# Patient Record
Sex: Female | Born: 1965 | Race: White | Hispanic: No | Marital: Married | Smoking: Never smoker
Health system: Southern US, Community
[De-identification: ages and names within clinical notes are randomized; demographics above are authoritative.]

## PROBLEM LIST (undated history)

## (undated) DIAGNOSIS — E785 Hyperlipidemia, unspecified: Secondary | ICD-10-CM

## (undated) DIAGNOSIS — T7840XA Allergy, unspecified, initial encounter: Secondary | ICD-10-CM

## (undated) DIAGNOSIS — K219 Gastro-esophageal reflux disease without esophagitis: Secondary | ICD-10-CM

## (undated) DIAGNOSIS — J45909 Unspecified asthma, uncomplicated: Secondary | ICD-10-CM

## (undated) DIAGNOSIS — F419 Anxiety disorder, unspecified: Secondary | ICD-10-CM

## (undated) DIAGNOSIS — E119 Type 2 diabetes mellitus without complications: Secondary | ICD-10-CM

## (undated) DIAGNOSIS — I10 Essential (primary) hypertension: Secondary | ICD-10-CM

## (undated) DIAGNOSIS — D649 Anemia, unspecified: Secondary | ICD-10-CM

## (undated) DIAGNOSIS — U071 COVID-19: Secondary | ICD-10-CM

## (undated) HISTORY — DX: Essential (primary) hypertension: I10

## (undated) HISTORY — DX: Hyperlipidemia, unspecified: E78.5

## (undated) HISTORY — DX: Anemia, unspecified: D64.9

## (undated) HISTORY — DX: Type 2 diabetes mellitus without complications: E11.9

## (undated) HISTORY — DX: Anxiety disorder, unspecified: F41.9

## (undated) HISTORY — DX: Allergy, unspecified, initial encounter: T78.40XA

## (undated) HISTORY — DX: Gastro-esophageal reflux disease without esophagitis: K21.9

## (undated) HISTORY — PX: GANGLION CYST EXCISION: SHX1691

## (undated) HISTORY — DX: Unspecified asthma, uncomplicated: J45.909

---
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
## 1991-07-11 HISTORY — PX: TUBAL LIGATION: SHX77

## 2001-06-30 ENCOUNTER — Emergency Department (HOSPITAL_COMMUNITY): Admission: EM | Admit: 2001-06-30 | Discharge: 2001-06-30 | Payer: Self-pay | Admitting: Emergency Medicine

## 2001-06-30 ENCOUNTER — Encounter: Payer: Self-pay | Admitting: Emergency Medicine

## 2003-06-10 HISTORY — PX: OTHER SURGICAL HISTORY: SHX169

## 2003-07-01 HISTORY — PX: OTHER SURGICAL HISTORY: SHX169

## 2003-10-26 ENCOUNTER — Encounter: Payer: Self-pay | Admitting: Family Medicine

## 2003-10-26 LAB — CONVERTED CEMR LAB
Hgb A1c MFr Bld: 5.8 %
Microalbumin U total vol: 6 mg/L

## 2003-10-29 ENCOUNTER — Encounter: Payer: Self-pay | Admitting: Family Medicine

## 2003-10-29 ENCOUNTER — Other Ambulatory Visit: Admission: RE | Admit: 2003-10-29 | Discharge: 2003-10-29 | Payer: Self-pay | Admitting: Family Medicine

## 2003-10-29 LAB — CONVERTED CEMR LAB: Pap Smear: NORMAL

## 2003-11-10 ENCOUNTER — Ambulatory Visit (HOSPITAL_COMMUNITY): Admission: RE | Admit: 2003-11-10 | Discharge: 2003-11-10 | Payer: Self-pay | Admitting: Family Medicine

## 2004-05-30 ENCOUNTER — Ambulatory Visit: Payer: Self-pay | Admitting: Family Medicine

## 2004-06-10 ENCOUNTER — Ambulatory Visit: Payer: Self-pay | Admitting: Family Medicine

## 2004-07-12 ENCOUNTER — Ambulatory Visit: Payer: Self-pay | Admitting: Family Medicine

## 2004-09-12 ENCOUNTER — Ambulatory Visit: Payer: Self-pay | Admitting: Family Medicine

## 2004-10-26 ENCOUNTER — Ambulatory Visit: Payer: Self-pay | Admitting: Family Medicine

## 2004-10-26 LAB — CONVERTED CEMR LAB
Hgb A1c MFr Bld: 5.1 %
Microalbumin U total vol: 6.4 mg/L

## 2004-11-07 ENCOUNTER — Ambulatory Visit: Payer: Self-pay | Admitting: Family Medicine

## 2004-11-07 ENCOUNTER — Other Ambulatory Visit: Admission: RE | Admit: 2004-11-07 | Discharge: 2004-11-07 | Payer: Self-pay | Admitting: Family Medicine

## 2004-11-15 ENCOUNTER — Ambulatory Visit (HOSPITAL_COMMUNITY): Admission: RE | Admit: 2004-11-15 | Discharge: 2004-11-15 | Payer: Self-pay | Admitting: Family Medicine

## 2004-12-27 ENCOUNTER — Ambulatory Visit: Payer: Self-pay | Admitting: Family Medicine

## 2005-05-09 ENCOUNTER — Ambulatory Visit: Payer: Self-pay | Admitting: Family Medicine

## 2005-05-12 ENCOUNTER — Ambulatory Visit: Payer: Self-pay | Admitting: Family Medicine

## 2005-11-07 ENCOUNTER — Ambulatory Visit: Payer: Self-pay | Admitting: Family Medicine

## 2005-11-07 LAB — CONVERTED CEMR LAB
Hgb A1c MFr Bld: 5.5 %
Microalbumin U total vol: 3.6 mg/L

## 2005-11-08 ENCOUNTER — Encounter: Payer: Self-pay | Admitting: Family Medicine

## 2005-11-08 ENCOUNTER — Other Ambulatory Visit: Admission: RE | Admit: 2005-11-08 | Discharge: 2005-11-08 | Payer: Self-pay | Admitting: Family Medicine

## 2005-11-08 ENCOUNTER — Ambulatory Visit: Payer: Self-pay | Admitting: Family Medicine

## 2005-11-08 LAB — CONVERTED CEMR LAB: Pap Smear: NORMAL

## 2005-12-22 ENCOUNTER — Ambulatory Visit: Payer: Self-pay | Admitting: Family Medicine

## 2006-01-09 ENCOUNTER — Ambulatory Visit (HOSPITAL_COMMUNITY): Admission: RE | Admit: 2006-01-09 | Discharge: 2006-01-09 | Payer: Self-pay | Admitting: Family Medicine

## 2006-02-19 ENCOUNTER — Ambulatory Visit: Payer: Self-pay | Admitting: Family Medicine

## 2006-02-21 ENCOUNTER — Ambulatory Visit: Payer: Self-pay | Admitting: Family Medicine

## 2006-05-16 ENCOUNTER — Ambulatory Visit: Payer: Self-pay | Admitting: Family Medicine

## 2006-05-28 HISTORY — PX: OTHER SURGICAL HISTORY: SHX169

## 2006-10-25 ENCOUNTER — Encounter: Payer: Self-pay | Admitting: Family Medicine

## 2006-10-25 DIAGNOSIS — E119 Type 2 diabetes mellitus without complications: Secondary | ICD-10-CM

## 2006-10-25 DIAGNOSIS — E118 Type 2 diabetes mellitus with unspecified complications: Secondary | ICD-10-CM | POA: Insufficient documentation

## 2006-10-25 DIAGNOSIS — E1165 Type 2 diabetes mellitus with hyperglycemia: Secondary | ICD-10-CM | POA: Insufficient documentation

## 2006-11-13 ENCOUNTER — Ambulatory Visit: Payer: Self-pay | Admitting: Family Medicine

## 2006-11-13 LAB — CONVERTED CEMR LAB
Albumin: 3.8 g/dL (ref 3.5–5.2)
BUN: 11 mg/dL (ref 6–23)
Bilirubin, Direct: 0.1 mg/dL (ref 0.0–0.3)
CO2: 28 meq/L (ref 19–32)
Calcium: 9.1 mg/dL (ref 8.4–10.5)
Creatinine, Ser: 0.7 mg/dL (ref 0.4–1.2)
Creatinine,U: 28.5 mg/dL
HCT: 37.1 % (ref 36.0–46.0)
HDL: 49.6 mg/dL (ref >39.0)
Hemoglobin: 12.9 g/dL (ref 12.0–15.0)
Hgb A1c MFr Bld: 5.5 % (ref 4.6–6.0)
LDL Cholesterol: 89 mg/dL (ref 0–99)
MCV: 86 fL (ref 78.0–100.0)
Microalb, Ur: 0.4 mg/dL (ref 0.0–1.9)
Platelets: 228 10*3/uL (ref 150–400)
RBC: 4.31 M/uL (ref 3.87–5.11)
Total Protein: 6.5 g/dL (ref 6.0–8.3)
Triglycerides: 64 mg/dL (ref 0–149)
VLDL: 13 mg/dL (ref 0–40)
WBC: 5.5 10*3/uL (ref 4.5–10.5)

## 2006-11-16 ENCOUNTER — Ambulatory Visit: Payer: Self-pay | Admitting: Family Medicine

## 2006-11-16 DIAGNOSIS — T7840XA Allergy, unspecified, initial encounter: Secondary | ICD-10-CM

## 2006-11-16 DIAGNOSIS — E0590 Thyrotoxicosis, unspecified without thyrotoxic crisis or storm: Secondary | ICD-10-CM | POA: Insufficient documentation

## 2006-11-16 DIAGNOSIS — J302 Other seasonal allergic rhinitis: Secondary | ICD-10-CM | POA: Insufficient documentation

## 2006-11-23 ENCOUNTER — Emergency Department (HOSPITAL_COMMUNITY): Admission: EM | Admit: 2006-11-23 | Discharge: 2006-11-23 | Payer: Self-pay | Admitting: Family Medicine

## 2006-12-26 ENCOUNTER — Encounter (INDEPENDENT_AMBULATORY_CARE_PROVIDER_SITE_OTHER): Payer: Self-pay | Admitting: *Deleted

## 2007-03-15 ENCOUNTER — Ambulatory Visit: Payer: Self-pay | Admitting: Family Medicine

## 2007-03-22 ENCOUNTER — Telehealth: Payer: Self-pay | Admitting: Family Medicine

## 2007-04-23 ENCOUNTER — Ambulatory Visit: Payer: Self-pay | Admitting: Family Medicine

## 2007-04-30 ENCOUNTER — Ambulatory Visit: Payer: Self-pay | Admitting: General Practice

## 2007-04-30 ENCOUNTER — Encounter: Payer: Self-pay | Admitting: Family Medicine

## 2007-05-07 ENCOUNTER — Encounter: Payer: Self-pay | Admitting: Family Medicine

## 2007-05-09 ENCOUNTER — Ambulatory Visit: Payer: Self-pay | Admitting: Family Medicine

## 2007-05-14 ENCOUNTER — Ambulatory Visit: Payer: Self-pay | Admitting: General Practice

## 2007-05-20 ENCOUNTER — Telehealth (INDEPENDENT_AMBULATORY_CARE_PROVIDER_SITE_OTHER): Payer: Self-pay | Admitting: *Deleted

## 2007-06-13 ENCOUNTER — Ambulatory Visit: Payer: Self-pay | Admitting: Family Medicine

## 2007-06-13 ENCOUNTER — Encounter: Payer: Self-pay | Admitting: Family Medicine

## 2007-06-13 ENCOUNTER — Other Ambulatory Visit: Admission: RE | Admit: 2007-06-13 | Discharge: 2007-06-13 | Payer: Self-pay | Admitting: Family Medicine

## 2007-06-13 LAB — CONVERTED CEMR LAB
Bacteria, UA: 0
Bilirubin Urine: NEGATIVE

## 2007-06-18 ENCOUNTER — Encounter (INDEPENDENT_AMBULATORY_CARE_PROVIDER_SITE_OTHER): Payer: Self-pay | Admitting: *Deleted

## 2007-08-01 ENCOUNTER — Encounter: Payer: Self-pay | Admitting: Family Medicine

## 2007-10-22 ENCOUNTER — Telehealth: Payer: Self-pay | Admitting: Family Medicine

## 2007-11-06 ENCOUNTER — Ambulatory Visit: Payer: Self-pay | Admitting: Family Medicine

## 2007-11-06 ENCOUNTER — Encounter (INDEPENDENT_AMBULATORY_CARE_PROVIDER_SITE_OTHER): Payer: Self-pay | Admitting: Internal Medicine

## 2007-11-07 ENCOUNTER — Encounter (INDEPENDENT_AMBULATORY_CARE_PROVIDER_SITE_OTHER): Payer: Self-pay | Admitting: Internal Medicine

## 2007-11-07 ENCOUNTER — Telehealth (INDEPENDENT_AMBULATORY_CARE_PROVIDER_SITE_OTHER): Payer: Self-pay | Admitting: Internal Medicine

## 2007-11-12 ENCOUNTER — Ambulatory Visit: Payer: Self-pay | Admitting: Family Medicine

## 2007-11-18 ENCOUNTER — Telehealth: Payer: Self-pay | Admitting: Family Medicine

## 2008-02-27 ENCOUNTER — Ambulatory Visit: Payer: Self-pay | Admitting: Family Medicine

## 2008-03-26 ENCOUNTER — Ambulatory Visit: Payer: Self-pay | Admitting: Family Medicine

## 2008-03-27 ENCOUNTER — Telehealth: Payer: Self-pay | Admitting: Family Medicine

## 2008-03-31 ENCOUNTER — Telehealth (INDEPENDENT_AMBULATORY_CARE_PROVIDER_SITE_OTHER): Payer: Self-pay | Admitting: *Deleted

## 2008-06-16 ENCOUNTER — Telehealth: Payer: Self-pay | Admitting: Family Medicine

## 2008-08-03 ENCOUNTER — Encounter: Payer: Self-pay | Admitting: Family Medicine

## 2008-09-16 ENCOUNTER — Ambulatory Visit: Payer: Self-pay | Admitting: Family Medicine

## 2008-09-17 ENCOUNTER — Encounter (INDEPENDENT_AMBULATORY_CARE_PROVIDER_SITE_OTHER): Payer: Self-pay | Admitting: *Deleted

## 2008-09-17 ENCOUNTER — Telehealth (INDEPENDENT_AMBULATORY_CARE_PROVIDER_SITE_OTHER): Payer: Self-pay | Admitting: *Deleted

## 2008-09-23 ENCOUNTER — Encounter: Payer: Self-pay | Admitting: Family Medicine

## 2008-12-08 ENCOUNTER — Ambulatory Visit: Payer: Self-pay | Admitting: Family Medicine

## 2008-12-08 LAB — CONVERTED CEMR LAB
ALT: 18 units/L (ref 0–35)
AST: 22 units/L (ref 0–37)
Albumin: 3.9 g/dL (ref 3.5–5.2)
Alkaline Phosphatase: 55 units/L (ref 39–117)
BUN: 10 mg/dL (ref 6–23)
Basophils Absolute: 0 10*3/uL (ref 0.0–0.1)
Bilirubin, Direct: 0.1 mg/dL (ref 0.0–0.3)
CO2: 27 meq/L (ref 19–32)
Calcium: 8.7 mg/dL (ref 8.4–10.5)
Chloride: 109 meq/L (ref 96–112)
Cholesterol: 168 mg/dL (ref 0–200)
Creatinine, Ser: 0.7 mg/dL (ref 0.4–1.2)
Creatinine,U: 40.5 mg/dL
Eosinophils Absolute: 0.1 10*3/uL (ref 0.0–0.7)
Eosinophils Relative: 1.3 % (ref 0.0–5.0)
GFR calc non Af Amer: 96.36 mL/min (ref >60)
HCT: 32.2 % — ABNORMAL LOW (ref 36.0–46.0)
HDL: 55.5 mg/dL (ref >39.00)
Hgb A1c MFr Bld: 6.3 % (ref 4.6–6.5)
Iron: 22 ug/dL — ABNORMAL LOW (ref 42–145)
LDL Cholesterol: 93 mg/dL (ref 0–99)
Lymphocytes Relative: 20.8 % (ref 12.0–46.0)
Microalb Creat Ratio: 7.4 mg/g (ref 0.0–30.0)
Microalb, Ur: 0.3 mg/dL (ref 0.0–1.9)
Potassium: 4 meq/L (ref 3.5–5.1)
RDW: 16.9 % — ABNORMAL HIGH (ref 11.5–14.6)
Total Protein: 7.1 g/dL (ref 6.0–8.3)
Triglycerides: 97 mg/dL (ref 0.0–149.0)
Vitamin B-12: 339 pg/mL (ref 211–911)

## 2008-12-10 ENCOUNTER — Ambulatory Visit: Payer: Self-pay | Admitting: Family Medicine

## 2008-12-10 ENCOUNTER — Other Ambulatory Visit: Admission: RE | Admit: 2008-12-10 | Discharge: 2008-12-10 | Payer: Self-pay | Admitting: Family Medicine

## 2008-12-10 ENCOUNTER — Encounter: Payer: Self-pay | Admitting: Family Medicine

## 2008-12-14 ENCOUNTER — Encounter (INDEPENDENT_AMBULATORY_CARE_PROVIDER_SITE_OTHER): Payer: Self-pay | Admitting: *Deleted

## 2008-12-16 ENCOUNTER — Encounter: Payer: Self-pay | Admitting: Family Medicine

## 2008-12-16 ENCOUNTER — Ambulatory Visit: Payer: Self-pay | Admitting: Family Medicine

## 2008-12-16 LAB — HM MAMMOGRAPHY

## 2008-12-28 ENCOUNTER — Encounter: Payer: Self-pay | Admitting: Family Medicine

## 2008-12-28 ENCOUNTER — Ambulatory Visit: Payer: Self-pay | Admitting: Family Medicine

## 2009-01-05 ENCOUNTER — Encounter (INDEPENDENT_AMBULATORY_CARE_PROVIDER_SITE_OTHER): Payer: Self-pay | Admitting: *Deleted

## 2009-02-11 ENCOUNTER — Telehealth: Payer: Self-pay | Admitting: Family Medicine

## 2009-03-04 ENCOUNTER — Ambulatory Visit: Payer: Self-pay | Admitting: Family Medicine

## 2009-03-04 DIAGNOSIS — R609 Edema, unspecified: Secondary | ICD-10-CM | POA: Insufficient documentation

## 2009-06-10 ENCOUNTER — Ambulatory Visit: Payer: Self-pay | Admitting: Family Medicine

## 2009-06-10 DIAGNOSIS — E559 Vitamin D deficiency, unspecified: Secondary | ICD-10-CM | POA: Insufficient documentation

## 2009-06-11 LAB — CONVERTED CEMR LAB: Vit D, 25-Hydroxy: 37 ng/mL (ref 30–89)

## 2009-06-14 ENCOUNTER — Ambulatory Visit: Payer: Self-pay | Admitting: Family Medicine

## 2009-08-02 ENCOUNTER — Ambulatory Visit: Payer: Self-pay | Admitting: Family Medicine

## 2009-08-02 LAB — CONVERTED CEMR LAB
Creatinine, Ser: 0.7 mg/dL (ref 0.4–1.2)
Potassium: 3.5 meq/L (ref 3.5–5.1)

## 2009-10-14 ENCOUNTER — Telehealth: Payer: Self-pay | Admitting: Family Medicine

## 2009-12-13 ENCOUNTER — Ambulatory Visit: Payer: Self-pay | Admitting: Family Medicine

## 2009-12-13 LAB — CONVERTED CEMR LAB
Albumin: 4 g/dL (ref 3.5–5.2)
Basophils Absolute: 0 10*3/uL (ref 0.0–0.1)
Basophils Relative: 0.4 % (ref 0.0–3.0)
Bilirubin, Direct: 0.1 mg/dL (ref 0.0–0.3)
CO2: 27 meq/L (ref 19–32)
Creatinine, Ser: 0.7 mg/dL (ref 0.4–1.2)
Eosinophils Absolute: 0.1 10*3/uL (ref 0.0–0.7)
Free T4: 0.9 ng/dL (ref 0.6–1.6)
HCT: 36.5 % (ref 36.0–46.0)
HDL: 46 mg/dL (ref >39.00)
Lymphocytes Relative: 24.5 % (ref 12.0–46.0)
Lymphs Abs: 1.5 10*3/uL (ref 0.7–4.0)
MCV: 89.6 fL (ref 78.0–100.0)
Microalb Creat Ratio: 0.7 mg/g (ref 0.0–30.0)
Monocytes Relative: 9 % (ref 3.0–12.0)
Neutro Abs: 3.9 10*3/uL (ref 1.4–7.7)
Neutrophils Relative %: 64.7 % (ref 43.0–77.0)
Phosphorus: 4.3 mg/dL (ref 2.3–4.6)
RBC: 4.08 M/uL (ref 3.87–5.11)
Sodium: 141 meq/L (ref 135–145)
TSH: 1 microintl units/mL (ref 0.35–5.50)
Total CHOL/HDL Ratio: 3

## 2009-12-14 LAB — CONVERTED CEMR LAB: Vit D, 25-Hydroxy: 54 ng/mL (ref 30–89)

## 2009-12-27 ENCOUNTER — Ambulatory Visit: Payer: Self-pay | Admitting: Family Medicine

## 2009-12-27 ENCOUNTER — Other Ambulatory Visit: Admission: RE | Admit: 2009-12-27 | Discharge: 2009-12-27 | Payer: Self-pay | Admitting: Family Medicine

## 2009-12-27 DIAGNOSIS — N852 Hypertrophy of uterus: Secondary | ICD-10-CM | POA: Insufficient documentation

## 2009-12-27 DIAGNOSIS — R55 Syncope and collapse: Secondary | ICD-10-CM | POA: Insufficient documentation

## 2009-12-27 LAB — CONVERTED CEMR LAB: Pap Smear: NORMAL

## 2009-12-30 ENCOUNTER — Encounter (INDEPENDENT_AMBULATORY_CARE_PROVIDER_SITE_OTHER): Payer: Self-pay | Admitting: *Deleted

## 2010-01-21 ENCOUNTER — Encounter: Admission: RE | Admit: 2010-01-21 | Discharge: 2010-01-21 | Payer: Self-pay | Admitting: Family Medicine

## 2010-02-15 ENCOUNTER — Encounter (INDEPENDENT_AMBULATORY_CARE_PROVIDER_SITE_OTHER): Payer: Self-pay | Admitting: *Deleted

## 2010-05-03 ENCOUNTER — Ambulatory Visit: Payer: Self-pay | Admitting: General Practice

## 2010-07-31 ENCOUNTER — Encounter: Payer: Self-pay | Admitting: Family Medicine

## 2010-08-11 NOTE — Letter (Signed)
Summary: Results Follow up Letter  [place] at [facility]  [address] [place], [place] [address]   Phone: [phone]  Fax: [phone]    12/30/2009 MRN: [id number]  Kelly Reed [address] [place], [place]  [address]  Dear [name],  The following are the results of your recent test(s):  Test         Result    Pap Smear:        Normal _X____  Not Normal _____ Comments: Please repeat in one year. ______________________________________________________ Cholesterol: LDL(Bad cholesterol):         Your goal is less than:         HDL (Good cholesterol):       Your goal is more than: Comments:  ______________________________________________________ Mammogram:        Normal _____  Not Normal _____ Comments:  ___________________________________________________________________ Hemoccult:        Normal _____  Not normal _______ Comments:    _____________________________________________________________________ Other Tests:    We routinely do not discuss normal results over the telephone.  If you desire a copy of the results, or you have any questions about this information we can discuss them at your next office visit.   Sincerely,    Laurita Quint, MD

## 2010-08-11 NOTE — Progress Notes (Signed)
Summary: Rx Buspirone  Phone Note Refill Request Call back at [phone] Message from:  [facility] on October 14, 2009 8:47 AM  Refills Requested: Medication #1:  BUSPIRONE HCL 15 MG TABS 1/2 tab by mouth two times a day   Last Refilled: 09/11/2009 Received faxed refill request, please advise.   Method Requested: Electronic Initial call taken by: Linde Gillis CMA Duncan Dull),  October 14, 2009 8:47 AM  Follow-up for Phone Call        Rx called to pharmacy Follow-up by: Linde Gillis CMA Duncan Dull),  October 14, 2009 10:31 AM    Prescriptions: BUSPIRONE HCL 15 MG TABS (BUSPIRONE HCL) 1/2 tab by mouth two times a day  #30 x 6   Entered and Authorized by:   Shaune Leeks MD   Signed by:   Shaune Leeks MD on 10/14/2009   Method used:   Telephoned to ...       [facility]* (retail)       [address]       [place], [place]  [address]       Ph: [phone]       Fax: [phone]   RxID:   [id number]

## 2010-08-11 NOTE — Assessment & Plan Note (Signed)
Summary: cpx w/pap/rbh   Vital Signs:  Patient profile:   45 year old female Weight:      229.25 pounds BMI:     33.01 Temp:     98.7 degrees F oral Pulse rate:   92 / minute Pulse rhythm:   regular BP sitting:   120 / 88  (left arm) Cuff size:   large  Vitals Entered By: Sydell Axon LPN (December 27, 2009 10:53 AM) CC: 30 Minute checkup with pap   History of Present Illness: Pt here with Comp Exam. She deals with it daily. She gets stress from work and family. She had diarrhea all week last week. Then also had bad Friday  Has PA at work and suggested Allegra D.   Preventive Screening-Counseling & Management  Alcohol-Tobacco     Alcohol drinks/day: <1     Alcohol type: beer 1-2 a week     Smoking Status: never     Passive Smoke Exposure: yes  Caffeine-Diet-Exercise     Caffeine use/day: 3     Does Patient Exercise: yes     Type of exercise: walks     Times/week: 7  Problems Prior to Update: 1)  Unspecified Vitamin D Deficiency  (ICD-268.9) 2)  Sinusitis, Chronic w/ Allergies  (ICD-473.9) 3)  Edema  (ICD-782.3) 4)  Anxiety State, Unspec  (ICD-300.00) 5)  Other Screening Mammogram  (ICD-V76.12) 6)  Health Maintenance Exam  (ICD-V70.0) 7)  Allergy  (ICD-995.3) 8)  Hyperthyroidism  (ICD-242.90) 9)  Sprain/strain, R Shoulder/arm Nos  (ICD-840.9) 10)  Anemia, Iron Deficiency Sec. Heavy Flow  (ICD-280.9) 11)  Breast Cancer, Family Hx, Mother Died Age 37 Breast Ca.  (ICD-V16.3) 12)  Hypercholesterolemia, Ldl 110  (ICD-272.0) 13)  Diabetes Mellitus, Type II  (ICD-250.00)  Medications Prior to Update: 1)  Pravachol 40 Mg Tabs (Pravastatin Sodium) .... Take 1 Tab By Mouth At Bedtime 2)  Sertraline Hcl 50 Mg Tabs (Sertraline Hcl) .Marland Kitchen.. 1 Tablet Once Daily By Mouth 3)  Feosol 45 Mg  Tabs (Iron) .... 3 Pills At Bedtime By Mouth 4)  Stool Softener 100 Mg  Caps (Docusate Sodium) .... 3 Pills At Night By Mouth 5)  Zyrtec Allergy 10 Mg  Tabs (Cetirizine Hcl) .... Take 1  Tablet By Mouth Once A Day As Needed 6)  Triamcinolone Acetonide 0.1 %  Crea (Triamcinolone Acetonide) .... Apply To Affected Area Two Times As Needed 7)  Metformin Hcl 500 Mg Tabs (Metformin Hcl) .Marland Kitchen.. 1 Tab  By Mouth Daily 8)  Flonase 50 Mcg/act Susp (Fluticasone Propionate) .... One Squirt Each Nosrtil Once Daily 9)  Buspirone Hcl 15 Mg Tabs (Buspirone Hcl) .... 1/2 Tab By Mouth Two Times A Day 10)  Vitamin D3 1000 Unit Caps (Cholecalciferol) .... 2 Capsules At Bedtime 11)  Flexeril 5 Mg Tabs (Cyclobenzaprine Hcl) .... Take 1/2 To 1 By Mouth Three Times A Day As Needed 12)  Meclizine Hcl 25 Mg Tabs (Meclizine Hcl) .... Take 1 By Mouth Evey 6 Hours As Needed 13)  Lisinopril 2.5 Mg Tabs (Lisinopril) .... One Tab By Mouth At Night  Allergies: No Known Drug Allergies  Past History:  Past Medical History: Last updated: 10/25/2006 Diabetes mellitus, type II  Past Surgical History: Last updated: 10/25/2006 NSVD x 2 BTL 06/1992 Abd. U/S negative 12/04 Hepatobiliary- ? biliary dyskinesia 07/01/2003 Surg. eval. lap cholecstectomy- deferred by pt. 05/28/2006  Family History: Last updated: 12/10/2008 Father dec Suicide prior to pt's birth Mother dec Breast Ca Brother A 47  Social History: Last updated: 10/25/2006 Occupation: [employer] Dept[employer] Married  2 Sons Never Smoked Alcohol use-no Drug use-no  Risk Factors: Alcohol Use: <1 (12/27/2009) Caffeine Use: 3 (12/27/2009) Exercise: yes (12/27/2009)  Risk Factors: Smoking Status: never (12/27/2009) Passive Smoke Exposure: yes (12/27/2009)  Social History: Caffeine use/day:  3  Review of Systems General:  Denies chills, fatigue, fever, sweats, weakness, and weight loss. Eyes:  Denies blurring, eye pain, and itching. ENT:  Denies decreased hearing, earache, and ringing in ears; fluid in ears recently.. CV:  Denies bluish discoloration of lips or nails, chest pain or discomfort, difficulty breathing at night,  difficulty breathing while lying down, fainting, fatigue, leg cramps with exertion, lightheadness, near fainting, palpitations, shortness of breath with exertion, swelling of feet, swelling of hands, and weight gain. Resp:  Denies cough, shortness of breath, and wheezing. GI:  Complains of diarrhea; denies abdominal pain, bloody stools, change in bowel habits, dark tarry stools, hemorrhoids, indigestion, nausea, vomiting, and vomiting blood. GU:  Denies discharge, nocturia, and urinary frequency. MS:  Complains of low back pain and muscle aches; denies joint pain and stiffness. Derm:  Complains of rash; denies dryness and itching; rhus. Neuro:  Denies numbness, poor balance, tingling, and tremors.  Physical Exam  General:  Well-developed,well-nourished,in no acute distress; alert,appropriate and cooperative throughout examination Head:  Normocephalic and atraumatic without obvious abnormalities. No apparent alopecia or balding. Mild max sinus pressure with palpation. Sinuses minimally tender. Eyes:  Conjunctiva clear bilaterally.  Ears:  External ear exam shows no significant lesions or deformities.  Otoscopic examination reveals clear canals, tympanic membranes are intact bilaterally without bulging, retraction, inflammation or discharge. Hearing is grossly normal bilaterally. NML LR. Nose:  External nasal examination shows no deformity or inflammation. Nasal mucosa are pink and moist without lesions or exudates. Mouth:  Oral mucosa and oropharynx without lesions or exudates.  Teeth in good repair. Neck:  No deformities, masses, or tenderness noted. Chest Wall:  No deformities, masses, or tenderness noted. Breasts:  No mass, nodules, thickening, tenderness, bulging, retraction, inflamation, nipple discharge or skin changes noted.   Lungs:  Normal respiratory effort, chest expands symmetrically. Lungs are clear to auscultation, no crackles or wheezes. Heart:  Normal rate and regular rhythm. S1  and S2 normal without gallop, murmur, click, rub or other extra sounds. Abdomen:  Bowel sounds positive,abdomen soft and non-tender without masses, organomegaly or hernias noted except mild tympany with mild tenderness in LLQ. Rectal:  Neglected to do this year. Genitalia:  Pelvic Exam:        External: normal female genitalia without lesions or masses        Vagina: normal without lesions or masses        Cervix: normal without lesions or masses        Adnexa: normal bimanual exam without masses or fullness        Uterus: Feels full, slightly enlarged and left sided by palpation        Pap smear: performed Msk:  No deformity or scoliosis noted of thoracic or lumbar spine.  Mild tenderness to direct deep palpation in L/S vertebral area. Pulses:  R and L carotid,radial,femoral,dorsalis pedis and posterior tibial pulses are full and equal bilaterally Extremities:  No clubbing, cyanosis, edema, or deformity noted with normal full range of motion of all joints except slightly decr ROM of R hip with occas catching and pain with prolonged positions. No edema today of ankles or feet.  Muscle spasm of legs bilat with  lordotic position. Neurologic:  No cranial nerve deficits noted. Station and gait are normal. Sensory, motor and coordinative functions appear intact. Skin:  Intact without suspicious lesions or rashes except vessicular linear minimally red rash along the left middle palm major wrinkle line. Cervical Nodes:  No lymphadenopathy noted Axillary Nodes:  No palpable lymphadenopathy Inguinal Nodes:  No significant adenopathy Psych:  Cognition and judgment appear intact. Alert and cooperative with normal attention span and concentration. No apparent delusions, illusions, hallucinations. Good affect and eye contact.   Impression & Recommendations:  Problem # 1:  HEALTH MAINTENANCE EXAM (ICD-V70.0) Assessment Comment Only Discussed diet, exercise, anxiety, health maintenance and  lifestyle.  Problem # 2:  UTERINE ENLARGEMENT (ICD-621.2) Assessment: New Will get U/s to assess. Orders: Radiology Referral (Radiology)  Problem # 3:  UNSPECIFIED VITAMIN D DEFICIENCY (ICD-268.9) Assessment: Improved Good nos on current replacement, continue.  Problem # 4:  SINUSITIS, CHRONIC W/ ALLERGIES (ICD-473.9) Assessment: Deteriorated  Again bothering her and need to improve her sxs and medication coverage. Does not appear infectious. Will refer to Allergy for eval and trmt. Add Astelin via sample fotr one month. Her updated medication list for this problem includes:    Flonase 50 Mcg/act Susp (Fluticasone propionate) ..... One squirt each nosrtil once daily    Allegra-d 24 Hour 180-240 Mg Xr24h-tab (Fexofenadine-pseudoephedrine) .Marland Kitchen... Take one by mouth daily as needed  Problem # 5:  RHUS DERMATITIS (ICD-692.6) Assessment: New Start Clobetasol, apply two times a day. The following medications were removed from the medication list:    Zyrtec Allergy 10 Mg Tabs (Cetirizine hcl) .Marland Kitchen... Take 1 tablet by mouth once a day as needed Her updated medication list for this problem includes:    Triamcinolone Acetonide 0.1 % Crea (Triamcinolone acetonide) .Marland Kitchen... Apply to affected area two times as needed    Clobetasol Propionate 0.05 % Oint (Clobetasol propionate) .Marland Kitchen... Apply to affected area two times a day  Problem # 6:  EDEMA (ICD-782.3) Assessment: Unchanged Stable.  Problem # 7:  ANXIETY STATE, UNSPEC (ICD-300.00)  Get back on Sertraline 50mg  a day and increase Buspirone to 1/2 three times a day from two times a day. RTC 3 mos if sxs no better. Her updated medication list for this problem includes:    Sertraline Hcl 50 Mg Tabs (Sertraline hcl) .Marland Kitchen... 1 tablet once daily by mouth    Buspirone Hcl 15 Mg Tabs (Buspirone hcl) .Marland Kitchen... 1/2 tab by mouth three  times a day  Discussed medication use.   Problem # 8:  HYPERTHYROIDISM (ICD-242.90) Assessment: Unchanged Stable, cont curr  dose. Labs Reviewed: TSH: 1.00 (12/13/2009)     Problem # 9:  ANEMIA, IRON DEFICIENCY SEC. HEAVY FLOW (ICD-280.9) Assessment: Unchanged Stable. Her updated medication list for this problem includes:    Feosol 45 Mg Tabs (Iron) .Marland KitchenMarland KitchenMarland KitchenMarland Kitchen 3 pills at bedtime by mouth  Hgb: 12.8 (12/13/2009)   Hct: 36.5 (12/13/2009)   Platelets: 212.0 (12/13/2009) RBC: 4.08 (12/13/2009)   RDW: 13.3 (12/13/2009)   WBC: 6.0 (12/13/2009) MCV: 89.6 (12/13/2009)   MCHC: 35.1 (12/13/2009) Ferritin: 7.4 (11/13/2006) Iron: 22 (12/08/2008)   B12: 339 (12/08/2008)   TSH: 1.00 (12/13/2009)  Problem # 10:  HYPERCHOLESTEROLEMIA, LDL 110 (ICD-272.0) Assessment: Unchanged Good control, cont Prava. Her updated medication list for this problem includes:    Pravachol 40 Mg Tabs (Pravastatin sodium) .Marland Kitchen... Take 1 tab by mouth at bedtime  Labs Reviewed: SGOT: 20 (12/13/2009)   SGPT: 19 (12/13/2009)   HDL:46.00 (12/13/2009), 55.50 (12/08/2008)  LDL:83 (12/13/2009), 93 (12/08/2008)  Chol:157 (12/13/2009), 168 (12/08/2008)  Trig:142.0 (12/13/2009), 97.0 (12/08/2008)  Problem # 11:  DIABETES MELLITUS, TYPE II (ICD-250.00) Assessment: Unchanged Stable. Her updated medication list for this problem includes:    Metformin Hcl 500 Mg Tabs (Metformin hcl) .Marland Kitchen... 1 tab  by mouth daily    Lisinopril 2.5 Mg Tabs (Lisinopril) ..... One tab by mouth at night  Labs Reviewed: Creat: 0.7 (12/13/2009)   Microalbumin: 3.6 (11/07/2005)  Last Eye Exam: normal (08/03/2008) Reviewed HgBA1c results: 6.4 (12/13/2009)  5.8 (06/10/2009)  Complete Medication List: 1)  Pravachol 40 Mg Tabs (Pravastatin sodium) .... Take 1 tab by mouth at bedtime 2)  Sertraline Hcl 50 Mg Tabs (Sertraline hcl) .Marland Kitchen.. 1 tablet once daily by mouth 3)  Feosol 45 Mg Tabs (Iron) .... 3 pills at bedtime by mouth 4)  Stool Softener 100 Mg Caps (docusate Sodium)  .... 3 pills at night by mouth 5)  Triamcinolone Acetonide 0.1 % Crea (Triamcinolone acetonide) .... Apply to  affected area two times as needed 6)  Metformin Hcl 500 Mg Tabs (Metformin hcl) .Marland Kitchen.. 1 tab  by mouth daily 7)  Flonase 50 Mcg/act Susp (Fluticasone propionate) .... One squirt each nosrtil once daily 8)  Buspirone Hcl 15 Mg Tabs (Buspirone hcl) .... 1/2 tab by mouth three  times a day 9)  Vitamin D3 1000 Unit Caps (Cholecalciferol) .... 2 capsules at bedtime 10)  Flexeril 5 Mg Tabs (Cyclobenzaprine hcl) .... Take 1/2 to 1 by mouth three times a day as needed 11)  Meclizine Hcl 25 Mg Tabs (Meclizine hcl) .... Take 1 by mouth evey 6 hours as needed 12)  Lisinopril 2.5 Mg Tabs (Lisinopril) .... One tab by mouth at night 13)  Allegra-d 24 Hour 180-240 Mg Xr24h-tab (Fexofenadine-pseudoephedrine) .... Take one by mouth daily as needed 14)  Clobetasol Propionate 0.05 % Oint (Clobetasol propionate) .... Apply to affected area two times a day  Patient Instructions: 1)  Refer to allergist. 2)  Refer for U/S for uterus Prescriptions: CLOBETASOL PROPIONATE 0.05 % OINT (CLOBETASOL PROPIONATE) apply to affected area two times a day  #60 gms x 0   Entered and Authorized by:   Shaune Leeks MD   Signed by:   Shaune Leeks MD on 12/27/2009   Method used:   Electronically to        [facility]* (retail)       [address]       [place], [place]  [address]       Ph: [phone]       Fax: [phone]   RxID:   [id number]   Current Allergies (reviewed today): No known allergies

## 2010-08-11 NOTE — Letter (Signed)
Summary: Kelly Reed letter  [place] at [facility]  [address] [place], [place] [address]   Phone: [phone]  Fax: [phone]       02/15/2010 MRN: [id number]  Kelly Reed [address] [place], [place]  [address]  Dear Ms. [facility] [place], and [place] announce the retirement of Arta Silence, M.D., from full-time practice at the [facility] office effective January 06, 2010 and his plans of returning part-time.  It is important to Dr. Hetty Ely and to our practice that you understand that [facility] has seven physicians in our office for your health care needs.  We will continue to offer the same exceptional care that you have today.    Dr. Hetty Ely has spoken to many of you about his plans for retirement and returning part-time in the fall.   We will continue to work with you through the transition to schedule appointments for you in the office and meet the high standards that [place] is committed to.   Again, it is with great pleasure that we share the news that Dr. Hetty Ely will return to [facility] in October of 2011 with a reduced schedule.    If you have any questions, or would like to request an appointment with one of our physicians, please call us at [phone] and press the option for Scheduling an appointment.  We take pleasure in providing you with excellent patient care and look forward to seeing you at your next office visit.  Our [facility] Physicians are:  Tillman Abide, M.D. Laurita Quint, M.D. Roxy Manns, M.D. Kerby Nora, M.D. Hannah Beat, M.D. Ruthe Mannan, M.D. We proudly welcomed Raechel Ache, M.D. and Eustaquio Boyden, M.D. to the practice in July/August 2011.  Sincerely,   [facility]

## 2010-11-25 NOTE — Letter (Signed)
May 16, 2006     [facility]  [address]  [place], [place]  [address]    RE:  Kelly Reed, Kelly Reed  MRN:  [id number]  /  DOB:  July 29, 1965   To Whom It May Concern:   This is to introduce Kelly Reed, a 45 year old white female who is having  low-grade fevers since she started her last period, which was approximately  1 month ago.  Has not felt sick, in general has felt pretty well otherwise.  Had right upper quadrant pain in December 2004, seen acutely by an acute  care clinic here, who did an abdominal ultrasound which was normal, followed  that with a HIDA scan, which showed sludge.  By watching her diet and  avoiding fatty foods, she has avoided symptoms since that time.  I am  wondering if she could have some cholestatic build-up that could be causing  her fevers.  The patient otherwise is healthy and her exam is normal.   She has high cholesterol since 2005, diabetes since December 2004, and iron-  deficiency anemia due to extremely heavy periods since onset as a 10-year-  old.   Medicines include Fortamet 500 mg a day, iron tablets 1-3 times a day, stool  softeners, generic Allegra 180 mg a day as needed, pravastatin 40 mg at  night, and Zoloft 50 mg a day.  She has  also been prescribed Xanax for her anxiety, which was treated initially  approximately 1 month ago, and has not used any as of this point.         I appreciate your seeing her.  She has no known drug allergies.  I look  forward to your evaluation.    Sincerely,     ______________________________  Arta Silence, MD    RNS/MedQ  DD: 05/16/2006  DT: 05/17/2006  Job #: [id number]

## 2011-01-14 ENCOUNTER — Encounter: Payer: Self-pay | Admitting: Family Medicine

## 2011-01-20 ENCOUNTER — Encounter: Payer: Self-pay | Admitting: Family Medicine

## 2011-01-20 ENCOUNTER — Ambulatory Visit (INDEPENDENT_AMBULATORY_CARE_PROVIDER_SITE_OTHER): Payer: BC Managed Care – PPO | Admitting: Family Medicine

## 2011-01-20 DIAGNOSIS — J329 Chronic sinusitis, unspecified: Secondary | ICD-10-CM

## 2011-01-20 MED ORDER — AZITHROMYCIN 250 MG PO TABS: ORAL_TABLET | ORAL | Status: DC

## 2011-01-20 NOTE — Progress Notes (Signed)
Went to UC on 01/10/11.  Dx's with sinus infection.  Started on amoxil 500mg  tid.  By the 9th, she wasn't better and had wheeze, chest congestion.  She declined oral prednisone at that point. Started on cough meds at that point, with some relief.  Not on SABA.    She felt some better on Wednesday and the less well yesterday.  Still with hoarseness, feeling of fluid in the ears.  Still with cough at night due to post nasal gtt.  No wheeze but chest feels tight occ. Temp max ~100.  Fatigued.  Some sputum.   Meds, vitals, and allergies reviewed.   ROS: See HPI.  Otherwise, noncontributory.  GEN: nad, alert and oriented HEENT: mucous membranes moist, tm w/o erythema, nasal exam w/o erythema, clear discharge noted,  OP with cobblestoning, frontal and max sinus mildly ttp x2 NECK: supple w/o LA CV: rrr.   PULM: ctab, no inc wob EXT: no edema SKIN: no acute rash

## 2011-01-20 NOTE — Patient Instructions (Signed)
Drink plenty of fluids, take tylenol as needed, and gargle with warm salt water for your throat.  This should gradually improve.  Take care.  Let us know if you have other concerns.  Start the antibiotics today.

## 2011-01-22 DIAGNOSIS — J329 Chronic sinusitis, unspecified: Secondary | ICD-10-CM | POA: Insufficient documentation

## 2011-01-22 NOTE — Assessment & Plan Note (Signed)
Given the duration and exam, start zmax and supportive tx o/w.  D/w pt and and she understood.  Okay for outpatient f/u.

## 2011-01-25 ENCOUNTER — Other Ambulatory Visit: Payer: Self-pay | Admitting: Family Medicine

## 2011-01-25 DIAGNOSIS — E119 Type 2 diabetes mellitus without complications: Secondary | ICD-10-CM

## 2011-01-25 DIAGNOSIS — E559 Vitamin D deficiency, unspecified: Secondary | ICD-10-CM

## 2011-01-25 DIAGNOSIS — E0590 Thyrotoxicosis, unspecified without thyrotoxic crisis or storm: Secondary | ICD-10-CM

## 2011-01-25 DIAGNOSIS — R609 Edema, unspecified: Secondary | ICD-10-CM

## 2011-01-25 DIAGNOSIS — R55 Syncope and collapse: Secondary | ICD-10-CM

## 2011-01-30 ENCOUNTER — Other Ambulatory Visit (INDEPENDENT_AMBULATORY_CARE_PROVIDER_SITE_OTHER): Payer: BC Managed Care – PPO | Admitting: Family Medicine

## 2011-01-30 DIAGNOSIS — R609 Edema, unspecified: Secondary | ICD-10-CM

## 2011-01-30 DIAGNOSIS — E0590 Thyrotoxicosis, unspecified without thyrotoxic crisis or storm: Secondary | ICD-10-CM

## 2011-01-30 DIAGNOSIS — E559 Vitamin D deficiency, unspecified: Secondary | ICD-10-CM

## 2011-01-30 DIAGNOSIS — E119 Type 2 diabetes mellitus without complications: Secondary | ICD-10-CM

## 2011-01-30 DIAGNOSIS — R55 Syncope and collapse: Secondary | ICD-10-CM

## 2011-01-30 LAB — MICROALBUMIN / CREATININE URINE RATIO: Microalb, Ur: 0.6 mg/dL (ref 0.0–1.9)

## 2011-01-30 LAB — RENAL FUNCTION PANEL
Calcium: 8.9 mg/dL (ref 8.4–10.5)
Creatinine, Ser: 0.6 mg/dL (ref 0.4–1.2)
Glucose, Bld: 154 mg/dL — ABNORMAL HIGH (ref 70–99)
Sodium: 134 mEq/L — ABNORMAL LOW (ref 135–145)

## 2011-01-30 LAB — CBC WITH DIFFERENTIAL/PLATELET
Basophils Relative: 0.5 % (ref 0.0–3.0)
Eosinophils Relative: 1.4 % (ref 0.0–5.0)
HCT: 41.1 % (ref 36.0–46.0)
Lymphs Abs: 1.5 10*3/uL (ref 0.7–4.0)
MCV: 89.9 fl (ref 78.0–100.0)
Monocytes Absolute: 0.7 10*3/uL (ref 0.1–1.0)
Platelets: 243 10*3/uL (ref 150.0–400.0)
WBC: 7.3 10*3/uL (ref 4.5–10.5)

## 2011-01-30 LAB — HEPATIC FUNCTION PANEL
Albumin: 4.6 g/dL (ref 3.5–5.2)
Total Protein: 7.9 g/dL (ref 6.0–8.3)

## 2011-01-30 LAB — TSH: TSH: 1.06 u[IU]/mL (ref 0.35–5.50)

## 2011-01-30 LAB — T4, FREE: Free T4: 0.83 ng/dL (ref 0.60–1.60)

## 2011-01-30 LAB — LIPID PANEL
Cholesterol: 191 mg/dL (ref 0–200)
HDL: 53.9 mg/dL (ref >39.00)
Triglycerides: 213 mg/dL — ABNORMAL HIGH (ref 0.0–149.0)
VLDL: 42.6 mg/dL — ABNORMAL HIGH (ref 0.0–40.0)

## 2011-01-30 LAB — LDL CHOLESTEROL, DIRECT: Direct LDL: 109.2 mg/dL

## 2011-01-31 LAB — VITAMIN D 25 HYDROXY (VIT D DEFICIENCY, FRACTURES): Vit D, 25-Hydroxy: 45 ng/mL (ref 30–89)

## 2011-02-01 ENCOUNTER — Ambulatory Visit (INDEPENDENT_AMBULATORY_CARE_PROVIDER_SITE_OTHER): Payer: BC Managed Care – PPO | Admitting: Family Medicine

## 2011-02-01 ENCOUNTER — Encounter: Payer: Self-pay | Admitting: Family Medicine

## 2011-02-01 DIAGNOSIS — E0590 Thyrotoxicosis, unspecified without thyrotoxic crisis or storm: Secondary | ICD-10-CM

## 2011-02-01 DIAGNOSIS — T7840XA Allergy, unspecified, initial encounter: Secondary | ICD-10-CM

## 2011-02-01 DIAGNOSIS — F32A Depression, unspecified: Secondary | ICD-10-CM

## 2011-02-01 DIAGNOSIS — J329 Chronic sinusitis, unspecified: Secondary | ICD-10-CM

## 2011-02-01 DIAGNOSIS — E119 Type 2 diabetes mellitus without complications: Secondary | ICD-10-CM

## 2011-02-01 DIAGNOSIS — F419 Anxiety disorder, unspecified: Secondary | ICD-10-CM | POA: Insufficient documentation

## 2011-02-01 DIAGNOSIS — N852 Hypertrophy of uterus: Secondary | ICD-10-CM

## 2011-02-01 DIAGNOSIS — F341 Dysthymic disorder: Secondary | ICD-10-CM

## 2011-02-01 MED ORDER — PRAVASTATIN SODIUM 80 MG PO TABS: 80.0000 mg | ORAL_TABLET | Freq: Every evening | ORAL | Status: DC

## 2011-02-01 NOTE — Assessment & Plan Note (Signed)
Control worse but has had bad few months with Family sickness and death. Will follow. Cont as is.

## 2011-02-01 NOTE — Assessment & Plan Note (Signed)
Improving. Cont curr meds.  Suggest Guaif.
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
## 2011-02-01 NOTE — Patient Instructions (Addendum)
SGOT, SGPT 272.4    6 WEEKS See me in 3 mos, SGOT, SGPT, CHOL PROFILE  272.4, A1C 250.00    prior  Take Guaifenesin (400mg ), take 11/2 tabs by mouth AM and NOON. Get GUAIFENESIN by  going to [facility] or [facility] and getting MUCOUS RELIEF EXPECTORANT/CONGESTION. DO NOT GET MUCINEX (Timed Release Guaifenesin)  Drink lots of fluids.

## 2011-02-01 NOTE — Assessment & Plan Note (Signed)
U/S nml. Not a problem.

## 2011-02-01 NOTE — Assessment & Plan Note (Signed)
Nos stable, as are sxs. Cont to follow.

## 2011-02-01 NOTE — Assessment & Plan Note (Signed)
Not on Sertraline. Is taking Buspar twice a day. Told she could take both Sertaline and increase dose of Buspar. She will continue current dose of Buspar 15mg  twice a day.

## 2011-02-01 NOTE — Assessment & Plan Note (Signed)
Continues antihistamine. ALlegry immunotherapy appears to help. Cont.

## 2011-03-10 ENCOUNTER — Other Ambulatory Visit: Payer: Self-pay | Admitting: Family Medicine

## 2011-03-10 DIAGNOSIS — E785 Hyperlipidemia, unspecified: Secondary | ICD-10-CM

## 2011-03-16 ENCOUNTER — Other Ambulatory Visit (INDEPENDENT_AMBULATORY_CARE_PROVIDER_SITE_OTHER): Payer: BC Managed Care – PPO

## 2011-03-16 DIAGNOSIS — E785 Hyperlipidemia, unspecified: Secondary | ICD-10-CM

## 2011-03-16 LAB — ALT: ALT: 33 U/L (ref 0–35)

## 2011-03-16 LAB — AST: AST: 30 U/L (ref 0–37)

## 2011-04-24 ENCOUNTER — Other Ambulatory Visit: Payer: Self-pay | Admitting: Family Medicine

## 2011-04-24 DIAGNOSIS — E119 Type 2 diabetes mellitus without complications: Secondary | ICD-10-CM

## 2011-04-24 DIAGNOSIS — E785 Hyperlipidemia, unspecified: Secondary | ICD-10-CM

## 2011-04-27 ENCOUNTER — Other Ambulatory Visit (INDEPENDENT_AMBULATORY_CARE_PROVIDER_SITE_OTHER): Payer: BC Managed Care – PPO

## 2011-04-27 DIAGNOSIS — E785 Hyperlipidemia, unspecified: Secondary | ICD-10-CM

## 2011-04-27 DIAGNOSIS — E119 Type 2 diabetes mellitus without complications: Secondary | ICD-10-CM

## 2011-04-27 LAB — LIPID PANEL
Cholesterol: 156 mg/dL (ref 0–200)
HDL: 52.2 mg/dL (ref >39.00)
Triglycerides: 131 mg/dL (ref 0.0–149.0)
VLDL: 26.2 mg/dL (ref 0.0–40.0)

## 2011-04-27 LAB — AST: AST: 23 U/L (ref 0–37)

## 2011-05-02 ENCOUNTER — Ambulatory Visit: Payer: Self-pay

## 2011-05-04 ENCOUNTER — Encounter: Payer: Self-pay | Admitting: Family Medicine

## 2011-05-04 ENCOUNTER — Ambulatory Visit (INDEPENDENT_AMBULATORY_CARE_PROVIDER_SITE_OTHER): Payer: BC Managed Care – PPO | Admitting: Family Medicine

## 2011-05-04 DIAGNOSIS — E119 Type 2 diabetes mellitus without complications: Secondary | ICD-10-CM

## 2011-05-04 DIAGNOSIS — E7800 Pure hypercholesterolemia, unspecified: Secondary | ICD-10-CM

## 2011-05-04 DIAGNOSIS — E1169 Type 2 diabetes mellitus with other specified complication: Secondary | ICD-10-CM | POA: Insufficient documentation

## 2011-05-04 NOTE — Patient Instructions (Signed)
RTC next year with Dr Para March.

## 2011-05-04 NOTE — Assessment & Plan Note (Signed)
Improved A1C slightly. Still can be improved. If continues with wt loss, will get under 7. Encouraged.

## 2011-05-04 NOTE — Assessment & Plan Note (Signed)
Improved. LDL from 100+ to 78. Cont incr dose of Prava. Lab Results  Component Value Date   CHOL 156 04/27/2011   CHOL 191 01/30/2011   CHOL 157 12/13/2009   Lab Results  Component Value Date   HDL 52.20 04/27/2011   HDL 53.90 01/30/2011   HDL 82.95 12/13/2009   Lab Results  Component Value Date   LDLCALC 78 04/27/2011   LDLCALC 83 12/13/2009   LDLCALC 93 12/08/2008   Lab Results  Component Value Date   TRIG 131.0 04/27/2011   TRIG 213.0* 01/30/2011   TRIG 142.0 12/13/2009   Lab Results  Component Value Date   CHOLHDL 3 04/27/2011   CHOLHDL 4 01/30/2011   CHOLHDL 3 12/13/2009   Lab Results  Component Value Date   LDLDIRECT 109.2 01/30/2011

## 2011-05-04 NOTE — Progress Notes (Signed)
  Subjective:    Patient ID: Kelly Reed, female    DOB: 1966-03-15, 45 y.o.   MRN: [id number]  HPI Pt here for PAP/pelvic and breast exam. I also increased her Prava last time from 40 to 80 to better get LDL lower in this diabetic. She has tolerated that well. I also rechecked her A1C as am trying to improve her diabetes control. Her mammo was Tues and she had breast exam last visit. She has had regular Pap smears and prefers not to have it today.  She is tolerating her increased Prava ok.  She has had frequent problems with calf cramping at night recently.    Review of SystemsNoncontributory except as above.       Objective:   Physical Exam  Constitutional: She is oriented to person, place, and time. She appears well-developed and well-nourished. No distress.  HENT:  Head: Normocephalic and atraumatic.  Right Ear: External ear normal.  Left Ear: External ear normal.  Nose: Nose normal.  Mouth/Throat: Oropharynx is clear and moist. No oropharyngeal exudate.  Eyes: Conjunctivae and EOM are normal. Pupils are equal, round, and reactive to light.  Neck: Normal range of motion. Neck supple. No thyromegaly present.  Cardiovascular: Normal rate, regular rhythm and normal heart sounds.   Pulmonary/Chest: Effort normal and breath sounds normal. She has no wheezes. She has no rales.  Lymphadenopathy:    She has no cervical adenopathy.  Neurological: She is alert and oriented to person, place, and time. She has normal reflexes. She displays normal reflexes. No cranial nerve deficit. She exhibits normal muscle tone. Coordination normal.       LEs nml to palp and inspection.  Skin: Skin is warm and dry. No rash noted. She is not diaphoretic. No erythema.          Assessment & Plan:

## 2011-05-08 ENCOUNTER — Other Ambulatory Visit: Payer: Self-pay | Admitting: *Deleted

## 2011-05-08 MED ORDER — BUSPIRONE HCL 15 MG PO TABS: 7.5000 mg | ORAL_TABLET | Freq: Two times a day (BID) | ORAL | Status: DC

## 2011-05-08 NOTE — Telephone Encounter (Signed)
Refill sent.

## 2011-05-08 NOTE — Telephone Encounter (Signed)
Patient came by the office requesting a refill on her medication. Patient states that she is completely out. Is it okay to refill?

## 2011-06-08 ENCOUNTER — Telehealth: Payer: Self-pay

## 2011-06-08 ENCOUNTER — Encounter: Payer: Self-pay | Admitting: Family Medicine

## 2011-06-08 ENCOUNTER — Ambulatory Visit (INDEPENDENT_AMBULATORY_CARE_PROVIDER_SITE_OTHER): Payer: BC Managed Care – PPO | Admitting: Family Medicine

## 2011-06-08 DIAGNOSIS — E119 Type 2 diabetes mellitus without complications: Secondary | ICD-10-CM

## 2011-06-08 DIAGNOSIS — R42 Dizziness and giddiness: Secondary | ICD-10-CM

## 2011-06-08 MED ORDER — MECLIZINE HCL 25 MG PO TABS: 12.5000 mg | ORAL_TABLET | Freq: Three times a day (TID) | ORAL | Status: AC | PRN

## 2011-06-08 MED ORDER — METFORMIN HCL 500 MG PO TABS: 500.0000 mg | ORAL_TABLET | Freq: Two times a day (BID) | ORAL | Status: DC

## 2011-06-08 NOTE — Progress Notes (Signed)
She had been feeling lightheaded since this AM.  She felt like she was spinning (but not the room spinning) and she felt presyncopal.  She had mult episodes that would self resolve today.  No full syncope.  Sugar up to ~250 today.  Taking 500mg  metformin at night.    H/o sinusitis sx recently.  Had been on flonase, but this isn't new.    Last A1c 7.7.  She was back to walking and thought she had been doing better from a sugar standpoint  She feels better since ~2:30 today.  No polyuria, polyphagia.    Meds, vitals, and allergies reviewed.   ROS: See HPI.  Otherwise, noncontributory.  GEN: nad, alert and oriented HEENT: mucous membranes moist, tm w/o erythema NECK: supple w/o LA CV: rrr PULM: ctab, no inc wob ABD: soft, +bs EXT: no edema SKIN: no acute rash CN 2-12 wnl B, S/S/DTR wnl x4 DHP wnl

## 2011-06-08 NOTE — Patient Instructions (Signed)
Take the metformin twice a day and let me know about your sugars in about 2 weeks.  Take the meclizine for vertigo and I'll do you FMLA papers.  Take care.

## 2011-06-08 NOTE — Telephone Encounter (Signed)
Replacement nurse called pt started with dizziness this AM. No h/a,no N&V,no fever and no diarrhea.Pts blood sugar 5 hrs after eating was 258. Pt does not ck sugar at home. Pts husband will pick pt up and bring to see Dr Para March this afternoon. If condition worsens will call back sooner.

## 2011-06-09 ENCOUNTER — Encounter: Payer: Self-pay | Admitting: Family Medicine

## 2011-06-09 DIAGNOSIS — R42 Dizziness and giddiness: Secondary | ICD-10-CM | POA: Insufficient documentation

## 2011-06-09 NOTE — Assessment & Plan Note (Signed)
Inc metformin, she'll call back with update on sugars in about 2 weeks.  a1c d/w pt, along with diet and weight.

## 2011-06-09 NOTE — Assessment & Plan Note (Signed)
I don't think this is related to the sugar level.  She's feeling better now with normal exam.  She may have had BPV.  I wouldn't image or w/u further now, but use meclizine prn.  FMLA papers to be done in case she has return of sx.  She agrees.  >25 min spent with face to face with patient, >50% counseling and/or coordinating care.

## 2011-08-01 ENCOUNTER — Other Ambulatory Visit: Payer: Self-pay | Admitting: *Deleted

## 2011-08-01 MED ORDER — LISINOPRIL 2.5 MG PO TABS: 2.5000 mg | ORAL_TABLET | Freq: Every day | ORAL | Status: DC

## 2011-08-02 ENCOUNTER — Other Ambulatory Visit: Payer: Self-pay | Admitting: *Deleted

## 2011-08-02 NOTE — Telephone Encounter (Signed)
Ok to refill? Last OV 11/12.

## 2011-08-03 MED ORDER — BUSPIRONE HCL 15 MG PO TABS: 7.5000 mg | ORAL_TABLET | Freq: Two times a day (BID) | ORAL | Status: DC

## 2012-02-05 ENCOUNTER — Other Ambulatory Visit: Payer: Self-pay | Admitting: *Deleted

## 2012-02-05 MED ORDER — BUSPIRONE HCL 15 MG PO TABS: 7.5000 mg | ORAL_TABLET | Freq: Two times a day (BID) | ORAL | Status: DC

## 2012-02-05 NOTE — Telephone Encounter (Signed)
Received faxed refill request from pharmacy. Last refill 01/08/12. Last office visit 06/08/11

## 2012-02-05 NOTE — Telephone Encounter (Signed)
Patient advised.  Pt says she is not comfortable with you doing her Pap smear.  Does she need to get an OB/GYN for this?

## 2012-02-05 NOTE — Telephone Encounter (Signed)
Due for CPE.  Sent.  

## 2012-02-06 NOTE — Telephone Encounter (Signed)
And she still needs a CPE.

## 2012-02-06 NOTE — Telephone Encounter (Signed)
LMOVM

## 2012-02-06 NOTE — Telephone Encounter (Signed)
Yes, she'll need to find another MD to do her gyn care.

## 2012-03-28 ENCOUNTER — Encounter: Payer: Self-pay | Admitting: Family Medicine

## 2012-03-28 ENCOUNTER — Ambulatory Visit (INDEPENDENT_AMBULATORY_CARE_PROVIDER_SITE_OTHER): Payer: BC Managed Care – PPO | Admitting: Family Medicine

## 2012-03-28 VITALS — BP 130/84 | HR 80 | Temp 98.1°F | Ht 72.0 in | Wt 233.0 lb

## 2012-03-28 DIAGNOSIS — F341 Dysthymic disorder: Secondary | ICD-10-CM

## 2012-03-28 DIAGNOSIS — Z0000 Encounter for general adult medical examination without abnormal findings: Secondary | ICD-10-CM

## 2012-03-28 DIAGNOSIS — E119 Type 2 diabetes mellitus without complications: Secondary | ICD-10-CM

## 2012-03-28 DIAGNOSIS — F329 Major depressive disorder, single episode, unspecified: Secondary | ICD-10-CM

## 2012-03-28 MED ORDER — LISINOPRIL 2.5 MG PO TABS: 2.5000 mg | ORAL_TABLET | Freq: Every day | ORAL | Status: DC

## 2012-03-28 MED ORDER — BUSPIRONE HCL 15 MG PO TABS: 7.5000 mg | ORAL_TABLET | Freq: Two times a day (BID) | ORAL | Status: DC

## 2012-03-28 MED ORDER — METFORMIN HCL 500 MG PO TABS: ORAL_TABLET | ORAL | Status: DC

## 2012-03-28 NOTE — Progress Notes (Signed)
CPE- See plan.  Routine anticipatory guidance given to patient.  See health maintenance. Tetanus 2005 Flu shot at work PAP smear due 2014 Mammogram to be done through work Labs reviewed.   Advance directive d/w pt.  She'll talk with family.  She would designate her husband if incapacitated.   Colon cancer screening due at 48.   Anxiety, treated with buspar and no ADE.  No SI/HI.  Doing well with meds  H/o sinus congestion and sinusitis with prev ENT eval, f/u with ENT has been delayed.    Diabetes:  Using medications without difficulties: yes Hypoglycemic episodes: no Hyperglycemic episodes:no Feet problems: no Blood Sugars averaging: "it's been up a little", ~150 A1c 8.0 She just got a new meter.   Diet discussed.  "I lose 5 lbs and then I gain it back."   She admits to being a stress eater.  Husband lost his job and is looking for work.  PMH and SH reviewed  Meds, vitals, and allergies reviewed.   ROS: See HPI.  Otherwise negative.    GEN: nad, alert and oriented HEENT: mucous membranes moist NECK: supple w/o LA CV: rrr. PULM: ctab, no inc wob ABD: soft, +bs EXT: no edema SKIN: no acute rash Breast exam: No mass, nodules, thickening, tenderness, bulging, retraction, inflamation, nipple discharge or skin changes noted.  No axillary or clavicular LA.  Chaperoned exam.   Diabetic foot exam: Normal inspection No skin breakdown No calluses  Normal DP pulses Normal sensation to light touch and monofilament Nails normal

## 2012-03-28 NOTE — Patient Instructions (Addendum)
I would try to get up to 4 pills a day with the metformin.  Recheck A1c in 3 months.  Take care.  I would get a flu shot each fall.

## 2012-03-29 DIAGNOSIS — Z0001 Encounter for general adult medical examination with abnormal findings: Secondary | ICD-10-CM | POA: Insufficient documentation

## 2012-03-29 DIAGNOSIS — Z0000 Encounter for general adult medical examination without abnormal findings: Secondary | ICD-10-CM | POA: Insufficient documentation

## 2012-03-29 NOTE — Assessment & Plan Note (Addendum)
Needs to inc metformin, exercise, and work on weight.  She understood.  Labs d/w pt. A1c 8.  Recheck A1c in 3 months, order written.

## 2012-03-29 NOTE — Assessment & Plan Note (Signed)
Routine anticipatory guidance given to patient.  See health maintenance. Tetanus 2005 Flu shot at work PAP smear due 2014 Mammogram to be done through work Labs reviewed.   Advance directive d/w pt.  She'll talk with family.  She would designate her husband if incapacitated.   Colon cancer screening due at 48.

## 2012-03-29 NOTE — Assessment & Plan Note (Signed)
Controlled, in spite of social stress.  Continue current meds.  No SI/HI.

## 2012-03-29 NOTE — Assessment & Plan Note (Signed)
Mild, likely from fatty liver, needs weight loss.  D/ wpt.

## 2012-04-01 ENCOUNTER — Encounter: Payer: Self-pay | Admitting: Family Medicine

## 2012-04-01 LAB — CHG GENERAL HEALTH PANEL
AST: 43 U/L
Cholesterol, Total: 173
Glucose, Fasting: 178 mg/dL — AB (ref 60–109)
LDL Cholesterol: 92 mg/dL
Triglycerides: 140

## 2012-04-30 ENCOUNTER — Ambulatory Visit: Payer: Self-pay

## 2012-07-20 ENCOUNTER — Ambulatory Visit (INDEPENDENT_AMBULATORY_CARE_PROVIDER_SITE_OTHER): Payer: PRIVATE HEALTH INSURANCE | Admitting: Family Medicine

## 2012-07-20 ENCOUNTER — Encounter: Payer: Self-pay | Admitting: Family Medicine

## 2012-07-20 VITALS — BP 128/82 | HR 81 | Temp 98.2°F | Resp 16 | Wt 231.8 lb

## 2012-07-20 DIAGNOSIS — J111 Influenza due to unidentified influenza virus with other respiratory manifestations: Secondary | ICD-10-CM | POA: Insufficient documentation

## 2012-07-20 MED ORDER — HYDROCODONE-HOMATROPINE 5-1.5 MG/5ML PO SYRP: 5.0000 mL | ORAL_SOLUTION | Freq: Three times a day (TID) | ORAL | Status: DC | PRN

## 2012-07-20 NOTE — Patient Instructions (Signed)
Rest at home today and tomorrow  Drink lots of liquids  Tylenol or aspirin when necessary  Hydromet 1/2-1 teaspoon up to 3 times daily. For cough and cold okay to go back to work on Monday  I would recommend you followup with Dr. Para March on your diabetes

## 2012-07-20 NOTE — Progress Notes (Signed)
  Subjective:    Patient ID: Kelly Reed, female    DOB: 06/28/1966, 47 y.o.   MRN: [id number]  HPI Kelly Reed is a 47 year old married female nonsmoker who works at [employer] who comes in today with a week's history of the flu  She states on Monday she went to see the nurse practitioner at work because of fever chills and aching all over sore throat head congestion and cough. At that time her temperature was 101. She was told she had the flu and was given Tamiflu. She took the Tamiflu but it really didn't help. Information was given on the Tamiflu including the fact that it really doesn't help that much and most people have stopped using it.  Today she is afebrile but she has had congestion sore throat and nonproductive cough.  Her husband and 2 sons at home are well. She had the flu shot in October.  She is also diabetic and is on metformin 500 mg daily. She states her blood sugar yesterday was in the 125 range. She states the nurse practitioner has been managing her diabetes. She states the nurse practitioner draws or blood and then call us Dr. Para March who then advised her what to do. I advised her to see Dr. Para March in the future to have her blood work drawn at his office and he can manage his her diabetes    Review of Systems General and metabolic and pulmonary review of systems otherwise negative    Objective:   Physical Exam  Well-developed well nourished female no acute distress HEENT negative neck was supple no adenopathy lungs are clear      Assessment & Plan:  Influenza resolving plan continue liquids rest Hydromet when necessary for cough and cold return when necessary

## 2012-09-02 ENCOUNTER — Ambulatory Visit (INDEPENDENT_AMBULATORY_CARE_PROVIDER_SITE_OTHER): Payer: PRIVATE HEALTH INSURANCE | Admitting: Emergency Medicine

## 2012-09-02 VITALS — BP 139/90 | HR 114 | Temp 97.8°F | Resp 16 | Ht 71.5 in | Wt 222.2 lb

## 2012-09-02 DIAGNOSIS — J209 Acute bronchitis, unspecified: Secondary | ICD-10-CM

## 2012-09-02 MED ORDER — AMOXICILLIN-POT CLAVULANATE 875-125 MG PO TABS: 1.0000 | ORAL_TABLET | Freq: Two times a day (BID) | ORAL | Status: DC

## 2012-09-02 MED ORDER — PSEUDOEPHEDRINE-GUAIFENESIN ER 60-600 MG PO TB12: 1.0000 | ORAL_TABLET | Freq: Two times a day (BID) | ORAL | Status: DC

## 2012-09-02 MED ORDER — HYDROCOD POLST-CHLORPHEN POLST 10-8 MG/5ML PO LQCR: 5.0000 mL | Freq: Two times a day (BID) | ORAL | Status: DC | PRN

## 2012-09-02 NOTE — Patient Instructions (Signed)

## 2012-09-02 NOTE — Progress Notes (Signed)
[facility] [address] [place] [address] [phone]  Date:  09/02/2012   Name:  Kelly Reed   DOB:  Jan 30, 1966   MRN:  [id number]  PCP:  Crawford Givens, MD    Chief Complaint: No chief complaint on file.   History of Present Illness:  Kelly Reed is a 47 y.o. very pleasant female patient who presents with the following:  Ill for a week with cough productive purulent sputum and nasal congestion and drainage that is also purulent.  Treated last week for "bronchitis" with zpak and has not improved. Now sent here by PA at work who was concerned that she may have pneumonia because she is wheezing.  No shortness of breath, fever or chills, nausea or vomiting. No improvement with OTC medications.   Patient Active Problem List  Diagnosis  . HYPERTHYROIDISM  . DIABETES MELLITUS, TYPE II  . UNSPECIFIED VITAMIN D DEFICIENCY  . SYNCOPE  . EDEMA  . ALLERGY  . Anxiety and depression  . Hypercholesteremia  . Vertigo  . Routine general medical examination at a health care facility  . Transaminitis  . Influenza    Past Medical History  Diagnosis Date  . Diabetes mellitus type II   . NSVD (normal spontaneous vaginal delivery)     x 2  . Hyperlipidemia   . Anxiety     Past Surgical History  Procedure Laterality Date  . Tubal ligation  1993    bilateral  . History of abd. ultrasound  12/04    negative  . 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 Surg. eval lap cholecstectomy  05/28/2006    deferred by patient    History  Substance Use Topics  . Smoking status: Never Smoker   . Smokeless tobacco: Never Used  . Alcohol Use: .5 - 1 oz/week    1-2 drink(s) per week     Comment: Occasional beer    Family History  Problem Relation Age of Onset  . Cancer Mother     breast    Allergies  Allergen Reactions  . Prednisone     Hyperglycemia with oral steroids    Medication list has been reviewed and updated.  Current Outpatient  Prescriptions on File Prior to Visit  Medication Sig Dispense Refill  . busPIRone (BUSPAR) 15 MG tablet Take 0.5 tablets (7.5 mg total) by mouth 2 (two) times daily.  30 tablet  12  . calcium-vitamin D (OSCAL WITH D) 500-200 MG-UNIT per tablet Take 1 tablet by mouth daily.      Jennette Banker Sodium 30-100 MG CAPS Take by mouth at bedtime.        . Cetirizine-Pseudoephedrine (ZYRTEC-D PO) Take by mouth daily.        . ferrous sulfate 325 (65 FE) MG tablet Take 325 mg by mouth at bedtime.        . fluticasone (FLONASE) 50 MCG/ACT nasal spray Place 1 spray into the nose daily.        Marland Kitchen HYDROcodone-homatropine (HYCODAN) 5-1.5 MG/5ML syrup Take 5 mLs by mouth every 8 (eight) hours as needed for cough.  240 mL  1  . lisinopril (PRINIVIL,ZESTRIL) 2.5 MG tablet Take 1 tablet (2.5 mg total) by mouth at bedtime.  30 tablet  12  . metFORMIN (GLUCOPHAGE) 500 MG tablet Take up to 4 tabs a day  360 tablet  3  . NON FORMULARY Take allergy shots once a week as directed       .  cyclobenzaprine (FLEXERIL) 5 MG tablet Take 1/2 to 1 by mouth three times a day as needed       . pravastatin (PRAVACHOL) 80 MG tablet Take 1 tablet (80 mg total) by mouth every evening.  30 tablet  11   No current facility-administered medications on file prior to visit.    Review of Systems:  As per HPI, otherwise negative.    Physical Examination: Filed Vitals:   09/02/12 1049  BP: 139/90  Pulse: 114  Temp: 97.8 F (36.6 C)  Resp: 16   Filed Vitals:   09/02/12 1049  Height: 5' 11.5" (1.816 m)  Weight: 222 lb 3.2 oz (100.789 kg)   Body mass index is 30.56 kg/(m^2). Ideal Body Weight: Weight in (lb) to have BMI = 25: 181.4  GEN: WDWN, NAD, Non-toxic, A & O x 3 HEENT: Atraumatic, Normocephalic. Neck supple. No masses, No LAD. Ears and Nose: No external deformity. CV: RRR, No M/G/R. No JVD. No thrill. No extra heart sounds. PULM: CTA B, no wheezes, crackles, rhonchi. No retractions. No resp. distress. No  accessory muscle use. ABD: S, NT, ND, +BS. No rebound. No HSM. EXTR: No c/c/e NEURO Normal gait.  PSYCH: Normally interactive. Conversant. Not depressed or anxious appearing.  Calm demeanor.    Assessment and Plan: Sinusitis Bronchitis augmentin mucinex d tussionex   Carmelina Dane, MD

## 2012-09-13 ENCOUNTER — Ambulatory Visit: Payer: PRIVATE HEALTH INSURANCE | Admitting: Family Medicine

## 2012-09-27 ENCOUNTER — Ambulatory Visit (INDEPENDENT_AMBULATORY_CARE_PROVIDER_SITE_OTHER): Payer: PRIVATE HEALTH INSURANCE | Admitting: Family Medicine

## 2012-09-27 ENCOUNTER — Encounter: Payer: Self-pay | Admitting: Family Medicine

## 2012-09-27 VITALS — BP 138/82 | HR 104 | Temp 98.0°F | Resp 16 | Ht 70.0 in | Wt 222.2 lb

## 2012-09-27 DIAGNOSIS — R748 Abnormal levels of other serum enzymes: Secondary | ICD-10-CM

## 2012-09-27 DIAGNOSIS — Z79899 Other long term (current) drug therapy: Secondary | ICD-10-CM

## 2012-09-27 DIAGNOSIS — E119 Type 2 diabetes mellitus without complications: Secondary | ICD-10-CM

## 2012-09-27 DIAGNOSIS — R7401 Elevation of levels of liver transaminase levels: Secondary | ICD-10-CM

## 2012-09-27 DIAGNOSIS — R634 Abnormal weight loss: Secondary | ICD-10-CM

## 2012-09-27 DIAGNOSIS — L723 Sebaceous cyst: Secondary | ICD-10-CM

## 2012-09-27 MED ORDER — PRAVASTATIN SODIUM 80 MG PO TABS: 80.0000 mg | ORAL_TABLET | Freq: Every day | ORAL | Status: DC

## 2012-09-27 NOTE — Progress Notes (Signed)
Subjective:    Patient ID: Kelly Reed, female    DOB: 11-Jan-1966, 47 y.o.   MRN: [id number] Chief Complaint  Patient presents with  . Diabetes  . Establish Care  . Foot Pain    left with lump  x 03/2012    HPI  Allergic to dust mites and giving herself allergy shots at home.  She has had a reaction to her allergy shot and she was still getting sinus infections. Has basically been feeling sick since Aug  Gets metformin, pravastatin, and flonase free at work so does not need refills on those unless we are going to change her rx.  She is followed by a nurse practitioner at work who monitors her chronic disease, labs, and meds, then just f/u w/ her PCP when something isn't going as planned. May need refills - she is not sure  Loosing weight w/o trying - she is not sure why.  Fatty foods just don't sound appealing to her anymore. No early satiety. She lost 20lbs in the past few mos w/o trying to change anything.  It's great - but she is worried that something cold be wrong underlying and causing this - esp since she feels sick constantly.  Over the past 6 mo, she has had a lump increasing in size on her foot. Not painful in and of itself but now getting to the size where shoes are making it hurt due to the pressure and rubbing.  Past Medical History  Diagnosis Date  . Diabetes mellitus type II   . NSVD (normal spontaneous vaginal delivery)     x 2  . Hyperlipidemia   . Anxiety    Current Outpatient Prescriptions on File Prior to Visit  Medication Sig Dispense Refill  . busPIRone (BUSPAR) 15 MG tablet Take 0.5 tablets (7.5 mg total) by mouth 2 (two) times daily.  30 tablet  12  . calcium-vitamin D (OSCAL WITH D) 500-200 MG-UNIT per tablet Take 1 tablet by mouth daily.      . Cetirizine-Pseudoephedrine (ZYRTEC-D PO) Take by mouth daily.        . ferrous sulfate 325 (65 FE) MG tablet Take 325 mg by mouth at bedtime.        . fluticasone (FLONASE) 50 MCG/ACT nasal spray Place 1 spray into  the nose daily.        Marland Kitchen lisinopril (PRINIVIL,ZESTRIL) 2.5 MG tablet Take 1 tablet (2.5 mg total) by mouth at bedtime.  30 tablet  12  . metFORMIN (GLUCOPHAGE) 500 MG tablet Take up to 4 tabs a day  360 tablet  3  . Casanthranol-Docusate Sodium 30-100 MG CAPS Take by mouth at bedtime.        . cyclobenzaprine (FLEXERIL) 5 MG tablet Take 1/2 to 1 by mouth three times a day as needed       . NON FORMULARY Take allergy shots once a week as directed        No current facility-administered medications on file prior to visit.   Allergies  Allergen Reactions  . Prednisone     Hyperglycemia with oral steroids    Review of Systems  Constitutional: Positive for appetite change, fatigue and unexpected weight change. Negative for fever, chills, diaphoresis and activity change.  HENT: Positive for congestion, rhinorrhea, sneezing, postnasal drip and sinus pressure.   Respiratory: Negative for cough and shortness of breath.   Cardiovascular: Negative for chest pain and leg swelling.  Gastrointestinal: Negative for blood in stool and anal bleeding.  Endocrine: Negative for polydipsia, polyphagia and polyuria.  Musculoskeletal: Positive for myalgias and arthralgias. Negative for back pain, joint swelling and gait problem.  Skin: Negative for rash.  Allergic/Immunologic: Positive for environmental allergies. Negative for immunocompromised state.  Neurological: Negative for syncope.  Psychiatric/Behavioral: Negative for dysphoric mood. The patient is not nervous/anxious.   All other systems reviewed and are negative.      BP 138/82  Pulse 104  Temp(Src) 98 F (36.7 C) (Oral)  Resp 16  Ht 5\' 10"  (1.778 m)  Wt 222 lb 3.2 oz (100.789 kg)  BMI 31.88 kg/m2  SpO2 98%  LMP 09/23/2012 Objective:   Physical Exam  Constitutional: She is oriented to person, place, and time. She appears well-developed and well-nourished. No distress.  HENT:  Head: Normocephalic and atraumatic.  Right Ear: External  ear normal.  Left Ear: External ear normal.  Eyes: Conjunctivae are normal. No scleral icterus.  Neck: Normal range of motion. Neck supple. No thyromegaly present.  Cardiovascular: Normal rate, regular rhythm, normal heart sounds and intact distal pulses.   Pulmonary/Chest: Effort normal and breath sounds normal. No respiratory distress.  Musculoskeletal: She exhibits no edema.       Left foot: She exhibits deformity. She exhibits normal range of motion, no tenderness, no bony tenderness, no swelling, normal capillary refill and no crepitus.       Feet:  Firm mobile well-defined nodule over lateral aspect of dorsal surface of left mid-foot. No tenderness, warmth, erythema.  Lymphadenopathy:    She has no cervical adenopathy.  Neurological: She is alert and oriented to person, place, and time.  Skin: Skin is warm and dry. No rash noted. She is not diaphoretic. No erythema.  Psychiatric: She has a normal mood and affect. Her behavior is normal.     Pt brought in a copy of her labs done by her NP Reece Packer at [place] done 2/20 - 1 mo ago. All labs reviewed and scanned in. IN summary CMP nml but ALT 40, glucose 143, uric acid 4 Lipids at goal w/ LDL 71, trig 101, hdl 46, t chol 137 tsh 0.679 w/ nml full thyroid panel Cbc nml hgba1c 7.2 (was 8.0 6 mos prev)!! Vit D 30.4 Serum qn IgG 929 nml (range [phone]) Assessment & Plan:  Sebaceous cyst - Plan: Ambulatory referral to Podiatry - due to location on foot - no subcu fat and close proximity to tendons - will refer to specialist for removal  Type II or unspecified type diabetes mellitus without mention of complication, not stated as uncontrolled - Plan: CBC with Differential. Doing MUCH better.  She did have her metformin increased a little prior so maybe that is helping w/ her weight loss??? Cont to monitor but cons adding in byetta or victoza at f/u since a1c not at goal.  Elevated liver enzymes - Plan: Sedimentation Rate, Comprehensive  metabolic panel, CBC with Differential. Hopefully will improve w/ weight loss.  Loss of weight - Plan: Sedimentation Rate, TSH - always concerning w/ unintentional weightloss but pt does not have any other sxs at that time and just had a CPP 6 mos prev.  According to our scales, weight has been stable in past mo but prior did loose >10 lbs. Cont to monitor and do further eval if continues.  Meds ordered this encounter  Medications  .       . pravastatin (PRAVACHOL) 80 MG tablet    Sig: Take 1 tablet (80 mg total) by mouth daily.  Dispense:  90 tablet    Refill:  3

## 2012-09-28 LAB — CBC WITH DIFFERENTIAL/PLATELET
Basophils Relative: 0 % (ref 0–1)
Eosinophils Absolute: 0.1 10*3/uL (ref 0.0–0.7)
Eosinophils Relative: 1 % (ref 0–5)
Lymphs Abs: 1.9 10*3/uL (ref 0.7–4.0)
MCH: 28.9 pg (ref 26.0–34.0)
MCHC: 34.6 g/dL (ref 30.0–36.0)
MCV: 83.5 fL (ref 78.0–100.0)
Neutrophils Relative %: 65 % (ref 43–77)
Platelets: 267 10*3/uL (ref 150–400)
RBC: 4.25 MIL/uL (ref 3.87–5.11)
RDW: 13.8 % (ref 11.5–15.5)

## 2012-09-28 LAB — COMPREHENSIVE METABOLIC PANEL
ALT: 30 U/L (ref 0–35)
AST: 23 U/L (ref 0–37)
Albumin: 4.5 g/dL (ref 3.5–5.2)
Alkaline Phosphatase: 57 U/L (ref 39–117)
Calcium: 9.1 mg/dL (ref 8.4–10.5)
Chloride: 105 mEq/L (ref 96–112)
Potassium: 3.8 mEq/L (ref 3.5–5.3)
Sodium: 139 mEq/L (ref 135–145)
Total Protein: 6.9 g/dL (ref 6.0–8.3)

## 2012-10-04 ENCOUNTER — Telehealth: Payer: Self-pay

## 2012-10-04 NOTE — Telephone Encounter (Signed)
PT SAW SHAW LAST WEEK AND HAS NOT HEARD ANYTHING ABOUT HER BLOOD RESULTS.  PLEASE CALL [phone]

## 2012-10-04 NOTE — Telephone Encounter (Signed)
Notes Recorded by Sherren Mocha, MD on 10/04/2012 at 11:29 AM Your labs look great. Please keep an eye on your weight and if it is just coming off to easily, make sure you return for further eval so we can discuss the next steps. Called her to advise. Left message for her to call back if she wants specific directions.

## 2012-10-22 ENCOUNTER — Encounter: Payer: Self-pay | Admitting: Family Medicine

## 2012-10-22 DIAGNOSIS — M6740 Ganglion, unspecified site: Secondary | ICD-10-CM | POA: Insufficient documentation

## 2012-11-29 ENCOUNTER — Ambulatory Visit: Payer: Self-pay | Admitting: Family Medicine

## 2012-12-06 ENCOUNTER — Encounter: Payer: Self-pay | Admitting: Family Medicine

## 2012-12-06 ENCOUNTER — Ambulatory Visit (INDEPENDENT_AMBULATORY_CARE_PROVIDER_SITE_OTHER): Payer: PRIVATE HEALTH INSURANCE | Admitting: Family Medicine

## 2012-12-06 VITALS — BP 149/78 | HR 96 | Temp 99.1°F | Resp 16 | Ht 71.5 in | Wt 225.0 lb

## 2012-12-06 DIAGNOSIS — E0590 Thyrotoxicosis, unspecified without thyrotoxic crisis or storm: Secondary | ICD-10-CM

## 2012-12-06 DIAGNOSIS — I10 Essential (primary) hypertension: Secondary | ICD-10-CM

## 2012-12-06 DIAGNOSIS — E785 Hyperlipidemia, unspecified: Secondary | ICD-10-CM

## 2012-12-06 DIAGNOSIS — E119 Type 2 diabetes mellitus without complications: Secondary | ICD-10-CM

## 2012-12-06 DIAGNOSIS — E559 Vitamin D deficiency, unspecified: Secondary | ICD-10-CM

## 2012-12-06 DIAGNOSIS — Z23 Encounter for immunization: Secondary | ICD-10-CM

## 2012-12-06 DIAGNOSIS — M6740 Ganglion, unspecified site: Secondary | ICD-10-CM

## 2012-12-06 DIAGNOSIS — Z79899 Other long term (current) drug therapy: Secondary | ICD-10-CM

## 2012-12-06 DIAGNOSIS — E7800 Pure hypercholesterolemia, unspecified: Secondary | ICD-10-CM

## 2012-12-06 MED ORDER — FLUTICASONE PROPIONATE 50 MCG/ACT NA SUSP: 2.0000 | Freq: Every day | NASAL | Status: DC

## 2012-12-06 MED ORDER — PRAVASTATIN SODIUM 80 MG PO TABS: 80.0000 mg | ORAL_TABLET | Freq: Every day | ORAL | Status: DC

## 2012-12-06 MED ORDER — METFORMIN HCL 1000 MG PO TABS: 1000.0000 mg | ORAL_TABLET | Freq: Two times a day (BID) | ORAL | Status: DC

## 2012-12-06 MED ORDER — LISINOPRIL 10 MG PO TABS: 10.0000 mg | ORAL_TABLET | Freq: Every day | ORAL | Status: DC

## 2012-12-06 NOTE — Patient Instructions (Addendum)

## 2012-12-07 ENCOUNTER — Telehealth: Payer: Self-pay

## 2012-12-07 DIAGNOSIS — Z79899 Other long term (current) drug therapy: Secondary | ICD-10-CM | POA: Insufficient documentation

## 2012-12-07 DIAGNOSIS — I10 Essential (primary) hypertension: Secondary | ICD-10-CM | POA: Insufficient documentation

## 2012-12-07 NOTE — Telephone Encounter (Signed)
Patient notified and voiced understanding.

## 2012-12-07 NOTE — Progress Notes (Signed)
Subjective:    Patient ID: Kelly Reed, female    DOB: [birth year], 47 y.o.   MRN: [id number] Chief Complaint  Patient presents with  . Follow-up    menopause  . Medication Refill    HPI  Kelly Reed is doing well. She had the cyst on her food removed by Dr. Aurther Loft on 4/28. It was actually 2 large deep ganglion cysts that had to be removed under general anesthesia and a tendon ruptured so she has had a protracted course.  Initiailly she had to be completely nonweightbearing and use a walking.  She is now walking but is not returning to work until 6/3 as there she has to wear tennis shoes which hurt due to compression over the surgical site and walk on concrete floors all day which cause a lot of swelling. Wearing a compression stocking.  She is proud of not gaining to much weight during the recovery. However, we are no longer worried about unintentional weight LOSS.  Her a.m. cbgs have been elevated at 130-135 but then as soon as she eat and druing the day will be normal - abotu 119-122 - even 2 hrs after dinner. Wakes at night to void x 1 but never feels hypoglycemic o/n and no bed-time snack.  She skipper her period this month which she is excited about. 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  Her last period was 4/20 and were regular monthly.  Needs refills on her meds since she hasn't been for work where she usually gets them for free.  Never actually diagnosed w/ high BP. On lisinopril 2.5 for renal protection. BP just goes up when stressed or in pain - high today as had been running errands so foot hurts per pt.  Past Medical History  Diagnosis Date  . Diabetes mellitus type II   . NSVD (normal spontaneous vaginal delivery)     x 2  . Hyperlipidemia   . Anxiety    Current Outpatient Prescriptions on File Prior to Visit  Medication Sig Dispense Refill  .  busPIRone (BUSPAR) 15 MG tablet Take 0.5 tablets (7.5 mg total) by mouth 2 (two) times daily.  30 tablet  12  . calcium-vitamin D (OSCAL WITH D) 500-200 MG-UNIT per tablet Take 1 tablet by mouth daily.      Jennette Banker Sodium 30-100 MG CAPS Take by mouth at bedtime.        . Cetirizine-Pseudoephedrine (ZYRTEC-D PO) Take by mouth daily.        . cholecalciferol (VITAMIN D) 400 UNITS TABS Take by mouth daily.      . cyclobenzaprine (FLEXERIL) 5 MG tablet Take 1/2 to 1 by mouth three times a day as needed       . ferrous sulfate 325 (65 FE) MG tablet Take 325 mg by mouth at bedtime.        . NON FORMULARY Take allergy shots once a week as directed        No current facility-administered medications on file prior to visit.   Allergies  Allergen Reactions  . Prednisone     Hyperglycemia with oral steroids     Review of Systems  Constitutional: Positive for activity change and fatigue. Negative for fever, chills, diaphoresis, appetite change and unexpected weight change.  Eyes: Negative for visual disturbance.  Respiratory: Negative for cough and  shortness of breath.   Cardiovascular: Positive for leg swelling. Negative for chest pain and palpitations.  Gastrointestinal: Positive for nausea.  Endocrine: Negative for polydipsia, polyphagia and polyuria.  Genitourinary: Negative for decreased urine volume and vaginal bleeding.  Musculoskeletal: Positive for joint swelling, arthralgias and gait problem.  Skin: Negative for rash.  Neurological: Negative for syncope and headaches.  Hematological: Does not bruise/bleed easily.      BP 149/78  Pulse 96  Temp(Src) 99.1 F (37.3 C)  Resp 16  Ht 5' 11.5" (1.816 m)  Wt 225 lb (102.059 kg)  BMI 30.95 kg/m2  LMP 10/27/2012 Objective:   Physical Exam  Constitutional: She is oriented to person, place, and time. She appears well-developed and well-nourished. No distress.  HENT:  Head: Normocephalic and atraumatic.  Right Ear:  External ear normal.  Left Ear: External ear normal.  Eyes: Conjunctivae are normal. No scleral icterus.  Neck: Normal range of motion. Neck supple. No thyromegaly present.  Cardiovascular: Normal rate, regular rhythm, normal heart sounds and intact distal pulses.   Pulmonary/Chest: Effort normal and breath sounds normal. No respiratory distress.  Musculoskeletal: She exhibits no edema.  Lymphadenopathy:    She has no cervical adenopathy.  Neurological: She is alert and oriented to person, place, and time.  Skin: Skin is warm and dry. She is not diaphoretic. No erythema.  Psychiatric: She has a normal mood and affect. Her behavior is normal.      Assessment & Plan:  Need for prophylactic vaccination against Streptococcus pneumoniae (pneumococcus) - Plan: Pneumococcal polysaccharide vaccine 23-valent greater than or equal to 2yo subcutaneous/IM  DIABETES MELLITUS, TYPE II - nml microalb 03/2012. Check hgba1c at f/u in 2 mos. If increasing (last was 7.2) consider whether pt is having some reactive hyperglycemia from o/n lows, sunrise effect - consider decreasing evening dose of metformin or adding in bedtime snack.  Ganglion cyst - cont routine post-op care and back to work in 3d.  Hypercholesteremia - lipids excellent.  HYPERTHYROIDISM - all prior tsh in chart nml - unsure about this on problem list so resolved.  Unspecified vitamin D deficiency - resolved. Vit D 2 yrs prev was mid-40s.  HTN - BP often elevated. increase lisinopril from 2.5 to 10.  Mult meds - will need bmp at f/u since increasing acei  Mood - well controlled on 1/2 tab buspar   Meds ordered this encounter  Medications  . lisinopril (PRINIVIL,ZESTRIL) 10 MG tablet    Sig: Take 1 tablet (10 mg total) by mouth at bedtime.    Dispense:  30 tablet    Refill:  2  . fluticasone (FLONASE) 50 MCG/ACT nasal spray    Sig: Place 2 sprays into the nose daily.    Dispense:  16 g    Refill:  3  . DISCONTD: metFORMIN  (GLUCOPHAGE) 1000 MG tablet    Sig: Take 1 tablet (1,000 mg total) by mouth 2 (two) times daily with a meal. Take up to 4 tabs a day    Dispense:  60 tablet    Refill:  3  . pravastatin (PRAVACHOL) 80 MG tablet    Sig: Take 1 tablet (80 mg total) by mouth daily.    Dispense:  90 tablet    Refill:  3  . metFORMIN (GLUCOPHAGE) 1000 MG tablet    Sig: Take 1 tablet (1,000 mg total) by mouth 2 (two) times daily with a meal.    Dispense:  60 tablet    Refill:  3

## 2012-12-07 NOTE — Telephone Encounter (Signed)
Pt seen Dr Clelia Croft yesterday and there was one prescription that did not get called in she said it was the pravasthan Call back number is [phone]  Pharmacy is [facility] on [address]

## 2013-02-07 ENCOUNTER — Ambulatory Visit (INDEPENDENT_AMBULATORY_CARE_PROVIDER_SITE_OTHER): Payer: PRIVATE HEALTH INSURANCE | Admitting: Family Medicine

## 2013-02-07 ENCOUNTER — Encounter: Payer: Self-pay | Admitting: Family Medicine

## 2013-02-07 VITALS — BP 136/86 | HR 80 | Temp 97.7°F | Resp 16 | Ht 70.5 in | Wt 218.0 lb

## 2013-02-07 DIAGNOSIS — I10 Essential (primary) hypertension: Secondary | ICD-10-CM

## 2013-02-07 DIAGNOSIS — Z79899 Other long term (current) drug therapy: Secondary | ICD-10-CM

## 2013-02-07 DIAGNOSIS — E119 Type 2 diabetes mellitus without complications: Secondary | ICD-10-CM

## 2013-02-07 LAB — BASIC METABOLIC PANEL
Chloride: 104 mEq/L (ref 96–112)
Creat: 0.64 mg/dL (ref 0.50–1.10)
Potassium: 4.3 mEq/L (ref 3.5–5.3)
Sodium: 136 mEq/L (ref 135–145)

## 2013-02-07 NOTE — Progress Notes (Signed)
Subjective:    Patient ID: Kelly Reed, female    DOB: 07/29/1965, 47 y.o.   MRN: [id number] Chief Complaint  Patient presents with  . Follow-up    LABWORK    HPI  Periods really erratic and more cramping - sometimes twice a month. Even 800 mg of ibuprofen wouldn't help. CBGs great during day but for some reason 130-140s. Really watching diet closely as husbands cbgs were elevated too. Peeig a lot during day. After btl in 1992, was put on hormone pills and made her loopy. Cough and scratchy throat today  Past Medical History  Diagnosis Date  . Diabetes mellitus type II   . NSVD (normal spontaneous vaginal delivery)     x 2  . Hyperlipidemia   . Anxiety    Current Outpatient Prescriptions on File Prior to Visit  Medication Sig Dispense Refill  . busPIRone (BUSPAR) 15 MG tablet Take 0.5 tablets (7.5 mg total) by mouth 2 (two) times daily.  30 tablet  12  . calcium-vitamin D (OSCAL WITH D) 500-200 MG-UNIT per tablet Take 1 tablet by mouth daily.      Jennette Banker Sodium 30-100 MG CAPS Take by mouth at bedtime.        . Cetirizine-Pseudoephedrine (ZYRTEC-D PO) Take by mouth daily.        . cholecalciferol (VITAMIN D) 400 UNITS TABS Take by mouth daily.      . cyclobenzaprine (FLEXERIL) 5 MG tablet Take 1/2 to 1 by mouth three times a day as needed       . ferrous sulfate 325 (65 FE) MG tablet Take 325 mg by mouth at bedtime.        . fluticasone (FLONASE) 50 MCG/ACT nasal spray Place 2 sprays into the nose daily.  16 g  3  . metFORMIN (GLUCOPHAGE) 1000 MG tablet Take 1 tablet (1,000 mg total) by mouth 2 (two) times daily with a meal.  60 tablet  3  . pravastatin (PRAVACHOL) 80 MG tablet Take 1 tablet (80 mg total) by mouth daily.  90 tablet  3  . NON FORMULARY Take allergy shots once a week as directed        No current facility-administered medications on file prior to visit.   Allergies  Allergen Reactions  . Prednisone     Hyperglycemia with oral steroids     Review of Systems    BP 136/86  Pulse 80  Temp(Src) 97.7 F (36.5 C) (Oral)  Resp 16  Ht 5' 10.5" (1.791 m)  Wt 218 lb (98.884 kg)  BMI 30.83 kg/m2  SpO2 100%  LMP 01/25/2013  Objective:   Physical Exam        Results for orders placed in visit on 02/07/13  BASIC METABOLIC PANEL      Result Value Range   Sodium 136  135 - 145 mEq/L   Potassium 4.3  3.5 - 5.3 mEq/L   Chloride 104  96 - 112 mEq/L   CO2 24  19 - 32 mEq/L   Glucose, Bld 114 (*) 70 - 99 mg/dL   BUN 9  6 - 23 mg/dL   Creat 7.82  9.56 - 2.13 mg/dL   Calcium 8.8  8.4 - 08.6 mg/dL  POCT GLYCOSYLATED HEMOGLOBIN (HGB A1C)      Result Value Range   Hemoglobin A1C 6.0      Assessment & Plan:  DIABETES MELLITUS, TYPE II - Plan: POCT glycosylated hemoglobin (Hb A1C)  Encounter for long-term (current) use of  other medications - Plan: Basic metabolic panel

## 2013-02-17 ENCOUNTER — Encounter: Payer: Self-pay | Admitting: Family Medicine

## 2013-03-09 ENCOUNTER — Other Ambulatory Visit: Payer: Self-pay | Admitting: Family Medicine

## 2013-04-01 ENCOUNTER — Telehealth: Payer: Self-pay

## 2013-04-01 NOTE — Telephone Encounter (Signed)
Pt is needing to talk with dr Clelia Croft about calling in her bustiorne to [facility] [address]  Best number [phone] ext [phone]

## 2013-04-02 MED ORDER — BUSPIRONE HCL 15 MG PO TABS: 7.5000 mg | ORAL_TABLET | Freq: Two times a day (BID) | ORAL | Status: DC

## 2013-04-02 NOTE — Telephone Encounter (Signed)
Sent to [facility]

## 2013-04-02 NOTE — Telephone Encounter (Signed)
Patient advised.

## 2013-04-02 NOTE — Telephone Encounter (Signed)
Her what? Called her. Left message for her to call me back.

## 2013-04-02 NOTE — Telephone Encounter (Signed)
Patient wants buspirone states you were going to give this to her, but it was not sent in. She states 15 mg.

## 2013-05-08 ENCOUNTER — Ambulatory Visit: Payer: Self-pay | Admitting: General Practice

## 2013-06-18 ENCOUNTER — Ambulatory Visit: Payer: Self-pay | Admitting: General Practice

## 2013-08-01 ENCOUNTER — Encounter: Payer: Self-pay | Admitting: Family Medicine

## 2013-08-01 ENCOUNTER — Ambulatory Visit (INDEPENDENT_AMBULATORY_CARE_PROVIDER_SITE_OTHER): Payer: PRIVATE HEALTH INSURANCE | Admitting: Family Medicine

## 2013-08-01 VITALS — BP 120/70 | HR 73 | Temp 98.3°F | Resp 16 | Ht 70.5 in | Wt 208.6 lb

## 2013-08-01 DIAGNOSIS — E119 Type 2 diabetes mellitus without complications: Secondary | ICD-10-CM

## 2013-08-01 DIAGNOSIS — Z79899 Other long term (current) drug therapy: Secondary | ICD-10-CM

## 2013-08-01 DIAGNOSIS — E785 Hyperlipidemia, unspecified: Secondary | ICD-10-CM

## 2013-08-01 DIAGNOSIS — F4323 Adjustment disorder with mixed anxiety and depressed mood: Secondary | ICD-10-CM

## 2013-08-01 DIAGNOSIS — E663 Overweight: Secondary | ICD-10-CM

## 2013-08-01 LAB — LIPID PANEL
CHOL/HDL RATIO: 2.3 ratio
CHOLESTEROL: 159 mg/dL (ref 0–200)
HDL: 69 mg/dL (ref >39)
LDL Cholesterol: 72 mg/dL (ref 0–99)
Triglycerides: 89 mg/dL (ref <150)
VLDL: 18 mg/dL (ref 0–40)

## 2013-08-01 LAB — COMPREHENSIVE METABOLIC PANEL
ALT: 16 U/L (ref 0–35)
AST: 18 U/L (ref 0–37)
Albumin: 4.5 g/dL (ref 3.5–5.2)
Alkaline Phosphatase: 46 U/L (ref 39–117)
BILIRUBIN TOTAL: 0.4 mg/dL (ref 0.3–1.2)
BUN: 12 mg/dL (ref 6–23)
CALCIUM: 9.3 mg/dL (ref 8.4–10.5)
CO2: 25 mEq/L (ref 19–32)
CREATININE: 0.64 mg/dL (ref 0.50–1.10)
Chloride: 104 mEq/L (ref 96–112)
Glucose, Bld: 110 mg/dL — ABNORMAL HIGH (ref 70–99)
Potassium: 4.3 mEq/L (ref 3.5–5.3)
Sodium: 139 mEq/L (ref 135–145)
Total Protein: 6.9 g/dL (ref 6.0–8.3)

## 2013-08-01 LAB — MICROALBUMIN, URINE: Microalb, Ur: 0.5 mg/dL (ref 0.00–1.89)

## 2013-08-01 LAB — TSH: TSH: 0.9 u[IU]/mL (ref 0.350–4.500)

## 2013-08-01 LAB — POCT GLYCOSYLATED HEMOGLOBIN (HGB A1C): HEMOGLOBIN A1C: 5.9

## 2013-08-01 NOTE — Progress Notes (Signed)
Subjective:    Patient ID: Kelly Reed, female    DOB: March 12, 1966, 48 y.o.   MRN: [id number]  Chief Complaint  Patient presents with  . Diabetes  . Medication Refill   This chart was scribed for Kelly Knapp, MD by Zettie Pho, ED Scribe.   HPI Kelly Reed is a 48 y.o. Female with a history of type II DM, hypercholesterolemia, hypertension, and depression who presents to [facility] for a diabetes follow-up. Patient was seen 6 months prior for well-controlled diabetes and her last hemoglobin A1c was 6.0. Patient reports that her blood sugar has been well-controlled, running from 90-110 in the morning. She denies any problems with her medications.   Patient was also started on Buspar for anxiety. She states that she has experienced some recent stress secondary to taking care of her ill mother and her brother passing away 3 months ago, but that the Buspar has been otherwise effective at managing her anxiety.   Patient's last lipid panel done was performed 1 year previously through her occupational health and was at-goal. Patient's lipid panel has not been checked since then. She states that she has not eaten today.   Patient states that her last visit to the eye doctor was in June, 2014, and was normal.  Patient states she has been having some issues with her ankle, but has followed up for this. She states that she was told she may have had an old, unrepaired fracture that is the likely source of the pain. Patient will start using in-soles for her shoes to help with this.   Past Medical History  Diagnosis Date  . Diabetes mellitus type II   . NSVD (normal spontaneous vaginal delivery)     x 2  . Hyperlipidemia   . Anxiety    Current Outpatient Prescriptions on File Prior to Visit  Medication Sig Dispense Refill  . busPIRone (BUSPAR) 15 MG tablet Take 0.5 tablets (7.5 mg total) by mouth 2 (two) times daily.  30 tablet  12  . calcium-vitamin D (OSCAL WITH D) 500-200  MG-UNIT per tablet Take 1 tablet by mouth daily.      Sarajane Marek Sodium 30-100 MG CAPS Take by mouth at bedtime.        . Cetirizine-Pseudoephedrine (ZYRTEC-D PO) Take by mouth daily.        . cholecalciferol (VITAMIN D) 400 UNITS TABS Take by mouth daily.      . cyclobenzaprine (FLEXERIL) 5 MG tablet Take 1/2 to 1 by mouth three times a day as needed       . ferrous sulfate 325 (65 FE) MG tablet Take 325 mg by mouth at bedtime.        . fluticasone (FLONASE) 50 MCG/ACT nasal spray Place 2 sprays into the nose daily.  16 g  3  . lisinopril (PRINIVIL,ZESTRIL) 10 MG tablet TAKE 1 TABLET BY MOUTH EVERY NIGHT AT BEDTIME  30 tablet  5  . metFORMIN (GLUCOPHAGE) 1000 MG tablet Take 1 tablet (1,000 mg total) by mouth 2 (two) times daily with a meal.  60 tablet  3  . pravastatin (PRAVACHOL) 80 MG tablet Take 1 tablet (80 mg total) by mouth daily.  90 tablet  3  . NON FORMULARY Take allergy shots once a week as directed        No current facility-administered medications on file prior to visit.   Allergies  Allergen Reactions  . Prednisone     Hyperglycemia with  oral steroids   Review of Systems  Constitutional: Negative for fever, chills, activity change, appetite change and unexpected weight change.  Respiratory: Negative for cough.   Gastrointestinal: Negative for vomiting, abdominal pain, diarrhea and constipation.  Musculoskeletal: Positive for arthralgias.  Skin: Negative for rash.  Neurological: Negative for dizziness and seizures.     Vitals: BP 120/70  Pulse 73  Temp(Src) 98.3 F (36.8 C) (Oral)  Resp 16  Ht 5' 10.5" (1.791 m)  Wt 208 lb 9.6 oz (94.62 kg)  BMI 29.50 kg/m2  SpO2 99%  LMP 06/28/2013  Objective:   Physical Exam  Nursing note and vitals reviewed. Constitutional: She is oriented to person, place, and time. She appears well-developed and well-nourished. No distress.  HENT:  Head: Normocephalic and atraumatic.  Eyes: Conjunctivae are normal.  Neck:  Normal range of motion. Neck supple.  Cardiovascular: Normal rate, regular rhythm and normal heart sounds.   Pulmonary/Chest: Effort normal and breath sounds normal. No respiratory distress.  Abdominal: She exhibits no distension.  Musculoskeletal: Normal range of motion.  Neurological: She is alert and oriented to person, place, and time.  Skin: Skin is warm and dry.  Psychiatric: She has a normal mood and affect. Her behavior is normal.      Results for orders placed in visit on 08/01/13  POCT GLYCOSYLATED HEMOGLOBIN (HGB A1C)      Result Value Range   Hemoglobin A1C 5.9      Assessment & Plan:  8:17 AM-  Type II or unspecified type diabetes mellitus without mention of complication, not stated as uncontrolled - Plan: [facility] Diabetes Foot Exam, Comprehensive metabolic panel, Lipid panel, TSH, POCT glycosylated hemoglobin (Hb A1C), Microalbumin, urine - Doing great. Recheck in 6 mos.  Adjustment disorder with mixed anxiety and depressed mood  Other and unspecified hyperlipidemia  Encounter for long-term (current) use of other medications  Overweight (BMI 25.0-29.9)  No orders of the defined types were placed in this encounter.   Discussed treatment plan with patient at bedside and patient verbalized agreement.   I personally performed the services described in this documentation, which was scribed in my presence. The recorded information has been reviewed and considered, and addended by me as needed.  [name], MD MPH

## 2013-08-02 ENCOUNTER — Encounter: Payer: Self-pay | Admitting: Family Medicine

## 2013-08-15 ENCOUNTER — Ambulatory Visit: Payer: PRIVATE HEALTH INSURANCE | Admitting: Family Medicine

## 2013-09-08 ENCOUNTER — Ambulatory Visit (INDEPENDENT_AMBULATORY_CARE_PROVIDER_SITE_OTHER): Payer: PRIVATE HEALTH INSURANCE | Admitting: Family Medicine

## 2013-09-08 ENCOUNTER — Other Ambulatory Visit: Payer: Self-pay | Admitting: Family Medicine

## 2013-09-08 VITALS — BP 108/66 | HR 87 | Temp 98.2°F | Resp 16 | Ht 70.5 in | Wt 212.8 lb

## 2013-09-08 DIAGNOSIS — H109 Unspecified conjunctivitis: Secondary | ICD-10-CM

## 2013-09-08 DIAGNOSIS — J329 Chronic sinusitis, unspecified: Secondary | ICD-10-CM

## 2013-09-08 MED ORDER — TOBRAMYCIN 0.3 % OP SOLN: 1.0000 [drp] | Freq: Four times a day (QID) | OPHTHALMIC | Status: DC

## 2013-09-08 MED ORDER — LEVOFLOXACIN 500 MG PO TABS: 500.0000 mg | ORAL_TABLET | Freq: Every day | ORAL | Status: DC

## 2013-09-08 NOTE — Patient Instructions (Signed)

## 2013-09-08 NOTE — Progress Notes (Signed)
   Subjective:    Patient ID: Kelly Reed, female    DOB: 1965-12-31, 48 y.o.   MRN: [id number]  HPI  48 y.o. Female whom works for [employer] presents to clinic with sinus pressure/pain, watery eyes and congestion that started on Friday. States that Sunday she woke up with her eyes matted.  Review of Systems No fever, cough She has had eye mattering.    Objective:   Physical Exam HEENT:  Normal TM's, nose-mucopurulent discharge, throat normal Neck:  Supple, no adenop or thyromegaly Eyes:  Mildly injected, right worse than left Chest:  Clear Heart: reg, no murmur     Assessment & Plan:  Sinusitis - Plan: levofloxacin (LEVAQUIN) 500 MG tablet  Conjunctivitis - Plan: tobramycin (TOBREX) 0.3 % ophthalmic solution  Signed, Robyn Haber, MD

## 2013-12-25 ENCOUNTER — Ambulatory Visit (INDEPENDENT_AMBULATORY_CARE_PROVIDER_SITE_OTHER): Payer: PRIVATE HEALTH INSURANCE | Admitting: Family Medicine

## 2013-12-25 VITALS — BP 124/82 | HR 84 | Temp 98.1°F | Resp 18 | Ht 70.5 in | Wt 202.8 lb

## 2013-12-25 DIAGNOSIS — I10 Essential (primary) hypertension: Secondary | ICD-10-CM

## 2013-12-25 DIAGNOSIS — T7840XA Allergy, unspecified, initial encounter: Secondary | ICD-10-CM

## 2013-12-25 DIAGNOSIS — E119 Type 2 diabetes mellitus without complications: Secondary | ICD-10-CM

## 2013-12-25 DIAGNOSIS — J018 Other acute sinusitis: Secondary | ICD-10-CM

## 2013-12-25 DIAGNOSIS — J0141 Acute recurrent pansinusitis: Secondary | ICD-10-CM

## 2013-12-25 MED ORDER — AZELASTINE HCL 0.15 % NA SOLN: 1.0000 | Freq: Two times a day (BID) | NASAL | Status: DC

## 2013-12-25 MED ORDER — FLUTICASONE PROPIONATE 50 MCG/ACT NA SUSP: 2.0000 | Freq: Every day | NASAL | Status: DC

## 2013-12-25 MED ORDER — LEVOCETIRIZINE DIHYDROCHLORIDE 5 MG PO TABS
5.0000 mg | ORAL_TABLET | Freq: Every evening | ORAL | Status: DC
Start: 2013-12-25 — End: 2014-01-30

## 2013-12-25 MED ORDER — PSEUDOEPHEDRINE HCL ER 120 MG PO TB12: 120.0000 mg | ORAL_TABLET | Freq: Two times a day (BID) | ORAL | Status: DC

## 2013-12-25 MED ORDER — HYDROCOD POLST-CHLORPHEN POLST 10-8 MG/5ML PO LQCR: 5.0000 mL | Freq: Two times a day (BID) | ORAL | Status: DC | PRN

## 2013-12-25 MED ORDER — AZITHROMYCIN 250 MG PO TABS: ORAL_TABLET | ORAL | Status: DC

## 2013-12-25 NOTE — Patient Instructions (Signed)
Sinusitis Sinusitis is redness, soreness, and swelling (inflammation) of the paranasal sinuses. Paranasal sinuses are air pockets within the bones of your face (beneath the eyes, the middle of the forehead, or above the eyes). In healthy paranasal sinuses, mucus is able to drain out, and air is able to circulate through them by way of your nose. However, when your paranasal sinuses are inflamed, mucus and air can become trapped. This can allow bacteria and other germs to grow and cause infection. Sinusitis can develop quickly and last only a short time (acute) or continue over a long period (chronic). Sinusitis that lasts for more than 12 weeks is considered chronic.  CAUSES  Causes of sinusitis include:  Allergies.  Structural abnormalities, such as displacement of the cartilage that separates your nostrils (deviated septum), which can decrease the air flow through your nose and sinuses and affect sinus drainage.  Functional abnormalities, such as when the small hairs (cilia) that line your sinuses and help remove mucus do not work properly or are not present. SYMPTOMS  Symptoms of acute and chronic sinusitis are the same. The primary symptoms are pain and pressure around the affected sinuses. Other symptoms include:  Upper toothache.  Earache.  Headache.  Bad breath.  Decreased sense of smell and taste.  A cough, which worsens when you are lying flat.  Fatigue.  Fever.  Thick drainage from your nose, which often is green and may contain pus (purulent).  Swelling and warmth over the affected sinuses. DIAGNOSIS  Your caregiver will perform a physical exam. During the exam, your caregiver may:  Look in your nose for signs of abnormal growths in your nostrils (nasal polyps).  Tap over the affected sinus to check for signs of infection.  View the inside of your sinuses (endoscopy) with a special imaging device with a light attached (endoscope), which is inserted into your  sinuses. If your caregiver suspects that you have chronic sinusitis, one or more of the following tests may be recommended:  Allergy tests.  Nasal culture--A sample of mucus is taken from your nose and sent to a lab and screened for bacteria.  Nasal cytology--A sample of mucus is taken from your nose and examined by your caregiver to determine if your sinusitis is related to an allergy. TREATMENT  Most cases of acute sinusitis are related to a viral infection and will resolve on their own within 10 days. Sometimes medicines are prescribed to help relieve symptoms (pain medicine, decongestants, nasal steroid sprays, or saline sprays).  However, for sinusitis related to a bacterial infection, your caregiver will prescribe antibiotic medicines. These are medicines that will help kill the bacteria causing the infection.  Rarely, sinusitis is caused by a fungal infection. In theses cases, your caregiver will prescribe antifungal medicine. For some cases of chronic sinusitis, surgery is needed. Generally, these are cases in which sinusitis recurs more than 3 times per year, despite other treatments. HOME CARE INSTRUCTIONS   Drink plenty of water. Water helps thin the mucus so your sinuses can drain more easily.  Use a humidifier.  Inhale steam 3 to 4 times a day (for example, sit in the bathroom with the shower running).  Apply a warm, moist washcloth to your face 3 to 4 times a day, or as directed by your caregiver.  Use saline nasal sprays to help moisten and clean your sinuses.  Take over-the-counter or prescription medicines for pain, discomfort, or fever only as directed by your caregiver. SEEK IMMEDIATE MEDICAL CARE IF:    You have increasing pain or severe headaches.  You have nausea, vomiting, or drowsiness.  You have swelling around your face.  You have vision problems.  You have a stiff neck.  You have difficulty breathing. MAKE SURE YOU:   Understand these  instructions.  Will watch your condition.  Will get help right away if you are not doing well or get worse. Document Released: 06/26/2005 Document Revised: 09/18/2011 Document Reviewed: 07/11/2011 [employer] Patient Information 2015 [employer]. This information is not intended to replace advice given to you by your health care Dodie Parisi. Make sure you discuss any questions you have with your health care Oralee Rapaport.  

## 2013-12-25 NOTE — Progress Notes (Signed)
Subjective:    Patient ID: Kelly Reed, female    DOB: 08/05/1965, 48 y.o.   MRN: [id number]   This chart was scribed for Laurey Arrow. Brigitte Pulse, MD by Steva Colder, ED Scribe. The patient was seen at 12:39 PM.   Chief Complaint  Patient presents with  . Sinusitis    x 4 days    HPI  Kelly Reed is a 48 y.o. female who presents today complaining of sinusitis onset 4 days ago. She states that she can not blow her nose. She states that she is coughing at night and first thing in the morning and is not coughing anything up She takes Zyrtec-D and Flonase everyday. She has not taken Zyrtec-D since Monday because she ran out. She states that uses a humidifier at night. She states that she also took Sudafed sinus which initially gave her relief but is no longer working.   She states that she has associated symptoms of HA, sinus pain and pressure, max fever of 101.1 max. She states that she is sleeping at night when she is at her home but not when she is at her mothers home because of the A/C not being on.  She states that the last time that she was on 580 mg of Amoxicillin it did not work for her. She denies chills and any other associated symptoms. She states that she used a [employer] did not work because of her deviated septum.   Past Medical History  Diagnosis Date  . Diabetes mellitus type II   . NSVD (normal spontaneous vaginal delivery)     x 2  . Hyperlipidemia   . Anxiety    Current Outpatient Prescriptions on File Prior to Visit  Medication Sig Dispense Refill  . busPIRone (BUSPAR) 15 MG tablet Take 0.5 tablets (7.5 mg total) by mouth 2 (two) times daily.  30 tablet  12  . calcium-vitamin D (OSCAL WITH D) 500-200 MG-UNIT per tablet Take 1 tablet by mouth daily.      Sarajane Marek Sodium 30-100 MG CAPS Take by mouth at bedtime.        . Cetirizine-Pseudoephedrine (ZYRTEC-D PO) Take by mouth daily.        . cholecalciferol (VITAMIN D) 400 UNITS TABS Take by mouth daily.      .  cyclobenzaprine (FLEXERIL) 5 MG tablet Take 1/2 to 1 by mouth three times a day as needed       . ferrous sulfate 325 (65 FE) MG tablet Take 325 mg by mouth at bedtime.        . fluticasone (FLONASE) 50 MCG/ACT nasal spray Place 2 sprays into the nose daily.  16 g  3  . lisinopril (PRINIVIL,ZESTRIL) 10 MG tablet TAKE 1 TABLET BY MOUTH EVERY NIGHT AT BEDTIME  30 tablet  4  . metFORMIN (GLUCOPHAGE) 1000 MG tablet Take 1 tablet (1,000 mg total) by mouth 2 (two) times daily with a meal.  60 tablet  3  . pravastatin (PRAVACHOL) 80 MG tablet Take 1 tablet (80 mg total) by mouth daily.  90 tablet  3  . tobramycin (TOBREX) 0.3 % ophthalmic solution Place 1 drop into both eyes every 6 (six) hours.  5 mL  0   No current facility-administered medications on file prior to visit.   Allergies  Allergen Reactions  . Prednisone     Hyperglycemia with oral steroids    Review of Systems  Constitutional: Positive for fever (Max temp of 101.1). Negative for chills.  HENT: Positive for sinus pressure. Negative for ear pain and rhinorrhea.   Respiratory: Positive for cough (only at night and early morning).   Musculoskeletal: Negative for neck pain.  Neurological: Positive for headaches. Negative for weakness.  Psychiatric/Behavioral: Positive for sleep disturbance (on the weekends when she is taking care of her mother because of lack of AC).      BP 124/82  Pulse 84  Temp(Src) 98.1 F (36.7 C) (Oral)  Resp 18  Ht 5' 10.5" (1.791 m)  Wt 202 lb 12.8 oz (91.989 kg)  BMI 28.68 kg/m2  SpO2 99%  LMP 11/26/2013  Objective:   Physical Exam  Nursing note and vitals reviewed. Constitutional: She is oriented to person, place, and time. She appears well-developed and well-nourished. No distress.  HENT:  Head: Normocephalic and atraumatic.  Right Ear: Tympanic membrane is retracted. A middle ear effusion is present.  Left Ear: Tympanic membrane is retracted. A middle ear effusion is present.  Nose: Septal  deviation (deviated to the left) present.  Mouth/Throat: Oropharynx is clear and moist and mucous membranes are normal.  Frontal and maxillary sinus pain. Diffused sinus tenderness.    Eyes: EOM are normal.  Neck: Neck supple. No tracheal deviation present.  Cardiovascular: Normal rate, regular rhythm and normal heart sounds.   No murmur heard. Pulmonary/Chest: Effort normal and breath sounds normal. No respiratory distress.  Musculoskeletal: Normal range of motion.  Lymphadenopathy:    She has no cervical adenopathy.  Neurological: She is alert and oriented to person, place, and time.  Skin: Skin is warm and dry.  Psychiatric: She has a normal mood and affect. Her behavior is normal.       Assessment & Plan:  COORDINATION OF CARE: 12:57 PM-Discussed treatment plan with pt at bedside and pt agreed to plan.  Acute recurrent pansinusitis  DIABETES MELLITUS, TYPE II  Essential hypertension, benign  ALLERGY  Meds ordered this encounter  Medications  . fluticasone (FLONASE) 50 MCG/ACT nasal spray    Sig: Place 2 sprays into both nostrils daily.    Dispense:  16 g    Refill:  3  . levocetirizine (XYZAL) 5 MG tablet    Sig: Take 1 tablet (5 mg total) by mouth every evening.    Dispense:  30 tablet    Refill:  1  . Azelastine HCl 0.15 % SOLN    Sig: Place 1 spray into the nose 2 (two) times daily.    Dispense:  30 mL    Refill:  1  . pseudoephedrine (SUDAFED 12 HOUR) 120 MG 12 hr tablet    Sig: Take 1 tablet (120 mg total) by mouth 2 (two) times daily.    Dispense:  30 tablet    Refill:  0  . azithromycin (ZITHROMAX) 250 MG tablet    Sig: Take 2 tabs PO x 1 dose, then 1 tab PO QD x 4 days    Dispense:  6 tablet    Refill:  0  . chlorpheniramine-HYDROcodone (TUSSIONEX PENNKINETIC ER) 10-8 MG/5ML LQCR    Sig: Take 5 mLs by mouth every 12 (twelve) hours as needed.    Dispense:  120 mL    Refill:  0    I personally performed the services described in this documentation,  which was scribed in my presence. The recorded information has been reviewed and considered, and addended by me as needed.  [name], MD MPH

## 2014-01-30 ENCOUNTER — Ambulatory Visit (INDEPENDENT_AMBULATORY_CARE_PROVIDER_SITE_OTHER): Payer: PRIVATE HEALTH INSURANCE | Admitting: Family Medicine

## 2014-01-30 ENCOUNTER — Ambulatory Visit: Payer: PRIVATE HEALTH INSURANCE | Admitting: Family Medicine

## 2014-01-30 ENCOUNTER — Encounter: Payer: Self-pay | Admitting: Family Medicine

## 2014-01-30 VITALS — BP 120/74 | HR 85 | Temp 98.1°F | Resp 16 | Ht 70.25 in | Wt 205.0 lb

## 2014-01-30 DIAGNOSIS — E7800 Pure hypercholesterolemia, unspecified: Secondary | ICD-10-CM

## 2014-01-30 DIAGNOSIS — I10 Essential (primary) hypertension: Secondary | ICD-10-CM

## 2014-01-30 DIAGNOSIS — E559 Vitamin D deficiency, unspecified: Secondary | ICD-10-CM

## 2014-01-30 DIAGNOSIS — T7840XA Allergy, unspecified, initial encounter: Secondary | ICD-10-CM

## 2014-01-30 DIAGNOSIS — E119 Type 2 diabetes mellitus without complications: Secondary | ICD-10-CM

## 2014-01-30 DIAGNOSIS — J320 Chronic maxillary sinusitis: Secondary | ICD-10-CM

## 2014-01-30 DIAGNOSIS — Z79899 Other long term (current) drug therapy: Secondary | ICD-10-CM

## 2014-01-30 LAB — POCT CBC
Granulocyte percent: 73.8 %G (ref 37–80)
HEMATOCRIT: 40.7 % (ref 37.7–47.9)
Hemoglobin: 13.5 g/dL (ref 12.2–16.2)
Lymph, poc: 1.7 (ref 0.6–3.4)
MCH, POC: 30.3 pg (ref 27–31.2)
MCHC: 33.1 g/dL (ref 31.8–35.4)
MCV: 91.5 fL (ref 80–97)
MID (cbc): 0.2 (ref 0–0.9)
MPV: 8.8 fL (ref 0–99.8)
POC Granulocyte: 5.4 (ref 2–6.9)
POC LYMPH PERCENT: 23.2 %L (ref 10–50)
POC MID %: 3 %M (ref 0–12)
Platelet Count, POC: 234 10*3/uL (ref 142–424)
RBC: 4.44 M/uL (ref 4.04–5.48)
RDW, POC: 13.9 %
WBC: 7.3 10*3/uL (ref 4.6–10.2)

## 2014-01-30 LAB — COMPREHENSIVE METABOLIC PANEL
ALT: 13 U/L (ref 0–35)
AST: 15 U/L (ref 0–37)
Albumin: 4.5 g/dL (ref 3.5–5.2)
Alkaline Phosphatase: 41 U/L (ref 39–117)
BUN: 12 mg/dL (ref 6–23)
CALCIUM: 9.5 mg/dL (ref 8.4–10.5)
CO2: 26 meq/L (ref 19–32)
Chloride: 102 mEq/L (ref 96–112)
Creat: 0.64 mg/dL (ref 0.50–1.10)
GLUCOSE: 102 mg/dL — AB (ref 70–99)
Potassium: 4.7 mEq/L (ref 3.5–5.3)
SODIUM: 137 meq/L (ref 135–145)
TOTAL PROTEIN: 6.9 g/dL (ref 6.0–8.3)
Total Bilirubin: 0.3 mg/dL (ref 0.2–1.2)

## 2014-01-30 LAB — LIPID PANEL
Cholesterol: 142 mg/dL (ref 0–200)
HDL: 64 mg/dL (ref >39)
LDL CALC: 58 mg/dL (ref 0–99)
Total CHOL/HDL Ratio: 2.2 Ratio
Triglycerides: 101 mg/dL (ref <150)
VLDL: 20 mg/dL (ref 0–40)

## 2014-01-30 LAB — FERRITIN: FERRITIN: 10 ng/mL (ref 10–291)

## 2014-01-30 LAB — POCT GLYCOSYLATED HEMOGLOBIN (HGB A1C): Hemoglobin A1C: 5.6

## 2014-01-30 MED ORDER — LEVOFLOXACIN 500 MG PO TABS: 500.0000 mg | ORAL_TABLET | Freq: Every day | ORAL | Status: DC

## 2014-01-30 MED ORDER — BUSPIRONE HCL 15 MG PO TABS: 7.5000 mg | ORAL_TABLET | Freq: Two times a day (BID) | ORAL | Status: DC

## 2014-01-30 MED ORDER — LISINOPRIL 10 MG PO TABS: ORAL_TABLET | ORAL | Status: DC

## 2014-01-30 NOTE — Patient Instructions (Signed)
Hot showers or breathing in steam may help loosen the congestion.  Using a netti pot or sinus rinse is also likely to help you feel better and keep this from progressing.  I recommend augmenting with 12 hr sudafed (behind the counter) and generic mucinex to help you move out the congestion.  If no improvement or you are getting worse, come back as you might need a course of steroids but hopefully with all of the above, you can avoid it.    Sinusitis Sinusitis is redness, soreness, and inflammation of the paranasal sinuses. Paranasal sinuses are air pockets within the bones of your face (beneath the eyes, the middle of the forehead, or above the eyes). In healthy paranasal sinuses, mucus is able to drain out, and air is able to circulate through them by way of your nose. However, when your paranasal sinuses are inflamed, mucus and air can become trapped. This can allow bacteria and other germs to grow and cause infection. Sinusitis can develop quickly and last only a short time (acute) or continue over a long period (chronic). Sinusitis that lasts for more than 12 weeks is considered chronic.  CAUSES  Causes of sinusitis include:  Allergies.  Structural abnormalities, such as displacement of the cartilage that separates your nostrils (deviated septum), which can decrease the air flow through your nose and sinuses and affect sinus drainage.  Functional abnormalities, such as when the small hairs (cilia) that line your sinuses and help remove mucus do not work properly or are not present. SIGNS AND SYMPTOMS  Symptoms of acute and chronic sinusitis are the same. The primary symptoms are pain and pressure around the affected sinuses. Other symptoms include:  Upper toothache.  Earache.  Headache.  Bad breath.  Decreased sense of smell and taste.  A cough, which worsens when you are lying flat.  Fatigue.  Fever.  Thick drainage from your nose, which often is green and may contain pus  (purulent).  Swelling and warmth over the affected sinuses. DIAGNOSIS  Your health care provider will perform a physical exam. During the exam, your health care provider may:  Look in your nose for signs of abnormal growths in your nostrils (nasal polyps).  Tap over the affected sinus to check for signs of infection.  View the inside of your sinuses (endoscopy) using an imaging device that has a light attached (endoscope). If your health care provider suspects that you have chronic sinusitis, one or more of the following tests may be recommended:  Allergy tests.  Nasal culture. A sample of mucus is taken from your nose, sent to a lab, and screened for bacteria.  Nasal cytology. A sample of mucus is taken from your nose and examined by your health care provider to determine if your sinusitis is related to an allergy. TREATMENT  Most cases of acute sinusitis are related to a viral infection and will resolve on their own within 10 days. Sometimes medicines are prescribed to help relieve symptoms (pain medicine, decongestants, nasal steroid sprays, or saline sprays).  However, for sinusitis related to a bacterial infection, your health care provider will prescribe antibiotic medicines. These are medicines that will help kill the bacteria causing the infection.  Rarely, sinusitis is caused by a fungal infection. In theses cases, your health care provider will prescribe antifungal medicine. For some cases of chronic sinusitis, surgery is needed. Generally, these are cases in which sinusitis recurs more than 3 times per year, despite other treatments. HOME CARE INSTRUCTIONS  Drink plenty of water. Water helps thin the mucus so your sinuses can drain more easily.  Use a humidifier.  Inhale steam 3 to 4 times a day (for example, sit in the bathroom with the shower running).  Apply a warm, moist washcloth to your face 3 to 4 times a day, or as directed by your health care provider.  Use  saline nasal sprays to help moisten and clean your sinuses.  Take medicines only as directed by your health care provider.  If you were prescribed either an antibiotic or antifungal medicine, finish it all even if you start to feel better. SEEK IMMEDIATE MEDICAL CARE IF:  You have increasing pain or severe headaches.  You have nausea, vomiting, or drowsiness.  You have swelling around your face.  You have vision problems.  You have a stiff neck.  You have difficulty breathing. MAKE SURE YOU:   Understand these instructions.  Will watch your condition.  Will get help right away if you are not doing well or get worse. Document Released: 06/26/2005 Document Revised: 11/10/2013 Document Reviewed: 07/11/2011 [facility] Patient Information 2015 [place], [place]. This information is not intended to replace advice given to you by your health care provider. Make sure you discuss any questions you have with your health care provider.

## 2014-01-30 NOTE — Progress Notes (Signed)
Subjective:    Patient ID: Kelly Reed, female    DOB: 02-Mar-1966, 48 y.o.   MRN: [id number] This chart was scribed for Shawnee Knapp, MD by Cathie Hoops, ED Scribe. The patient was seen in Room 27. The patient's care was started at 10:56 AM.   Chief Complaint  Patient presents with  . Follow-up    diabetes, medications, and sinus with pressure/pain   Current Outpatient Prescriptions on File Prior to Visit  Medication Sig Dispense Refill  . Azelastine HCl 0.15 % SOLN Place 1 spray into the nose 2 (two) times daily.  30 mL  1  . azithromycin (ZITHROMAX) 250 MG tablet Take 2 tabs PO x 1 dose, then 1 tab PO QD x 4 days  6 tablet  0  . busPIRone (BUSPAR) 15 MG tablet Take 0.5 tablets (7.5 mg total) by mouth 2 (two) times daily.  30 tablet  12  . calcium-vitamin D (OSCAL WITH D) 500-200 MG-UNIT per tablet Take 1 tablet by mouth daily.      Sarajane Marek Sodium 30-100 MG CAPS Take by mouth at bedtime.        . chlorpheniramine-HYDROcodone (TUSSIONEX PENNKINETIC ER) 10-8 MG/5ML LQCR Take 5 mLs by mouth every 12 (twelve) hours as needed.  120 mL  0  . cholecalciferol (VITAMIN D) 400 UNITS TABS Take by mouth daily.      . cyclobenzaprine (FLEXERIL) 5 MG tablet Take 1/2 to 1 by mouth three times a day as needed       . ferrous sulfate 325 (65 FE) MG tablet Take 325 mg by mouth at bedtime.        . fluticasone (FLONASE) 50 MCG/ACT nasal spray Place 2 sprays into both nostrils daily.  16 g  3  . levocetirizine (XYZAL) 5 MG tablet Take 1 tablet (5 mg total) by mouth every evening.  30 tablet  1  . lisinopril (PRINIVIL,ZESTRIL) 10 MG tablet TAKE 1 TABLET BY MOUTH EVERY NIGHT AT BEDTIME  30 tablet  4  . metFORMIN (GLUCOPHAGE) 1000 MG tablet Take 1 tablet (1,000 mg total) by mouth 2 (two) times daily with a meal.  60 tablet  3  . pravastatin (PRAVACHOL) 80 MG tablet Take 1 tablet (80 mg total) by mouth daily.  90 tablet  3  . pseudoephedrine (SUDAFED 12 HOUR) 120 MG 12 hr tablet Take 1 tablet  (120 mg total) by mouth 2 (two) times daily.  30 tablet  0  . tobramycin (TOBREX) 0.3 % ophthalmic solution Place 1 drop into both eyes every 6 (six) hours.  5 mL  0   No current facility-administered medications on file prior to visit.   Allergies  Allergen Reactions  . Adhesive [Tape] Rash and Other (See Comments)    Peeling of skin  . Prednisone     Hyperglycemia with oral steroids    HPI HPI Comments: Kelly Reed is a 48 y.o. female who presents to the [facility] for a follow-up and sinusitis. On Tuesday, pt states she has chronic sinus pain bilaterally and sinus pressure bilaterally. Pt states she has had associated nausea and vomiting on 7/22. Pt reports her vomit was full of phlegm. Pt reports her nausea and vomiting have since been resolved. Pt states when her sinus pressure is severe it causes a severe headache. Pt had a fever of 102 degrees farhenheit on Wednesday. Pt reports ear popping, ear muffled and ear pain bilaterally. Pt reports taking Zyrtec-D. Pt  states she is doing well of Buspar. Pt reports taking Tussionex for her recent symptoms but denies taking it regularly. Pt denies taking Sudafed.  Pt states she has some back pain from laying down all-day on 01/29/2014. Pt saw her chiropractor to relieve her back pain.    Vitamin D-February 2015, normal at 30.  Review of Systems  Constitutional: Negative for fever and chills.  HENT: Positive for ear pain, hearing loss and sinus pressure (bilaterally).   Respiratory: Negative for shortness of breath.   Gastrointestinal: Positive for nausea and vomiting.  Musculoskeletal: Positive for arthralgias.  Neurological: Negative for weakness.      Triage Vitals: BP 120/74  Pulse 85  Temp(Src) 98.1 F (36.7 C) (Oral)  Resp 16  Ht 5' 10.25" (1.784 m)  Wt 205 lb (92.987 kg)  BMI 29.22 kg/m2  SpO2 98%  LMP 01/07/2014  Objective:   Physical Exam  Nursing note and vitals reviewed. Constitutional: She is  oriented to person, place, and time. She appears well-developed and well-nourished. No distress.  HENT:  Head: Normocephalic and atraumatic.  Right Ear: Tympanic membrane is retracted. Tympanic membrane is not injected and not erythematous. No middle ear effusion.  Left Ear: Tympanic membrane is retracted. Tympanic membrane is not injected and not erythematous.  No middle ear effusion.  Nose: Septal deviation present.  Mouth/Throat: Posterior oropharyngeal erythema present. No oropharyngeal exudate.  Eyes: Conjunctivae and EOM are normal.  Neck: Neck supple. No tracheal deviation present. No thyromegaly present.  Cardiovascular: Normal rate, regular rhythm, S1 normal, S2 normal and normal heart sounds.   No murmur heard. Pulmonary/Chest: Effort normal and breath sounds normal. No respiratory distress.  Musculoskeletal: Normal range of motion.  Lymphadenopathy:    She has no cervical adenopathy.  Neurological: She is alert and oriented to person, place, and time.  Skin: Skin is warm and dry.  Psychiatric: She has a normal mood and affect. Her behavior is normal.      Results for orders placed in visit on 01/30/14  POCT CBC      Result Value Ref Range   WBC 7.3  4.6 - 10.2 K/uL   Lymph, poc 1.7  0.6 - 3.4   POC LYMPH PERCENT 23.2  10 - 50 %L   MID (cbc) 0.2  0 - 0.9   POC MID % 3.0  0 - 12 %M   POC Granulocyte 5.4  2 - 6.9   Granulocyte percent 73.8  37 - 80 %G   RBC 4.44  4.04 - 5.48 M/uL   Hemoglobin 13.5  12.2 - 16.2 g/dL   HCT, POC 40.7  37.7 - 47.9 %   MCV 91.5  80 - 97 fL   MCH, POC 30.3  27 - 31.2 pg   MCHC 33.1  31.8 - 35.4 g/dL   RDW, POC 13.9     Platelet Count, POC 234  142 - 424 K/uL   MPV 8.8  0 - 99.8 fL  POCT GLYCOSYLATED HEMOGLOBIN (HGB A1C)      Result Value Ref Range   Hemoglobin A1C 5.6       Assessment & Plan:  11:02 AM- Patient informed of current plan for treatment and evaluation and agrees with plan at this time.  ALLERGY  DIABETES MELLITUS, TYPE  II - Plan: POCT glycosylated hemoglobin (Hb A1C)  Encounter for long-term (current) use of other medications - Plan: Comprehensive metabolic panel, Ferritin  Essential hypertension, benign  Hypercholesteremia - Plan: Lipid panel  Chronic maxillary  sinusitis - Plan: POCT CBC  Unspecified vitamin D deficiency - Plan: Vit D  25 hydroxy (rtn osteoporosis monitoring)  Meds ordered this encounter  Medications  . lisinopril (PRINIVIL,ZESTRIL) 10 MG tablet    Sig: TAKE 1 TABLET BY MOUTH EVERY NIGHT AT BEDTIME    Dispense:  90 tablet    Refill:  3  . busPIRone (BUSPAR) 15 MG tablet    Sig: Take 0.5 tablets (7.5 mg total) by mouth 2 (two) times daily.    Dispense:  90 tablet    Refill:  3  . levofloxacin (LEVAQUIN) 500 MG tablet    Sig: Take 1 tablet (500 mg total) by mouth daily.    Dispense:  7 tablet    Refill:  0    I personally performed the services described in this documentation, which was scribed in my presence. The recorded information has been reviewed and considered, and addended by me as needed.  [name], MD MPH

## 2014-01-31 LAB — VITAMIN D 25 HYDROXY (VIT D DEFICIENCY, FRACTURES): Vit D, 25-Hydroxy: 57 ng/mL (ref 30–89)

## 2014-02-01 ENCOUNTER — Encounter: Payer: Self-pay | Admitting: Family Medicine

## 2014-02-12 ENCOUNTER — Telehealth: Payer: Self-pay

## 2014-02-12 NOTE — Telephone Encounter (Signed)
Pt called wanting to know what her A1C was at her last OV. LMOM letting her know it was 5.6

## 2014-04-21 ENCOUNTER — Other Ambulatory Visit: Payer: Self-pay | Admitting: Family Medicine

## 2014-06-24 ENCOUNTER — Ambulatory Visit (INDEPENDENT_AMBULATORY_CARE_PROVIDER_SITE_OTHER): Payer: PRIVATE HEALTH INSURANCE | Admitting: Family Medicine

## 2014-06-24 VITALS — BP 140/80 | HR 80 | Temp 97.8°F | Resp 16 | Ht 71.0 in | Wt 202.6 lb

## 2014-06-24 DIAGNOSIS — R05 Cough: Secondary | ICD-10-CM

## 2014-06-24 DIAGNOSIS — R059 Cough, unspecified: Secondary | ICD-10-CM

## 2014-06-24 DIAGNOSIS — R509 Fever, unspecified: Secondary | ICD-10-CM

## 2014-06-24 LAB — POCT CBC
Granulocyte percent: 82.2 %G — AB (ref 37–80)
HCT, POC: 41.6 % (ref 37.7–47.9)
Hemoglobin: 13.9 g/dL (ref 12.2–16.2)
Lymph, poc: 1.3 (ref 0.6–3.4)
MCH, POC: 30.4 pg (ref 27–31.2)
MCHC: 33.5 g/dL (ref 31.8–35.4)
MCV: 91 fL (ref 80–97)
MID (cbc): 0.3 (ref 0–0.9)
MPV: 7.7 fL (ref 0–99.8)
PLATELET COUNT, POC: 240 10*3/uL (ref 142–424)
POC Granulocyte: 7.4 — AB (ref 2–6.9)
POC LYMPH PERCENT: 14.3 %L (ref 10–50)
POC MID %: 3.5 %M (ref 0–12)
RBC: 4.58 M/uL (ref 4.04–5.48)
RDW, POC: 13.9 %
WBC: 9 10*3/uL (ref 4.6–10.2)

## 2014-06-24 LAB — POCT INFLUENZA A/B
Influenza A, POC: NEGATIVE
Influenza B, POC: NEGATIVE

## 2014-06-24 MED ORDER — OSELTAMIVIR PHOSPHATE 75 MG PO CAPS: 75.0000 mg | ORAL_CAPSULE | Freq: Two times a day (BID) | ORAL | Status: DC

## 2014-06-24 MED ORDER — HYDROCODONE-HOMATROPINE 5-1.5 MG/5ML PO SYRP: 5.0000 mL | ORAL_SOLUTION | Freq: Three times a day (TID) | ORAL | Status: DC | PRN

## 2014-06-24 NOTE — Patient Instructions (Signed)
You may have the flu- it is difficult to know for sure at this point.  Go ahead and start the tamiflu and use the cough syrup as needed (remember to not take it with other sedating medications. It will make you feel sleepy!) If you are getting worse or not improving please let me know/ come back in and see [place]

## 2014-06-24 NOTE — Progress Notes (Signed)
[facility] [address], [place] [address] [phone]  Date:  06/24/2014   Name:  Kelly Reed   DOB:  06/17/1966   MRN:  [id number]  PCP:  [name], MD    Chief Complaint: Fever; Chills; Generalized Body Aches; Headache; and Cough   History of Present Illness:  Kelly Reed is a 48 y.o. very pleasant female patient who presents with the following:  Here today with illness. She started to feel "kind of bad" yesterday, then awoke in the night with aches, fever of 101.  She has mild cough, her throat feels iriritated.   She has been working a lot of hours right now due to the holidays- [employer] limited  No GI symptoms.    She did take tylenol last night and today.   She is generally in good health.  Today feels very tired and achy- "I feel like I have the flu."   Lab Results  Component Value Date   HGBA1C 5.6 01/30/2014    She has been watching her glucose and it has been under good control as always   Patient Active Problem List   Diagnosis Date Noted  . Essential hypertension, benign 12/07/2012  . Encounter for long-term (current) use of other medications 12/07/2012  . Ganglion cyst 10/22/2012  . Influenza 07/20/2012  . Routine general medical examination at a health care facility 03/29/2012  . Transaminitis 03/29/2012  . Vertigo 06/09/2011  . Hypercholesteremia 05/04/2011  . Anxiety and depression 02/01/2011  . SYNCOPE 12/27/2009  . EDEMA 03/04/2009  . ALLERGY 11/16/2006  . DIABETES MELLITUS, TYPE II 10/25/2006    Past Medical History  Diagnosis Date  . Diabetes mellitus type II   . NSVD (normal spontaneous vaginal delivery)     x 2  . Hyperlipidemia   . Anxiety     Past Surgical History  Procedure Laterality Date  . Tubal ligation  1993    bilateral  . History of abd. ultrasound  12/04    negative  . 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 Surg. eval lap cholecstectomy  05/28/2006    deferred by patient     History  Substance Use Topics  . Smoking status: Never Smoker   . Smokeless tobacco: Never Used  . Alcohol Use: 0.5 - 1.0 oz/week    1-2 drink(s) per week     Comment: Occasional beer    Family History  Problem Relation Age of Onset  . Cancer Mother     breast    Allergies  Allergen Reactions  . Adhesive [Tape] Rash and Other (See Comments)    Peeling of skin  . Prednisone     Hyperglycemia with oral steroids    Medication list has been reviewed and updated.  Current Outpatient Prescriptions on File Prior to Visit  Medication Sig Dispense Refill  . busPIRone (BUSPAR) 15 MG tablet TAKE 1/2 TABLET BY MOUTH TWICE DAILY 30 tablet 4  . calcium-vitamin D (OSCAL WITH D) 500-200 MG-UNIT per tablet Take 1 tablet by mouth daily.    Kelly Reed Sodium 30-100 MG CAPS Take by mouth at bedtime.      . cholecalciferol (VITAMIN D) 400 UNITS TABS Take by mouth daily.    . cyclobenzaprine (FLEXERIL) 5 MG tablet Take 1/2 to 1 by mouth three times a day as needed     . ferrous sulfate 325 (65 FE) MG tablet Take 325 mg by mouth at bedtime.      Marland Kitchen  fluticasone (FLONASE) 50 MCG/ACT nasal spray Place 2 sprays into both nostrils daily. 16 g 3  . levofloxacin (LEVAQUIN) 500 MG tablet Take 1 tablet (500 mg total) by mouth daily. 7 tablet 0  . lisinopril (PRINIVIL,ZESTRIL) 10 MG tablet TAKE 1 TABLET BY MOUTH EVERY NIGHT AT BEDTIME 90 tablet 3  . metFORMIN (GLUCOPHAGE) 1000 MG tablet Take 1 tablet (1,000 mg total) by mouth 2 (two) times daily with a meal. 60 tablet 3  . pravastatin (PRAVACHOL) 80 MG tablet Take 1 tablet (80 mg total) by mouth daily. 90 tablet 3  . Azelastine HCl 0.15 % SOLN Place 1 spray into the nose 2 (two) times daily. (Patient not taking: Reported on 06/24/2014) 30 mL 1   No current facility-administered medications on file prior to visit.    Review of Systems:  As per HPI- otherwise negative.   Physical Examination: Filed Vitals:   06/24/14 0810  BP:  140/80  Pulse: 80  Temp: 97.8 F (36.6 C)  Resp: 16   Filed Vitals:   06/24/14 0810  Height: 5\' 11"  (1.803 m)  Weight: 202 lb 9.6 oz (91.899 kg)   Body mass index is 28.27 kg/(m^2). Ideal Body Weight: Weight in (lb) to have BMI = 25: 178.9  GEN: WDWN, NAD, Non-toxic, A & O x 3, looks well HEENT: Atraumatic, Normocephalic. Neck supple. No masses, No LAD.  Bilateral TM wnl, oropharynx normal.  PEERL,EOMI.   Ears and Nose: No external deformity. CV: RRR, No M/G/R. No JVD. No thrill. No extra heart sounds. PULM: CTA B, no wheezes, crackles, rhonchi. No retractions. No resp. distress. No accessory muscle use. ABD: S, NT, ND EXTR: No c/c/e NEURO Normal gait.  PSYCH: Normally interactive. Conversant. Not depressed or anxious appearing.  Calm demeanor.   Results for orders placed or performed in visit on 06/24/14  POCT CBC  Result Value Ref Range   WBC 9.0 4.6 - 10.2 K/uL   Lymph, poc 1.3 0.6 - 3.4   POC LYMPH PERCENT 14.3 10 - 50 %L   MID (cbc) 0.3 0 - 0.9   POC MID % 3.5 0 - 12 %M   POC Granulocyte 7.4 (A) 2 - 6.9   Granulocyte percent 82.2 (A) 37 - 80 %G   RBC 4.58 4.04 - 5.48 M/uL   Hemoglobin 13.9 12.2 - 16.2 g/dL   HCT, POC 41.6 37.7 - 47.9 %   MCV 91.0 80 - 97 fL   MCH, POC 30.4 27 - 31.2 pg   MCHC 33.5 31.8 - 35.4 g/dL   RDW, POC 13.9 %   Platelet Count, POC 240 142 - 424 K/uL   MPV 7.7 0 - 99.8 fL  POCT Influenza A/B  Result Value Ref Range   Influenza A, POC Negative    Influenza B, POC Negative     Assessment and Plan: Cough - Plan: POCT CBC, POCT Influenza A/B, oseltamivir (TAMIFLU) 75 MG capsule, HYDROcodone-homatropine (HYCODAN) 5-1.5 MG/5ML syrup  Other specified fever - Plan: POCT CBC, POCT Influenza A/B, oseltamivir (TAMIFLU) 75 MG capsule she would like to start tamiflu today which is fine.  Will also rx hycodan to use as needed. She will let me know if not better soon- Sooner if worse.     Signed Lamar Blinks, MD

## 2014-06-26 ENCOUNTER — Telehealth: Payer: Self-pay

## 2014-06-26 NOTE — Telephone Encounter (Signed)
Pt saw Dr. Lorelei Pont on Wednesday, she states that her aches have gone away, but she still has a low grade fever and a lot of sinus pain and pressure. She has been taking Sudafed, tylenol, tama flue, and rx cough medicine. Would like to know if there is something she can prescribe for her sinus pressure. If so she would like to change her pharmacy to [facility] on [address] [phone]

## 2014-06-26 NOTE — Telephone Encounter (Signed)
I would suggest mucinex and nasal saline in combination with tylenol and motrin and Rx cough medications.  A cool mist humidifer can help also.

## 2014-06-29 ENCOUNTER — Ambulatory Visit (INDEPENDENT_AMBULATORY_CARE_PROVIDER_SITE_OTHER): Payer: PRIVATE HEALTH INSURANCE | Admitting: Family Medicine

## 2014-06-29 VITALS — BP 150/90 | HR 101 | Temp 98.1°F | Resp 16 | Ht 71.0 in | Wt 205.0 lb

## 2014-06-29 DIAGNOSIS — R5081 Fever presenting with conditions classified elsewhere: Secondary | ICD-10-CM

## 2014-06-29 DIAGNOSIS — R059 Cough, unspecified: Secondary | ICD-10-CM

## 2014-06-29 DIAGNOSIS — R0981 Nasal congestion: Secondary | ICD-10-CM

## 2014-06-29 DIAGNOSIS — E1165 Type 2 diabetes mellitus with hyperglycemia: Secondary | ICD-10-CM

## 2014-06-29 DIAGNOSIS — R05 Cough: Secondary | ICD-10-CM

## 2014-06-29 LAB — POCT CBC
Granulocyte percent: 66.9 %G (ref 37–80)
HCT, POC: 36.9 % — AB (ref 37.7–47.9)
Hemoglobin: 12.1 g/dL — AB (ref 12.2–16.2)
Lymph, poc: 1.9 (ref 0.6–3.4)
MCH, POC: 30 pg (ref 27–31.2)
MCHC: 32.6 g/dL (ref 31.8–35.4)
MCV: 92 fL (ref 80–97)
MID (cbc): 0.5 (ref 0–0.9)
MPV: 7.3 fL (ref 0–99.8)
POC Granulocyte: 4.9 (ref 2–6.9)
POC LYMPH PERCENT: 26.6 %L (ref 10–50)
POC MID %: 6.5 %M (ref 0–12)
Platelet Count, POC: 300 10*3/uL (ref 142–424)
RBC: 4.01 M/uL — AB (ref 4.04–5.48)
RDW, POC: 13.5 %
WBC: 7.3 10*3/uL (ref 4.6–10.2)

## 2014-06-29 LAB — GLUCOSE, POCT (MANUAL RESULT ENTRY): POC Glucose: 96 mg/dl (ref 70–99)

## 2014-06-29 MED ORDER — AZITHROMYCIN 250 MG PO TABS: ORAL_TABLET | ORAL | Status: DC

## 2014-06-29 MED ORDER — HYDROCOD POLST-CHLORPHEN POLST 10-8 MG/5ML PO LQCR: 5.0000 mL | Freq: Every day | ORAL | Status: DC

## 2014-06-29 NOTE — Progress Notes (Signed)
48 yo married woman with fever, hoarseness x 1 week.  Throat feels dry.  She was seen last week and started on Tamiflu with a clinical dx of influenza.  She tried to work today but couldn't sustain her energy.  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  Objective:  NAD, hoarse Nose:  Swollen with hyperemia and mucopurulent discharge Oroph:  Red without exudates TM's:  Normal Chest: few exp wheezes both sides Heart:  Reg, no murmur Neck: supple, no adenopathy  Results for orders placed or performed in visit on 06/24/14  POCT CBC  Result Value Ref Range   WBC 9.0 4.6 - 10.2 K/uL   Lymph, poc 1.3 0.6 - 3.4   POC LYMPH PERCENT 14.3 10 - 50 %L   MID (cbc) 0.3 0 - 0.9   POC MID % 3.5 0 - 12 %M   POC Granulocyte 7.4 (A) 2 - 6.9   Granulocyte percent 82.2 (A) 37 - 80 %G   RBC 4.58 4.04 - 5.48 M/uL   Hemoglobin 13.9 12.2 - 16.2 g/dL   HCT, POC 41.6 37.7 - 47.9 %   MCV 91.0 80 - 97 fL   MCH, POC 30.4 27 - 31.2 pg   MCHC 33.5 31.8 - 35.4 g/dL   RDW, POC 13.9 %   Platelet Count, POC 240 142 - 424 K/uL   MPV 7.7 0 - 99.8 fL  POCT Influenza A/B  Result Value Ref Range   Influenza A, POC Negative    Influenza B, POC Negative    Results for orders placed or performed in visit on 06/29/14  POCT CBC  Result Value Ref Range   WBC 7.3 4.6 - 10.2 K/uL   Lymph, poc 1.9 0.6 - 3.4   POC LYMPH PERCENT 26.6 10 - 50 %L   MID (cbc) 0.5 0 - 0.9   POC MID % 6.5 0 - 12 %M   POC Granulocyte 4.9 2 - 6.9   Granulocyte percent 66.9 37 - 80 %G   RBC 4.01 (A) 4.04 - 5.48 M/uL   Hemoglobin 12.1 (A) 12.2 - 16.2 g/dL   HCT, POC 36.9 (A) 37.7 - 47.9 %   MCV 92.0 80 - 97 fL   MCH, POC 30.0 27 - 31.2 pg   MCHC 32.6 31.8 - 35.4 g/dL   RDW, POC 13.5 %   Platelet Count, POC 300 142 - 424 K/uL   MPV 7.3 0 - 99.8 fL  POCT glucose (manual entry)  Result Value Ref Range   POC Glucose 96 70 - 99 mg/dl   Assessment: Persistent symptoms suggestive of either viral or atypical bacteria. At this point patient  needs a different treatment.  This chart was scribed in my presence and reviewed by me personally.    ICD-9-CM ICD-10-CM   1. Fever presenting with conditions classified elsewhere 780.61 R50.81 POCT CBC     azithromycin (ZITHROMAX Z-PAK) 250 MG tablet     chlorpheniramine-HYDROcodone (TUSSIONEX PENNKINETIC ER) 10-8 MG/5ML LQCR  2. Sinus congestion 478.19 R09.81 POCT CBC     azithromycin (ZITHROMAX Z-PAK) 250 MG tablet     chlorpheniramine-HYDROcodone (TUSSIONEX PENNKINETIC ER) 10-8 MG/5ML LQCR  3. Cough 786.2 R05 POCT CBC     azithromycin (ZITHROMAX Z-PAK) 250 MG tablet     chlorpheniramine-HYDROcodone (TUSSIONEX PENNKINETIC ER) 10-8 MG/5ML LQCR  4. Type 2 diabetes mellitus with hyperglycemia 250.00 E11.65 POCT glucose (manual entry)     Signed, Robyn Haber, MD g

## 2014-06-29 NOTE — Telephone Encounter (Signed)
Tried to reach pt- phone is disconnected

## 2014-06-30 NOTE — Telephone Encounter (Signed)
Pt RTC yesterday and saw Dr. Joseph Art. She is still not feeling well but has started the abx.

## 2014-07-02 ENCOUNTER — Ambulatory Visit (INDEPENDENT_AMBULATORY_CARE_PROVIDER_SITE_OTHER): Payer: PRIVATE HEALTH INSURANCE | Admitting: Family Medicine

## 2014-07-02 ENCOUNTER — Ambulatory Visit (INDEPENDENT_AMBULATORY_CARE_PROVIDER_SITE_OTHER): Payer: PRIVATE HEALTH INSURANCE

## 2014-07-02 VITALS — BP 118/77 | HR 97 | Temp 98.2°F | Resp 16 | Ht 71.0 in | Wt 205.0 lb

## 2014-07-02 DIAGNOSIS — J988 Other specified respiratory disorders: Secondary | ICD-10-CM

## 2014-07-02 DIAGNOSIS — J22 Unspecified acute lower respiratory infection: Secondary | ICD-10-CM

## 2014-07-02 DIAGNOSIS — R05 Cough: Secondary | ICD-10-CM

## 2014-07-02 DIAGNOSIS — R059 Cough, unspecified: Secondary | ICD-10-CM

## 2014-07-02 DIAGNOSIS — J322 Chronic ethmoidal sinusitis: Secondary | ICD-10-CM

## 2014-07-02 MED ORDER — ALBUTEROL SULFATE HFA 108 (90 BASE) MCG/ACT IN AERS: 2.0000 | INHALATION_SPRAY | Freq: Four times a day (QID) | RESPIRATORY_TRACT | Status: DC | PRN

## 2014-07-02 NOTE — Progress Notes (Signed)
Chief Complaint:  Chief Complaint  Patient presents with  . Follow-up  . Sinusitis    pt states she is not any better    HPI: Kelly Reed is a 48 y.o. female who is here for her for worseinng sxs since 06/24/14, was treated with tamiflu because she had flu like sxs although neg flu test 12/16 and also z pack on subsequent visit on 12/21 due to lack of improvement Cough is better but feels sinus HA and also has dry cough now, feels  just fatigue, her cbc was WNL , She has had chills and subjective fevers.  Has sinus drainage and now going down her throat. She went back to work and she as standing all day and was very thirsty but her shift supervisor did not give her a break to go to get water. She feels tired.   SpO2 Readings from Last 3 Encounters:  07/02/14 99%  06/29/14 100%  06/24/14 100%     Past Medical History  Diagnosis Date  . Diabetes mellitus type II   . NSVD (normal spontaneous vaginal delivery)     x 2  . Hyperlipidemia   . Anxiety    Past Surgical History  Procedure Laterality Date  . Tubal ligation  1993    bilateral  . History of abd. ultrasound  12/04    negative  . 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 Surg. eval lap cholecstectomy  05/28/2006    deferred by patient   History   Social History  . Marital Status: Married    Spouse Name: N/A    Number of Children: 2  . Years of Education: N/A   Occupational History  . Shipping Dept. [employer]   Social History Main Topics  . Smoking status: Never Smoker   . Smokeless tobacco: Never Used  . Alcohol Use: 0.5 - 1.0 oz/week    1-2 drink(s) per week     Comment: Occasional beer  . Drug Use: No  . Sexual Activity: Yes   Other Topics Concern  . None   Social History Narrative   2 Sons   Works at [employer]   Married   Family History  Problem Relation Age of Onset  . Cancer Mother     breast   Allergies  Allergen Reactions  . Adhesive [Tape] Rash  and Other (See Comments)    Peeling of skin  . Prednisone     Hyperglycemia with oral steroids   Prior to Admission medications   Medication Sig Start Date End Date Taking? Authorizing Provider  Azelastine HCl 0.15 % SOLN Place 1 spray into the nose 2 (two) times daily. 12/25/13  Yes Shawnee Knapp, MD  azithromycin (ZITHROMAX Z-PAK) 250 MG tablet Take as directed on pack 06/29/14  Yes Robyn Haber, MD  busPIRone (BUSPAR) 15 MG tablet TAKE 1/2 TABLET BY MOUTH TWICE DAILY 04/21/14  Yes Mancel Bale, PA-C  calcium-vitamin D (OSCAL WITH D) 500-200 MG-UNIT per tablet Take 1 tablet by mouth daily.   Yes Historical Provider, MD  Casanthranol-Docusate Sodium 30-100 MG CAPS Take by mouth at bedtime.     Yes Historical Provider, MD  chlorpheniramine-HYDROcodone (TUSSIONEX PENNKINETIC ER) 10-8 MG/5ML LQCR Take 5 mLs by mouth at bedtime. 06/29/14  Yes Robyn Haber, MD  cholecalciferol (VITAMIN D) 400 UNITS TABS Take by mouth daily.   Yes Historical Provider, MD  cyclobenzaprine (FLEXERIL) 5 MG tablet Take 1/2 to 1  by mouth three times a day as needed    Yes Historical Provider, MD  ferrous sulfate 325 (65 FE) MG tablet Take 325 mg by mouth at bedtime.     Yes Historical Provider, MD  fluticasone (FLONASE) 50 MCG/ACT nasal spray Place 2 sprays into both nostrils daily. 12/25/13  Yes Shawnee Knapp, MD  HYDROcodone-homatropine [facility]) 5-1.5 MG/5ML syrup Take 5 mLs by mouth every 8 (eight) hours as needed for cough. 06/24/14  Yes Gay Filler Copland, MD  lisinopril (PRINIVIL,ZESTRIL) 10 MG tablet TAKE 1 TABLET BY MOUTH EVERY NIGHT AT BEDTIME 01/30/14  Yes Shawnee Knapp, MD  metFORMIN (GLUCOPHAGE) 1000 MG tablet Take 1 tablet (1,000 mg total) by mouth 2 (two) times daily with a meal. 12/06/12  Yes Shawnee Knapp, MD  oseltamivir (TAMIFLU) 75 MG capsule Take 1 capsule (75 mg total) by mouth 2 (two) times daily. 06/24/14  Yes Gay Filler Copland, MD  pravastatin (PRAVACHOL) 80 MG tablet Take 1 tablet (80 mg total) by mouth  daily. 12/06/12  Yes Shawnee Knapp, MD     ROS: The patient denies night sweats, unintentional weight loss, chest pain, palpitations, wheezing, dyspnea on exertion, nausea, vomiting, abdominal pain, dysuria, hematuria, melena, numbness,  or tingling.   All other systems have been reviewed and were otherwise negative with the exception of those mentioned in the HPI and as above.    PHYSICAL EXAM: Filed Vitals:   07/02/14 1215  BP: 118/77  Pulse: 97  Temp: 98.2 F (36.8 C)  Resp: 16   Filed Vitals:   07/02/14 1215  Height: 5\' 11"  (1.803 m)  Weight: 205 lb (92.987 kg)   Body mass index is 28.6 kg/(m^2).  General: Alert, no acute distress HEENT:  Normocephalic, atraumatic, oropharynx patent. EOMI, PERRLA Erythematous throat, no exudates, TM normal, + sinus tenderness, + erythematous/boggy nasal mucosa Cardiovascular:  Regular rate and rhythm, no rubs murmurs or gallops.  No Carotid bruits, radial pulse intact. No pedal edema.  Respiratory: Clear to auscultation bilaterally.  No wheezes, rales, or rhonchi.  No cyanosis, no use of accessory musculature GI: No organomegaly, abdomen is soft and non-tender, positive bowel sounds.  No masses. Skin: No rashes. Neurologic: Facial musculature symmetric. Psychiatric: Patient is appropriate throughout our interaction. Lymphatic: No cervical lymphadenopathy Musculoskeletal: Gait intact.   LABS: Results for orders placed or performed in visit on 06/29/14  POCT CBC  Result Value Ref Range   WBC 7.3 4.6 - 10.2 K/uL   Lymph, poc 1.9 0.6 - 3.4   POC LYMPH PERCENT 26.6 10 - 50 %L   MID (cbc) 0.5 0 - 0.9   POC MID % 6.5 0 - 12 %M   POC Granulocyte 4.9 2 - 6.9   Granulocyte percent 66.9 37 - 80 %G   RBC 4.01 (A) 4.04 - 5.48 M/uL   Hemoglobin 12.1 (A) 12.2 - 16.2 g/dL   HCT, POC 36.9 (A) 37.7 - 47.9 %   MCV 92.0 80 - 97 fL   MCH, POC 30.0 27 - 31.2 pg   MCHC 32.6 31.8 - 35.4 g/dL   RDW, POC 13.5 %   Platelet Count, POC 300 142 - 424 K/uL    MPV 7.3 0 - 99.8 fL  POCT glucose (manual entry)  Result Value Ref Range   POC Glucose 96 70 - 99 mg/dl     EKG/XRAY:   Primary read interpreted by Dr. Marin Comment at [facility]. Neg for infiltrate or effusion   ASSESSMENT/PLAN: Encounter Diagnoses  Name Primary?  . Lower respiratory infection Yes  . Cough   . Ethmoid sinusitis, unspecified chronicity     Rx albuterol inhaler Try alternatting zyrtec so she is not so dry, humdidifer F/u prn in 2 days by phone  Gross sideeffects, risk and benefits, and alternatives of medications d/w patient. Patient is aware that all medications have potential sideeffects and we are unable to predict every sideeffect or drug-drug interaction that may occur.  LE, [place], DO 07/02/2014 1:34 PM

## 2014-08-07 ENCOUNTER — Ambulatory Visit (INDEPENDENT_AMBULATORY_CARE_PROVIDER_SITE_OTHER): Payer: PRIVATE HEALTH INSURANCE | Admitting: Family Medicine

## 2014-08-07 ENCOUNTER — Encounter: Payer: Self-pay | Admitting: Family Medicine

## 2014-08-07 VITALS — BP 148/74 | HR 78 | Temp 98.7°F | Resp 16 | Ht 70.5 in | Wt 208.8 lb

## 2014-08-07 DIAGNOSIS — J22 Unspecified acute lower respiratory infection: Secondary | ICD-10-CM

## 2014-08-07 DIAGNOSIS — E7800 Pure hypercholesterolemia, unspecified: Secondary | ICD-10-CM

## 2014-08-07 DIAGNOSIS — J322 Chronic ethmoidal sinusitis: Secondary | ICD-10-CM

## 2014-08-07 DIAGNOSIS — Z79899 Other long term (current) drug therapy: Secondary | ICD-10-CM

## 2014-08-07 DIAGNOSIS — J988 Other specified respiratory disorders: Secondary | ICD-10-CM

## 2014-08-07 DIAGNOSIS — R05 Cough: Secondary | ICD-10-CM

## 2014-08-07 DIAGNOSIS — T6591XA Toxic effect of unspecified substance, accidental (unintentional), initial encounter: Secondary | ICD-10-CM | POA: Insufficient documentation

## 2014-08-07 DIAGNOSIS — E559 Vitamin D deficiency, unspecified: Secondary | ICD-10-CM

## 2014-08-07 DIAGNOSIS — R252 Cramp and spasm: Secondary | ICD-10-CM

## 2014-08-07 DIAGNOSIS — I10 Essential (primary) hypertension: Secondary | ICD-10-CM

## 2014-08-07 DIAGNOSIS — J3089 Other allergic rhinitis: Secondary | ICD-10-CM | POA: Insufficient documentation

## 2014-08-07 DIAGNOSIS — E119 Type 2 diabetes mellitus without complications: Secondary | ICD-10-CM

## 2014-08-07 DIAGNOSIS — T6591XS Toxic effect of unspecified substance, accidental (unintentional), sequela: Secondary | ICD-10-CM

## 2014-08-07 DIAGNOSIS — R059 Cough, unspecified: Secondary | ICD-10-CM

## 2014-08-07 DIAGNOSIS — D509 Iron deficiency anemia, unspecified: Secondary | ICD-10-CM

## 2014-08-07 DIAGNOSIS — J309 Allergic rhinitis, unspecified: Secondary | ICD-10-CM | POA: Insufficient documentation

## 2014-08-07 LAB — POCT CBC
Granulocyte percent: 74.8 %G (ref 37–80)
HCT, POC: 39.1 % (ref 37.7–47.9)
HEMOGLOBIN: 13 g/dL (ref 12.2–16.2)
LYMPH, POC: 1.6 (ref 0.6–3.4)
MCH: 30.3 pg (ref 27–31.2)
MCHC: 33.2 g/dL (ref 31.8–35.4)
MCV: 91.2 fL (ref 80–97)
MID (CBC): 0.1 (ref 0–0.9)
MPV: 8.4 fL (ref 0–99.8)
POC Granulocyte: 5 (ref 2–6.9)
POC LYMPH PERCENT: 23.2 %L (ref 10–50)
POC MID %: 2 %M (ref 0–12)
Platelet Count, POC: 252 10*3/uL (ref 142–424)
RBC: 4.26 M/uL (ref 4.04–5.48)
RDW, POC: 13.7 %
WBC: 6.7 10*3/uL (ref 4.6–10.2)

## 2014-08-07 LAB — COMPREHENSIVE METABOLIC PANEL
ALK PHOS: 44 U/L (ref 39–117)
ALT: 16 U/L (ref 0–35)
AST: 14 U/L (ref 0–37)
Albumin: 4.4 g/dL (ref 3.5–5.2)
BUN: 16 mg/dL (ref 6–23)
CO2: 24 mEq/L (ref 19–32)
Calcium: 9.4 mg/dL (ref 8.4–10.5)
Chloride: 105 mEq/L (ref 96–112)
Creat: 0.65 mg/dL (ref 0.50–1.10)
Glucose, Bld: 106 mg/dL — ABNORMAL HIGH (ref 70–99)
Potassium: 4.3 mEq/L (ref 3.5–5.3)
Sodium: 137 mEq/L (ref 135–145)
Total Bilirubin: 0.4 mg/dL (ref 0.2–1.2)
Total Protein: 6.7 g/dL (ref 6.0–8.3)

## 2014-08-07 LAB — LIPID PANEL
Cholesterol: 150 mg/dL (ref 0–200)
HDL: 62 mg/dL (ref >39)
LDL Cholesterol: 71 mg/dL (ref 0–99)
TRIGLYCERIDES: 87 mg/dL (ref <150)
Total CHOL/HDL Ratio: 2.4 Ratio
VLDL: 17 mg/dL (ref 0–40)

## 2014-08-07 LAB — C-REACTIVE PROTEIN: CRP: <0.5 mg/dL (ref <0.60)

## 2014-08-07 LAB — CK: Total CK: 107 U/L (ref 7–177)

## 2014-08-07 LAB — POCT GLYCOSYLATED HEMOGLOBIN (HGB A1C): Hemoglobin A1C: 5.6

## 2014-08-07 LAB — FERRITIN: FERRITIN: 9 ng/mL — AB (ref 10–291)

## 2014-08-07 LAB — TSH: TSH: 0.697 u[IU]/mL (ref 0.350–4.500)

## 2014-08-07 LAB — MAGNESIUM: Magnesium: 1.8 mg/dL (ref 1.5–2.5)

## 2014-08-07 LAB — SEDIMENTATION RATE: Sed Rate: 5 mm/hr (ref 0–22)

## 2014-08-07 MED ORDER — FLUTICASONE PROPIONATE 50 MCG/ACT NA SUSP: 2.0000 | Freq: Every day | NASAL | Status: DC

## 2014-08-07 MED ORDER — ALBUTEROL SULFATE HFA 108 (90 BASE) MCG/ACT IN AERS: 2.0000 | INHALATION_SPRAY | Freq: Four times a day (QID) | RESPIRATORY_TRACT | Status: DC | PRN

## 2014-08-07 MED ORDER — AZELASTINE HCL 0.15 % NA SOLN: 1.0000 | Freq: Two times a day (BID) | NASAL | Status: DC

## 2014-08-07 MED ORDER — ASPIRIN EC 81 MG PO TBEC: 81.0000 mg | DELAYED_RELEASE_TABLET | Freq: Every day | ORAL | Status: DC

## 2014-08-07 MED ORDER — BUSPIRONE HCL 15 MG PO TABS: 7.5000 mg | ORAL_TABLET | Freq: Two times a day (BID) | ORAL | Status: DC

## 2014-08-07 NOTE — Patient Instructions (Addendum)
Ok to stop your iron supplement for now.  If you have a heavy period, then just restart for 1 week after. Start a 400mg  magnesium supplement before bed each night - make sure you are drinking plenty of water.   If you continue to have leg cramps at night, then stop your pravastatin for 2 weeks to see if your leg cramps go away.   Start taking a baby aspirine 81mg  daily.  Muscle Cramps and Spasms Muscle cramps and spasms occur when a muscle or muscles tighten and you have no control over this tightening (involuntary muscle contraction). They are a common problem and can develop in any muscle. The most common place is in the calf muscles of the leg. Both muscle cramps and muscle spasms are involuntary muscle contractions, but they also have differences:   Muscle cramps are sporadic and painful. They may last a few seconds to a quarter of an hour. Muscle cramps are often more forceful and last longer than muscle spasms.  Muscle spasms may or may not be painful. They may also last just a few seconds or much longer. CAUSES  It is uncommon for cramps or spasms to be due to a serious underlying problem. In many cases, the cause of cramps or spasms is unknown. Some common causes are:   Overexertion.   Overuse from repetitive motions (doing the same thing over and over).   Remaining in a certain position for a long period of time.   Improper preparation, form, or technique while performing a sport or activity.   Dehydration.   Injury.   Side effects of some medicines.   Abnormally low levels of the salts and ions in your blood (electrolytes), especially potassium and calcium. This could happen if you are taking water pills (diuretics) or you are pregnant.  Some underlying medical problems can make it more likely to develop cramps or spasms. These include, but are not limited to:   Diabetes.   Parkinson disease.   Hormone disorders, such as thyroid problems.   Alcohol abuse.    Diseases specific to muscles, joints, and bones.   Blood vessel disease where not enough blood is getting to the muscles.  HOME CARE INSTRUCTIONS   Stay well hydrated. Drink enough water and fluids to keep your urine clear or pale yellow.  It may be helpful to massage, stretch, and relax the affected muscle.  For tight or tense muscles, use a warm towel, heating pad, or hot shower water directed to the affected area.  If you are sore or have pain after a cramp or spasm, applying ice to the affected area may relieve discomfort.  Put ice in a plastic bag.  Place a towel between your skin and the bag.  Leave the ice on for 15-20 minutes, 03-04 times a day.  Medicines used to treat a known cause of cramps or spasms may help reduce their frequency or severity. Only take over-the-counter or prescription medicines as directed by your caregiver. SEEK MEDICAL CARE IF:  Your cramps or spasms get more severe, more frequent, or do not improve over time.  MAKE SURE YOU:   Understand these instructions.  Will watch your condition.  Will get help right away if you are not doing well or get worse. Document Released: 12/16/2001 Document Revised: 10/21/2012 Document Reviewed: 06/12/2012 [employer] Patient Information 2015 [place], [place]. This information is not intended to replace advice given to you by your health care provider. Make sure you discuss any questions you  have with your health care provider. Bronchospasm A bronchospasm is a spasm or tightening of the airways going into the lungs. During a bronchospasm breathing becomes more difficult because the airways get smaller. When this happens there can be coughing, a whistling sound when breathing (wheezing), and difficulty breathing. Bronchospasm is often associated with asthma, but not all patients who experience a bronchospasm have asthma. CAUSES  A bronchospasm is caused by inflammation or irritation of the airways. The inflammation or  irritation may be triggered by:   Allergies (such as to animals, pollen, food, or mold). Allergens that cause bronchospasm may cause wheezing immediately after exposure or many hours later.   Infection. Viral infections are believed to be the most common cause of bronchospasm.   Exercise.   Irritants (such as pollution, cigarette smoke, strong odors, aerosol sprays, and paint fumes).   Weather changes. Winds increase molds and pollens in the air. Rain refreshes the air by washing irritants out. Cold air may cause inflammation.   Stress and emotional upset.  SIGNS AND SYMPTOMS   Wheezing.   Excessive nighttime coughing.   Frequent or severe coughing with a simple cold.   Chest tightness.   Shortness of breath.  DIAGNOSIS  Bronchospasm is usually diagnosed through a history and physical exam. Tests, such as chest X-rays, are sometimes done to look for other conditions. TREATMENT   Inhaled medicines can be given to open up your airways and help you breathe. The medicines can be given using either an inhaler or a nebulizer machine.  Corticosteroid medicines may be given for severe bronchospasm, usually when it is associated with asthma. HOME CARE INSTRUCTIONS   Always have a plan prepared for seeking medical care. Know when to call your health care provider and local emergency services (911 in the [place] Know where you can access local emergency care.  Only take medicines as directed by your health care provider.  If you were prescribed an inhaler or nebulizer machine, ask your health care provider to explain how to use it correctly. Always use a spacer with your inhaler if you were given one.  It is necessary to remain calm during an attack. Try to relax and breathe more slowly.  Control your home environment in the following ways:   Change your heating and air conditioning filter at least once a month.   Limit your use of fireplaces and wood stoves.  Do not  smoke and do not allow smoking in your home.   Avoid exposure to perfumes and fragrances.   Get rid of pests (such as roaches and mice) and their droppings.   Throw away plants if you see mold on them.   Keep your house clean and dust free.   Replace carpet with wood, tile, or vinyl flooring. Carpet can trap dander and dust.   Use allergy-proof pillows, mattress covers, and box spring covers.   Wash bed sheets and blankets every week in hot water and dry them in a dryer.   Use blankets that are made of polyester or cotton.   Wash hands frequently. SEEK MEDICAL CARE IF:   You have muscle aches.   You have chest pain.   The sputum changes from clear or white to yellow, green, gray, or bloody.   The sputum you cough up gets thicker.   There are problems that may be related to the medicine you are given, such as a rash, itching, swelling, or trouble breathing.  SEEK IMMEDIATE MEDICAL CARE IF:  You have worsening wheezing and coughing even after taking your prescribed medicines.   You have increased difficulty breathing.   You develop severe chest pain. MAKE SURE YOU:   Understand these instructions.  Will watch your condition.  Will get help right away if you are not doing well or get worse. Document Released: 06/29/2003 Document Revised: 07/01/2013 Document Reviewed: 12/16/2012 [facility] Patient Information 2015 [place], [place]. This information is not intended to replace advice given to you by your health care provider. Make sure you discuss any questions you have with your health care provider.

## 2014-08-07 NOTE — Progress Notes (Signed)
Subjective:  This chart was scribed for [name], MD by Donato Schultz, Medical Scribe. This patient was seen in Room 25 and the patient's care was started at 8:32 AM.   Patient ID: Kelly Reed, female    DOB: [birth year], 49 y.o.   MRN: [id number]  HPI   Chief Complaint  Patient presents with  . Follow-up    DIABETES and LABWORK    HPI Comments: Kelly Reed is a 49 y.o. female who presents to the [facility] for a 6 month DM follow-up.  She has a history of Vitamin D deficiency but her levels have been normal for the past 5 years.  Microalbumin was done a year ago and was within normal range.  We have no checked a magnesium level prior to today.  Inflammatory markers have not been elevated - last checked 2 years ago.  She has been complaining of recurrent, intermittent productive cough, wheezing, and eye itching that started in the winter of last year.  Her allergies are aggravated at work due to dust that she encounters at work.  She wears a mask at work and will use her inhaler when necessary.  She has used the inhaler twice this week.  She has also been using natural oils with temporary relief to her symptoms.  She saw an allergist from 2012-2013 and was on allergy shots.  She stopped the allergy shots because they were not treating her recurrent sinusitis.    She does not check her blood pressure outside of the office regularly.  When she is not using her inhaler her blood pressure is normal.  She denies experiencing complications while using her DM medication.    She is complaining of a cramping pain in her inner thighs bilaterally that is worse at night.  She does not take a magnesium supplement.  She states that the muscle relaxers are not strong enough to alleviate her pain and she is scared that the medication will cause her to be fatigued.  She uses a home remedy with temporary relief to her symptoms.  She lists constipation as an associated symptom.  She has  been taking stool softener with no relief to her constipation.    She walks regularly for exercise.  She doe snot take a baby aspirin daily.  She saw an ophthalmologist last August.   Past Medical History  Diagnosis Date  . Diabetes mellitus type II   . NSVD (normal spontaneous vaginal delivery)     x 2  . Hyperlipidemia   . Anxiety    Past Surgical History  Procedure Laterality Date  . Tubal ligation  1993    bilateral  . History of abd. ultrasound  12/04    negative  . 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 Surg. eval lap cholecstectomy  05/28/2006    deferred by patient   Family History  Problem Relation Age of Onset  . Cancer Mother     breast   History   Social History  . Marital Status: Married    Spouse Name: N/A    Number of Children: 2  . Years of Education: N/A   Occupational History  . Shipping Dept. [employer]   Social History Main Topics  . Smoking status: Never Smoker   . Smokeless tobacco: Never Used  . Alcohol Use: 0.5 - 1.0 oz/week    1-2 drink(s) per week     Comment: Occasional beer  .  Drug Use: No  . Sexual Activity: Yes   Other Topics Concern  . Not on file   Social History Narrative   2 Sons   Works at [employer]   Married   Allergies  Allergen Reactions  . Adhesive [Tape] Rash and Other (See Comments)    Peeling of skin  . Prednisone     Hyperglycemia with oral steroids    Review of Systems  Respiratory: Positive for cough, shortness of breath and wheezing.   Gastrointestinal: Positive for constipation.  Musculoskeletal: Positive for arthralgias.    Objective:  BP 148/74 mmHg  Pulse 78  Temp(Src) 98.7 F (37.1 C) (Oral)  Resp 16  Ht 5' 10.5" (1.791 m)  Wt 208 lb 12.8 oz (94.711 kg)  BMI 29.53 kg/m2  SpO2 98%  LMP 07/14/2014  Physical Exam  Constitutional: She is oriented to person, place, and time. She appears well-developed and well-nourished.  HENT:  Head: Normocephalic and  atraumatic.  Right Ear: Hearing, tympanic membrane, external ear and ear canal normal.  Left Ear: Hearing, tympanic membrane, external ear and ear canal normal.  Nose: Mucosal edema, rhinorrhea and septal deviation present.  Mouth/Throat: Oropharynx is clear and moist. No oropharyngeal exudate.  Septal deviation to the left with large amount of nasal mucosa, edema, and rhinorrhea.  Eyes: EOM are normal.  Neck: Normal range of motion. Neck supple. No thyromegaly present.  Cardiovascular: Normal rate, regular rhythm and normal heart sounds.  Exam reveals no gallop and no friction rub.   No murmur heard. Pulmonary/Chest: Effort normal and breath sounds normal. No respiratory distress. She has no wheezes. She has no rales.  Lungs clear to auscultation bilaterally with good air movement.  Musculoskeletal: Normal range of motion.  Lymphadenopathy:    She has no cervical adenopathy.  Neurological: She is alert and oriented to person, place, and time.  Skin: Skin is warm and dry.  Psychiatric: She has a normal mood and affect. Her behavior is normal.  Nursing note and vitals reviewed.   Assessment & Plan:    Diabetes mellitus without complication - Plan: [facility] Diabetes Foot Exam, POCT glycosylated hemoglobin (Hb A1C), Microalbumin/Creatinine Ratio, Urine, TSH - start asa 81mg    Hypercholesteremia - Plan: Lipid panel, Comprehensive metabolic panel - rec d/c pravastatin x 2 wks to see if myalgias resolve - if they do cons trial of other statin.  Essential hypertension, benign - Plan: Comprehensive metabolic panel  - elev BP likely 2/2 illness and congestion due to foreign substance allergy.  She will check blood pressure at work and call if >140/90 regularly.   Polypharmacy - Plan: Comprehensive metabolic panel  Vitamin D deficiency  Anemia, iron deficiency - Plan: POCT CBC, Ferritin - resolved -  D/c qd ferrous sulfate but try taking supp only while on menses.  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  Patient has Cyclobenzaprine at home but it makes her too sedated.   Lower respiratory infection - Plan: albuterol (PROVENTIL HFA;VENTOLIN HFA) 108 (90 BASE) MCG/ACT inhaler  Cough - Plan: albuterol (PROVENTIL HFA;VENTOLIN HFA) 108 (90 BASE) MCG/ACT inhaler - We do not have prior notes from allergist.  The patient has not seen pulmonologist in the past either.  She had a chest x-ray a month ago that was normal.  Ethmoid sinusitis, unspecified chronicity - Plan: albuterol (PROVENTIL HFA;VENTOLIN HFA) 108 (90 BASE) MCG/ACT inhaler  Meds ordered this encounter  Medications  . fluticasone (FLONASE) 50 MCG/ACT nasal spray    Sig: Place 2 sprays into both nostrils daily.    Dispense:  16 g    Refill:  11  . busPIRone (BUSPAR) 15 MG tablet    Sig: Take 0.5 tablets (7.5 mg total) by mouth 2 (two) times daily.    Dispense:  90 tablet    Refill:  4  . Azelastine HCl 0.15 % SOLN    Sig: Place 1 spray into the nose 2 (two) times daily.    Dispense:  30 mL    Refill:  11  . albuterol (PROVENTIL HFA;VENTOLIN HFA) 108 (90 BASE) MCG/ACT inhaler    Sig: Inhale 2 puffs into the lungs every 6 (six) hours as needed for wheezing or shortness of breath.    Dispense:  1 Inhaler    Refill:  2    Ok to substitute with ANY albuterol inhaler - if you need me to change the prescription for patient cost-savings, please let me know.  Marland Kitchen aspirin EC 81 MG tablet    Sig: Take 1 tablet (81 mg total) by mouth daily.    I personally performed the services described in this documentation, which was scribed in my presence. The recorded information has been reviewed and considered, and addended by me as needed.  [name], MD MPH

## 2014-08-08 LAB — MICROALBUMIN / CREATININE URINE RATIO
CREATININE, URINE: 27.1 mg/dL
MICROALB UR: 0.6 mg/dL (ref <2.0)
Microalb Creat Ratio: 22.1 mg/g (ref 0.0–30.0)

## 2014-09-23 ENCOUNTER — Encounter: Payer: Self-pay | Admitting: Family Medicine

## 2014-10-23 ENCOUNTER — Telehealth: Payer: Self-pay

## 2014-10-23 NOTE — Telephone Encounter (Signed)
Dr Shaw

## 2014-10-23 NOTE — Telephone Encounter (Signed)
Dr. Brigitte Pulse told pt to call if she started experiencing cramps again since this may be related to the PRAVASTATIN she's been taking for cholesterol. Pt stopped taking this rx 7 days ago once she started experiencing the cramps. She wants to know what Dr. Brigitte Pulse suggests at this point. Pt is also requesting a refill of METFORMIN. Please fax refill to [phone] is the pharmacy at her work. If this cannot be faxed, please inform pt and she will come in to pick it up.

## 2014-10-27 NOTE — Telephone Encounter (Signed)
Please fax over a prescription for metformin 1000mg  bid disp 180 ref 3  Now that pt has been off the pravastatin for >10d - is she still having cramps?  Have they resolved?  If pt is not sure if they improved, then I would recommend restarting 1/2 tab of pravastatin daily along with coenzyme q10 to see if she tolerates that.  If the muscle cramps completely disappeared after stopping the pravastatin, then would try to switch her to atorvastatin 10 (disp 90, ref1) with coenzyme q110 supp

## 2014-10-28 MED ORDER — ATORVASTATIN CALCIUM 10 MG PO TABS: 10.0000 mg | ORAL_TABLET | Freq: Every day | ORAL | Status: DC

## 2014-10-28 MED ORDER — METFORMIN HCL 1000 MG PO TABS: 1000.0000 mg | ORAL_TABLET | Freq: Two times a day (BID) | ORAL | Status: DC

## 2014-10-28 NOTE — Telephone Encounter (Signed)
Spoke with pt, she states the cramps stopped when she stopped the medication so I sent in the generic Lipitor to her pharmacy.

## 2014-10-28 NOTE — Telephone Encounter (Signed)
Rx faxed

## 2014-10-28 NOTE — Telephone Encounter (Signed)
Rx printed, I need a signature to fax. Left message for pt to call back to let me know about the cholesterol medication.

## 2014-10-28 NOTE — Addendum Note (Signed)
Addended by: Jannette Spanner on: 10/28/2014 04:54 PM   Modules accepted: Orders

## 2014-12-29 ENCOUNTER — Ambulatory Visit (INDEPENDENT_AMBULATORY_CARE_PROVIDER_SITE_OTHER): Payer: PRIVATE HEALTH INSURANCE | Admitting: Physician Assistant

## 2014-12-29 VITALS — BP 122/76 | HR 86 | Temp 97.7°F | Resp 18 | Ht 70.0 in | Wt 216.6 lb

## 2014-12-29 DIAGNOSIS — J069 Acute upper respiratory infection, unspecified: Secondary | ICD-10-CM | POA: Diagnosis not present

## 2014-12-29 MED ORDER — BENZONATATE 100 MG PO CAPS: 100.0000 mg | ORAL_CAPSULE | Freq: Three times a day (TID) | ORAL | Status: DC | PRN

## 2014-12-29 NOTE — Patient Instructions (Signed)
Upper Respiratory Infection, Adult An upper respiratory infection (URI) is also sometimes known as the common cold. The upper respiratory tract includes the nose, sinuses, throat, trachea, and bronchi. Bronchi are the airways leading to the lungs. Most people improve within 1 week, but symptoms can last up to 2 weeks. A residual cough may last even longer.  CAUSES Many different viruses can infect the tissues lining the upper respiratory tract. The tissues become irritated and inflamed and often become very moist. Mucus production is also common. A cold is contagious. You can easily spread the virus to others by oral contact. This includes kissing, sharing a glass, coughing, or sneezing. Touching your mouth or nose and then touching a surface, which is then touched by another person, can also spread the virus. SYMPTOMS  Symptoms typically develop 1 to 3 days after you come in contact with a cold virus. Symptoms vary from person to person. They may include:  Runny nose.  Sneezing.  Nasal congestion.  Sinus irritation.  Sore throat.  Loss of voice (laryngitis).  Cough.  Fatigue.  Muscle aches.  Loss of appetite.  Headache.  Low-grade fever. DIAGNOSIS  You might diagnose your own cold based on familiar symptoms, since most people get a cold 2 to 3 times a year. Your caregiver can confirm this based on your exam. Most importantly, your caregiver can check that your symptoms are not due to another disease such as strep throat, sinusitis, pneumonia, asthma, or epiglottitis. Blood tests, throat tests, and X-rays are not necessary to diagnose a common cold, but they may sometimes be helpful in excluding other more serious diseases. Your caregiver will decide if any further tests are required. RISKS AND COMPLICATIONS  You may be at risk for a more severe case of the common cold if you smoke cigarettes, have chronic heart disease (such as heart failure) or lung disease (such as asthma), or if  you have a weakened immune system. The very young and very old are also at risk for more serious infections. Bacterial sinusitis, middle ear infections, and bacterial pneumonia can complicate the common cold. The common cold can worsen asthma and chronic obstructive pulmonary disease (COPD). Sometimes, these complications can require emergency medical care and may be life-threatening. PREVENTION  The best way to protect against getting a cold is to practice good hygiene. Avoid oral or hand contact with people with cold symptoms. Wash your hands often if contact occurs. There is no clear evidence that vitamin C, vitamin E, echinacea, or exercise reduces the chance of developing a cold. However, it is always recommended to get plenty of rest and practice good nutrition. TREATMENT  Treatment is directed at relieving symptoms. There is no cure. Antibiotics are not effective, because the infection is caused by a virus, not by bacteria. Treatment may include:  Increased fluid intake. Sports drinks offer valuable electrolytes, sugars, and fluids.  Breathing heated mist or steam (vaporizer or shower).  Eating chicken soup or other clear broths, and maintaining good nutrition.  Getting plenty of rest.  Using gargles or lozenges for comfort.  Controlling fevers with ibuprofen or acetaminophen as directed by your caregiver.  Increasing usage of your inhaler if you have asthma. Zinc gel and zinc lozenges, taken in the first 24 hours of the common cold, can shorten the duration and lessen the severity of symptoms. Pain medicines may help with fever, muscle aches, and throat pain. A variety of non-prescription medicines are available to treat congestion and runny nose. Your caregiver   can make recommendations and may suggest nasal or lung inhalers for other symptoms.  HOME CARE INSTRUCTIONS   Only take over-the-counter or prescription medicines for pain, discomfort, or fever as directed by your  caregiver.  Use a warm mist humidifier or inhale steam from a shower to increase air moisture. This may keep secretions moist and make it easier to breathe.  Drink enough water and fluids to keep your urine clear or pale yellow.  Rest as needed.  Return to work when your temperature has returned to normal or as your caregiver advises. You may need to stay home longer to avoid infecting others. You can also use a face mask and careful hand washing to prevent spread of the virus. SEEK MEDICAL CARE IF:   After the first few days, you feel you are getting worse rather than better.  You need your caregiver's advice about medicines to control symptoms.  You develop chills, worsening shortness of breath, or brown or red sputum. These may be signs of pneumonia.  You develop yellow or brown nasal discharge or pain in the face, especially when you bend forward. These may be signs of sinusitis.  You develop a fever, swollen neck glands, pain with swallowing, or white areas in the back of your throat. These may be signs of strep throat. SEEK IMMEDIATE MEDICAL CARE IF:   You have a fever.  You develop severe or persistent headache, ear pain, sinus pain, or chest pain.  You develop wheezing, a prolonged cough, cough up blood, or have a change in your usual mucus (if you have chronic lung disease).  You develop sore muscles or a stiff neck. Document Released: 12/20/2000 Document Revised: 09/18/2011 Document Reviewed: 10/01/2013 [employer] Patient Information 2015 [employer]. This information is not intended to replace advice given to you by your health care provider. Make sure you discuss any questions you have with your health care provider.  

## 2014-12-29 NOTE — Progress Notes (Signed)
   Subjective:    Patient ID: Kelly Reed, female    DOB: 11-Jan-1966, 49 y.o.   MRN: [id number]  HPI Patient presents for sinus pressure and congestion for past 3 days and is accompanied by cough, rhinorrhea, and sore throat. Had fever for 2 days that broke yesterday. Endorses isolated episode of diarrhea. Denies SOB, CP, wheezing, N/V, or HA. Felt too bad to go to work. No known sick contacts. Has tried mucinex with moderate relief. Med allergy to prednisone.   Review of Systems  Constitutional: Positive for fever (resolved) and fatigue. Negative for chills and appetite change.  HENT: Positive for congestion, rhinorrhea, sinus pressure and sore throat. Negative for ear discharge, ear pain, postnasal drip and sneezing.   Eyes: Negative.   Respiratory: Positive for cough. Negative for chest tightness, shortness of breath and wheezing.   Cardiovascular: Negative for chest pain.  Gastrointestinal: Positive for diarrhea. Negative for nausea, vomiting, abdominal pain and constipation.  Musculoskeletal: Negative for neck pain and neck stiffness.  Neurological: Negative for dizziness and headaches.       Objective:   Physical Exam  Constitutional: She is oriented to person, place, and time. She appears well-developed and well-nourished. No distress.  Blood pressure 122/76, pulse 86, temperature 97.7 F (36.5 C), temperature source Oral, resp. rate 18, height 5\' 10"  (1.778 m), weight 216 lb 9.6 oz (98.249 kg), last menstrual period 11/17/2014, SpO2 97 %.   HENT:  Head: Normocephalic and atraumatic.  Right Ear: External ear and ear canal normal. No drainage, swelling or tenderness. A middle ear effusion (serous fluid) is present.  Left Ear: Tympanic membrane, external ear and ear canal normal. No drainage, swelling or tenderness.  No middle ear effusion.  Nose: Rhinorrhea (with erythema) present. No mucosal edema or sinus tenderness. Right sinus exhibits no maxillary sinus tenderness and no  frontal sinus tenderness. Left sinus exhibits no maxillary sinus tenderness and no frontal sinus tenderness.  Mouth/Throat: Uvula is midline, oropharynx is clear and moist and mucous membranes are normal. No oropharyngeal exudate, posterior oropharyngeal edema or posterior oropharyngeal erythema.  Eyes: Conjunctivae are normal. Pupils are equal, round, and reactive to light. Right eye exhibits no discharge. Left eye exhibits no discharge. No scleral icterus.  Neck: Normal range of motion. Neck supple. No thyromegaly present.  Cardiovascular: Normal rate, regular rhythm and normal heart sounds.  Exam reveals no gallop and no friction rub.   No murmur heard. Pulmonary/Chest: Effort normal and breath sounds normal. No respiratory distress. She has no decreased breath sounds. She has no wheezes. She has no rhonchi. She has no rales.  Abdominal: Soft. Bowel sounds are normal. She exhibits no distension. There is no tenderness. There is no rebound and no guarding.  Lymphadenopathy:    She has no cervical adenopathy.  Neurological: She is alert and oriented to person, place, and time.  Skin: Skin is warm and dry. No rash noted. She is not diaphoretic. No erythema.       Assessment & Plan:  1. Acute upper respiratory infection Continue Mucinex with plenty of water and nasal spray. - benzonatate (TESSALON) 100 MG capsule; Take 1-2 capsules (100-200 mg total) by mouth 3 (three) times daily as needed for cough.  Dispense: 40 capsule; Refill: 0   Rayon Mcchristian PA-C  [facility] 12/29/2014 11:07 AM

## 2015-02-12 ENCOUNTER — Ambulatory Visit: Payer: PRIVATE HEALTH INSURANCE | Admitting: Family Medicine

## 2015-02-13 ENCOUNTER — Ambulatory Visit (INDEPENDENT_AMBULATORY_CARE_PROVIDER_SITE_OTHER): Payer: PRIVATE HEALTH INSURANCE | Admitting: Family Medicine

## 2015-02-13 VITALS — BP 130/82 | HR 95 | Temp 98.0°F | Resp 16 | Ht 70.5 in | Wt 219.0 lb

## 2015-02-13 DIAGNOSIS — E119 Type 2 diabetes mellitus without complications: Secondary | ICD-10-CM | POA: Diagnosis not present

## 2015-02-13 DIAGNOSIS — I10 Essential (primary) hypertension: Secondary | ICD-10-CM | POA: Diagnosis not present

## 2015-02-13 DIAGNOSIS — F418 Other specified anxiety disorders: Secondary | ICD-10-CM | POA: Diagnosis not present

## 2015-02-13 DIAGNOSIS — Z79899 Other long term (current) drug therapy: Secondary | ICD-10-CM | POA: Diagnosis not present

## 2015-02-13 DIAGNOSIS — E7800 Pure hypercholesterolemia, unspecified: Secondary | ICD-10-CM

## 2015-02-13 DIAGNOSIS — F419 Anxiety disorder, unspecified: Secondary | ICD-10-CM

## 2015-02-13 DIAGNOSIS — G4700 Insomnia, unspecified: Secondary | ICD-10-CM

## 2015-02-13 DIAGNOSIS — F329 Major depressive disorder, single episode, unspecified: Secondary | ICD-10-CM

## 2015-02-13 DIAGNOSIS — D509 Iron deficiency anemia, unspecified: Secondary | ICD-10-CM

## 2015-02-13 DIAGNOSIS — F32A Depression, unspecified: Secondary | ICD-10-CM

## 2015-02-13 LAB — COMPREHENSIVE METABOLIC PANEL
ALT: 30 U/L — AB (ref 6–29)
AST: 23 U/L (ref 10–35)
Albumin: 4.5 g/dL (ref 3.6–5.1)
Alkaline Phosphatase: 54 U/L (ref 33–130)
BILIRUBIN TOTAL: 0.5 mg/dL (ref 0.2–1.2)
BUN: 11 mg/dL (ref 7–25)
CO2: 26 mmol/L (ref 20–31)
Calcium: 9.3 mg/dL (ref 8.6–10.4)
Chloride: 103 mmol/L (ref 98–110)
Creat: 0.69 mg/dL (ref 0.50–1.05)
Glucose, Bld: 122 mg/dL — ABNORMAL HIGH (ref 65–99)
POTASSIUM: 4.2 mmol/L (ref 3.5–5.3)
Sodium: 138 mmol/L (ref 135–146)
Total Protein: 7 g/dL (ref 6.1–8.1)

## 2015-02-13 LAB — LIPID PANEL
Cholesterol: 153 mg/dL (ref 125–200)
HDL: 66 mg/dL (ref >=46)
LDL Cholesterol: 66 mg/dL (ref <130)
TRIGLYCERIDES: 105 mg/dL (ref <150)
Total CHOL/HDL Ratio: 2.3 Ratio (ref <=5.0)
VLDL: 21 mg/dL (ref <30)

## 2015-02-13 LAB — CBC
HEMATOCRIT: 40.7 % (ref 36.0–46.0)
Hemoglobin: 13.4 g/dL (ref 12.0–15.0)
MCH: 27.9 pg (ref 26.0–34.0)
MCHC: 32.9 g/dL (ref 30.0–36.0)
MCV: 84.8 fL (ref 78.0–100.0)
MPV: 10.5 fL (ref 8.6–12.4)
Platelets: 258 10*3/uL (ref 150–400)
RBC: 4.8 MIL/uL (ref 3.87–5.11)
RDW: 16.4 % — ABNORMAL HIGH (ref 11.5–15.5)
WBC: 5.6 10*3/uL (ref 4.0–10.5)

## 2015-02-13 LAB — FERRITIN: FERRITIN: 12 ng/mL (ref 10–291)

## 2015-02-13 LAB — POCT GLYCOSYLATED HEMOGLOBIN (HGB A1C): HEMOGLOBIN A1C: 6.6

## 2015-02-13 MED ORDER — TRAZODONE HCL 50 MG PO TABS: 25.0000 mg | ORAL_TABLET | Freq: Every evening | ORAL | Status: DC | PRN

## 2015-02-13 MED ORDER — ATORVASTATIN CALCIUM 10 MG PO TABS: 10.0000 mg | ORAL_TABLET | Freq: Every day | ORAL | Status: DC

## 2015-02-13 MED ORDER — LISINOPRIL 10 MG PO TABS: ORAL_TABLET | ORAL | Status: DC

## 2015-02-13 MED ORDER — BUPROPION HCL ER (XL) 150 MG PO TB24: 150.0000 mg | ORAL_TABLET | Freq: Every day | ORAL | Status: DC

## 2015-02-13 NOTE — Progress Notes (Signed)
Subjective:  This chart was scribed for Kelly Cheadle, MD by Leandra Kern, Medical Scribe. This patient was seen in Room 1 and the patient's care was started at 9:02 AM.   Patient ID: Kelly Reed, female    DOB: 01-29-1966, 49 y.o.   MRN: [id number]  Chief Complaint  Patient presents with  . Follow-up    6 month f/u DM   HPI HPI Comments: Kelly Reed is a 49 y.o. female with a medical history of DM, and HTN who presents to [facility] for a 6 month follow up of her diabetes.  Pt notes that she checks her blood glucose levels regularly. She indicates that it usually runs in the 120's range. Pt reports that she is compliant with taking metformin for her symptoms. Pt reports that she is taking Iron supplements for her anemia. PT notes that she is not checking her blood pressure regularly.Pt reports that her muscles cramps and aches are much milder than previously.   Pt states that her she is not being able to sleep at night, and she requests a medication for that. Pt denies being on any sleeping medications previously.   Additionally, pt reports that she has not had her menstrual cycle since May. She indicates that she is trying to control her diet, however she is concerned that she is still gaining weight. Pt states that taking a Vioes Phytes,-a dark green vegetables powder gives her energy to get through the day.    Pt works at [employer]. She indicates that she works about 50 hours a week and that it is very hard for her to get a day off due to her management's low leniency. She reports being tired and fatigued secondary to her job. Pt denies having panic or anxiety symptoms.   Pt notes that she still takes care of her mother.    Patient Active Problem List   Diagnosis Date Noted  . Anemia, iron deficiency 08/07/2014  . Allergic reaction to chemical substance 08/07/2014  . Muscle cramp, nocturnal 08/07/2014  . Essential hypertension, benign 12/07/2012  .  Polypharmacy 12/07/2012  . Ganglion cyst 10/22/2012  . Influenza 07/20/2012  . Routine general medical examination at a health care facility 03/29/2012  . Transaminitis 03/29/2012  . Vertigo 06/09/2011  . Hypercholesteremia 05/04/2011  . Anxiety and depression 02/01/2011  . SYNCOPE 12/27/2009  . EDEMA 03/04/2009  . ALLERGY 11/16/2006  . Diabetes mellitus, type II 10/25/2006   Past Medical History  Diagnosis Date  . Diabetes mellitus type II   . NSVD (normal spontaneous vaginal delivery)     x 2  . Hyperlipidemia   . Anxiety    Past Surgical History  Procedure Laterality Date  . Tubal ligation  1993    bilateral  . History of abd. ultrasound  12/04    negative  . 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 Surg. eval lap cholecstectomy  05/28/2006    deferred by patient   Allergies  Allergen Reactions  . Adhesive [Tape] Rash and Other (See Comments)    Peeling of skin  . Prednisone     Hyperglycemia with oral steroids   Prior to Admission medications   Medication Sig Start Date End Date Taking? Authorizing Provider  albuterol (PROVENTIL HFA;VENTOLIN HFA) 108 (90 BASE) MCG/ACT inhaler Inhale 2 puffs into the lungs every 6 (six) hours as needed for wheezing or shortness of breath. 08/07/14  Yes Shawnee Knapp,  MD  aspirin EC 81 MG tablet Take 1 tablet (81 mg total) by mouth daily. 08/07/14  Yes Shawnee Knapp, MD  atorvastatin (LIPITOR) 10 MG tablet Take 1 tablet (10 mg total) by mouth daily. 10/28/14  Yes Shawnee Knapp, MD  Azelastine HCl 0.15 % SOLN Place 1 spray into the nose 2 (two) times daily. 08/07/14  Yes Shawnee Knapp, MD  busPIRone (BUSPAR) 15 MG tablet Take 0.5 tablets (7.5 mg total) by mouth 2 (two) times daily. 08/07/14  Yes Shawnee Knapp, MD  Casanthranol-Docusate Sodium 30-100 MG CAPS Take by mouth at bedtime.     Yes Historical Provider, MD  cyclobenzaprine (FLEXERIL) 5 MG tablet Take 1/2 to 1 by mouth three times a day as needed    Yes Historical Provider, MD  ferrous  sulfate 325 (65 FE) MG tablet Take 325 mg by mouth at bedtime.     Yes Historical Provider, MD  lisinopril (PRINIVIL,ZESTRIL) 10 MG tablet TAKE 1 TABLET BY MOUTH EVERY NIGHT AT BEDTIME 01/30/14  Yes Shawnee Knapp, MD  MAGNESIUM PO Take by mouth.   Yes Historical Provider, MD  metFORMIN (GLUCOPHAGE) 1000 MG tablet Take 1 tablet (1,000 mg total) by mouth 2 (two) times daily with a meal. 10/28/14  Yes Shawnee Knapp, MD  Multiple Vitamins-Minerals (CENTRUM ADULTS PO) Take by mouth.   Yes Historical Provider, MD   History   Social History  . Marital Status: Married    Spouse Name: N/A  . Number of Children: 2  . Years of Education: N/A   Occupational History  . Shipping [employer]   Social History Main Topics  . Smoking status: Never Smoker   . Smokeless tobacco: Never Used  . Alcohol Use: 0.5 - 1.0 oz/week    1-2 drink(s) per week     Comment: Occasional beer  . Drug Use: No  . Sexual Activity: Yes   Other Topics Concern  . Not on file   Social History Narrative   2 Sons   Works at [employer]   Married    Review of Systems  Constitutional: Positive for fatigue and unexpected weight change. Negative for fever, chills, activity change and appetite change.  Gastrointestinal: Negative for nausea, vomiting, diarrhea and constipation.  Endocrine: Negative for polydipsia and polyuria.  Genitourinary: Positive for menstrual problem. Negative for dysuria, urgency, frequency, hematuria, flank pain, decreased urine volume, vaginal bleeding, enuresis and difficulty urinating.  Musculoskeletal: Negative for myalgias.  Psychiatric/Behavioral: Positive for sleep disturbance and dysphoric mood. Negative for behavioral problems, confusion, decreased concentration and agitation. The patient is not nervous/anxious.       Objective:   Physical Exam  Constitutional: She is oriented to person, place, and time. She appears well-developed and well-nourished. No distress.  HENT:  Head:  Normocephalic and atraumatic.  Eyes: EOM are normal. Pupils are equal, round, and reactive to light.  Neck: Neck supple.  Cardiovascular: Normal rate, regular rhythm, S1 normal and S2 normal.   Pulmonary/Chest: Effort normal.  Neurological: She is alert and oriented to person, place, and time. No cranial nerve deficit.  Skin: Skin is warm and dry.  Psychiatric: She has a normal mood and affect. Her behavior is normal.  Nursing note and vitals reviewed.  BP 130/82 mmHg  Pulse 95  Temp(Src) 98 F (36.7 C) (Oral)  Resp 16  Ht 5' 10.5" (1.791 m)  Wt 219 lb (99.338 kg)  BMI 30.97 kg/m2  SpO2 98%  LMP 11/27/2014  Assessment & Plan:   1. Essential hypertension, benign   2. Type 2 diabetes mellitus without complication - [id number] 6.6 <- 5.6 <-5.6 <- 5.9 <- 6.0 - work on tlc - recheck in 4 mos. Cont metformin (pt gets for free at work so does not need rx).  3. Anemia, iron deficiency - on mvi - increase iron supp  - should resolve since pt has now become perimenopausal  4. Polypharmacy   5. Anxiety and depression - due to stress from long work hours and traveling [place] every wkend to care for her aging mother w/ dementia who has sun-downing and nocturnal freq wakings which leave rare sleep for pt.  Pt denies sig anxiety and has been on low dose buspar for a long time - wean off buspar and will try wellbutrin instead to help with energy and weight.  Try prn trazodone for insomnia.  6. Hypercholesteremia - at goal, cont lipitor 10  7.     Obesity - gaining now that perimenopausal, increased stress, little sleep - could consider changing metformin to SGL-T2 or GLP-1 - she declines for now  Orders Placed This Encounter  Procedures  . Ferritin  . CBC  . Comprehensive metabolic panel    Order Specific Question:  Has the patient fasted?    Answer:  Yes  . Lipid panel    Order Specific Question:  Has the patient fasted?    Answer:  Yes  . POCT glycosylated hemoglobin (Hb A1C)    Meds  ordered this encounter  Medications  . Multiple Vitamins-Minerals (CENTRUM ADULTS PO)    Sig: Take by mouth.  Marland Kitchen MAGNESIUM PO    Sig: Take by mouth.  Marland Kitchen lisinopril (PRINIVIL,ZESTRIL) 10 MG tablet    Sig: TAKE 1 TABLET BY MOUTH EVERY NIGHT AT BEDTIME    Dispense:  90 tablet    Refill:  3  . traZODone (DESYREL) 50 MG tablet    Sig: Take 0.5-1 tablets (25-50 mg total) by mouth at bedtime as needed for sleep.    Dispense:  30 tablet    Refill:  3  . buPROPion (WELLBUTRIN XL) 150 MG 24 hr tablet    Sig: Take 1 tablet (150 mg total) by mouth daily. X 2 wks then increase to 2 tabs a day.  Call for refill    Dispense:  60 tablet    Refill:  0    I personally performed the services described in this documentation, which was scribed in my presence. The recorded information has been reviewed and considered, and addended by me as needed.  Kelly Cheadle, MD MPH  Results for orders placed or performed in visit on 02/13/15  Ferritin  Result Value Ref Range   Ferritin 12 10 - 291 ng/mL  CBC  Result Value Ref Range   WBC 5.6 4.0 - 10.5 K/uL   RBC 4.80 3.87 - 5.11 MIL/uL   Hemoglobin 13.4 12.0 - 15.0 g/dL   HCT 40.7 36.0 - 46.0 %   MCV 84.8 78.0 - 100.0 fL   MCH 27.9 26.0 - 34.0 pg   MCHC 32.9 30.0 - 36.0 g/dL   RDW 16.4 (H) 11.5 - 15.5 %   Platelets 258 150 - 400 K/uL   MPV 10.5 8.6 - 12.4 fL  Comprehensive metabolic panel  Result Value Ref Range   Sodium 138 135 - 146 mmol/L   Potassium 4.2 3.5 - 5.3 mmol/L   Chloride 103 98 - 110 mmol/L   CO2 26 20 -  31 mmol/L   Glucose, Bld 122 (H) 65 - 99 mg/dL   BUN 11 7 - 25 mg/dL   Creat 0.69 0.50 - 1.05 mg/dL   Total Bilirubin 0.5 0.2 - 1.2 mg/dL   Alkaline Phosphatase 54 33 - 130 U/L   AST 23 10 - 35 U/L   ALT 30 (H) 6 - 29 U/L   Total Protein 7.0 6.1 - 8.1 g/dL   Albumin 4.5 3.6 - 5.1 g/dL   Calcium 9.3 8.6 - 10.4 mg/dL  Lipid panel  Result Value Ref Range   Cholesterol 153 125 - 200 mg/dL   Triglycerides 105 <150 mg/dL   HDL 66 >=46  mg/dL   Total CHOL/HDL Ratio 2.3 <=5.0 Ratio   VLDL 21 <30 mg/dL   LDL Cholesterol 66 <130 mg/dL  POCT glycosylated hemoglobin (Hb A1C)  Result Value Ref Range   Hemoglobin A1C 6.6

## 2015-02-14 ENCOUNTER — Encounter: Payer: Self-pay | Admitting: Family Medicine

## 2015-04-08 ENCOUNTER — Other Ambulatory Visit: Payer: Self-pay

## 2015-04-21 ENCOUNTER — Other Ambulatory Visit: Payer: Self-pay | Admitting: Family Medicine

## 2015-04-21 DIAGNOSIS — Z1231 Encounter for screening mammogram for malignant neoplasm of breast: Secondary | ICD-10-CM

## 2015-04-27 ENCOUNTER — Ambulatory Visit
Admission: RE | Admit: 2015-04-27 | Discharge: 2015-04-27 | Disposition: A | Discharge status: Home / Self Care | Payer: PRIVATE HEALTH INSURANCE | Source: Ambulatory Visit | Attending: Family Medicine | Admitting: Family Medicine

## 2015-04-27 DIAGNOSIS — Z1231 Encounter for screening mammogram for malignant neoplasm of breast: Secondary | ICD-10-CM

## 2015-05-21 ENCOUNTER — Ambulatory Visit (INDEPENDENT_AMBULATORY_CARE_PROVIDER_SITE_OTHER): Payer: PRIVATE HEALTH INSURANCE | Admitting: Family Medicine

## 2015-05-21 VITALS — BP 124/80 | HR 70 | Temp 97.4°F | Resp 12

## 2015-05-21 DIAGNOSIS — J302 Other seasonal allergic rhinitis: Secondary | ICD-10-CM | POA: Diagnosis not present

## 2015-05-21 DIAGNOSIS — H578 Other specified disorders of eye and adnexa: Secondary | ICD-10-CM | POA: Diagnosis not present

## 2015-05-21 DIAGNOSIS — H5789 Other specified disorders of eye and adnexa: Secondary | ICD-10-CM

## 2015-05-21 MED ORDER — MONTELUKAST SODIUM 10 MG PO TABS
10.0000 mg | ORAL_TABLET | Freq: Every day | ORAL | Status: DC
Start: 2015-05-21 — End: 2015-09-16

## 2015-05-21 NOTE — Progress Notes (Signed)
[facility] [address] [place] [address] [phone]  Date:  05/21/2015   Name:  Kelly Reed   DOB:  Apr 07, 1966   MRN:  [id number]  PCP:  [name], MD    Chief Complaint: Allergies and Eye Pain   History of Present Illness:  Kelly Reed is a 49 y.o. very pleasant female patient who presents with the following:  She is here today with allergies- she works in a dusty environment- the [employer].  On Tuesday she noted that her eyes were bothering her.  The PA at work gave her some olapatadine drops but she does not feel like they are really helping her  She has needed to use her inhaler at work- just it only today She notes that her chest is congested She uses just an albuterol inhaler.   She has used some sudafed or tylenol OTC  Her eyes were bloodshot and itchy this am. Vision is normal, she wears glasses but no contacts.    She is not able to use prednisone due to severe increase of her glucose when she was given this in the past   She last used her albuterol about 4.5 hours ago  Patient Active Problem List   Diagnosis Date Noted  . Anemia, iron deficiency 08/07/2014  . Allergic reaction to chemical substance 08/07/2014  . Muscle cramp, nocturnal 08/07/2014  . Essential hypertension, benign 12/07/2012  . Polypharmacy 12/07/2012  . Ganglion cyst 10/22/2012  . Influenza 07/20/2012  . Routine general medical examination at a health care facility 03/29/2012  . Transaminitis 03/29/2012  . Vertigo 06/09/2011  . Hypercholesteremia 05/04/2011  . Anxiety and depression 02/01/2011  . SYNCOPE 12/27/2009  . EDEMA 03/04/2009  . ALLERGY 11/16/2006  . Diabetes mellitus, type II ([place]) 10/25/2006    Past Medical History  Diagnosis Date  . Diabetes mellitus type II   . NSVD (normal spontaneous vaginal delivery)     x 2  . Hyperlipidemia   . Anxiety     Past Surgical History  Procedure Laterality Date  . Tubal ligation   1993    bilateral  . History of abd. ultrasound  12/04    negative  . 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 Surg. eval lap cholecstectomy  05/28/2006    deferred by patient    Social History  Substance Use Topics  . Smoking status: Never Smoker   . Smokeless tobacco: Never Used  . Alcohol Use: 0.5 - 1.0 oz/week    1-2 drink(s) per week     Comment: Occasional beer    Family History  Problem Relation Age of Onset  . Cancer Mother     breast    Allergies  Allergen Reactions  . Adhesive [Tape] Rash and Other (See Comments)    Peeling of skin  . Prednisone     Hyperglycemia with oral steroids    Medication list has been reviewed and updated.  Current Outpatient Prescriptions on File Prior to Visit  Medication Sig Dispense Refill  . albuterol (PROVENTIL HFA;VENTOLIN HFA) 108 (90 BASE) MCG/ACT inhaler Inhale 2 puffs into the lungs every 6 (six) hours as needed for wheezing or shortness of breath. 1 Inhaler 2  . aspirin EC 81 MG tablet Take 1 tablet (81 mg total) by mouth daily.    Marland Kitchen atorvastatin (LIPITOR) 10 MG tablet Take 1 tablet (10 mg total) by mouth daily. 90 tablet 3  .  Azelastine HCl 0.15 % SOLN Place 1 spray into the nose 2 (two) times daily. 30 mL 11  . Casanthranol-Docusate Sodium 30-100 MG CAPS Take by mouth at bedtime.      . cyclobenzaprine (FLEXERIL) 5 MG tablet Take 1/2 to 1 by mouth three times a day as needed     . ferrous sulfate 325 (65 FE) MG tablet Take 325 mg by mouth at bedtime.      Marland Kitchen lisinopril (PRINIVIL,ZESTRIL) 10 MG tablet TAKE 1 TABLET BY MOUTH EVERY NIGHT AT BEDTIME 90 tablet 3  . MAGNESIUM PO Take by mouth.    . metFORMIN (GLUCOPHAGE) 1000 MG tablet Take 1 tablet (1,000 mg total) by mouth 2 (two) times daily with a meal. 180 tablet 3  . Multiple Vitamins-Minerals (CENTRUM ADULTS PO) Take by mouth.    . traZODone (DESYREL) 50 MG tablet Take 0.5-1 tablets (25-50 mg total) by mouth at bedtime as needed for sleep. 30 tablet 3  .  buPROPion (WELLBUTRIN XL) 150 MG 24 hr tablet Take 1 tablet (150 mg total) by mouth daily. X 2 wks then increase to 2 tabs a day.  Call for refill (Patient not taking: Reported on 05/21/2015) 60 tablet 0  . busPIRone (BUSPAR) 15 MG tablet Take 0.5 tablets (7.5 mg total) by mouth 2 (two) times daily. (Patient not taking: Reported on 05/21/2015) 90 tablet 4   No current facility-administered medications on file prior to visit.    Review of Systems:  As per HPI- otherwise negative.   Physical Examination: Ideal Body Weight:     Filed Vitals:   05/21/15 1550  BP: 124/80  Pulse: 70  Temp: 97.4 F (36.3 C)  Resp: 12     GEN: WDWN, NAD, Non-toxic, A & O x 3, looks well HEENT: Atraumatic, Normocephalic. Neck supple. No masses, No LAD.  Bilateral TM wnl, oropharynx normal.  PEERL,EOMI.   Mild irritation of her bilateral eyes is evident.  Nasal cavity is congested and she has a deviated septum Ears and Nose: No external deformity. CV: RRR, No M/G/R. No JVD. No thrill. No extra heart sounds. PULM: CTA B, no wheezes, crackles, rhonchi. No retractions. No resp. distress. No accessory muscle use. EXTR: No c/c/e NEURO Normal gait.  PSYCH: Normally interactive. Conversant. Not depressed or anxious appearing.  Calm demeanor.    Assessment and Plan: Other seasonal allergic rhinitis - Plan: montelukast (SINGULAIR) 10 MG tablet  Eye irritation  Here today with allergies- she is exposed to dust at her job Added singulair to her regimen Also will use genteal OTC for her dry and irritated eyes She will let me know if not bette soon She has a couple of days off so hopefully her sx will improve over the weekend    Signed Lamar Blinks, MD

## 2015-05-21 NOTE — Patient Instructions (Signed)
Use the singulair once a day for your allergies and wheezing Try some "Genteal" eye gel for your eye symptoms You might also try taking some claritin or zyrtec daily for a couple of weeks Use hte albuterol as needed Let me know if you are not feeling better soon!

## 2015-05-28 ENCOUNTER — Ambulatory Visit (INDEPENDENT_AMBULATORY_CARE_PROVIDER_SITE_OTHER): Payer: PRIVATE HEALTH INSURANCE | Admitting: Family Medicine

## 2015-05-28 VITALS — BP 156/88 | HR 96 | Temp 98.5°F | Resp 16 | Ht 70.0 in | Wt 221.8 lb

## 2015-05-28 DIAGNOSIS — T7840XD Allergy, unspecified, subsequent encounter: Secondary | ICD-10-CM

## 2015-05-28 DIAGNOSIS — Z79899 Other long term (current) drug therapy: Secondary | ICD-10-CM

## 2015-05-28 DIAGNOSIS — E119 Type 2 diabetes mellitus without complications: Secondary | ICD-10-CM

## 2015-05-28 DIAGNOSIS — J4521 Mild intermittent asthma with (acute) exacerbation: Secondary | ICD-10-CM | POA: Diagnosis not present

## 2015-05-28 DIAGNOSIS — D509 Iron deficiency anemia, unspecified: Secondary | ICD-10-CM

## 2015-05-28 DIAGNOSIS — I10 Essential (primary) hypertension: Secondary | ICD-10-CM | POA: Diagnosis not present

## 2015-05-28 DIAGNOSIS — E7800 Pure hypercholesterolemia, unspecified: Secondary | ICD-10-CM

## 2015-05-28 LAB — COMPREHENSIVE METABOLIC PANEL
ALT: 23 U/L (ref 6–29)
AST: 19 U/L (ref 10–35)
Albumin: 4.4 g/dL (ref 3.6–5.1)
Alkaline Phosphatase: 56 U/L (ref 33–130)
BUN: 11 mg/dL (ref 7–25)
CHLORIDE: 101 mmol/L (ref 98–110)
CO2: 24 mmol/L (ref 20–31)
CREATININE: 0.69 mg/dL (ref 0.50–1.05)
Calcium: 9 mg/dL (ref 8.6–10.4)
GLUCOSE: 104 mg/dL — AB (ref 65–99)
Potassium: 3.8 mmol/L (ref 3.5–5.3)
SODIUM: 138 mmol/L (ref 135–146)
Total Bilirubin: 0.4 mg/dL (ref 0.2–1.2)
Total Protein: 7 g/dL (ref 6.1–8.1)

## 2015-05-28 LAB — POCT CBC
Granulocyte percent: 67.7 %G (ref 37–80)
HCT, POC: 39.7 % (ref 37.7–47.9)
HEMOGLOBIN: 13.8 g/dL (ref 12.2–16.2)
Lymph, poc: 2 (ref 0.6–3.4)
MCH: 30.7 pg (ref 27–31.2)
MCHC: 34.6 g/dL (ref 31.8–35.4)
MCV: 88.8 fL (ref 80–97)
MID (CBC): 0.6 (ref 0–0.9)
MPV: 7.4 fL (ref 0–99.8)
PLATELET COUNT, POC: 267 10*3/uL (ref 142–424)
POC Granulocyte: 5.4 (ref 2–6.9)
POC LYMPH PERCENT: 24.5 %L (ref 10–50)
POC MID %: 7.8 %M (ref 0–12)
RBC: 4.48 M/uL (ref 4.04–5.48)
RDW, POC: 13.6 %
WBC: 8 10*3/uL (ref 4.6–10.2)

## 2015-05-28 LAB — HEMOGLOBIN A1C: Hgb A1c MFr Bld: 6.3 % — AB (ref 4.0–6.0)

## 2015-05-28 LAB — FERRITIN: Ferritin: 18 ng/mL (ref 10–291)

## 2015-05-28 LAB — POCT GLYCOSYLATED HEMOGLOBIN (HGB A1C): HEMOGLOBIN A1C: 6.3

## 2015-05-28 LAB — POCT SEDIMENTATION RATE: POCT SED RATE: 18 mm/h (ref 0–22)

## 2015-05-28 MED ORDER — AZITHROMYCIN 250 MG PO TABS: ORAL_TABLET | ORAL | Status: DC

## 2015-05-28 MED ORDER — METHYLPREDNISOLONE ACETATE 80 MG/ML IJ SUSP
80.0000 mg | Freq: Once | INTRAMUSCULAR | Status: AC
  Administered 2015-05-28: 80 mg via INTRAMUSCULAR

## 2015-05-28 MED ORDER — GUAIFENESIN ER 1200 MG PO TB12: 1.0000 | ORAL_TABLET | Freq: Two times a day (BID) | ORAL | Status: DC | PRN

## 2015-05-28 MED ORDER — LEVOCETIRIZINE DIHYDROCHLORIDE 5 MG PO TABS: 5.0000 mg | ORAL_TABLET | Freq: Every evening | ORAL | Status: DC

## 2015-05-28 MED ORDER — MOMETASONE FUROATE 50 MCG/ACT NA SUSP: 2.0000 | Freq: Every day | NASAL | Status: DC

## 2015-05-28 MED ORDER — CHLORPHENIRAMINE-DM 1-7.5 MG/5ML PO LIQD: 15.0000 mL | ORAL | Status: DC | PRN

## 2015-05-28 MED ORDER — ALBUTEROL SULFATE 108 (90 BASE) MCG/ACT IN AEPB: 2.0000 | INHALATION_SPRAY | RESPIRATORY_TRACT | Status: DC | PRN

## 2015-05-28 NOTE — Patient Instructions (Signed)
You can try the xyzal instead of the loratadine if it not to expensive. I put in for the same cough syrup without the sudafed in it. mucinex might help thin some of your congestion so you get it out. Consider a sinus rinse. Start nasonex along with your azelastine nasal spray. Try to fill all of the pro-air prior to the coupon expiring in 6 wks. If you develop more fever, chills, blowing out blood, worsening, fill the zpack. If you need something stronger for cough or sleep, call.  Hay Fever Hay fever is an allergic reaction to particles in the air. It cannot be passed from person to person. It cannot be cured, but it can be controlled. CAUSES  Hay fever is caused by something that triggers an allergic reaction (allergens). The following are examples of allergens:  Ragweed.  Feathers.  Animal dander.  Grass and tree pollens.  Cigarette smoke.  House dust.  Pollution. SYMPTOMS   Sneezing.  Runny or stuffy nose.  Tearing eyes.  Itchy eyes, nose, mouth, throat, skin, or other area.  Sore throat.  Headache.  Decreased sense of smell or taste. DIAGNOSIS Your caregiver will perform a physical exam and ask questions about the symptoms you are having.Allergy testing may be done to determine exactly what triggers your hay fever.  TREATMENT   Over-the-counter medicines may help symptoms. These include:  Antihistamines.  Decongestants. These may help with nasal congestion.  Your caregiver may prescribe medicines if over-the-counter medicines do not work.  Some people benefit from allergy shots when other medicines are not helpful. HOME CARE INSTRUCTIONS   Avoid the allergen that is causing your symptoms, if possible.  Take all medicine as told by your caregiver. SEEK MEDICAL CARE IF:   You have severe allergy symptoms and your current medicines are not helping.  Your treatment was working at one time, but you are now experiencing symptoms.  You have sinus  congestion and pressure.  You develop a fever or headache.  You have thick nasal discharge.  You have asthma and have a worsening cough and wheezing. SEEK IMMEDIATE MEDICAL CARE IF:   You have swelling of your tongue or lips.  You have trouble breathing.  You feel lightheaded or like you are going to faint.  You have cold sweats.  You have a fever.   This information is not intended to replace advice given to you by your health care provider. Make sure you discuss any questions you have with your health care provider.   Document Released: 06/26/2005 Document Revised: 09/18/2011 Document Reviewed: 01/06/2015 Elsevier Interactive Patient Education [employer].
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
## 2015-05-30 ENCOUNTER — Encounter: Payer: Self-pay | Admitting: Family Medicine

## 2015-06-09 ENCOUNTER — Encounter: Payer: Self-pay | Admitting: Family Medicine

## 2015-07-26 ENCOUNTER — Telehealth: Payer: Self-pay

## 2015-07-26 NOTE — Telephone Encounter (Signed)
Patient is returning a missed phone call. No name was left on her voicemail.  Patient is also requesting a refill for metformin sent to [facility] on [address].

## 2015-07-27 MED ORDER — METFORMIN HCL 1000 MG PO TABS: 1000.0000 mg | ORAL_TABLET | Freq: Two times a day (BID) | ORAL | Status: DC

## 2015-07-27 NOTE — Telephone Encounter (Signed)
Rx sent in

## 2015-07-27 NOTE — Telephone Encounter (Signed)
Left VM letting pt know 

## 2015-08-17 ENCOUNTER — Other Ambulatory Visit: Payer: Self-pay | Admitting: Family Medicine

## 2015-08-19 ENCOUNTER — Ambulatory Visit: Payer: PRIVATE HEALTH INSURANCE | Admitting: Family Medicine

## 2015-09-06 ENCOUNTER — Ambulatory Visit (INDEPENDENT_AMBULATORY_CARE_PROVIDER_SITE_OTHER): Payer: PRIVATE HEALTH INSURANCE | Admitting: Physician Assistant

## 2015-09-06 VITALS — BP 130/78 | HR 88 | Temp 97.7°F | Resp 20 | Ht 71.25 in | Wt 222.6 lb

## 2015-09-06 DIAGNOSIS — J111 Influenza due to unidentified influenza virus with other respiratory manifestations: Secondary | ICD-10-CM

## 2015-09-06 DIAGNOSIS — R059 Cough, unspecified: Secondary | ICD-10-CM

## 2015-09-06 DIAGNOSIS — R69 Illness, unspecified: Principal | ICD-10-CM

## 2015-09-06 DIAGNOSIS — R05 Cough: Secondary | ICD-10-CM | POA: Diagnosis not present

## 2015-09-06 MED ORDER — HYDROCODONE-HOMATROPINE 5-1.5 MG/5ML PO SYRP: 2.5000 mL | ORAL_SOLUTION | Freq: Every evening | ORAL | Status: DC | PRN

## 2015-09-06 MED ORDER — OSELTAMIVIR PHOSPHATE 75 MG PO CAPS: 75.0000 mg | ORAL_CAPSULE | Freq: Two times a day (BID) | ORAL | Status: DC

## 2015-09-06 NOTE — Progress Notes (Signed)
09/06/2015 11:54 AM   DOB: [birth year] / MRN: [id number]  SUBJECTIVE:  Kelly Reed is a 50 y.o. female presenting for for the evaluation of myalgia and subjective fever that started 2 days ago.  She associates cough.  She has a history of diabetes mellitus cares for her elderly mother who is ill.  She denies difficulty breathing and jaw pain. Treatments tried thus far include several OTC preps with fair to poor relief. She reports sick contacts.  She is allergic to adhesive and prednisone.   She  has a past medical history of Diabetes mellitus type II; NSVD (normal spontaneous vaginal delivery); Hyperlipidemia; and Anxiety.    She  reports that she has never smoked. She has never used smokeless tobacco. She reports that she drinks about 0.5 - 1.0 oz of alcohol per week. She reports that she does not use illicit drugs. She  reports that she currently engages in sexual activity. The patient  has past surgical history that includes Tubal ligation (1993); History of Abd. 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 eval lap cholecstectomy (05/28/2006).  Her family history includes Cancer in her mother.  Review of Systems  Constitutional: Positive for fever, chills and malaise/fatigue.  Eyes: Negative for blurred vision.  Respiratory: Negative for cough and shortness of breath.   Cardiovascular: Negative for chest pain.  Gastrointestinal: Negative for nausea and abdominal pain.  Genitourinary: Negative for dysuria, urgency and frequency.  Musculoskeletal: Positive for myalgias.  Skin: Negative for rash.  Neurological: Positive for headaches. Negative for dizziness and tingling.  Psychiatric/Behavioral: Negative for depression. The patient is not nervous/anxious.     Problem list and medications reviewed and updated by myself where necessary, and exist elsewhere in the encounter.   OBJECTIVE:  BP 130/78 mmHg  Pulse 88  Temp(Src) 97.7 F (36.5 C) (Oral)  Resp 20  Ht 5'  11.25" (1.81 m)  Wt 222 lb 9.6 oz (100.971 kg)  BMI 30.82 kg/m2  SpO2 98%  Physical Exam  Constitutional: She is oriented to person, place, and time. She appears well-nourished. No distress.  HENT:  Right Ear: Hearing and tympanic membrane normal.  Left Ear: Hearing and tympanic membrane normal.  Nose: Mucosal edema present. No sinus tenderness. Right sinus exhibits no maxillary sinus tenderness and no frontal sinus tenderness. Left sinus exhibits no maxillary sinus tenderness and no frontal sinus tenderness.  Mouth/Throat: Uvula is midline, oropharynx is clear and moist and mucous membranes are normal.  Eyes: EOM are normal. Pupils are equal, round, and reactive to light.  Cardiovascular: Normal rate.   Pulmonary/Chest: Effort normal.  Abdominal: She exhibits no distension.  Neurological: She is alert and oriented to person, place, and time. No cranial nerve deficit. Skin: Skin is dry. She is not diaphoretic.  Vitals reviewed.  No results found for this or any previous visit (from the past 48 hour(s)).  ASSESSMENT AND PLAN:  Esther was seen today for diarrhea, fever, nasal congestion, cough and sinusitis.  Diagnoses and all orders for this visit:  Influenza-like illness: She is at risk of complications. We are nearing the end of the treatment window however will treat DM2.  RTC in 2-3 days if symptoms worsen or change.    -     oseltamivir (TAMIFLU) 75 MG capsule; Take 1 capsule (75 mg total) by mouth 2 (two) times daily.  Cough: Her lungs are clear to auscultation.  -     HYDROcodone-homatropine (HYCODAN) 5-1.5 MG/5ML syrup; Take 2.5-5 mLs by mouth at bedtime  as needed.    The patient was advised to call or return to clinic if she does not see an improvement in symptoms or to seek the care of the closest emergency department if she worsens with the above plan.   Philis Fendt, MHS, PA-C [facility] 09/06/2015 11:54 AM

## 2015-09-16 ENCOUNTER — Ambulatory Visit (INDEPENDENT_AMBULATORY_CARE_PROVIDER_SITE_OTHER): Payer: PRIVATE HEALTH INSURANCE | Admitting: Family Medicine

## 2015-09-16 ENCOUNTER — Encounter: Payer: Self-pay | Admitting: Family Medicine

## 2015-09-16 VITALS — BP 123/79 | HR 80 | Temp 98.0°F | Resp 16 | Ht 71.0 in | Wt 220.0 lb

## 2015-09-16 DIAGNOSIS — I10 Essential (primary) hypertension: Secondary | ICD-10-CM | POA: Diagnosis not present

## 2015-09-16 DIAGNOSIS — J302 Other seasonal allergic rhinitis: Secondary | ICD-10-CM

## 2015-09-16 DIAGNOSIS — E119 Type 2 diabetes mellitus without complications: Secondary | ICD-10-CM

## 2015-09-16 DIAGNOSIS — J0191 Acute recurrent sinusitis, unspecified: Secondary | ICD-10-CM

## 2015-09-16 DIAGNOSIS — E7800 Pure hypercholesterolemia, unspecified: Secondary | ICD-10-CM

## 2015-09-16 DIAGNOSIS — R252 Cramp and spasm: Secondary | ICD-10-CM

## 2015-09-16 DIAGNOSIS — D509 Iron deficiency anemia, unspecified: Secondary | ICD-10-CM

## 2015-09-16 DIAGNOSIS — Z5181 Encounter for therapeutic drug level monitoring: Secondary | ICD-10-CM | POA: Diagnosis not present

## 2015-09-16 LAB — POCT CBC
GRANULOCYTE PERCENT: 67.9 % (ref 37–80)
HEMATOCRIT: 39.6 % (ref 37.7–47.9)
HEMOGLOBIN: 13.9 g/dL (ref 12.2–16.2)
LYMPH, POC: 1.7 (ref 0.6–3.4)
MCH, POC: 31.8 pg — AB (ref 27–31.2)
MCHC: 35.1 g/dL (ref 31.8–35.4)
MCV: 90.5 fL (ref 80–97)
MID (cbc): 0.4 (ref 0–0.9)
MPV: 8.2 fL (ref 0–99.8)
POC GRANULOCYTE: 4.5 (ref 2–6.9)
POC LYMPH PERCENT: 26.2 %L (ref 10–50)
POC MID %: 5.9 % (ref 0–12)
Platelet Count, POC: 236 10*3/uL (ref 142–424)
RBC: 4.38 M/uL (ref 4.04–5.48)
RDW, POC: 13.4 %
WBC: 6.6 10*3/uL (ref 4.6–10.2)

## 2015-09-16 LAB — COMPREHENSIVE METABOLIC PANEL
ALBUMIN: 4.5 g/dL (ref 3.6–5.1)
ALK PHOS: 50 U/L (ref 33–130)
ALT: 25 U/L (ref 6–29)
AST: 18 U/L (ref 10–35)
BILIRUBIN TOTAL: 0.5 mg/dL (ref 0.2–1.2)
BUN: 11 mg/dL (ref 7–25)
CALCIUM: 9.8 mg/dL (ref 8.6–10.4)
CO2: 25 mmol/L (ref 20–31)
Chloride: 103 mmol/L (ref 98–110)
Creat: 0.65 mg/dL (ref 0.50–1.05)
GLUCOSE: 104 mg/dL — AB (ref 65–99)
POTASSIUM: 3.9 mmol/L (ref 3.5–5.3)
Sodium: 139 mmol/L (ref 135–146)
Total Protein: 7.2 g/dL (ref 6.1–8.1)

## 2015-09-16 LAB — POCT GLYCOSYLATED HEMOGLOBIN (HGB A1C): Hemoglobin A1C: 6.3

## 2015-09-16 LAB — LIPID PANEL
CHOLESTEROL: 164 mg/dL (ref 125–200)
HDL: 58 mg/dL (ref >=46)
LDL Cholesterol: 73 mg/dL (ref <130)
Total CHOL/HDL Ratio: 2.8 Ratio (ref <=5.0)
Triglycerides: 164 mg/dL — ABNORMAL HIGH (ref <150)
VLDL: 33 mg/dL — ABNORMAL HIGH (ref <30)

## 2015-09-16 MED ORDER — METHYLPREDNISOLONE ACETATE 80 MG/ML IJ SUSP
80.0000 mg | Freq: Once | INTRAMUSCULAR | Status: AC
  Administered 2015-09-16: 80 mg via INTRAMUSCULAR

## 2015-09-16 MED ORDER — AMOXICILLIN-POT CLAVULANATE 875-125 MG PO TABS: 1.0000 | ORAL_TABLET | Freq: Two times a day (BID) | ORAL | Status: DC

## 2015-09-16 MED ORDER — TRAZODONE HCL 50 MG PO TABS: 50.0000 mg | ORAL_TABLET | Freq: Every day | ORAL | Status: DC

## 2015-09-16 MED ORDER — MONTELUKAST SODIUM 10 MG PO TABS: 10.0000 mg | ORAL_TABLET | Freq: Every day | ORAL | Status: DC

## 2015-09-16 NOTE — Progress Notes (Signed)
Subjective:    Patient ID: Kelly Reed, female    DOB: 29-Nov-1965, 50 y.o.   MRN: [id number] Chief Complaint  Patient presents with  . Diabetes    HPI  Kelly Reed is a delightful 50 yo woman here today for follow-up on her chronic medical conditions including diabetes. She receives many of her medicines free through her employer - [employer].    Novel is under a lot of stress - she has a job that is very strict - absolutely no sick days and on the weekends she travels sev hours to stay the weekend with her elderly mother who has been declining with some dementia.  She was seen a little over a week ago by one of my colleagues for a viral flu-like illness with sinus cong, dry cough, fever, and diarrhea for which she was put on tamiflu. No flu test done since outbreak is so prevalent.  She had gotten her flu shot at work.  DM: Taking an asa 81 and lisinopril 10.  On metformin 1000mg  bid. hgba1c 3 mos prior was 6.3. She does check her cbgs regularly. Microabl done 07/2014. optho?  HPL: On lipitor 10  HTN: On lisinpril 10  Seasonal allergies: Using azelastine nasal spray, singulair, xyzal, and nasonex. Also has an albuterol inhaler and antihistamine eye drops.  Iron deficiency anemeia:  At last visit 3 mos prior, ferritin was 18 so is taking iron supplement.  Still having menses?  Takes trazodone, flexeril, and magnesium at night prn for insommnia and leg crapes  Depression screen [facility] 2/9 09/16/2015 09/06/2015 05/28/2015 05/21/2015 12/29/2014  Decreased Interest 0 0 0 0 0  Down, Depressed, Hopeless 0 0 0 0 0  PHQ - 2 Score 0 0 0 0 0    Past Medical History  Diagnosis Date  . Diabetes mellitus type II   . NSVD (normal spontaneous vaginal delivery)     x 2  . Hyperlipidemia   . Anxiety    Past Surgical History  Procedure Laterality Date  . Tubal ligation  1993    bilateral  . History of abd. ultrasound  12/04    negative  . 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  Surg. eval lap cholecstectomy  05/28/2006    deferred by patient   Current Outpatient Prescriptions on File Prior to Visit  Medication Sig Dispense Refill  . aspirin EC 81 MG tablet Take 1 tablet (81 mg total) by mouth daily.    Marland Kitchen atorvastatin (LIPITOR) 10 MG tablet Take 1 tablet (10 mg total) by mouth daily. 90 tablet 3  . Azelastine HCl 0.15 % SOLN Place 1 spray into the nose 2 (two) times daily. 30 mL 11  . Casanthranol-Docusate Sodium 30-100 MG CAPS Take by mouth at bedtime.      . cyclobenzaprine (FLEXERIL) 5 MG tablet Take 1/2 to 1 by mouth three times a day as needed     . ferrous sulfate 325 (65 FE) MG tablet Take 325 mg by mouth at bedtime.      . Guaifenesin (MUCINEX MAXIMUM STRENGTH) 1200 MG TB12 Take 1 tablet (1,200 mg total) by mouth every 12 (twelve) hours as needed. 14 tablet 1  . levocetirizine (XYZAL) 5 MG tablet Take 1 tablet (5 mg total) by mouth every evening. 30 tablet 11  . lisinopril (PRINIVIL,ZESTRIL) 10 MG tablet TAKE 1 TABLET BY MOUTH EVERY NIGHT AT BEDTIME 90 tablet 3  . MAGNESIUM PO Take by mouth.    Marland Kitchen  metFORMIN (GLUCOPHAGE) 1000 MG tablet Take 1 tablet (1,000 mg total) by mouth 2 (two) times daily with a meal. 180 tablet 1  . mometasone (NASONEX) 50 MCG/ACT nasal spray Place 2 sprays into the nose daily. 17 g 12  . Multiple Vitamins-Minerals (CENTRUM ADULTS PO) Take by mouth.     No current facility-administered medications on file prior to visit.   Allergies  Allergen Reactions  . Adhesive [Tape] Rash and Other (See Comments)    Peeling of skin  . Prednisone     Hyperglycemia with oral steroids   Family History  Problem Relation Age of Onset  . Cancer Mother     breast   Social History   Social History  . Marital Status: Married    Spouse Name: N/A  . Number of Children: 2  . Years of Education: N/A   Occupational History  . Shipping Dept. [employer]   Social History Main Topics  . Smoking status: Never Smoker   . Smokeless tobacco:  Never Used  . Alcohol Use: 0.5 - 1.0 oz/week    1-2 drink(s) per week     Comment: Occasional beer  . Drug Use: No  . Sexual Activity: Yes   Other Topics Concern  . None   Social History Narrative   2 Sons   Works at [employer]   Married    Review of Systems  Constitutional: Positive for fatigue. Negative for fever, chills, activity change and appetite change.  HENT: Positive for congestion, postnasal drip, rhinorrhea and sinus pressure. Negative for hearing loss, sore throat and voice change.   Eyes: Negative for visual disturbance.  Respiratory: Positive for cough. Negative for chest tightness, shortness of breath and wheezing.   Cardiovascular: Negative for chest pain and palpitations.  Gastrointestinal: Negative for nausea, vomiting, abdominal pain, diarrhea and constipation.  Genitourinary: Negative for dysuria and frequency.  Musculoskeletal: Positive for myalgias, back pain and arthralgias. Negative for joint swelling and gait problem.  Neurological: Negative for weakness and numbness.  Psychiatric/Behavioral: Positive for sleep disturbance. Negative for dysphoric mood. The patient is not nervous/anxious.        Objective:   Physical Exam  Constitutional: She is oriented to person, place, and time. She appears well-developed and well-nourished. No distress.  HENT:  Head: Normocephalic and atraumatic.  Right Ear: External ear normal.  Left Ear: External ear normal.  Eyes: Conjunctivae are normal. No scleral icterus.  Neck: Normal range of motion. Neck supple. No thyromegaly present.  Cardiovascular: Normal rate, regular rhythm, normal heart sounds and intact distal pulses.   Pulmonary/Chest: Effort normal and breath sounds normal. No respiratory distress.  Musculoskeletal: She exhibits no edema.  Lymphadenopathy:    She has no cervical adenopathy.  Neurological: She is alert and oriented to person, place, and time.  Skin: Skin is warm and dry. She is not  diaphoretic. No erythema.  Psychiatric: She has a normal mood and affect. Her behavior is normal.   BP 123/79 mmHg  Pulse 80  Temp(Src) 98 F (36.7 C)  Resp 16  Ht 5\' 11"  (1.803 m)  Wt 220 lb (99.791 kg)  BMI 30.70 kg/m2     Assessment & Plan:   1. Type 2 diabetes mellitus without complication, without long-term current use of insulin ([place])   2. Essential hypertension, benign   3. Hypercholesteremia   4. Anemia, iron deficiency   5. Muscle cramp, nocturnal   6. Seasonal allergic rhinitis   7. Medication monitoring encounter   8.  Other seasonal allergic rhinitis   9. Acute recurrent sinusitis, unspecified location     Orders Placed This Encounter  Procedures  . Comprehensive metabolic panel    Order Specific Question:  Has the patient fasted?    Answer:  Yes  . Lipid panel    Order Specific Question:  Has the patient fasted?    Answer:  Yes  . Microalbumin/Creatinine Ratio, Urine  . POCT glycosylated hemoglobin (Hb A1C)  . POCT CBC  . HM Diabetes Foot Exam    Meds ordered this encounter  Medications  . montelukast (SINGULAIR) 10 MG tablet    Sig: Take 1 tablet (10 mg total) by mouth at bedtime.    Dispense:  90 tablet    Refill:  3  . traZODone (DESYREL) 50 MG tablet    Sig: Take 1 tablet (50 mg total) by mouth at bedtime.    Dispense:  90 tablet    Refill:  1    No more refills without office visit  . amoxicillin-clavulanate (AUGMENTIN) 875-125 MG tablet    Sig: Take 1 tablet by mouth 2 (two) times daily.    Dispense:  20 tablet    Refill:  0  . methylPREDNISolone acetate (DEPO-MEDROL) injection 80 mg    Sig:     [name], MD MPH  Results for orders placed or performed in visit on 09/16/15  Comprehensive metabolic panel  Result Value Ref Range   Sodium 139 135 - 146 mmol/L   Potassium 3.9 3.5 - 5.3 mmol/L   Chloride 103 98 - 110 mmol/L   CO2 25 20 - 31 mmol/L   Glucose, Bld 104 (H) 65 - 99 mg/dL   BUN 11 7 - 25 mg/dL   Creat 0.65 0.50 - 1.05 mg/dL    Total Bilirubin 0.5 0.2 - 1.2 mg/dL   Alkaline Phosphatase 50 33 - 130 U/L   AST 18 10 - 35 U/L   ALT 25 6 - 29 U/L   Total Protein 7.2 6.1 - 8.1 g/dL   Albumin 4.5 3.6 - 5.1 g/dL   Calcium 9.8 8.6 - 10.4 mg/dL  Lipid panel  Result Value Ref Range   Cholesterol 164 125 - 200 mg/dL   Triglycerides 164 (H) <150 mg/dL   HDL 58 >=46 mg/dL   Total CHOL/HDL Ratio 2.8 <=5.0 Ratio   VLDL 33 (H) <30 mg/dL   LDL Cholesterol 73 <130 mg/dL  Microalbumin/Creatinine Ratio, Urine  Result Value Ref Range   Creatinine, Urine 38 20 - 320 mg/dL   Microalb, Ur 0.3 Not estab mg/dL   Microalb Creat Ratio 8 <30 mcg/mg creat  POCT glycosylated hemoglobin (Hb A1C)  Result Value Ref Range   Hemoglobin A1C 6.3   POCT CBC  Result Value Ref Range   WBC 6.6 4.6 - 10.2 K/uL   Lymph, poc 1.7 0.6 - 3.4   POC LYMPH PERCENT 26.2 10 - 50 %L   MID (cbc) 0.4 0 - 0.9   POC MID % 5.9 0 - 12 %M   POC Granulocyte 4.5 2 - 6.9   Granulocyte percent 67.9 37 - 80 %G   RBC 4.38 4.04 - 5.48 M/uL   Hemoglobin 13.9 12.2 - 16.2 g/dL   HCT, POC 39.6 37.7 - 47.9 %   MCV 90.5 80 - 97 fL   MCH, POC 31.8 (A) 27 - 31.2 pg   MCHC 35.1 31.8 - 35.4 g/dL   RDW, POC 13.4 %   Platelet Count, POC 236 142 -  424 K/uL   MPV 8.2 0 - 99.8 fL

## 2015-09-17 LAB — MICROALBUMIN / CREATININE URINE RATIO
CREATININE, URINE: 38 mg/dL (ref 20–320)
MICROALB UR: 0.3 mg/dL
Microalb Creat Ratio: 8 mcg/mg creat (ref <30)

## 2015-09-28 ENCOUNTER — Encounter: Payer: Self-pay | Admitting: Family Medicine

## 2015-10-05 ENCOUNTER — Other Ambulatory Visit: Payer: Self-pay | Admitting: Family Medicine

## 2015-11-12 ENCOUNTER — Ambulatory Visit (INDEPENDENT_AMBULATORY_CARE_PROVIDER_SITE_OTHER): Payer: PRIVATE HEALTH INSURANCE | Admitting: Family Medicine

## 2015-11-12 VITALS — BP 130/78 | HR 90 | Temp 98.1°F | Resp 17 | Ht 71.0 in | Wt 224.0 lb

## 2015-11-12 DIAGNOSIS — L255 Unspecified contact dermatitis due to plants, except food: Secondary | ICD-10-CM | POA: Diagnosis not present

## 2015-11-12 MED ORDER — HYDROXYZINE HCL 25 MG PO TABS: 25.0000 mg | ORAL_TABLET | ORAL | Status: DC | PRN

## 2015-11-12 MED ORDER — METHYLPREDNISOLONE ACETATE 80 MG/ML IJ SUSP
120.0000 mg | Freq: Once | INTRAMUSCULAR | Status: AC
  Administered 2015-11-12: 120 mg via INTRAMUSCULAR

## 2015-11-12 MED ORDER — TRIAMCINOLONE ACETONIDE 0.025 % EX OINT: 1.0000 "application " | TOPICAL_OINTMENT | Freq: Four times a day (QID) | CUTANEOUS | Status: DC

## 2015-11-12 NOTE — Progress Notes (Signed)
Subjective:  By signing my name below, I, Kelly Reed, attest that this documentation has been prepared under the direction and in the presence of [name], MD.  Electronically Signed: Thea Alken, ED Scribe. 11/12/2015. 3:04 PM.   Patient ID: Kelly Reed, female    DOB: January 07, 1966, 50 y.o.   MRN: [id number]  HPI Chief Complaint  Patient presents with  . Poison Ivy    HPI Comments: Kelly Reed is a 50 y.o. female who presents to the [facility] complaining of a itchy, erythematous rash noticed 5-6 days ago. Pt suspects this is poison ivy. States she was doing yard work with her husband last week. She first noticed rash to left forearm 5 days ago but rash had spread to face the following day. Rash is now present to bilateral arms, inguinal region, chest, and temporal region of face bilaterally. She has tried calamine lotion and benadryl  without relief. Pt has also tried making her own paste with backing soda without relief. He sugars have been 110-125.  Patient Active Problem List   Diagnosis Date Noted  . Anemia, iron deficiency 08/07/2014  . Allergic reaction to chemical substance 08/07/2014  . Muscle cramp, nocturnal 08/07/2014  . Essential hypertension, benign 12/07/2012  . Polypharmacy 12/07/2012  . Ganglion cyst 10/22/2012  . Routine general medical examination at a health care facility 03/29/2012  . Transaminitis 03/29/2012  . Vertigo 06/09/2011  . Hypercholesteremia 05/04/2011  . Anxiety and depression 02/01/2011  . SYNCOPE 12/27/2009  . EDEMA 03/04/2009  . Allergic state 11/16/2006  . Diabetes mellitus, type II ([place]) 10/25/2006   Past Medical History  Diagnosis Date  . Diabetes mellitus type II   . NSVD (normal spontaneous vaginal delivery)     x 2  . Hyperlipidemia   . Anxiety    Past Surgical History  Procedure Laterality Date  . Tubal ligation  1993    bilateral  . History of abd. ultrasound  12/04    negative  . 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 Surg. eval lap cholecstectomy  05/28/2006    deferred by patient   Allergies  Allergen Reactions  . Adhesive [Tape] Rash and Other (See Comments)    Peeling of skin  . Prednisone     Hyperglycemia with oral steroids   Prior to Admission medications   Medication Sig Start Date End Date Taking? Authorizing Provider  aspirin EC 81 MG tablet Take 1 tablet (81 mg total) by mouth daily. 08/07/14  Yes Shawnee Knapp, MD  atorvastatin (LIPITOR) 10 MG tablet Take 1 tablet (10 mg total) by mouth daily. 02/13/15  Yes Shawnee Knapp, MD  Azelastine HCl 0.15 % SOLN Place 1 spray into the nose 2 (two) times daily. 08/07/14  Yes Shawnee Knapp, MD  Casanthranol-Docusate Sodium 30-100 MG CAPS Take by mouth at bedtime.     Yes Historical Provider, MD  cyclobenzaprine (FLEXERIL) 5 MG tablet Reported on 11/12/2015   Yes Historical Provider, MD  ferrous sulfate 325 (65 FE) MG tablet Take 325 mg by mouth at bedtime.     Yes Historical Provider, MD  levocetirizine (XYZAL) 5 MG tablet Take 1 tablet (5 mg total) by mouth every evening. 05/28/15  Yes Shawnee Knapp, MD  lisinopril (PRINIVIL,ZESTRIL) 10 MG tablet TAKE 1 TABLET BY MOUTH EVERY NIGHT AT BEDTIME 02/13/15  Yes Shawnee Knapp, MD  MAGNESIUM PO Take by mouth.   Yes Historical Provider,  MD  metFORMIN (GLUCOPHAGE) 1000 MG tablet Take 1 tablet (1,000 mg total) by mouth 2 (two) times daily with a meal. 07/27/15  Yes Shawnee Knapp, MD  mometasone (NASONEX) 50 MCG/ACT nasal spray Place 2 sprays into the nose daily. 05/28/15  Yes Shawnee Knapp, MD  montelukast (SINGULAIR) 10 MG tablet Take 1 tablet (10 mg total) by mouth at bedtime. 09/16/15  Yes Shawnee Knapp, MD  Multiple Vitamins-Minerals (CENTRUM ADULTS PO) Take by mouth.   Yes Historical Provider, MD  traZODone (DESYREL) 50 MG tablet Take 1 tablet (50 mg total) by mouth at bedtime. 09/16/15  Yes Shawnee Knapp, MD  VENTOLIN HFA 108 (90 Base) MCG/ACT inhaler INHALE 2 PUFFS BY MOUTH INTO THE LUNGS EVERY 6 HOURS AS  NEEDED WHEEZING OR SHORTNESS OF BREATH 10/05/15  Yes Shawnee Knapp, MD  Guaifenesin Resurgens [facility] MAXIMUM STRENGTH) 1200 MG TB12 Take 1 tablet (1,200 mg total) by mouth every 12 (twelve) hours as needed. Patient not taking: Reported on 11/12/2015 05/28/15   Shawnee Knapp, MD   Social History   Social History  . Marital Status: Married    Spouse Name: N/A  . Number of Children: 2  . Years of Education: N/A   Occupational History  . Shipping [employer]   Social History Main Topics  . Smoking status: Never Smoker   . Smokeless tobacco: Never Used  . Alcohol Use: 0.5 - 1.0 oz/week    1-2 drink(s) per week     Comment: Occasional beer  . Drug Use: No  . Sexual Activity: Yes   Other Topics Concern  . Not on file   Social History Narrative   2 Sons   Works at [employer]   Married   Review of Systems  Constitutional: Negative for fever and chills.  HENT: Negative for congestion.   Musculoskeletal: Positive for myalgias.  Skin: Positive for color change and rash.  Allergic/Immunologic: Positive for environmental allergies.    Objective:   Physical Exam  Constitutional: She is oriented to person, place, and time. She appears well-developed and well-nourished. No distress.  HENT:  Head: Normocephalic and atraumatic.  Eyes: Conjunctivae and EOM are normal.  Neck: Neck supple.  Cardiovascular: Normal rate, regular rhythm and normal heart sounds.   No murmur heard. Pulmonary/Chest: Effort normal and breath sounds normal. She has no wheezes. She has no rales.  Musculoskeletal: Normal range of motion.  Lymphadenopathy:    She has no cervical adenopathy.  Neurological: She is alert and oriented to person, place, and time.  Skin: Skin is warm and dry.  erythemic pap vesicular isolated rash over chest, forearms temples and pelvic area with excoriations.   Psychiatric: She has a normal mood and affect. Her behavior is normal.  Nursing note and vitals reviewed.  Filed  Vitals:   11/12/15 1411  BP: 130/78  Pulse: 90  Temp: 98.1 F (36.7 C)  TempSrc: Oral  Resp: 17  Height: 5\' 11"  (1.803 m)  Weight: 224 lb (101.606 kg)  SpO2: 97%   Assessment & Plan:   1. Rhus dermatitis      Meds ordered this encounter  Medications  . methylPREDNISolone acetate (DEPO-MEDROL) injection 120 mg    Sig:   . hydrOXYzine (ATARAX/VISTARIL) 25 MG tablet    Sig: Take 1 tablet (25 mg total) by mouth every 4 (four) hours as needed for itching.    Dispense:  60 tablet    Refill:  0  . triamcinolone (KENALOG) 0.025 %  ointment    Sig: Apply 1 application topically 4 (four) times daily. For 10 days to poison ivy    Dispense:  454 g    Refill:  0    I personally performed the services described in this documentation, which was scribed in my presence. The recorded information has been reviewed and considered, and addended by me as needed.  [name], MD MPH

## 2015-11-12 NOTE — Patient Instructions (Addendum)
     IF you received an x-ray today, you will receive an invoice from [facility]. Please contact [facility] at [phone] with questions or concerns regarding your invoice.   IF you received labwork today, you will receive an invoice from [employer]. Please contact [facility] at [phone] with questions or concerns regarding your invoice.   Our billing staff will not be able to assist you with questions regarding bills from these companies.  You will be contacted with the lab results as soon as they are available. The fastest way to get your results is to activate your My Chart account. Instructions are located on the last page of this paperwork. If you have not heard from [place] regarding the results in 2 weeks, please contact this office.     Poison [employer] ivy is a inflammation of the skin (contact dermatitis) caused by touching the allergens on the leaves of the ivy plant following previous exposure to the plant. The rash usually appears 48 hours after exposure. The rash is usually bumps (papules) or blisters (vesicles) in a linear pattern. Depending on your own sensitivity, the rash may simply cause redness and itching, or it may also progress to blisters which may break open. These must be well cared for to prevent secondary bacterial (germ) infection, followed by scarring. Keep any open areas dry, clean, dressed, and covered with an antibacterial ointment if needed. The eyes may also get puffy. The puffiness is worst in the morning and gets better as the day progresses. This dermatitis usually heals without scarring, within 2 to 3 weeks without treatment. HOME CARE INSTRUCTIONS  Thoroughly wash with soap and water as soon as you have been exposed to poison ivy. You have about one half hour to remove the plant resin before it will cause the rash. This washing will destroy the oil or antigen on the skin that is causing, or will cause, the rash. Be  sure to wash under your fingernails as any plant resin there will continue to spread the rash. Do not rub skin vigorously when washing affected area. Poison ivy cannot spread if no oil from the plant remains on your body. A rash that has progressed to weeping sores will not spread the rash unless you have not washed thoroughly. It is also important to wash any clothes you have been wearing as these may carry active allergens. The rash will return if you wear the unwashed clothing, even several days later. Avoidance of the plant in the future is the best measure. Poison ivy plant can be recognized by the number of leaves. Generally, poison ivy has three leaves with flowering branches on a single stem. Diphenhydramine may be purchased over the counter and used as needed for itching. Do not drive with this medication if it makes you drowsy.Ask your caregiver about medication for children. SEEK MEDICAL CARE IF:  Open sores develop.  Redness spreads beyond area of rash.  You notice purulent (pus-like) discharge.  You have increased pain.  Other signs of infection develop (such as fever).   This information is not intended to replace advice given to you by your health care provider. Make sure you discuss any questions you have with your health care provider.   Document Released: 06/23/2000 Document Revised: 09/18/2011 Document Reviewed: 12/02/2014 Elsevier Interactive Patient Education [employer].

## 2015-11-23 ENCOUNTER — Other Ambulatory Visit: Payer: Self-pay | Admitting: Family Medicine

## 2015-11-24 LAB — CMP12+LP+TP+TSH+6AC+CBC/D/PLT
A/G RATIO: 1.9 (ref 1.2–2.2)
ALBUMIN: 5 g/dL (ref 3.5–5.5)
ALK PHOS: 67 IU/L (ref 39–117)
ALT: 28 IU/L (ref 0–32)
AST: 22 IU/L (ref 0–40)
BASOS ABS: 0 10*3/uL (ref 0.0–0.2)
BASOS: 0 %
BILIRUBIN TOTAL: 0.3 mg/dL (ref 0.0–1.2)
BUN / CREAT RATIO: 15 (ref 9–23)
BUN: 10 mg/dL (ref 6–24)
CHOL/HDL RATIO: 2.4 ratio (ref 0.0–4.4)
CREATININE: 0.65 mg/dL (ref 0.57–1.00)
Calcium: 10.1 mg/dL (ref 8.7–10.2)
Chloride: 96 mmol/L (ref 96–106)
Cholesterol, Total: 181 mg/dL (ref 100–199)
EOS (ABSOLUTE): 0.1 10*3/uL (ref 0.0–0.4)
EOS: 2 %
Free Thyroxine Index: 2.5 (ref 1.2–4.9)
GFR, EST AFRICAN AMERICAN: 119 mL/min/{1.73_m2} (ref >59)
GFR, EST NON AFRICAN AMERICAN: 103 mL/min/{1.73_m2} (ref >59)
GGT: 17 IU/L (ref 0–60)
GLOBULIN, TOTAL: 2.6 g/dL (ref 1.5–4.5)
Glucose: 123 mg/dL — ABNORMAL HIGH (ref 65–99)
HDL: 74 mg/dL (ref >39)
HEMATOCRIT: 43.5 % (ref 34.0–46.6)
HEMOGLOBIN: 14.9 g/dL (ref 11.1–15.9)
IMMATURE GRANULOCYTES: 1 %
IRON: 145 ug/dL (ref 27–159)
Immature Grans (Abs): 0.1 10*3/uL (ref 0.0–0.1)
LDH: 232 IU/L — ABNORMAL HIGH (ref 119–226)
LDL CALC: 82 mg/dL (ref 0–99)
Lymphocytes Absolute: 1.6 10*3/uL (ref 0.7–3.1)
Lymphs: 24 %
MCH: 30.7 pg (ref 26.6–33.0)
MCHC: 34.3 g/dL (ref 31.5–35.7)
MCV: 90 fL (ref 79–97)
MONOCYTES: 10 %
Monocytes Absolute: 0.7 10*3/uL (ref 0.1–0.9)
Neutrophils Absolute: 4.3 10*3/uL (ref 1.4–7.0)
Neutrophils: 63 %
PLATELETS: 270 10*3/uL (ref 150–379)
Phosphorus: 4.1 mg/dL (ref 2.5–4.5)
Potassium: 4.3 mmol/L (ref 3.5–5.2)
RBC: 4.85 x10E6/uL (ref 3.77–5.28)
RDW: 13.6 % (ref 12.3–15.4)
SODIUM: 139 mmol/L (ref 134–144)
T3 UPTAKE RATIO: 25 % (ref 24–39)
T4, Total: 9.9 ug/dL (ref 4.5–12.0)
TOTAL PROTEIN: 7.6 g/dL (ref 6.0–8.5)
TSH: 1.01 u[IU]/mL (ref 0.450–4.500)
Triglycerides: 123 mg/dL (ref 0–149)
Uric Acid: 4.9 mg/dL (ref 2.5–7.1)
VLDL CHOLESTEROL CAL: 25 mg/dL (ref 5–40)
WBC: 6.8 10*3/uL (ref 3.4–10.8)

## 2015-11-24 LAB — HGB A1C W/O EAG: HEMOGLOBIN A1C: 6.6 % — AB (ref 4.8–5.6)

## 2015-11-24 LAB — VITAMIN D 25 HYDROXY (VIT D DEFICIENCY, FRACTURES): Vit D, 25-Hydroxy: 35.7 ng/mL (ref 30.0–100.0)

## 2015-12-08 ENCOUNTER — Ambulatory Visit (INDEPENDENT_AMBULATORY_CARE_PROVIDER_SITE_OTHER): Payer: PRIVATE HEALTH INSURANCE | Admitting: Family Medicine

## 2015-12-08 VITALS — BP 128/84 | HR 78 | Temp 97.5°F | Resp 18 | Ht 71.0 in | Wt 221.0 lb

## 2015-12-08 DIAGNOSIS — R1084 Generalized abdominal pain: Secondary | ICD-10-CM | POA: Diagnosis not present

## 2015-12-08 DIAGNOSIS — J111 Influenza due to unidentified influenza virus with other respiratory manifestations: Secondary | ICD-10-CM | POA: Diagnosis not present

## 2015-12-08 DIAGNOSIS — R509 Fever, unspecified: Secondary | ICD-10-CM | POA: Diagnosis not present

## 2015-12-08 DIAGNOSIS — R197 Diarrhea, unspecified: Secondary | ICD-10-CM | POA: Diagnosis not present

## 2015-12-08 DIAGNOSIS — M791 Myalgia: Secondary | ICD-10-CM

## 2015-12-08 DIAGNOSIS — R070 Pain in throat: Secondary | ICD-10-CM | POA: Diagnosis not present

## 2015-12-08 LAB — POCT INFLUENZA A/B
INFLUENZA A, POC: POSITIVE — AB
INFLUENZA B, POC: NEGATIVE

## 2015-12-08 MED ORDER — OSELTAMIVIR PHOSPHATE 75 MG PO CAPS: 75.0000 mg | ORAL_CAPSULE | Freq: Two times a day (BID) | ORAL | Status: DC

## 2015-12-08 MED ORDER — HYDROCOD POLST-CPM POLST ER 10-8 MG/5ML PO SUER: 5.0000 mL | Freq: Two times a day (BID) | ORAL | Status: DC | PRN

## 2015-12-08 NOTE — Progress Notes (Signed)
Subjective:  This chart was scribed for [name] MD, by Tamsen Roers, at [facility].  This patient was seen in room 8 and the patient's care was started at 1:46 PM.   Chief Complaint  Patient presents with  . Fever  . Generalized Body Aches  . Diarrhea  . Abdominal Pain     Patient ID: Kelly Reed, female    DOB: 1966/04/07, 50 y.o.   MRN: [id number]  HPI HPI Comments: Kelly Reed is a 50 y.o. female who presents to the [facility] complaining of a fever, sore throat and body aches along with diarrhea/abdominal pain onset yesterday. She went to work and had to leave early as she was not feeling well due to her abdominal pain.  She woke up over the night (last night) and had a sore throat and fever.  She took tylenol, robitussin and ammonium but denies any complete relief.  She has had chicken noodle soup, oatmeal and Gatorade but everything else, she was not able to keep in.   Patients sugar was low yesterday and was not able to check them today.  She is still compliant with her Singulair and Metformin.  She is using her nasal sprays and inhaler as needed.   Her son was sick over the weekend with stuffiness and a cough.     Past Medical History  Diagnosis Date  . Diabetes mellitus type II   . NSVD (normal spontaneous vaginal delivery)     x 2  . Hyperlipidemia   . Anxiety     Current Outpatient Prescriptions on File Prior to Visit  Medication Sig Dispense Refill  . aspirin EC 81 MG tablet Take 1 tablet (81 mg total) by mouth daily.    Marland Kitchen atorvastatin (LIPITOR) 10 MG tablet Take 1 tablet (10 mg total) by mouth daily. 90 tablet 3  . Azelastine HCl 0.15 % SOLN Place 1 spray into the nose 2 (two) times daily. 30 mL 11  . cyclobenzaprine (FLEXERIL) 5 MG tablet Reported on 11/12/2015    . ferrous sulfate 325 (65 FE) MG tablet Take 325 mg by mouth at bedtime.      . hydrOXYzine (ATARAX/VISTARIL) 25 MG tablet Take 1 tablet (25 mg  total) by mouth every 4 (four) hours as needed for itching. 60 tablet 0  . levocetirizine (XYZAL) 5 MG tablet Take 1 tablet (5 mg total) by mouth every evening. 30 tablet 11  . lisinopril (PRINIVIL,ZESTRIL) 10 MG tablet TAKE 1 TABLET BY MOUTH EVERY NIGHT AT BEDTIME 90 tablet 3  . MAGNESIUM PO Take by mouth.    . metFORMIN (GLUCOPHAGE) 1000 MG tablet Take 1 tablet (1,000 mg total) by mouth 2 (two) times daily with a meal. 180 tablet 1  . mometasone (NASONEX) 50 MCG/ACT nasal spray Place 2 sprays into the nose daily. 17 g 12  . montelukast (SINGULAIR) 10 MG tablet Take 1 tablet (10 mg total) by mouth at bedtime. 90 tablet 3  . Multiple Vitamins-Minerals (CENTRUM ADULTS PO) Take by mouth.    . traZODone (DESYREL) 50 MG tablet Take 1 tablet (50 mg total) by mouth at bedtime. 90 tablet 1  . triamcinolone (KENALOG) 0.025 % ointment Apply 1 application topically 4 (four) times daily. For 10 days to poison ivy 454 g 0  . VENTOLIN HFA 108 (90 Base) MCG/ACT inhaler INHALE 2 PUFFS BY MOUTH INTO THE LUNGS EVERY 6 HOURS AS NEEDED WHEEZING OR SHORTNESS OF BREATH 18  g 0  . Casanthranol-Docusate Sodium 30-100 MG CAPS Take by mouth at bedtime. Reported on 12/08/2015     No current facility-administered medications on file prior to visit.    Allergies  Allergen Reactions  . Adhesive [Tape] Rash and Other (See Comments)    Peeling of skin  . Prednisone     Hyperglycemia with oral steroids      Review of Systems  Constitutional: Positive for fever.  HENT: Positive for sinus pressure and sore throat.   Eyes: Negative for pain, redness and itching.  Gastrointestinal: Positive for abdominal pain and diarrhea.  Musculoskeletal: Positive for myalgias. Negative for neck pain and neck stiffness.  Neurological: Negative for seizures, syncope and speech difficulty.       Objective:   Physical Exam  Constitutional: She is oriented to person, place, and time. She appears well-developed and well-nourished. No  distress.  HENT:  Head: Normocephalic and atraumatic.  Small mid ear effusion bilaterally.  Nares swollen shut with some erythema.  2+ tonsillar edema.  No exudate.   Eyes: Pupils are equal, round, and reactive to light.  Neck: No thyromegaly present.  Some anterior cervical adenopathy.    Cardiovascular: Normal rate, regular rhythm, S1 normal, S2 normal and normal heart sounds.  Exam reveals no gallop and no friction rub.   No murmur heard. Pulmonary/Chest: Effort normal. No respiratory distress.  Abdominal: Soft. Bowel sounds are normal. She exhibits no distension. There is no tenderness.  Musculoskeletal: Normal range of motion.  Neurological: She is alert and oriented to person, place, and time.  Skin: Skin is warm and dry.  Psychiatric: She has a normal mood and affect. Her behavior is normal.   Filed Vitals:   12/08/15 1331  BP: 128/84  Pulse: 78  Temp: 97.5 F (36.4 C)  TempSrc: Oral  Resp: 18  Height: 5\' 11"  (1.803 m)  Weight: 221 lb (100.245 kg)  SpO2: 99%   Results for orders placed or performed in visit on 12/08/15  POCT Influenza A/B  Result Value Ref Range   Influenza A, POC Positive (A) Negative   Influenza B, POC Negative Negative        Assessment & Plan:   1. Fever, unspecified   2. Influenza with respiratory manifestation     Orders Placed This Encounter  Procedures  . POCT Influenza A/B    Meds ordered this encounter  Medications  . chlorpheniramine-HYDROcodone (TUSSIONEX PENNKINETIC ER) 10-8 MG/5ML SUER    Sig: Take 5 mLs by mouth every 12 (twelve) hours as needed.    Dispense:  120 mL    Refill:  0  . oseltamivir (TAMIFLU) 75 MG capsule    Sig: Take 1 capsule (75 mg total) by mouth 2 (two) times daily.    Dispense:  10 capsule    Refill:  0    I personally performed the services described in this documentation, which was scribed in my presence. The recorded information has been reviewed and considered, and addended by me as needed.     [name], M.D.  [facility] [address] [place], [place] [address] [phone] phone [phone] fax  12/13/2015 10:33 PM

## 2015-12-08 NOTE — Patient Instructions (Addendum)
IF you received an x-ray today, you will receive an invoice from [facility]. Please contact [facility]iology at [phone] with questions or concerns regarding your invoice.   IF you received labwork today, you will receive an invoice from [employer]. Please contact [facility] at [phone] with questions or concerns regarding your invoice.   Our billing staff will not be able to assist you with questions regarding bills from these companies.  You will be contacted with the lab results as soon as they are available. The fastest way to get your results is to activate your My Chart account. Instructions are located on the last page of this paperwork. If you have not heard from [place] regarding the results in 2 weeks, please contact this office.     We recommend that you schedule a mammogram for breast cancer screening. Typically, you do not need a referral to do this. Please contact a local imaging center to schedule your mammogram.  [facility] - [phone]  *ask for the Radiology Department [facility] ([place]) - [phone] or [phone]  [facility] - [phone] [place] [phone] [facility] - [phone]  *ask for the [facility] - [phone]  *ask for the Radiology Department [facility] - [phone]  *ask for the [place] - [phone]   Influenza, Adult Influenza ("the flu") is a viral infection of the respiratory tract. It occurs more often in winter months because people spend more time in close contact with one another. Influenza can make you feel very sick. Influenza easily spreads from person to person (contagious). CAUSES  Influenza is caused by a virus that infects the respiratory tract. You can catch the virus by breathing in droplets from an infected person's  cough or sneeze. You can also catch the virus by touching something that was recently contaminated with the virus and then touching your mouth, nose, or eyes. RISKS AND COMPLICATIONS You may be at risk for a more severe case of influenza if you smoke cigarettes, have diabetes, have chronic heart disease (such as heart failure) or lung disease (such as asthma), or if you have a weakened immune system. Elderly people and pregnant women are also at risk for more serious infections. The most common problem of influenza is a lung infection (pneumonia). Sometimes, this problem can require emergency medical care and may be life threatening. SIGNS AND SYMPTOMS  Symptoms typically last 4 to 10 days and may include:  Fever.  Chills.  Headache, body aches, and muscle aches.  Sore throat.  Chest discomfort and cough.  Poor appetite.  Weakness or feeling tired.  Dizziness.  Nausea or vomiting. DIAGNOSIS  Diagnosis of influenza is often made based on your history and a physical exam. A nose or throat swab test can be done to confirm the diagnosis. TREATMENT  In mild cases, influenza goes away on its own. Treatment is directed at relieving symptoms. For more severe cases, your health care provider may prescribe antiviral medicines to shorten the sickness. Antibiotic medicines are not effective because the infection is caused by a virus, not by bacteria. HOME CARE INSTRUCTIONS  Take medicines only as directed by your health care provider.  Use a cool mist humidifier to make breathing easier.  Get plenty of rest until your temperature returns to normal. This usually takes 3 to 4 days.  Drink enough fluid to  keep your urine clear or pale yellow.  Cover yourmouth and nosewhen coughing or sneezing,and wash your handswellto prevent thevirusfrom spreading.  Stay homefromwork orschool untilthe fever is gonefor at least 35full day. PREVENTION  An annual influenza vaccination (flu shot)  is the best way to avoid getting influenza. An annual flu shot is now routinely recommended for all adults in the [place] IF:  You experiencechest pain, yourcough worsens,or you producemore mucus.  Youhave nausea,vomiting, ordiarrhea.  Your fever returns or gets worse. SEEK IMMEDIATE MEDICAL CARE IF:  You havetrouble breathing, you become short of breath,or your skin ornails becomebluish.  You have severe painor stiffnessin the neck.  You develop a sudden headache, or pain in the face or ear.  You have nausea or vomiting that you cannot control. MAKE SURE YOU:   Understand these instructions.  Will watch your condition.  Will get help right away if you are not doing well or get worse.   This information is not intended to replace advice given to you by your health care provider. Make sure you discuss any questions you have with your health care provider.   Document Released: 06/23/2000 Document Revised: 07/17/2014 Document Reviewed: 09/25/2011 Elsevier Interactive Patient Education [employer].

## 2016-01-27 ENCOUNTER — Other Ambulatory Visit: Payer: Self-pay | Admitting: Family Medicine

## 2016-03-16 ENCOUNTER — Ambulatory Visit (INDEPENDENT_AMBULATORY_CARE_PROVIDER_SITE_OTHER): Payer: PRIVATE HEALTH INSURANCE | Admitting: Family Medicine

## 2016-03-16 VITALS — BP 142/90 | HR 96 | Temp 98.1°F | Resp 17 | Ht 71.0 in | Wt 224.0 lb

## 2016-03-16 DIAGNOSIS — J45901 Unspecified asthma with (acute) exacerbation: Secondary | ICD-10-CM

## 2016-03-16 MED ORDER — METHYLPREDNISOLONE ACETATE 80 MG/ML IJ SUSP
80.0000 mg | Freq: Once | INTRAMUSCULAR | Status: AC
  Administered 2016-03-16: 80 mg via INTRAMUSCULAR

## 2016-03-16 MED ORDER — AZITHROMYCIN 250 MG PO TABS: ORAL_TABLET | ORAL | 0 refills | Status: DC

## 2016-03-16 NOTE — Patient Instructions (Addendum)
It was good to meet you today!  I have sent in the azithromycin for your asthma exacerbation.  Use the albuterol every 6 hours or so through the weekend, then you can move back to as needed.   Steroid shot here.  If you're not feeling better, come back and see [place] this weekend.  If you're feeling worse, don't wait.  Otherwise keep your regularly scheduled appt with Dr. Brigitte Pulse next week.    IF you received an x-ray today, you will receive an invoice from [facility]. Please contact [facility] Radiology at [phone] with questions or concerns regarding your invoice.   IF you received labwork today, you will receive an invoice from [employer]. Please contact [facility] at [phone] with questions or concerns regarding your invoice.   Our billing staff will not be able to assist you with questions regarding bills from these companies.  You will be contacted with the lab results as soon as they are available. The fastest way to get your results is to activate your My Chart account. Instructions are located on the last page of this paperwork. If you have not heard from [place] regarding the results in 2 weeks, please contact this office.

## 2016-03-16 NOTE — Progress Notes (Signed)
SUBJECTIVE: URI symptoms:  Kelly Reed is a 50 y.o. female who complains of URI symptoms present for past several days.  Describes rhinorrhea, sinus congestion, cough.  Has tried usual allergy medications without relief.  Sick contacts are none that she knows of.  No fevers or chills. No nausea or vomiting.  Denies smoking cigarettes.  Does have personal history of asthma.  States she's been having increasing coughing, worse at night since her recent illness. Dry hacking cough that keeps her awake at night.  She does have an inhaler but has not really been using this. States she last used this on Monday. Did not think to use this with her coughing. Subjectively warm but no right fevers or chills that she knows of. No nausea vomiting.  OBJECTIVE: BP (!) 142/90 (BP Location: Right Arm, Patient Position: Sitting, Cuff Size: Normal)   Pulse 96   Temp 98.1 F (36.7 C) (Oral)   Resp 17   Ht 5\' 11"  (1.803 m)   Wt 224 lb (101.6 kg)   SpO2 97%   BMI 31.24 kg/m  Gen:  Patient sitting on exam table, appears stated age in no acute distress Head: Normocephalic atraumatic Eyes: EOMI, PERRL, sclera and conjunctiva non-erythematous Ears:  Canals clear bilaterally.  TMs pearly gray bilaterally without erythema or bulging.   Nose:  Nasal turbinates grossly enlarged bilaterally. Some exudates noted. Tender to palpation of maxillary sinus  Mouth: Mucosa membranes moist. Tonsils +2, nonenlarged, non-erythematous.  Throat and pharynx normal appearing. Neck: No cervical lymphadenopathy noted Heart:  RRR, no murmurs auscultated. Pulm:  Wheezing BL lower lung fields.    Imp/Plan: 1. Asthma exacerbation:   - Treated with Depo-Medrol here. -Also treated with azithromycin for the next 5 days. -See instructions for albuterol use. -She has follow-up with her PCP next week. That will be a good time follow-up to ensure that her asthma is improving.  2.  URI:  -  This likely triggered #1 above. -Symptomatic  relief, there she is receiving antibiotics for her asthma exacerbation.

## 2016-03-22 NOTE — Progress Notes (Addendum)
Subjective:    Patient ID: Kelly Reed, female    DOB: [birth year], 50 y.o.   MRN: [id number]  HPI Kelly Reed is a 50 y.o. female who presents to the [facility] for 6 month reevaluation of her chronic medical conditions.   DM: On metformin 1000mg  BID. Hemoglobin A1C has been stable at 6.3 for the past year. Increased to 6.6 four months prior. Checks her CBGs regularly. Pt takes 81 mg aspirin and 10 mg lisinopril daily. Pt's last foot exam was done on September 16, 2015 and it was normal. As was micro albumin same date. LDL 73 with non HDL of 106 at that time.   Optho exam at [facility] annually - has appt in 2 wks.  HLD: On Lipitor 10mg . At goal.   Iron deficiency anemia: CBC normal over that last year. Ferratin 18 nine months prior.  Is taking one tab/d of iron supp  Insomnia with leg cramps: Takes magnesium, flexeril and trazodone.   Seasonal allergies and asthma: Seen for flare by colleague one week prior. Treated with Z-pack, DepoMedrol IM, and scheduled albuterol. She tried the albuterol but does not feel like it is helping.  She is having a lot of allergies and her asthma is still flared up.  Her cough is productive of yellow mucous. She is feeling very ShoB with laryngitis at rest.  She is now having itchy burning eyes that are red and swollen and draining yellow mucous. No otc allergy eye drops help.  She used some Tussionex she had at home from prior illness and that didn't even help the cough and no otc meds touch it.  She reports she initially did feel a little better 2-3d after the DepoMedrol inj but then on day 4 all sxs returned worse.  She is having night sweats, hot flashes, and fevers.  No other antibiotics other than zpack.  Pt due for tetanus shot, receives flu shots at work.  It has been a while since her last tetanus pt admits   Patient Active Problem List   Diagnosis Date Noted  . Anemia, iron deficiency 08/07/2014  . Allergic  reaction to chemical substance 08/07/2014  . Muscle cramp, nocturnal 08/07/2014  . Essential hypertension, benign 12/07/2012  . Polypharmacy 12/07/2012  . Ganglion cyst 10/22/2012  . Routine general medical examination at a health care facility 03/29/2012  . Transaminitis 03/29/2012  . Vertigo 06/09/2011  . Hypercholesteremia 05/04/2011  . Anxiety and depression 02/01/2011  . SYNCOPE 12/27/2009  . EDEMA 03/04/2009  . Allergic state 11/16/2006  . Diabetes mellitus, type II ([place]) 10/25/2006    Current Outpatient Prescriptions on File Prior to Visit  Medication Sig Dispense Refill  . aspirin EC 81 MG tablet Take 1 tablet (81 mg total) by mouth daily.    Marland Kitchen atorvastatin (LIPITOR) 10 MG tablet Take 1 tablet (10 mg total) by mouth daily. 90 tablet 3  . Azelastine HCl 0.15 % SOLN Place 1 spray into the nose 2 (two) times daily. 30 mL 11  . azithromycin (ZITHROMAX) 250 MG tablet Take 2 tabs today and 1 tab daily after that 6 tablet 0  . Casanthranol-Docusate Sodium 30-100 MG CAPS Take by mouth at bedtime. Reported on 12/08/2015    . chlorpheniramine-HYDROcodone (TUSSIONEX PENNKINETIC ER) 10-8 MG/5ML SUER Take 5 mLs by mouth every 12 (twelve) hours as needed. 120 mL 0  . cyclobenzaprine (FLEXERIL) 5 MG tablet Reported on 11/12/2015    . ferrous  sulfate 325 (65 FE) MG tablet Take 325 mg by mouth at bedtime.      Marland Kitchen levocetirizine (XYZAL) 5 MG tablet Take 1 tablet (5 mg total) by mouth every evening. 30 tablet 11  . lisinopril (PRINIVIL,ZESTRIL) 10 MG tablet TAKE 1 TABLET BY MOUTH EVERY NIGHT AT BEDTIME 90 tablet 3  . MAGNESIUM PO Take by mouth.    . metFORMIN (GLUCOPHAGE) 1000 MG tablet TAKE 1 TABLET(1000 MG) BY MOUTH TWICE DAILY WITH A MEAL 180 tablet 0  . mometasone (NASONEX) 50 MCG/ACT nasal spray Place 2 sprays into the nose daily. 17 g 12  . montelukast (SINGULAIR) 10 MG tablet Take 1 tablet (10 mg total) by mouth at bedtime. 90 tablet 3  . Multiple Vitamins-Minerals (CENTRUM ADULTS PO) Take by  mouth.    . Pseudoephedrine-Ibuprofen 30-200 MG TABS Take by mouth.    . traZODone (DESYREL) 50 MG tablet Take 1 tablet (50 mg total) by mouth at bedtime. 90 tablet 1  . VENTOLIN HFA 108 (90 Base) MCG/ACT inhaler INHALE 2 PUFFS BY MOUTH INTO THE LUNGS EVERY 6 HOURS AS NEEDED WHEEZING OR SHORTNESS OF BREATH 18 g 0   No current facility-administered medications on file prior to visit.     Allergies  Allergen Reactions  . Adhesive [Tape] Rash and Other (See Comments)    Peeling of skin  . Prednisone     Hyperglycemia with oral steroids    Depression screen [facility] 2/9 03/16/2016 12/08/2015 11/12/2015 09/16/2015 09/06/2015  Decreased Interest 0 0 0 0 0  Down, Depressed, Hopeless 0 0 0 0 0  PHQ - 2 Score 0 0 0 0 0       Review of Systems  Constitutional: Positive for fatigue. Negative for activity change, appetite change, chills, diaphoresis, fever and unexpected weight change.  HENT: Positive for congestion, ear pain, facial swelling, postnasal drip, rhinorrhea, sinus pressure and voice change. Negative for ear discharge, hearing loss, nosebleeds, sore throat and trouble swallowing.   Eyes: Positive for pain and redness. Negative for discharge and itching.  Respiratory: Positive for cough, chest tightness and shortness of breath.   Cardiovascular: Negative for chest pain and leg swelling.  Gastrointestinal: Negative for abdominal pain, nausea and vomiting.  Musculoskeletal: Negative for gait problem, neck pain and neck stiffness.  Skin: Negative for rash.  Allergic/Immunologic: Positive for environmental allergies. Negative for immunocompromised state.  Neurological: Positive for light-headedness and headaches. Negative for syncope, weakness and numbness.  Hematological: Positive for adenopathy.  Psychiatric/Behavioral: Positive for sleep disturbance.       Objective:   Physical Exam  Constitutional: She is oriented to person, place, and time. She appears well-developed and well-nourished.  She appears lethargic. She appears ill. No distress.  HENT:  Head: Normocephalic and atraumatic.  Right Ear: Tympanic membrane, external ear and ear canal normal. Tympanic membrane is not retracted. No middle ear effusion.  Left Ear: Tympanic membrane, external ear and ear canal normal. Tympanic membrane is not retracted.  No middle ear effusion.  Nose: Mucosal edema present. No rhinorrhea. Right sinus exhibits maxillary sinus tenderness and frontal sinus tenderness. Left sinus exhibits maxillary sinus tenderness and frontal sinus tenderness.  Mouth/Throat: Uvula is midline and mucous membranes are normal. No oropharyngeal exudate, posterior oropharyngeal edema, posterior oropharyngeal erythema or tonsillar abscesses.  Eyes: Conjunctivae are normal. Right eye exhibits no discharge. Left eye exhibits no discharge. No scleral icterus.  Neck: Normal range of motion. Neck supple.  Cardiovascular: Normal rate, regular rhythm, normal heart sounds and intact  distal pulses.   Pulmonary/Chest: Effort normal and breath sounds normal.  Lymphadenopathy:       Head (right side): Submandibular adenopathy present. No preauricular and no posterior auricular adenopathy present.       Head (left side): Submandibular adenopathy present. No preauricular and no posterior auricular adenopathy present.    She has no cervical adenopathy.       Right: No supraclavicular adenopathy present.       Left: No supraclavicular adenopathy present.  Neurological: She is oriented to person, place, and time. She appears lethargic.  Skin: Skin is warm and dry. She is not diaphoretic. No erythema.  Psychiatric: She has a normal mood and affect. Her behavior is normal.   No sig improvement in sxs or exam after neb - coughing more.  BP 124/84   Pulse 82   Temp 98.4 F (36.9 C) (Oral)   Resp 18   Ht 5\' 11"  (1.803 m)   Wt 223 lb (101.2 kg)   SpO2 99%   BMI 31.10 kg/m    Results for orders placed or performed in visit on  03/23/16  POCT glycosylated hemoglobin (Hb A1C)  Result Value Ref Range   Hemoglobin A1C 6.3   POCT CBC  Result Value Ref Range   WBC 7.3 4.6 - 10.2 K/uL   Lymph, poc 1.7 0.6 - 3.4   POC LYMPH PERCENT 22.7 10 - 50 %L   MID (cbc) 0.7 0 - 0.9   POC MID % 9.4 0 - 12 %M   POC Granulocyte 5.0 2 - 6.9   Granulocyte percent 67.9 37 - 80 %G   RBC 4.36 4.04 - 5.48 M/uL   Hemoglobin 13.9 12.2 - 16.2 g/dL   HCT, POC 39.1 37.7 - 47.9 %   MCV 89.6 80 - 97 fL   MCH, POC 31.8 (A) 27 - 31.2 pg   MCHC 35.5 (A) 31.8 - 35.4 g/dL   RDW, POC 13.2 %   Platelet Count, POC 237 142 - 424 K/uL   MPV 8.1 0 - 99.8 fL       Assessment & Plan:   1. Type 2 diabetes mellitus without complication, without long-term current use of insulin ([place]) - well controlled  2. Essential hypertension, benign   3. Hypercholesteremia   4. Polypharmacy   5. Anemia, iron deficiency   6. Screening for STD (sexually transmitted disease)   7. Acute sinusitis, recurrence not specified, unspecified location   8. Asthma with acute exacerbation, mild intermittent    Refilled all meds - no changes. F/u in 6 mos.  Orders Placed This Encounter  Procedures  . Ferritin  . Comprehensive metabolic panel  . HIV antibody  . Hepatitis C Antibody  . POCT glycosylated hemoglobin (Hb A1C)  . POCT CBC    Meds ordered this encounter  Medications  . albuterol (PROVENTIL) (2.5 MG/3ML) 0.083% nebulizer solution 2.5 mg  . chlorpheniramine-HYDROcodone (TUSSIONEX PENNKINETIC ER) 10-8 MG/5ML SUER    Sig: Take 5 mLs by mouth every 12 (twelve) hours as needed.    Dispense:  120 mL    Refill:  0  . predniSONE (DELTASONE) 20 MG tablet    Sig: Take 3 tabs qd x 3d, then 2 tabs qd x 3d then 1 tab qd x 3d.    Dispense:  18 tablet    Refill:  0  . amoxicillin-clavulanate (AUGMENTIN) 875-125 MG tablet    Sig: Take 1 tablet by mouth 2 (two) times daily.    Dispense:  20 tablet    Refill:  0      [name], M.D.  [facility] [address] [place], [place] [address] [phone] phone [phone] fax  03/23/16 11:12 PM

## 2016-03-23 ENCOUNTER — Ambulatory Visit (INDEPENDENT_AMBULATORY_CARE_PROVIDER_SITE_OTHER): Payer: PRIVATE HEALTH INSURANCE | Admitting: Family Medicine

## 2016-03-23 ENCOUNTER — Encounter: Payer: Self-pay | Admitting: Family Medicine

## 2016-03-23 VITALS — BP 124/84 | HR 82 | Temp 98.4°F | Resp 18 | Ht 71.0 in | Wt 223.0 lb

## 2016-03-23 DIAGNOSIS — E7800 Pure hypercholesterolemia, unspecified: Secondary | ICD-10-CM | POA: Diagnosis not present

## 2016-03-23 DIAGNOSIS — D509 Iron deficiency anemia, unspecified: Secondary | ICD-10-CM

## 2016-03-23 DIAGNOSIS — I10 Essential (primary) hypertension: Secondary | ICD-10-CM

## 2016-03-23 DIAGNOSIS — J4521 Mild intermittent asthma with (acute) exacerbation: Secondary | ICD-10-CM

## 2016-03-23 DIAGNOSIS — J019 Acute sinusitis, unspecified: Secondary | ICD-10-CM

## 2016-03-23 DIAGNOSIS — E119 Type 2 diabetes mellitus without complications: Secondary | ICD-10-CM | POA: Diagnosis not present

## 2016-03-23 DIAGNOSIS — Z113 Encounter for screening for infections with a predominantly sexual mode of transmission: Secondary | ICD-10-CM | POA: Diagnosis not present

## 2016-03-23 DIAGNOSIS — Z79899 Other long term (current) drug therapy: Secondary | ICD-10-CM | POA: Diagnosis not present

## 2016-03-23 LAB — POCT CBC
Granulocyte percent: 67.9 %G (ref 37–80)
HCT, POC: 39.1 % (ref 37.7–47.9)
Hemoglobin: 13.9 g/dL (ref 12.2–16.2)
Lymph, poc: 1.7 (ref 0.6–3.4)
MCH, POC: 31.8 pg — AB (ref 27–31.2)
MCHC: 35.5 g/dL — AB (ref 31.8–35.4)
MCV: 89.6 fL (ref 80–97)
MID (CBC): 0.7 (ref 0–0.9)
MPV: 8.1 fL (ref 0–99.8)
PLATELET COUNT, POC: 237 10*3/uL (ref 142–424)
POC Granulocyte: 5 (ref 2–6.9)
POC LYMPH %: 22.7 % (ref 10–50)
POC MID %: 9.4 %M (ref 0–12)
RBC: 4.36 M/uL (ref 4.04–5.48)
RDW, POC: 13.2 %
WBC: 7.3 10*3/uL (ref 4.6–10.2)

## 2016-03-23 LAB — COMPREHENSIVE METABOLIC PANEL
ALBUMIN: 4.5 g/dL (ref 3.6–5.1)
ALK PHOS: 58 U/L (ref 33–130)
ALT: 26 U/L (ref 6–29)
AST: 19 U/L (ref 10–35)
BILIRUBIN TOTAL: 0.5 mg/dL (ref 0.2–1.2)
BUN: 13 mg/dL (ref 7–25)
CALCIUM: 9.4 mg/dL (ref 8.6–10.4)
CO2: 25 mmol/L (ref 20–31)
Chloride: 105 mmol/L (ref 98–110)
Creat: 0.6 mg/dL (ref 0.50–1.05)
Glucose, Bld: 139 mg/dL — ABNORMAL HIGH (ref 65–99)
POTASSIUM: 4.2 mmol/L (ref 3.5–5.3)
Sodium: 140 mmol/L (ref 135–146)
Total Protein: 6.7 g/dL (ref 6.1–8.1)

## 2016-03-23 LAB — POCT GLYCOSYLATED HEMOGLOBIN (HGB A1C): Hemoglobin A1C: 6.3

## 2016-03-23 LAB — HEPATITIS C ANTIBODY: HCV AB: NEGATIVE

## 2016-03-23 LAB — FERRITIN: Ferritin: 41 ng/mL (ref 10–232)

## 2016-03-23 LAB — HIV ANTIBODY (ROUTINE TESTING W REFLEX): HIV: NONREACTIVE

## 2016-03-23 MED ORDER — AMOXICILLIN-POT CLAVULANATE 875-125 MG PO TABS: 1.0000 | ORAL_TABLET | Freq: Two times a day (BID) | ORAL | 0 refills | Status: DC

## 2016-03-23 MED ORDER — TRAZODONE HCL 50 MG PO TABS: 50.0000 mg | ORAL_TABLET | Freq: Every day | ORAL | 3 refills | Status: DC

## 2016-03-23 MED ORDER — LISINOPRIL 10 MG PO TABS: ORAL_TABLET | ORAL | 3 refills | Status: DC

## 2016-03-23 MED ORDER — METFORMIN HCL 1000 MG PO TABS: ORAL_TABLET | ORAL | 3 refills | Status: DC

## 2016-03-23 MED ORDER — PREDNISONE 20 MG PO TABS: ORAL_TABLET | ORAL | 0 refills | Status: DC

## 2016-03-23 MED ORDER — MOMETASONE FUROATE 50 MCG/ACT NA SUSP: 2.0000 | Freq: Every day | NASAL | 12 refills | Status: DC

## 2016-03-23 MED ORDER — HYDROCOD POLST-CPM POLST ER 10-8 MG/5ML PO SUER: 5.0000 mL | Freq: Two times a day (BID) | ORAL | 0 refills | Status: DC | PRN

## 2016-03-23 MED ORDER — ALBUTEROL SULFATE (2.5 MG/3ML) 0.083% IN NEBU
2.5000 mg | INHALATION_SOLUTION | Freq: Once | RESPIRATORY_TRACT | Status: AC
  Administered 2016-03-23: 2.5 mg via RESPIRATORY_TRACT

## 2016-03-23 MED ORDER — LEVOCETIRIZINE DIHYDROCHLORIDE 5 MG PO TABS: 5.0000 mg | ORAL_TABLET | Freq: Every evening | ORAL | 11 refills | Status: DC

## 2016-03-23 MED ORDER — ALBUTEROL SULFATE HFA 108 (90 BASE) MCG/ACT IN AERS: INHALATION_SPRAY | RESPIRATORY_TRACT | 5 refills | Status: DC

## 2016-03-23 NOTE — Patient Instructions (Addendum)
Meds ordered this encounter  Medications  . albuterol (PROVENTIL) (2.5 MG/3ML) 0.083% nebulizer solution 2.5 mg  . chlorpheniramine-HYDROcodone (TUSSIONEX PENNKINETIC ER) 10-8 MG/5ML SUER    Sig: Take 5 mLs by mouth every 12 (twelve) hours as needed.    Dispense:  120 mL    Refill:  0  . predniSONE (DELTASONE) 20 MG tablet    Sig: Take 3 tabs qd x 3d, then 2 tabs qd x 3d then 1 tab qd x 3d.    Dispense:  18 tablet    Refill:  0  . amoxicillin-clavulanate (AUGMENTIN) 875-125 MG tablet    Sig: Take 1 tablet by mouth 2 (two) times daily.    Dispense:  20 tablet    Refill:  0       IF you received an x-ray today, you will receive an invoice from [facility]. Please contact [facility] at [phone] with questions or concerns regarding your invoice.   IF you received labwork today, you will receive an invoice from [employer]. Please contact [facility] at [phone] with questions or concerns regarding your invoice.   Our billing staff will not be able to assist you with questions regarding bills from these companies.  You will be contacted with the lab results as soon as they are available. The fastest way to get your results is to activate your My Chart account. Instructions are located on the last page of this paperwork. If you have not heard from [place] regarding the results in 2 weeks, please contact this office.     Sinusitis, Adult Sinusitis is redness, soreness, and inflammation of the paranasal sinuses. Paranasal sinuses are air pockets within the bones of your face. They are located beneath your eyes, in the middle of your forehead, and above your eyes. In healthy paranasal sinuses, mucus is able to drain out, and air is able to circulate through them by way of your nose. However, when your paranasal sinuses are inflamed, mucus and air can become trapped. This can allow bacteria and other germs to grow and cause infection. Sinusitis can  develop quickly and last only a short time (acute) or continue over a long period (chronic). Sinusitis that lasts for more than 12 weeks is considered chronic. CAUSES Causes of sinusitis include:  Allergies.  Structural abnormalities, such as displacement of the cartilage that separates your nostrils (deviated septum), which can decrease the air flow through your nose and sinuses and affect sinus drainage.  Functional abnormalities, such as when the small hairs (cilia) that line your sinuses and help remove mucus do not work properly or are not present. SIGNS AND SYMPTOMS Symptoms of acute and chronic sinusitis are the same. The primary symptoms are pain and pressure around the affected sinuses. Other symptoms include:  Upper toothache.  Earache.  Headache.  Bad breath.  Decreased sense of smell and taste.  A cough, which worsens when you are lying flat.  Fatigue.  Fever.  Thick drainage from your nose, which often is green and may contain pus (purulent).  Swelling and warmth over the affected sinuses. DIAGNOSIS Your health care provider will perform a physical exam. During your exam, your health care provider may perform any of the following to help determine if you have acute sinusitis or chronic sinusitis:  Look in your nose for signs of abnormal growths in your nostrils (nasal polyps).  Tap over the affected sinus to check for signs of infection.  View the inside of your sinuses using an  imaging device that has a light attached (endoscope). If your health care provider suspects that you have chronic sinusitis, one or more of the following tests may be recommended:  Allergy tests.  Nasal culture. A sample of mucus is taken from your nose, sent to a lab, and screened for bacteria.  Nasal cytology. A sample of mucus is taken from your nose and examined by your health care provider to determine if your sinusitis is related to an allergy. TREATMENT Most cases of acute  sinusitis are related to a viral infection and will resolve on their own within 10 days. Sometimes, medicines are prescribed to help relieve symptoms of both acute and chronic sinusitis. These may include pain medicines, decongestants, nasal steroid sprays, or saline sprays. However, for sinusitis related to a bacterial infection, your health care provider will prescribe antibiotic medicines. These are medicines that will help kill the bacteria causing the infection. Rarely, sinusitis is caused by a fungal infection. In these cases, your health care provider will prescribe antifungal medicine. For some cases of chronic sinusitis, surgery is needed. Generally, these are cases in which sinusitis recurs more than 3 times per year, despite other treatments. HOME CARE INSTRUCTIONS  Drink plenty of water. Water helps thin the mucus so your sinuses can drain more easily.  Use a humidifier.  Inhale steam 3-4 times a day (for example, sit in the bathroom with the shower running).  Apply a warm, moist washcloth to your face 3-4 times a day, or as directed by your health care provider.  Use saline nasal sprays to help moisten and clean your sinuses.  Take medicines only as directed by your health care provider.  If you were prescribed either an antibiotic or antifungal medicine, finish it all even if you start to feel better. SEEK IMMEDIATE MEDICAL CARE IF:  You have increasing pain or severe headaches.  You have nausea, vomiting, or drowsiness.  You have swelling around your face.  You have vision problems.  You have a stiff neck.  You have difficulty breathing.   This information is not intended to replace advice given to you by your health care provider. Make sure you discuss any questions you have with your health care provider.   Document Released: 06/26/2005 Document Revised: 07/17/2014 Document Reviewed: 07/11/2011 Elsevier Interactive Patient Education [employer].

## 2016-04-27 ENCOUNTER — Other Ambulatory Visit: Payer: Self-pay | Admitting: Family Medicine

## 2016-05-22 ENCOUNTER — Telehealth: Payer: Self-pay

## 2016-05-22 NOTE — Telephone Encounter (Signed)
Patient needs her FMLA forms updated from last year, I have copied the forms just as they were last year if you think something needs to be changed just let me know. I highlighted the areas that need to be completed and  I will place the forms in your box on 05/22/16 if you could please return them to the FMLA/Disability box at the [employer] checkout desk within 5-7 business days. Thank you!

## 2016-05-24 NOTE — Telephone Encounter (Signed)
done

## 2016-05-26 DIAGNOSIS — Z0271 Encounter for disability determination: Secondary | ICD-10-CM

## 2016-05-29 NOTE — Telephone Encounter (Signed)
Paperwork scanned and faxed to company on 05/26/16

## 2016-06-14 ENCOUNTER — Ambulatory Visit (INDEPENDENT_AMBULATORY_CARE_PROVIDER_SITE_OTHER): Payer: PRIVATE HEALTH INSURANCE | Admitting: Physician Assistant

## 2016-06-14 VITALS — BP 152/94 | HR 93 | Temp 98.2°F | Resp 18 | Ht 70.5 in | Wt 221.0 lb

## 2016-06-14 DIAGNOSIS — J3489 Other specified disorders of nose and nasal sinuses: Secondary | ICD-10-CM | POA: Diagnosis not present

## 2016-06-14 DIAGNOSIS — R0602 Shortness of breath: Secondary | ICD-10-CM

## 2016-06-14 MED ORDER — PSEUDOEPHEDRINE HCL 60 MG PO TABS: 60.0000 mg | ORAL_TABLET | Freq: Four times a day (QID) | ORAL | 0 refills | Status: AC | PRN

## 2016-06-14 NOTE — Patient Instructions (Addendum)
You can use the pseudoephedrine for the congestion. You can try a nasal rinse at home. Continue to take your allergy medications.   Allergic Rhinitis Allergic rhinitis is when the mucous membranes in the nose respond to allergens. Allergens are particles in the air that cause your body to have an allergic reaction. This causes you to release allergic antibodies. Through a chain of events, these eventually cause you to release histamine into the blood stream. Although meant to protect the body, it is this release of histamine that causes your discomfort, such as frequent sneezing, congestion, and an itchy, runny nose. What are the causes? Seasonal allergic rhinitis (hay fever) is caused by pollen allergens that may come from grasses, trees, and weeds. Year-round allergic rhinitis (perennial allergic rhinitis) is caused by allergens such as house dust mites, pet dander, and mold spores. What are the signs or symptoms?  Nasal stuffiness (congestion).  Itchy, runny nose with sneezing and tearing of the eyes. How is this diagnosed? Your health care provider can help you determine the allergen or allergens that trigger your symptoms. If you and your health care provider are unable to determine the allergen, skin or blood testing may be used. Your health care provider will diagnose your condition after taking your health history and performing a physical exam. Your health care provider may assess you for other related conditions, such as asthma, pink eye, or an ear infection. How is this treated? Allergic rhinitis does not have a cure, but it can be controlled by:  Medicines that block allergy symptoms. These may include allergy shots, nasal sprays, and oral antihistamines.  Avoiding the allergen. Hay fever may often be treated with antihistamines in pill or nasal spray forms. Antihistamines block the effects of histamine. There are over-the-counter medicines that may help with nasal congestion and  swelling around the eyes. Check with your health care provider before taking or giving this medicine. If avoiding the allergen or the medicine prescribed do not work, there are many new medicines your health care provider can prescribe. Stronger medicine may be used if initial measures are ineffective. Desensitizing injections can be used if medicine and avoidance does not work. Desensitization is when a patient is given ongoing shots until the body becomes less sensitive to the allergen. Make sure you follow up with your health care provider if problems continue. Follow these instructions at home: It is not possible to completely avoid allergens, but you can reduce your symptoms by taking steps to limit your exposure to them. It helps to know exactly what you are allergic to so that you can avoid your specific triggers. Contact a health care provider if:  You have a fever.  You develop a cough that does not stop easily (persistent).  You have shortness of breath.  You start wheezing.  Symptoms interfere with normal daily activities. This information is not intended to replace advice given to you by your health care provider. Make sure you discuss any questions you have with your health care provider. Document Released: 03/21/2001 Document Revised: 02/25/2016 Document Reviewed: 03/03/2013 Elsevier Interactive Patient Education  2017 [employer].    IF you received an x-ray today, you will receive an invoice from [facility]. Please contact [facility] at [phone] with questions or concerns regarding your invoice.   IF you received labwork today, you will receive an invoice from [employer]. Please contact [facility] at [phone] with questions or concerns regarding your invoice.   Our billing staff will  not be able to assist you with questions regarding bills from these companies.  You will be contacted with the lab results as soon as they  are available. The fastest way to get your results is to activate your My Chart account. Instructions are located on the last page of this paperwork. If you have not heard from [place] regarding the results in 2 weeks, please contact this office.

## 2016-06-14 NOTE — Progress Notes (Signed)
[facility] [address] [place] [address] [phone]  Date:  06/14/2016   Name:  Kelly Reed   DOB:  Dec 08, 1965   MRN:  [id number]  PCP:  [name], MD    History of Present Illness:  Kelly Reed is a 50 y.o. female patient who presents to [facility] for cc of allergic reaction. Patient states that she was when she came into work today at 5:30. By 6 AM she was having shortness of breath, congestion, headache, and coughing. She also describes watery eyes. She used her inhaler which did not work and continued with the shortness of breath. 15 minutes ago she then threw up. She denies fever. There is mild sinus pressure at her maxillary and frontal. She is concerned that she will need a steroid shot. At this time, she states that her shortness of breath has resolved. She has no trouble with her breathing. There is no dizziness. Patient states that she told her employer of her FMLA and then left. She states that these are very busy weeks at her workplace.    Patient Active Problem List   Diagnosis Date Noted  . Anemia, iron deficiency 08/07/2014  . Allergic reaction to chemical substance 08/07/2014  . Muscle cramp, nocturnal 08/07/2014  . Essential hypertension, benign 12/07/2012  . Polypharmacy 12/07/2012  . Ganglion cyst 10/22/2012  . Routine general medical examination at a health care facility 03/29/2012  . Transaminitis 03/29/2012  . Vertigo 06/09/2011  . Hypercholesteremia 05/04/2011  . Anxiety and depression 02/01/2011  . SYNCOPE 12/27/2009  . EDEMA 03/04/2009  . Allergic state 11/16/2006  . Diabetes mellitus, type II ([place]) 10/25/2006    Past Medical History:  Diagnosis Date  . Anxiety   . Diabetes mellitus type II   . Hyperlipidemia   . NSVD (normal spontaneous vaginal delivery)    x 2    Past Surgical History:  Procedure Laterality Date  . 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 History of Abd. ultrasound  12/04   negative  .  Surg. eval lap cholecstectomy  05/28/2006   deferred by patient  . TUBAL LIGATION  1993   bilateral    Social History  Substance Use Topics  . Smoking status: Never Smoker  . Smokeless tobacco: Never Used  . Alcohol use 0.5 - 1.0 oz/week    1 - 2 drink(s) per week     Comment: Occasional beer    Family History  Problem Relation Age of Onset  . Cancer Mother     breast    Allergies  Allergen Reactions  . Adhesive [Tape] Rash and Other (See Comments)    Peeling of skin  . Prednisone     Hyperglycemia with oral steroids    Medication list has been reviewed and updated.  Current Outpatient Prescriptions on File Prior to Visit  Medication Sig Dispense Refill  . albuterol (VENTOLIN HFA) 108 (90 Base) MCG/ACT inhaler INHALE 2 PUFFS BY MOUTH INTO THE LUNGS EVERY 6 HOURS AS NEEDED WHEEZING OR SHORTNESS OF BREATH 18 g 5  . aspirin EC 81 MG tablet Take 1 tablet (81 mg total) by mouth daily.    Marland Kitchen atorvastatin (LIPITOR) 10 MG tablet TAKE 1 TABLET(10 MG) BY MOUTH DAILY 90 tablet 0  . Azelastine HCl 0.15 % SOLN Place 1 spray into the nose 2 (two) times daily. 30 mL 11  . Casanthranol-Docusate Sodium 30-100 MG CAPS Take by mouth at bedtime. Reported on 12/08/2015    .  cyclobenzaprine (FLEXERIL) 5 MG tablet Reported on 11/12/2015    . ferrous sulfate 325 (65 FE) MG tablet Take 325 mg by mouth at bedtime.      Marland Kitchen levocetirizine (XYZAL) 5 MG tablet Take 1 tablet (5 mg total) by mouth every evening. 30 tablet 11  . lisinopril (PRINIVIL,ZESTRIL) 10 MG tablet TAKE 1 TABLET BY MOUTH EVERY NIGHT AT BEDTIME 90 tablet 3  . MAGNESIUM PO Take by mouth.    . metFORMIN (GLUCOPHAGE) 1000 MG tablet TAKE 1 TABLET(1000 MG) BY MOUTH TWICE DAILY WITH A MEAL 180 tablet 3  . mometasone (NASONEX) 50 MCG/ACT nasal spray Place 2 sprays into the nose daily. 17 g 12  . montelukast (SINGULAIR) 10 MG tablet Take 1 tablet (10 mg total) by mouth at bedtime. 90 tablet 3  . Multiple Vitamins-Minerals (CENTRUM ADULTS PO)  Take by mouth.    . traZODone (DESYREL) 50 MG tablet Take 1 tablet (50 mg total) by mouth at bedtime. 90 tablet 3  . chlorpheniramine-HYDROcodone (TUSSIONEX PENNKINETIC ER) 10-8 MG/5ML SUER Take 5 mLs by mouth every 12 (twelve) hours as needed. (Patient not taking: Reported on 06/14/2016) 120 mL 0  . Pseudoephedrine-Ibuprofen 30-200 MG TABS Take by mouth.     No current facility-administered medications on file prior to visit.     ROS ROS otherwise unremarkable unless listed above.  Physical Examination: BP (!) 152/94 (BP Location: Right Arm, Patient Position: Sitting, Cuff Size: Large)   Pulse 93   Temp 98.2 F (36.8 C) (Oral)   Resp 18   Ht 5' 10.5" (1.791 m)   Wt 221 lb (100.2 kg)   SpO2 97%   BMI 31.26 kg/m  Ideal Body Weight: Weight in (lb) to have BMI = 25: 176.4  Physical Exam  Constitutional: She is oriented to person, place, and time. She appears well-developed and well-nourished. No distress.  HENT:  Head: Normocephalic and atraumatic.  Right Ear: External ear normal.  Left Ear: External ear normal.  Nose: Rhinorrhea present. Right sinus exhibits maxillary sinus tenderness and frontal sinus tenderness. Left sinus exhibits maxillary sinus tenderness and frontal sinus tenderness.  Eyes: Conjunctivae and EOM are normal. Pupils are equal, round, and reactive to light.  Cardiovascular: Normal rate.   Pulmonary/Chest: Effort normal. No accessory muscle usage. No apnea and no tachypnea. No respiratory distress. She has no decreased breath sounds. She has no wheezes. She has no rhonchi.  Neurological: She is alert and oriented to person, place, and time.  Skin: She is not diaphoretic.  Psychiatric: She has a normal mood and affect. Her behavior is normal.     Assessment and Plan: Kelly Reed is a 50 y.o. female who is here today for chief complaint of allergic reaction. The trigger to her symptoms have apparently been avoided at this time. Her symptoms are virtually  resolved. I do not think it is necessary to give her prednisone via oral or injection. She does have a history of hyperglycemia with the use of prednisone. I've advised her to return if her symptoms return. She can use the pseudoephedrine and a nasal rinse at home. Advised to continue her allergy medications.  It is hard to say what stemmed her response.    SOB (shortness of breath) - Plan: pseudoephedrine (SUDAFED) 60 MG tablet  Rhinorrhea - Plan: pseudoephedrine (SUDAFED) 60 MG tablet  Sinus pressure - Plan: pseudoephedrine (SUDAFED) 60 MG tablet  Ivar Drape, PA-C [facility] 12/6/20179:28 AM  Ivar Drape, PA-C [facility] 06/14/2016 8:54 AM

## 2016-06-25 ENCOUNTER — Other Ambulatory Visit: Payer: Self-pay | Admitting: Family Medicine

## 2016-08-12 ENCOUNTER — Other Ambulatory Visit: Payer: Self-pay | Admitting: Family Medicine

## 2016-08-12 ENCOUNTER — Ambulatory Visit (INDEPENDENT_AMBULATORY_CARE_PROVIDER_SITE_OTHER): Payer: PRIVATE HEALTH INSURANCE | Admitting: Family Medicine

## 2016-08-12 VITALS — BP 132/72 | HR 87 | Temp 97.6°F | Resp 20 | Ht 70.5 in | Wt 217.1 lb

## 2016-08-12 DIAGNOSIS — Z9109 Other allergy status, other than to drugs and biological substances: Secondary | ICD-10-CM | POA: Diagnosis not present

## 2016-08-12 DIAGNOSIS — J0191 Acute recurrent sinusitis, unspecified: Secondary | ICD-10-CM | POA: Diagnosis not present

## 2016-08-12 DIAGNOSIS — J40 Bronchitis, not specified as acute or chronic: Secondary | ICD-10-CM

## 2016-08-12 DIAGNOSIS — R509 Fever, unspecified: Secondary | ICD-10-CM | POA: Diagnosis not present

## 2016-08-12 DIAGNOSIS — J329 Chronic sinusitis, unspecified: Secondary | ICD-10-CM

## 2016-08-12 LAB — POCT INFLUENZA A/B
INFLUENZA A, POC: NEGATIVE
INFLUENZA B, POC: NEGATIVE

## 2016-08-12 MED ORDER — AZELASTINE HCL 0.15 % NA SOLN: 1.0000 | Freq: Two times a day (BID) | NASAL | 11 refills | Status: DC

## 2016-08-12 MED ORDER — CLARITHROMYCIN ER 500 MG PO TB24: 1000.0000 mg | ORAL_TABLET | Freq: Every day | ORAL | 0 refills | Status: DC

## 2016-08-12 MED ORDER — MUCINEX DM MAXIMUM STRENGTH 60-1200 MG PO TB12: 1.0000 | ORAL_TABLET | Freq: Two times a day (BID) | ORAL | 1 refills | Status: DC

## 2016-08-12 MED ORDER — HYDROCOD POLST-CPM POLST ER 10-8 MG/5ML PO SUER: 5.0000 mL | Freq: Two times a day (BID) | ORAL | 0 refills | Status: DC | PRN

## 2016-08-12 NOTE — Progress Notes (Signed)
By signing my name below, I, Mesha Guinyard, attest that this documentation has been prepared under the direction and in the presence of [name], MD.  Electronically Signed: Verlee Monte, Medical Scribe. 08/12/16. 2:40 PM.  Subjective:    Patient ID: Kelly Reed, female    DOB: Aug 17, 1965, 51 y.o.   MRN: [id number]  HPI Chief Complaint  Patient presents with  . Sore Throat  . Fever  . Cough  . Diarrhea  . Generalized Body Aches    HPI Comments: Kelly Reed is a 51 y.o. female with a PMHx of recurrent sinusitis and asthma who [facility] complaining of feeling ill onset today.  Reports associated sxs of wet cough, sore throat, sinus pressure, fever of 101, myalgias, and emesis from coughing so hard. Pt has used inhaler 3x yesterday, but not today, and has been gargling with warm salt water with little relief to her sxs. Pts co-workers have been sick and her pt's husband has a URI.  Pt reports new eye allergen onset last week that causes her eyes to swell shut. Pt mentions that when her eyes water it burns and feels like "fire" on her skin. She takes bepreve for relief of her sxs.  She was on a z-pak followed by augmentin 5 months prior and she had a course of augmentin 1 year prior.   Patient Active Problem List   Diagnosis Date Noted  . Anemia, iron deficiency 08/07/2014  . Allergic reaction to chemical substance 08/07/2014  . Muscle cramp, nocturnal 08/07/2014  . Essential hypertension, benign 12/07/2012  . Polypharmacy 12/07/2012  . Ganglion cyst 10/22/2012  . Routine general medical examination at a health care facility 03/29/2012  . Transaminitis 03/29/2012  . Vertigo 06/09/2011  . Hypercholesteremia 05/04/2011  . Anxiety and depression 02/01/2011  . SYNCOPE 12/27/2009  . EDEMA 03/04/2009  . Allergic state 11/16/2006  . Diabetes mellitus, type II ([place]) 10/25/2006   Past Medical History:  Diagnosis Date  . Anxiety   .  Asthma   . Diabetes mellitus type II   . Hyperlipidemia   . NSVD (normal spontaneous vaginal delivery)    x 2   Past Surgical History:  Procedure Laterality Date  . 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 History of Abd. ultrasound  12/04   negative  . Surg. eval lap cholecstectomy  05/28/2006   deferred by patient  . TUBAL LIGATION  1993   bilateral   Allergies  Allergen Reactions  . Adhesive [Tape] Rash and Other (See Comments)    Peeling of skin  . Prednisone     Hyperglycemia with oral steroids   Prior to Admission medications   Medication Sig Start Date End Date Taking? Authorizing Provider  albuterol (VENTOLIN HFA) 108 (90 Base) MCG/ACT inhaler INHALE 2 PUFFS BY MOUTH INTO THE LUNGS EVERY 6 HOURS AS NEEDED WHEEZING OR SHORTNESS OF BREATH 03/23/16   Shawnee Knapp, MD  aspirin EC 81 MG tablet Take 1 tablet (81 mg total) by mouth daily. 08/07/14   Shawnee Knapp, MD  atorvastatin (LIPITOR) 10 MG tablet TAKE 1 TABLET(10 MG) BY MOUTH DAILY 04/30/16   Shawnee Knapp, MD  Azelastine HCl 0.15 % SOLN Place 1 spray into the nose 2 (two) times daily. 08/07/14   Shawnee Knapp, MD  Casanthranol-Docusate Sodium 30-100 MG CAPS Take by mouth at bedtime. Reported on 12/08/2015    Historical Provider, MD  chlorpheniramine-HYDROcodone [facility]  ER) 10-8 MG/5ML SUER Take 5 mLs by mouth every 12 (twelve) hours as needed. Patient not taking: Reported on 06/14/2016 03/23/16   Shawnee Knapp, MD  cyclobenzaprine (FLEXERIL) 5 MG tablet Reported on 11/12/2015    Historical Provider, MD  ferrous sulfate 325 (65 FE) MG tablet Take 325 mg by mouth at bedtime.      Historical Provider, MD  levocetirizine (XYZAL) 5 MG tablet Take 1 tablet (5 mg total) by mouth every evening. 03/23/16   Shawnee Knapp, MD  levocetirizine (XYZAL) 5 MG tablet TAKE 1 TABLET(5 MG) BY MOUTH EVERY EVENING 06/28/16   Dorian Heckle English, PA  lisinopril (PRINIVIL,ZESTRIL) 10 MG tablet TAKE 1 TABLET BY MOUTH EVERY NIGHT AT BEDTIME  03/23/16   Shawnee Knapp, MD  MAGNESIUM PO Take by mouth.    Historical Provider, MD  metFORMIN (GLUCOPHAGE) 1000 MG tablet TAKE 1 TABLET(1000 MG) BY MOUTH TWICE DAILY WITH A MEAL 03/23/16   Shawnee Knapp, MD  mometasone (NASONEX) 50 MCG/ACT nasal spray Place 2 sprays into the nose daily. 03/23/16   Shawnee Knapp, MD  montelukast (SINGULAIR) 10 MG tablet Take 1 tablet (10 mg total) by mouth at bedtime. 09/16/15   Shawnee Knapp, MD  Multiple Vitamins-Minerals (CENTRUM ADULTS PO) Take by mouth.    Historical Provider, MD  Pseudoephedrine-Ibuprofen 30-200 MG TABS Take by mouth.    Historical Provider, MD  traZODone (DESYREL) 50 MG tablet Take 1 tablet (50 mg total) by mouth at bedtime. 03/23/16   Shawnee Knapp, MD   Social History   Social History  . Marital status: Married    Spouse name: N/A  . Number of children: 2  . Years of education: N/A   Occupational History  . Shipping Dept. [employer]   Social History Main Topics  . Smoking status: Never Smoker  . Smokeless tobacco: Never Used  . Alcohol use 0.5 - 1.0 oz/week    1 - 2 drink(s) per week     Comment: Occasional beer  . Drug use: No  . Sexual activity: Yes   Other Topics Concern  . Not on file   Social History Narrative   2 Sons   Works at [employer]   Married   Review of Systems  Constitutional: Positive for fever.  HENT: Positive for sinus pressure and sore throat.   Eyes: Positive for pain.  Respiratory: Positive for cough.   Gastrointestinal: Positive for vomiting. Negative for nausea.  Musculoskeletal: Positive for myalgias.  Allergic/Immunologic: Positive for environmental allergies.   Objective:  Physical Exam  Constitutional: She appears well-developed and well-nourished. No distress.  HENT:  Head: Normocephalic and atraumatic.  Right Ear: Tympanic membrane, external ear and ear canal normal.  Left Ear: Tympanic membrane, external ear and ear canal normal.  Nose: Rhinorrhea present.  Mouth/Throat: Oropharynx  is clear and moist. No oropharyngeal exudate or posterior oropharyngeal erythema.  Eyes: Conjunctivae are normal.  Neck: Neck supple.  Cardiovascular: Regular rhythm.  Tachycardia present.  Exam reveals no gallop and no friction rub.   Murmur heard.  Systolic (left upper sternal border) murmur is present with a grade of 1/6  Pulmonary/Chest: Effort normal and breath sounds normal. No respiratory distress. She has no wheezes. She has no rales.  Neurological: She is alert.  Skin: Skin is warm and dry.  Psychiatric: She has a normal mood and affect. Her behavior is normal.  Nursing note and vitals reviewed.  BP 132/72   Pulse 87  Temp 97.6 F (36.4 C) (Oral)   Resp 20   Ht 5' 10.5" (1.791 m)   Wt 217 lb 2 oz (98.5 kg)   SpO2 99%   BMI 30.71 kg/m    Results for orders placed or performed in visit on 08/12/16  POCT Influenza A/B  Result Value Ref Range   Influenza A, POC Negative Negative   Influenza B, POC Negative Negative   Assessment & Plan:   1. Fever, unspecified fever cause   2. Acute recurrent sinusitis, unspecified location   3. Sinobronchitis   4. Environmental allergies     Orders Placed This Encounter  Procedures  . Ambulatory referral to Allergy    Referral Priority:   Routine    Referral Type:   Allergy Testing    Referral Reason:   Specialty Services Required    Requested Specialty:   Allergy    Number of Visits Requested:   1  . POCT Influenza A/B    Meds ordered this encounter  Medications  . Azelastine HCl 0.15 % SOLN    Sig: Place 1 spray into the nose 2 (two) times daily.    Dispense:  30 mL    Refill:  11  . clarithromycin (BIAXIN XL) 500 MG 24 hr tablet    Sig: Take 2 tablets (1,000 mg total) by mouth daily.    Dispense:  20 tablet    Refill:  0  . chlorpheniramine-HYDROcodone (TUSSIONEX PENNKINETIC ER) 10-8 MG/5ML SUER    Sig: Take 5 mLs by mouth every 12 (twelve) hours as needed.    Dispense:  120 mL    Refill:  0  .  Dextromethorphan-Guaifenesin (MUCINEX DM MAXIMUM STRENGTH) 60-1200 MG TB12    Sig: Take 1 tablet by mouth every 12 (twelve) hours.    Dispense:  14 each    Refill:  1    I personally performed the services described in this documentation, which was scribed in my presence. The recorded information has been reviewed and considered, and addended by me as needed.   [name], M.D.  Primary Care at [facility] [address] [place], [place] [address] [phone] phone [phone] fax  09/01/16 9:33 PM

## 2016-08-12 NOTE — Patient Instructions (Addendum)
     IF you received an x-ray today, you will receive an invoice from [facility]. Please contact [facility] at [phone] with questions or concerns regarding your invoice.   IF you received labwork today, you will receive an invoice from [facility]. Please contact [facility] at [phone] with questions or concerns regarding your invoice.   Our billing staff will not be able to assist you with questions regarding bills from these companies.  You will be contacted with the lab results as soon as they are available. The fastest way to get your results is to activate your My Chart account. Instructions are located on the last page of this paperwork. If you have not heard from [place] regarding the results in 2 weeks, please contact this office.    Sinus Rinse WHAT IS A SINUS RINSE? A sinus rinse is a simple home treatment that is used to rinse your sinuses with a sterile mixture of salt and water (saline solution). Sinuses are air-filled spaces in your skull behind the bones of your face and forehead that open into your nasal cavity. You will use the following:  Saline solution.  Neti pot or spray bottle. This releases the saline solution into your nose and through your sinuses. Neti pots and spray bottles can be purchased at [employer], a health food store, or online. WHEN WOULD I DO A SINUS RINSE? A sinus rinse can help to clear mucus, dirt, dust, or pollen from the nasal cavity. You may do a sinus rinse when you have a cold, a virus, nasal allergy symptoms, a sinus infection, or stuffiness in the nose or sinuses. If you are considering a sinus rinse:  Ask your child's health care provider before performing a sinus rinse on your child.  Do not do a sinus rinse if you have had ear or nasal surgery, ear infection, or blocked ears. HOW DO I DO A SINUS RINSE?  Wash your hands.  Disinfect your device according to the directions provided and then dry it.  Use the  solution that comes with your device or one that is sold separately in stores. Follow the mixing directions on the package.  Fill your device with the amount of saline solution as directed by the device instructions.  Stand over a sink and tilt your head sideways over the sink.  Place the spout of the device in your upper nostril (the one closer to the ceiling).  Gently pour or squeeze the saline solution into the nasal cavity. The liquid should drain to the lower nostril if you are not overly congested.  Gently blow your nose. Blowing too hard may cause ear pain.  Repeat in the other nostril.  Clean and rinse your device with clean water and then air-dry it. ARE THERE RISKS OF A SINUS RINSE? Sinus rinse is generally very safe and effective. However, there are a few risks, which include:  A burning sensation in the sinuses. This may happen if you do not make the saline solution as directed. Make sure to follow all directions when making the saline solution.  Infection from contaminated water. This is rare, but possible.  Nasal irritation. This information is not intended to replace advice given to you by your health care provider. Make sure you discuss any questions you have with your health care provider. Document Released: 01/21/2014 Document Revised: 02/20/2016 Document Reviewed: 11/11/2013 Elsevier Interactive Patient Education  2017 [employer].

## 2016-09-09 ENCOUNTER — Telehealth: Payer: Self-pay

## 2016-09-09 NOTE — Telephone Encounter (Signed)
levocetrirzine on back order can they sub with cetirizine?

## 2016-09-09 NOTE — Telephone Encounter (Signed)
Yep cetirizine 10mg  please. thanks

## 2016-09-11 MED ORDER — CETIRIZINE HCL 10 MG PO TABS: 10.0000 mg | ORAL_TABLET | Freq: Every day | ORAL | 2 refills | Status: DC

## 2016-09-19 NOTE — Telephone Encounter (Signed)
tussionex rx never picked up Destroyed by me

## 2016-09-20 DIAGNOSIS — J302 Other seasonal allergic rhinitis: Secondary | ICD-10-CM | POA: Insufficient documentation

## 2016-09-20 DIAGNOSIS — J45909 Unspecified asthma, uncomplicated: Secondary | ICD-10-CM | POA: Insufficient documentation

## 2016-09-20 DIAGNOSIS — H1045 Other chronic allergic conjunctivitis: Secondary | ICD-10-CM | POA: Insufficient documentation

## 2016-09-20 NOTE — Progress Notes (Signed)
Subjective:    Patient ID: Kelly Reed, female    DOB: 30-Jun-1966, 51 y.o.   MRN: [id number] Chief Complaint  Patient presents with  . Follow-up    diabetes  . Medication Refill    Singulair    HPI  Kelly Reed is a 51 y.o. female who presents to the [facility] for 6 month reevaluation of her chronic medical conditions.   DM: On metformin 1000mg  BID. Hemoglobin A1C has been stable at 6.6 -> 6.3 for years.  Checks her CBGs regularly. cbgs have been variable ranging fasting a.m. 100-125. Pt takes 81 mg aspirin and 10 mg lisinopril daily. Pt's last foot exam was done on 03/23/16.  Urine microalbumin last done 09/16/15. Having swelling of left foot from her h/o surg there which is chronic. Optho exam at [facility] annually - last reported was 04/09/2016.  HLD: On Lipitor 10mg . At goal. LDL 73->82 with non HDL of 106->107 at last lipids 11/2015. She is fasting today.  Iron deficiency anemia: CBC normal over that last year. Ferretin 18 ->41 6 months prior.  Is taking one tab/d of iron supp  Insomnia with leg cramps: Takes magnesium, flexeril 5, and trazodone 50 qhs. Improved recently and has weaned off of pills other than when she has to travel out of town to take care of her mother every weekend.  Seasonal allergies and asthma: This is a moderate-severe recurrent problem chronically and does get worse seasonally and with dust/other enivronmental exposures with freq flaires. Has failed all otc allergy eye drops and cough meds.  On zyrtec 10, singulair 10mg  qhs, nasonex 2 sprays qd, azelastine 0.15% 1 spray bid, and prn albuterol.  Had acute flair yesterday with low grade fevers, freq thin clear rhinits, lots of sinus pressure and pain all over. Ears feel full like need to pop. Yesterday developed acute eye drainage and itching.  Throat scratchy. Cough daily and is productive of clear mucous in the morning.  Used albuterol twice yest and today already.  Prior to that did need it 6d prior - having 2-3 days a week where she feels sick and miserable with allergies. We referred her to allergy last mo but she has not heard from them. She is used patanol and pataday without success. She is using bepreve (bepotastine 1.5%) which works [employer] consultant but it cost her 100$ for 5 ml  Pt due for tetanus shot, receives flu shots at work but she wasn't able to get it because she spend the whole season on and off prednisone and ill with allergies.  It has been a while since her last tetanus pt admits Nml vit D 35 05/17, nml tsh and thyroid panel 5/17  Past Medical History:  Diagnosis Date  . Anxiety   . Asthma   . Diabetes mellitus type II   . Hyperlipidemia   . NSVD (normal spontaneous vaginal delivery)    x 2   Past Surgical History:  Procedure Laterality Date  . 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 History of Abd. ultrasound  12/04   negative  . Surg. eval lap cholecstectomy  05/28/2006   deferred by patient  . TUBAL LIGATION  1993   bilateral   Current Outpatient Prescriptions on File Prior to Visit  Medication Sig Dispense Refill  . albuterol (VENTOLIN HFA) 108 (90 Base) MCG/ACT inhaler INHALE 2 PUFFS BY MOUTH INTO THE LUNGS EVERY 6 HOURS AS NEEDED WHEEZING OR SHORTNESS OF  BREATH 18 g 5  . aspirin EC 81 MG tablet Take 1 tablet (81 mg total) by mouth daily.    Marland Kitchen atorvastatin (LIPITOR) 10 MG tablet TAKE 1 TABLET(10 MG) BY MOUTH DAILY 90 tablet 0  . Azelastine HCl 0.15 % SOLN Place 1 spray into the nose 2 (two) times daily. 30 mL 11  . cetirizine (ZYRTEC) 10 MG tablet Take 1 tablet (10 mg total) by mouth daily. 30 tablet 2  . ferrous sulfate 325 (65 FE) MG tablet Take 325 mg by mouth at bedtime.      Marland Kitchen lisinopril (PRINIVIL,ZESTRIL) 10 MG tablet TAKE 1 TABLET BY MOUTH EVERY NIGHT AT BEDTIME 90 tablet 3  . MAGNESIUM PO Take by mouth.    . metFORMIN (GLUCOPHAGE) 1000 MG tablet TAKE 1 TABLET(1000 MG) BY MOUTH TWICE DAILY WITH A MEAL  180 tablet 3  . mometasone (NASONEX) 50 MCG/ACT nasal spray Place 2 sprays into the nose daily. 17 g 12  . Multiple Vitamins-Minerals (CENTRUM ADULTS PO) Take by mouth.    . traZODone (DESYREL) 50 MG tablet Take 1 tablet (50 mg total) by mouth at bedtime. 90 tablet 3   No current facility-administered medications on file prior to visit.    Allergies  Allergen Reactions  . Adhesive [Tape] Rash and Other (See Comments)    Peeling of skin  . Prednisone     Hyperglycemia with oral steroids   Family History  Problem Relation Age of Onset  . Cancer Mother     breast   Social History   Social History  . Marital status: Married    Spouse name: N/A  . Number of children: 2  . Years of education: N/A   Occupational History  . Shipping Dept. [employer]   Social History Main Topics  . Smoking status: Never Smoker  . Smokeless tobacco: Never Used  . Alcohol use 0.5 - 1.0 oz/week    1 - 2 drink(s) per week     Comment: Occasional beer  . Drug use: No  . Sexual activity: Yes   Other Topics Concern  . None   Social History Narrative   2 Sons   Works at [employer]   Married   Depression screen [facility] 2/9 09/21/2016 08/12/2016 06/14/2016 03/23/2016 03/16/2016  Decreased Interest 0 0 0 0 0  Down, Depressed, Hopeless 0 0 0 0 0  PHQ - 2 Score 0 0 0 0 0     Review of Systems See hpi    Objective:   Physical Exam  Constitutional: She is oriented to person, place, and time. She appears well-developed and well-nourished. No distress.  HENT:  Head: Normocephalic and atraumatic.  Right Ear: External ear normal.  Left Ear: External ear normal.  Eyes: Conjunctivae are normal. No scleral icterus.  Neck: Normal range of motion. Neck supple. No thyromegaly present.  Cardiovascular: Normal rate, regular rhythm, normal heart sounds and intact distal pulses.   Pulmonary/Chest: Effort normal and breath sounds normal. No respiratory distress.  Musculoskeletal: She exhibits no edema.   Lymphadenopathy:    She has no cervical adenopathy.  Neurological: She is alert and oriented to person, place, and time.  Skin: Skin is warm and dry. She is not diaphoretic. No erythema.  Psychiatric: She has a normal mood and affect. Her behavior is normal.      BP 129/81 (BP Location: Right Arm, Patient Position: Sitting, Cuff Size: Large)   Pulse 84   Temp 97.9 F (36.6 C) (Oral)  Resp 18   Ht 5' 10.5" (1.791 m)   Wt 217 lb 6.4 oz (98.6 kg)   SpO2 98%   BMI 30.75 kg/m      Assessment & Plan:  CPE in 4-6 mos tdap A1c, cbc, cmp, ferritin, lipids,  Urine microalb Will try epinastine or azelastine optho gtts instead of bepotastine in hopes of less expense Make sure refills on all meds until f/u  1. Type 2 diabetes mellitus without complication, without long-term current use of insulin ([place])   2. Muscle cramp, nocturnal   3. Hypercholesteremia   4. Iron deficiency anemia, unspecified iron deficiency anemia type   5. Mild intermittent asthma without complication   6. Chronic seasonal allergic rhinitis, unspecified trigger   7. Insomnia, unspecified type   8. Other seasonal allergic rhinitis     Orders Placed This Encounter  Procedures  . Comprehensive metabolic panel    Order Specific Question:   Has the patient fasted?    Answer:   Yes  . Lipid panel    Order Specific Question:   Has the patient fasted?    Answer:   Yes  . Hemoglobin A1c  . Ferritin  . Microalbumin/Creatinine Ratio, Urine  . CBC with Differential/Platelet  . Care order/instruction:    Scheduling Instructions:     Complete orders, AVS and go.  Marland Kitchen HM Diabetes Foot Exam    Meds ordered this encounter  Medications  . montelukast (SINGULAIR) 10 MG tablet    Sig: Take 1 tablet (10 mg total) by mouth at bedtime.    Dispense:  90 tablet    Refill:  3  . methylPREDNISolone acetate (DEPO-MEDROL) injection 120 mg  . predniSONE (DELTASONE) 20 MG tablet    Sig: Take 2 tablets (40 mg total) by mouth  daily with breakfast.    Dispense:  10 tablet    Refill:  1  . Epinastine HCl 0.05 % ophthalmic solution    Sig: Place 1 drop into both eyes 2 (two) times daily.    Dispense:  5 mL    Refill:  2    [name], M.D.  Primary Care at [facility] [address] [place], [place] [address] [phone] phone [phone] fax  11/02/16 11:09 PM

## 2016-09-21 ENCOUNTER — Other Ambulatory Visit: Payer: Self-pay | Admitting: Family Medicine

## 2016-09-21 ENCOUNTER — Encounter: Payer: Self-pay | Admitting: Family Medicine

## 2016-09-21 ENCOUNTER — Telehealth: Payer: Self-pay | Admitting: Family Medicine

## 2016-09-21 ENCOUNTER — Ambulatory Visit (INDEPENDENT_AMBULATORY_CARE_PROVIDER_SITE_OTHER): Payer: PRIVATE HEALTH INSURANCE | Admitting: Family Medicine

## 2016-09-21 VITALS — BP 129/81 | HR 84 | Temp 97.9°F | Resp 18 | Ht 70.5 in | Wt 217.4 lb

## 2016-09-21 DIAGNOSIS — R252 Cramp and spasm: Secondary | ICD-10-CM | POA: Diagnosis not present

## 2016-09-21 DIAGNOSIS — J302 Other seasonal allergic rhinitis: Secondary | ICD-10-CM | POA: Diagnosis not present

## 2016-09-21 DIAGNOSIS — D509 Iron deficiency anemia, unspecified: Secondary | ICD-10-CM | POA: Diagnosis not present

## 2016-09-21 DIAGNOSIS — G4700 Insomnia, unspecified: Secondary | ICD-10-CM | POA: Diagnosis not present

## 2016-09-21 DIAGNOSIS — E7800 Pure hypercholesterolemia, unspecified: Secondary | ICD-10-CM | POA: Diagnosis not present

## 2016-09-21 DIAGNOSIS — J4520 Mild intermittent asthma, uncomplicated: Secondary | ICD-10-CM

## 2016-09-21 DIAGNOSIS — E119 Type 2 diabetes mellitus without complications: Secondary | ICD-10-CM

## 2016-09-21 MED ORDER — PREDNISONE 20 MG PO TABS: 40.0000 mg | ORAL_TABLET | Freq: Every day | ORAL | 1 refills | Status: DC

## 2016-09-21 MED ORDER — METHYLPREDNISOLONE ACETATE 80 MG/ML IJ SUSP
120.0000 mg | Freq: Once | INTRAMUSCULAR | Status: AC
  Administered 2016-09-21: 120 mg via INTRAMUSCULAR

## 2016-09-21 MED ORDER — MONTELUKAST SODIUM 10 MG PO TABS: 10.0000 mg | ORAL_TABLET | Freq: Every day | ORAL | 3 refills | Status: DC

## 2016-09-21 MED ORDER — EPINASTINE HCL 0.05 % OP SOLN: 1.0000 [drp] | Freq: Two times a day (BID) | OPHTHALMIC | 2 refills | Status: DC

## 2016-09-21 NOTE — Telephone Encounter (Signed)
Pt is undergoing an allergy test March 28,2018 and they told her no antihistamines for 3 days. She has really bad allergies and they told her she could take montelukast (SINGULAIR) 10 MG tablet [[id number]] and benadryl but nothing else pt says she is going to need something else for her allergies for those 3 days.   Please advise: [phone] home   [phone] cell

## 2016-09-21 NOTE — Telephone Encounter (Signed)
Pt contacted and we discussed that she can use her singulair but to avoid benedryl if the allergist said to avoid antihistamines as benedryl is an antihistamine. Informed she could try flonase but to start it now as it takes a couple weeks to get into your system.

## 2016-09-21 NOTE — Patient Instructions (Addendum)
  Go ahead and call [facility] to get an appointment for the chronic allergies and sinusitis   IF you received an x-ray today, you will receive an invoice from [facility]. Please contact [facility] at [phone] with questions or concerns regarding your invoice.   IF you received labwork today, you will receive an invoice from [facility]. Please contact [facility] at [phone] with questions or concerns regarding your invoice.   Our billing staff will not be able to assist you with questions regarding bills from these companies.  You will be contacted with the lab results as soon as they are available. The fastest way to get your results is to activate your My Chart account. Instructions are located on the last page of this paperwork. If you have not heard from [place] regarding the results in 2 weeks, please contact this office.

## 2016-09-22 LAB — COMPREHENSIVE METABOLIC PANEL WITH GFR
ALT: 30 IU/L (ref 0–32)
AST: 25 IU/L (ref 0–40)
Albumin/Globulin Ratio: 2 (ref 1.2–2.2)
Albumin: 4.7 g/dL (ref 3.5–5.5)
Alkaline Phosphatase: 73 IU/L (ref 39–117)
BUN/Creatinine Ratio: 21 (ref 9–23)
BUN: 13 mg/dL (ref 6–24)
Bilirubin Total: 0.3 mg/dL (ref 0.0–1.2)
CO2: 24 mmol/L (ref 18–29)
Calcium: 9.8 mg/dL (ref 8.7–10.2)
Chloride: 101 mmol/L (ref 96–106)
Creatinine, Ser: 0.61 mg/dL (ref 0.57–1.00)
GFR calc Af Amer: 121 mL/min/1.73
GFR calc non Af Amer: 105 mL/min/1.73
Globulin, Total: 2.4 g/dL (ref 1.5–4.5)
Glucose: 136 mg/dL — ABNORMAL HIGH (ref 65–99)
Potassium: 4.5 mmol/L (ref 3.5–5.2)
Sodium: 143 mmol/L (ref 134–144)
Total Protein: 7.1 g/dL (ref 6.0–8.5)

## 2016-09-22 LAB — MICROALBUMIN / CREATININE URINE RATIO
Creatinine, Urine: 12.2 mg/dL
Microalb/Creat Ratio: 32 mg/g{creat} — ABNORMAL HIGH (ref 0.0–30.0)
Microalbumin, Urine: 3.9 ug/mL

## 2016-09-22 LAB — LIPID PANEL
CHOLESTEROL TOTAL: 184 mg/dL (ref 100–199)
Chol/HDL Ratio: 2.9 ratio units (ref 0.0–4.4)
HDL: 63 mg/dL (ref >39)
LDL CALC: 98 mg/dL (ref 0–99)
Triglycerides: 117 mg/dL (ref 0–149)
VLDL CHOLESTEROL CAL: 23 mg/dL (ref 5–40)

## 2016-09-22 LAB — FERRITIN: FERRITIN: 68 ng/mL (ref 15–150)

## 2016-09-22 LAB — HEMOGLOBIN A1C
ESTIMATED AVERAGE GLUCOSE: 140 mg/dL
HEMOGLOBIN A1C: 6.5 % — AB (ref 4.8–5.6)

## 2016-10-04 ENCOUNTER — Other Ambulatory Visit: Payer: Self-pay | Admitting: Allergy

## 2016-10-04 ENCOUNTER — Ambulatory Visit
Admission: RE | Admit: 2016-10-04 | Discharge: 2016-10-04 | Disposition: A | Discharge status: Home / Self Care | Payer: PRIVATE HEALTH INSURANCE | Source: Ambulatory Visit | Attending: Allergy | Admitting: Allergy

## 2016-10-04 DIAGNOSIS — J4530 Mild persistent asthma, uncomplicated: Secondary | ICD-10-CM

## 2016-11-16 ENCOUNTER — Ambulatory Visit (INDEPENDENT_AMBULATORY_CARE_PROVIDER_SITE_OTHER): Payer: PRIVATE HEALTH INSURANCE

## 2016-11-16 ENCOUNTER — Encounter: Payer: Self-pay | Admitting: Family Medicine

## 2016-11-16 ENCOUNTER — Ambulatory Visit (INDEPENDENT_AMBULATORY_CARE_PROVIDER_SITE_OTHER): Payer: PRIVATE HEALTH INSURANCE | Admitting: Family Medicine

## 2016-11-16 VITALS — BP 129/80 | HR 82 | Temp 98.1°F | Resp 17 | Ht 70.5 in | Wt 220.0 lb

## 2016-11-16 DIAGNOSIS — M79671 Pain in right foot: Secondary | ICD-10-CM

## 2016-11-16 DIAGNOSIS — M722 Plantar fascial fibromatosis: Secondary | ICD-10-CM

## 2016-11-16 MED ORDER — TRAMADOL HCL 50 MG PO TABS: 50.0000 mg | ORAL_TABLET | Freq: Three times a day (TID) | ORAL | 1 refills | Status: DC | PRN

## 2016-11-16 NOTE — Progress Notes (Signed)
Kelly Reed is a 51 y.o. female who presents to Primary Care at [facility] today for Right foot pain:  1.  Right foot pain:  Present for the past week. She describes a sharp stabbing pain in her heel when she tries to bear weight. She does work on her feet all day. She says she wears tennis shoes. She works in a [employer] and is on concrete 8-10 hours a day.  Pain started 1 week ago. Worse if she is not wearing shoes. Worse first thing in the morning. She had difficulty sleeping last night secondary to the pain. No injuries or falls. No redness. Feels "a little swollen". She is using frozen water bottle as ice for relief. Also taking Aleve and Tylenol which helps somewhat.  ROS as above.  No redness or swelling of other joints.  No polyuria/polydipsia  No personal history of gout. No family history of gout.Marland Kitchen   PMH reviewed. Patient is a nonsmoker.   Past Medical History:  Diagnosis Date  . Anxiety   . Asthma   . Diabetes mellitus type II   . Hyperlipidemia   . NSVD (normal spontaneous vaginal delivery)    x 2   Past Surgical History:  Procedure Laterality Date  . 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 History of Abd. ultrasound  12/04   negative  . Surg. eval lap cholecstectomy  05/28/2006   deferred by patient  . TUBAL LIGATION  1993   bilateral    Medications reviewed. Current Outpatient Prescriptions  Medication Sig Dispense Refill  . albuterol (VENTOLIN HFA) 108 (90 Base) MCG/ACT inhaler INHALE 2 PUFFS BY MOUTH INTO THE LUNGS EVERY 6 HOURS AS NEEDED WHEEZING OR SHORTNESS OF BREATH 18 g 5  . aspirin EC 81 MG tablet Take 1 tablet (81 mg total) by mouth daily.    Marland Kitchen atorvastatin (LIPITOR) 10 MG tablet TAKE 1 TABLET(10 MG) BY MOUTH DAILY 90 tablet 0  . Azelastine HCl 0.15 % SOLN Place 1 spray into the nose 2 (two) times daily. 30 mL 11  . BREO ELLIPTA 100-25 MCG/INH AEPB INL 1 PUFF PO QD  3  . cetirizine (ZYRTEC) 10 MG tablet Take 1 tablet (10 mg total) by mouth  daily. 30 tablet 2  . Epinastine HCl 0.05 % ophthalmic solution Place 1 drop into both eyes 2 (two) times daily. 5 mL 2  . ferrous sulfate 325 (65 FE) MG tablet Take 325 mg by mouth at bedtime.      Marland Kitchen lisinopril (PRINIVIL,ZESTRIL) 10 MG tablet TAKE 1 TABLET BY MOUTH EVERY NIGHT AT BEDTIME 90 tablet 3  . MAGNESIUM PO Take by mouth.    . metFORMIN (GLUCOPHAGE) 1000 MG tablet TAKE 1 TABLET(1000 MG) BY MOUTH TWICE DAILY WITH A MEAL 180 tablet 3  . mometasone (NASONEX) 50 MCG/ACT nasal spray Place 2 sprays into the nose daily. 17 g 12  . montelukast (SINGULAIR) 10 MG tablet Take 1 tablet (10 mg total) by mouth at bedtime. 90 tablet 3  . Multiple Vitamins-Minerals (CENTRUM ADULTS PO) Take by mouth.    . traZODone (DESYREL) 50 MG tablet Take 1 tablet (50 mg total) by mouth at bedtime. 90 tablet 3  . predniSONE (DELTASONE) 20 MG tablet Take 2 tablets (40 mg total) by mouth daily with breakfast. (Patient not taking: Reported on 11/16/2016) 10 tablet 1   No current facility-administered medications for this visit.      Physical Exam:  BP 129/80 (BP Location: Right Arm,  Patient Position: Sitting, Cuff Size: Large)   Pulse 82   Temp 98.1 F (36.7 C) (Oral)   Resp 17   Ht 5' 10.5" (1.791 m)   Wt 220 lb (99.8 kg)   SpO2 97%   BMI 31.12 kg/m  Gen:  Alert, cooperative patient who appears stated age in no acute distress.  Vital signs reviewed. HEENT: EOMI,  MMM MSK:   - Left foot WNL - Right foot:  No syndesmotic pain. No redness to foot. No appreciable swelling.  No Achilles tenderness. No tenderness on the dorsum of her foot. No pain or tenderness and toes. Plantar dorsiflexion 5 out of 5. She has direct point tenderness directly over the plantar fascia insertion onto her heel. Some tenderness to her calcaneus. She does walk with an antalgic gait  Assessment and Plan:  1.  Plantar fasciitis: - with heel spur on radiographs - She already has appt with podiatry in 2 weeks.  - Symptomatic  treatment for now -- OTC shoe inserts, continue alleve as anti-inflammatory, tramadol for severe pain - given CD of her radiographs here in clinic

## 2016-11-16 NOTE — Patient Instructions (Signed)
It was good to see you again today.  I'm sorry that your foot is really hurting you.  You have both a heel spur and plantar fasciitis.   Purchase some inserts to help with support.    Keep taking the alleve as an anti-inflammatory.  This will also help some with pain.  Take the Tramadol when you are having severe pain.  Keep your appt with the podiatrist.

## 2016-11-21 ENCOUNTER — Ambulatory Visit: Payer: Self-pay | Admitting: *Deleted

## 2016-11-21 ENCOUNTER — Other Ambulatory Visit: Payer: Self-pay | Admitting: Family Medicine

## 2016-11-21 VITALS — BP 135/89 | HR 87 | Ht 70.0 in | Wt 224.0 lb

## 2016-11-21 DIAGNOSIS — Z0000 Encounter for general adult medical examination without abnormal findings: Secondary | ICD-10-CM

## 2016-11-21 NOTE — Progress Notes (Signed)
[employer] insurance premium discount evaluation: Labs Drawn. [employer] ROI form signed. Tobacco Free Attestation form signed.  Forms placed in paper chart.  

## 2016-11-22 LAB — CMP12+LP+TP+TSH+6AC+CBC/D/PLT
A/G RATIO: 1.8 (ref 1.2–2.2)
ALK PHOS: 59 IU/L (ref 39–117)
ALT: 30 IU/L (ref 0–32)
AST: 28 IU/L (ref 0–40)
Albumin: 4.6 g/dL (ref 3.5–5.5)
BASOS ABS: 0 10*3/uL (ref 0.0–0.2)
BASOS: 0 %
BILIRUBIN TOTAL: 0.3 mg/dL (ref 0.0–1.2)
BUN / CREAT RATIO: 19 (ref 9–23)
BUN: 13 mg/dL (ref 6–24)
CHLORIDE: 100 mmol/L (ref 96–106)
CREATININE: 0.69 mg/dL (ref 0.57–1.00)
Calcium: 9.4 mg/dL (ref 8.7–10.2)
Chol/HDL Ratio: 2.7 ratio (ref 0.0–4.4)
Cholesterol, Total: 186 mg/dL (ref 100–199)
EOS (ABSOLUTE): 0.1 10*3/uL (ref 0.0–0.4)
EOS: 2 %
Free Thyroxine Index: 2.6 (ref 1.2–4.9)
GFR, EST AFRICAN AMERICAN: 116 mL/min/{1.73_m2} (ref >59)
GFR, EST NON AFRICAN AMERICAN: 100 mL/min/{1.73_m2} (ref >59)
GGT: 15 IU/L (ref 0–60)
Globulin, Total: 2.5 g/dL (ref 1.5–4.5)
Glucose: 124 mg/dL — ABNORMAL HIGH (ref 65–99)
HDL: 70 mg/dL (ref >39)
HEMATOCRIT: 41.4 % (ref 34.0–46.6)
HEMOGLOBIN: 13.8 g/dL (ref 11.1–15.9)
IMMATURE GRANULOCYTES: 1 %
Immature Grans (Abs): 0.1 10*3/uL (ref 0.0–0.1)
Iron: 75 ug/dL (ref 27–159)
LDH: 217 IU/L (ref 119–226)
LDL CALC: 91 mg/dL (ref 0–99)
LYMPHS ABS: 1.6 10*3/uL (ref 0.7–3.1)
Lymphs: 23 %
MCH: 30.4 pg (ref 26.6–33.0)
MCHC: 33.3 g/dL (ref 31.5–35.7)
MCV: 91 fL (ref 79–97)
MONOCYTES: 9 %
Monocytes Absolute: 0.6 10*3/uL (ref 0.1–0.9)
NEUTROS PCT: 65 %
Neutrophils Absolute: 4.4 10*3/uL (ref 1.4–7.0)
PLATELETS: 232 10*3/uL (ref 150–379)
POTASSIUM: 4.2 mmol/L (ref 3.5–5.2)
Phosphorus: 3.9 mg/dL (ref 2.5–4.5)
RBC: 4.54 x10E6/uL (ref 3.77–5.28)
RDW: 14.3 % (ref 12.3–15.4)
SODIUM: 139 mmol/L (ref 134–144)
T3 UPTAKE RATIO: 27 % (ref 24–39)
T4, Total: 9.5 ug/dL (ref 4.5–12.0)
TSH: 0.972 u[IU]/mL (ref 0.450–4.500)
Total Protein: 7.1 g/dL (ref 6.0–8.5)
Triglycerides: 123 mg/dL (ref 0–149)
URIC ACID: 4.7 mg/dL (ref 2.5–7.1)
VLDL CHOLESTEROL CAL: 25 mg/dL (ref 5–40)
WBC: 6.8 10*3/uL (ref 3.4–10.8)

## 2016-11-22 LAB — HGB A1C W/O EAG: Hgb A1c MFr Bld: 6.3 % — ABNORMAL HIGH (ref 4.8–5.6)

## 2016-11-24 NOTE — Progress Notes (Signed)
Pt not in office today. Will f/u Tuesday 5/22 upon RN's return to clinic.

## 2016-11-28 ENCOUNTER — Telehealth: Payer: Self-pay | Admitting: Family Medicine

## 2016-11-28 NOTE — Progress Notes (Signed)
Reviewed. Pt reports plantar fasciitis and a bone spur in foot that has been limiting exercise, while also going through menopause with a 20# wt gain over past year. Seeing podiatrist again later this week. Hopeful for plan that will improve pain and allow more exercise. No further questions/concerns.

## 2016-11-28 NOTE — Telephone Encounter (Signed)
Patient needs disability forms completed for her last OV for right foot pain. I have completed what I could from the [place] notes and highlighted the areas I was not sure about I will place the forms in Dr Cindra Presume box on 11/28/16 please return them to the FMLA/Disability box at the [employer] checkout desk within 5-7 business days. Thank you!

## 2016-11-28 NOTE — Telephone Encounter (Signed)
Will complete when I'm back in the office on Thursday

## 2016-11-30 NOTE — Telephone Encounter (Signed)
I have completed this and put it in the [facility] box.

## 2016-12-05 NOTE — Telephone Encounter (Signed)
Paperwork scanned and faxed to [facility] on 12/05/16

## 2016-12-06 DIAGNOSIS — Z0271 Encounter for disability determination: Secondary | ICD-10-CM

## 2016-12-29 ENCOUNTER — Other Ambulatory Visit: Payer: Self-pay | Admitting: Family Medicine

## 2017-01-08 ENCOUNTER — Other Ambulatory Visit: Payer: Self-pay | Admitting: Family Medicine

## 2017-01-25 ENCOUNTER — Ambulatory Visit: Payer: PRIVATE HEALTH INSURANCE | Admitting: Family Medicine

## 2017-02-13 NOTE — Progress Notes (Addendum)
Subjective:    Patient ID: Kelly Reed, female    DOB: [birth year], 51 y.o.   MRN: [id number] Chief Complaint  Patient presents with  . Annual Exam    CPE     HPI   Kelly Reed is a delightful 51 yo woman here today for a complete physical.  Her last was done 5 years prior at a different office though she is seen regularly for her chronic medical conditions and has a wellness exam/labs annually at her Employee Health (works for [employer]) - last done 3 mos prior with full lab "executive" panel which is available for my review in [facility]. Nml cmp, uric acid, iron, thyroid panel, cbc,   Primary Preventative Screenings: Cervical Cancer: Last pap 2011 normal with neg HR HPV.  Family Planning: s/p BTL; post-menopausal STI screening: monogamous married relationship Breast Cancer: normal 04/2015 Colorectal Cancer: willing to do but has not Tobacco use: no h/o  Bone Density: risks of Caucasian Cardiac: none, no ekg on chart Weight/blood sugar:  + type 2 DM on metformin. + obesity OTC/vit/supp/herbal:  Stopped iron supp sev mos ago?  Vit D levels low nml (last 11/2015 35) Diet/Exercise/EtOH/substances: Really wants to lose weight but unable to exercise currently due to foot pain Dentist/Optho: optho appointment with NEXT MONTH. Dental in the fall Immunizations: flu shot annually at work Immunization History  Administered Date(s) Administered  . Influenza Whole 04/09/2006  . Influenza-Unspecified 04/09/2013  . Pneumococcal Polysaccharide-23 12/06/2012  . Td 08/21/2003   Unknown family hx. Went through 11 mo w/o menses and then spotting in April and May, none since.  Chronic Medical Conditions: DM:  On metformin 1000bid, stable with well-controlled a1c for years. Microalb/Cr ratio elev at 32 09/2016 (nml <30) but eGFR nml and on lisinopril 10 for renal protection. Op[facility] seen [facility]/06/2016 [facility]. Checks her CBGs regularly. cbgs have been variable ranging fasting a.m.  100-125. Pt takes 81 mg aspirin qd. Lab Results  Component Value Date   HGBA1C 6.3 (H) 11/21/2016   HGBA1C 6.5 (H) 09/21/2016   HGBA1C 6.3 03/23/2016    HLD: lipids at goal 11/2016 LDL 91 and non-HDL 116 on lipitor 10. However, lipids have been increasing over the years. Lab Results  Component Value Date   LDLCALC 91 11/21/2016   LDLCALC 98 09/21/2016   LDLCALC 82 11/23/2015   LDLCALC 73 09/16/2015   LDLCALC 66 02/13/2015     Obesity: see above.  She is going through menopause so weight fluctuation dramatically and new acne.  She is having more sweats.   Plantar fasciitis and bone spur: Limiting exercise/activity. Seen by podiatry who did a cortisone injection as well as bought inserts, new shoes, splint, brace but then got charged >$300.   She is still having a ton of pain by the end of the day.  She is still doing stretches and massage but is now developing pain over the left lateral foot as well but can't afford to go back.  Her feet don't bother her as long as she is not on her feet all day. She get no benefit from the cortisone injection.  H/o low iron: resolved after iron supplement earlier this past winter so stopped supplementing this spring? Lab Results  Component Value Date   FERRITIN 68 09/21/2016   FERRITIN 41 03/23/2016   FERRITIN 18 05/28/2015   FERRITIN 12 02/13/2015   FERRITIN 9 (L) 08/07/2014   Lab Results  Component Value Date   IRON 75 11/21/2016  IRON 145 11/23/2015   IRON 22 (L) 12/08/2008   Insomnia with leg cramps: Takes magnesium, flexeril 5, and trazodone 50 qhs. Recently improved so was just using medications when staying with her mother (who lives sev hrs away) on the weekends.  Seasonal allergies and asthma: This is a moderate-severe recurrent problem chronically and does get worse seasonally and with dust/other enivronmental exposures with freq flaires. Has failed all otc allergy eye drops, patanol, pataday, and otc cough meds so has to be on  bepreve (bepotastine 1.5% but costs 100$ for 5 ml) so tried pt on epinastine 0.05% bid at last visit. On zyrtec 10, singulair 10m qhs, nasonex 2 sprays qd, azelastine 0.15% 1 spray bid, Breo Ellipta 100-25 1 puff qd, and prn albuterol.  Was referred to allergist 6 mos prior  Past Medical History:  Diagnosis Date  . Anxiety   . Asthma   . Diabetes mellitus type II   . Hyperlipidemia   . NSVD (normal spontaneous vaginal delivery)    x 2   Past Surgical History:  Procedure Laterality Date  . 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 History of Abd. ultrasound  12/04   negative  . Surg. eval lap cholecstectomy  05/28/2006   deferred by patient  . TUBAL LIGATION  1993   bilateral   Current Outpatient Prescriptions on File Prior to Visit  Medication Sig Dispense Refill  . albuterol (VENTOLIN HFA) 108 (90 Base) MCG/ACT inhaler INHALE 2 PUFFS BY MOUTH INTO THE LUNGS EVERY 6 HOURS AS NEEDED WHEEZING OR SHORTNESS OF BREATH 18 g 5  . aspirin EC 81 MG tablet Take 1 tablet (81 mg total) by mouth daily.    .Marland Kitchenatorvastatin (LIPITOR) 10 MG tablet TAKE 1 TABLET(10 MG) BY MOUTH DAILY 90 tablet 1  . Azelastine HCl 0.15 % SOLN Place 1 spray into the nose 2 (two) times daily. 30 mL 11  . BREO ELLIPTA 100-25 MCG/INH AEPB INL 1 PUFF PO QD  3  . cetirizine (ZYRTEC) 10 MG tablet TAKE 1 TABLET(10 MG) BY MOUTH DAILY 30 tablet 11  . Epinastine HCl 0.05 % ophthalmic solution Place 1 drop into both eyes 2 (two) times daily. 5 mL 2  . ferrous sulfate 325 (65 FE) MG tablet Take 325 mg by mouth at bedtime.      .Marland Kitchenlisinopril (PRINIVIL,ZESTRIL) 10 MG tablet TAKE 1 TABLET BY MOUTH EVERY NIGHT AT BEDTIME 90 tablet 3  . MAGNESIUM PO Take by mouth.    . metFORMIN (GLUCOPHAGE) 1000 MG tablet TAKE 1 TABLET(1000 MG) BY MOUTH TWICE DAILY WITH A MEAL 180 tablet 3  . mometasone (NASONEX) 50 MCG/ACT nasal spray Place 2 sprays into the nose daily. 17 g 12  . montelukast (SINGULAIR) 10 MG tablet Take 1 tablet (10 mg  total) by mouth at bedtime. 90 tablet 3  . Multiple Vitamins-Minerals (CENTRUM ADULTS PO) Take by mouth.    . traZODone (DESYREL) 50 MG tablet Take 1 tablet (50 mg total) by mouth at bedtime. 90 tablet 1   No current facility-administered medications on file prior to visit.    Allergies  Allergen Reactions  . Adhesive [Tape] Rash and Other (See Comments)    Peeling of skin  . Prednisone     Hyperglycemia with oral steroids   Family History  Problem Relation Age of Onset  . Cancer Mother        breast   Social History   Social History  . Marital status: Married  Spouse name: N/A  . Number of children: 2  . Years of education: N/A   Occupational History  . Shipping Dept. [employer]   Social History Main Topics  . Smoking status: Never Smoker  . Smokeless tobacco: Never Used  . Alcohol use 0.5 - 1.0 oz/week    1 - 2 drink(s) per week     Comment: Occasional beer  . Drug use: No  . Sexual activity: Yes   Other Topics Concern  . None   Social History Narrative   2 Sons   Works at [employer]   Married   Depression screen [facility] 2/9 02/15/2017 11/16/2016 09/21/2016 08/12/2016 06/14/2016  Decreased Interest 0 0 0 0 0  Down, Depressed, Hopeless 0 0 0 0 0  PHQ - 2 Score 0 0 0 0 0    Review of Systems See hpi    Objective:   Physical Exam  Constitutional: She is oriented to person, place, and time. She appears well-developed and well-nourished. No distress.  HENT:  Head: Normocephalic and atraumatic.  Right Ear: Tympanic membrane, external ear and ear canal normal.  Left Ear: Tympanic membrane, external ear and ear canal normal.  Nose: Nose normal. No mucosal edema or rhinorrhea.  Mouth/Throat: Uvula is midline, oropharynx is clear and moist and mucous membranes are normal. No posterior oropharyngeal erythema.  Eyes: Pupils are equal, round, and reactive to light. Conjunctivae and EOM are normal. Right eye exhibits no discharge. Left eye exhibits no  discharge. No scleral icterus.  Neck: Normal range of motion. Neck supple. Thyromegaly present. No thyroid mass present.  Cardiovascular: Normal rate, regular rhythm, normal heart sounds and intact distal pulses.   Pulmonary/Chest: Effort normal and breath sounds normal. No respiratory distress.  Abdominal: Soft. Bowel sounds are normal. There is no tenderness.  Genitourinary: Vagina normal and uterus normal. No breast swelling, tenderness, discharge or bleeding. Cervix exhibits no motion tenderness and no friability. Right adnexum displays no mass and no tenderness. Left adnexum displays no mass and no tenderness.  Musculoskeletal: She exhibits no edema.  Lymphadenopathy:    She has no cervical adenopathy.  Neurological: She is alert and oriented to person, place, and time. She has normal reflexes.  Skin: Skin is warm and dry. She is not diaphoretic. No erythema.  Psychiatric: She has a normal mood and affect. Her behavior is normal.     BP 120/78   Pulse 79   Temp 98 F (36.7 C) (Oral)   Resp 18   Ht 5' 10"  (1.778 m)   Wt 222 lb (100.7 kg)   SpO2 98%   BMI 31.85 kg/m   Assessment & Plan:  Ua, ekg, pap tdap Refer to GI - would like to get to colonoscopy.  Due for f/u optho visit next mo Due for mammmogram  EKG at f/u - machine broken toady. Make an appt to discuss glp1 Postmenopausal vag bleeding and + eearly hx of breast cancer  1. Annual physical exam   2. Routine screening for STI (sexually transmitted infection)   3. Encounter for screening mammogram for breast cancer   4. Screening for cardiovascular, respiratory, and genitourinary diseases   5. Screening for cervical cancer   6. Screening for colorectal cancer   7. Type 2 diabetes mellitus without complication, without long-term current use of insulin ([place])   8. Essential hypertension, benign   9. Chronic seasonal allergic rhinitis   10. Hypercholesteremia   11. Muscle cramp, nocturnal   12. Metatarsalgia of both  feet   13. Postmenopausal vaginal bleeding     Orders Placed This Encounter  Procedures  . MM Digital Screening    Standing Status:   Future    Standing Expiration Date:   04/17/2018    Order Specific Question:   Reason for Exam (SYMPTOM  OR DIAGNOSIS REQUIRED)    Answer:   screening for breast cancer    Order Specific Question:   Is the patient pregnant?    Answer:   No    Order Specific Question:   Preferred imaging location?    Answer:   External  . US Transvaginal Non-OB    Standing Status:   Future    Standing Expiration Date:   04/17/2018    Order Specific Question:   Reason for Exam (SYMPTOM  OR DIAGNOSIS REQUIRED)    Answer:   postmenopausal vaginal bleed    Order Specific Question:   Preferred imaging location?    Answer:   [facility]  . US Pelvis Complete    Standing Status:   Future    Standing Expiration Date:   04/17/2018    Order Specific Question:   Reason for Exam (SYMPTOM  OR DIAGNOSIS REQUIRED)    Answer:   postmenopausal vaginal bleeding    Order Specific Question:   Preferred imaging location?    Answer:   [facility]  . Tdap vaccine greater than or equal to 7yo IM  . Follicle Stimulating Hormone  . Ambulatory referral to Gastroenterology    Referral Priority:   Routine    Referral Type:   Consultation    Referral Reason:   Specialty Services Required    Number of Visits Requested:   1  . POCT urinalysis dipstick  . EKG 12-Lead    Standing Status:   Future    Standing Expiration Date:   02/15/2018    Meds ordered this encounter  Medications  . vitamin C (ASCORBIC ACID) 500 MG tablet    Sig: Take 500 mg by mouth daily.  . Diclofenac Sodium 1.5 % SOLN    Sig: Place 2 mLs onto the skin 4 (four) times daily.    Dispense:  150 mL    Refill:  3    [name], M.D.  Primary Care at [facility] [address] [place], [place] [address] [phone] phone [phone] fax  02/17/17 3:20 PM

## 2017-02-15 ENCOUNTER — Encounter: Payer: Self-pay | Admitting: Family Medicine

## 2017-02-15 ENCOUNTER — Ambulatory Visit (INDEPENDENT_AMBULATORY_CARE_PROVIDER_SITE_OTHER): Payer: PRIVATE HEALTH INSURANCE | Admitting: Family Medicine

## 2017-02-15 VITALS — BP 120/78 | HR 79 | Temp 98.0°F | Resp 18 | Ht 70.0 in | Wt 222.0 lb

## 2017-02-15 DIAGNOSIS — I10 Essential (primary) hypertension: Secondary | ICD-10-CM | POA: Diagnosis not present

## 2017-02-15 DIAGNOSIS — Z23 Encounter for immunization: Secondary | ICD-10-CM | POA: Diagnosis not present

## 2017-02-15 DIAGNOSIS — Z1231 Encounter for screening mammogram for malignant neoplasm of breast: Secondary | ICD-10-CM | POA: Diagnosis not present

## 2017-02-15 DIAGNOSIS — Z124 Encounter for screening for malignant neoplasm of cervix: Secondary | ICD-10-CM | POA: Diagnosis not present

## 2017-02-15 DIAGNOSIS — N950 Postmenopausal bleeding: Secondary | ICD-10-CM

## 2017-02-15 DIAGNOSIS — E7800 Pure hypercholesterolemia, unspecified: Secondary | ICD-10-CM

## 2017-02-15 DIAGNOSIS — M7742 Metatarsalgia, left foot: Secondary | ICD-10-CM

## 2017-02-15 DIAGNOSIS — J302 Other seasonal allergic rhinitis: Secondary | ICD-10-CM

## 2017-02-15 DIAGNOSIS — Z1383 Encounter for screening for respiratory disorder NEC: Secondary | ICD-10-CM | POA: Diagnosis not present

## 2017-02-15 DIAGNOSIS — Z1389 Encounter for screening for other disorder: Secondary | ICD-10-CM

## 2017-02-15 DIAGNOSIS — M7741 Metatarsalgia, right foot: Secondary | ICD-10-CM | POA: Diagnosis not present

## 2017-02-15 DIAGNOSIS — Z0000 Encounter for general adult medical examination without abnormal findings: Secondary | ICD-10-CM

## 2017-02-15 DIAGNOSIS — Z1212 Encounter for screening for malignant neoplasm of rectum: Secondary | ICD-10-CM

## 2017-02-15 DIAGNOSIS — Z113 Encounter for screening for infections with a predominantly sexual mode of transmission: Secondary | ICD-10-CM

## 2017-02-15 DIAGNOSIS — E119 Type 2 diabetes mellitus without complications: Secondary | ICD-10-CM | POA: Diagnosis not present

## 2017-02-15 DIAGNOSIS — Z136 Encounter for screening for cardiovascular disorders: Secondary | ICD-10-CM | POA: Diagnosis not present

## 2017-02-15 DIAGNOSIS — R252 Cramp and spasm: Secondary | ICD-10-CM | POA: Diagnosis not present

## 2017-02-15 DIAGNOSIS — Z1211 Encounter for screening for malignant neoplasm of colon: Secondary | ICD-10-CM

## 2017-02-15 LAB — POCT URINALYSIS DIP (MANUAL ENTRY)
Bilirubin, UA: NEGATIVE
Blood, UA: NEGATIVE
Glucose, UA: NEGATIVE mg/dL
Ketones, POC UA: NEGATIVE mg/dL
Nitrite, UA: NEGATIVE
PH UA: 5 (ref 5.0–8.0)
PROTEIN UA: NEGATIVE mg/dL
Spec Grav, UA: 1.015 (ref 1.010–1.025)
UROBILINOGEN UA: 0.2 U/dL

## 2017-02-15 MED ORDER — DICLOFENAC SODIUM 1.5 % TD SOLN: 40.0000 [drp] | Freq: Four times a day (QID) | TRANSDERMAL | 3 refills | Status: DC

## 2017-02-15 NOTE — Patient Instructions (Addendum)
Try applying the topical anti-inflammatory Pennsaid drops to your feet several times a day for direct pain relief. Continue icing your feet is frequently as possible.    IF you received an x-ray today, you will receive an invoice from [facility]. Please contact [facility] at [phone] with questions or concerns regarding your invoice.   IF you received labwork today, you will receive an invoice from [facility]. Please contact [facility] at [phone] with questions or concerns regarding your invoice.   Our billing staff will not be able to assist you with questions regarding bills from these companies.  You will be contacted with the lab results as soon as they are available. The fastest way to get your results is to activate your My Chart account. Instructions are located on the last page of this paperwork. If you have not heard from [place] regarding the results in 2 weeks, please contact this office.      Health Maintenance for Postmenopausal Women Menopause is a normal process in which your reproductive ability comes to an end. This process happens gradually over a span of months to years, usually between the ages of 51 and 23. Menopause is complete when you have missed 12 consecutive menstrual periods. It is important to talk with your health care provider about some of the most common conditions that affect postmenopausal women, such as heart disease, cancer, and bone loss (osteoporosis). Adopting a healthy lifestyle and getting preventive care can help to promote your health and wellness. Those actions can also lower your chances of developing some of these common conditions. What should I know about menopause? During menopause, you may experience a number of symptoms, such as:  Moderate-to-severe hot flashes.  Night sweats.  Decrease in sex drive.  Mood swings.  Headaches.  Tiredness.  Irritability.  Memory problems.  Insomnia.  Choosing to treat or not  to treat menopausal changes is an individual decision that you make with your health care provider. What should I know about hormone replacement therapy and supplements? Hormone therapy products are effective for treating symptoms that are associated with menopause, such as hot flashes and night sweats. Hormone replacement carries certain risks, especially as you become older. If you are thinking about using estrogen or estrogen with progestin treatments, discuss the benefits and risks with your health care provider. What should I know about heart disease and stroke? Heart disease, heart attack, and stroke become more likely as you age. This may be due, in part, to the hormonal changes that your body experiences during menopause. These can affect how your body processes dietary fats, triglycerides, and cholesterol. Heart attack and stroke are both medical emergencies. There are many things that you can do to help prevent heart disease and stroke:  Have your blood pressure checked at least every 1-2 years. High blood pressure causes heart disease and increases the risk of stroke.  If you are 55-65 years old, ask your health care provider if you should take aspirin to prevent a heart attack or a stroke.  Do not use any tobacco products, including cigarettes, chewing tobacco, or electronic cigarettes. If you need help quitting, ask your health care provider.  It is important to eat a healthy diet and maintain a healthy weight. ? Be sure to include plenty of vegetables, fruits, low-fat dairy products, and lean protein. ? Avoid eating foods that are high in solid fats, added sugars, or salt (sodium).  Get regular exercise. This is one of the most important things that  you can do for your health. ? Try to exercise for at least 150 minutes each week. The type of exercise that you do should increase your heart rate and make you sweat. This is known as moderate-intensity exercise. ? Try to do strengthening  exercises at least twice each week. Do these in addition to the moderate-intensity exercise.  Know your numbers.Ask your health care provider to check your cholesterol and your blood glucose. Continue to have your blood tested as directed by your health care provider.  What should I know about cancer screening? There are several types of cancer. Take the following steps to reduce your risk and to catch any cancer development as early as possible. Breast Cancer  Practice breast self-awareness. ? This means understanding how your breasts normally appear and feel. ? It also means doing regular breast self-exams. Let your health care provider know about any changes, no matter how small.  If you are 51 or older, have a clinician do a breast exam (clinical breast exam or CBE) every year. Depending on your age, family history, and medical history, it may be recommended that you also have a yearly breast X-ray (mammogram).  If you have a family history of breast cancer, talk with your health care provider about genetic screening.  If you are at high risk for breast cancer, talk with your health care provider about having an MRI and a mammogram every year.  Breast cancer (BRCA) gene test is recommended for women who have family members with BRCA-related cancers. Results of the assessment will determine the need for genetic counseling and BRCA1 and for BRCA2 testing. BRCA-related cancers include these types: ? Breast. This occurs in males or females. ? Ovarian. ? Tubal. This may also be called fallopian tube cancer. ? Cancer of the abdominal or pelvic lining (peritoneal cancer). ? Prostate. ? Pancreatic.  Cervical, Uterine, and Ovarian Cancer Your health care provider may recommend that you be screened regularly for cancer of the pelvic organs. These include your ovaries, uterus, and vagina. This screening involves a pelvic exam, which includes checking for microscopic changes to the surface of  your cervix (Pap test).  For women ages 21-65, health care providers may recommend a pelvic exam and a Pap test every three years. For women ages 86-65, they may recommend the Pap test and pelvic exam, combined with testing for human papilloma virus (HPV), every five years. Some types of HPV increase your risk of cervical cancer. Testing for HPV may also be done on women of any age who have unclear Pap test results.  Other health care providers may not recommend any screening for nonpregnant women who are considered low risk for pelvic cancer and have no symptoms. Ask your health care provider if a screening pelvic exam is right for you.  If you have had past treatment for cervical cancer or a condition that could lead to cancer, you need Pap tests and screening for cancer for at least 20 years after your treatment. If Pap tests have been discontinued for you, your risk factors (such as having a new sexual partner) need to be reassessed to determine if you should start having screenings again. Some women have medical problems that increase the chance of getting cervical cancer. In these cases, your health care provider may recommend that you have screening and Pap tests more often.  If you have a family history of uterine cancer or ovarian cancer, talk with your health care provider about genetic screening.  If  you have vaginal bleeding after reaching menopause, tell your health care provider.  There are currently no reliable tests available to screen for ovarian cancer.  Lung Cancer Lung cancer screening is recommended for adults 48-33 years old who are at high risk for lung cancer because of a history of smoking. A yearly low-dose CT scan of the lungs is recommended if you:  Currently smoke.  Have a history of at least 30 pack-years of smoking and you currently smoke or have quit within the past 15 years. A pack-year is smoking an average of one pack of cigarettes per day for one year.  Yearly  screening should:  Continue until it has been 15 years since you quit.  Stop if you develop a health problem that would prevent you from having lung cancer treatment.  Colorectal Cancer  This type of cancer can be detected and can often be prevented.  Routine colorectal cancer screening usually begins at age 34 and continues through age 19.  If you have risk factors for colon cancer, your health care provider may recommend that you be screened at an earlier age.  If you have a family history of colorectal cancer, talk with your health care provider about genetic screening.  Your health care provider may also recommend using home test kits to check for hidden blood in your stool.  A small camera at the end of a tube can be used to examine your colon directly (sigmoidoscopy or colonoscopy). This is done to check for the earliest forms of colorectal cancer.  Direct examination of the colon should be repeated every 5-10 years until age 13. However, if early forms of precancerous polyps or small growths are found or if you have a family history or genetic risk for colorectal cancer, you may need to be screened more often.  Skin Cancer  Check your skin from head to toe regularly.  Monitor any moles. Be sure to tell your health care provider: ? About any new moles or changes in moles, especially if there is a change in a mole's shape or color. ? If you have a mole that is larger than the size of a pencil eraser.  If any of your family members has a history of skin cancer, especially at a young age, talk with your health care provider about genetic screening.  Always use sunscreen. Apply sunscreen liberally and repeatedly throughout the day.  Whenever you are outside, protect yourself by wearing long sleeves, pants, a wide-brimmed hat, and sunglasses.  What should I know about osteoporosis? Osteoporosis is a condition in which bone destruction happens more quickly than new bone creation.  After menopause, you may be at an increased risk for osteoporosis. To help prevent osteoporosis or the bone fractures that can happen because of osteoporosis, the following is recommended:  If you are 17-75 years old, get at least 1,000 mg of calcium and at least 600 mg of vitamin D per day.  If you are older than age 51 but younger than age 16, get at least 1,200 mg of calcium and at least 600 mg of vitamin D per day.  If you are older than age 19, get at least 1,200 mg of calcium and at least 800 mg of vitamin D per day.  Smoking and excessive alcohol intake increase the risk of osteoporosis. Eat foods that are rich in calcium and vitamin D, and do weight-bearing exercises several times each week as directed by your health care provider. What should I  know about how menopause affects my mental health? Depression may occur at any age, but it is more common as you become older. Common symptoms of depression include:  Low or sad mood.  Changes in sleep patterns.  Changes in appetite or eating patterns.  Feeling an overall lack of motivation or enjoyment of activities that you previously enjoyed.  Frequent crying spells.  Talk with your health care provider if you think that you are experiencing depression. What should I know about immunizations? It is important that you get and maintain your immunizations. These include:  Tetanus, diphtheria, and pertussis (Tdap) booster vaccine.  Influenza every year before the flu season begins.  Pneumonia vaccine.  Shingles vaccine.  Your health care provider may also recommend other immunizations. This information is not intended to replace advice given to you by your health care provider. Make sure you discuss any questions you have with your health care provider. Document Released: 08/18/2005 Document Revised: 01/14/2016 Document Reviewed: 03/30/2015 Elsevier Interactive Patient Education  2018 [employer].  Exercising to [employer] Exercising can help you to lose weight. In order to lose weight through exercise, you need to do vigorous-intensity exercise. You can tell that you are exercising with vigorous intensity if you are breathing very hard and fast and cannot hold a conversation while exercising. Moderate-intensity exercise helps to maintain your current weight. You can tell that you are exercising at a moderate level if you have a higher heart rate and faster breathing, but you are still able to hold a conversation. How often should I exercise? Choose an activity that you enjoy and set realistic goals. Your health care provider can help you to make an activity plan that works for you. Exercise regularly as directed by your health care provider. This may include:  Doing resistance training twice each week, such as: ? Push-ups. ? Sit-ups. ? Lifting weights. ? Using resistance bands.  Doing a given intensity of exercise for a given amount of time. Choose from these options: ? 150 minutes of moderate-intensity exercise every week. ? 75 minutes of vigorous-intensity exercise every week. ? A mix of moderate-intensity and vigorous-intensity exercise every week.  Children, pregnant women, people who are out of shape, people who are overweight, and older adults may need to consult a health care provider for individual recommendations. If you have any sort of medical condition, be sure to consult your health care provider before starting a new exercise program. What are some activities that can help me to lose weight?  Walking at a rate of at least 4.5 miles an hour.  Jogging or running at a rate of 5 miles per hour.  Biking at a rate of at least 10 miles per hour.  Lap swimming.  Roller-skating or in-line skating.  Cross-country skiing.  Vigorous competitive sports, such as football, basketball, and soccer.  Jumping rope.  Aerobic dancing. How can I be more active in my day-to-day activities?  Use  the stairs instead of the elevator.  Take a walk during your lunch break.  If you drive, park your car farther away from work or school.  If you take public transportation, get off one stop early and walk the rest of the way.  Make all of your phone calls while standing up and walking around.  Get up, stretch, and walk around every 30 minutes throughout the day. What guidelines should I follow while exercising?  Do not exercise so much that you hurt yourself, feel dizzy, or get  very short of breath.  Consult your health care provider prior to starting a new exercise program.  Wear comfortable clothes and shoes with good support.  Drink plenty of water while you exercise to prevent dehydration or heat stroke. Body water is lost during exercise and must be replaced.  Work out until you breathe faster and your heart beats faster. This information is not intended to replace advice given to you by your health care provider. Make sure you discuss any questions you have with your health care provider. Document Released: 07/29/2010 Document Revised: 12/02/2015 Document Reviewed: 11/27/2013 Elsevier Interactive Patient Education  [employer].

## 2017-02-16 LAB — PAP IG AND HPV HIGH-RISK
HPV, high-risk: NEGATIVE
PAP SMEAR COMMENT: 0

## 2017-02-16 LAB — FOLLICLE STIMULATING HORMONE: FSH: 92.6 m[IU]/mL

## 2017-02-27 ENCOUNTER — Ambulatory Visit: Payer: PRIVATE HEALTH INSURANCE | Admitting: Physician Assistant

## 2017-03-16 ENCOUNTER — Telehealth: Payer: Self-pay | Admitting: Family Medicine

## 2017-03-16 NOTE — Telephone Encounter (Signed)
Pt is calling to check on the status of her lab results.  Please advise when they are ready for reading.  [phone]

## 2017-03-26 ENCOUNTER — Ambulatory Visit (INDEPENDENT_AMBULATORY_CARE_PROVIDER_SITE_OTHER): Payer: PRIVATE HEALTH INSURANCE | Admitting: Physician Assistant

## 2017-03-26 VITALS — BP 147/81 | HR 89 | Temp 97.7°F | Resp 16 | Ht 70.0 in | Wt 224.0 lb

## 2017-03-26 DIAGNOSIS — J069 Acute upper respiratory infection, unspecified: Secondary | ICD-10-CM

## 2017-03-26 DIAGNOSIS — J302 Other seasonal allergic rhinitis: Secondary | ICD-10-CM | POA: Diagnosis not present

## 2017-03-26 MED ORDER — MONTELUKAST SODIUM 10 MG PO TABS: 10.0000 mg | ORAL_TABLET | Freq: Every day | ORAL | 3 refills | Status: DC

## 2017-03-26 MED ORDER — HYDROCOD POLST-CPM POLST ER 10-8 MG/5ML PO SUER: 5.0000 mL | Freq: Every evening | ORAL | 0 refills | Status: DC | PRN

## 2017-03-26 NOTE — Progress Notes (Signed)
PRIMARY CARE AT [place], [place] [address] [phone]  Date:  03/26/2017   Name:  Kelly Reed   DOB:  [birth year]   MRN:  [id number]  PCP:  Shawnee Knapp, MD    History of Present Illness:  Kelly Reed is a 51 y.o. female patient who presents to PCP with  Chief Complaint  Patient presents with  . Generalized Body Aches  . Asthma    pt states she may be coming down with a cold.  . Shortness of Breath    pt states her chest is tight     Yesterday, diarrhea and malaise.  Today, she has sore throat.  She took tylenol.  Hours later, she was coughing, fatigue,  The cough is dry and non-productive.  She feels some tightness of her chest.  Throat feels swollen.  Using the inhaler did not help much.  She uses her preventative Brio treatment.  She has had no sick contacts.  She has some congestion, but no more than normal.   She is on allergy shots  Patient Active Problem List   Diagnosis Date Noted  . Asthma 09/20/2016  . Chronic seasonal allergic rhinitis 09/20/2016  . Anemia, iron deficiency 08/07/2014  . Allergic reaction to chemical substance 08/07/2014  . Muscle cramp, nocturnal 08/07/2014  . Essential hypertension, benign 12/07/2012  . Polypharmacy 12/07/2012  . Ganglion cyst 10/22/2012  . Hypercholesteremia 05/04/2011  . Anxiety and depression 02/01/2011  . EDEMA 03/04/2009  . Allergic state 11/16/2006  . Diabetes mellitus, type II ([place]) 10/25/2006    Past Medical History:  Diagnosis Date  . Anxiety   . Asthma   . Diabetes mellitus type II   . Hyperlipidemia   . NSVD (normal spontaneous vaginal delivery)    x 2    Past Surgical History:  Procedure Laterality Date  . 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 History of Abd. ultrasound  12/04   negative  . Surg. eval lap cholecstectomy  05/28/2006   deferred by patient  . TUBAL LIGATION  1993   bilateral    Social History  Substance Use Topics  . Smoking status: Never Smoker   . Smokeless tobacco: Never Used  . Alcohol use 0.5 - 1.0 oz/week    1 - 2 drink(s) per week     Comment: Occasional beer    Family History  Problem Relation Age of Onset  . Cancer Mother        breast    Allergies  Allergen Reactions  . Adhesive [Tape] Rash and Other (See Comments)    Peeling of skin  . Prednisone     Hyperglycemia with oral steroids    Medication list has been reviewed and updated.  Current Outpatient Prescriptions on File Prior to Visit  Medication Sig Dispense Refill  . albuterol (VENTOLIN HFA) 108 (90 Base) MCG/ACT inhaler INHALE 2 PUFFS BY MOUTH INTO THE LUNGS EVERY 6 HOURS AS NEEDED WHEEZING OR SHORTNESS OF BREATH 18 g 5  . aspirin EC 81 MG tablet Take 1 tablet (81 mg total) by mouth daily.    Marland Kitchen atorvastatin (LIPITOR) 10 MG tablet TAKE 1 TABLET(10 MG) BY MOUTH DAILY 90 tablet 1  . Azelastine HCl 0.15 % SOLN Place 1 spray into the nose 2 (two) times daily. 30 mL 11  . BREO ELLIPTA 100-25 MCG/INH AEPB INL 1 PUFF PO QD  3  . cetirizine (ZYRTEC) 10 MG tablet TAKE 1 TABLET(10 MG)  BY MOUTH DAILY 30 tablet 11  . Epinastine HCl 0.05 % ophthalmic solution Place 1 drop into both eyes 2 (two) times daily. 5 mL 2  . ferrous sulfate 325 (65 FE) MG tablet Take 325 mg by mouth at bedtime.      Marland Kitchen lisinopril (PRINIVIL,ZESTRIL) 10 MG tablet TAKE 1 TABLET BY MOUTH EVERY NIGHT AT BEDTIME 90 tablet 3  . MAGNESIUM PO Take by mouth.    . metFORMIN (GLUCOPHAGE) 1000 MG tablet TAKE 1 TABLET(1000 MG) BY MOUTH TWICE DAILY WITH A MEAL 180 tablet 3  . mometasone (NASONEX) 50 MCG/ACT nasal spray Place 2 sprays into the nose daily. 17 g 12  . montelukast (SINGULAIR) 10 MG tablet Take 1 tablet (10 mg total) by mouth at bedtime. 90 tablet 3  . Multiple Vitamins-Minerals (CENTRUM ADULTS PO) Take by mouth.    . traZODone (DESYREL) 50 MG tablet Take 1 tablet (50 mg total) by mouth at bedtime. 90 tablet 1  . vitamin C (ASCORBIC ACID) 500 MG tablet Take 500 mg by mouth daily.    .  Diclofenac Sodium 1.5 % SOLN Place 2 mLs onto the skin 4 (four) times daily. (Patient not taking: Reported on 03/26/2017) 150 mL 3   No current facility-administered medications on file prior to visit.     ROS ROS otherwise unremarkable unless listed above.  Physical Examination: BP (!) 147/81   Pulse 89   Temp 97.7 F (36.5 C) (Oral)   Resp 16   Ht 5\' 10"  (1.778 m)   Wt 224 lb (101.6 kg)   SpO2 98%   BMI 32.14 kg/m  Ideal Body Weight: Weight in (lb) to have BMI = 25: 173.9  Physical Exam   Assessment and Plan: Kelly Reed is a 51 y.o. female who is here today for cc of  Chief Complaint  Patient presents with  . Generalized Body Aches  . Asthma    pt states she may be coming down with a cold.  . Shortness of Breath    pt states her chest is tight   Appears to be respiratory upper infection of viral etiology. She will continue the breo and take the albuterol as needed.   Acute upper respiratory infection - Plan: chlorpheniramine-HYDROcodone (TUSSIONEX PENNKINETIC ER) 10-8 MG/5ML SUER  Other seasonal allergic rhinitis - Plan: montelukast (SINGULAIR) 10 MG tablet  Ivar Drape, PA-C [facility] 9/21/20188:03 AM

## 2017-03-26 NOTE — Telephone Encounter (Signed)
Lab notes needed. Please advise.

## 2017-03-26 NOTE — Patient Instructions (Signed)
     IF you received an x-ray today, you will receive an invoice from [facility]. Please contact  Radiology at [phone] with questions or concerns regarding your invoice.   IF you received labwork today, you will receive an invoice from [facility]. Please contact [facility] at [phone] with questions or concerns regarding your invoice.   Our billing staff will not be able to assist you with questions regarding bills from these companies.  You will be contacted with the lab results as soon as they are available. The fastest way to get your results is to activate your My Chart account. Instructions are located on the last page of this paperwork. If you have not heard from us regarding the results in 2 weeks, please contact this office.     

## 2017-03-27 ENCOUNTER — Ambulatory Visit: Payer: PRIVATE HEALTH INSURANCE | Admitting: Physician Assistant

## 2017-03-28 NOTE — Telephone Encounter (Signed)
Sent to lab pool 

## 2017-04-09 ENCOUNTER — Ambulatory Visit: Payer: Self-pay | Admitting: *Deleted

## 2017-04-09 DIAGNOSIS — S61214A Laceration without foreign body of right ring finger without damage to nail, initial encounter: Secondary | ICD-10-CM

## 2017-04-09 NOTE — Progress Notes (Signed)
Pt brought to RN clinic after cutting finger on a piece of crystal while working. Small (<0.5cm) laceration/skin tear to distal, medial aspect of R 4th finger. She reports it was bleeding heavily just after injury and she applied pepper to it to help stop bleeding. Pressure dressing was applied by another coworker in the area. In clinic, pt rinsed and cleaned wound with warm running water and antibacterial soap. Pressure applied again for approx 1 minute. Small butterfly bandage applied to laceration. Telfa and cobain applied to site. Home wound care instructions and Band-aids given to pt. Will reassess site tomorrow. Pt denies any further questions/concerns.

## 2017-04-10 ENCOUNTER — Ambulatory Visit: Payer: Self-pay | Admitting: Registered Nurse

## 2017-04-10 VITALS — BP 138/86 | HR 84 | Temp 98.2°F

## 2017-04-10 DIAGNOSIS — S61212A Laceration without foreign body of right middle finger without damage to nail, initial encounter: Secondary | ICD-10-CM

## 2017-04-10 DIAGNOSIS — S61216A Laceration without foreign body of right little finger without damage to nail, initial encounter: Secondary | ICD-10-CM

## 2017-04-10 MED ORDER — TRIPLE ANTIBIOTIC 5-400-5000 EX OINT: TOPICAL_OINTMENT | Freq: Two times a day (BID) | CUTANEOUS | 0 refills | Status: AC

## 2017-04-10 NOTE — Patient Instructions (Signed)

## 2017-04-10 NOTE — Progress Notes (Signed)
Subjective:    Patient ID: Kelly Reed, female    DOB: 02/23/1966, 51 y.o.   MRN: [id number]  Established 51y/o  caucasian female Pt presents for wound check and follow up for R 3rd and 5th finger lacerations that occurred yesterday.  Cut by piece of broken [place] and seen by RN Cheri Guppy for wound care e.g. Wash out and application triple antibiotic/bandages.  Patient reported she did not remove bandage last night and has been wearing gloves.  Stated pain has decreased since laceration occurred.  Denied tingling/numbness/fever/chills/hand pain.  Slight soreness at site of tissue injury.       Review of Systems  Constitutional: Negative for activity change, appetite change, chills, diaphoresis, fatigue and fever.  HENT: Negative for trouble swallowing and voice change.   Eyes: Negative for photophobia and visual disturbance.  Respiratory: Negative for cough, shortness of breath and wheezing.   Cardiovascular: Negative for chest pain and leg swelling.  Gastrointestinal: Negative for blood in stool, diarrhea and vomiting.  Genitourinary: Negative for difficulty urinating and hematuria.  Musculoskeletal: Positive for myalgias. Negative for arthralgias, back pain, gait problem, joint swelling, neck pain and neck stiffness.  Skin: Positive for wound. Negative for color change, pallor and rash.  Allergic/Immunologic: Positive for environmental allergies and immunocompromised state. Negative for food allergies.  Neurological: Negative for tremors and weakness.  Hematological: Negative for adenopathy. Does not bruise/bleed easily.  Psychiatric/Behavioral: Negative for confusion and sleep disturbance.       Objective:   Physical Exam  Constitutional: She is oriented to person, place, and time. Vital signs are normal. She appears well-developed and well-nourished. She is cooperative.  Non-toxic appearance. She does not have a sickly appearance. She does not appear ill. No distress.  HENT:   Head: Normocephalic and atraumatic.  Right Ear: Hearing and external ear normal.  Left Ear: Hearing and external ear normal.  Nose: Nose normal.  Mouth/Throat: Uvula is midline, oropharynx is clear and moist and mucous membranes are normal. No oropharyngeal exudate.  Eyes: Pupils are equal, round, and reactive to light. Conjunctivae, EOM and lids are normal. Right eye exhibits no discharge. Left eye exhibits no discharge. No scleral icterus.  Neck: Trachea normal, normal range of motion and phonation normal. Neck supple. No neck rigidity. No tracheal deviation, no edema, no erythema and normal range of motion present.  Cardiovascular: Normal rate, regular rhythm, normal heart sounds and intact distal pulses.   Pulses:      Radial pulses are 2+ on the right side, and 2+ on the left side.  Pulmonary/Chest: Effort normal and breath sounds normal. No accessory muscle usage or stridor. No respiratory distress. She has no wheezes. She has no rhonchi. She has no rales.  Abdominal: Normal appearance. She exhibits no distension, no fluid wave and no ascites. There is no rigidity and no guarding.  Musculoskeletal: Normal range of motion. She exhibits edema, tenderness and deformity.       Right shoulder: Normal.       Left shoulder: Normal.       Right elbow: Normal.      Left elbow: Normal.       Right wrist: Normal.       Left wrist: Normal.       Right hip: Normal.       Left hip: Normal.       Right knee: Normal.       Left knee: Normal.       Cervical back: Normal.  Thoracic back: Normal.       Lumbar back: Normal.       Right forearm: Normal.       Left forearm: Normal.       Right hand: She exhibits tenderness, laceration and swelling. She exhibits normal range of motion, no bony tenderness, normal two-point discrimination, normal capillary refill and no deformity. Normal sensation noted. Normal strength noted.       Left hand: Normal.       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  Neurological: She is alert and oriented to person, place, and time. She has normal strength. She is not disoriented. She displays no atrophy and no tremor. No cranial nerve deficit or sensory deficit. She exhibits normal muscle tone. She displays no seizure activity. Coordination and gait normal. GCS eye subscore is 4. GCS verbal subscore is 5. GCS motor subscore is 6.  Gait sure and steady in hall; in/out of chair without difficulty  Skin: Skin is warm and dry. Laceration and rash noted. No abrasion, no bruising, no burn, no ecchymosis, no lesion, no petechiae and no purpura noted. Rash is macular. Rash is not papular, not maculopapular, not nodular, not pustular, not vesicular and not urticarial. She is not diaphoretic. There is erythema. No cyanosis. No pallor. Nails show no clubbing.     Psychiatric: She has a normal mood and affect. Her speech is normal and behavior is normal. Judgment and thought content normal. Cognition and memory are normal.  Nursing note and vitals reviewed.         Assessment & Plan:  A-finger lacerations without nail damage right 3rd and 5th initial encounter  P-Patient was instructed to rest, ice and elevate right hand. Do not soak hand until lacerations healed avoid pool, lake, hot tub, dirty sink water. Exitcare handout on laceration  given to patient. Medications as directed. Topical antibiotic apply to affected area BID after washing with soap and water cover with bandaid until healed.  Given 4 UD triple antibiotic packages from clinic stock for home/work use.   Call or return to clinic as needed if these symptoms worsen or fail to improve as anticipated e.g. Purulent discharge, worsening pain, fever. Patient verbalized agreement and understanding of treatment plan and had no further questions at this time. P2: ROM, injury prevention

## 2017-04-13 ENCOUNTER — Ambulatory Visit
Admission: RE | Admit: 2017-04-13 | Discharge: 2017-04-13 | Disposition: A | Discharge status: Home / Self Care | Payer: No Typology Code available for payment source | Source: Ambulatory Visit | Attending: Family Medicine | Admitting: Family Medicine

## 2017-04-13 ENCOUNTER — Other Ambulatory Visit: Payer: Self-pay | Admitting: Family Medicine

## 2017-04-13 DIAGNOSIS — N950 Postmenopausal bleeding: Secondary | ICD-10-CM

## 2017-04-13 DIAGNOSIS — Z1231 Encounter for screening mammogram for malignant neoplasm of breast: Secondary | ICD-10-CM

## 2017-04-13 DIAGNOSIS — Z0000 Encounter for general adult medical examination without abnormal findings: Secondary | ICD-10-CM

## 2017-04-13 DIAGNOSIS — R9389 Abnormal findings on diagnostic imaging of other specified body structures: Secondary | ICD-10-CM

## 2017-04-13 NOTE — Progress Notes (Signed)
Menses stopped May 2017 then spotting in April and May 2018, none since. 04/13/2017 tv/pelvic US shows slightly thickened 38mm endometrium, recs biopsy

## 2017-04-18 ENCOUNTER — Ambulatory Visit (INDEPENDENT_AMBULATORY_CARE_PROVIDER_SITE_OTHER): Payer: PRIVATE HEALTH INSURANCE | Admitting: Obstetrics and Gynecology

## 2017-04-18 ENCOUNTER — Encounter: Payer: Self-pay | Admitting: Obstetrics and Gynecology

## 2017-04-18 VITALS — BP 110/60 | HR 80 | Resp 16 | Ht 70.0 in | Wt 219.0 lb

## 2017-04-18 DIAGNOSIS — F419 Anxiety disorder, unspecified: Secondary | ICD-10-CM | POA: Diagnosis not present

## 2017-04-18 DIAGNOSIS — N950 Postmenopausal bleeding: Secondary | ICD-10-CM

## 2017-04-18 DIAGNOSIS — R9389 Abnormal findings on diagnostic imaging of other specified body structures: Secondary | ICD-10-CM | POA: Diagnosis not present

## 2017-04-18 NOTE — Progress Notes (Signed)
51 y.o. [id number] MarriedCaucasianF here for a consultation from Dr Brigitte Pulse for evaluation of a thickened endometrial stripe. Ultrasound last week showed a normal sized uterus with an endometrial stripe of 6 mm. Adnexa were not seen.  Recent normal pap.  She had monthly cycles until early 2017. She would have been a whole year without a cycle in May. In early May she spotted for a few days, then in June she had a few days of spotting. No pain.  She has been having hot flashes and night sweats. Overall they are tolerable. Some sleep issues (she has a sleeping pill).  Not sexually active, husband with ED.  She takes care of her elderly mother (raised her, actually her paternal Aunt) every weekend (sleeps on the couch in her mothers room). She is exhausted and stressed. Intermittently sad, doesn't think she is depressed. She is having anxiety at work.   Period Duration (Days): no cycle since June 2018  Period Pattern: (!) Irregular Menstrual Flow: Light Menstrual Control: Panty liner Dysmenorrhea: None  Patient's last menstrual period was 12/08/2016 (approximate).          Sexually active: No.  The current method of family planning is tubal ligation.    Exercising: Yes.    walking  Smoker:  no  Health Maintenance: Pap:  02-15-17 WNL NEG HR HPV  History of abnormal Pap:  no MMG:  04-13-17 WNL  Colonoscopy:  Never BMD:   Never TDaP:  02-15-17 Gardasil: No   reports that she has never smoked. She has never used smokeless tobacco. She reports that she drinks about 0.5 - 1.0 oz of alcohol per week . She reports that she does not use drugs. 2 grown kids, no grand kids. She works in a [employer].   Past Medical History:  Diagnosis Date  . Anemia   . Anxiety   . Asthma   . Diabetes mellitus type II   . Hyperlipidemia   . Hypertension   . NSVD (normal spontaneous vaginal delivery)    x 2    Past Surgical History:  Procedure Laterality Date  . 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  History of Abd. ultrasound  12/04   negative  . Surg. eval lap cholecstectomy  05/28/2006   deferred by patient  . TUBAL LIGATION  1993   bilateral    Current Outpatient Prescriptions  Medication Sig Dispense Refill  . albuterol (VENTOLIN HFA) 108 (90 Base) MCG/ACT inhaler INHALE 2 PUFFS BY MOUTH INTO THE LUNGS EVERY 6 HOURS AS NEEDED WHEEZING OR SHORTNESS OF BREATH 18 g 5  . aspirin EC 81 MG tablet Take 1 tablet (81 mg total) by mouth daily.    Marland Kitchen atorvastatin (LIPITOR) 10 MG tablet TAKE 1 TABLET(10 MG) BY MOUTH DAILY 90 tablet 1  . Azelastine-Fluticasone (DYMISTA) 137-50 MCG/ACT SUSP Place into the nose.    Marland Kitchen BREO ELLIPTA 100-25 MCG/INH AEPB INL 1 PUFF PO QD  3  . cetirizine (ZYRTEC) 10 MG tablet Take 10 mg by mouth daily.    Marland Kitchen Epinastine HCl 0.05 % ophthalmic solution Place 1 drop into both eyes 2 (two) times daily. 5 mL 2  . ferrous sulfate 325 (65 FE) MG tablet Take 325 mg by mouth at bedtime.      Marland Kitchen levocetirizine (XYZAL) 5 MG tablet Take 1 tablet by mouth daily.  9  . lisinopril (PRINIVIL,ZESTRIL) 10 MG tablet TAKE 1 TABLET BY MOUTH EVERY NIGHT AT BEDTIME 90 tablet 3  . MAGNESIUM  PO Take by mouth.    . metFORMIN (GLUCOPHAGE) 1000 MG tablet TAKE 1 TABLET(1000 MG) BY MOUTH TWICE DAILY WITH A MEAL 180 tablet 3  . montelukast (SINGULAIR) 10 MG tablet Take 1 tablet (10 mg total) by mouth at bedtime. 90 tablet 3  . Multiple Vitamins-Minerals (CENTRUM ADULTS PO) Take by mouth.    . neomycin-bacitracin-polymyxin (NEOSPORIN) [phone] ointment Apply topically 2 (two) times daily. 28.3 g 0  . traZODone (DESYREL) 50 MG tablet Take 1 tablet (50 mg total) by mouth at bedtime. 90 tablet 1  . vitamin C (ASCORBIC ACID) 500 MG tablet Take 500 mg by mouth daily.     No current facility-administered medications for this visit.     Family History  Problem Relation Age of Onset  . Cancer Mother        breast  . Breast cancer Mother   . Diabetes Maternal Grandmother   Father committed suicide  before she was born. She has one biological brother.   Review of Systems  Constitutional: Negative.   HENT: Negative.   Eyes: Negative.   Respiratory: Negative.   Cardiovascular: Negative.   Gastrointestinal: Negative.   Endocrine: Negative.   Genitourinary: Negative.   Musculoskeletal: Negative.   Skin: Negative.   Allergic/Immunologic: Negative.   Neurological: Negative.   Psychiatric/Behavioral: Negative.     Exam:   BP 110/60 (BP Location: Right Arm, Patient Position: Sitting, Cuff Size: Normal)   Pulse 80   Resp 16   Ht 5\' 10"  (1.778 m)   Wt 219 lb (99.3 kg)   LMP 12/08/2016 (Approximate)   BMI 31.42 kg/m   Weight change: @WEIGHTCHANGE @ Height:   Height: 5\' 10"  (177.8 cm)  Ht Readings from Last 3 Encounters:  04/18/17 5\' 10"  (1.778 m)  03/26/17 5\' 10"  (1.778 m)  02/15/17 5\' 10"  (1.778 m)    General appearance: alert, cooperative and appears stated age. Tearful when talking about her life.  Head: Normocephalic, without obvious abnormality, atraumatic Neck: no adenopathy, supple, symmetrical, trachea midline and thyroid normal to inspection and palpation Lungs: clear to auscultation bilaterally Cardiovascular: regular rate and rhythm Abdomen: soft, non-tender; non distended,  no masses,  no organomegaly Extremities: extremities normal, atraumatic, no cyanosis or edema Skin: Skin color, texture, turgor normal. No rashes or lesions Lymph nodes: Cervical, supraclavicular, and axillary nodes normal. No abnormal inguinal nodes palpated Neurologic: Grossly normal   Pelvic: External genitalia:  no lesions              Urethra:  normal appearing urethra with no masses, tenderness or lesions              Bartholins and Skenes: normal                 Vagina: normal appearing vagina with normal color and discharge, no lesions              Cervix: no lesions               Bimanual Exam:  Uterus:  normal size, contour, position, consistency, mobility, non-tender and  anteverted              Adnexa: no mass, fullness, tenderness                 The risks of endometrial biopsy were reviewed and a consent was obtained.  A speculum was placed in the vagina and the cervix was cleansed with betadine. The mini-pipelle was placed into the endometrial cavity. The uterus  sounded to 7 cm. The endometrial biopsy was performed, minimal tissue was obtained. The tenaculum and speculum were removed. There were no complications.   Chaperone was present for exam.  A:  Postmenopausal bleeding  Under stress at work and with taking care of her sick mother, anxiety, intermittently sad  P:   Endometrial biopsy done, minimal tissue, discussed the possibility of an endometrial polyp   Further plans after the biopsy returns  I recommended that she talk with her primary about her anxiety. We briefly discussed the benefits of SSRI's   CC: Dr [name] Note sent

## 2017-04-18 NOTE — Patient Instructions (Signed)

## 2017-04-18 NOTE — Addendum Note (Signed)
Addended by: Dorothy Spark on: 04/18/2017 10:39 AM   Modules accepted: Orders

## 2017-04-20 ENCOUNTER — Other Ambulatory Visit: Payer: Self-pay | Admitting: Obstetrics and Gynecology

## 2017-04-20 DIAGNOSIS — R9389 Abnormal findings on diagnostic imaging of other specified body structures: Secondary | ICD-10-CM

## 2017-04-20 DIAGNOSIS — N950 Postmenopausal bleeding: Secondary | ICD-10-CM

## 2017-05-01 ENCOUNTER — Encounter: Payer: Self-pay | Admitting: Obstetrics and Gynecology

## 2017-05-01 ENCOUNTER — Ambulatory Visit (INDEPENDENT_AMBULATORY_CARE_PROVIDER_SITE_OTHER): Payer: PRIVATE HEALTH INSURANCE

## 2017-05-01 ENCOUNTER — Ambulatory Visit (INDEPENDENT_AMBULATORY_CARE_PROVIDER_SITE_OTHER): Payer: PRIVATE HEALTH INSURANCE | Admitting: Obstetrics and Gynecology

## 2017-05-01 VITALS — BP 104/60 | HR 64 | Resp 14 | Wt 219.0 lb

## 2017-05-01 DIAGNOSIS — N950 Postmenopausal bleeding: Secondary | ICD-10-CM

## 2017-05-01 DIAGNOSIS — R9389 Abnormal findings on diagnostic imaging of other specified body structures: Secondary | ICD-10-CM | POA: Diagnosis not present

## 2017-05-01 NOTE — Progress Notes (Signed)
GYNECOLOGY  VISIT   HPI: 51 y.o.   Married  Caucasian  female   G2P2002 with No LMP recorded. Patient is not currently having periods (Reason: Perimenopausal).   here for follow up for PMB, recent biopsy with atrophic endometrium. Last episode of spotting was in June, 2018.  GYNECOLOGIC HISTORY: No LMP recorded. Patient is not currently having periods (Reason: Perimenopausal). Contraception:postmeopause Menopausal hormone therapy: none        OB History    Gravida Para Term Preterm AB Living   2 2 2     2    SAB TAB Ectopic Multiple Live Births           2         Patient Active Problem List   Diagnosis Date Noted  . Asthma 09/20/2016  . Chronic seasonal allergic rhinitis 09/20/2016  . Anemia, iron deficiency 08/07/2014  . Allergic reaction to chemical substance 08/07/2014  . Muscle cramp, nocturnal 08/07/2014  . Essential hypertension, benign 12/07/2012  . Polypharmacy 12/07/2012  . Ganglion cyst 10/22/2012  . Hypercholesteremia 05/04/2011  . Anxiety and depression 02/01/2011  . EDEMA 03/04/2009  . Allergic state 11/16/2006  . Diabetes mellitus, type II ([place]) 10/25/2006    Past Medical History:  Diagnosis Date  . Anemia   . Anxiety   . Asthma   . Diabetes mellitus type II   . Hyperlipidemia   . Hypertension   . NSVD (normal spontaneous vaginal delivery)    x 2    Past Surgical History:  Procedure Laterality Date  . 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 History of Abd. ultrasound  12/04   negative  . Surg. eval lap cholecstectomy  05/28/2006   deferred by patient  . TUBAL LIGATION  1993   bilateral    Current Outpatient Prescriptions  Medication Sig Dispense Refill  . albuterol (VENTOLIN HFA) 108 (90 Base) MCG/ACT inhaler INHALE 2 PUFFS BY MOUTH INTO THE LUNGS EVERY 6 HOURS AS NEEDED WHEEZING OR SHORTNESS OF BREATH 18 g 5  . aspirin EC 81 MG tablet Take 1 tablet (81 mg total) by mouth daily.    Marland Kitchen atorvastatin (LIPITOR) 10 MG tablet TAKE 1  TABLET(10 MG) BY MOUTH DAILY 90 tablet 1  . Azelastine-Fluticasone (DYMISTA) 137-50 MCG/ACT SUSP Place into the nose.    Marland Kitchen BREO ELLIPTA 100-25 MCG/INH AEPB INL 1 PUFF PO QD  3  . cetirizine (ZYRTEC) 10 MG tablet Take 10 mg by mouth daily.    Marland Kitchen Epinastine HCl 0.05 % ophthalmic solution Place 1 drop into both eyes 2 (two) times daily. 5 mL 2  . ferrous sulfate 325 (65 FE) MG tablet Take 325 mg by mouth at bedtime.      Marland Kitchen levocetirizine (XYZAL) 5 MG tablet Take 1 tablet by mouth daily.  9  . lisinopril (PRINIVIL,ZESTRIL) 10 MG tablet TAKE 1 TABLET BY MOUTH EVERY NIGHT AT BEDTIME 90 tablet 3  . MAGNESIUM PO Take by mouth.    . metFORMIN (GLUCOPHAGE) 1000 MG tablet TAKE 1 TABLET(1000 MG) BY MOUTH TWICE DAILY WITH A MEAL 180 tablet 3  . montelukast (SINGULAIR) 10 MG tablet Take 1 tablet (10 mg total) by mouth at bedtime. 90 tablet 3  . Multiple Vitamins-Minerals (CENTRUM ADULTS PO) Take by mouth.    . traZODone (DESYREL) 50 MG tablet Take 1 tablet (50 mg total) by mouth at bedtime. 90 tablet 1  . vitamin C (ASCORBIC ACID) 500 MG tablet Take 500 mg by mouth daily.  No current facility-administered medications for this visit.      ALLERGIES: Adhesive [tape] and Prednisone  Family History  Problem Relation Age of Onset  . Cancer Mother        breast  . Breast cancer Mother 39       died when she was 79  . Diabetes Maternal Grandmother     Social History   Social History  . Marital status: Married    Spouse name: N/A  . Number of children: 2  . Years of education: N/A   Occupational History  . Shipping Dept. [employer]   Social History Main Topics  . Smoking status: Never Smoker  . Smokeless tobacco: Never Used  . Alcohol use 0.5 - 1.0 oz/week    1 - 2 Standard drinks or equivalent per week     Comment: Occasional beer  . Drug use: No  . Sexual activity: No   Other Topics Concern  . Not on file   Social History Narrative   2 Sons   Works at [employer]    Married    Review of Systems  Constitutional: Negative.   HENT: Negative.   Eyes: Negative.   Respiratory: Negative.   Cardiovascular: Negative.   Gastrointestinal: Negative.   Genitourinary: Negative.   Musculoskeletal: Negative.   Skin: Negative.   Neurological: Negative.   Endo/Heme/Allergies: Negative.   Psychiatric/Behavioral: Negative.     PHYSICAL EXAMINATION:    BP 104/60 (BP Location: Right Arm, Patient Position: Sitting, Cuff Size: Normal)   Pulse 64   Resp 14   Wt 219 lb (99.3 kg)   BMI 31.42 kg/m     General appearance: alert, cooperative and appears stated age  Pelvic: External genitalia:  no lesions              Urethra:  normal appearing urethra with no masses, tenderness or lesions              Bartholins and Skenes: normal                 Vagina: normal appearing vagina with normal color and discharge, no lesions              Cervix:no lesions  Sonohysterogram The procedure and risks of the procedure were reviewed with the patient, consent form was signed. A speculum was placed in the vagina and the cervix was cleansed with Hibiclens. The sonohysterogram catheter was inserted into the uterine cavity without difficulty. Saline was infused under direct observation with the ultrasound. No intracavitary defects were noted. Unable to visualize the endocervix. The catheter was removed.    Chaperone was present for exam.  Ultrasound images were reviewed with the patient  ASSESSMENT Postmenopausal bleeding, atrophic endometrial biopsy, negative sonohysterogram (normal uterine cavity, unable to see within the endocervix)    PLAN Negative evaluation Call with further bleeding If she has persistent bleeding, would recommend hysteroscopy, D&C  An After Visit Summary was printed and given to the patient.   CC: Dr [name]

## 2017-05-03 ENCOUNTER — Other Ambulatory Visit: Payer: Self-pay | Admitting: Family Medicine

## 2017-05-08 NOTE — Progress Notes (Addendum)
Subjective:    Patient ID: Kelly Reed, female    DOB: Sep 10, 1965, 51 y.o.   MRN: [id number]   Chief Complaint  Patient presents with  . Follow-up    TIIDM, for meds     HPI  Kelly Reed is a delightful 51 y.o. woman who is here today for a 3 mo follow-up on her chronic medical conditions inc diabetes and obesity.   DMII: Diagnosed .   Lab Results  Component Value Date   HGBA1C 6.3 (H) 11/21/2016   HGBA1C 6.5 (H) 09/21/2016   HGBA1C 6.3 03/23/2016   CBGs: fasting a.m. 100-120 ; after meal  ; No hypoglycemic episodes.  Meter type:  Diet: is trying. . .  Exercising: is trying to walk more  DM Med Regimen: at last visit, pt requested that today we discuss consider adding on a glp 1 to help pt with weightloss. Has been on metformin 1g bid  for many years Prior changes:   eGFR: 100 Baseline Cr: 0.69  Last checked 11/21/16. Microalb: 09/21/16 was borderline +. Lisinopril 10 Lipids:  LDL 98->91,  non-HDL 121 ->116.  Last levels done 11/21/16  - were improving from prior. On atorvastatin 10 qhs.  Taking asa 81 qd.  Optho: Seen annually by [facility] - last exam DUE (03/2016) - will scheduled Feet: Monofilament exam done 09/21/16. Having sig pain from plantar fasciitis but now finally improving (see below)  Immunizations:  Influenza: will do today  Pneumovax-23: 2014   HLD: lipids at goal 11/2016 LDL 91 and non-HDL 116 on lipitor 10. However, lipids have been increasing over the years.      Lab Results  Component Value Date   LDLCALC 91 11/21/2016   LDLCALC 98 09/21/2016   LDLCALC 82 11/23/2015   LDLCALC 73 09/16/2015   LDLCALC 66 02/13/2015     Obesity: see above.  She is going through menopause so weight fluctuation dramatically. Would like to consider other DM med - like a GLP-1 - to help.  She feels like her weight is increasing though she is trying to walk more. BMI Readings from Last 7 Encounters:  05/01/17 31.42 kg/m  04/18/17 31.42 kg/m  03/26/17 32.14  kg/m  02/15/17 31.85 kg/m  11/21/16 32.14 kg/m  11/16/16 31.12 kg/m  09/21/16 30.75 kg/m    Anxiety: Pt under a lot of stress with work and caring for her ill mother on the weekends who lives several hours a way - travels to spend the weekends at her house.  She has noticed worsening sxs and memory - even her supervisor at work noted.  Did talk w/ gyn about it who reassured her that this was part of anxiety.    Plantar fasciitis and bone spur: Limiting exercise/activity. Seen by podiatry who did a cortisone injection which did not help at all as well as bought inserts, new shoes, splint, brace but then got charged >$300.   She is still having a ton of pain by the end of the day.  She is still doing stretches and massage but is now developing pain over the left lateral foot as well but can't afford to go back.  Much worse when on her feet all day. Started trial of top pennaid at last visit.  Has noticed a recurrence of right foot burning over the plantar metatarsals th is week.  H/o low iron: resolved after iron supplement last yr so stopped supplementing >6 mo prior.  Insomnia with leg cramps:  Takes magnesium, flexeril  5, and trazodone 50 qhs. Recently improved so was just using medications when staying with her mother (who lives sev hrs away) on the weekends.  Seasonal allergies and asthma:  Seeing allergist for allergy shots which has helped. Moderate-severe recurrent problem- worse seasonally and with dust/other enivronmental exposures with freq flaires. Has failed allotc allergy eye drops, patanol, pataday, and otc cough meds so has to be on bepreve (bepotastine 1.5% but costs 100$ for 5 ml) so tried pt on epinastine 0.05% bid at last visit. On zyrtec 10, singulair 44m qhs, nasonex 2 sprays qd, azelastine 0.15% 1 spray bid, Breo Ellipta 200-25 1 puff qd, and prn albuterol.   Past Medical History:  Diagnosis Date  . Anemia   . Anxiety   . Asthma   . Diabetes mellitus type II   .  Hyperlipidemia   . Hypertension   . NSVD (normal spontaneous vaginal delivery)    x 2   Past Surgical History:  Procedure Laterality Date  . 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 History of Abd. ultrasound  12/04   negative  . Surg. eval lap cholecstectomy  05/28/2006   deferred by patient  . TUBAL LIGATION  1993   bilateral   Current Outpatient Prescriptions on File Prior to Visit  Medication Sig Dispense Refill  . albuterol (VENTOLIN HFA) 108 (90 Base) MCG/ACT inhaler INHALE 2 PUFFS BY MOUTH INTO THE LUNGS EVERY 6 HOURS AS NEEDED WHEEZING OR SHORTNESS OF BREATH 18 g 5  . aspirin EC 81 MG tablet Take 1 tablet (81 mg total) by mouth daily.    .Marland Kitchenatorvastatin (LIPITOR) 10 MG tablet TAKE 1 TABLET(10 MG) BY MOUTH DAILY 90 tablet 1  . Azelastine-Fluticasone (DYMISTA) 137-50 MCG/ACT SUSP Place into the nose.    .Marland KitchenBREO ELLIPTA 100-25 MCG/INH AEPB INL 1 PUFF PO QD  3  . cetirizine (ZYRTEC) 10 MG tablet Take 10 mg by mouth daily.    .Marland KitchenEpinastine HCl 0.05 % ophthalmic solution Place 1 drop into both eyes 2 (two) times daily. 5 mL 2  . ferrous sulfate 325 (65 FE) MG tablet Take 325 mg by mouth at bedtime.      .Marland Kitchenlevocetirizine (XYZAL) 5 MG tablet Take 1 tablet by mouth daily.  9  . MAGNESIUM PO Take by mouth.    . metFORMIN (GLUCOPHAGE) 1000 MG tablet TAKE 1 TABLET(1000 MG) BY MOUTH TWICE DAILY WITH A MEAL 180 tablet 0  . montelukast (SINGULAIR) 10 MG tablet Take 1 tablet (10 mg total) by mouth at bedtime. 90 tablet 3  . Multiple Vitamins-Minerals (CENTRUM ADULTS PO) Take by mouth.    . traZODone (DESYREL) 50 MG tablet Take 1 tablet (50 mg total) by mouth at bedtime. 90 tablet 1  . vitamin C (ASCORBIC ACID) 500 MG tablet Take 500 mg by mouth daily.     No current facility-administered medications on file prior to visit.    Allergies  Allergen Reactions  . Adhesive [Tape] Rash and Other (See Comments)    Peeling of skin  . Prednisone     Hyperglycemia with oral steroids    Family History  Problem Relation Age of Onset  . Cancer Mother        breast  . Breast cancer Mother 370      died when she was 356 . Diabetes Maternal Grandmother    Social History   Social History  . Marital status: Married    Spouse name: N/A  .  Number of children: 2  . Years of education: N/A   Occupational History  . Shipping Dept. [employer]   Social History Main Topics  . Smoking status: Never Smoker  . Smokeless tobacco: Never Used  . Alcohol use 0.5 - 1.0 oz/week    1 - 2 Standard drinks or equivalent per week     Comment: Occasional beer  . Drug use: No  . Sexual activity: No   Other Topics Concern  . None   Social History Narrative   2 Sons   Works at [employer]   Married   Depression screen [facility] 2/9 05/10/2017 03/26/2017 02/15/2017 11/16/2016 09/21/2016  Decreased Interest 0 0 0 0 0  Down, Depressed, Hopeless 0 0 0 0 0  PHQ - 2 Score 0 0 0 0 0    Review of Systems  Constitutional: Positive for diaphoresis.  Endocrine: Positive for heat intolerance.  Psychiatric/Behavioral: Positive for agitation, behavioral problems, decreased concentration, dysphoric mood and sleep disturbance. The patient is nervous/anxious.        Objective:   Physical Exam  Constitutional: She is oriented to person, place, and time. She appears well-developed and well-nourished. No distress.  HENT:  Head: Normocephalic and atraumatic.  Right Ear: External ear normal.  Left Ear: External ear normal.  Eyes: Conjunctivae are normal. No scleral icterus.  Neck: Normal range of motion. Neck supple. No thyromegaly present.  Cardiovascular: Normal rate, regular rhythm, normal heart sounds and intact distal pulses.   Pulmonary/Chest: Effort normal and breath sounds normal. No respiratory distress.  Musculoskeletal: She exhibits no edema.  Lymphadenopathy:    She has no cervical adenopathy.  Neurological: She is alert and oriented to person, place, and time.  Skin: Skin  is warm and dry. She is not diaphoretic. No erythema.  Psychiatric: She has a normal mood and affect. Her behavior is normal.      Pulse 74   Temp 97.7 F (36.5 C)   Resp 16   Ht 5' 10"  (1.778 m)   Wt 218 lb 12.8 oz (99.2 kg)   SpO2 99%   BMI 31.39 kg/m     EKG: NSR, no acute ischemic changes noted. No prior EKG available for comparison.   I have personally reviewed the EKG tracing and agree with the computer interpretation with the exception of "abnormal". Sinus  Rhythm  Low voltage in limb leads.   ABNORMAL  Assessment & Plan:  ??add on glp-1 to help w/ weight loss A1c, cmp, ekg If no longer iron supp - check ferritin  - it is normal  1. Type 2 diabetes mellitus without complication, without long-term current use of insulin (HCC) - change lisnopril to losartan due to cough.  2. Anxiety and depression - start effexor, recheck in 6 wks  3. Iron deficiency anemia, unspecified iron deficiency anemia type - resolved.  4. Medication monitoring encounter   5. Class 1 obesity due to excess calories with serious comorbidity and body mass index (BMI) of 31.0 to 31.9 in adult   6. Need for prophylactic vaccination and inoculation against influenza     Orders Placed This Encounter  Procedures  . Flu Vaccine QUAD 36+ mos IM  . Comprehensive metabolic panel  . Ferritin  . Care order/instruction:    AVS printed - let patient go!  Marland Kitchen POCT glycosylated hemoglobin (Hb A1C)  . EKG 12-Lead    Meds ordered this encounter  Medications  . venlafaxine XR (EFFEXOR-XR) 37.5 MG 24 hr capsule  Sig: Take 1 capsule (37.5 mg total) by mouth daily with breakfast.    Dispense:  30 capsule    Refill:  2  . losartan (COZAAR) 25 MG tablet    Sig: Take 1 tablet (25 mg total) by mouth daily.    Dispense:  90 tablet    Refill:  1    [name], M.D.  Primary Care at [facility] [address] [place], [place] [address] [phone] phone [phone] fax  05/11/17 1:41 AM

## 2017-05-10 ENCOUNTER — Ambulatory Visit (INDEPENDENT_AMBULATORY_CARE_PROVIDER_SITE_OTHER): Payer: PRIVATE HEALTH INSURANCE | Admitting: Family Medicine

## 2017-05-10 ENCOUNTER — Encounter: Payer: Self-pay | Admitting: Family Medicine

## 2017-05-10 VITALS — HR 74 | Temp 97.7°F | Resp 16 | Ht 70.0 in | Wt 218.8 lb

## 2017-05-10 DIAGNOSIS — Z6831 Body mass index (BMI) 31.0-31.9, adult: Secondary | ICD-10-CM | POA: Diagnosis not present

## 2017-05-10 DIAGNOSIS — Z5181 Encounter for therapeutic drug level monitoring: Secondary | ICD-10-CM

## 2017-05-10 DIAGNOSIS — D509 Iron deficiency anemia, unspecified: Secondary | ICD-10-CM

## 2017-05-10 DIAGNOSIS — Z23 Encounter for immunization: Secondary | ICD-10-CM

## 2017-05-10 DIAGNOSIS — F329 Major depressive disorder, single episode, unspecified: Secondary | ICD-10-CM | POA: Diagnosis not present

## 2017-05-10 DIAGNOSIS — F419 Anxiety disorder, unspecified: Secondary | ICD-10-CM | POA: Diagnosis not present

## 2017-05-10 DIAGNOSIS — E6609 Other obesity due to excess calories: Secondary | ICD-10-CM | POA: Diagnosis not present

## 2017-05-10 DIAGNOSIS — E119 Type 2 diabetes mellitus without complications: Secondary | ICD-10-CM | POA: Diagnosis not present

## 2017-05-10 LAB — POCT GLYCOSYLATED HEMOGLOBIN (HGB A1C): Hemoglobin A1C: 7.1

## 2017-05-10 MED ORDER — VENLAFAXINE HCL ER 37.5 MG PO CP24: 37.5000 mg | ORAL_CAPSULE | Freq: Every day | ORAL | 2 refills | Status: DC

## 2017-05-10 MED ORDER — LOSARTAN POTASSIUM 25 MG PO TABS: 25.0000 mg | ORAL_TABLET | Freq: Every day | ORAL | 1 refills | Status: DC

## 2017-05-10 NOTE — Patient Instructions (Addendum)
Your a1c is increased so at your next visit we may want to think about adding in one of the once weekly injectables or one of the daily pills that help you pee out sugar.   IF you received an x-ray today, you will receive an invoice from [facility]. Please contact [facility] at [phone] with questions or concerns regarding your invoice.   IF you received labwork today, you will receive an invoice from [facility]. Please contact [facility] at [phone] with questions or concerns regarding your invoice.   Our billing staff will not be able to assist you with questions regarding bills from these companies.  You will be contacted with the lab results as soon as they are available. The fastest way to get your results is to activate your My Chart account. Instructions are located on the last page of this paperwork. If you have not heard from [place] regarding the results in 2 weeks, please contact this office.    Menopause and Herbal Products What is menopause? Menopause is the normal time of life when menstrual periods decrease in frequency and eventually stop completely. This process can take several years for some women. Menopause is complete when you have had an absence of menstruation for a full year since your last menstrual period. It usually occurs between the ages of 38 and 15. It is not common for menopause to begin before the age of 41. During menopause, your body stops producing the female hormones estrogen and progesterone. Common symptoms associated with this loss of hormones (vasomotor symptoms) are:  Hot flashes.  Hot flushes.  Night sweats.  Other common symptoms and complications of menopause include:  Decrease in sex drive.  Vaginal dryness and thinning of the walls of the vagina. This can make sex painful.  Dryness of the skin and development of wrinkles.  Headaches.  Tiredness.  Irritability.  Memory problems.  Weight gain.  Bladder  infections.  Hair growth on the face and chest.  Inability to reproduce offspring (infertility).  Loss of density in the bones (osteoporosis) increasing your risk for breaks (fractures).  Depression.  Hardening and narrowing of the arteries (atherosclerosis). This increases your risk of heart attack and stroke.  What treatment options are available? There are many treatment choices for menopause symptoms. The most common treatment is hormone replacement therapy. Many alternative therapies for menopause are emerging, including the use of herbal products. These supplements can be found in the form of herbs, teas, oils, tinctures, and pills. Common herbal supplements for menopause are made from plants that contain phytoestrogens. Phytoestrogens are compounds that occur naturally in plants and plant products. They act like estrogen in the body. Foods and herbs that contain phytoestrogens include:  Soy.  Flax seeds.  Red clover.  Ginseng.  What menopause symptoms may be helped if I use herbal products?  Vasomotor symptoms. These may be helped by: ? Soy. Some studies show that soy may have a moderate benefit for hot flashes. ? Black cohosh. There is limited evidence indicating this may be beneficial for hot flashes.  Symptoms that are related to heart and blood vessel disease. These may be helped by soy. Studies have shown that soy can help to lower cholesterol.  Depression. This may be helped by: ? St. John's wort. There is limited evidence that shows this may help mild to moderate depression. ? Black cohosh. There is evidence that this may help depression and mood swings.  Osteoporosis. Soy may help to decrease bone loss that  is associated with menopause and may prevent osteoporosis. Limited evidence indicates that red clover may offer some bone loss protection as well. Other herbal products that are commonly used during menopause lack enough evidence to support their use as a  replacement for conventional menopause therapies. These products include evening primrose, ginseng, and red clover. What are the cases when herbal products should not be used during menopause? Do not use herbal products during menopause without your health care provider's approval if:  You are taking medicine.  You have a preexisting liver condition.  Are there any risks in my taking herbal products during menopause? If you choose to use herbal products to help with symptoms of menopause, keep in mind that:  Different supplements have different and unmeasured amounts of herbal ingredients.  Herbal products are not regulated the same way that medicines are.  Concentrations of herbs may vary depending on the way they are prepared. For example, the concentration may be different in a pill, tea, oil, and tincture.  Little is known about the risks of using herbal products, particularly the risks of long-term use.  Some herbal supplements can be harmful when combined with certain medicines.  Most commonly reported side effects of herbal products are mild. However, if used improperly, many herbal supplements can cause serious problems. Talk to your health care provider before starting any herbal product. If problems develop, stop taking the supplement and let your health care provider know. This information is not intended to replace advice given to you by your health care provider. Make sure you discuss any questions you have with your health care provider. Document Released: 12/13/2007 Document Revised: 05/23/2016 Document Reviewed: 12/09/2013 Elsevier Interactive Patient Education  2017 [employer].   Menopause Menopause is the normal time of life when menstrual periods stop completely. Menopause is complete when you have missed 12 consecutive menstrual periods. It usually occurs between the ages of 39 years and 15 years. Very rarely does a woman develop menopause before the age of 61 years.  At menopause, your ovaries stop producing the female hormones estrogen and progesterone. This can cause undesirable symptoms and also affect your health. Sometimes the symptoms may occur 4-5 years before the menopause begins. There is no relationship between menopause and:  Oral contraceptives.  Number of children you had.  Race.  The age your menstrual periods started (menarche).  Heavy smokers and very thin women may develop menopause earlier in life. What are the causes?  The ovaries stop producing the female hormones estrogen and progesterone. Other causes include:  Surgery to remove both ovaries.  The ovaries stop functioning for no known reason.  Tumors of the pituitary gland in the brain.  Medical disease that affects the ovaries and hormone production.  Radiation treatment to the abdomen or pelvis.  Chemotherapy that affects the ovaries.  What are the signs or symptoms?  Hot flashes.  Night sweats.  Decrease in sex drive.  Vaginal dryness and thinning of the vagina causing painful intercourse.  Dryness of the skin and developing wrinkles.  Headaches.  Tiredness.  Irritability.  Memory problems.  Weight gain.  Bladder infections.  Hair growth of the face and chest.  Infertility. More serious symptoms include:  Loss of bone (osteoporosis) causing breaks (fractures).  Depression.  Hardening and narrowing of the arteries (atherosclerosis) causing heart attacks and strokes.  How is this diagnosed?  When the menstrual periods have stopped for 12 straight months.  Physical exam.  Hormone studies of the blood. How  is this treated? There are many treatment choices and nearly as many questions about them. The decisions to treat or not to treat menopausal changes is an individual choice made with your health care provider. Your health care provider can discuss the treatments with you. Together, you can decide which treatment will work best for you.  Your treatment choices may include:  Hormone therapy (estrogen and progesterone).  Non-hormonal medicines.  Treating the individual symptoms with medicine (for example antidepressants for depression).  Herbal medicines that may help specific symptoms.  Counseling by a psychiatrist or psychologist.  Group therapy.  Lifestyle changes including: ? Eating healthy. ? Regular exercise. ? Limiting caffeine and alcohol. ? Stress management and meditation.  No treatment.  Follow these instructions at home:  Take the medicine your health care provider gives you as directed.  Get plenty of sleep and rest.  Exercise regularly.  Eat a diet that contains calcium (good for the bones) and soy products (acts like estrogen hormone).  Avoid alcoholic beverages.  Do not smoke.  If you have hot flashes, dress in layers.  Take supplements, calcium, and vitamin D to strengthen bones.  You can use over-the-counter lubricants or moisturizers for vaginal dryness.  Group therapy is sometimes very helpful.  Acupuncture may be helpful in some cases. Contact a health care provider if:  You are not sure you are in menopause.  You are having menopausal symptoms and need advice and treatment.  You are still having menstrual periods after age 33 years.  You have pain with intercourse.  Menopause is complete (no menstrual period for 12 months) and you develop vaginal bleeding.  You need a referral to a specialist (gynecologist, psychiatrist, or psychologist) for treatment. Get help right away if:  You have severe depression.  You have excessive vaginal bleeding.  You fell and think you have a broken bone.  You have pain when you urinate.  You develop leg or chest pain.  You have a fast pounding heart beat (palpitations).  You have severe headaches.  You develop vision problems.  You feel a lump in your breast.  You have abdominal pain or severe indigestion. This  information is not intended to replace advice given to you by your health care provider. Make sure you discuss any questions you have with your health care provider. Document Released: 09/16/2003 Document Revised: 12/02/2015 Document Reviewed: 01/23/2013 Elsevier Interactive Patient Education  2017 [employer].

## 2017-05-11 LAB — COMPREHENSIVE METABOLIC PANEL
A/G RATIO: 2.3 — AB (ref 1.2–2.2)
ALK PHOS: 70 IU/L (ref 39–117)
ALT: 39 IU/L — ABNORMAL HIGH (ref 0–32)
AST: 24 IU/L (ref 0–40)
Albumin: 4.9 g/dL (ref 3.5–5.5)
BUN / CREAT RATIO: 19 (ref 9–23)
BUN: 13 mg/dL (ref 6–24)
Bilirubin Total: 0.4 mg/dL (ref 0.0–1.2)
CO2: 23 mmol/L (ref 20–29)
Calcium: 9.5 mg/dL (ref 8.7–10.2)
Chloride: 102 mmol/L (ref 96–106)
Creatinine, Ser: 0.68 mg/dL (ref 0.57–1.00)
GFR calc Af Amer: 115 mL/min/{1.73_m2} (ref >59)
GFR calc non Af Amer: 100 mL/min/{1.73_m2} (ref >59)
GLOBULIN, TOTAL: 2.1 g/dL (ref 1.5–4.5)
Glucose: 135 mg/dL — ABNORMAL HIGH (ref 65–99)
POTASSIUM: 4.3 mmol/L (ref 3.5–5.2)
SODIUM: 142 mmol/L (ref 134–144)
Total Protein: 7 g/dL (ref 6.0–8.5)

## 2017-05-11 LAB — FERRITIN: FERRITIN: 74 ng/mL (ref 15–150)

## 2017-06-21 ENCOUNTER — Other Ambulatory Visit: Payer: Self-pay

## 2017-06-21 ENCOUNTER — Ambulatory Visit: Payer: PRIVATE HEALTH INSURANCE | Admitting: Family Medicine

## 2017-06-21 ENCOUNTER — Encounter: Payer: Self-pay | Admitting: Family Medicine

## 2017-06-21 ENCOUNTER — Other Ambulatory Visit: Payer: Self-pay | Admitting: Family Medicine

## 2017-06-21 VITALS — BP 140/78 | HR 88 | Temp 97.8°F | Resp 16 | Ht 70.0 in | Wt 216.2 lb

## 2017-06-21 DIAGNOSIS — F419 Anxiety disorder, unspecified: Secondary | ICD-10-CM | POA: Diagnosis not present

## 2017-06-21 DIAGNOSIS — E6609 Other obesity due to excess calories: Secondary | ICD-10-CM

## 2017-06-21 DIAGNOSIS — F329 Major depressive disorder, single episode, unspecified: Secondary | ICD-10-CM

## 2017-06-21 DIAGNOSIS — E119 Type 2 diabetes mellitus without complications: Secondary | ICD-10-CM | POA: Diagnosis not present

## 2017-06-21 DIAGNOSIS — Z6831 Body mass index (BMI) 31.0-31.9, adult: Secondary | ICD-10-CM

## 2017-06-21 MED ORDER — METFORMIN HCL 1000 MG PO TABS: ORAL_TABLET | ORAL | 3 refills | Status: DC

## 2017-06-21 MED ORDER — VENLAFAXINE HCL ER 75 MG PO CP24: 75.0000 mg | ORAL_CAPSULE | Freq: Every day | ORAL | 0 refills | Status: DC

## 2017-06-21 MED ORDER — TRAZODONE HCL 50 MG PO TABS: 50.0000 mg | ORAL_TABLET | Freq: Every day | ORAL | 1 refills | Status: DC

## 2017-06-21 NOTE — Progress Notes (Signed)
Subjective:    Patient ID: Kelly Reed, female    DOB: Oct 24, 1965, 51 y.o.   MRN: [id number] Chief Complaint  Patient presents with  . Follow-up    TIIDM   . Medication Refill    metformin, trazadone    HPI  At first had some small appetite suppression though that stpped and feels like she has lost inches. Lots of family problems.    Past Medical History:  Diagnosis Date  . Anemia   . Anxiety   . Asthma   . Diabetes mellitus type II   . Hyperlipidemia   . Hypertension   . NSVD (normal spontaneous vaginal delivery)    x 2   Past Surgical History:  Procedure Laterality Date  . 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 History of Abd. ultrasound  12/04   negative  . Surg. eval lap cholecstectomy  05/28/2006   deferred by patient  . TUBAL LIGATION  1993   bilateral   Current Outpatient Medications on File Prior to Visit  Medication Sig Dispense Refill  . albuterol (VENTOLIN HFA) 108 (90 Base) MCG/ACT inhaler INHALE 2 PUFFS BY MOUTH INTO THE LUNGS EVERY 6 HOURS AS NEEDED WHEEZING OR SHORTNESS OF BREATH 18 g 5  . aspirin EC 81 MG tablet Take 1 tablet (81 mg total) by mouth daily.    Marland Kitchen atorvastatin (LIPITOR) 10 MG tablet TAKE 1 TABLET(10 MG) BY MOUTH DAILY 90 tablet 1  . Azelastine-Fluticasone (DYMISTA) 137-50 MCG/ACT SUSP Place into the nose.    Marland Kitchen BREO ELLIPTA 100-25 MCG/INH AEPB INL 1 PUFF PO QD  3  . cetirizine (ZYRTEC) 10 MG tablet Take 10 mg by mouth daily.    Marland Kitchen Epinastine HCl 0.05 % ophthalmic solution Place 1 drop into both eyes 2 (two) times daily. 5 mL 2  . ferrous sulfate 325 (65 FE) MG tablet Take 325 mg by mouth at bedtime.      Marland Kitchen levocetirizine (XYZAL) 5 MG tablet Take 1 tablet by mouth daily.  9  . losartan (COZAAR) 25 MG tablet Take 1 tablet (25 mg total) by mouth daily. 90 tablet 1  . MAGNESIUM PO Take by mouth.    . metFORMIN (GLUCOPHAGE) 1000 MG tablet TAKE 1 TABLET(1000 MG) BY MOUTH TWICE DAILY WITH A MEAL 180 tablet 0  . montelukast  (SINGULAIR) 10 MG tablet Take 1 tablet (10 mg total) by mouth at bedtime. 90 tablet 3  . Multiple Vitamins-Minerals (CENTRUM ADULTS PO) Take by mouth.    . traZODone (DESYREL) 50 MG tablet Take 1 tablet (50 mg total) by mouth at bedtime. 90 tablet 1  . venlafaxine XR (EFFEXOR-XR) 37.5 MG 24 hr capsule Take 1 capsule (37.5 mg total) by mouth daily with breakfast. 30 capsule 2  . vitamin C (ASCORBIC ACID) 500 MG tablet Take 500 mg by mouth daily.     No current facility-administered medications on file prior to visit.    Allergies  Allergen Reactions  . Adhesive [Tape] Rash and Other (See Comments)    Peeling of skin  . Prednisone     Hyperglycemia with oral steroids   Family History  Problem Relation Age of Onset  . Cancer Mother        breast  . Breast cancer Mother 39       died when she was 48  . Diabetes Maternal Grandmother    Social History   Socioeconomic History  . Marital status: Married    Spouse name:  None  . Number of children: 2  . Years of education: None  . Highest education level: None  Social Needs  . Financial resource strain: None  . Food insecurity - worry: None  . Food insecurity - inability: None  . Transportation needs - medical: None  . Transportation needs - non-medical: None  Occupational History  . Occupation: Retail buyer.    Employer: [employer]  Tobacco Use  . Smoking status: Never Smoker  . Smokeless tobacco: Never Used  Substance and Sexual Activity  . Alcohol use: Yes    Alcohol/week: 0.5 - 1.0 oz    Types: 1 - 2 Standard drinks or equivalent per week    Comment: Occasional beer  . Drug use: No  . Sexual activity: No    Partners: Male    Birth control/protection: Surgical  Other Topics Concern  . None  Social History Narrative   2 Sons   Works at [employer]   Married   Depression screen [facility] 2/9 06/21/2017 05/10/2017 03/26/2017 02/15/2017 11/16/2016  Decreased Interest 0 0 0 0 0  Down, Depressed, Hopeless 0 0 0 0 0    PHQ - 2 Score 0 0 0 0 0    Review of Systems See hpi    Objective:   Physical Exam  Constitutional: She is oriented to person, place, and time. She appears well-developed and well-nourished. No distress.  HENT:  Head: Normocephalic and atraumatic.  Right Ear: External ear normal.  Left Ear: External ear normal.  Eyes: Conjunctivae are normal. No scleral icterus.  Neck: Normal range of motion. Neck supple. No thyromegaly present.  Cardiovascular: Normal rate, regular rhythm, normal heart sounds and intact distal pulses.  Pulmonary/Chest: Effort normal and breath sounds normal. No respiratory distress.  Musculoskeletal: She exhibits no edema.  Lymphadenopathy:    She has no cervical adenopathy.  Neurological: She is alert and oriented to person, place, and time.  Skin: Skin is warm and dry. She is not diaphoretic. No erythema.  Psychiatric: She has a normal mood and affect. Her behavior is normal.       BP 140/78   Pulse 88   Temp 97.8 F (36.6 C)   Resp 16   Ht 5\' 10"  (1.778 m)   Wt 216 lb 3.2 oz (98.1 kg)   SpO2 98%   BMI 31.02 kg/m   Assessment & Plan:   1. Type 2 diabetes mellitus without complication, without long-term current use of insulin ([place])   2. Anxiety and depression   3. Class 1 obesity due to excess calories with serious comorbidity and body mass index (BMI) of 31.0 to 31.9 in adult      Meds ordered this encounter  Medications  . venlafaxine XR (EFFEXOR-XR) 75 MG 24 hr capsule    Sig: Take 1 capsule (75 mg total) by mouth daily with breakfast.    Dispense:  90 capsule    Refill:  0  . traZODone (DESYREL) 50 MG tablet    Sig: Take 1 tablet (50 mg total) by mouth at bedtime.    Dispense:  90 tablet    Refill:  1  . metFORMIN (GLUCOPHAGE) 1000 MG tablet    Sig: TAKE 1 TABLET(1000 MG) BY MOUTH TWICE DAILY WITH A MEAL    Dispense:  180 tablet    Refill:  3    [name], M.D.  Primary Care at [facility] [address] [place],  [place] [address] [phone] phone [phone] fax  06/24/17 10:33 AM

## 2017-06-21 NOTE — Patient Instructions (Signed)
     IF you received an x-ray today, you will receive an invoice from [facility]. Please contact  Radiology at [phone] with questions or concerns regarding your invoice.   IF you received labwork today, you will receive an invoice from [facility]. Please contact [facility] at [phone] with questions or concerns regarding your invoice.   Our billing staff will not be able to assist you with questions regarding bills from these companies.  You will be contacted with the lab results as soon as they are available. The fastest way to get your results is to activate your My Chart account. Instructions are located on the last page of this paperwork. If you have not heard from us regarding the results in 2 weeks, please contact this office.     

## 2017-06-22 NOTE — Telephone Encounter (Signed)
Last OV 06/21/17. Medication not seen on current med list.

## 2017-07-24 ENCOUNTER — Other Ambulatory Visit: Payer: Self-pay

## 2017-07-24 ENCOUNTER — Encounter: Payer: Self-pay | Admitting: Physician Assistant

## 2017-07-24 ENCOUNTER — Ambulatory Visit: Payer: PRIVATE HEALTH INSURANCE | Admitting: Physician Assistant

## 2017-07-24 VITALS — BP 130/80 | HR 112 | Temp 97.6°F | Resp 16 | Ht 70.0 in | Wt 209.8 lb

## 2017-07-24 DIAGNOSIS — R1084 Generalized abdominal pain: Secondary | ICD-10-CM

## 2017-07-24 DIAGNOSIS — R112 Nausea with vomiting, unspecified: Secondary | ICD-10-CM | POA: Diagnosis not present

## 2017-07-24 DIAGNOSIS — R000 Tachycardia, unspecified: Secondary | ICD-10-CM

## 2017-07-24 LAB — POCT CBC
Granulocyte percent: 89.4 % — AB (ref 37–80)
HCT, POC: 42.4 % (ref 37.7–47.9)
Hemoglobin: 14.4 g/dL (ref 12.2–16.2)
Lymph, poc: 0.8 (ref 0.6–3.4)
MCH, POC: 30.2 pg (ref 27–31.2)
MCHC: 33.9 g/dL (ref 31.8–35.4)
MCV: 88.9 fL (ref 80–97)
MID (cbc): 0.4 (ref 0–0.9)
MPV: 8.3 fL (ref 0–99.8)
POC Granulocyte: 9.7 — AB (ref 2–6.9)
POC LYMPH PERCENT: 7.2 %L — AB (ref 10–50)
POC MID %: 3.4 % (ref 0–12)
Platelet Count, POC: 245 10*3/uL (ref 142–424)
RBC: 4.77 M/uL (ref 4.04–5.48)
RDW, POC: 13.4 %
WBC: 10.8 10*3/uL — AB (ref 4.6–10.2)

## 2017-07-24 LAB — POCT URINALYSIS DIP (MANUAL ENTRY)
Bilirubin, UA: NEGATIVE
Blood, UA: NEGATIVE
Glucose, UA: NEGATIVE mg/dL
Leukocytes, UA: NEGATIVE
Nitrite, UA: NEGATIVE
Protein Ur, POC: NEGATIVE mg/dL
Spec Grav, UA: >=1.03 — AB (ref 1.010–1.025)
Urobilinogen, UA: 0.2 E.U./dL
pH, UA: 5.5 (ref 5.0–8.0)

## 2017-07-24 LAB — POC INFLUENZA A&B (BINAX/QUICKVUE)
Influenza A, POC: NEGATIVE
Influenza B, POC: NEGATIVE

## 2017-07-24 MED ORDER — ONDANSETRON HCL 4 MG/2ML IJ SOLN
2.0000 mg | Freq: Once | INTRAMUSCULAR | Status: AC
  Administered 2017-07-24: 2 mg via INTRAMUSCULAR

## 2017-07-24 MED ORDER — ONDANSETRON 8 MG PO TBDP: 8.0000 mg | ORAL_TABLET | Freq: Three times a day (TID) | ORAL | 0 refills | Status: DC | PRN

## 2017-07-24 NOTE — Progress Notes (Signed)
Kelly Reed  MRN: [id number] DOB: January 21, 1966  PCP: Shawnee Knapp, MD  Subjective:  Pt is a 52 year old female PMH HTN, asthma, DM, anxiety and depression who presents to clinic for abdominal pain. She endorses diarrhea and vomiting yesterday. Generalized abdominal pain started this morning. Pain is 5/10 on 10 point pain scale.  She is eating okay - peanut butter toast this morning. She had several meals yesterday.  Pt is dizzy when she sits up from laying down.  Denies blood in her stool or emesis.  Her son and husband are also sick with similar symptoms.   Review of Systems  Constitutional: Positive for fatigue. Negative for chills, diaphoresis and fever.  Gastrointestinal: Positive for abdominal pain (LLQ pain), diarrhea, nausea and vomiting. Negative for blood in stool.    Patient Active Problem List   Diagnosis Date Noted  . Asthma 09/20/2016  . Chronic seasonal allergic rhinitis 09/20/2016  . Anemia, iron deficiency 08/07/2014  . Allergic reaction to chemical substance 08/07/2014  . Muscle cramp, nocturnal 08/07/2014  . Essential hypertension, benign 12/07/2012  . Polypharmacy 12/07/2012  . Ganglion cyst 10/22/2012  . Hypercholesteremia 05/04/2011  . Anxiety and depression 02/01/2011  . EDEMA 03/04/2009  . Allergic state 11/16/2006  . Diabetes mellitus, type II ([place]) 10/25/2006    Current Outpatient Medications on File Prior to Visit  Medication Sig Dispense Refill  . albuterol (VENTOLIN HFA) 108 (90 Base) MCG/ACT inhaler INHALE 2 PUFFS BY MOUTH INTO THE LUNGS EVERY 6 HOURS AS NEEDED WHEEZING OR SHORTNESS OF BREATH 18 g 5  . aspirin EC 81 MG tablet Take 1 tablet (81 mg total) by mouth daily.    Marland Kitchen atorvastatin (LIPITOR) 10 MG tablet TAKE 1 TABLET(10 MG) BY MOUTH DAILY 90 tablet 1  . BREO ELLIPTA 100-25 MCG/INH AEPB INL 1 PUFF PO QD  3  . cetirizine (ZYRTEC) 10 MG tablet Take 10 mg by mouth daily.    Marland Kitchen Epinastine HCl 0.05 % ophthalmic solution Place 1 drop into both  eyes 2 (two) times daily. 5 mL 2  . ferrous sulfate 325 (65 FE) MG tablet Take 325 mg by mouth at bedtime.      Marland Kitchen levocetirizine (XYZAL) 5 MG tablet Take 1 tablet by mouth daily.  9  . losartan (COZAAR) 25 MG tablet Take 1 tablet (25 mg total) by mouth daily. 90 tablet 1  . MAGNESIUM PO Take by mouth.    . metFORMIN (GLUCOPHAGE) 1000 MG tablet TAKE 1 TABLET(1000 MG) BY MOUTH TWICE DAILY WITH A MEAL 180 tablet 3  . montelukast (SINGULAIR) 10 MG tablet Take 1 tablet (10 mg total) by mouth at bedtime. 90 tablet 3  . Multiple Vitamins-Minerals (CENTRUM ADULTS PO) Take by mouth.    . traMADol (ULTRAM) 50 MG tablet TAKE 1 TABLET BY MOUTH EVERY 8 HOURS AS NEEDED 30 tablet 0  . traZODone (DESYREL) 50 MG tablet Take 1 tablet (50 mg total) by mouth at bedtime. 90 tablet 1  . venlafaxine XR (EFFEXOR-XR) 75 MG 24 hr capsule Take 1 capsule (75 mg total) by mouth daily with breakfast. 90 capsule 0  . vitamin C (ASCORBIC ACID) 500 MG tablet Take 500 mg by mouth daily.    . Azelastine-Fluticasone (DYMISTA) 137-50 MCG/ACT SUSP Place into the nose.     No current facility-administered medications on file prior to visit.     Allergies  Allergen Reactions  . Adhesive [Tape] Rash and Other (See Comments)    Peeling of skin  .  Prednisone     Hyperglycemia with oral steroids     Objective:  BP 130/80   Pulse (!) 112   Temp 97.6 F (36.4 C)   Resp 16   Ht 5' 10"  (1.778 m)   Wt 209 lb 12.8 oz (95.2 kg)   SpO2 96%   BMI 30.10 kg/m   Physical Exam  Constitutional: She is oriented to person, place, and time and well-developed, well-nourished, and in no distress. No distress.  Cardiovascular: Normal rate, regular rhythm and normal heart sounds.  Abdominal: Soft. Normal appearance and bowel sounds are normal. There is generalized tenderness. There is no guarding, no tenderness at McBurney's point and negative Murphy's sign.  Neurological: She is alert and oriented to person, place, and time. GCS score is  15.  Skin: Skin is warm and dry.  Psychiatric: Mood, memory, affect and judgment normal.  Vitals reviewed.  Results for orders placed or performed in visit on 07/24/17  POCT CBC  Result Value Ref Range   WBC 10.8 (A) 4.6 - 10.2 K/uL   Lymph, poc 0.8 0.6 - 3.4   POC LYMPH PERCENT 7.2 (A) 10 - 50 %L   MID (cbc) 0.4 0 - 0.9   POC MID % 3.4 0 - 12 %M   POC Granulocyte 9.7 (A) 2 - 6.9   Granulocyte percent 89.4 (A) 37 - 80 %G   RBC 4.77 4.04 - 5.48 M/uL   Hemoglobin 14.4 12.2 - 16.2 g/dL   HCT, POC 42.4 37.7 - 47.9 %   MCV 88.9 80 - 97 fL   MCH, POC 30.2 27 - 31.2 pg   MCHC 33.9 31.8 - 35.4 g/dL   RDW, POC 13.4 %   Platelet Count, POC 245 142 - 424 K/uL   MPV 8.3 0 - 99.8 fL  POC Influenza A&B(BINAX/QUICKVUE)  Result Value Ref Range   Influenza A, POC Negative Negative   Influenza B, POC Negative Negative  POCT urinalysis dipstick  Result Value Ref Range   Color, UA yellow yellow   Clarity, UA clear clear   Glucose, UA negative negative mg/dL   Bilirubin, UA negative negative   Ketones, POC UA trace (5) (A) negative mg/dL   Spec Grav, UA >=1.030 (A) 1.010 - 1.025   Blood, UA negative negative   pH, UA 5.5 5.0 - 8.0   Protein Ur, POC negative negative mg/dL   Urobilinogen, UA 0.2 0.2 or 1.0 E.U./dL   Nitrite, UA Negative Negative   Leukocytes, UA Negative Negative    Assessment and Plan :  1. Nausea and vomiting, intractability of vomiting not specified, unspecified vomiting type 2. Generalized abdominal pain 3. Tachycardia - POC Influenza A&B(BINAX/QUICKVUE) - ondansetron (ZOFRAN) injection 2 mg - ondansetron (ZOFRAN-ODT) 8 MG disintegrating tablet; Take 1 tablet (8 mg total) by mouth every 8 (eight) hours as needed for nausea or vomiting.  Dispense: 20 tablet; Refill: 0 - POCT urinalysis dipstick - POCT CBC - CMP14+EGFR - Pt c/o n/v. She does not appear acutely ill. She is tachycardic. WBC count is 10.8. Suspect viral gastritis.  Plan to treat nausea and advise bland  diet. RTC if symptoms worsen or do not improve.   Mercer Pod, PA-C  Primary Care at [facility] 07/24/2017 12:02 PM

## 2017-07-24 NOTE — Patient Instructions (Addendum)
Make it a point to stay well hydrated! Drink water, Gatorade, soup broth, juice with low sugar content, tea.  See below for bland diet - do this until your symptoms resolve.  Come back and see me on Friday (or sooner) if you are still not better or if your symptoms worsen.   Thank you for coming in today. I hope you feel we met your needs. Feel free to call PCP if you have any questions or further requests. Please consider signing up for MyChart if you do not already have it, as this is a great way to communicate with me.  Best,  Whitney McVey, PA-C  Bland Diet A bland diet consists of foods that do not have a lot of fat or fiber. Foods without fat or fiber are easier for the body to digest. They are also less likely to irritate your mouth, throat, stomach, and other parts of your gastrointestinal tract. A bland diet is sometimes called a BRAT diet. What is my plan? Your health care provider or dietitian may recommend specific changes to your diet to prevent and treat your symptoms, such as:  Eating small meals often.  Cooking food until it is soft enough to chew easily.  Chewing your food well.  Drinking fluids slowly.  Not eating foods that are very spicy, sour, or fatty.  Not eating citrus fruits, such as oranges and grapefruit.  What do I need to know about this diet?  Eat a variety of foods from the bland diet food list.  Do not follow a bland diet longer than you have to.  Ask your health care provider whether you should take vitamins. What foods can I eat? Grains  Hot cereals, such as cream of wheat. Bread, crackers, or tortillas made from refined white flour. Rice. Vegetables Canned or cooked vegetables. Mashed or boiled potatoes. Fruits Bananas. Applesauce. Other types of cooked or canned fruit with the skin and seeds removed, such as canned peaches or pears. Meats and Other Protein Sources Scrambled eggs. Creamy peanut butter or other nut butters. Lean, well-cooked  meats, such as chicken or fish. Tofu. Soups or broths. Dairy Low-fat dairy products, such as milk, cottage cheese, or yogurt. Beverages Water. Herbal tea. Apple juice. Sweets and Desserts Pudding. Custard. Fruit gelatin. Ice cream. Fats and Oils Mild salad dressings. [place] or olive oil. The items listed above may not be a complete list of allowed foods or beverages. Contact your dietitian for more options. What foods are not recommended? Foods and ingredients that are often not recommended include:  Spicy foods, such as hot sauce or salsa.  Fried foods.  Sour foods, such as pickled or fermented foods.  Raw vegetables or fruits, especially citrus or berries.  Caffeinated drinks.  Alcohol.  Strongly flavored seasonings or condiments.  The items listed above may not be a complete list of foods and beverages that are not allowed. Contact your dietitian for more information. This information is not intended to replace advice given to you by your health care provider. Make sure you discuss any questions you have with your health care provider. Document Released: 10/18/2015 Document Revised: 12/02/2015 Document Reviewed: 07/08/2014 Elsevier Interactive Patient Education  2018 [employer].   IF you received an x-ray today, you will receive an invoice from [facility]. Please contact [facility] at [phone] with questions or concerns regarding your invoice.   IF you received labwork today, you will receive an invoice from [facility]. Please contact [facility] at [phone] with questions  or concerns regarding your invoice.   Our billing staff will not be able to assist you with questions regarding bills from these companies.  You will be contacted with the lab results as soon as they are available. The fastest way to get your results is to activate your My Chart account. Instructions are located on the last page of this paperwork. If you have not heard from [place]  regarding the results in 2 weeks, please contact this office.

## 2017-07-25 LAB — CMP14+EGFR
ALT: 32 IU/L (ref 0–32)
AST: 23 IU/L (ref 0–40)
Albumin/Globulin Ratio: 1.9 (ref 1.2–2.2)
Albumin: 4.5 g/dL (ref 3.5–5.5)
Alkaline Phosphatase: 66 IU/L (ref 39–117)
BUN/Creatinine Ratio: 24 — ABNORMAL HIGH (ref 9–23)
BUN: 15 mg/dL (ref 6–24)
Bilirubin Total: 0.2 mg/dL (ref 0.0–1.2)
CO2: 23 mmol/L (ref 20–29)
Calcium: 9.3 mg/dL (ref 8.7–10.2)
Chloride: 102 mmol/L (ref 96–106)
Creatinine, Ser: 0.63 mg/dL (ref 0.57–1.00)
GFR calc Af Amer: 118 mL/min/{1.73_m2} (ref >59)
GFR calc non Af Amer: 103 mL/min/{1.73_m2} (ref >59)
Globulin, Total: 2.4 g/dL (ref 1.5–4.5)
Glucose: 137 mg/dL — ABNORMAL HIGH (ref 65–99)
Potassium: 4.3 mmol/L (ref 3.5–5.2)
Sodium: 143 mmol/L (ref 134–144)
Total Protein: 6.9 g/dL (ref 6.0–8.5)

## 2017-08-21 ENCOUNTER — Encounter: Payer: Self-pay | Admitting: Emergency Medicine

## 2017-08-21 ENCOUNTER — Ambulatory Visit: Payer: PRIVATE HEALTH INSURANCE | Admitting: Emergency Medicine

## 2017-08-21 ENCOUNTER — Other Ambulatory Visit: Payer: Self-pay

## 2017-08-21 VITALS — BP 120/68 | HR 84 | Temp 98.3°F | Resp 16 | Ht 69.0 in | Wt 210.8 lb

## 2017-08-21 DIAGNOSIS — R0981 Nasal congestion: Secondary | ICD-10-CM | POA: Insufficient documentation

## 2017-08-21 DIAGNOSIS — R05 Cough: Secondary | ICD-10-CM | POA: Diagnosis not present

## 2017-08-21 DIAGNOSIS — R059 Cough, unspecified: Secondary | ICD-10-CM | POA: Insufficient documentation

## 2017-08-21 DIAGNOSIS — J22 Unspecified acute lower respiratory infection: Secondary | ICD-10-CM | POA: Insufficient documentation

## 2017-08-21 DIAGNOSIS — Z8709 Personal history of other diseases of the respiratory system: Secondary | ICD-10-CM | POA: Insufficient documentation

## 2017-08-21 MED ORDER — TRIAMCINOLONE ACETONIDE 55 MCG/ACT NA AERO: 2.0000 | INHALATION_SPRAY | Freq: Every day | NASAL | 12 refills | Status: DC

## 2017-08-21 MED ORDER — PROMETHAZINE-CODEINE 6.25-10 MG/5ML PO SYRP: 5.0000 mL | ORAL_SOLUTION | Freq: Every evening | ORAL | 0 refills | Status: DC | PRN

## 2017-08-21 MED ORDER — PREDNISONE 20 MG PO TABS: 40.0000 mg | ORAL_TABLET | Freq: Every day | ORAL | 0 refills | Status: AC

## 2017-08-21 MED ORDER — AZITHROMYCIN 250 MG PO TABS: ORAL_TABLET | ORAL | 0 refills | Status: DC

## 2017-08-21 NOTE — Progress Notes (Signed)
Kelly Reed 52 y.o.   Chief Complaint  Patient presents with  . Cough    x 3 day - yellow mucus  . Nasal Congestion    with sinus pressure    HISTORY OF PRESENT ILLNESS: This is a 52 y.o. female sick for 4 days.  Mostly complaining of productive cough with sinus pressure and congestion.  No other significant symptoms.  URI   This is a new problem. The current episode started in the past 7 days. The problem has been gradually worsening. There has been no fever. Associated symptoms include congestion, coughing, headaches, sinus pain, a sore throat and wheezing. Pertinent negatives include no abdominal pain, chest pain, diarrhea, dysuria, ear pain, joint pain, nausea, neck pain, rash or vomiting.     Prior to Admission medications   Medication Sig Start Date End Date Taking? Authorizing Provider  albuterol (VENTOLIN HFA) 108 (90 Base) MCG/ACT inhaler INHALE 2 PUFFS BY MOUTH INTO THE LUNGS EVERY 6 HOURS AS NEEDED WHEEZING OR SHORTNESS OF BREATH 03/23/16  Yes Shawnee Knapp, MD  aspirin EC 81 MG tablet Take 1 tablet (81 mg total) by mouth daily. 08/07/14  Yes Shawnee Knapp, MD  atorvastatin (LIPITOR) 10 MG tablet TAKE 1 TABLET(10 MG) BY MOUTH DAILY 11/21/16  Yes Shawnee Knapp, MD  Azelastine-Fluticasone [employer]) 137-50 MCG/ACT SUSP Place into the nose.   Yes [provider]  BREO ELLIPTA 100-25 MCG/INH AEPB INL 1 PUFF PO QD 10/04/16  Yes [provider]  cetirizine (ZYRTEC) 10 MG tablet Take 10 mg by mouth daily.   Yes [provider]  Epinastine HCl 0.05 % ophthalmic solution Place 1 drop into both eyes 2 (two) times daily. 09/21/16  Yes Shawnee Knapp, MD  ferrous sulfate 325 (65 FE) MG tablet Take 325 mg by mouth at bedtime.     Yes [provider]  levocetirizine (XYZAL) 5 MG tablet Take 1 tablet by mouth daily. 03/08/17  Yes [provider]  losartan (COZAAR) 25 MG tablet Take 1 tablet (25 mg total) by mouth daily. 05/10/17  Yes Shawnee Knapp, MD    MAGNESIUM PO Take by mouth.   Yes [provider]  metFORMIN (GLUCOPHAGE) 1000 MG tablet TAKE 1 TABLET(1000 MG) BY MOUTH TWICE DAILY WITH A MEAL 06/21/17  Yes Shawnee Knapp, MD  montelukast (SINGULAIR) 10 MG tablet Take 1 tablet (10 mg total) by mouth at bedtime. 03/26/17  Yes English, Colletta Maryland D, PA  Multiple Vitamins-Minerals (CENTRUM ADULTS PO) Take by mouth.   Yes [provider]  ondansetron (ZOFRAN-ODT) 8 MG disintegrating tablet Take 1 tablet (8 mg total) by mouth every 8 (eight) hours as needed for nausea or vomiting. 07/24/17  Yes McVey, Gelene Mink, PA-C  traMADol (ULTRAM) 50 MG tablet TAKE 1 TABLET BY MOUTH EVERY 8 HOURS AS NEEDED 06/25/17  Yes Shawnee Knapp, MD  traZODone (DESYREL) 50 MG tablet Take 1 tablet (50 mg total) by mouth at bedtime. 06/21/17  Yes Shawnee Knapp, MD  venlafaxine XR (EFFEXOR-XR) 75 MG 24 hr capsule Take 1 capsule (75 mg total) by mouth daily with breakfast. 06/21/17  Yes Shawnee Knapp, MD  vitamin C (ASCORBIC ACID) 500 MG tablet Take 500 mg by mouth daily.   Yes [provider]    Allergies  Allergen Reactions  . Adhesive [Tape] Rash and Other (See Comments)    Peeling of skin  . Prednisone     Hyperglycemia with oral steroids  Patient Active Problem List   Diagnosis Date Noted  . Asthma 09/20/2016  . Chronic seasonal allergic rhinitis 09/20/2016  . Anemia, iron deficiency 08/07/2014  . Allergic reaction to chemical substance 08/07/2014  . Muscle cramp, nocturnal 08/07/2014  . Essential hypertension, benign 12/07/2012  . Polypharmacy 12/07/2012  . Ganglion cyst 10/22/2012  . Hypercholesteremia 05/04/2011  . Anxiety and depression 02/01/2011  . EDEMA 03/04/2009  . Allergic state 11/16/2006  . Diabetes mellitus, type II ([place]) 10/25/2006    Past Medical History:  Diagnosis Date  . Anemia   . Anxiety   . Asthma   . Diabetes mellitus type II   . Hyperlipidemia   . Hypertension   . NSVD (normal spontaneous vaginal  delivery)    x 2    Past Surgical History:  Procedure Laterality Date  . 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 History of Abd. ultrasound  12/04   negative  . Surg. eval lap cholecstectomy  05/28/2006   deferred by patient  . TUBAL LIGATION  1993   bilateral    Social History   Socioeconomic History  . Marital status: Married    Spouse name: Not on file  . Number of children: 2  . Years of education: Not on file  . Highest education level: Not on file  Social Needs  . Financial resource strain: Not on file  . Food insecurity - worry: Not on file  . Food insecurity - inability: Not on file  . Transportation needs - medical: Not on file  . Transportation needs - non-medical: Not on file  Occupational History  . Occupation: Retail buyer.    Employer: [employer]  Tobacco Use  . Smoking status: Never Smoker  . Smokeless tobacco: Never Used  Substance and Sexual Activity  . Alcohol use: Yes    Alcohol/week: 0.5 - 1.0 oz    Types: 1 - 2 Standard drinks or equivalent per week    Comment: Occasional beer  . Drug use: No  . Sexual activity: No    Partners: Male    Birth control/protection: Surgical  Other Topics Concern  . Not on file  Social History Narrative   2 Sons   Works at [employer]   Married    Family History  Problem Relation Age of Onset  . Cancer Mother        breast  . Breast cancer Mother 14       died when she was 63  . Diabetes Maternal Grandmother      Review of Systems  Constitutional: Positive for chills. Negative for fever.  HENT: Positive for congestion, sinus pain and sore throat. Negative for ear pain.   Eyes: Negative.  Negative for discharge and redness.  Respiratory: Positive for cough and wheezing.   Cardiovascular: Negative for chest pain, palpitations and leg swelling.  Gastrointestinal: Negative.  Negative for abdominal pain, diarrhea, nausea and vomiting.  Genitourinary: Negative.  Negative for  dysuria and hematuria.  Musculoskeletal: Negative for joint pain and neck pain.  Skin: Negative for rash.  Neurological: Positive for headaches. Negative for dizziness.  Endo/Heme/Allergies: Negative.   All other systems reviewed and are negative.  Vitals:   08/21/17 1132  BP: 120/68  Pulse: 84  Resp: 16  Temp: 98.3 F (36.8 C)  SpO2: 97%     Physical Exam  Constitutional: She is oriented to person, place, and time. She appears well-developed and well-nourished.  HENT:  Head: Normocephalic and atraumatic.  Nose: Mucosal edema and sinus tenderness present.  Mouth/Throat: Oropharynx is clear and moist.  Eyes: Conjunctivae and EOM are normal. Pupils are equal, round, and reactive to light.  Neck: Normal range of motion. Neck supple. No JVD present. No thyromegaly present.  Cardiovascular: Normal rate, regular rhythm and normal heart sounds.  Pulmonary/Chest: Effort normal and breath sounds normal.  Abdominal: Soft. Bowel sounds are normal. There is no tenderness.  Musculoskeletal: Normal range of motion.  Lymphadenopathy:    She has no cervical adenopathy.  Neurological: She is alert and oriented to person, place, and time. No sensory deficit. She exhibits normal muscle tone.  Skin: Skin is warm and dry. Capillary refill takes less than 2 seconds. No rash noted.  Psychiatric: She has a normal mood and affect. Her behavior is normal.  Vitals reviewed.    ASSESSMENT & PLAN: Ticia was seen today for cough and nasal congestion.  Diagnoses and all orders for this visit:  Cough -     Discontinue: promethazine-codeine (PHENERGAN WITH CODEINE) 6.25-10 MG/5ML syrup; Take 5 mLs by mouth at bedtime as needed for cough. -     promethazine-codeine (PHENERGAN WITH CODEINE) 6.25-10 MG/5ML syrup; Take 5 mLs by mouth at bedtime as needed for cough.  Lower respiratory infection -     azithromycin (ZITHROMAX) 250 MG tablet; Sig as indicated  Sinus congestion -     predniSONE (DELTASONE)  20 MG tablet; Take 2 tablets (40 mg total) by mouth daily with breakfast for 5 days. -     triamcinolone (NASACORT) 55 MCG/ACT AERO nasal inhaler; Place 2 sprays into the nose daily.  History of asthma -     predniSONE (DELTASONE) 20 MG tablet; Take 2 tablets (40 mg total) by mouth daily with breakfast for 5 days.     Patient Instructions       IF you received an x-ray today, you will receive an invoice from [facility]. Please contact [facility] at [phone] with questions or concerns regarding your invoice.   IF you received labwork today, you will receive an invoice from [facility]. Please contact [facility] at [phone] with questions or concerns regarding your invoice.   Our billing staff will not be able to assist you with questions regarding bills from these companies.  You will be contacted with the lab results as soon as they are available. The fastest way to get your results is to activate your My Chart account. Instructions are located on the last page of this paperwork. If you have not heard from [place] regarding the results in 2 weeks, please contact this office.     Cough, Adult A cough helps to clear your throat and lungs. A cough may last only 2-3 weeks (acute), or it may last longer than 8 weeks (chronic). Many different things can cause a cough. A cough may be a sign of an illness or another medical condition. Follow these instructions at home:  Pay attention to any changes in your cough.  Take medicines only as told by your doctor. ? If you were prescribed an antibiotic medicine, take it as told by your doctor. Do not stop taking it even if you start to feel better. ? Talk with your doctor before you try using a cough medicine.  Drink enough fluid to keep your pee (urine) clear or pale yellow.  If the air is dry, use a cold steam vaporizer or humidifier in your home.  Stay away from things that make you cough at work or  at home.  If your  cough is worse at night, try using extra pillows to raise your head up higher while you sleep.  Do not smoke, and try not to be around smoke. If you need help quitting, ask your doctor.  Do not have caffeine.  Do not drink alcohol.  Rest as needed. Contact a doctor if:  You have new problems (symptoms).  You cough up yellow fluid (pus).  Your cough does not get better after 2-3 weeks, or your cough gets worse.  Medicine does not help your cough and you are not sleeping well.  You have pain that gets worse or pain that is not helped with medicine.  You have a fever.  You are losing weight and you do not know why.  You have night sweats. Get help right away if:  You cough up blood.  You have trouble breathing.  Your heartbeat is very fast. This information is not intended to replace advice given to you by your health care provider. Make sure you discuss any questions you have with your health care provider. Document Released: 03/09/2011 Document Revised: 12/02/2015 Document Reviewed: 09/02/2014 Elsevier Interactive Patient Education  2018 Elsevier Inc.     Agustina Caroli, MD [facility]

## 2017-08-21 NOTE — Patient Instructions (Addendum)
     IF you received an x-ray today, you will receive an invoice from [facility]. Please contact [facility] at [phone] with questions or concerns regarding your invoice.   IF you received labwork today, you will receive an invoice from [facility]. Please contact [facility] at [phone] with questions or concerns regarding your invoice.   Our billing staff will not be able to assist you with questions regarding bills from these companies.  You will be contacted with the lab results as soon as they are available. The fastest way to get your results is to activate your My Chart account. Instructions are located on the last page of this paperwork. If you have not heard from us regarding the results in 2 weeks, please contact this office.     Cough, Adult A cough helps to clear your throat and lungs. A cough may last only 2-3 weeks (acute), or it may last longer than 8 weeks (chronic). Many different things can cause a cough. A cough may be a sign of an illness or another medical condition. Follow these instructions at home:  Pay attention to any changes in your cough.  Take medicines only as told by your doctor. ? If you were prescribed an antibiotic medicine, take it as told by your doctor. Do not stop taking it even if you start to feel better. ? Talk with your doctor before you try using a cough medicine.  Drink enough fluid to keep your pee (urine) clear or pale yellow.  If the air is dry, use a cold steam vaporizer or humidifier in your home.  Stay away from things that make you cough at work or at home.  If your cough is worse at night, try using extra pillows to raise your head up higher while you sleep.  Do not smoke, and try not to be around smoke. If you need help quitting, ask your doctor.  Do not have caffeine.  Do not drink alcohol.  Rest as needed. Contact a doctor if:  You have new problems (symptoms).  You cough up yellow fluid  (pus).  Your cough does not get better after 2-3 weeks, or your cough gets worse.  Medicine does not help your cough and you are not sleeping well.  You have pain that gets worse or pain that is not helped with medicine.  You have a fever.  You are losing weight and you do not know why.  You have night sweats. Get help right away if:  You cough up blood.  You have trouble breathing.  Your heartbeat is very fast. This information is not intended to replace advice given to you by your health care provider. Make sure you discuss any questions you have with your health care provider. Document Released: 03/09/2011 Document Revised: 12/02/2015 Document Reviewed: 09/02/2014 Elsevier Interactive Patient Education  2018 Elsevier Inc.  

## 2017-08-23 ENCOUNTER — Ambulatory Visit: Payer: PRIVATE HEALTH INSURANCE | Admitting: Family Medicine

## 2017-08-30 ENCOUNTER — Ambulatory Visit: Payer: PRIVATE HEALTH INSURANCE | Admitting: Family Medicine

## 2017-09-12 ENCOUNTER — Ambulatory Visit: Payer: PRIVATE HEALTH INSURANCE | Admitting: Family Medicine

## 2017-09-12 ENCOUNTER — Other Ambulatory Visit: Payer: Self-pay

## 2017-09-12 ENCOUNTER — Encounter: Payer: Self-pay | Admitting: Family Medicine

## 2017-09-12 VITALS — BP 131/84 | HR 79 | Temp 97.7°F | Resp 18 | Ht 69.0 in | Wt 211.4 lb

## 2017-09-12 DIAGNOSIS — J302 Other seasonal allergic rhinitis: Secondary | ICD-10-CM

## 2017-09-12 DIAGNOSIS — E6609 Other obesity due to excess calories: Secondary | ICD-10-CM | POA: Diagnosis not present

## 2017-09-12 DIAGNOSIS — Z6831 Body mass index (BMI) 31.0-31.9, adult: Secondary | ICD-10-CM

## 2017-09-12 DIAGNOSIS — E119 Type 2 diabetes mellitus without complications: Secondary | ICD-10-CM | POA: Diagnosis not present

## 2017-09-12 DIAGNOSIS — I10 Essential (primary) hypertension: Secondary | ICD-10-CM

## 2017-09-12 DIAGNOSIS — F419 Anxiety disorder, unspecified: Secondary | ICD-10-CM

## 2017-09-12 DIAGNOSIS — D509 Iron deficiency anemia, unspecified: Secondary | ICD-10-CM

## 2017-09-12 DIAGNOSIS — F329 Major depressive disorder, single episode, unspecified: Secondary | ICD-10-CM

## 2017-09-12 LAB — POCT GLYCOSYLATED HEMOGLOBIN (HGB A1C): Hemoglobin A1C: 6.6

## 2017-09-12 MED ORDER — VENLAFAXINE HCL ER 150 MG PO CP24: 150.0000 mg | ORAL_CAPSULE | Freq: Every day | ORAL | 1 refills | Status: DC

## 2017-09-12 NOTE — Progress Notes (Addendum)
Subjective:  By signing my name below, I, [name], attest that this documentation has been prepared under the direction and in the presence of [name], MD. Electronically Signed: [name], [place]. 09/12/2017 , 6:07 PM .  Patient was seen in Room 3 .   Patient ID: Kelly Reed, female    DOB: 1966/01/12, 52 y.o.   MRN: [id number] Chief Complaint  Patient presents with  . Medication Management    pt states she doesn't remember what medications she needed to follow up on.  . Diabetes    pt states she checks her sugars her at home and they've been "fine". Pt states states she needs a new test strips and glucometer.  . Follow-up   HPI Kelly Reed is a 52 y.o. female who presents to Primary Care at [facility] for follow up on diabetes.   Diabetes Patient has been on metformin 1000mg  BID for many years. We discussed weight loss meds w/ potential of adding GLP1 to help at prior visits but pt started getting some results on her own efforts so held off.  Lab Results  Component Value Date   HGBA1C 6.6 09/12/2017   HGBA1C 7.1 05/10/2017   HGBA1C 6.3 (H) 11/21/2016   She also requests to have a new glucometer and test strips.   Wt Readings from Last 3 Encounters:  09/12/17 211 lb 6.4 oz (95.9 kg)  08/21/17 210 lb 12.8 oz (95.6 kg)  07/24/17 209 lb 12.8 oz (95.2 kg)    Anxiety/depression Patient was started on Effexor 4 months ago. She states she's doing well on it.   Seasonal Allergies Patient states her cough has resolved, but still having some sinus pain and pressure. She notes taking xyzal in the evenings and [employer] allergy medication in the morning, as her work is in an environment with dust. She is still on [place], and will see her allergy/pulmonologist Dr. Donneta Romberg on March 28th.   Supplements She takes iron supplement every other day.   Past Medical History:  Diagnosis Date  . Anemia   . Anxiety   . Asthma   . Diabetes mellitus type II   . Hyperlipidemia   .  Hypertension   . NSVD (normal spontaneous vaginal delivery)    x 2   Past Surgical History:  Procedure Laterality Date  . 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 History of Abd. ultrasound  12/04   negative  . Surg. eval lap cholecstectomy  05/28/2006   deferred by patient  . TUBAL LIGATION  1993   bilateral   Prior to Admission medications   Medication Sig Start Date End Date Taking? Authorizing Provider  albuterol (VENTOLIN HFA) 108 (90 Base) MCG/ACT inhaler INHALE 2 PUFFS BY MOUTH INTO THE LUNGS EVERY 6 HOURS AS NEEDED WHEEZING OR SHORTNESS OF BREATH 03/23/16   Shawnee Knapp, MD  aspirin EC 81 MG tablet Take 1 tablet (81 mg total) by mouth daily. 08/07/14   Shawnee Knapp, MD  atorvastatin (LIPITOR) 10 MG tablet TAKE 1 TABLET(10 MG) BY MOUTH DAILY 11/21/16   Shawnee Knapp, MD  Azelastine-Fluticasone [facility]) 137-50 MCG/ACT SUSP Place into the nose.    [provider]  azithromycin (ZITHROMAX) 250 MG tablet Sig as indicated 08/21/17   Horald Pollen, MD  BREO ELLIPTA 100-25 MCG/INH AEPB INL 1 PUFF PO QD 10/04/16   [provider]  cetirizine (ZYRTEC) 10 MG tablet Take 10 mg by mouth daily.    [provider]  Epinastine HCl 0.05 % ophthalmic solution Place 1 drop into both eyes 2 (two) times daily. 09/21/16   Shawnee Knapp, MD  ferrous sulfate 325 (65 FE) MG tablet Take 325 mg by mouth at bedtime.      [provider]  levocetirizine (XYZAL) 5 MG tablet Take 1 tablet by mouth daily. 03/08/17   [provider]  losartan (COZAAR) 25 MG tablet Take 1 tablet (25 mg total) by mouth daily. 05/10/17   Shawnee Knapp, MD  MAGNESIUM PO Take by mouth.    [provider]  metFORMIN (GLUCOPHAGE) 1000 MG tablet TAKE 1 TABLET(1000 MG) BY MOUTH TWICE DAILY WITH A MEAL 06/21/17   Shawnee Knapp, MD  montelukast (SINGULAIR) 10 MG tablet Take 1 tablet (10 mg total) by mouth at bedtime. 03/26/17   Ivar Drape D, PA  Multiple Vitamins-Minerals  (CENTRUM ADULTS PO) Take by mouth.    [provider]  ondansetron (ZOFRAN-ODT) 8 MG disintegrating tablet Take 1 tablet (8 mg total) by mouth every 8 (eight) hours as needed for nausea or vomiting. 07/24/17   McVey, Gelene Mink, PA-C  promethazine-codeine (PHENERGAN WITH CODEINE) 6.25-10 MG/5ML syrup Take 5 mLs by mouth at bedtime as needed for cough. 08/21/17   Horald Pollen, MD  traMADol (ULTRAM) 50 MG tablet TAKE 1 TABLET BY MOUTH EVERY 8 HOURS AS NEEDED 06/25/17   Shawnee Knapp, MD  traZODone (DESYREL) 50 MG tablet Take 1 tablet (50 mg total) by mouth at bedtime. 06/21/17   Shawnee Knapp, MD  triamcinolone (NASACORT) 55 MCG/ACT AERO nasal inhaler Place 2 sprays into the nose daily. 08/21/17   Horald Pollen, MD  venlafaxine XR (EFFEXOR-XR) 75 MG 24 hr capsule Take 1 capsule (75 mg total) by mouth daily with breakfast. 06/21/17   Shawnee Knapp, MD  vitamin C (ASCORBIC ACID) 500 MG tablet Take 500 mg by mouth daily.    [provider]   Allergies  Allergen Reactions  . Adhesive [Tape] Rash and Other (See Comments)    Peeling of skin  . Prednisone     Hyperglycemia with oral steroids   Family History  Problem Relation Age of Onset  . Cancer Mother        breast  . Breast cancer Mother 76       died when she was 47  . Diabetes Maternal Grandmother    Social History   Socioeconomic History  . Marital status: Married    Spouse name: None  . Number of children: 2  . Years of education: None  . Highest education level: None  Social Needs  . Financial resource strain: None  . Food insecurity - worry: None  . Food insecurity - inability: None  . Transportation needs - medical: None  . Transportation needs - non-medical: None  Occupational History  . Occupation: Retail buyer.    Employer: [employer]  Tobacco Use  . Smoking status: Never Smoker  . Smokeless tobacco: Never Used  Substance and Sexual Activity  . Alcohol use: Yes     Alcohol/week: 0.5 - 1.0 oz    Types: 1 - 2 Standard drinks or equivalent per week    Comment: Occasional beer  . Drug use: No  . Sexual activity: No    Partners: Male    Birth control/protection: Surgical  Other Topics Concern  . None  Social History Narrative   2 Sons   Works at [employer]   Married  Depression screen [facility] 2/9 09/12/2017 08/21/2017 07/24/2017 06/21/2017 05/10/2017  Decreased Interest 0 0 0 0 0  Down, Depressed, Hopeless 0 0 0 0 0  PHQ - 2 Score 0 0 0 0 0     Review of Systems  Constitutional: Negative for chills, fatigue, fever and unexpected weight change.  Respiratory: Negative for cough.   Gastrointestinal: Negative for constipation, diarrhea, nausea and vomiting.  Skin: Negative for rash and wound.  Allergic/Immunologic: Positive for environmental allergies.  Neurological: Negative for dizziness, weakness and headaches.       Objective:   Physical Exam  Constitutional: She is oriented to person, place, and time. She appears well-developed and well-nourished. No distress.  HENT:  Head: Normocephalic and atraumatic.  Right Ear: Tympanic membrane normal.  Left Ear: Tympanic membrane is injected and retracted.  Nose: Nose normal.  Mouth/Throat: Oropharynx is clear and moist.  Eyes: EOM are normal. Pupils are equal, round, and reactive to light.  Neck: Neck supple.  Cardiovascular: Normal rate.  Pulmonary/Chest: Effort normal and breath sounds normal. No respiratory distress. She has no wheezes.  Musculoskeletal: Normal range of motion.  Neurological: She is alert and oriented to person, place, and time.  Skin: Skin is warm and dry.  Psychiatric: She has a normal mood and affect. Her behavior is normal.  Nursing note and vitals reviewed.   BP 131/84 (BP Location: Right Arm, Patient Position: Sitting, Cuff Size: Large)   Pulse 79   Temp 97.7 F (36.5 C) (Oral)   Resp 18   Ht 5\' 9"  (1.753 m)   Wt 211 lb 6.4 oz (95.9 kg)   SpO2 98%   BMI  31.22 kg/m   Results for orders placed or performed in visit on 09/12/17  POCT glycosylated hemoglobin (Hb A1C)  Result Value Ref Range   Hemoglobin A1C 6.6        Assessment & Plan:   1. Type 2 diabetes mellitus without complication, without long-term current use of insulin (HCC) - controlled on metformin 1g bid  2. Essential hypertension, benign - well controlled on losartan 25  3. Class 1 obesity due to excess calories with serious comorbidity and body mass index (BMI) of 31.0 to 31.9 in adult -through diet and exercise, pt has lost 15 lbs in the past 6 mos and 7 lbs since her last visit 4 mos prior but all occurred within the first 2 mos after her most recent visit - no weight changes for the last 2 mos. increase exercise. Still wants to hold off on adding on med  4. Chronic seasonal allergic rhinitis -Followed by Allergist Dr. Donneta Romberg - next OV in 3 wks. taking 1/2 tab cetirizine (=5mg ) qam and xyzal 5mg  qhs and working adequately for her that way but breakthrough sxs w/o both of these. Cont singulair 10 - has both dymista nasal spray and nasacort on her med list but doesn't remember which she is doing - likely the nasacort but ask again if she remembers at next f/u ov. Asthma well controlled on Breo w/ good compliance  5. Anxiety and depression - doing somewhat better - increase effexor from 75 to 150. Cont trazodone 50 qhs.  6. Iron deficiency anemia, unspecified iron deficiency anemia type - on qod iron supplement - can likely stop as ferritin 4 mos prior was right in the middle of normal - has been gradually increasing from when to low 2 yrs prior.     Orders Placed This Encounter  Procedures  . Comprehensive metabolic  panel  . Microalbumin/Creatinine Ratio, Urine  . POCT glycosylated hemoglobin (Hb A1C)    Meds ordered this encounter  Medications  . venlafaxine XR (EFFEXOR-XR) 150 MG 24 hr capsule    Sig: Take 1 capsule (150 mg total) by mouth daily with breakfast.     Dispense:  90 capsule    Refill:  1    I personally performed the services described in this documentation, which was scribed in my presence. The recorded information has been reviewed and considered, and addended by me as needed.   [name], M.D.  Primary Care at [facility] [address] [place], [place] [address] [phone] phone [phone] fax  09/15/17 9:52 AM

## 2017-09-12 NOTE — Patient Instructions (Addendum)
   IF you received an x-ray today, you will receive an invoice from [facility]. Please contact [facility] at [phone] with questions or concerns regarding your invoice.   IF you received labwork today, you will receive an invoice from [facility]. Please contact [facility] at [phone] with questions or concerns regarding your invoice.   Our billing staff will not be able to assist you with questions regarding bills from these companies.  You will be contacted with the lab results as soon as they are available. The fastest way to get your results is to activate your My Chart account. Instructions are located on the last page of this paperwork. If you have not heard from us regarding the results in 2 weeks, please contact this office.     Exercising to Lose Weight Exercising can help you to lose weight. In order to lose weight through exercise, you need to do vigorous-intensity exercise. You can tell that you are exercising with vigorous intensity if you are breathing very hard and fast and cannot hold a conversation while exercising. Moderate-intensity exercise helps to maintain your current weight. You can tell that you are exercising at a moderate level if you have a higher heart rate and faster breathing, but you are still able to hold a conversation. How often should I exercise? Choose an activity that you enjoy and set realistic goals. Your health care provider can help you to make an activity plan that works for you. Exercise regularly as directed by your health care provider. This may include:  Doing resistance training twice each week, such as: ? Push-ups. ? Sit-ups. ? Lifting weights. ? Using resistance bands.  Doing a given intensity of exercise for a given amount of time. Choose from these options: ? 150 minutes of moderate-intensity exercise every week. ? 75 minutes of vigorous-intensity exercise every week. ? A mix of moderate-intensity and  vigorous-intensity exercise every week.  Children, pregnant women, people who are out of shape, people who are overweight, and older adults may need to consult a health care provider for individual recommendations. If you have any sort of medical condition, be sure to consult your health care provider before starting a new exercise program. What are some activities that can help me to lose weight?  Walking at a rate of at least 4.5 miles an hour.  Jogging or running at a rate of 5 miles per hour.  Biking at a rate of at least 10 miles per hour.  Lap swimming.  Roller-skating or in-line skating.  Cross-country skiing.  Vigorous competitive sports, such as football, basketball, and soccer.  Jumping rope.  Aerobic dancing. How can I be more active in my day-to-day activities?  Use the stairs instead of the elevator.  Take a walk during your lunch break.  If you drive, park your car farther away from work or school.  If you take public transportation, get off one stop early and walk the rest of the way.  Make all of your phone calls while standing up and walking around.  Get up, stretch, and walk around every 30 minutes throughout the day. What guidelines should I follow while exercising?  Do not exercise so much that you hurt yourself, feel dizzy, or get very short of breath.  Consult your health care provider prior to starting a new exercise program.  Wear comfortable clothes and shoes with good support.  Drink plenty of water while you exercise to prevent dehydration or heat stroke. Body water is   lost during exercise and must be replaced.  Work out until you breathe faster and your heart beats faster. This information is not intended to replace advice given to you by your health care provider. Make sure you discuss any questions you have with your health care provider. Document Released: 07/29/2010 Document Revised: 12/02/2015 Document Reviewed: 11/27/2013 Elsevier  Interactive Patient Education  2018 [employer]vier Inc.  

## 2017-09-13 LAB — COMPREHENSIVE METABOLIC PANEL
A/G RATIO: 1.9 (ref 1.2–2.2)
ALK PHOS: 75 IU/L (ref 39–117)
ALT: 26 IU/L (ref 0–32)
AST: 20 IU/L (ref 0–40)
Albumin: 4.8 g/dL (ref 3.5–5.5)
BUN/Creatinine Ratio: 21 (ref 9–23)
BUN: 13 mg/dL (ref 6–24)
CHLORIDE: 102 mmol/L (ref 96–106)
CO2: 24 mmol/L (ref 20–29)
Calcium: 9.5 mg/dL (ref 8.7–10.2)
Creatinine, Ser: 0.63 mg/dL (ref 0.57–1.00)
GFR calc non Af Amer: 103 mL/min/{1.73_m2} (ref >59)
GFR, EST AFRICAN AMERICAN: 118 mL/min/{1.73_m2} (ref >59)
GLUCOSE: 104 mg/dL — AB (ref 65–99)
Globulin, Total: 2.5 g/dL (ref 1.5–4.5)
POTASSIUM: 4 mmol/L (ref 3.5–5.2)
Sodium: 142 mmol/L (ref 134–144)
TOTAL PROTEIN: 7.3 g/dL (ref 6.0–8.5)

## 2017-09-13 LAB — MICROALBUMIN / CREATININE URINE RATIO
CREATININE, UR: 56.4 mg/dL
MICROALB/CREAT RATIO: 10.1 mg/g{creat} (ref 0.0–30.0)
Microalbumin, Urine: 5.7 ug/mL

## 2017-09-14 ENCOUNTER — Encounter: Payer: Self-pay | Admitting: Family Medicine

## 2017-09-14 ENCOUNTER — Ambulatory Visit: Payer: PRIVATE HEALTH INSURANCE | Admitting: Family Medicine

## 2017-09-14 ENCOUNTER — Other Ambulatory Visit: Payer: Self-pay

## 2017-09-14 VITALS — BP 140/80 | HR 93 | Temp 98.8°F | Ht 71.0 in | Wt 209.6 lb

## 2017-09-14 DIAGNOSIS — M722 Plantar fascial fibromatosis: Secondary | ICD-10-CM | POA: Diagnosis not present

## 2017-09-14 MED ORDER — DICLOFENAC SODIUM 75 MG PO TBEC: 75.0000 mg | DELAYED_RELEASE_TABLET | Freq: Two times a day (BID) | ORAL | 1 refills | Status: DC

## 2017-09-14 NOTE — Patient Instructions (Addendum)
Do not take any ibuprofen nor naproxen while taking diclofenac     IF you received an x-ray today, you will receive an invoice from [facility]. Please contact [facility] at [phone] with questions or concerns regarding your invoice.   IF you received labwork today, you will receive an invoice from [facility]. Please contact [facility] at [phone] with questions or concerns regarding your invoice.   Our billing staff will not be able to assist you with questions regarding bills from these companies.  You will be contacted with the lab results as soon as they are available. The fastest way to get your results is to activate your My Chart account. Instructions are located on the last page of this paperwork. If you have not heard from [place] regarding the results in 2 weeks, please contact this office.

## 2017-09-14 NOTE — Progress Notes (Signed)
3/8/201912:10 PM  Kelly Reed Mar 06, 1966, 52 y.o. female [id number]  Chief Complaint  Patient presents with  . Pain    having pain the right foot . See's foot doctor dx with plantar fascitis. Works standing on feet all day. Taking otc pain pills with no relief    HPI:   Patient is a 52 y.o. female with past medical history significant for right sided plantar fascitis who presents today for flareup  Started 2 days ago, has been taking tylenol and aleve without relief.  Has started stretching Uses arch support Was so painful yesterday at work that it made her cry, she works standing on cement floors She saw podiatrist over a year ago, steroid injection then Has not had flare-up in over a year No trauma or injuries Has known calcaneal spur  Depression screen [facility] 2/9 09/14/2017 09/12/2017 08/21/2017  Decreased Interest 0 0 0  Down, Depressed, Hopeless 0 0 0  PHQ - 2 Score 0 0 0    Allergies  Allergen Reactions  . Adhesive [Tape] Rash and Other (See Comments)    Peeling of skin  . Prednisone     Hyperglycemia with oral steroids    Prior to Admission medications   Medication Sig Start Date End Date Taking? Authorizing Provider  albuterol (VENTOLIN HFA) 108 (90 Base) MCG/ACT inhaler INHALE 2 PUFFS BY MOUTH INTO THE LUNGS EVERY 6 HOURS AS NEEDED WHEEZING OR SHORTNESS OF BREATH 03/23/16   Shawnee Knapp, MD  aspirin EC 81 MG tablet Take 1 tablet (81 mg total) by mouth daily. 08/07/14   Shawnee Knapp, MD  atorvastatin (LIPITOR) 10 MG tablet TAKE 1 TABLET(10 MG) BY MOUTH DAILY 11/21/16   Shawnee Knapp, MD  Azelastine-Fluticasone [facility]) 137-50 MCG/ACT SUSP Place into the nose.    [provider]  azithromycin (ZITHROMAX) 250 MG tablet Sig as indicated 08/21/17   Horald Pollen, MD  BREO ELLIPTA 100-25 MCG/INH AEPB INL 1 PUFF PO QD 10/04/16   [provider]  cetirizine (ZYRTEC) 10 MG tablet Take 10 mg by mouth daily.    [provider]  Epinastine HCl 0.05  % ophthalmic solution Place 1 drop into both eyes 2 (two) times daily. 09/21/16   Shawnee Knapp, MD  ferrous sulfate 325 (65 FE) MG tablet Take 325 mg by mouth at bedtime.      [provider]  levocetirizine (XYZAL) 5 MG tablet Take 1 tablet by mouth daily. 03/08/17   [provider]  losartan (COZAAR) 25 MG tablet Take 1 tablet (25 mg total) by mouth daily. 05/10/17   Shawnee Knapp, MD  MAGNESIUM PO Take by mouth.    [provider]  metFORMIN (GLUCOPHAGE) 1000 MG tablet TAKE 1 TABLET(1000 MG) BY MOUTH TWICE DAILY WITH A MEAL 06/21/17   Shawnee Knapp, MD  montelukast (SINGULAIR) 10 MG tablet Take 1 tablet (10 mg total) by mouth at bedtime. 03/26/17   Ivar Drape D, PA  Multiple Vitamins-Minerals (CENTRUM ADULTS PO) Take by mouth.    [provider]  ondansetron (ZOFRAN-ODT) 8 MG disintegrating tablet Take 1 tablet (8 mg total) by mouth every 8 (eight) hours as needed for nausea or vomiting. Patient not taking: Reported on 09/12/2017 07/24/17   McVey, Gelene Mink, PA-C  promethazine-codeine [facility] WITH CODEINE) 6.25-10 MG/5ML syrup Take 5 mLs by mouth at bedtime as needed for cough. Patient not taking: Reported on 09/12/2017 08/21/17   Horald Pollen, MD  traMADol Veatrice Bourbon) 50  MG tablet TAKE 1 TABLET BY MOUTH EVERY 8 HOURS AS NEEDED 06/25/17   Shawnee Knapp, MD  traZODone (DESYREL) 50 MG tablet Take 1 tablet (50 mg total) by mouth at bedtime. 06/21/17   Shawnee Knapp, MD  triamcinolone (NASACORT) 55 MCG/ACT AERO nasal inhaler Place 2 sprays into the nose daily. 08/21/17   Horald Pollen, MD  venlafaxine XR (EFFEXOR-XR) 150 MG 24 hr capsule Take 1 capsule (150 mg total) by mouth daily with breakfast. 09/12/17   Shawnee Knapp, MD  vitamin C (ASCORBIC ACID) 500 MG tablet Take 500 mg by mouth daily.    [provider]    Past Medical History:  Diagnosis Date  . Anemia   . Anxiety   . Asthma   . Diabetes mellitus type II   . Hyperlipidemia   .  Hypertension   . NSVD (normal spontaneous vaginal delivery)    x 2    Past Surgical History:  Procedure Laterality Date  . 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 History of Abd. ultrasound  12/04   negative  . Surg. eval lap cholecstectomy  05/28/2006   deferred by patient  . TUBAL LIGATION  1993   bilateral    Social History   Tobacco Use  . Smoking status: Never Smoker  . Smokeless tobacco: Never Used  Substance Use Topics  . Alcohol use: Yes    Alcohol/week: 0.5 - 1.0 oz    Types: 1 - 2 Standard drinks or equivalent per week    Comment: Occasional beer    Family History  Problem Relation Age of Onset  . Cancer Mother        breast  . Breast cancer Mother 69       died when she was 67  . Diabetes Maternal Grandmother     ROS Per hpi  OBJECTIVE:  Blood pressure 140/80, pulse 93, temperature 98.8 F (37.1 C), temperature source Oral, height 5\' 11"  (1.803 m), weight 209 lb 9.6 oz (95.1 kg), SpO2 98 %.  Physical Exam  Gen; AAOx3, NAD Right foot: FROM, +TTP proximal heel to midfoot. Neurovascularly intact.    ASSESSMENT and PLAN  1. Plantar fasciitis of right foot Discussed supportive measures, new meds r/se/b and RTC precautions. Patient declines referral to podiatry at this time. Other orders - diclofenac (VOLTAREN) 75 MG EC tablet; Take 1 tablet (75 mg total) by mouth 2 (two) times daily. Please take with food.  Return if symptoms worsen or fail to improve.    Rutherford Guys, MD [facility] [place], [place] [id number] Ph.  [phone] Fax [phone]

## 2017-10-03 NOTE — Addendum Note (Signed)
Addended by: Shawnee Knapp on: 10/03/2017 12:07 PM   Modules accepted: Orders

## 2017-10-09 ENCOUNTER — Encounter: Payer: Self-pay | Admitting: Physician Assistant

## 2017-11-05 ENCOUNTER — Other Ambulatory Visit: Payer: Self-pay | Admitting: Family Medicine

## 2017-11-15 ENCOUNTER — Telehealth: Payer: Self-pay | Admitting: Family Medicine

## 2017-11-15 NOTE — Telephone Encounter (Signed)
Called and left pt a VM asking them to call back to the office to reschedule her appt with Dr. Brigitte Pulse on 03/14/18. Brigitte Pulse will be unavailable that day. When pt calls back in, please reschedule her for a different day with Brigitte Pulse. Thanks!

## 2017-11-17 ENCOUNTER — Other Ambulatory Visit: Payer: Self-pay | Admitting: Family Medicine

## 2017-11-19 NOTE — Telephone Encounter (Signed)
Voltaren refill Last OV:09/14/17 Last refill:09/14/17 30 tab/1 refill by Dr. Pamella Pert GYF:[facility]: [facility] [place], [place] ([address] [phone] (Phone) [phone] (Fax)

## 2017-11-28 ENCOUNTER — Other Ambulatory Visit: Payer: Self-pay | Admitting: Family Medicine

## 2017-11-28 NOTE — Telephone Encounter (Signed)
This is not a medication that is meant to be taken daily for long term as it can harm her kidneys and cause gastritis, increased risk of GI bleeding. I can refill for 30 days and would encourage her to see a podiatrist again. Please share with patient. thanks

## 2017-12-11 ENCOUNTER — Ambulatory Visit: Payer: Self-pay | Admitting: Registered Nurse

## 2017-12-11 ENCOUNTER — Encounter: Payer: Self-pay | Admitting: Registered Nurse

## 2017-12-11 VITALS — BP 134/81 | HR 84 | Temp 97.5°F

## 2017-12-11 DIAGNOSIS — S6000XA Contusion of unspecified finger without damage to nail, initial encounter: Secondary | ICD-10-CM

## 2017-12-11 MED ORDER — ACETAMINOPHEN 500 MG PO TABS: 1000.0000 mg | ORAL_TABLET | Freq: Four times a day (QID) | ORAL | 0 refills | Status: AC | PRN

## 2017-12-11 MED ORDER — MENTHOL (TOPICAL ANALGESIC) 4 % EX GEL: 1.0000 "application " | Freq: Four times a day (QID) | CUTANEOUS | 0 refills | Status: AC | PRN

## 2017-12-11 NOTE — Progress Notes (Signed)
Subjective:    Patient ID: Kelly Reed, female    DOB: [birth year], 52 y.o.   MRN: [id number]  53 y/o Caucasian female established with clinic presents as a walk in with injury to L index finger after catching/smashing finger between two carts while working, pushing one cart. Bruising, swelling noted to space between PIP and MCP joints. Injury occurred approx 69min PTA. Ice pack in place over injury. Partial ROM intact. Limited some by pain and swelling. Distal cap refill <3 sec. Color, sensation intact, WNL per RN Cheri Guppy.  Patient reported finger is throbbing/painful.  She has tramadol and diclofenac and advil at home not taking daily only prn.       Review of Systems  Constitutional: Negative for activity change, appetite change, chills, diaphoresis, fatigue and fever.  HENT: Negative for trouble swallowing and voice change.   Eyes: Negative for photophobia and visual disturbance.  Respiratory: Negative for cough, shortness of breath, wheezing and stridor.   Cardiovascular: Negative for chest pain.  Gastrointestinal: Negative for diarrhea, nausea and vomiting.  Endocrine: Negative for cold intolerance and heat intolerance.  Genitourinary: Negative for difficulty urinating.  Musculoskeletal: Positive for myalgias. Negative for gait problem, joint swelling, neck pain and neck stiffness.  Skin: Positive for color change. Negative for pallor, rash and wound.  Allergic/Immunologic: Positive for environmental allergies. Negative for food allergies.  Neurological: Negative for dizziness, tremors, seizures, syncope, facial asymmetry, speech difficulty, weakness, light-headedness, numbness and headaches.  Hematological: Negative for adenopathy. Does not bruise/bleed easily.  Psychiatric/Behavioral: Negative for agitation, confusion and sleep disturbance.       Objective:   Physical Exam  Constitutional: She is oriented to person, place, and time. Vital signs are normal. She appears  well-developed and well-nourished. She is active and cooperative.  Non-toxic appearance. She does not have a sickly appearance. She does not appear ill. No distress.  HENT:  Head: Normocephalic and atraumatic.  Right Ear: Hearing and external ear normal.  Left Ear: Hearing and external ear normal.  Nose: Nose normal.  Mouth/Throat: Uvula is midline, oropharynx is clear and moist and mucous membranes are normal. No oropharyngeal exudate.  Eyes: Pupils are equal, round, and reactive to light. Conjunctivae, EOM and lids are normal. Right eye exhibits no discharge. Left eye exhibits no discharge. No scleral icterus.  Neck: Trachea normal, normal range of motion and phonation normal. Neck supple. No tracheal deviation present.  Cardiovascular: Normal rate, regular rhythm and intact distal pulses.  Pulses:      Radial pulses are 2+ on the right side, and 2+ on the left side.  Pulmonary/Chest: Effort normal and breath sounds normal. No stridor. No respiratory distress. She has no decreased breath sounds. She has no wheezes. She has no rhonchi. She has no rales.  No cough observed in exam room; spoke full sentences without difficulty  Abdominal: Soft. Normal appearance. She exhibits no distension and no fluid wave. There is no rigidity and no guarding.  Musculoskeletal: She exhibits edema and tenderness. She exhibits no deformity.       Right shoulder: Normal.       Left shoulder: Normal.       Right elbow: Normal.      Left elbow: Normal.       Right wrist: Normal.       Left wrist: Normal.       Right hip: Normal.       Left hip: Normal.       Right knee: Normal.  Left knee: Normal.       Cervical back: Normal.       Thoracic back: Normal.       Lumbar back: Normal.       Right forearm: Normal.       Left forearm: Normal.       Right hand: Normal.       Left hand: She exhibits decreased range of motion, tenderness and swelling. She exhibits no bony tenderness, normal two-point  discrimination, normal capillary refill, no deformity and no laceration. Normal sensation noted. Normal strength noted. She exhibits no finger abduction, no thumb/finger opposition and no wrist extension trouble.       Hands: Nonpitting edema 1-2+/4 left 2nd finger anterior/palmar surface between MCP and PIP joints; able to straighten finger without difficulty; pain with flexion 90 degrees or greater; pains worsens with resistance; no defect palpated; no crepitus or trigger finger/sticking with AROM  Lymphadenopathy:    She has no cervical adenopathy.       Right cervical: No superficial cervical adenopathy present.      Left cervical: No superficial cervical adenopathy present.  Neurological: She is alert and oriented to person, place, and time. She has normal strength. She is not disoriented. She displays no atrophy and no tremor. No cranial nerve deficit or sensory deficit. She exhibits normal muscle tone. She displays no seizure activity. Coordination and gait normal. GCS eye subscore is 4. GCS verbal subscore is 5. GCS motor subscore is 6.  In/out of chair/on/off exam table without difficulty; gait sure and steady in hallway  Skin: Skin is warm, dry and intact. Capillary refill takes less than 2 seconds. Bruising and ecchymosis noted. No abrasion, no burn, no laceration, no lesion, no petechiae and no rash noted. She is not diaphoretic. No cyanosis or erythema. No pallor. Nails show no clubbing.     Psychiatric: She has a normal mood and affect. Her speech is normal and behavior is normal. Judgment and thought content normal. She is not actively hallucinating. Cognition and memory are normal. She is attentive.      Finger splint applied in clinic to left index finger by RN Hildred Alamin and secured with cobain.  Between MCP and PIP joint ecchymosis, edema 1-2+/4 anterior/palmar surface full AROM TTP no crepitus or deformity palpated or noted visually    Assessment & Plan:  A-contusion left second  finger  P-tylenol 1000mg  po QID prn pain/swelling.  Patient has diclofenac Rx 75mg  po BID prn, tramadol 50mg  po TID prn pain and advil at home.  Discussed max dose advil 800mg  po every 8 hours prn pain take with food but recommended taking tylenol first and then one of her NSAIDS if breakthrough pain not tolerable after ice/elevation.  Discussed with patient she should pick one and not take all of these within a 24 hour period as it could cause kidney injury/raise blood pressure.  Hydrate to keep urine pale clear yellow tinged.  Elevate hand if throbbing and/or apply cryotherapy.  May apply biofreeze topical QID prn pain.  finger splint wear 24/7 except to wash hand daily/prn soiling with soap and water; if finger turning blue at tip or cold prior to ice application remove/loosen cobain securing splint as may be too tight restricting blood flow and follow up with clinic staff for re-evaluation, cryotherapy 15 minutes TID and elevate if throbbing.  Given reusable ice pack from clinic stock for home use and came to clinic with instant ice pack applied to left hand.  Avoid impact to left hand  Discussed keeping left index finger straight when lifting items and avoiding direct pressure/flexion under a load.  Consider imaging if no improvement or worsening with plan of care in the next 7-10 days. exitcare finger splint, contusion, rice.  Patient verbalized understanding information/instructions, agreed with plan of care and had no further questions at this time.

## 2017-12-11 NOTE — Patient Instructions (Signed)
Splint Instructions (Immobilization)  Splint is used to support injured structures and to prevent movement so healing can take place. Do not remove until instructed to do so. If you note pressure areas or increased swelling, notify RN Hildred Alamin RICE for Routine Care of Injuries Theroutine careofmanyinjuriesincludes rest, ice, compression, and elevation (RICE therapy). RICE therapy is often recommended for injuries to soft tissues, such as a muscle strain, ligament injuries, bruises, and overuse injuries. It can also be used for some bony injuries. Using RICE therapy can help to relieve pain, lessen swelling, and enable your body to heal. Rest Rest is required to allow your body to heal. This usually involves reducing your normal activities and avoiding use of the injured part of your body. Generally, you can return to your normal activities when you are comfortable and have been given permission by your health care provider. Follow these instructions at home: Ice  Icing your injury helps to keep the swelling down, and it lessens pain. Do not apply ice directly to your skin.  Put ice in a plastic bag.  Place a towel between your skin and the bag.  Leave the ice on for 20 minutes, 2-3 times a day.  Do this for as long as you are directed by your health care provider. Compression Compression means putting pressure on the injured area. Compression helps to keep swelling down, gives support, and helps with discomfort. Compression may be done with an elastic bandage. If an elastic bandage has been applied, follow these general tips:  Remove and reapply the bandage every 3-4 hours or as directed by your health care provider.  Make sure the bandage is not wrapped too tightly, because this can cut off circulation. If part of your body beyond the bandage becomes blue, numb, cold, swollen, or more painful, your bandage is most likely too tight. If this occurs, remove your bandage and reapply it more  loosely.  See your health care provider if the bandage seems to be making your problems worse rather than better.  Elevation  Elevation means keeping the injured area raised. This helps to lessen swelling and decrease pain. If possible, your injured area should be elevated at or above the level of your heart or the center of your chest. When should I seek medical care?  If your pain and swelling continue.  If your symptoms are getting worse rather than improving. These symptoms may indicate that further evaluation or further X-rays are needed. Sometimes, X-rays may not show a small broken bone (fracture) until a number of days later. Make a follow-up appointment with your health care provider. When should I seek immediate medical care?  If you have sudden severe pain at or below the area of your injury.  If you have redness or increased swelling around your injury.  If you have tingling or numbness at or below the area of your injury that does not improve after you remove the elastic bandage. This information is not intended to replace advice given to you by your health care provider. Make sure you discuss any questions you have with your health care provider. Document Released: 10/08/2000 Document Revised: 08/07/2016 Document Reviewed: 06/03/2014 Elsevier Interactive Patient Education  2018 [place]. All rights reserved.  Contusion A contusion is a deep bruise. Contusions are the result of a blunt injury to tissues and muscle fibers under the skin. The injury causes bleeding under the skin. The skin overlying the contusion may turn blue, purple,  or yellow. Minor injuries will give you a painless contusion, but more severe contusions may stay painful and swollen for a few weeks. What are the causes? This condition is usually caused by a blow, trauma, or direct force to an area of the body. What are the signs or symptoms? Symptoms of this condition  include:  Swelling of the injured area.  Pain and tenderness in the injured area.  Discoloration. The area may have redness and then turn blue, purple, or yellow.  How is this diagnosed? This condition is diagnosed based on a physical exam and medical history. An X-ray, CT scan, or MRI may be needed to determine if there are any associated injuries, such as broken bones (fractures). How is this treated? Specific treatment for this condition depends on what area of the body was injured. In general, the best treatment for a contusion is resting, icing, applying pressure to (compression), and elevating the injured area. This is often called the RICE strategy. Over-the-counter anti-inflammatory medicines may also be recommended for pain control. Follow these instructions at home:  Rest the injured area.  If directed, apply ice to the injured area: ? Put ice in a plastic bag. ? Place a towel between your skin and the bag. ? Leave the ice on for 20 minutes, 2-3 times per day.  If directed, apply light compression to the injured area using an elastic bandage. Make sure the bandage is not wrapped too tightly. Remove and reapply the bandage as directed by your health care provider.  If possible, raise (elevate) the injured area above the level of your heart while you are sitting or lying down.  Take over-the-counter and prescription medicines only as told by your health care provider. Contact a health care provider if:  Your symptoms do not improve after several days of treatment.  Your symptoms get worse.  You have difficulty moving the injured area. Get help right away if:  You have severe pain.  You have numbness in a hand or foot.  Your hand or foot turns pale or cold. This information is not intended to replace advice given to you by your health care provider. Make sure you discuss any questions you have with your health care provider. Document Released: 04/05/2005 Document  Revised: 11/04/2015 Document Reviewed: 11/11/2014 Elsevier Interactive Patient Education  [employer].

## 2017-12-13 ENCOUNTER — Ambulatory Visit: Payer: Self-pay | Admitting: *Deleted

## 2017-12-26 ENCOUNTER — Ambulatory Visit: Payer: PRIVATE HEALTH INSURANCE | Admitting: Family Medicine

## 2017-12-26 ENCOUNTER — Encounter: Payer: Self-pay | Admitting: Family Medicine

## 2017-12-26 VITALS — BP 126/82 | HR 83 | Temp 98.0°F | Resp 17 | Ht 71.0 in | Wt 215.0 lb

## 2017-12-26 DIAGNOSIS — R0789 Other chest pain: Secondary | ICD-10-CM

## 2017-12-26 DIAGNOSIS — J0121 Acute recurrent ethmoidal sinusitis: Secondary | ICD-10-CM | POA: Diagnosis not present

## 2017-12-26 DIAGNOSIS — J4541 Moderate persistent asthma with (acute) exacerbation: Secondary | ICD-10-CM | POA: Diagnosis not present

## 2017-12-26 MED ORDER — METHYLPREDNISOLONE ACETATE 80 MG/ML IJ SUSP
120.0000 mg | Freq: Once | INTRAMUSCULAR | Status: AC
  Administered 2017-12-26: 120 mg via INTRAMUSCULAR

## 2017-12-26 MED ORDER — AZITHROMYCIN 250 MG PO TABS
ORAL_TABLET | ORAL | 0 refills | Status: DC
Start: 2017-12-26 — End: 2018-01-02

## 2017-12-26 MED ORDER — IPRATROPIUM BROMIDE 0.02 % IN SOLN
0.5000 mg | Freq: Once | RESPIRATORY_TRACT | Status: AC
  Administered 2017-12-26: 0.5 mg via RESPIRATORY_TRACT

## 2017-12-26 MED ORDER — LEVALBUTEROL HCL 0.63 MG/3ML IN NEBU
0.6300 mg | INHALATION_SOLUTION | Freq: Once | RESPIRATORY_TRACT | Status: AC
  Administered 2017-12-26: 0.63 mg via RESPIRATORY_TRACT

## 2017-12-26 NOTE — Progress Notes (Signed)
Subjective:  By signing my name below, I, [name], attest that this documentation has been prepared under the direction and in the presence of [name], MD Electronically Signed: [name], ED Scribe 12/26/2017 at 5:42 PM.   Patient ID: Kelly Reed, female    DOB: Apr 08, 1966, 52 y.o.   MRN: [id number]  Chief Complaint  Patient presents with  . URI   HPI Kelly Reed is a 52 y.o. female who presents to Primary Care at [facility] complaining of URI symptoms. Pt reports symptoms of fatigue, chest congestion, low-grade fever 2 days ago that has resolved, nasal congestion, clear/yellow rhinorrhea that has improved, sinus pain/pressure, occasional cough, some chest tightness, mild wheezing this morning, sore throat that has resolved. Pt has tried hycodan, Mucinex DM x 2 days and Breo. Denies lightheadedness, dizziness, sob. Pt reports that her husband was ill with similar symptoms 3 days ago. States she has not had her allergy shot yet this wk.  Past Medical History:  Diagnosis Date  . Anemia   . Anxiety   . Asthma   . Diabetes mellitus type II   . Hyperlipidemia   . Hypertension   . NSVD (normal spontaneous vaginal delivery)    x 2   Current Outpatient Medications on File Prior to Visit  Medication Sig Dispense Refill  . albuterol (VENTOLIN HFA) 108 (90 Base) MCG/ACT inhaler INHALE 2 PUFFS BY MOUTH INTO THE LUNGS EVERY 6 HOURS AS NEEDED WHEEZING OR SHORTNESS OF BREATH 18 g 5  . aspirin EC 81 MG tablet Take 1 tablet (81 mg total) by mouth daily.    Marland Kitchen atorvastatin (LIPITOR) 10 MG tablet TAKE 1 TABLET(10 MG) BY MOUTH DAILY 90 tablet 1  . Azelastine-Fluticasone (DYMISTA) 137-50 MCG/ACT SUSP Place into the nose.    Marland Kitchen BREO ELLIPTA 200-25 MCG/INH AEPB INL 1 PUFF PO QD  2  . cetirizine (ZYRTEC) 10 MG tablet Take 5 mg by mouth daily.     . diclofenac (VOLTAREN) 75 MG EC tablet TAKE 1 TABLET(75 MG) BY MOUTH TWICE DAILY WITH FOOD 60 tablet 0  . Epinastine HCl 0.05 % ophthalmic solution  Place 1 drop into both eyes 2 (two) times daily. 5 mL 2  . ferrous sulfate 325 (65 FE) MG tablet Take 325 mg by mouth every other day.     . levocetirizine (XYZAL) 5 MG tablet Take 1 tablet by mouth every evening.   9  . losartan (COZAAR) 25 MG tablet TAKE 1 TABLET(25 MG) BY MOUTH DAILY 90 tablet 0  . MAGNESIUM PO Take by mouth.    . metFORMIN (GLUCOPHAGE) 1000 MG tablet TAKE 1 TABLET(1000 MG) BY MOUTH TWICE DAILY WITH A MEAL 180 tablet 3  . montelukast (SINGULAIR) 10 MG tablet Take 1 tablet (10 mg total) by mouth at bedtime. 90 tablet 3  . Multiple Vitamins-Minerals (CENTRUM ADULTS PO) Take by mouth.    . traMADol (ULTRAM) 50 MG tablet TAKE 1 TABLET BY MOUTH EVERY 8 HOURS AS NEEDED 30 tablet 0  . traZODone (DESYREL) 50 MG tablet Take 1 tablet (50 mg total) by mouth at bedtime. 90 tablet 1  . triamcinolone (NASACORT) 55 MCG/ACT AERO nasal inhaler Place 2 sprays into the nose daily. 1 Inhaler 12  . venlafaxine XR (EFFEXOR-XR) 150 MG 24 hr capsule Take 1 capsule (150 mg total) by mouth daily with breakfast. 90 capsule 1  . vitamin C (ASCORBIC ACID) 500 MG tablet Take 500 mg by mouth daily.     No current  facility-administered medications on file prior to visit.    Allergies  Allergen Reactions  . Adhesive [Tape] Rash and Other (See Comments)    Peeling of skin  . Prednisone     Hyperglycemia with oral steroids   Review of Systems  Constitutional: Positive for fatigue and fever.  HENT: Positive for congestion, rhinorrhea, sinus pressure, sinus pain and sore throat (resolved).   Respiratory: Positive for cough, chest tightness and wheezing. Negative for shortness of breath.   Neurological: Negative for dizziness and light-headedness.      Objective:   Physical Exam  Constitutional: She is oriented to person, place, and time. She appears well-developed and well-nourished. No distress.  HENT:  Head: Normocephalic and atraumatic.  Right Ear: Tympanic membrane is retracted.  Left Ear:  Tympanic membrane normal.  Nose: Mucosal edema present.  Mouth/Throat: Posterior oropharyngeal erythema present.  Eyes: Conjunctivae and EOM are normal.  Neck: Neck supple. No tracheal deviation present.  Cardiovascular: Regular rhythm and normal heart sounds. Tachycardia present.  Pulmonary/Chest: Effort normal. No respiratory distress.  Decreased air movement throughout but lung fields clear.  Musculoskeletal: Normal range of motion.  Lymphadenopathy:    She has cervical adenopathy (bilateral).  Neurological: She is alert and oriented to person, place, and time.  Skin: Skin is warm. She is diaphoretic.  Psychiatric: She has a normal mood and affect. Her behavior is normal.  Nursing note and vitals reviewed.   Vitals:   12/26/17 1650 12/26/17 1801  BP: (!) 143/87 126/82  Pulse: (!) 123 83  Resp: 17   Temp: 98 F (36.7 C)   TempSrc: Oral   SpO2: 98% 97%  Weight: 215 lb (97.5 kg)   Height: 5\' 11"  (1.803 m)    Assessment & Plan:  Ok to call for medrol dose pack if chest tightness reoccurs. 1. Chest tightness   2. Moderate persistent asthma with acute exacerbation   3. Acute recurrent ethmoidal sinusitis     Orders Placed This Encounter  Procedures  . Care order/instruction:    Scheduling Instructions:     Recheck BP  . Pulse oximetry (single)    Meds ordered this encounter  Medications  . levalbuterol (XOPENEX) nebulizer solution 0.63 mg  . ipratropium (ATROVENT) nebulizer solution 0.5 mg  . azithromycin (ZITHROMAX) 250 MG tablet    Sig: Take 2 tabs PO x 1 dose, then 1 tab PO QD x 4 days    Dispense:  6 tablet    Refill:  0  . methylPREDNISolone acetate (DEPO-MEDROL) injection 120 mg    I personally performed the services described in this documentation, which was scribed in my presence. The recorded information has been reviewed and considered, and addended by me as needed.   [name], M.D.  Primary Care at [facility] [address] [place],  [place] [address] [phone] phone [phone] fax  12/31/17 6:02 PM

## 2017-12-26 NOTE — Patient Instructions (Addendum)
   IF you received an x-ray today, you will receive an invoice from [facility]. Please contact [facility] at [phone] with questions or concerns regarding your invoice.   IF you received labwork today, you will receive an invoice from [facility]. Please contact [facility] at [phone] with questions or concerns regarding your invoice.   Our billing staff will not be able to assist you with questions regarding bills from these companies.  You will be contacted with the lab results as soon as they are available. The fastest way to get your results is to activate your My Chart account. Instructions are located on the last page of this paperwork. If you have not heard from us regarding the results in 2 weeks, please contact this office.      Asthma, Acute Bronchospasm Acute bronchospasm caused by asthma is also referred to as an asthma attack. Bronchospasm means your air passages become narrowed. The narrowing is caused by inflammation and tightening of the muscles in the air tubes (bronchi) in your lungs. This can make it hard to breathe or cause you to wheeze and cough. What are the causes? Possible triggers are:  Animal dander from the skin, hair, or feathers of animals.  Dust mites contained in house dust.  Cockroaches.  Pollen from trees or grass.  Mold.  Cigarette or tobacco smoke.  Air pollutants such as dust, household cleaners, hair sprays, aerosol sprays, paint fumes, strong chemicals, or strong odors.  Cold air or weather changes. Cold air may trigger inflammation. Winds increase molds and pollens in the air.  Strong emotions such as crying or laughing hard.  Stress.  Certain medicines such as aspirin or beta-blockers.  Sulfites in foods and drinks, such as dried fruits and wine.  Infections or inflammatory conditions, such as a flu, cold, or inflammation of the nasal membranes (rhinitis).  Gastroesophageal reflux disease (GERD). GERD is a  condition where stomach acid backs up into your esophagus.  Exercise or strenuous activity. What are the signs or symptoms?  Wheezing.  Excessive coughing, particularly at night.  Chest tightness.  Shortness of breath. How is this diagnosed? Your health care provider will ask you about your medical history and perform a physical exam. A chest X-ray or blood testing may be performed to look for other causes of your symptoms or other conditions that may have triggered your asthma attack. How is this treated? Treatment is aimed at reducing inflammation and opening up the airways in your lungs. Most asthma attacks are treated with inhaled medicines. These include quick relief or rescue medicines (such as bronchodilators) and controller medicines (such as inhaled corticosteroids). These medicines are sometimes given through an inhaler or a nebulizer. Systemic steroid medicine taken by mouth or given through an IV tube also can be used to reduce the inflammation when an attack is moderate or severe. Antibiotic medicines are only used if a bacterial infection is present. Follow these instructions at home:  Rest.  Drink plenty of liquids. This helps the mucus to remain thin and be easily coughed up. Only use caffeine in moderation and do not use alcohol until you have recovered from your illness.  Do not smoke. Avoid being exposed to secondhand smoke.  You play a critical role in keeping yourself in good health. Avoid exposure to things that cause you to wheeze or to have breathing problems.  Keep your medicines up-to-date and available. Carefully follow your health care provider's treatment plan.  Take your medicine exactly as prescribed.    When pollen or pollution is bad, keep windows closed and use an air conditioner or go to places with air conditioning.  Asthma requires careful medical care. See your health care provider for a follow-up as advised. If you are more than [redacted] weeks pregnant  and you were prescribed any new medicines, let your obstetrician know about the visit and how you are doing. Follow up with your health care provider as directed.  After you have recovered from your asthma attack, make an appointment with your outpatient doctor to talk about ways to reduce the likelihood of future attacks. If you do not have a doctor who manages your asthma, make an appointment with a primary care doctor to discuss your asthma. Get help right away if:  You are getting worse.  You have trouble breathing. If severe, call your local emergency services (911 in the [place]  You develop chest pain or discomfort.  You are vomiting.  You are not able to keep fluids down.  You are coughing up yellow, green, brown, or bloody sputum.  You have a fever and your symptoms suddenly get worse.  You have trouble swallowing. This information is not intended to replace advice given to you by your health care provider. Make sure you discuss any questions you have with your health care provider. Document Released: 10/11/2006 Document Revised: 12/08/2015 Document Reviewed: 01/01/2013 Elsevier Interactive Patient Education  2017 Elsevier Inc.  

## 2017-12-31 ENCOUNTER — Encounter: Payer: Self-pay | Admitting: Family Medicine

## 2017-12-31 ENCOUNTER — Other Ambulatory Visit: Payer: Self-pay

## 2017-12-31 ENCOUNTER — Ambulatory Visit: Payer: PRIVATE HEALTH INSURANCE | Admitting: Family Medicine

## 2017-12-31 VITALS — BP 134/80 | HR 86 | Temp 97.5°F | Resp 17 | Ht 71.0 in | Wt 213.6 lb

## 2017-12-31 DIAGNOSIS — J0121 Acute recurrent ethmoidal sinusitis: Secondary | ICD-10-CM | POA: Diagnosis not present

## 2017-12-31 DIAGNOSIS — J4541 Moderate persistent asthma with (acute) exacerbation: Secondary | ICD-10-CM | POA: Diagnosis not present

## 2017-12-31 MED ORDER — ALBUTEROL SULFATE HFA 108 (90 BASE) MCG/ACT IN AERS: INHALATION_SPRAY | RESPIRATORY_TRACT | 5 refills | Status: DC

## 2017-12-31 MED ORDER — AMOXICILLIN-POT CLAVULANATE 875-125 MG PO TABS: 1.0000 | ORAL_TABLET | Freq: Two times a day (BID) | ORAL | 0 refills | Status: DC

## 2017-12-31 MED ORDER — PREDNISONE 20 MG PO TABS: ORAL_TABLET | ORAL | 0 refills | Status: DC

## 2017-12-31 NOTE — Patient Instructions (Addendum)
   IF you received an x-ray today, you will receive an invoice from [facility]. Please contact [facility] at [phone] with questions or concerns regarding your invoice.   IF you received labwork today, you will receive an invoice from [facility]. Please contact [facility] at [phone] with questions or concerns regarding your invoice.   Our billing staff will not be able to assist you with questions regarding bills from these companies.  You will be contacted with the lab results as soon as they are available. The fastest way to get your results is to activate your My Chart account. Instructions are located on the last page of this paperwork. If you have not heard from us regarding the results in 2 weeks, please contact this office.      Sinusitis, Adult Sinusitis is soreness and inflammation of your sinuses. Sinuses are hollow spaces in the bones around your face. Your sinuses are located:  Around your eyes.  In the middle of your forehead.  Behind your nose.  In your cheekbones.  Your sinuses and nasal passages are lined with a stringy fluid (mucus). Mucus normally drains out of your sinuses. When your nasal tissues become inflamed or swollen, the mucus can become trapped or blocked so air cannot flow through your sinuses. This allows bacteria, viruses, and funguses to grow, which leads to infection. Sinusitis can develop quickly and last for 7?10 days (acute) or for more than 12 weeks (chronic). Sinusitis often develops after a cold. What are the causes? This condition is caused by anything that creates swelling in the sinuses or stops mucus from draining, including:  Allergies.  Asthma.  Bacterial or viral infection.  Abnormally shaped bones between the nasal passages.  Nasal growths that contain mucus (nasal polyps).  Narrow sinus openings.  Pollutants, such as chemicals or irritants in the air.  A foreign object stuck in the nose.  A fungal  infection. This is rare.  What increases the risk? The following factors may make you more likely to develop this condition:  Having allergies or asthma.  Having had a recent cold or respiratory tract infection.  Having structural deformities or blockages in your nose or sinuses.  Having a weak immune system.  Doing a lot of swimming or diving.  Overusing nasal sprays.  Smoking.  What are the signs or symptoms? The main symptoms of this condition are pain and a feeling of pressure around the affected sinuses. Other symptoms include:  Upper toothache.  Earache.  Headache.  Bad breath.  Decreased sense of smell and taste.  A cough that may get worse at night.  Fatigue.  Fever.  Thick drainage from your nose. The drainage is often green and it may contain pus (purulent).  Stuffy nose or congestion.  Postnasal drip. This is when extra mucus collects in the throat or back of the nose.  Swelling and warmth over the affected sinuses.  Sore throat.  Sensitivity to light.  How is this diagnosed? This condition is diagnosed based on symptoms, a medical history, and a physical exam. To find out if your condition is acute or chronic, your health care provider may:  Look in your nose for signs of nasal polyps.  Tap over the affected sinus to check for signs of infection.  View the inside of your sinuses using an imaging device that has a light attached (endoscope).  If your health care provider suspects that you have chronic sinusitis, you may also:  Be tested for allergies.    Have a sample of mucus taken from your nose (nasal culture) and checked for bacteria.  Have a mucus sample examined to see if your sinusitis is related to an allergy.  If your sinusitis does not respond to treatment and it lasts longer than 8 weeks, you may have an MRI or CT scan to check your sinuses. These scans also help to determine how severe your infection is. In rare cases, a bone  biopsy may be done to rule out more serious types of fungal sinus disease. How is this treated? Treatment for sinusitis depends on the cause and whether your condition is chronic or acute. If a virus is causing your sinusitis, your symptoms will go away on their own within 10 days. You may be given medicines to relieve your symptoms, including:  Topical nasal decongestants. They shrink swollen nasal passages and let mucus drain from your sinuses.  Antihistamines. These drugs block inflammation that is triggered by allergies. This can help to ease swelling in your nose and sinuses.  Topical nasal corticosteroids. These are nasal sprays that ease inflammation and swelling in your nose and sinuses.  Nasal saline washes. These rinses can help to get rid of thick mucus in your nose.  If your condition is caused by bacteria, you will be given an antibiotic medicine. If your condition is caused by a fungus, you will be given an antifungal medicine. Surgery may be needed to correct underlying conditions, such as narrow nasal passages. Surgery may also be needed to remove polyps. Follow these instructions at home: Medicines  Take, use, or apply over-the-counter and prescription medicines only as told by your health care provider. These may include nasal sprays.  If you were prescribed an antibiotic medicine, take it as told by your health care provider. Do not stop taking the antibiotic even if you start to feel better. Hydrate and Humidify  Drink enough water to keep your urine clear or pale yellow. Staying hydrated will help to thin your mucus.  Use a cool mist humidifier to keep the humidity level in your home above 50%.  Inhale steam for 10-15 minutes, 3-4 times a day or as told by your health care provider. You can do this in the bathroom while a hot shower is running.  Limit your exposure to cool or dry air. Rest  Rest as much as possible.  Sleep with your head raised  (elevated).  Make sure to get enough sleep each night. General instructions  Apply a warm, moist washcloth to your face 3-4 times a day or as told by your health care provider. This will help with discomfort.  Wash your hands often with soap and water to reduce your exposure to viruses and other germs. If soap and water are not available, use hand sanitizer.  Do not smoke. Avoid being around people who are smoking (secondhand smoke).  Keep all follow-up visits as told by your health care provider. This is important. Contact a health care provider if:  You have a fever.  Your symptoms get worse.  Your symptoms do not improve within 10 days. Get help right away if:  You have a severe headache.  You have persistent vomiting.  You have pain or swelling around your face or eyes.  You have vision problems.  You develop confusion.  Your neck is stiff.  You have trouble breathing. This information is not intended to replace advice given to you by your health care provider. Make sure you discuss any questions you   have with your health care provider. Document Released: 06/26/2005 Document Revised: 02/20/2016 Document Reviewed: 04/21/2015 Elsevier Interactive Patient Education  2018 [employer]  

## 2017-12-31 NOTE — Progress Notes (Addendum)
Subjective:  By signing my name below, I, Kelly Reed, attest that this documentation has been prepared under the direction and in the presence of [name], MD. Electronically Signed: Moises Reed, [place]. 12/31/2017 , 6:15 PM .  Patient was seen in Room 2 .   Patient ID: CHRYSTEN Reed, female    DOB: 1965/10/12, 52 y.o.   MRN: [id number] Chief Complaint  Patient presents with  . not better one week f/u    chest tightness a little, sinus pain and pressure and achy, started feeling bad again on Saturday 12/29/17   HPI Kelly Reed is a 52 y.o. female who presents to Primary Care at [facility] for follow up. Patient was seen 1 week ago, having an asthma exacerbation. She was given Depo-Medrol and duo neb, as was given zpak to take in case if symptoms worsened.   Patient states she felt better but started feeling worse again 2 nights ago. She notes having some fever and chills 2 nights ago into yesterday morning, but none today. She informs nares being congested, but unable to blow anything out. She noticed chest tightness return as well. She took the zpak and finished it. She also describes her throat isn't truly sore, but hard to swallow like it's irritated. She's been able to rest though. She's been using her Breo inhaler, last used this morning. She's been using her ventolin inhaler, last used it once at work. She was referred to Dr. Benjamine Mola.   Her MGM (who raised her so was like her mother - Meleah' mother passed away at 28 yo of breast cancer when Jennavieve was just 54 yo) passed away on May 29rd at 54 years old. Patient states she's doing okay. But, now she doesn't know what she can do on the weekends as driving several yrs to spend the whole wkend caring for her MGM has been her whole life for many yrs - just work, then drive to [place] - so hardly ever saw husband - now she has every weekend empty suddenly which is what she has been wanting for so long but does feel a little empty/aimless. Her  pre-teen niece keeps requesting her to drive back out to [place] part of state (where her MGM lived) again to visit her but she has been to ill but knows in a few yrs when niece is a teen she won't want to hang out w/ her on wkends so feels like she should take advantage of it.  Past Medical History:  Diagnosis Date  . Anemia   . Anxiety   . Asthma   . Diabetes mellitus type II   . Hyperlipidemia   . Hypertension   . NSVD (normal spontaneous vaginal delivery)    x 2   Past Surgical History:  Procedure Laterality Date  . 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 History of Abd. ultrasound  12/04   negative  . Surg. eval lap cholecstectomy  05/28/2006   deferred by patient  . TUBAL LIGATION  1993   bilateral   Prior to Admission medications   Medication Sig Start Date End Date Taking? Authorizing Provider  albuterol (VENTOLIN HFA) 108 (90 Base) MCG/ACT inhaler INHALE 2 PUFFS BY MOUTH INTO THE LUNGS EVERY 6 HOURS AS NEEDED WHEEZING OR SHORTNESS OF BREATH 03/23/16   Shawnee Knapp, MD  aspirin EC 81 MG tablet Take 1 tablet (81 mg total) by mouth daily. 08/07/14   Shawnee Knapp, MD  atorvastatin (LIPITOR)  10 MG tablet TAKE 1 TABLET(10 MG) BY MOUTH DAILY 11/21/16   Shawnee Knapp, MD  Azelastine-Fluticasone [facility]) 137-50 MCG/ACT SUSP Place into the nose.    [provider]  azithromycin (ZITHROMAX) 250 MG tablet Take 2 tabs PO x 1 dose, then 1 tab PO QD x 4 days 12/26/17   Shawnee Knapp, MD  BREO ELLIPTA 200-25 MCG/INH AEPB INL 1 PUFF PO QD 11/24/17   [provider]  cetirizine (ZYRTEC) 10 MG tablet Take 5 mg by mouth daily.     [provider]  diclofenac (VOLTAREN) 75 MG EC tablet TAKE 1 TABLET(75 MG) BY MOUTH TWICE DAILY WITH FOOD 11/22/17   Rutherford Guys, MD  Epinastine HCl 0.05 % ophthalmic solution Place 1 drop into both eyes 2 (two) times daily. 09/21/16   Shawnee Knapp, MD  ferrous sulfate 325 (65 FE) MG tablet Take 325 mg by mouth every other day.      [provider]  levocetirizine (XYZAL) 5 MG tablet Take 1 tablet by mouth every evening.  03/08/17   [provider]  losartan (COZAAR) 25 MG tablet TAKE 1 TABLET(25 MG) BY MOUTH DAILY 11/05/17   Shawnee Knapp, MD  MAGNESIUM PO Take by mouth.    [provider]  metFORMIN (GLUCOPHAGE) 1000 MG tablet TAKE 1 TABLET(1000 MG) BY MOUTH TWICE DAILY WITH A MEAL 06/21/17   Shawnee Knapp, MD  montelukast (SINGULAIR) 10 MG tablet Take 1 tablet (10 mg total) by mouth at bedtime. 03/26/17   Ivar Drape D, PA  Multiple Vitamins-Minerals (CENTRUM ADULTS PO) Take by mouth.    [provider]  traMADol (ULTRAM) 50 MG tablet TAKE 1 TABLET BY MOUTH EVERY 8 HOURS AS NEEDED 06/25/17   Shawnee Knapp, MD  traZODone (DESYREL) 50 MG tablet Take 1 tablet (50 mg total) by mouth at bedtime. 06/21/17   Shawnee Knapp, MD  triamcinolone (NASACORT) 55 MCG/ACT AERO nasal inhaler Place 2 sprays into the nose daily. 08/21/17   Horald Pollen, MD  venlafaxine XR (EFFEXOR-XR) 150 MG 24 hr capsule Take 1 capsule (150 mg total) by mouth daily with breakfast. 09/12/17   Shawnee Knapp, MD  vitamin C (ASCORBIC ACID) 500 MG tablet Take 500 mg by mouth daily.    [provider]   Allergies  Allergen Reactions  . Adhesive [Tape] Rash and Other (See Comments)    Peeling of skin  . Prednisone     Hyperglycemia with oral steroids   Family History  Problem Relation Age of Onset  . Cancer Mother        breast  . Breast cancer Mother 51       died when she was 37  . Diabetes Maternal Grandmother    Social History   Socioeconomic History  . Marital status: Married    Spouse name: Not on file  . Number of children: 2  . Years of education: Not on file  . Highest education level: Not on file  Occupational History  . Occupation: Retail buyer.    Employer: [employer]  Social Needs  . Financial resource strain: Not on file  . Food insecurity:    Worry: Not on file    Inability:  Not on file  . Transportation needs:    Medical: Not on file    Non-medical: Not on file  Tobacco Use  . Smoking status: Never Smoker  . Smokeless tobacco: Never Used  Substance and Sexual Activity  .  Alcohol use: Yes    Alcohol/week: 0.6 - 1.2 oz    Types: 1 - 2 Standard drinks or equivalent per week    Comment: Occasional beer  . Drug use: No  . Sexual activity: Never    Partners: Male    Birth control/protection: Surgical  Lifestyle  . Physical activity:    Days per week: Not on file    Minutes per session: Not on file  . Stress: Not on file  Relationships  . Social connections:    Talks on phone: Not on file    Gets together: Not on file    Attends religious service: Not on file    Active member of club or organization: Not on file    Attends meetings of clubs or organizations: Not on file    Relationship status: Not on file  Other Topics Concern  . Not on file  Social History Narrative   2 Sons   Works at [employer]   Married   Depression screen [facility] 2/9 12/31/2017 12/26/2017 09/14/2017 09/12/2017 08/21/2017  Decreased Interest 0 0 0 0 0  Down, Depressed, Hopeless 0 0 0 0 0  PHQ - 2 Score 0 0 0 0 0    Review of Systems  Constitutional: Positive for chills, diaphoresis ("clammy") and fever. Negative for fatigue and unexpected weight change.  HENT: Positive for congestion, sinus pressure, sinus pain and trouble swallowing. Negative for rhinorrhea and sore throat (more "irritated").   Respiratory: Positive for chest tightness. Negative for cough.   Gastrointestinal: Negative for constipation, diarrhea, nausea and vomiting.  Musculoskeletal: Positive for myalgias.  Skin: Negative for rash and wound.  Neurological: Negative for dizziness, weakness and headaches.       Objective:   Physical Exam  Constitutional: She is oriented to person, place, and time. She appears well-developed and well-nourished. No distress.  Diaphoretic, clammy  HENT:  Head:  Normocephalic and atraumatic.  Right Ear: Tympanic membrane normal.  Left Ear: Tympanic membrane normal.  Nose: Nose normal.  Mouth/Throat: Oropharynx is clear and moist.  Eyes: Pupils are equal, round, and reactive to light. EOM are normal.  Neck: Neck supple.  Cardiovascular: Normal rate.  Pulmonary/Chest: Effort normal and breath sounds normal. No respiratory distress.  Musculoskeletal: Normal range of motion.  Lymphadenopathy:    She has cervical adenopathy (anterior, bilaterally).  Neurological: She is alert and oriented to person, place, and time.  Skin: Skin is warm and dry.  Psychiatric: She has a normal mood and affect. Her behavior is normal.  Nursing note and vitals reviewed.   BP 134/80 (BP Location: Left Arm, Patient Position: Sitting, Cuff Size: Large)   Pulse 86   Temp (!) 97.5 F (36.4 C) (Oral)   Resp 17   Ht 5\' 11"  (1.803 m)   Wt 213 lb 9.6 oz (96.9 kg)   SpO2 95%   BMI 29.79 kg/m       Assessment & Plan:   1. Acute recurrent ethmoidal sinusitis   2. Moderate persistent asthma with acute exacerbation     Meds ordered this encounter  Medications  . albuterol (VENTOLIN HFA) 108 (90 Base) MCG/ACT inhaler    Sig: INHALE 2 PUFFS BY MOUTH INTO THE LUNGS EVERY 6 HOURS AS NEEDED WHEEZING OR SHORTNESS OF BREATH    Dispense:  18 g    Refill:  5  . amoxicillin-clavulanate (AUGMENTIN) 875-125 MG tablet    Sig: Take 1 tablet by mouth 2 (two) times daily.    Dispense:  20 tablet    Refill:  0  . predniSONE (DELTASONE) 20 MG tablet    Sig: Take 3 tabs qd x 3d, then 2 tabs qd x 3d then 1 tab qd x 3d.    Dispense:  18 tablet    Refill:  0    I personally performed the services described in this documentation, which was scribed in my presence. The recorded information has been reviewed and considered, and addended by me as needed.   [name], M.D.  Primary Care at [facility] [address] [place], [place] [address] [phone] phone [phone] fax  01/04/18 5:38 AM

## 2018-01-02 ENCOUNTER — Encounter: Payer: Self-pay | Admitting: Physician Assistant

## 2018-01-02 ENCOUNTER — Ambulatory Visit: Payer: PRIVATE HEALTH INSURANCE | Admitting: Physician Assistant

## 2018-01-02 ENCOUNTER — Ambulatory Visit: Payer: Self-pay | Admitting: *Deleted

## 2018-01-02 ENCOUNTER — Telehealth: Payer: Self-pay | Admitting: Family Medicine

## 2018-01-02 ENCOUNTER — Other Ambulatory Visit: Payer: Self-pay

## 2018-01-02 VITALS — BP 120/70 | HR 116 | Temp 99.5°F | Resp 18 | Ht 71.0 in | Wt 210.6 lb

## 2018-01-02 DIAGNOSIS — J0121 Acute recurrent ethmoidal sinusitis: Secondary | ICD-10-CM

## 2018-01-02 DIAGNOSIS — J4541 Moderate persistent asthma with (acute) exacerbation: Secondary | ICD-10-CM | POA: Diagnosis not present

## 2018-01-02 DIAGNOSIS — R0789 Other chest pain: Secondary | ICD-10-CM | POA: Diagnosis not present

## 2018-01-02 DIAGNOSIS — R059 Cough, unspecified: Secondary | ICD-10-CM

## 2018-01-02 DIAGNOSIS — R05 Cough: Secondary | ICD-10-CM | POA: Diagnosis not present

## 2018-01-02 MED ORDER — IPRATROPIUM BROMIDE 0.02 % IN SOLN
0.5000 mg | Freq: Once | RESPIRATORY_TRACT | Status: AC
  Administered 2018-01-02: 0.5 mg via RESPIRATORY_TRACT

## 2018-01-02 MED ORDER — IPRATROPIUM-ALBUTEROL 20-100 MCG/ACT IN AERS: 1.0000 | INHALATION_SPRAY | Freq: Four times a day (QID) | RESPIRATORY_TRACT | 0 refills | Status: DC

## 2018-01-02 MED ORDER — HYDROCOD POLST-CPM POLST ER 10-8 MG/5ML PO SUER: 5.0000 mL | Freq: Two times a day (BID) | ORAL | 0 refills | Status: DC

## 2018-01-02 MED ORDER — ALBUTEROL SULFATE (2.5 MG/3ML) 0.083% IN NEBU
2.5000 mg | INHALATION_SOLUTION | Freq: Once | RESPIRATORY_TRACT | Status: AC
  Administered 2018-01-02: 2.5 mg via RESPIRATORY_TRACT

## 2018-01-02 NOTE — Progress Notes (Signed)
Kelly Reed  MRN: [id number] DOB: [birth year]  PCP: Shawnee Knapp, MD  Chief Complaint  Patient presents with  . Wheezing    follow up still not feeling great, note from shaw says she can go back to work tomorrow but pt doesnt think she can     Subjective:  Kelly Reed is a 52 year old female with h/o asthma presenting for follow up of chest tightness and wheezing that started today. She was diagnosed with sinusitis on Monday (6/24) and prescribed Augmentin. She notes symptoms seemed to get better over the last 3 daysbut worsened this morning with chest tightness, wheezing, achyness, and feeling of bronchospasm. She does not feel SOB and does not complain of chest pain.  She has taken 2 doses of ventolin today which has not helped.  Does not feel like she can return to work tomorrow - she is exhausted Denies acute cardiac related chest pain. Upon inquiry she affirms that she was unable to sleep much last night, but after finally getting to sleep, she awoke in the morning due to cough and then noticed the chest tightness. She describes difficulty breathing that seems to be related to airway movement.   She has not had her allergy injections the last 2 weeks due to illness.  She has been taking her singulair daily as well as her Breo.  It has been a long time since she has had an asthma flair.  History is obtained by patient.  Review of Systems  Constitutional: Positive for fever (low grade).  HENT: Positive for congestion, ear pain, sinus pressure and trouble swallowing (w/o choking). Negative for sore throat.   Respiratory: Positive for cough, chest tightness (pulmonary related), shortness of breath and wheezing.   Cardiovascular: Negative for chest pain and palpitations.  Gastrointestinal: Negative for constipation, diarrhea and vomiting.  Genitourinary: Negative for difficulty urinating.    Patient Active Problem List   Diagnosis Date Noted  . Cough 08/21/2017  . Lower  respiratory infection 08/21/2017  . Sinus congestion 08/21/2017  . History of asthma 08/21/2017  . Asthma 09/20/2016  . Chronic seasonal allergic rhinitis 09/20/2016  . Anemia, iron deficiency 08/07/2014  . Allergic reaction to chemical substance 08/07/2014  . Muscle cramp, nocturnal 08/07/2014  . Essential hypertension, benign 12/07/2012  . Polypharmacy 12/07/2012  . Ganglion cyst 10/22/2012  . Hypercholesteremia 05/04/2011  . Anxiety and depression 02/01/2011  . EDEMA 03/04/2009  . Allergic state 11/16/2006  . Diabetes mellitus, type II ([place]) 10/25/2006    Current Outpatient Medications on File Prior to Visit  Medication Sig Dispense Refill  . albuterol (VENTOLIN HFA) 108 (90 Base) MCG/ACT inhaler INHALE 2 PUFFS BY MOUTH INTO THE LUNGS EVERY 6 HOURS AS NEEDED WHEEZING OR SHORTNESS OF BREATH 18 g 5  . amoxicillin-clavulanate (AUGMENTIN) 875-125 MG tablet Take 1 tablet by mouth 2 (two) times daily. 20 tablet 0  . aspirin EC 81 MG tablet Take 1 tablet (81 mg total) by mouth daily.    Marland Kitchen atorvastatin (LIPITOR) 10 MG tablet TAKE 1 TABLET(10 MG) BY MOUTH DAILY 90 tablet 1  . Azelastine-Fluticasone (DYMISTA) 137-50 MCG/ACT SUSP Place into the nose.    Marland Kitchen azithromycin (ZITHROMAX) 250 MG tablet Take 2 tabs PO x 1 dose, then 1 tab PO QD x 4 days 6 tablet 0  . BREO ELLIPTA 200-25 MCG/INH AEPB INL 1 PUFF PO QD  2  . cetirizine (ZYRTEC) 10 MG tablet Take 5 mg by mouth daily.     . diclofenac (VOLTAREN)  75 MG EC tablet TAKE 1 TABLET(75 MG) BY MOUTH TWICE DAILY WITH FOOD 60 tablet 0  . Epinastine HCl 0.05 % ophthalmic solution Place 1 drop into both eyes 2 (two) times daily. 5 mL 2  . ferrous sulfate 325 (65 FE) MG tablet Take 325 mg by mouth every other day.     . levocetirizine (XYZAL) 5 MG tablet Take 1 tablet by mouth every evening.   9  . losartan (COZAAR) 25 MG tablet TAKE 1 TABLET(25 MG) BY MOUTH DAILY 90 tablet 0  . MAGNESIUM PO Take by mouth.    . metFORMIN (GLUCOPHAGE) 1000 MG tablet  TAKE 1 TABLET(1000 MG) BY MOUTH TWICE DAILY WITH A MEAL 180 tablet 3  . montelukast (SINGULAIR) 10 MG tablet Take 1 tablet (10 mg total) by mouth at bedtime. 90 tablet 3  . Multiple Vitamins-Minerals (CENTRUM ADULTS PO) Take by mouth.    . predniSONE (DELTASONE) 20 MG tablet Take 3 tabs qd x 3d, then 2 tabs qd x 3d then 1 tab qd x 3d. 18 tablet 0  . traMADol (ULTRAM) 50 MG tablet TAKE 1 TABLET BY MOUTH EVERY 8 HOURS AS NEEDED 30 tablet 0  . traZODone (DESYREL) 50 MG tablet Take 1 tablet (50 mg total) by mouth at bedtime. 90 tablet 1  . triamcinolone (NASACORT) 55 MCG/ACT AERO nasal inhaler Place 2 sprays into the nose daily. 1 Inhaler 12  . venlafaxine XR (EFFEXOR-XR) 150 MG 24 hr capsule Take 1 capsule (150 mg total) by mouth daily with breakfast. 90 capsule 1  . vitamin C (ASCORBIC ACID) 500 MG tablet Take 500 mg by mouth daily.     No current facility-administered medications on file prior to visit.     Allergies  Allergen Reactions  . Adhesive [Tape] Rash and Other (See Comments)    Peeling of skin  . Prednisone     Hyperglycemia with oral steroids    Past Medical History:  Diagnosis Date  . Anemia   . Anxiety   . Asthma   . Diabetes mellitus type II   . Hyperlipidemia   . Hypertension   . NSVD (normal spontaneous vaginal delivery)    x 2   Social History   Social History Narrative   2 Sons   Works at [employer]   Married   Social History   Tobacco Use  . Smoking status: Never Smoker  . Smokeless tobacco: Never Used  Substance Use Topics  . Alcohol use: Yes    Alcohol/week: 0.6 - 1.2 oz    Types: 1 - 2 Standard drinks or equivalent per week    Comment: Occasional beer  . Drug use: No   family history includes Breast cancer (age of onset: 50) in her mother; Cancer in her mother; Diabetes in her maternal grandmother.     Objective:  BP 120/70   Pulse (!) 116   Temp 99.5 F (37.5 C) (Oral)   Resp 18   Ht 5\' 11"  (1.803 m)   Wt 210 lb 9.6 oz (95.5  kg)   SpO2 96%   BMI 29.37 kg/m  Body mass index is 29.37 kg/m.  Wt Readings from Last 3 Encounters:  01/02/18 210 lb 9.6 oz (95.5 kg)  12/31/17 213 lb 9.6 oz (96.9 kg)  12/26/17 215 lb (97.5 kg)    Physical Exam  Constitutional: She is oriented to person, place, and time. She appears well-developed and well-nourished.  HENT:  Head: Normocephalic and atraumatic.  Mouth/Throat: Oropharynx is  clear and moist.  hoarse  Eyes: Pupils are equal, round, and reactive to light. Conjunctivae are normal.  Neck: Normal range of motion. Neck supple.  Cardiovascular: Normal rate, regular rhythm and normal heart sounds. Exam reveals no gallop and no friction rub.  No murmur heard. Pulmonary/Chest: Breath sounds normal. No respiratory distress. She has no wheezes. She has no rales. She exhibits tenderness (mild tenderness along the edges of the sternum).  Talking in full sentences, cough seems to be coming from throat   Lymphadenopathy:    She has cervical adenopathy (supraclavicular).  Neurological: She is alert and oriented to person, place, and time.  Skin: Skin is warm.  Slightly diaphoretic  Psychiatric: She has a normal mood and affect. Her behavior is normal. Judgment and thought content normal.   Rhythm: sinus rhythm at a rate of 100. Findings: NSR without acute change Last EKG: 05/10/2017 Changes from last EKG: No I have personally reviewed the EKG tracing and agree with the computerized printout.   Assessment and Plan :  1. Chest tightness 2. Moderate persistent asthma with acute exacerbation Patient seems to describe tightness as respiratory and cough related. It is mildly reproducible on physical exam. She denies cardiac related pain with a benign EKG and does not currently exhibit additional risk factors to warrant D-dimer. Addition of ipratropium solution seemed to bring mild relief more so than albuterol alone. Will prescribe Atrovent inhaler to help continual alleviation.  Patient also currently on prednisone taper that she will continue.  After patient left did consider the possible of heartburn from prednisone causing her symptoms and we will call patient and ask directed questions and then get her to get some OTC antiacids to help.  Also wonder the role of no allergy injections in the last 2 weeks due to feeling poorly. Pt pulse improves after neb though the patient feels shakey as expected from the neb - albuterol (PROVENTIL) (2.5 MG/3ML) 0.083% nebulizer solution 2.5 mg - ipratropium (ATROVENT) nebulizer solution 0.5 mg - EKG 12-Lead - Ipratropium-Albuterol (COMBIVENT RESPIMAT) 20-100 MCG/ACT AERS respimat; Inhale 1 puff into the lungs every 6 (six) hours. Do not use with you ventolin  Dispense: 1 Inhaler; Refill: 0  3. Acute recurrent ethmoidal sinusitis Patient is still taking augmentin she received 12/31/17 and will continue to completion. ? PND being part of the cough and then since she has been coughing so much she is having some mild costochondritis as she is slightly tender on exam  4. Cough Suspect that restlessness that she had last night could be exacerbating cough and respiratory symptoms. - chlorpheniramine-HYDROcodone (TUSSIONEX PENNKINETIC ER) 10-8 MG/5ML SUER; Take 5 mLs by mouth 2 (two) times daily.  Dispense: 100 mL; Refill: 0   Patient verbalized to me that they understand the following: diagnosis, what is being done for them, what to expect and what should be done at home.  Their questions have been answered.  See after visit summary for patient specific instructions.  D/w Dr Pamella Pert - pt was also evaluated by her  Windell Hummingbird PA-C  Primary Care at [place] 01/02/2018 3:23 PM  Please note: Portions of this report may have been transcribed using dragon voice recognition software. Every effort was made to ensure accuracy; however, inadvertent computerized transcription errors may be present.

## 2018-01-02 NOTE — Telephone Encounter (Signed)
Patient needs disability forms completed by Dr Brigitte Pulse for her asthma and chest tightness that she has been having. I have completed the forms and will place them in Dr Raul Del box on 01/02/18 to be signed. Please return to the FMLA/Disability box at the [employer] checkout desk within 5-7 business days. Thank you!

## 2018-01-02 NOTE — Patient Instructions (Signed)
     IF you received an x-ray today, you will receive an invoice from [facility]. Please contact [facility] at [phone] with questions or concerns regarding your invoice.   IF you received labwork today, you will receive an invoice from [facility]. Please contact [facility] at [phone] with questions or concerns regarding your invoice.   Our billing staff will not be able to assist you with questions regarding bills from these companies.  You will be contacted with the lab results as soon as they are available. The fastest way to get your results is to activate your My Chart account. Instructions are located on the last page of this paperwork. If you have not heard from us regarding the results in 2 weeks, please contact this office.     

## 2018-01-02 NOTE — Telephone Encounter (Signed)
Pt seen in office on 6/24 and was treated for chest tightness and some SOB. Pt was prescribed Amoxicillin and prednisone to treat symptoms. Pt reports that she has been taking Ventolin 3 times a day along with prescribed medication and does not feel like she had any improvement. Pt also stating that her voice was clear yesterday but now it is high pitched and feels like she is having bronchial spasms.Pt states she was supposed to return to work but is not feeling well enough to return to work. PCP not available for appt today. Pt scheduled for appt with  with Smith Robert today. Pt advised if symptoms worsen before seen for appt to seek treatment in the ED. Pt verbalized understanding.

## 2018-01-03 ENCOUNTER — Encounter: Payer: Self-pay | Admitting: Physician Assistant

## 2018-01-03 ENCOUNTER — Telehealth: Payer: Self-pay | Admitting: Physician Assistant

## 2018-01-03 NOTE — Telephone Encounter (Signed)
Please call patient and check on her - let her know that Dr Pamella Pert and I were thinking and the other thing that we though about was the possible heartburn that could have come from the steroids - if she is having ay symptoms she should try some maalox or other liquid antacid.

## 2018-01-03 NOTE — Telephone Encounter (Signed)
LMOVM for pt to return call to clinic and ask for Lucindy Borel Have suggestions from message from Judson Roch for her.

## 2018-01-03 NOTE — Telephone Encounter (Signed)
Copied from [place] [id number]. Topic: General - Other >> Jan 03, 2018  9:38 AM Yvette Rack wrote: Reason for CRM: Pt returned call to office. Pt requests a call back. Cb# [phone]

## 2018-01-03 NOTE — Telephone Encounter (Signed)
So glad to hear she is feeling better.

## 2018-01-03 NOTE — Telephone Encounter (Signed)
Called pt.  She states she is feeling better, breathing better.  States she blew a lot and coughed a lot of product up this am and is feeling better.   Gave her the message of trying Maalox or another antiacid.  She verbalized understanding.   Advised her to call the office with any increased symptoms.

## 2018-01-03 NOTE — Telephone Encounter (Signed)
Pt called back stating that she isn't having any symptoms of acid reflux or anything else associated with heart burn

## 2018-01-04 ENCOUNTER — Encounter: Payer: Self-pay | Admitting: Family Medicine

## 2018-01-05 ENCOUNTER — Other Ambulatory Visit: Payer: Self-pay

## 2018-01-05 ENCOUNTER — Encounter: Payer: Self-pay | Admitting: Family Medicine

## 2018-01-05 ENCOUNTER — Ambulatory Visit: Payer: PRIVATE HEALTH INSURANCE | Admitting: Family Medicine

## 2018-01-05 VITALS — BP 137/84 | HR 100 | Temp 97.6°F | Resp 16 | Ht 71.0 in | Wt 212.0 lb

## 2018-01-05 DIAGNOSIS — J0121 Acute recurrent ethmoidal sinusitis: Secondary | ICD-10-CM | POA: Diagnosis not present

## 2018-01-05 DIAGNOSIS — J4541 Moderate persistent asthma with (acute) exacerbation: Secondary | ICD-10-CM | POA: Diagnosis not present

## 2018-01-05 MED ORDER — LEVOFLOXACIN 500 MG PO TABS: 500.0000 mg | ORAL_TABLET | Freq: Every day | ORAL | 0 refills | Status: DC

## 2018-01-05 NOTE — Progress Notes (Signed)
Subjective:  By signing my name below, I, Kelly Reed, attest that this documentation has been prepared under the direction and in the presence of [name], MD Electronically Signed: [name], ED Scribe 01/05/2018 at 9:45 AM.   Patient ID: Kelly Reed, female    DOB: [birth year], 52 y.o.   MRN: [id number]  Chief Complaint  Patient presents with  . Sinusitis    still have sinus pain/pressure, a little chest congestion, no fever since yesterday, yellow mucus   HPI Kelly Reed is a 52 y.o. female who presents to Primary Care at [facility] complaining of gradually improving sinus pain/pressure x 2 wks. Pt reports that she is still having some chest congestion, mildly productive cough with yellow sputum and yellow-colored mucus. Denies chills, fever since yesterday. Pt was prescribed combivent respimat inhaler 3 days ago by Windell Hummingbird, PA-C which she has been using every 4 hrs with improvement. She is half way through her Prednisone coarse, has tried Mucinex and a nasal rinse. Pt has not had allergy shots in ~2 wks.  Hot Flashes Pt reports intermittent hot flashes over the past yr. Pr is no longer having menstrual periods. She drinks ~ 1 glass of soy milk/day with cereal.  Past Medical History:  Diagnosis Date  . Anemia   . Anxiety   . Asthma   . Diabetes mellitus type II   . Hyperlipidemia   . Hypertension   . NSVD (normal spontaneous vaginal delivery)    x 2   Past Surgical History:  Procedure Laterality Date  . 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 History of Abd. ultrasound  12/04   negative  . Surg. eval lap cholecstectomy  05/28/2006   deferred by patient  . TUBAL LIGATION  1993   bilateral   Current Outpatient Medications on File Prior to Visit  Medication Sig Dispense Refill  . albuterol (VENTOLIN HFA) 108 (90 Base) MCG/ACT inhaler INHALE 2 PUFFS BY MOUTH INTO THE LUNGS EVERY 6 HOURS AS NEEDED WHEEZING OR SHORTNESS OF BREATH 18 g 5  .  amoxicillin-clavulanate (AUGMENTIN) 875-125 MG tablet Take 1 tablet by mouth 2 (two) times daily. 20 tablet 0  . aspirin EC 81 MG tablet Take 1 tablet (81 mg total) by mouth daily.    Marland Kitchen atorvastatin (LIPITOR) 10 MG tablet TAKE 1 TABLET(10 MG) BY MOUTH DAILY 90 tablet 1  . Azelastine-Fluticasone (DYMISTA) 137-50 MCG/ACT SUSP Place into the nose.    Marland Kitchen BREO ELLIPTA 200-25 MCG/INH AEPB INL 1 PUFF PO QD  2  . cetirizine (ZYRTEC) 10 MG tablet Take 5 mg by mouth daily.     . chlorpheniramine-HYDROcodone (TUSSIONEX PENNKINETIC ER) 10-8 MG/5ML SUER Take 5 mLs by mouth 2 (two) times daily. 100 mL 0  . diclofenac (VOLTAREN) 75 MG EC tablet TAKE 1 TABLET(75 MG) BY MOUTH TWICE DAILY WITH FOOD 60 tablet 0  . Epinastine HCl 0.05 % ophthalmic solution Place 1 drop into both eyes 2 (two) times daily. 5 mL 2  . ferrous sulfate 325 (65 FE) MG tablet Take 325 mg by mouth every other day.     . Ipratropium-Albuterol (COMBIVENT RESPIMAT) 20-100 MCG/ACT AERS respimat Inhale 1 puff into the lungs every 6 (six) hours. Do not use with you ventolin 1 Inhaler 0  . levocetirizine (XYZAL) 5 MG tablet Take 1 tablet by mouth every evening.   9  . losartan (COZAAR) 25 MG tablet TAKE 1 TABLET(25 MG) BY MOUTH DAILY 90 tablet 0  .  MAGNESIUM PO Take by mouth.    . metFORMIN (GLUCOPHAGE) 1000 MG tablet TAKE 1 TABLET(1000 MG) BY MOUTH TWICE DAILY WITH A MEAL 180 tablet 3  . montelukast (SINGULAIR) 10 MG tablet Take 1 tablet (10 mg total) by mouth at bedtime. 90 tablet 3  . Multiple Vitamins-Minerals (CENTRUM ADULTS PO) Take by mouth.    . predniSONE (DELTASONE) 20 MG tablet Take 3 tabs qd x 3d, then 2 tabs qd x 3d then 1 tab qd x 3d. 18 tablet 0  . traMADol (ULTRAM) 50 MG tablet TAKE 1 TABLET BY MOUTH EVERY 8 HOURS AS NEEDED 30 tablet 0  . traZODone (DESYREL) 50 MG tablet Take 1 tablet (50 mg total) by mouth at bedtime. 90 tablet 1  . triamcinolone (NASACORT) 55 MCG/ACT AERO nasal inhaler Place 2 sprays into the nose daily. 1 Inhaler  12  . venlafaxine XR (EFFEXOR-XR) 150 MG 24 hr capsule Take 1 capsule (150 mg total) by mouth daily with breakfast. 90 capsule 1  . vitamin C (ASCORBIC ACID) 500 MG tablet Take 500 mg by mouth daily.     No current facility-administered medications on file prior to visit.    Allergies  Allergen Reactions  . Adhesive [Tape] Rash and Other (See Comments)    Peeling of skin  . Prednisone     Hyperglycemia with oral steroids   Family History  Problem Relation Age of Onset  . Breast cancer Mother 54       pt's mother passed away from breast cancer when pt was 53 yo  . Diabetes Maternal Grandmother    Social History   Socioeconomic History  . Marital status: Married    Spouse name: Not on file  . Number of children: 2  . Years of education: Not on file  . Highest education level: Not on file  Occupational History  . Occupation: Retail buyer.    Employer: [employer]  Social Needs  . Financial resource strain: Not on file  . Food insecurity:    Worry: Not on file    Inability: Not on file  . Transportation needs:    Medical: Not on file    Non-medical: Not on file  Tobacco Use  . Smoking status: Never Smoker  . Smokeless tobacco: Never Used  Substance and Sexual Activity  . Alcohol use: Yes    Alcohol/week: 0.6 - 1.2 oz    Types: 1 - 2 Standard drinks or equivalent per week    Comment: Occasional beer  . Drug use: No  . Sexual activity: Never    Partners: Male    Birth control/protection: Surgical  Lifestyle  . Physical activity:    Days per week: Not on file    Minutes per session: Not on file  . Stress: Not on file  Relationships  . Social connections:    Talks on phone: Not on file    Gets together: Not on file    Attends religious service: Not on file    Active member of club or organization: Not on file    Attends meetings of clubs or organizations: Not on file    Relationship status: Not on file  Other Topics Concern  . Not on file  Social History  Narrative   2 Sons   Works at [employer]   Married   Depression screen [facility] 2/9 01/05/2018 01/02/2018 12/31/2017 12/26/2017 09/14/2017  Decreased Interest 0 0 0 0 0  Down, Depressed, Hopeless 0 0 0 0 0  PHQ - 2 Score 0 0 0 0 0   Review of Systems  Constitutional: Negative for chills. Fever: resolved.  HENT: Positive for rhinorrhea, sinus pressure and sinus pain.   Respiratory: Positive for cough.       Objective:   Physical Exam  Constitutional: She is oriented to person, place, and time. She appears well-developed and well-nourished. No distress.  HENT:  Head: Normocephalic and atraumatic.  Right Ear: A middle ear effusion (mild) is present.  Left Ear: A middle ear effusion (mild) is present.  Nose: Nose normal.  Mouth/Throat: Oropharynx is clear and moist and mucous membranes are normal.  Eyes: Conjunctivae and EOM are normal.  Neck: Neck supple. No tracheal deviation present.  Cardiovascular: Regular rhythm. Tachycardia present.  Pulmonary/Chest: Effort normal and breath sounds normal. No respiratory distress.  Musculoskeletal: Normal range of motion.  Neurological: She is alert and oriented to person, place, and time.  Skin: Skin is warm. She is diaphoretic.  Psychiatric: She has a normal mood and affect. Her behavior is normal.  Nursing note and vitals reviewed.  BP 137/84   Pulse 100   Temp 97.6 F (36.4 C)   Resp 16   Ht 5\' 11"  (1.803 m)   Wt 212 lb (96.2 kg)   SpO2 96%   BMI 29.57 kg/m     Assessment & Plan:   1. Moderate persistent asthma with acute exacerbation   2. Acute recurrent ethmoidal sinusitis    Has been protracted course with 4th visit for sxs with first 6/19 after sxs started ~6/16 - has failed zpack and Depo-Medrol 120 at first visit 6/19, seen 6/24 and put on augmentin x 10d and 9d 60mg  prednisone taper, seen 6/26 - continue course and rx'd combivent and tussionex, and today.  Meds ordered this encounter  Medications  . levofloxacin  (LEVAQUIN) 500 MG tablet    Sig: Take 1 tablet (500 mg total) by mouth daily.    Dispense:  7 tablet    Refill:  0    I personally performed the services described in this documentation, which was scribed in my presence. The recorded information has been reviewed and considered, and addended by me as needed.   [name], M.D.  Primary Care at [facility] [address] [place], [place] [address] [phone] phone [phone] fax  01/12/18 10:03 AM

## 2018-01-05 NOTE — Patient Instructions (Addendum)
IF you received an x-ray today, you will receive an invoice from [facility]. Please contact [facility] at [phone] with questions or concerns regarding your invoice.   IF you received labwork today, you will receive an invoice from [facility]. Please contact [facility] at [phone] with questions or concerns regarding your invoice.   Our billing staff will not be able to assist you with questions regarding bills from these companies.  You will be contacted with the lab results as soon as they are available. The fastest way to get your results is to activate your My Chart account. Instructions are located on the last page of this paperwork. If you have not heard from [place] regarding the results in 2 weeks, please contact this office.     Menopause and Herbal Products What is menopause? Menopause is the normal time of life when menstrual periods decrease in frequency and eventually stop completely. This process can take several years for some women. Menopause is complete when you have had an absence of menstruation for a full year since your last menstrual period. It usually occurs between the ages of 57 and 8. It is not common for menopause to begin before the age of 35. During menopause, your body stops producing the female hormones estrogen and progesterone. Common symptoms associated with this loss of hormones (vasomotor symptoms) are:  Hot flashes.  Hot flushes.  Night sweats.  Other common symptoms and complications of menopause include:  Decrease in sex drive.  Vaginal dryness and thinning of the walls of the vagina. This can make sex painful.  Dryness of the skin and development of wrinkles.  Headaches.  Tiredness.  Irritability.  Memory problems.  Weight gain.  Bladder infections.  Hair growth on the face and chest.  Inability to reproduce offspring (infertility).  Loss of density in the bones (osteoporosis) increasing your risk for  breaks (fractures).  Depression.  Hardening and narrowing of the arteries (atherosclerosis). This increases your risk of heart attack and stroke.  What treatment options are available? There are many treatment choices for menopause symptoms. The most common treatment is hormone replacement therapy. Many alternative therapies for menopause are emerging, including the use of herbal products. These supplements can be found in the form of herbs, teas, oils, tinctures, and pills. Common herbal supplements for menopause are made from plants that contain phytoestrogens. Phytoestrogens are compounds that occur naturally in plants and plant products. They act like estrogen in the body. Foods and herbs that contain phytoestrogens include:  Soy.  Flax seeds.  Red clover.  Ginseng.  What menopause symptoms may be helped if I use herbal products?  Vasomotor symptoms. These may be helped by: ? Soy. Some studies show that soy may have a moderate benefit for hot flashes. ? Black cohosh. There is limited evidence indicating this may be beneficial for hot flashes.  Symptoms that are related to heart and blood vessel disease. These may be helped by soy. Studies have shown that soy can help to lower cholesterol.  Depression. This may be helped by: ? St. John's wort. There is limited evidence that shows this may help mild to moderate depression. ? Black cohosh. There is evidence that this may help depression and mood swings.  Osteoporosis. Soy may help to decrease bone loss that is associated with menopause and may prevent osteoporosis. Limited evidence indicates that red clover may offer some bone loss protection as well. Other herbal products that are commonly used during menopause lack enough  evidence to support their use as a replacement for conventional menopause therapies. These products include evening primrose, ginseng, and red clover. What are the cases when herbal products should not be used  during menopause? Do not use herbal products during menopause without your health care provider's approval if:  You are taking medicine.  You have a preexisting liver condition.  Are there any risks in my taking herbal products during menopause? If you choose to use herbal products to help with symptoms of menopause, keep in mind that:  Different supplements have different and unmeasured amounts of herbal ingredients.  Herbal products are not regulated the same way that medicines are.  Concentrations of herbs may vary depending on the way they are prepared. For example, the concentration may be different in a pill, tea, oil, and tincture.  Little is known about the risks of using herbal products, particularly the risks of long-term use.  Some herbal supplements can be harmful when combined with certain medicines.  Most commonly reported side effects of herbal products are mild. However, if used improperly, many herbal supplements can cause serious problems. Talk to your health care provider before starting any herbal product. If problems develop, stop taking the supplement and let your health care provider know. This information is not intended to replace advice given to you by your health care provider. Make sure you discuss any questions you have with your health care provider. Document Released: 12/13/2007 Document Revised: 05/23/2016 Document Reviewed: 12/09/2013 Elsevier Interactive Patient Education  2017 [employer].

## 2018-01-08 DIAGNOSIS — Z0271 Encounter for disability determination: Secondary | ICD-10-CM

## 2018-01-09 NOTE — Telephone Encounter (Signed)
Have we completed these forms? Today is the 5th business day

## 2018-01-12 NOTE — Telephone Encounter (Signed)
Forms completed and will be returned to [facility] box at 102 on Sat 7/6

## 2018-01-14 NOTE — Telephone Encounter (Signed)
Paperwork scanned and faxed on 01/14/18

## 2018-01-16 ENCOUNTER — Telehealth: Payer: Self-pay | Admitting: Family Medicine

## 2018-01-16 NOTE — Telephone Encounter (Signed)
Copied from [phone]. Topic: General - Other >> Jan 16, 2018 11:00 AM Cecelia Byars, NT wrote: Reason for CRM: Patient called I regards to [facility] paper work that was faxed on Monday there were some parts left blank  ,part B questions 7 dealing with the flare ups and the duration 8 hrs  /frequency  needs to be Mon, Tues ,Wed ,Thurs or Friday   the insurance [place] said  they will refax but also that she can call and have the pcp fill out  the part that was left blank and initial and re fax it, please call the patient once this is done at [phone]

## 2018-01-18 NOTE — Telephone Encounter (Signed)
Please see note below. 

## 2018-01-26 LAB — HM DIABETES EYE EXAM

## 2018-01-28 NOTE — Telephone Encounter (Signed)
Pt needs to have all back regarding date on flma.  Pt states that the July 3rd date is wrong and it needs to be December.  They can used the original paperwork, but the July 3 date needs to be marked out and the new December date needs to be written in and refaxed.  It needs to be marked through, correct date, initialed and dated.  cb is [phone]. Please call with questions

## 2018-01-28 NOTE — Telephone Encounter (Signed)
Paperwork was filled out correctly with the correct dates on it per the provider she has not been cleared to miss work or have any accomodation through 06/2018,  As for her last OV on 01/05/2018 patient was cleared to return to work with no restrictions as of 01/10/2018. If the patient has additional issues that need to be addressed then she will need to come back in to be seen but the provider sent in updated info on 01/21/2018 stating patient is cleared for work as of 01/10/2018.   So we can not change the date on any paperwork

## 2018-02-15 ENCOUNTER — Other Ambulatory Visit: Payer: Self-pay | Admitting: Family Medicine

## 2018-02-15 NOTE — Telephone Encounter (Signed)
Losartan refill Last Refill:11/05/17 # 90 Last OV: 01/05/18 PCP: [name] Pharmacy:[facility]  Med refill per protocol

## 2018-02-19 ENCOUNTER — Ambulatory Visit: Payer: Self-pay | Admitting: Registered Nurse

## 2018-02-19 VITALS — BP 127/88 | HR 92 | Temp 97.9°F

## 2018-02-19 DIAGNOSIS — J069 Acute upper respiratory infection, unspecified: Secondary | ICD-10-CM

## 2018-02-19 DIAGNOSIS — B9789 Other viral agents as the cause of diseases classified elsewhere: Principal | ICD-10-CM

## 2018-02-19 MED ORDER — ACETAMINOPHEN 500 MG PO TABS
1000.0000 mg | ORAL_TABLET | Freq: Four times a day (QID) | ORAL | 0 refills | Status: AC | PRN
Start: 2018-02-19 — End: 2018-02-22

## 2018-02-19 MED ORDER — SALINE SPRAY 0.65 % NA SOLN: 2.0000 | NASAL | 0 refills | Status: DC

## 2018-02-19 MED ORDER — PREDNISONE 10 MG PO TABS: 60.0000 mg | ORAL_TABLET | Freq: Every day | ORAL | 0 refills | Status: AC

## 2018-02-19 NOTE — Patient Instructions (Signed)
Nonallergic Rhinitis  1. Nonallergic rhinitis is a term used by allergist to describe inflammation in the nose that is not due to an allergic source (i.e., pollens, mold, animal dander or dust mites). 2. Nonallergic rhinitis can mimic many of the symptoms caused by allergies.  This includes a runny nose ("rhinorrhea"), sneezing, congestion, ear fullness and post nasal drip. 3. These symptoms usually occur year round but can be made worse by many environmental factors including weather change, irritants such as cigarette smoke, fumes, perfumes, detergents and many others. These are not allergens, but are irritants, and do not cause the formation of antibodies like true allergens.  For this reason most people with nonallergic rhinitis have negative skin tests. 4. Unfortunately, we have a poor understanding of what causes nonallergic rhinitis but certain factors such as deviated septum, sinusitis and nasal polyps may contribute to the symptoms.  Due to the poor understanding of what causes nonallergic rhinitis it cannot be cured and our treatment is only symptomatic to control symptoms. 5. Important factors in the successful treatment of nonallergic rhinitis include avoidance of the irritants that cause symptoms; for example, people who continue to smoke may never improve.  Continuous therapy works better than intermittent.  This may mean taking medications once daily or 3-4 times a day. Helpful treatments include nasal saline lavage, nasal ipratropium (Atrovent), nasal steroids, and decongestants.  Pure antihistamines don't seem to work very well.  Immunotherapy (allergy shots) has no role in the treatment of nonallergic rhinitis.  Several medications may have to be tried before a good combination is found for you.  These medications, in general, are safe for long term use.  Tolerance may develop to the therapeutic effects of these medications: Frequently changing between several helpful medications can prevent  this should it occur.Pharyngitis Pharyngitis is redness, pain, and swelling (inflammation) of the throat (pharynx). It is a very common cause of sore throat. Pharyngitis can be caused by a bacteria, but it is usually caused by a virus. Most cases of pharyngitis get better on their own without treatment. What are the causes? This condition may be caused by:  Infection by viruses (viral). Viral pharyngitis spreads from person to person (is contagious) through coughing, sneezing, and sharing of personal items or utensils such as cups, forks, spoons, and toothbrushes.  Infection by bacteria (bacterial). Bacterial pharyngitis may be spread by touching the nose or face after coming in contact with the bacteria, or through more intimate contact, such as kissing.  Allergies. Allergies can cause buildup of mucus in the throat (post-nasal drip), leading to inflammation and irritation. Allergies can also cause blocked nasal passages, forcing breathing through the mouth, which dries and irritates the throat.  What increases the risk? You are more likely to develop this condition if:  You are 42-58 years old.  You are exposed to crowded environments such as daycare, school, or dormitory living.  You live in a cold climate.  You have a weakened disease-fighting (immune) system.  What are the signs or symptoms? Symptoms of this condition vary by the cause (viral, bacterial, or allergies) and can include:  Sore throat.  Fatigue.  Low-grade fever.  Headache.  Joint pain and muscle aches.  Skin rashes.  Swollen glands in the throat (lymph nodes).  Plaque-like film on the throat or tonsils. This is often a symptom of bacterial pharyngitis.  Vomiting.  Stuffy nose (nasal congestion).  Cough.  Red, itchy eyes (conjunctivitis).  Loss of appetite.  How is this diagnosed? This  condition is often diagnosed based on your medical history and a physical exam. Your health care provider will ask  you questions about your illness and your symptoms. A swab of your throat may be done to check for bacteria (rapid strep test). Other lab tests may also be done, depending on the suspected cause, but these are rare. How is this treated? This condition usually gets better in 3-4 days without medicine. Bacterial pharyngitis may be treated with antibiotic medicines. Follow these instructions at home:  Take over-the-counter and prescription medicines only as told by your health care provider. ? If you were prescribed an antibiotic medicine, take it as told by your health care provider. Do not stop taking the antibiotic even if you start to feel better. ? Do not give children aspirin because of the association with Reye syndrome.  Drink enough water and fluids to keep your urine clear or pale yellow.  Get a lot of rest.  Gargle with a salt-water mixture 3-4 times a day or as needed. To make a salt-water mixture, completely dissolve -1 tsp of salt in 1 cup of warm water.  If your health care provider approves, you may use throat lozenges or sprays to soothe your throat. Contact a health care provider if:  You have large, tender lumps in your neck.  You have a rash.  You cough up green, yellow-brown, or bloody spit. Get help right away if:  Your neck becomes stiff.  You drool or are unable to swallow liquids.  You cannot drink or take medicines without vomiting.  You have severe pain that does not go away, even after you take medicine.  You have trouble breathing, and it is not caused by a stuffy nose.  You have new pain and swelling in your joints such as the knees, ankles, wrists, or elbows. Summary  Pharyngitis is redness, pain, and swelling (inflammation) of the throat (pharynx).  While pharyngitis can be caused by a bacteria, the most common causes are viral.  Most cases of pharyngitis get better on their own without treatment.  Bacterial pharyngitis is treated with  antibiotic medicines. This information is not intended to replace advice given to you by your health care provider. Make sure you discuss any questions you have with your health care provider. Document Released: 06/26/2005 Document Revised: 08/01/2016 Document Reviewed: 08/01/2016 Elsevier Interactive Patient Education  2018 [place] Prevention, Adult Although you may not be able to control the fact that you have asthma, you can take actions to prevent episodes of asthma (asthma attacks). These actions include:  Creating a written plan for managing and treating your asthma attacks (asthma action plan).  Monitoring your asthma.  Avoiding things that can irritate your airways or make your asthma symptoms worse (asthma triggers).  Taking your medicines as directed.  Acting quickly if you have signs or symptoms of an asthma attack.  What are some ways to prevent an asthma attack? Create a plan Work with your health care provider to create an asthma action plan. This plan should include:  A list of your asthma triggers and how to avoid them.  A list of symptoms that you experience during an asthma attack.  Information about when to take medicine and how much medicine to take.  Information to help you understand your peak flow measurements.  Contact information for your health care providers.  Daily actions that you can take to control asthma.  Monitor your asthma  To monitor your asthma:  Use your peak flow meter every morning and every evening for 2-3 weeks. Record the results in a journal. A drop in your peak flow numbers on one or more days may mean that you are starting to have an asthma attack, even if you are not having symptoms.  When you have asthma symptoms, write them down in a journal.  Avoid asthma triggers  Work with your health care provider to find out what your asthma triggers are. This can be done by:  Being tested for allergies.  Keeping  a journal that notes when asthma attacks occur and what may have contributed to them.  Asking your health care provider whether other medical conditions make your asthma worse.  Common asthma triggers include:  Dust.  Smoke. This includes campfire smoke and secondhand smoke from tobacco products.  Pet dander.  Trees, grasses or pollens.  Very cold, dry, or humid air.  Mold.  Foods that contain high amounts of sulfites.  Strong smells.  Engine exhaust and air pollution.  Aerosol sprays and fumes from household cleaners.  Household pests and their droppings, including dust mites and cockroaches.  Certain medicines, including NSAIDs.  Once you have determined your asthma triggers, take steps to avoid them. Depending on your triggers, you may be able to reduce the chance of an asthma attack by:  Keeping your home clean. Have someone dust and vacuum your home for you 1 or 2 times a week. If possible, have them use a high-efficiency particulate arrestance (HEPA) vacuum.  Washing your sheets weekly in hot water.  Using allergy-proof mattress covers and casings on your bed.  Keeping pets out of your home.  Taking care of mold and water problems in your home.  Avoiding areas where people smoke.  Avoiding using strong perfumes or odor sprays.  Avoid spending a lot of time outdoors when pollen counts are high and on very windy days.  Talking with your health care provider before stopping or starting any new medicines.  Medicines Take over-the-counter and prescription medicines only as told by your health care provider. Many asthma attacks can be prevented by carefully following your medicine schedule. Taking your medicines correctly is especially important when you cannot avoid certain asthma triggers. Even if you are doing well, do not stop taking your medicine and do not take less medicine. Act quickly If an asthma attack happens, acting quickly can decrease how severe it  is and how long it lasts. Take these actions:  Pay attention to your symptoms. If you are coughing, wheezing, or having difficulty breathing, do not wait to see if your symptoms go away on their own. Follow your asthma action plan.  If you have followed your asthma action plan and your symptoms are not improving, call your health care provider or seek immediate medical care at the nearest hospital.  It is important to write down how often you need to use your fast-acting rescue inhaler. You can track how often you use an inhaler in your journal. If you are using your rescue inhaler more often, it may mean that your asthma is not under control. Adjusting your asthma treatment plan may help you to prevent future asthma attacks and help you to gain better control of your condition. How can I prevent an asthma attack when I exercise?  Exercise is a common asthma trigger. To prevent asthma attacks during exercise:  Follow advice from your health care provider about whether you should use your fast-acting inhaler before exercising. Many  people with asthma experience exercise-induced bronchoconstriction (EIB). This condition often worsens during vigorous exercise in cold, humid, or dry environments. Usually, people with EIB can stay very active by using a fast-acting inhaler before exercising.  Avoid exercising outdoors in very cold or humid weather.  Avoid exercising outdoors when pollen counts are high.  Warm up and cool down when exercising.  Stop exercising right away if asthma symptoms start.  Consider taking part in exercises that are less likely to cause asthma symptoms such as:  Indoor swimming.  Biking.  Walking.  Hiking.  Playing football.  This information is not intended to replace advice given to you by your health care provider. Make sure you discuss any questions you have with your health care provider. Document Released: 06/14/2009 Document Revised: 02/25/2016 Document  Reviewed: 12/11/2015 Elsevier Interactive Patient Education  2018 [employer]. Viral Respiratory Infection A respiratory infection is an illness that affects part of the respiratory system, such as the lungs, nose, or throat. Most respiratory infections are caused by either viruses or bacteria. A respiratory infection that is caused by a virus is called a viral respiratory infection. Common types of viral respiratory infections include:  A cold.  The flu (influenza).  A respiratory syncytial virus (RSV) infection.  How do I know if I have a viral respiratory infection? Most viral respiratory infections cause:  A stuffy or runny nose.  Yellow or green nasal discharge.  A cough.  Sneezing.  Fatigue.  Achy muscles.  A sore throat.  Sweating or chills.  A fever.  A headache.  How are viral respiratory infections treated? If influenza is diagnosed early, it may be treated with an antiviral medicine that shortens the length of time a person has symptoms. Symptoms of viral respiratory infections may be treated with over-the-counter and prescription medicines, such as:  Expectorants. These make it easier to cough up mucus.  Decongestant nasal sprays.  Health care providers do not prescribe antibiotic medicines for viral infections. This is because antibiotics are designed to kill bacteria. They have no effect on viruses. How do I know if I should stay home from work or school? To avoid exposing others to your respiratory infection, stay home if you have:  A fever.  A persistent cough.  A sore throat.  A runny nose.  Sneezing.  Muscles aches.  Headaches.  Fatigue.  Weakness.  Chills.  Sweating.  Nausea.  Follow these instructions at home:  Rest as much as possible.  Take over-the-counter and prescription medicines only as told by your health care provider.  Drink enough fluid to keep your urine clear or pale yellow. This helps prevent dehydration  and helps loosen up mucus.  Gargle with a salt-water mixture 3-4 times per day or as needed. To make a salt-water mixture, completely dissolve -1 tsp of salt in 1 cup of warm water.  Use nose drops made from salt water to ease congestion and soften raw skin around your nose.  Do not drink alcohol.  Do not use tobacco products, including cigarettes, chewing tobacco, and e-cigarettes. If you need help quitting, ask your health care provider. Contact a health care provider if:  Your symptoms last for 10 days or longer.  Your symptoms get worse over time.  You have a fever.  You have severe sinus pain in your face or forehead.  The glands in your jaw or neck become very swollen. Get help right away if:  You feel pain or pressure in your chest.  You have shortness of breath.  You faint or feel like you will faint.  You have severe and persistent vomiting.  You feel confused or disoriented. This information is not intended to replace advice given to you by your health care provider. Make sure you discuss any questions you have with your health care provider. Document Released: 04/05/2005 Document Revised: 12/02/2015 Document Reviewed: 12/02/2014 Elsevier Interactive Patient Education  2018 [employer]. How to Use a Metered Dose Inhaler A metered dose inhaler is a handheld device for taking medicine that must be breathed into the lungs (inhaled). The device can be used to deliver a variety of inhaled medicines, including:  Quick relief or rescue medicines, such as bronchodilators.  Controller medicines, such as corticosteroids.  The medicine is delivered by pushing down on a metal canister to release a preset amount of spray and medicine. Each device contains the amount of medicine that is needed for a preset number of uses (inhalations). Your health care provider may recommend that you use a spacer with your inhaler to help you take the medicine more effectively. A spacer is a  plastic tube with a mouthpiece on one end and an opening that connects to the inhaler on the other end. A spacer holds the medicine in a tube for a short time, which allows you to inhale more medicine. What are the risks? If you do not use your inhaler correctly, medicine might not reach your lungs to help you breathe. Inhaler medicine can cause side effects, such as:  Mouth or throat infection.  Cough.  Hoarseness.  Headache.  Nausea and vomiting.  Lung infection (pneumonia) in people who have a lung condition called COPD.  How to use a metered dose inhaler without a spacer 1. Remove the cap from the inhaler. 2. If you are using the inhaler for the first time, shake it for 5 seconds, turn it away from your face, then release 4 puffs into the air. This is called priming. 3. Shake the inhaler for 5 seconds. 4. Position the inhaler so the top of the canister faces up. 5. Put your index finger on the top of the medicine canister. Support the bottom of the inhaler with your thumb. 6. Breathe out normally and as completely as possible, away from the inhaler. 7. Either place the inhaler between your teeth and close your lips tightly around the mouthpiece, or hold the inhaler 1-2 inches (2.5-5 cm) away from your open mouth. Keep your tongue down out of the way. If you are unsure which technique to use, ask your health care provider. 8. Press the canister down with your index finger to release the medicine, then inhale deeply and slowly through your mouth (not your nose) until your lungs are completely filled. Inhaling should take 4-6 seconds. 9. Hold the medicine in your lungs for 5-10 seconds (10 seconds is best). This helps the medicine get into the small airways of your lungs. 10. With your lips in a tight circle (pursed), breathe out slowly. 11. Repeat steps 3-10 until you have taken the number of puffs that your health care provider directed. Wait about 1 minute between puffs or as  directed. 12. Put the cap on the inhaler. 13. If you are using a steroid inhaler, rinse your mouth with water, gargle, and spit out the water. Do not swallow the water. How to use a metered dose inhaler with a spacer 1. Remove the cap from the inhaler. 2. If you are using the inhaler for  the first time, shake it for 5 seconds, turn it away from your face, then release 4 puffs into the air. This is called priming. 3. Shake the inhaler for 5 seconds. 4. Place the open end of the spacer onto the inhaler mouthpiece. 5. Position the inhaler so the top of the canister faces up and the spacer mouthpiece faces you. 6. Put your index finger on the top of the medicine canister. Support the bottom of the inhaler and the spacer with your thumb. 7. Breathe out normally and as completely as possible, away from the spacer. 8. Place the spacer between your teeth and close your lips tightly around it. Keep your tongue down out of the way. 9. Press the canister down with your index finger to release the medicine, then inhale deeply and slowly through your mouth (not your nose) until your lungs are completely filled. Inhaling should take 4-6 seconds. 10. Hold the medicine in your lungs for 5-10 seconds (10 seconds is best). This helps the medicine get into the small airways of your lungs. 11. With your lips in a tight circle (pursed), breathe out slowly. 12. Repeat steps 3-11 until you have taken the number of puffs that your health care provider directed. Wait about 1 minute between puffs or as directed. 13. Remove the spacer from the inhaler and put the cap on the inhaler. 14. If you are using a steroid inhaler, rinse your mouth with water, gargle, and spit out the water. Do not swallow the water. Follow these instructions at home:  Take your inhaled medicine only as told by your health care provider. Do not use the inhaler more than directed by your health care provider.  Keep all follow-up visits as told by  your health care provider. This is important.  If your inhaler has a counter, you can check it to determine how full your inhaler is. If your inhaler does not have a counter, ask your health care provider when you will need to refill your inhaler and write the refill date on a calendar or on your inhaler canister. Note that you cannot know when an inhaler is empty by shaking it.  Follow directions on the package insert for care and cleaning of your inhaler and spacer. Contact a health care provider if:  Symptoms are only partially relieved with your inhaler.  You are having trouble using your inhaler.  You have an increase in phlegm.  You have headaches. Get help right away if:  You feel little or no relief after using your inhaler.  You have dizziness.  You have a fast heart rate.  You have chills or a fever.  You have night sweats.  There is blood in your phlegm. Summary  A metered dose inhaler is a handheld device for taking medicine that must be breathed into the lungs (inhaled).  The medicine is delivered by pushing down on a metal canister to release a preset amount of spray and medicine.  Each device contains the amount of medicine that is needed for a preset number of uses (inhalations). This information is not intended to replace advice given to you by your health care provider. Make sure you discuss any questions you have with your health care provider. Document Released: 06/26/2005 Document Revised: 05/16/2016 Document Reviewed: 05/16/2016 Elsevier Interactive Patient Education  2017 [employer].

## 2018-02-19 NOTE — Progress Notes (Signed)
Subjective:    Patient ID: Kelly Reed, female    DOB: 06-26-1966, 52 y.o.   MRN: [id number]  51y/o Caucasian established female pt c/o sore throat, frontal and maxillary sinus pain/pressure and R otalgia since yesterday. Feels like post nasal drip is causing sore throat. Denied chest congestion. Used OTC Robitussin last night has tussionex liquid at home but couldn't find in middle of night left over from previous illness.   + sick contacts at work and environmental allergies seasonal  Hasn't been using saline nasal, flonase and required 60mg  prednisone x 9 days augmentin and levofloxacin in January 2019 plus inhaled steroids/albuterol  Doesn't feel that bad yet but run down, hot flashes menopause and just plain hot/sweating not flash, fatigue  Plans to go home and rest and hydrate after clinic appt "sick day"  Threw up yesterday after drinking a lot of water tolerated meals later in day and this am     Review of Systems  Constitutional: Positive for diaphoresis and fatigue. Negative for activity change, appetite change, chills, fever and unexpected weight change.  HENT: Positive for congestion, ear pain, postnasal drip, rhinorrhea, sinus pressure, sinus pain and sore throat. Negative for dental problem, drooling, ear discharge, facial swelling, hearing loss, mouth sores, nosebleeds, sneezing, tinnitus, trouble swallowing and voice change.   Eyes: Negative for photophobia, pain, discharge, redness, itching and visual disturbance.  Respiratory: Positive for cough and wheezing. Negative for choking, chest tightness, shortness of breath and stridor.   Cardiovascular: Negative for chest pain, palpitations and leg swelling.  Gastrointestinal: Positive for vomiting. Negative for abdominal distention, abdominal pain, blood in stool, constipation, diarrhea and nausea.  Endocrine: Negative for cold intolerance and heat intolerance.  Genitourinary: Negative for difficulty urinating, dysuria and  hematuria.  Musculoskeletal: Negative for arthralgias, back pain, gait problem, joint swelling, myalgias, neck pain and neck stiffness.  Skin: Negative for color change, pallor, rash and wound.  Allergic/Immunologic: Positive for environmental allergies. Negative for food allergies.  Neurological: Negative for dizziness, tremors, seizures, syncope, facial asymmetry, speech difficulty, weakness, light-headedness, numbness and headaches.  Hematological: Negative for adenopathy. Does not bruise/bleed easily.  Psychiatric/Behavioral: Negative for agitation, behavioral problems, confusion and sleep disturbance.       Objective:   Physical Exam  Constitutional: She is oriented to person, place, and time. Vital signs are normal. She appears well-developed and well-nourished. She is active and cooperative.  Non-toxic appearance. She does not have a sickly appearance. She appears ill. No distress.  HENT:  Head: Normocephalic and atraumatic.  Right Ear: Hearing, external ear and ear canal normal. A middle ear effusion is present.  Left Ear: Hearing, external ear and ear canal normal. A middle ear effusion is present.  Nose: Mucosal edema and rhinorrhea present. No nose lacerations, sinus tenderness, nasal deformity, septal deviation or nasal septal hematoma. No epistaxis.  No foreign bodies. Right sinus exhibits no maxillary sinus tenderness and no frontal sinus tenderness. Left sinus exhibits no maxillary sinus tenderness and no frontal sinus tenderness.  Mouth/Throat: Uvula is midline. Mucous membranes are not pale, dry and not cyanotic. She does not have dentures. No oral lesions. No trismus in the jaw. Normal dentition. No dental abscesses, uvula swelling, lacerations or dental caries. Posterior oropharyngeal edema and posterior oropharyngeal erythema present. No oropharyngeal exudate or tonsillar abscesses. No tonsillar exudate.  Bilateral allergic shiners; cobblestoning posterior pharynx; bilateral  TMs air fluid level clear;   Eyes: Pupils are equal, round, and reactive to light. Conjunctivae, EOM and lids are  normal. Right eye exhibits no chemosis, no discharge, no exudate and no hordeolum. No foreign body present in the right eye. Left eye exhibits no chemosis, no discharge, no exudate and no hordeolum. No foreign body present in the left eye. Right conjunctiva is not injected. Right conjunctiva has no hemorrhage. Left conjunctiva is not injected. Left conjunctiva has no hemorrhage. No scleral icterus. Right eye exhibits normal extraocular motion and no nystagmus. Left eye exhibits normal extraocular motion and no nystagmus. Right pupil is round and reactive. Left pupil is round and reactive. Pupils are equal.  Neck: Trachea normal, normal range of motion and phonation normal. Neck supple. No tracheal tenderness, no spinous process tenderness and no muscular tenderness present. No neck rigidity. No tracheal deviation, no edema, no erythema and normal range of motion present. No thyroid mass and no thyromegaly present.  Cardiovascular: Normal rate, regular rhythm, S1 normal, S2 normal, normal heart sounds and intact distal pulses. PMI is not displaced. Exam reveals no gallop and no friction rub.  No murmur heard. Pulmonary/Chest: Effort normal and breath sounds normal. No accessory muscle usage or stridor. No respiratory distress. She has no decreased breath sounds. She has no wheezes. She has no rhonchi. She has no rales. She exhibits no tenderness.  No cough observed in exam room; spoke full sentences without difficulty  Abdominal: Soft. Normal appearance. She exhibits no distension, no fluid wave and no ascites. There is no rigidity and no guarding.  Musculoskeletal: Normal range of motion. She exhibits no edema or tenderness.       Right shoulder: Normal.       Left shoulder: Normal.       Right elbow: Normal.      Left elbow: Normal.       Right hip: Normal.       Left hip: Normal.        Right knee: Normal.       Left knee: Normal.       Cervical back: Normal.       Thoracic back: Normal.       Lumbar back: Normal.       Right hand: Normal.       Left hand: Normal.  Lymphadenopathy:       Head (right side): No submental, no submandibular, no tonsillar, no preauricular, no posterior auricular and no occipital adenopathy present.       Head (left side): No submental, no submandibular, no tonsillar, no preauricular, no posterior auricular and no occipital adenopathy present.    She has no cervical adenopathy.       Right cervical: No superficial cervical, no deep cervical and no posterior cervical adenopathy present.      Left cervical: No superficial cervical, no deep cervical and no posterior cervical adenopathy present.  Neurological: She is alert and oriented to person, place, and time. She has normal strength. She is not disoriented. She displays no atrophy and no tremor. No cranial nerve deficit or sensory deficit. She exhibits normal muscle tone. She displays no seizure activity. Coordination and gait normal. GCS eye subscore is 4. GCS verbal subscore is 5. GCS motor subscore is 6.  In/out of chair/on/off exam table without difficulty gait sure and steady in hallway  Skin: Skin is warm, dry and intact. Capillary refill takes less than 2 seconds. No abrasion, no bruising, no burn, no ecchymosis, no laceration, no lesion, no petechiae and no rash noted. She is not diaphoretic. No cyanosis or erythema. No pallor. Nails show  no clubbing.  Psychiatric: She has a normal mood and affect. Her speech is normal and behavior is normal. Judgment and thought content normal. She is not actively hallucinating. Cognition and memory are normal. She is attentive.  Nursing note and vitals reviewed.         Assessment & Plan:  A-viral URI with cough  P-Cough lozenges po q2h prn cough  May restart tussionex at home 28ml po bedtime prn cough may cause sedation/no alcohol or driving after  taking medication.  Prednisone 10mg  take 6 pills x 3 days  po daily with breakfast #21 RF0 dispensed from [facility].  Discussed possible side effects increased/decreased appetite, difficulty sleeping, increased blood sugar, increased blood pressure and heart rate.  Albuterol MDI 70mcg 1-2 puffs po q4-6h prn protracted cough/wheeze at home possible side effects increased heart rate.  Restart combivent as ordered by [facility]. Bronchitis simple, community acquired, may have started as viral (probably respiratory syncytial, parainfluenza, influenza, or adenovirus), but now evidence of acute purulent bronchitis with resultant bronchial edema and mucus formation.  Viruses are the most common cause of bronchial inflammation in otherwise healthy adults with acute bronchitis.  The appearance of sputum is not predictive of whether a bacterial infection is present.  Purulent sputum is most often caused by viral infections.  There are a small portion of those caused by non-viral agents being Mycoplama pneumonia.  Microscopic examination or C&S of sputum in the healthy adult with acute bronchitis is generally not helpful (usually negative or normal respiratory flora) other considerations being cough from upper respiratory tract infections, sinusitis or allergic syndromes (mild asthma or viral pneumonia).  Differential Diagnoses:  reactive airway disease (asthma, allergic aspergillosis (eosinophilia), chronic bronchitis, respiratory infection (sinusitis, common cold, pneumonia), congestive heart failure, reflux esophagitis, bronchogenic tumor, aspiration syndromes and/or exposure to pulmonary irritants/smoke.  Without high fever, severe dyspnea, lack of physical findings or other risk factors, I will hold on a chest radiograph and CBC at this time.  I discussed that approximately 50% of patients with acute bronchitis have a cough that lasts up to three weeks, and 25% for over a month.  Tylenol 500mg  one to two tablets every four to six  hours as needed for fever or myalgias given 8 UD from clinic stock.  No aspirin. Exitcare handout on bronchitis and inhaler use given to patient.  ER if hemopthysis, SOB, worst chest pain of life.   Patient instructed to follow up in 48 hours at 1200 appt or sooner if symptoms worsen despite plan of care.  Exitcare handout how to prevent asthma exacerbation, nonallergic rhinitis, viral URI, MDI use.  Patient verbalized agreement and understanding of treatment plan and had no further questions at this time.  P2:  hand washing and cover cough  Continue singulair 10mg  po qhs at home.  Patient may use normal saline nasal spray 2 sprays each nostril q2h wa as needed given 1 bottle from clinic stock. flonase 27mcg 1 spray each nostril BID.  Patient denied personal or family history of ENT cancer.  OTC antihistamine of choice  po daily.  Avoid triggers if possible.  Shower prior to bedtime if exposed to triggers.  If allergic dust/dust mites recommend mattress/pillow covers/encasements; washing linens, vacuuming, sweeping, dusting weekly.  Call or return to clinic as needed if these symptoms worsen or fail to improve as anticipated.   Exitcare handout on nonallergic rhinitis and sinus rinse given to patient.  Patient verbalized understanding of instructions, agreed with plan of care and had  no further questions at this time.  P2:  Avoidance and hand washing.

## 2018-02-21 ENCOUNTER — Encounter: Payer: Self-pay | Admitting: Registered Nurse

## 2018-02-21 ENCOUNTER — Ambulatory Visit: Payer: Self-pay | Admitting: Registered Nurse

## 2018-02-21 VITALS — BP 125/87 | HR 98 | Temp 98.7°F

## 2018-02-21 DIAGNOSIS — A084 Viral intestinal infection, unspecified: Secondary | ICD-10-CM

## 2018-02-21 DIAGNOSIS — J069 Acute upper respiratory infection, unspecified: Secondary | ICD-10-CM

## 2018-02-21 DIAGNOSIS — J019 Acute sinusitis, unspecified: Secondary | ICD-10-CM

## 2018-02-21 DIAGNOSIS — B9789 Other viral agents as the cause of diseases classified elsewhere: Secondary | ICD-10-CM

## 2018-02-21 NOTE — Progress Notes (Signed)
Subjective:    Patient ID: Kelly Reed, female    DOB: [birth year], 52 y.o.   MRN: [id number]  51y/o established female pt presenting for f/u of viral URI. Has taken 3 days of Prednisone. Has not needed to use the Albuterol inh. Took Mucinex DM last night with some relief. Has only taken Tylenol 1000mg  at 11:30a today. Also continuing saline, flonase, singulair.  Sore throat not as severe from Tuesday. Sinus pain and R otalgia unchanged. Had some diarrhea yesterday denied nausea/vomiting  Having increased hot flashes in menopause layering clothes and running fan in addition to air conditioning.       Review of Systems  Constitutional: Positive for activity change, diaphoresis and fatigue. Negative for appetite change, chills, fever and unexpected weight change.  HENT: Positive for congestion, ear pain, postnasal drip, rhinorrhea, sinus pressure, sinus pain and sore throat. Negative for dental problem, drooling, ear discharge, facial swelling, hearing loss, mouth sores, nosebleeds, sneezing, tinnitus, trouble swallowing and voice change.   Eyes: Negative for photophobia, pain, discharge, redness, itching and visual disturbance.  Respiratory: Positive for cough. Negative for choking, chest tightness, shortness of breath, wheezing and stridor.   Cardiovascular: Negative for chest pain, palpitations and leg swelling.  Gastrointestinal: Positive for diarrhea. Negative for abdominal distention, abdominal pain, nausea and vomiting.  Endocrine: Negative for cold intolerance and heat intolerance.  Genitourinary: Negative for difficulty urinating.  Musculoskeletal: Negative for arthralgias, back pain, gait problem, joint swelling, myalgias, neck pain and neck stiffness.  Skin: Negative for color change, pallor, rash and wound.  Allergic/Immunologic: Positive for environmental allergies. Negative for food allergies.  Neurological: Positive for headaches. Negative for dizziness, tremors, seizures,  syncope, facial asymmetry, speech difficulty, weakness, light-headedness and numbness.  Hematological: Negative for adenopathy. Does not bruise/bleed easily.  Psychiatric/Behavioral: Negative for agitation, confusion and sleep disturbance.       Objective:   Physical Exam  Constitutional: She is oriented to person, place, and time. She appears well-developed and well-nourished. She is active and cooperative.  Non-toxic appearance. She does not have a sickly appearance. She appears ill. No distress.  HENT:  Head: Normocephalic and atraumatic.  Right Ear: Hearing, external ear and ear canal normal. A middle ear effusion is present.  Left Ear: Hearing, external ear and ear canal normal. A middle ear effusion is present.  Nose: Mucosal edema and rhinorrhea present. No nose lacerations, sinus tenderness, nasal deformity, septal deviation or nasal septal hematoma. No epistaxis.  No foreign bodies. Right sinus exhibits maxillary sinus tenderness and frontal sinus tenderness. Left sinus exhibits maxillary sinus tenderness and frontal sinus tenderness.  Mouth/Throat: Uvula is midline and mucous membranes are normal. Mucous membranes are not pale, not dry and not cyanotic. She does not have dentures. No oral lesions. No trismus in the jaw. Normal dentition. No dental abscesses, uvula swelling, lacerations or dental caries. Posterior oropharyngeal edema and posterior oropharyngeal erythema present. No oropharyngeal exudate or tonsillar abscesses.  Frontal greater than maxillary sinuses TTP bilaterally; cobblestoning posterior pharynx; bilateral TMs air fulid level clear; bilateral nasal turbinates edema erythema and clear discharge  Eyes: Pupils are equal, round, and reactive to light. Conjunctivae, EOM and lids are normal. Right eye exhibits no chemosis, no discharge, no exudate and no hordeolum. No foreign body present in the right eye. Left eye exhibits no chemosis, no discharge, no exudate and no hordeolum.  No foreign body present in the left eye. Right conjunctiva is not injected. Right conjunctiva has no hemorrhage. Left conjunctiva is not  injected. Left conjunctiva has no hemorrhage. No scleral icterus. Right eye exhibits normal extraocular motion and no nystagmus. Left eye exhibits normal extraocular motion and no nystagmus. Right pupil is round and reactive. Left pupil is round and reactive. Pupils are equal.  Neck: Trachea normal and normal range of motion. Neck supple. No tracheal tenderness, no spinous process tenderness and no muscular tenderness present. No neck rigidity. No tracheal deviation, no edema, no erythema and normal range of motion present. No thyroid mass and no thyromegaly present.  Cardiovascular: Normal rate, regular rhythm, S1 normal, S2 normal, normal heart sounds and intact distal pulses. PMI is not displaced. Exam reveals no gallop and no friction rub.  No murmur heard. Pulmonary/Chest: Effort normal and breath sounds normal. No accessory muscle usage or stridor. No respiratory distress. She has no decreased breath sounds. She has no wheezes. She has no rhonchi. She has no rales. She exhibits no tenderness.  No cough observed in exam room; spoke full sentences without difficulty  Abdominal: Soft. She exhibits no distension.  Musculoskeletal: Normal range of motion. She exhibits no edema or tenderness.       Right shoulder: Normal.       Left shoulder: Normal.       Right hip: Normal.       Left hip: Normal.       Right knee: Normal.       Left knee: Normal.       Cervical back: Normal.       Right hand: Normal.       Left hand: Normal.  Lymphadenopathy:       Head (right side): No submental, no submandibular, no tonsillar, no preauricular, no posterior auricular and no occipital adenopathy present.       Head (left side): No submental, no submandibular, no tonsillar, no preauricular, no posterior auricular and no occipital adenopathy present.    She has no cervical  adenopathy.       Right cervical: No superficial cervical, no deep cervical and no posterior cervical adenopathy present.      Left cervical: No superficial cervical, no deep cervical and no posterior cervical adenopathy present.  Neurological: She is alert and oriented to person, place, and time. She has normal strength. She is not disoriented. She displays no atrophy and no tremor. No cranial nerve deficit or sensory deficit. She exhibits normal muscle tone. She displays no seizure activity. Coordination and gait normal. GCS eye subscore is 4. GCS verbal subscore is 5. GCS motor subscore is 6.  Skin: Skin is warm, dry and intact. No abrasion, no bruising, no burn, no ecchymosis, no laceration, no lesion, no petechiae and no rash noted. She is not diaphoretic. No cyanosis or erythema. No pallor. Nails show no clubbing.  Psychiatric: She has a normal mood and affect. Her speech is normal and behavior is normal. Judgment and thought content normal. Cognition and memory are normal.  Nursing note and vitals reviewed.         Assessment & Plan:  A-viral URI with cough, acute rhinosinusitis, viral gastroenteritis and seasonal allergic rhinitis, hot flashes  P-Cough lozenges po q2h prn cough  May restart tussionex at home 73ml po bedtime prn cough may cause sedation/no alcohol or driving after taking medication.  Prednisone 10mg  take 2 pills tomorrow with breakfast then 1 pill the following day previously dispensed from [facility].  Discussed possible side effects increased/decreased appetite, difficulty sleeping, increased blood sugar, increased blood pressure and heart rate.  Albuterol MDI  61mcg 1-2 puffs po q4-6h prn protracted cough/wheeze at home possible side effects increased heart rate.  Restart combivent as ordered by [facility]. Bronchitis simple, community acquired, may have started as viral (probably respiratory syncytial, parainfluenza, influenza, or adenovirus), but now evidence of acute purulent  bronchitis with resultant bronchial edema and mucus formation.  Viruses are the most common cause of bronchial inflammation in otherwise healthy adults with acute bronchitis.  The appearance of sputum is not predictive of whether a bacterial infection is present.  Purulent sputum is most often caused by viral infections.  There are a small portion of those caused by non-viral agents being Mycoplama pneumonia.  Microscopic examination or C&S of sputum in the healthy adult with acute bronchitis is generally not helpful (usually negative or normal respiratory flora) other considerations being cough from upper respiratory tract infections, sinusitis or allergic syndromes (mild asthma or viral pneumonia).  Differential Diagnoses:  reactive airway disease (asthma, allergic aspergillosis (eosinophilia), chronic bronchitis, respiratory infection (sinusitis, common cold, pneumonia), congestive heart failure, reflux esophagitis, bronchogenic tumor, aspiration syndromes and/or exposure to pulmonary irritants/smoke.  Without high fever, severe dyspnea, lack of physical findings or other risk factors, I will hold on a chest radiograph and CBC at this time.  I discussed that approximately 50% of patients with acute bronchitis have a cough that lasts up to three weeks, and 25% for over a month.  Tylenol 500mg  one to two tablets every four to six hours as needed for fever or myalgias given 8 UD from clinic stock.  No aspirin. Exitcare handout on bronchitis and inhaler use given to patient.  ER if hemopthysis, SOB, worst chest pain of life.   Patient instructed to follow up if worsening symptoms for re-evaluation despite plan of care.  Exitcare handout sinusitis  Patient verbalized agreement and understanding of treatment plan and had no further questions at this time.  P2:  hand washing and cover cough  Continue singulair 10mg  po qhs at home.  Patient may use normal saline nasal spray 2 sprays each nostril q2h wa as needed given  1 bottle from clinic stock. flonase 53mcg 1 spray each nostril BID.  Patient denied personal or family history of ENT cancer.  OTC antihistamine of choice  po daily.  Avoid triggers if possible.  Shower prior to bedtime if exposed to triggers.  If allergic dust/dust mites recommend mattress/pillow covers/encasements; washing linens, vacuuming, sweeping, dusting weekly.  Call or return to clinic as needed if these symptoms worsen or fail to improve as anticipated.   Exitcare handout on nonallergic rhinitis and sinus rinse given to patient.  Patient verbalized understanding of instructions, agreed with plan of care and had no further questions at this time.  P2:  Avoidance and hand washing.  If worsening tomorrow will send in Rx to [facility] levaquin 500mg  po daily x 5 days if no improvement patient will notify RN Hildred Alamin due to diabetes, asthma and anemia.  Discussed with patient that I think this is probably just viral at this time needs to be aggressive with nasal saline, prednisone will help with inflammation along with flonase/singulair and antihistamine.  I have recommended clear fluids and bland diet. Avoid dairy/spicy, fried and large portions of meat while having nausea. If vomiting hold po intake x 1 hour. Then sips clear fluids like broths, ginger ale, power ade, gatorade, pedialyte may advance to soft/bland if no vomiting x 24 hours and appetite returned otherwise hydration main focus. Return to the clinic if symptoms persist or  worsen; I have alerted the patient to call if high fever, dehydration, marked weakness, fainting, increased abdominal pain, blood in stool or vomit (red or black). Exitcare handout on foods to help relieve diarrhea and gastroenteritis Patient verbalized agreement and understanding of treatment plan and had no further questions at this time.   Discussed with patient acute illness, prednisone could be worsening menopause hot flashes continue to layer clothes, hydrate,  fan/air conditioning.  exitcare handout on menopause.

## 2018-02-21 NOTE — Patient Instructions (Addendum)
Sinus Rinse What is a sinus rinse? A sinus rinse is a simple home treatment that is used to rinse your sinuses with a sterile mixture of salt and water (saline solution). Sinuses are air-filled spaces in your skull behind the bones of your face and forehead that open into your nasal cavity. You will use the following:  Saline solution.  Neti pot or spray bottle. This releases the saline solution into your nose and through your sinuses. Neti pots and spray bottles can be purchased at [employer], a health food store, or online.  When would I do a sinus rinse? A sinus rinse can help to clear mucus, dirt, dust, or pollen from the nasal cavity. You may do a sinus rinse when you have a cold, a virus, nasal allergy symptoms, a sinus infection, or stuffiness in the nose or sinuses. If you are considering a sinus rinse:  Ask your child's health care provider before performing a sinus rinse on your child.  Do not do a sinus rinse if you have had ear or nasal surgery, ear infection, or blocked ears.  How do I do a sinus rinse?  Wash your hands.  Disinfect your device according to the directions provided and then dry it.  Use the solution that comes with your device or one that is sold separately in stores. Follow the mixing directions on the package.  Fill your device with the amount of saline solution as directed by the device instructions.  Stand over a sink and tilt your head sideways over the sink.  Place the spout of the device in your upper nostril (the one closer to the ceiling).  Gently pour or squeeze the saline solution into the nasal cavity. The liquid should drain to the lower nostril if you are not overly congested.  Gently blow your nose. Blowing too hard may cause ear pain.  Repeat in the other nostril.  Clean and rinse your device with clean water and then air-dry it. Are there risks of a sinus rinse? Sinus rinse is generally very safe and effective. However,  there are a few risks, which include:  A burning sensation in the sinuses. This may happen if you do not make the saline solution as directed. Make sure to follow all directions when making the saline solution.  Infection from contaminated water. This is rare, but possible.  Nasal irritation.  This information is not intended to replace advice given to you by your health care provider. Make sure you discuss any questions you have with your health care provider. Document Released: 01/21/2014 Document Revised: 05/23/2016 Document Reviewed: 11/11/2013 Elsevier Interactive Patient Education  2017 [employer] Sinusitis, Adult Sinusitis is soreness and inflammation of your sinuses. Sinuses are hollow spaces in the bones around your face. Your sinuses are located:  Around your eyes.  In the middle of your forehead.  Behind your nose.  In your cheekbones.  Your sinuses and nasal passages are lined with a stringy fluid (mucus). Mucus normally drains out of your sinuses. When your nasal tissues become inflamed or swollen, the mucus can become trapped or blocked so air cannot flow through your sinuses. This allows bacteria, viruses, and funguses to grow, which leads to infection. Sinusitis can develop quickly and last for 7?10 days (acute) or for more than 12 weeks (chronic). Sinusitis often develops after a cold. What are the causes? This condition is caused by anything that creates swelling in the sinuses or stops mucus from draining, including:  Allergies.  Asthma.  Bacterial or viral infection.  Abnormally shaped bones between the nasal passages.  Nasal growths that contain mucus (nasal polyps).  Narrow sinus openings.  Pollutants, such as chemicals or irritants in the air.  A foreign object stuck in the nose.  A fungal infection. This is rare.  What increases the risk? The following factors may make you more likely to develop this condition:  Having allergies or  asthma.  Having had a recent cold or respiratory tract infection.  Having structural deformities or blockages in your nose or sinuses.  Having a weak immune system.  Doing a lot of swimming or diving.  Overusing nasal sprays.  Smoking.  What are the signs or symptoms? The main symptoms of this condition are pain and a feeling of pressure around the affected sinuses. Other symptoms include:  Upper toothache.  Earache.  Headache.  Bad breath.  Decreased sense of smell and taste.  A cough that may get worse at night.  Fatigue.  Fever.  Thick drainage from your nose. The drainage is often green and it may contain pus (purulent).  Stuffy nose or congestion.  Postnasal drip. This is when extra mucus collects in the throat or back of the nose.  Swelling and warmth over the affected sinuses.  Sore throat.  Sensitivity to light.  How is this diagnosed? This condition is diagnosed based on symptoms, a medical history, and a physical exam. To find out if your condition is acute or chronic, your health care provider may:  Look in your nose for signs of nasal polyps.  Tap over the affected sinus to check for signs of infection.  View the inside of your sinuses using an imaging device that has a light attached (endoscope).  If your health care provider suspects that you have chronic sinusitis, you may also:  Be tested for allergies.  Have a sample of mucus taken from your nose (nasal culture) and checked for bacteria.  Have a mucus sample examined to see if your sinusitis is related to an allergy.  If your sinusitis does not respond to treatment and it lasts longer than 8 weeks, you may have an MRI or CT scan to check your sinuses. These scans also help to determine how severe your infection is. In rare cases, a bone biopsy may be done to rule out more serious types of fungal sinus disease. How is this treated? Treatment for sinusitis depends on the cause and  whether your condition is chronic or acute. If a virus is causing your sinusitis, your symptoms will go away on their own within 10 days. You may be given medicines to relieve your symptoms, including:  Topical nasal decongestants. They shrink swollen nasal passages and let mucus drain from your sinuses.  Antihistamines. These drugs block inflammation that is triggered by allergies. This can help to ease swelling in your nose and sinuses.  Topical nasal corticosteroids. These are nasal sprays that ease inflammation and swelling in your nose and sinuses.  Nasal saline washes. These rinses can help to get rid of thick mucus in your nose.  If your condition is caused by bacteria, you will be given an antibiotic medicine. If your condition is caused by a fungus, you will be given an antifungal medicine. Surgery may be needed to correct underlying conditions, such as narrow nasal passages. Surgery may also be needed to remove polyps. Follow these instructions at home: Medicines  Take, use, or apply over-the-counter and prescription medicines only as told by  your health care provider. These may include nasal sprays.  If you were prescribed an antibiotic medicine, take it as told by your health care provider. Do not stop taking the antibiotic even if you start to feel better. Hydrate and Humidify  Drink enough water to keep your urine clear or pale yellow. Staying hydrated will help to thin your mucus.  Use a cool mist humidifier to keep the humidity level in your home above 50%.  Inhale steam for 10-15 minutes, 3-4 times a day or as told by your health care provider. You can do this in the bathroom while a hot shower is running.  Limit your exposure to cool or dry air. Rest  Rest as much as possible.  Sleep with your head raised (elevated).  Make sure to get enough sleep each night. General instructions  Apply a warm, moist washcloth to your face 3-4 times a day or as told by your  health care provider. This will help with discomfort.  Wash your hands often with soap and water to reduce your exposure to viruses and other germs. If soap and water are not available, use hand sanitizer.  Do not smoke. Avoid being around people who are smoking (secondhand smoke).  Keep all follow-up visits as told by your health care provider. This is important. Contact a health care provider if:  You have a fever.  Your symptoms get worse.  Your symptoms do not improve within 10 days. Get help right away if:  You have a severe headache.  You have persistent vomiting.  You have pain or swelling around your face or eyes.  You have vision problems.  You develop confusion.  Your neck is stiff.  You have trouble breathing. This information is not intended to replace advice given to you by your health care provider. Make sure you discuss any questions you have with your health care provider. Document Released: 06/26/2005 Document Revised: 02/20/2016 Document Reviewed: 04/21/2015 Elsevier Interactive Patient Education  2018 [employer]. Viral Gastroenteritis, Adult Viral gastroenteritis is also known as the stomach flu. This condition is caused by certain germs (viruses). These germs can be passed from person to person very easily (are very contagious). This condition can cause sudden watery poop (diarrhea), fever, and throwing up (vomiting). Having watery poop and throwing up can make you feel weak and cause you to get dehydrated. Dehydration can make you tired and thirsty, make you have a dry mouth, and make it so you pee (urinate) less often. Older adults and people with other diseases or a weak defense system (immune system) are at higher risk for dehydration. It is important to replace the fluids that you lose from having watery poop and throwing up. Follow these instructions at home: Follow instructions from your doctor about how to care for yourself at home. Eating and  drinking  Follow these instructions as told by your doctor:  Take an oral rehydration solution (ORS). This is a drink that is sold at pharmacies and stores.  Drink clear fluids in small amounts as you are able, such as: ? Water. ? Ice chips. ? Diluted fruit juice. ? Low-calorie sports drinks.  Eat bland, easy-to-digest foods in small amounts as you are able, such as: ? Bananas. ? Applesauce. ? Rice. ? Low-fat (lean) meats. ? Toast. ? Crackers.  Avoid fluids that have a lot of sugar or caffeine in them.  Avoid alcohol.  Avoid spicy or fatty foods.  General instructions  Drink enough fluid to keep  your pee (urine) clear or pale yellow.  Wash your hands often. If you cannot use soap and water, use hand sanitizer.  Make sure that all people in your home wash their hands well and often.  Rest at home while you get better.  Take over-the-counter and prescription medicines only as told by your doctor.  Watch your condition for any changes.  Take a warm bath to help with any burning or pain from having watery poop.  Keep all follow-up visits as told by your doctor. This is important. Contact a doctor if:  You cannot keep fluids down.  Your symptoms get worse.  You have new symptoms.  You feel light-headed or dizzy.  You have muscle cramps. Get help right away if:  You have chest pain.  You feel very weak or you pass out (faint).  You see blood in your throw-up.  Your throw-up looks like coffee grounds.  You have bloody or black poop (stools) or poop that look like tar.  You have a very bad headache, a stiff neck, or both.  You have a rash.  You have very bad pain, cramping, or bloating in your belly (abdomen).  You have trouble breathing.  You are breathing very quickly.  Your heart is beating very quickly.  Your skin feels cold and clammy.  You feel confused.  You have pain when you pee.  You have signs of dehydration, such as: ? Dark pee,  hardly any pee, or no pee. ? Cracked lips. ? Dry mouth. ? Sunken eyes. ? Sleepiness. ? Weakness. This information is not intended to replace advice given to you by your health care provider. Make sure you discuss any questions you have with your health care provider. Document Released: 12/13/2007 Document Revised: 01/14/2016 Document Reviewed: 03/02/2015 Elsevier Interactive Patient Education  2017 [place] Choices to Help Relieve Diarrhea, Adult When you have diarrhea, the foods you eat and your eating habits are very important. Choosing the right foods and drinks can help:  Relieve diarrhea.  Replace lost fluids and nutrients.  Prevent dehydration.  What general guidelines should I follow? Relieving diarrhea  Choose foods with less than 2 g or .07 oz. of fiber per serving.  Limit fats to less than 8 tsp (38 g or 1.34 oz.) a day.  Avoid the following: ? Foods and beverages sweetened with high-fructose corn syrup, honey, or sugar alcohols such as xylitol, sorbitol, and mannitol. ? Foods that contain a lot of fat or sugar. ? Fried, greasy, or spicy foods. ? High-fiber grains, breads, and cereals. ? Raw fruits and vegetables.  Eat foods that are rich in probiotics. These foods include dairy products such as yogurt and fermented milk products. They help increase healthy bacteria in the stomach and intestines (gastrointestinal tract, or GI tract).  If you have lactose intolerance, avoid dairy products. These may make your diarrhea worse.  Take medicine to help stop diarrhea (antidiarrheal medicine) only as told by your health care provider. Replacing nutrients  Eat small meals or snacks every 3-4 hours.  Eat bland foods, such as white rice, toast, or baked potato, until your diarrhea starts to get better. Gradually reintroduce nutrient-rich foods as tolerated or as told by your health care provider. This includes: ? Well-cooked protein foods. ? Peeled, seeded, and  soft-cooked fruits and vegetables. ? Low-fat dairy products.  Take vitamin and mineral supplements as told by your health care provider. Preventing dehydration   Start by sipping water or a special  solution to prevent dehydration (oral rehydration solution, ORS). Urine that is clear or pale yellow means that you are getting enough fluid.  Try to drink at least 8-10 cups of fluid each day to help replace lost fluids.  You may add other liquids in addition to water, such as clear juice or decaffeinated sports drinks, as tolerated or as told by your health care provider.  Avoid drinks with caffeine, such as coffee, tea, or soft drinks.  Avoid alcohol. What foods are recommended? The items listed may not be a complete list. Talk with your health care provider about what dietary choices are best for you. Grains White rice. White, [place], or pita breads (fresh or toasted), including plain rolls, buns, or bagels. White pasta. Saltine, soda, or graham crackers. Pretzels. Low-fiber cereal. Cooked cereals made with water (such as cornmeal, farina, or cream cereals). Plain muffins. Matzo. Melba toast. Zwieback. Vegetables Potatoes (without the skin). Most well-cooked and canned vegetables without skins or seeds. Tender lettuce. Fruits Apple sauce. Fruits canned in juice. Cooked apricots, cherries, grapefruit, peaches, pears, or plums. Fresh bananas and cantaloupe. Meats and other protein foods Baked or boiled chicken. Eggs. Tofu. Fish. Seafood. Smooth nut butters. Ground or well-cooked tender beef, ham, veal, lamb, pork, or poultry. Dairy Plain yogurt, kefir, and unsweetened liquid yogurt. Lactose-free milk, buttermilk, skim milk, or soy milk. Low-fat or nonfat hard cheese. Beverages Water. Low-calorie sports drinks. Fruit juices without pulp. Strained tomato and vegetable juices. Decaffeinated teas. Sugar-free beverages not sweetened with sugar alcohols. Oral rehydration solutions, if approved by  your health care provider. Seasoning and other foods Bouillon, broth, or soups made from recommended foods. What foods are not recommended? The items listed may not be a complete list. Talk with your health care provider about what dietary choices are best for you. Grains Whole grain, whole wheat, bran, or rye breads, rolls, pastas, and crackers. Wild or brown rice. Whole grain or bran cereals. Barley. Oats and oatmeal. Corn tortillas or taco shells. Granola. Popcorn. Vegetables Raw vegetables. Fried vegetables. Cabbage, broccoli, Brussels sprouts, artichokes, baked beans, beet greens, corn, kale, legumes, peas, sweet potatoes, and yams. Potato skins. Cooked spinach and cabbage. Fruits Dried fruit, including raisins and dates. Raw fruits. Stewed or dried prunes. Canned fruits with syrup. Meat and other protein foods Fried or fatty meats. Deli meats. Chunky nut butters. Nuts and seeds. Beans and lentils. Berniece Salines. Hot dogs. Sausage. Dairy High-fat cheeses. Whole milk, chocolate milk, and beverages made with milk, such as milk shakes. Half-and-half. Cream. sour cream. Ice cream. Beverages Caffeinated beverages (such as coffee, tea, soda, or energy drinks). Alcoholic beverages. Fruit juices with pulp. Prune juice. Soft drinks sweetened with high-fructose corn syrup or sugar alcohols. High-calorie sports drinks. Fats and oils Butter. Cream sauces. Margarine. Salad oils. Plain salad dressings. Olives. Avocados. Mayonnaise. Sweets and desserts Sweet rolls, doughnuts, and sweet breads. Sugar-free desserts sweetened with sugar alcohols such as xylitol and sorbitol. Seasoning and other foods Honey. Hot sauce. Chili powder. Gravy. Cream-based or milk-based soups. Pancakes and waffles. Summary  When you have diarrhea, the foods you eat and your eating habits are very important.  Make sure you get at least 8-10 cups of fluid each day, or enough to keep your urine clear or pale yellow.  Eat bland foods  and gradually reintroduce healthy, nutrient-rich foods as tolerated, or as told by your health care provider.  Avoid high-fiber, fried, greasy, or spicy foods. This information is not intended to replace advice given to you by  your health care provider. Make sure you discuss any questions you have with your health care provider. Document Released: 09/16/2003 Document Revised: 06/23/2016 Document Reviewed: 06/23/2016 Elsevier Interactive Patient Education  2018 [employer]. Sinusitis, Adult Sinusitis is soreness and inflammation of your sinuses. Sinuses are hollow spaces in the bones around your face. Your sinuses are located:  Around your eyes.  In the middle of your forehead.  Behind your nose.  In your cheekbones.  Your sinuses and nasal passages are lined with a stringy fluid (mucus). Mucus normally drains out of your sinuses. When your nasal tissues become inflamed or swollen, the mucus can become trapped or blocked so air cannot flow through your sinuses. This allows bacteria, viruses, and funguses to grow, which leads to infection. Sinusitis can develop quickly and last for 7?10 days (acute) or for more than 12 weeks (chronic). Sinusitis often develops after a cold. What are the causes? This condition is caused by anything that creates swelling in the sinuses or stops mucus from draining, including:  Allergies.  Asthma.  Bacterial or viral infection.  Abnormally shaped bones between the nasal passages.  Nasal growths that contain mucus (nasal polyps).  Narrow sinus openings.  Pollutants, such as chemicals or irritants in the air.  A foreign object stuck in the nose.  A fungal infection. This is rare.  What increases the risk? The following factors may make you more likely to develop this condition:  Having allergies or asthma.  Having had a recent cold or respiratory tract infection.  Having structural deformities or blockages in your nose or sinuses.  Having  a weak immune system.  Doing a lot of swimming or diving.  Overusing nasal sprays.  Smoking.  What are the signs or symptoms? The main symptoms of this condition are pain and a feeling of pressure around the affected sinuses. Other symptoms include:  Upper toothache.  Earache.  Headache.  Bad breath.  Decreased sense of smell and taste.  A cough that may get worse at night.  Fatigue.  Fever.  Thick drainage from your nose. The drainage is often green and it may contain pus (purulent).  Stuffy nose or congestion.  Postnasal drip. This is when extra mucus collects in the throat or back of the nose.  Swelling and warmth over the affected sinuses.  Sore throat.  Sensitivity to light.  How is this diagnosed? This condition is diagnosed based on symptoms, a medical history, and a physical exam. To find out if your condition is acute or chronic, your health care provider may:  Look in your nose for signs of nasal polyps.  Tap over the affected sinus to check for signs of infection.  View the inside of your sinuses using an imaging device that has a light attached (endoscope).  If your health care provider suspects that you have chronic sinusitis, you may also:  Be tested for allergies.  Have a sample of mucus taken from your nose (nasal culture) and checked for bacteria.  Have a mucus sample examined to see if your sinusitis is related to an allergy.  If your sinusitis does not respond to treatment and it lasts longer than 8 weeks, you may have an MRI or CT scan to check your sinuses. These scans also help to determine how severe your infection is. In rare cases, a bone biopsy may be done to rule out more serious types of fungal sinus disease. How is this treated? Treatment for sinusitis depends on the cause and  whether your condition is chronic or acute. If a virus is causing your sinusitis, your symptoms will go away on their own within 10 days. You may be given  medicines to relieve your symptoms, including:  Topical nasal decongestants. They shrink swollen nasal passages and let mucus drain from your sinuses.  Antihistamines. These drugs block inflammation that is triggered by allergies. This can help to ease swelling in your nose and sinuses.  Topical nasal corticosteroids. These are nasal sprays that ease inflammation and swelling in your nose and sinuses.  Nasal saline washes. These rinses can help to get rid of thick mucus in your nose.  If your condition is caused by bacteria, you will be given an antibiotic medicine. If your condition is caused by a fungus, you will be given an antifungal medicine. Surgery may be needed to correct underlying conditions, such as narrow nasal passages. Surgery may also be needed to remove polyps. Follow these instructions at home: Medicines  Take, use, or apply over-the-counter and prescription medicines only as told by your health care provider. These may include nasal sprays.  If you were prescribed an antibiotic medicine, take it as told by your health care provider. Do not stop taking the antibiotic even if you start to feel better. Hydrate and Humidify  Drink enough water to keep your urine clear or pale yellow. Staying hydrated will help to thin your mucus.  Use a cool mist humidifier to keep the humidity level in your home above 50%.  Inhale steam for 10-15 minutes, 3-4 times a day or as told by your health care provider. You can do this in the bathroom while a hot shower is running.  Limit your exposure to cool or dry air. Rest  Rest as much as possible.  Sleep with your head raised (elevated).  Make sure to get enough sleep each night. General instructions  Apply a warm, moist washcloth to your face 3-4 times a day or as told by your health care provider. This will help with discomfort.  Wash your hands often with soap and water to reduce your exposure to viruses and other germs. If soap  and water are not available, use hand sanitizer.  Do not smoke. Avoid being around people who are smoking (secondhand smoke).  Keep all follow-up visits as told by your health care provider. This is important. Contact a health care provider if:  You have a fever.  Your symptoms get worse.  Your symptoms do not improve within 10 days. Get help right away if:  You have a severe headache.  You have persistent vomiting.  You have pain or swelling around your face or eyes.  You have vision problems.  You develop confusion.  Your neck is stiff.  You have trouble breathing. This information is not intended to replace advice given to you by your health care provider. Make sure you discuss any questions you have with your health care provider. Document Released: 06/26/2005 Document Revised: 02/20/2016 Document Reviewed: 04/21/2015 Elsevier Interactive Patient Education  2018 [employer]. Menopause Menopause is the normal time of life when menstrual periods stop completely. Menopause is complete when you have missed 12 consecutive menstrual periods. It usually occurs between the ages of 20 years and 18 years. Very rarely does a woman develop menopause before the age of 27 years. At menopause, your ovaries stop producing the female hormones estrogen and progesterone. This can cause undesirable symptoms and also affect your health. Sometimes the symptoms may occur 4-5 years  before the menopause begins. There is no relationship between menopause and:  Oral contraceptives.  Number of children you had.  Race.  The age your menstrual periods started (menarche).  Heavy smokers and very thin women may develop menopause earlier in life. What are the causes?  The ovaries stop producing the female hormones estrogen and progesterone. Other causes include:  Surgery to remove both ovaries.  The ovaries stop functioning for no known reason.  Tumors of the pituitary gland in the  brain.  Medical disease that affects the ovaries and hormone production.  Radiation treatment to the abdomen or pelvis.  Chemotherapy that affects the ovaries.  What are the signs or symptoms?  Hot flashes.  Night sweats.  Decrease in sex drive.  Vaginal dryness and thinning of the vagina causing painful intercourse.  Dryness of the skin and developing wrinkles.  Headaches.  Tiredness.  Irritability.  Memory problems.  Weight gain.  Bladder infections.  Hair growth of the face and chest.  Infertility. More serious symptoms include:  Loss of bone (osteoporosis) causing breaks (fractures).  Depression.  Hardening and narrowing of the arteries (atherosclerosis) causing heart attacks and strokes.  How is this diagnosed?  When the menstrual periods have stopped for 12 straight months.  Physical exam.  Hormone studies of the blood. How is this treated? There are many treatment choices and nearly as many questions about them. The decisions to treat or not to treat menopausal changes is an individual choice made with your health care provider. Your health care provider can discuss the treatments with you. Together, you can decide which treatment will work best for you. Your treatment choices may include:  Hormone therapy (estrogen and progesterone).  Non-hormonal medicines.  Treating the individual symptoms with medicine (for example antidepressants for depression).  Herbal medicines that may help specific symptoms.  Counseling by a psychiatrist or psychologist.  Group therapy.  Lifestyle changes including: ? Eating healthy. ? Regular exercise. ? Limiting caffeine and alcohol. ? Stress management and meditation.  No treatment.  Follow these instructions at home:  Take the medicine your health care provider gives you as directed.  Get plenty of sleep and rest.  Exercise regularly.  Eat a diet that contains calcium (good for the bones) and soy  products (acts like estrogen hormone).  Avoid alcoholic beverages.  Do not smoke.  If you have hot flashes, dress in layers.  Take supplements, calcium, and vitamin D to strengthen bones.  You can use over-the-counter lubricants or moisturizers for vaginal dryness.  Group therapy is sometimes very helpful.  Acupuncture may be helpful in some cases. Contact a health care provider if:  You are not sure you are in menopause.  You are having menopausal symptoms and need advice and treatment.  You are still having menstrual periods after age 21 years.  You have pain with intercourse.  Menopause is complete (no menstrual period for 12 months) and you develop vaginal bleeding.  You need a referral to a specialist (gynecologist, psychiatrist, or psychologist) for treatment. Get help right away if:  You have severe depression.  You have excessive vaginal bleeding.  You fell and think you have a broken bone.  You have pain when you urinate.  You develop leg or chest pain.  You have a fast pounding heart beat (palpitations).  You have severe headaches.  You develop vision problems.  You feel a lump in your breast.  You have abdominal pain or severe indigestion. This information is not intended  to replace advice given to you by your health care provider. Make sure you discuss any questions you have with your health care provider. Document Released: 09/16/2003 Document Revised: 12/02/2015 Document Reviewed: 01/23/2013 Elsevier Interactive Patient Education  2017 [employer].

## 2018-03-14 ENCOUNTER — Ambulatory Visit: Payer: PRIVATE HEALTH INSURANCE | Admitting: Family Medicine

## 2018-03-14 ENCOUNTER — Ambulatory Visit: Payer: Self-pay | Admitting: Registered Nurse

## 2018-03-14 ENCOUNTER — Encounter: Payer: Self-pay | Admitting: Registered Nurse

## 2018-03-14 ENCOUNTER — Telehealth: Payer: Self-pay | Admitting: Family Medicine

## 2018-03-14 DIAGNOSIS — R05 Cough: Secondary | ICD-10-CM

## 2018-03-14 DIAGNOSIS — J302 Other seasonal allergic rhinitis: Secondary | ICD-10-CM

## 2018-03-14 DIAGNOSIS — R059 Cough, unspecified: Secondary | ICD-10-CM

## 2018-03-14 MED ORDER — HYDROCOD POLST-CPM POLST ER 10-8 MG/5ML PO SUER: 5.0000 mL | Freq: Two times a day (BID) | ORAL | 0 refills | Status: AC

## 2018-03-14 MED ORDER — MONTELUKAST SODIUM 10 MG PO TABS: 10.0000 mg | ORAL_TABLET | Freq: Every day | ORAL | 3 refills | Status: DC

## 2018-03-14 NOTE — Progress Notes (Signed)
Subjective:    Patient ID: Kelly Reed, female    DOB: 05/30/1966, 52 y.o.   MRN: [id number]  51y/o Caucasian female pt c/o frontal and maxillary sinus pressure, nasal congestion, rhinorrhea, sneezing, bilateral ear congestion, frequent throat clearing, PND, dry cough. Similar sx in August that resolved completely. These sx began this past Sunday, 5 days ago. Son with same sx. Using Tussionex at home, ran out, then Robitussin and ran out. Also using Vicks under nose and Vicks insert in humidifier. Also Flonase, saline spray, Xyzal. Just using Breo inhaler daily. Has not needed albuterol inhaler yet.  Does not want prednisone or antibiotic since just recently completely courses.     Review of Systems  Constitutional: Negative for activity change, appetite change, chills, diaphoresis, fatigue, fever and unexpected weight change.  HENT: Positive for congestion, ear pain, postnasal drip, rhinorrhea, sinus pressure, sinus pain and sore throat. Negative for dental problem, drooling, ear discharge, facial swelling, hearing loss, mouth sores, nosebleeds, sneezing, tinnitus, trouble swallowing and voice change.   Eyes: Negative for photophobia, pain, discharge, redness, itching and visual disturbance.  Respiratory: Positive for cough. Negative for choking, chest tightness, shortness of breath, wheezing and stridor.   Cardiovascular: Negative for chest pain, palpitations and leg swelling.  Gastrointestinal: Negative for abdominal distention, abdominal pain, blood in stool, constipation, diarrhea, nausea and vomiting.  Endocrine: Negative for cold intolerance and heat intolerance.  Genitourinary: Negative for difficulty urinating, dysuria and hematuria.  Musculoskeletal: Negative for arthralgias, back pain, gait problem, joint swelling, myalgias, neck pain and neck stiffness.  Skin: Negative for color change, pallor, rash and wound.  Allergic/Immunologic: Positive for environmental allergies. Negative  for food allergies.  Neurological: Positive for headaches. Negative for dizziness, tremors, seizures, syncope, facial asymmetry, speech difficulty, weakness, light-headedness and numbness.  Hematological: Negative for adenopathy. Does not bruise/bleed easily.  Psychiatric/Behavioral: Negative for agitation, behavioral problems, confusion and sleep disturbance.       Objective:   Physical Exam  Constitutional: She is oriented to person, place, and time. She appears well-developed and well-nourished. She is active and cooperative.  Non-toxic appearance. She does not have a sickly appearance. She appears ill. No distress.  HENT:  Head: Normocephalic and atraumatic.  Right Ear: Hearing, external ear and ear canal normal. A middle ear effusion is present.  Left Ear: Hearing, external ear and ear canal normal. A middle ear effusion is present.  Nose: Mucosal edema and rhinorrhea present. No nose lacerations, sinus tenderness, nasal deformity, septal deviation or nasal septal hematoma. No epistaxis.  No foreign bodies. Right sinus exhibits maxillary sinus tenderness and frontal sinus tenderness. Left sinus exhibits maxillary sinus tenderness and frontal sinus tenderness.  Mouth/Throat: Uvula is midline and mucous membranes are normal. Mucous membranes are not pale, not dry and not cyanotic. She does not have dentures. No oral lesions. No trismus in the jaw. Normal dentition. No dental abscesses, uvula swelling, lacerations or dental caries. Posterior oropharyngeal edema and posterior oropharyngeal erythema present. No oropharyngeal exudate or tonsillar abscesses.  Eyes: Pupils are equal, round, and reactive to light. Conjunctivae, EOM and lids are normal. Right eye exhibits no chemosis, no discharge, no exudate and no hordeolum. No foreign body present in the right eye. Left eye exhibits no chemosis, no discharge, no exudate and no hordeolum. No foreign body present in the left eye. Right conjunctiva is not  injected. Right conjunctiva has no hemorrhage. Left conjunctiva is not injected. Left conjunctiva has no hemorrhage. No scleral icterus. Right eye exhibits normal  extraocular motion and no nystagmus. Left eye exhibits normal extraocular motion and no nystagmus. Right pupil is round and reactive. Left pupil is round and reactive. Pupils are equal.  Neck: Trachea normal and normal range of motion. Neck supple. No tracheal tenderness, no spinous process tenderness and no muscular tenderness present. No neck rigidity. No tracheal deviation, no edema, no erythema and normal range of motion present. No thyroid mass and no thyromegaly present.  Cardiovascular: Normal rate, regular rhythm, S1 normal, S2 normal, normal heart sounds and intact distal pulses. PMI is not displaced. Exam reveals no gallop and no friction rub.  No murmur heard. Pulmonary/Chest: Effort normal and breath sounds normal. No accessory muscle usage or stridor. No respiratory distress. She has no decreased breath sounds. She has no wheezes. She has no rhonchi. She has no rales. She exhibits no tenderness.  Abdominal: Soft. She exhibits no distension.  Musculoskeletal: Normal range of motion. She exhibits no edema or tenderness.       Right shoulder: Normal.       Left shoulder: Normal.       Right hip: Normal.       Left hip: Normal.       Right knee: Normal.       Left knee: Normal.       Cervical back: Normal.       Right hand: Normal.       Left hand: Normal.  Lymphadenopathy:       Head (right side): No submental, no submandibular, no tonsillar, no preauricular, no posterior auricular and no occipital adenopathy present.       Head (left side): No submental, no submandibular, no tonsillar, no preauricular, no posterior auricular and no occipital adenopathy present.    She has no cervical adenopathy.       Right cervical: No superficial cervical, no deep cervical and no posterior cervical adenopathy present.      Left cervical:  No superficial cervical, no deep cervical and no posterior cervical adenopathy present.  Neurological: She is alert and oriented to person, place, and time. She has normal strength. She is not disoriented. She displays no atrophy and no tremor. No cranial nerve deficit or sensory deficit. She exhibits normal muscle tone. She displays no seizure activity. Coordination and gait normal. GCS eye subscore is 4. GCS verbal subscore is 5. GCS motor subscore is 6.  Skin: Skin is warm, dry and intact. Capillary refill takes less than 2 seconds. No abrasion, no bruising, no burn, no ecchymosis, no laceration, no lesion, no petechiae and no rash noted. She is not diaphoretic. No cyanosis or erythema. No pallor. Nails show no clubbing.  Psychiatric: She has a normal mood and affect. Her speech is normal and behavior is normal. Judgment and thought content normal. Cognition and memory are normal.  Nursing note and vitals reviewed.     Maxillary greater than frontal sinuses TTP bilaterally; bilateral TMs air fluid level clear; cobblestoning posterior pharynx; frequent nonproductive cough in exam room ;spoke full sentences without difficulty; nasal turbinates edema erythema clear discharge frequent nose blowing in exam room clear thin mucous    Assessment & Plan:  A-viral URI and seasonal allergic rhinitis flare  P-refilled tussionex 69ml po BID prn cough rhinorhea #120 RF0 handwritten Rx and refilled singulair 10mg  po qhs given handwritten receipt also #90 RF0 along with electronic as does not know pharmacy name near house in [place].  Sent electronic Rx to her usual pharmacy in case she is picking  up refills there also by her PCM office not home.  Patient did not want to take prednisone and holding on levaquin at this time as viral/allergic inflammation sinuses restart singulair, continue dymista nasal spray as prescribed by PCM, nasal saline 2 sprays each nostril q2h prn congestion, xyzal 5mg  po daily,  phenylephrine 5-10mg  po q6h prn rhinitis OTC  Cough drops po q2h prn cough.  Continue Breo inhaler as prescribed by PCM and albuterol 1-2 puffs po q4-6h prn protracted cough or wheezing.  Restart flonase 1 spray each nostril BID, saline 2 sprays each nostril q2h wa prn congestion.   Denied personal or family history of ENT cancer.  Shower BID especially prior to bed. No evidence of systemic bacterial infection, non toxic and well hydrated.  I do not see where any further testing or imaging is necessary at this time.   I will suggest supportive care, rest, good hygiene and encourage the patient to take adequate fluids.  The patient is to return to clinic or EMERGENCY ROOM if symptoms worsen or change significantly.  Exitcare handout on sinusitis and sinus rinse given to patient.  Patient verbalized agreement and understanding of treatment plan and had no further questions at this time.   P2:  Hand washing and cover cough  Patient may use normal saline nasal spray 2 sprays each nostril q2h wa as needed. dymista nasal spray as PCM ordered  Patient denied personal or family history of ENT cancer.  OTC antihistamine of choice claritin/zyrtec 10mg  po daily.  Avoid triggers if possible.  Shower prior to bedtime if exposed to triggers.  If allergic dust/dust mites recommend mattress/pillow covers/encasements; washing linens, vacuuming, sweeping, dusting weekly.  Call or return to clinic as needed if these symptoms worsen or fail to improve as anticipated.   Exitcare handout on allergic rhinitis and sinus rinse given to patient.  Patient verbalized understanding of instructions, agreed with plan of care and had no further questions at this time.  P2:  Avoidance and hand washing.

## 2018-03-14 NOTE — Patient Instructions (Signed)
Allergic Rhinitis, Adult Allergic rhinitis is an allergic reaction that affects the mucous membrane inside the nose. It causes sneezing, a runny or stuffy nose, and the feeling of mucus going down the back of the throat (postnasal drip). Allergic rhinitis can be mild to severe. There are two types of allergic rhinitis:  Seasonal. This type is also called hay fever. It happens only during certain seasons.  Perennial. This type can happen at any time of the year.  What are the causes? This condition happens when the body's defense system (immune system) responds to certain harmless substances called allergens as though they were germs.  Seasonal allergic rhinitis is triggered by pollen, which can come from grasses, trees, and weeds. Perennial allergic rhinitis may be caused by:  House dust mites.  Pet dander.  Mold spores.  What are the signs or symptoms? Symptoms of this condition include:  Sneezing.  Runny or stuffy nose (nasal congestion).  Postnasal drip.  Itchy nose.  Tearing of the eyes.  Trouble sleeping.  Daytime sleepiness.  How is this diagnosed? This condition may be diagnosed based on:  Your medical history.  A physical exam.  Tests to check for related conditions, such as: ? Asthma. ? Pink eye. ? Ear infection. ? Upper respiratory infection.  Tests to find out which allergens trigger your symptoms. These may include skin or blood tests.  How is this treated? There is no cure for this condition, but treatment can help control symptoms. Treatment may include:  Taking medicines that block allergy symptoms, such as antihistamines. Medicine may be given as a shot, nasal spray, or pill.  Avoiding the allergen.  Desensitization. This treatment involves getting ongoing shots until your body becomes less sensitive to the allergen. This treatment may be done if other treatments do not help.  If taking medicine and avoiding the allergen does not work, new,  stronger medicines may be prescribed.  Follow these instructions at home:  Find out what you are allergic to. Common allergens include smoke, dust, and pollen.  Avoid the things you are allergic to. These are some things you can do to help avoid allergens: ? Replace carpet with wood, tile, or vinyl flooring. Carpet can trap dander and dust. ? Do not smoke. Do not allow smoking in your home. ? Change your heating and air conditioning filter at least once a month. ? During allergy season:  Keep windows closed as much as possible.  Plan outdoor activities when pollen counts are lowest. This is usually during the evening hours.  When coming indoors, change clothing and shower before sitting on furniture or bedding.  Take over-the-counter and prescription medicines only as told by your health care provider.  Keep all follow-up visits as told by your health care provider. This is important. Contact a health care provider if:  You have a fever.  You develop a persistent cough.  You make whistling sounds when you breathe (you wheeze).  Your symptoms interfere with your normal daily activities. Get help right away if:  You have shortness of breath. Summary  This condition can be managed by taking medicines as directed and avoiding allergens.  Contact your health care provider if you develop a persistent cough or fever.  During allergy season, keep windows closed as much as possible. This information is not intended to replace advice given to you by your health care provider. Make sure you discuss any questions you have with your health care provider. Document Released: 03/21/2001 Document Revised: 08/03/2016  Document Reviewed: 08/03/2016 Elsevier Interactive Patient Education  2018 [employer]. Viral Respiratory Infection A respiratory infection is an illness that affects part of the respiratory system, such as the lungs, nose, or throat. Most respiratory infections are caused by  either viruses or bacteria. A respiratory infection that is caused by a virus is called a viral respiratory infection. Common types of viral respiratory infections include:  A cold.  The flu (influenza).  A respiratory syncytial virus (RSV) infection.  How do I know if I have a viral respiratory infection? Most viral respiratory infections cause:  A stuffy or runny nose.  Yellow or green nasal discharge.  A cough.  Sneezing.  Fatigue.  Achy muscles.  A sore throat.  Sweating or chills.  A fever.  A headache.  How are viral respiratory infections treated? If influenza is diagnosed early, it may be treated with an antiviral medicine that shortens the length of time a person has symptoms. Symptoms of viral respiratory infections may be treated with over-the-counter and prescription medicines, such as:  Expectorants. These make it easier to cough up mucus.  Decongestant nasal sprays.  Health care providers do not prescribe antibiotic medicines for viral infections. This is because antibiotics are designed to kill bacteria. They have no effect on viruses. How do I know if I should stay home from work or school? To avoid exposing others to your respiratory infection, stay home if you have:  A fever.  A persistent cough.  A sore throat.  A runny nose.  Sneezing.  Muscles aches.  Headaches.  Fatigue.  Weakness.  Chills.  Sweating.  Nausea.  Follow these instructions at home:  Rest as much as possible.  Take over-the-counter and prescription medicines only as told by your health care provider.  Drink enough fluid to keep your urine clear or pale yellow. This helps prevent dehydration and helps loosen up mucus.  Gargle with a salt-water mixture 3-4 times per day or as needed. To make a salt-water mixture, completely dissolve -1 tsp of salt in 1 cup of warm water.  Use nose drops made from salt water to ease congestion and soften raw skin around your  nose.  Do not drink alcohol.  Do not use tobacco products, including cigarettes, chewing tobacco, and e-cigarettes. If you need help quitting, ask your health care provider. Contact a health care provider if:  Your symptoms last for 10 days or longer.  Your symptoms get worse over time.  You have a fever.  You have severe sinus pain in your face or forehead.  The glands in your jaw or neck become very swollen. Get help right away if:  You feel pain or pressure in your chest.  You have shortness of breath.  You faint or feel like you will faint.  You have severe and persistent vomiting.  You feel confused or disoriented. This information is not intended to replace advice given to you by your health care provider. Make sure you discuss any questions you have with your health care provider. Document Released: 04/05/2005 Document Revised: 12/02/2015 Document Reviewed: 12/02/2014 Elsevier Interactive Patient Education  2018 [employer]. Sinus Rinse What is a sinus rinse? A sinus rinse is a simple home treatment that is used to rinse your sinuses with a sterile mixture of salt and water (saline solution). Sinuses are air-filled spaces in your skull behind the bones of your face and forehead that open into your nasal cavity. You will use the following:  Saline solution.  Neti pot or spray bottle. This releases the saline solution into your nose and through your sinuses. Neti pots and spray bottles can be purchased at [employer], a health food store, or online.  When would I do a sinus rinse? A sinus rinse can help to clear mucus, dirt, dust, or pollen from the nasal cavity. You may do a sinus rinse when you have a cold, a virus, nasal allergy symptoms, a sinus infection, or stuffiness in the nose or sinuses. If you are considering a sinus rinse:  Ask your child's health care provider before performing a sinus rinse on your child.  Do not do a sinus rinse if you have  had ear or nasal surgery, ear infection, or blocked ears.  How do I do a sinus rinse?  Wash your hands.  Disinfect your device according to the directions provided and then dry it.  Use the solution that comes with your device or one that is sold separately in stores. Follow the mixing directions on the package.  Fill your device with the amount of saline solution as directed by the device instructions.  Stand over a sink and tilt your head sideways over the sink.  Place the spout of the device in your upper nostril (the one closer to the ceiling).  Gently pour or squeeze the saline solution into the nasal cavity. The liquid should drain to the lower nostril if you are not overly congested.  Gently blow your nose. Blowing too hard may cause ear pain.  Repeat in the other nostril.  Clean and rinse your device with clean water and then air-dry it. Are there risks of a sinus rinse? Sinus rinse is generally very safe and effective. However, there are a few risks, which include:  A burning sensation in the sinuses. This may happen if you do not make the saline solution as directed. Make sure to follow all directions when making the saline solution.  Infection from contaminated water. This is rare, but possible.  Nasal irritation.  This information is not intended to replace advice given to you by your health care provider. Make sure you discuss any questions you have with your health care provider. Document Released: 01/21/2014 Document Revised: 05/23/2016 Document Reviewed: 11/11/2013 Elsevier Interactive Patient Education  2017 [place]. How to Use a Metered Dose Inhaler A metered dose inhaler is a handheld device for taking medicine that must be breathed into the lungs (inhaled). The device can be used to deliver a variety of inhaled medicines, including:  Quick relief or rescue medicines, such as bronchodilators.  Controller medicines, such as corticosteroids.  The  medicine is delivered by pushing down on a metal canister to release a preset amount of spray and medicine. Each device contains the amount of medicine that is needed for a preset number of uses (inhalations). Your health care provider may recommend that you use a spacer with your inhaler to help you take the medicine more effectively. A spacer is a plastic tube with a mouthpiece on one end and an opening that connects to the inhaler on the other end. A spacer holds the medicine in a tube for a short time, which allows you to inhale more medicine. What are the risks? If you do not use your inhaler correctly, medicine might not reach your lungs to help you breathe. Inhaler medicine can cause side effects, such as:  Mouth or throat infection.  Cough.  Hoarseness.  Headache.  Nausea and vomiting.  Lung infection (  pneumonia) in people who have a lung condition called COPD.  How to use a metered dose inhaler without a spacer 1. Remove the cap from the inhaler. 2. If you are using the inhaler for the first time, shake it for 5 seconds, turn it away from your face, then release 4 puffs into the air. This is called priming. 3. Shake the inhaler for 5 seconds. 4. Position the inhaler so the top of the canister faces up. 5. Put your index finger on the top of the medicine canister. Support the bottom of the inhaler with your thumb. 6. Breathe out normally and as completely as possible, away from the inhaler. 7. Either place the inhaler between your teeth and close your lips tightly around the mouthpiece, or hold the inhaler 1-2 inches (2.5-5 cm) away from your open mouth. Keep your tongue down out of the way. If you are unsure which technique to use, ask your health care provider. 8. Press the canister down with your index finger to release the medicine, then inhale deeply and slowly through your mouth (not your nose) until your lungs are completely filled. Inhaling should take 4-6 seconds. 9. Hold  the medicine in your lungs for 5-10 seconds (10 seconds is best). This helps the medicine get into the small airways of your lungs. 10. With your lips in a tight circle (pursed), breathe out slowly. 11. Repeat steps 3-10 until you have taken the number of puffs that your health care provider directed. Wait about 1 minute between puffs or as directed. 12. Put the cap on the inhaler. 13. If you are using a steroid inhaler, rinse your mouth with water, gargle, and spit out the water. Do not swallow the water. How to use a metered dose inhaler with a spacer 1. Remove the cap from the inhaler. 2. If you are using the inhaler for the first time, shake it for 5 seconds, turn it away from your face, then release 4 puffs into the air. This is called priming. 3. Shake the inhaler for 5 seconds. 4. Place the open end of the spacer onto the inhaler mouthpiece. 5. Position the inhaler so the top of the canister faces up and the spacer mouthpiece faces you. 6. Put your index finger on the top of the medicine canister. Support the bottom of the inhaler and the spacer with your thumb. 7. Breathe out normally and as completely as possible, away from the spacer. 8. Place the spacer between your teeth and close your lips tightly around it. Keep your tongue down out of the way. 9. Press the canister down with your index finger to release the medicine, then inhale deeply and slowly through your mouth (not your nose) until your lungs are completely filled. Inhaling should take 4-6 seconds. 10. Hold the medicine in your lungs for 5-10 seconds (10 seconds is best). This helps the medicine get into the small airways of your lungs. 11. With your lips in a tight circle (pursed), breathe out slowly. 12. Repeat steps 3-11 until you have taken the number of puffs that your health care provider directed. Wait about 1 minute between puffs or as directed. 13. Remove the spacer from the inhaler and put the cap on the  inhaler. 14. If you are using a steroid inhaler, rinse your mouth with water, gargle, and spit out the water. Do not swallow the water. Follow these instructions at home:  Take your inhaled medicine only as told by your health care provider. Do  not use the inhaler more than directed by your health care provider.  Keep all follow-up visits as told by your health care provider. This is important.  If your inhaler has a counter, you can check it to determine how full your inhaler is. If your inhaler does not have a counter, ask your health care provider when you will need to refill your inhaler and write the refill date on a calendar or on your inhaler canister. Note that you cannot know when an inhaler is empty by shaking it.  Follow directions on the package insert for care and cleaning of your inhaler and spacer. Contact a health care provider if:  Symptoms are only partially relieved with your inhaler.  You are having trouble using your inhaler.  You have an increase in phlegm.  You have headaches. Get help right away if:  You feel little or no relief after using your inhaler.  You have dizziness.  You have a fast heart rate.  You have chills or a fever.  You have night sweats.  There is blood in your phlegm. Summary  A metered dose inhaler is a handheld device for taking medicine that must be breathed into the lungs (inhaled).  The medicine is delivered by pushing down on a metal canister to release a preset amount of spray and medicine.  Each device contains the amount of medicine that is needed for a preset number of uses (inhalations). This information is not intended to replace advice given to you by your health care provider. Make sure you discuss any questions you have with your health care provider. Document Released: 06/26/2005 Document Revised: 05/16/2016 Document Reviewed: 05/16/2016 Elsevier Interactive Patient Education  2017 [employer].

## 2018-03-14 NOTE — Telephone Encounter (Signed)
Called pt to try and reschedule appt with Dr. Brigitte Pulse. Left VM for pt to call the office and reschedule an appt. When pt calls back, please reschedule at their convenience for:   6 MONTH RECHECK ON HER DIABETES (RESCHEDULED FROM 03/14/2018  Thank you!

## 2018-03-23 ENCOUNTER — Other Ambulatory Visit: Payer: Self-pay | Admitting: Family Medicine

## 2018-03-25 ENCOUNTER — Ambulatory Visit: Payer: PRIVATE HEALTH INSURANCE | Admitting: Family Medicine

## 2018-03-25 NOTE — Telephone Encounter (Signed)
Venlafaxine ER 150 mg  refill Last Refill:03/065/19 # 90 and 1 refill Last OV: 01/05/18 PCP: E. Crane: [facility] # [phone]

## 2018-04-05 ENCOUNTER — Telehealth: Payer: Self-pay | Admitting: Family Medicine

## 2018-04-05 NOTE — Telephone Encounter (Signed)
Called and left VM for pt to call the office and reschedule her appt with Dr. Brigitte Pulse on 10/28. If pt calls back, please reschedule her with Dr. Brigitte Pulse at her convenience or if she needs to be seen before Dr. Brigitte Pulse can see her, please reschedule with a provider of her choice.   Thank you!

## 2018-04-29 ENCOUNTER — Ambulatory Visit: Payer: PRIVATE HEALTH INSURANCE | Admitting: Family Medicine

## 2018-04-29 ENCOUNTER — Other Ambulatory Visit: Payer: Self-pay | Admitting: Family Medicine

## 2018-04-29 ENCOUNTER — Other Ambulatory Visit: Payer: Self-pay

## 2018-04-29 ENCOUNTER — Encounter: Payer: Self-pay | Admitting: Family Medicine

## 2018-04-29 VITALS — BP 127/75 | HR 94 | Temp 98.0°F | Resp 16 | Ht 71.0 in | Wt 214.0 lb

## 2018-04-29 DIAGNOSIS — E119 Type 2 diabetes mellitus without complications: Secondary | ICD-10-CM | POA: Diagnosis not present

## 2018-04-29 DIAGNOSIS — I10 Essential (primary) hypertension: Secondary | ICD-10-CM | POA: Diagnosis not present

## 2018-04-29 DIAGNOSIS — Z23 Encounter for immunization: Secondary | ICD-10-CM | POA: Diagnosis not present

## 2018-04-29 DIAGNOSIS — J302 Other seasonal allergic rhinitis: Secondary | ICD-10-CM

## 2018-04-29 DIAGNOSIS — M25551 Pain in right hip: Secondary | ICD-10-CM

## 2018-04-29 LAB — POCT GLYCOSYLATED HEMOGLOBIN (HGB A1C): Hemoglobin A1C: 6.6 % — AB (ref 4.0–5.6)

## 2018-04-29 MED ORDER — METFORMIN HCL 1000 MG PO TABS: ORAL_TABLET | ORAL | 3 refills | Status: DC

## 2018-04-29 MED ORDER — MONTELUKAST SODIUM 10 MG PO TABS: 10.0000 mg | ORAL_TABLET | Freq: Every day | ORAL | 3 refills | Status: DC

## 2018-04-29 MED ORDER — DICLOFENAC SODIUM 75 MG PO TBEC: 75.0000 mg | DELAYED_RELEASE_TABLET | Freq: Two times a day (BID) | ORAL | 0 refills | Status: DC

## 2018-04-29 NOTE — Patient Instructions (Signed)
° ° ° °  If you have lab work done today you will be contacted with your lab results within the next 2 weeks.  If you have not heard from us then please contact us. The fastest way to get your results is to register for My Chart. ° ° °IF you received an x-ray today, you will receive an invoice from [facility]. Please contact [facility] at [phone] with questions or concerns regarding your invoice.  ° °IF you received labwork today, you will receive an invoice from [facility]. Please contact [facility] at [phone] with questions or concerns regarding your invoice.  ° °Our billing staff will not be able to assist you with questions regarding bills from these companies. ° °You will be contacted with the lab results as soon as they are available. The fastest way to get your results is to activate your My Chart account. Instructions are located on the last page of this paperwork. If you have not heard from us regarding the results in 2 weeks, please contact this office. °  ° ° ° °

## 2018-04-29 NOTE — Progress Notes (Signed)
10/21/201910:42 AM  Kelly Reed [birth year], 52 y.o. female [id number]  Chief Complaint  Patient presents with  . Diabetes    3 month check  . Hip Pain    pt states it may be muscle pain, x 1 wk    HPI:   Patient is a 52 y.o. female with past medical history significant for asthma, HTN, DM2, seasonal allergies, HLP, who presents today for routine followup  PCP Dr Brigitte Pulse Last visit June 2019  Takes all meds as prescribed Checks cbgs sporadically Denies any low cbgs  Right hip pain for about a week Has not been able to sleep on that side Pain mostly around lateral hip but goes down the side of her leg specially when she walks and put on pressure her right heel Has tried ibuprofen and biofreeze with partial relief Denies any inciting events Has very active job  Lab Results  Component Value Date   HGBA1C 6.6 09/12/2017   HGBA1C 7.1 05/10/2017   HGBA1C 6.3 (H) 11/21/2016   Lab Results  Component Value Date   GLUF 178 (A) 03/20/2012   MICROALBUR 0.3 09/16/2015   [place] 91 11/21/2016   CREATININE 0.63 09/12/2017    Fall Risk  01/05/2018 01/02/2018 12/31/2017 12/26/2017 09/14/2017  Falls in the past year? No No No No No  Number falls in past yr: - - - - -  Injury with Fall? - - - - -  Comment - - - - -     Depression screen [facility] 2/9 01/05/2018 01/02/2018 12/31/2017  Decreased Interest 0 0 0  Down, Depressed, Hopeless 0 0 0  PHQ - 2 Score 0 0 0    Allergies  Allergen Reactions  . Adhesive [Tape] Rash and Other (See Comments)    Peeling of skin  . Prednisone     Hyperglycemia with oral steroids    Prior to Admission medications   Medication Sig Start Date End Date Taking? Authorizing Provider  albuterol (VENTOLIN HFA) 108 (90 Base) MCG/ACT inhaler INHALE 2 PUFFS BY MOUTH INTO THE LUNGS EVERY 6 HOURS AS NEEDED WHEEZING OR SHORTNESS OF BREATH 12/31/17  Yes Shawnee Knapp, MD  aspirin EC 81 MG tablet Take 1 tablet (81 mg total) by mouth daily. 08/07/14  Yes Shawnee Knapp, MD   atorvastatin (LIPITOR) 10 MG tablet TAKE 1 TABLET(10 MG) BY MOUTH DAILY 11/21/16  Yes Shawnee Knapp, MD  Azelastine-Fluticasone [facility]) 137-50 MCG/ACT SUSP Place into the nose.   Yes [provider]  BREO ELLIPTA 200-25 MCG/INH AEPB INL 1 PUFF PO QD 11/24/17  Yes [provider]  cetirizine (ZYRTEC) 10 MG tablet Take 5 mg by mouth daily.    Yes [provider]  Epinastine HCl 0.05 % ophthalmic solution Place 1 drop into both eyes 2 (two) times daily. 09/21/16  Yes Shawnee Knapp, MD  ferrous sulfate 325 (65 FE) MG tablet Take 325 mg by mouth every other day.    Yes [provider]  levocetirizine (XYZAL) 5 MG tablet Take 1 tablet by mouth every evening.  03/08/17  Yes [provider]  losartan (COZAAR) 25 MG tablet TAKE 1 TABLET(25 MG) BY MOUTH DAILY 02/15/18  Yes Shawnee Knapp, MD  MAGNESIUM PO Take by mouth.   Yes [provider]  metFORMIN (GLUCOPHAGE) 1000 MG tablet TAKE 1 TABLET(1000 MG) BY MOUTH TWICE DAILY WITH A MEAL 06/21/17  Yes Shawnee Knapp, MD  montelukast (SINGULAIR) 10 MG tablet Take 1 tablet (10 mg  total) by mouth at bedtime. 03/14/18  Yes Betancourt, Aura Fey, NP  Multiple Vitamins-Minerals (CENTRUM ADULTS PO) Take by mouth.   Yes [provider]  venlafaxine XR (EFFEXOR-XR) 150 MG 24 hr capsule TAKE 1 CAPSULE BY MOUTH DAILY WITH BREAKFAST 03/25/18  Yes Shawnee Knapp, MD  vitamin C (ASCORBIC ACID) 500 MG tablet Take 500 mg by mouth daily.   Yes [provider]  sodium chloride (OCEAN) 0.65 % SOLN nasal spray Place 2 sprays into both nostrils every 2 (two) hours while awake. 02/19/18 03/21/18  BetancourtAura Fey, NP    Past Medical History:  Diagnosis Date  . Anemia   . Anxiety   . Asthma   . Diabetes mellitus type II   . Hyperlipidemia   . Hypertension   . NSVD (normal spontaneous vaginal delivery)    x 2    Past Surgical History:  Procedure Laterality Date  . 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  History of Abd. ultrasound  12/04   negative  . Surg. eval lap cholecstectomy  05/28/2006   deferred by patient  . TUBAL LIGATION  1993   bilateral    Social History   Tobacco Use  . Smoking status: Never Smoker  . Smokeless tobacco: Never Used  Substance Use Topics  . Alcohol use: Yes    Alcohol/week: 1.0 - 2.0 standard drinks    Types: 1 - 2 Standard drinks or equivalent per week    Comment: Occasional beer    Family History  Problem Relation Age of Onset  . Breast cancer Mother 65       pt's mother passed away from breast cancer when pt was 52 yo  . Diabetes Maternal Grandmother     Review of Systems  Constitutional: Negative for chills and fever.  Respiratory: Negative for cough and shortness of breath.   Cardiovascular: Negative for chest pain, palpitations and leg swelling.  Gastrointestinal: Negative for abdominal pain, nausea and vomiting.  Musculoskeletal: Positive for joint pain. Negative for back pain.     OBJECTIVE:  Blood pressure 127/75, pulse 94, temperature 98 F (36.7 C), temperature source Oral, resp. rate 16, height 5\' 11"  (1.803 m), weight 214 lb (97.1 kg), SpO2 98 %. Body mass index is 29.85 kg/m.   Physical Exam  Constitutional: She is oriented to person, place, and time. She appears well-developed and well-nourished.  HENT:  Head: Normocephalic and atraumatic.  Mouth/Throat: Oropharynx is clear and moist. No oropharyngeal exudate.  Eyes: Pupils are equal, round, and reactive to light. Conjunctivae and EOM are normal. No scleral icterus.  Neck: Neck supple.  Cardiovascular: Normal rate, regular rhythm and normal heart sounds. Exam reveals no gallop and no friction rub.  No murmur heard. Pulmonary/Chest: Effort normal and breath sounds normal. She has no wheezes. She has no rales.  Musculoskeletal: She exhibits no edema.       Right hip: She exhibits tenderness (along GM tendon and IT band) and bony tenderness (over GT bursa). She exhibits  normal range of motion, normal strength and no swelling.       Left hip: Normal.       Lumbar back: Normal.  Neurological: She is alert and oriented to person, place, and time.  Skin: Skin is warm and dry.  Psychiatric: She has a normal mood and affect.  Nursing note and vitals reviewed.   Diabetic Foot Exam - Simple   Simple Foot Form Visual Inspection No deformities, no ulcerations, no  other skin breakdown bilaterally:  Yes Sensation Testing Intact to touch and monofilament testing bilaterally:  Yes Pulse Check Comments Felt all pricks with the filament, no broken skin on either foot     Results for orders placed or performed in visit on 04/29/18 (from the past 24 hour(s))  POCT glycosylated hemoglobin (Hb A1C)     Status: Abnormal   Collection Time: 04/29/18 11:05 AM  Result Value Ref Range   Hemoglobin A1C 6.6 (A) 4.0 - 5.6 %   HbA1c POC (<> result, manual entry)     HbA1c, POC (prediabetic range)     HbA1c, POC (controlled diabetic range)        ASSESSMENT and PLAN  1. Right hip pain Discussed supportive measures, new meds r/se/b and RTC precautions. Patient educational handout given.  2. Type 2 diabetes mellitus without complication, without long-term current use of insulin (HCC) Controlled. Continue current regime.  - POCT glycosylated hemoglobin (Hb A1C) - HM DIABETES FOOT EXAM  3. Essential hypertension, benign Controlled. Continue current regime.   4. Need for influenza vaccination - Flu Vaccine QUAD 36+ mos IM  5. Other seasonal allergic rhinitis - montelukast (SINGULAIR) 10 MG tablet; Take 1 tablet (10 mg total) by mouth at bedtime.  Other orders - diclofenac (VOLTAREN) 75 MG EC tablet; Take 1 tablet (75 mg total) by mouth 2 (two) times daily. - metFORMIN (GLUCOPHAGE) 1000 MG tablet; TAKE 1 TABLET(1000 MG) BY MOUTH TWICE DAILY WITH A MEAL   Return in about 6 months (around 10/29/2018) for DM2 and HTN.    Rutherford Guys, MD Primary Care at  [place], [place] [address] Ph.  [phone] Fax [phone]

## 2018-04-30 NOTE — Telephone Encounter (Signed)
Requested Prescriptions  Pending Prescriptions Disp Refills  . diclofenac (VOLTAREN) 75 MG EC tablet [Pharmacy Med Name: DICLOFENAC SODIUM 75MG  DR TABLETS] 180 tablet 0    Sig: TAKE 1 TABLET(75 MG) BY MOUTH TWICE DAILY     Analgesics:  NSAIDS Passed - 04/29/2018 11:39 AM      Passed - Cr in normal range and within 360 days    Creat  Date Value Ref Range Status  03/23/2016 0.60 0.50 - 1.05 mg/dL Final    Comment:      For patients > or = 54 years of age: The upper reference limit for Creatinine is approximately 13% higher for people identified as African-American.      Creatinine, Ser  Date Value Ref Range Status  09/12/2017 0.63 0.57 - 1.00 mg/dL Final         Passed - HGB in normal range and within 360 days    Hemoglobin  Date Value Ref Range Status  07/24/2017 14.4 12.2 - 16.2 g/dL Final  11/21/2016 13.8 11.1 - 15.9 g/dL Final         Passed - Patient is not pregnant      Passed - Valid encounter within last 12 months    Recent Outpatient Visits          Yesterday Right hip pain   [facility], Lilia Argue, MD   3 months ago Moderate persistent asthma with acute exacerbation   [facility], Laurey Arrow, MD   3 months ago Chest tightness   [facility] at [place], PA-C   4 months ago Acute recurrent ethmoidal sinusitis   [facility], Laurey Arrow, MD   4 months ago Chest tightness   [facility], Laurey Arrow, MD      Future Appointments            In 6 months Brigitte Pulse Laurey Arrow, MD Primary Care at [place], [facility]

## 2018-05-06 ENCOUNTER — Ambulatory Visit: Payer: PRIVATE HEALTH INSURANCE | Admitting: Family Medicine

## 2018-05-19 ENCOUNTER — Other Ambulatory Visit: Payer: Self-pay | Admitting: Family Medicine

## 2018-05-20 NOTE — Telephone Encounter (Signed)
Requested Prescriptions  Pending Prescriptions Disp Refills  . losartan (COZAAR) 25 MG tablet [Pharmacy Med Name: LOSARTAN 25MG  TABLETS] 90 tablet 1    Sig: TAKE 1 TABLET(25 MG) BY MOUTH DAILY     Cardiovascular:  Angiotensin Receptor Blockers Failed - 05/19/2018  5:40 PM      Failed - Cr in normal range and within 180 days    Creat  Date Value Ref Range Status  03/23/2016 0.60 0.50 - 1.05 mg/dL Final    Comment:      For patients > or = 55 years of age: The upper reference limit for Creatinine is approximately 13% higher for people identified as African-American.      Creatinine, Ser  Date Value Ref Range Status  09/12/2017 0.63 0.57 - 1.00 mg/dL Final         Failed - K in normal range and within 180 days    Potassium  Date Value Ref Range Status  09/12/2017 4.0 3.5 - 5.2 mmol/L Final         Passed - Patient is not pregnant      Passed - Last BP in normal range    BP Readings from Last 1 Encounters:  04/29/18 127/75         Passed - Valid encounter within last 6 months    Recent Outpatient Visits          3 weeks ago Right hip pain   [facility], Lilia Argue, MD   4 months ago Moderate persistent asthma with acute exacerbation   [facility], Laurey Arrow, MD   4 months ago Chest tightness   [facility] at [place], PA-C   4 months ago Acute recurrent ethmoidal sinusitis   [facility], Laurey Arrow, MD   4 months ago Chest tightness   [facility], Laurey Arrow, MD      Future Appointments            In 5 months Brigitte Pulse Laurey Arrow, MD Primary Care at [place], [facility]

## 2018-06-13 NOTE — Progress Notes (Signed)
Acute Office Visit  Subjective:    Patient ID: Kelly Reed, female    DOB: [birth year], 52 y.o.   MRN: [id number]  Chief Complaint  Patient presents with  . fever, chills, cough, bodyaches, cough , diarrhea, fatigued     onset: yesterday, per pt throat not sore just hard to swallow.  Pt has hx of asthma and allergies.  Otc meds, tylenol, robitussin and hydrococone/chlorphen Er for cough    HPI   This is a patient of Dr. Brigitte Pulse who is known to the practice who presents for flu-like symptoms. Fever 100.1 orally with body aches, diarrhea, cough, sore throat, chills, sinus pressure  Reports sick contacts at work  Onset 24 hours  Medications tried includes tylenol, [place] DM, hydrocodone cough syrup left over from earlier in the ear Co-morbid conditions: diabetes mellitus, hypertension, asthma and allergies   Asthma and Allergies She reports that she used her rescue inhaler once yesterday and continue to use her Breo every morning as instructed.  She does not feel like it has aggravated her asthma Her usual asthma triggers are dust and pollen She feels like her asthma is well controlled with Breo. She gets allergy shots once a week which was helping with her allergy shots.  Past Medical History:  Diagnosis Date  . Anemia   . Anxiety   . Asthma   . Diabetes mellitus type II   . Hyperlipidemia   . Hypertension   . NSVD (normal spontaneous vaginal delivery)    x 2    Past Surgical History:  Procedure Laterality Date  . 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 History of Abd. ultrasound  12/04   negative  . Surg. eval lap cholecstectomy  05/28/2006   deferred by patient  . TUBAL LIGATION  1993   bilateral    Family History  Problem Relation Age of Onset  . Breast cancer Mother 10       pt's mother passed away from breast cancer when pt was 52 yo  . Diabetes Maternal Grandmother     Social History   Socioeconomic History  . Marital status: Married   Spouse name: Not on file  . Number of children: 2  . Years of education: Not on file  . Highest education level: Not on file  Occupational History  . Occupation: Retail buyer.    Employer: [employer]  Social Needs  . Financial resource strain: Not on file  . Food insecurity:    Worry: Not on file    Inability: Not on file  . Transportation needs:    Medical: Not on file    Non-medical: Not on file  Tobacco Use  . Smoking status: Never Smoker  . Smokeless tobacco: Never Used  Substance and Sexual Activity  . Alcohol use: Yes    Alcohol/week: 1.0 - 2.0 standard drinks    Types: 1 - 2 Standard drinks or equivalent per week    Comment: Occasional beer  . Drug use: No  . Sexual activity: Never    Partners: Male    Birth control/protection: Surgical  Lifestyle  . Physical activity:    Days per week: Not on file    Minutes per session: Not on file  . Stress: Not on file  Relationships  . Social connections:    Talks on phone: Not on file    Gets together: Not on file    Attends religious service: Not on file    Active  member of club or organization: Not on file    Attends meetings of clubs or organizations: Not on file    Relationship status: Not on file  . Intimate partner violence:    Fear of current or ex partner: Not on file    Emotionally abused: Not on file    Physically abused: Not on file    Forced sexual activity: Not on file  Other Topics Concern  . Not on file  Social History Narrative   2 Sons   Works at [employer]   Married    Outpatient Medications Prior to Visit  Medication Sig Dispense Refill  . albuterol (VENTOLIN HFA) 108 (90 Base) MCG/ACT inhaler INHALE 2 PUFFS BY MOUTH INTO THE LUNGS EVERY 6 HOURS AS NEEDED WHEEZING OR SHORTNESS OF BREATH 18 g 5  . aspirin EC 81 MG tablet Take 1 tablet (81 mg total) by mouth daily.    Marland Kitchen atorvastatin (LIPITOR) 10 MG tablet TAKE 1 TABLET(10 MG) BY MOUTH DAILY 90 tablet 1  . Azelastine-Fluticasone  (DYMISTA) 137-50 MCG/ACT SUSP Place into the nose.    Marland Kitchen BREO ELLIPTA 200-25 MCG/INH AEPB INL 1 PUFF PO QD  2  . cetirizine (ZYRTEC) 10 MG tablet Take 5 mg by mouth daily.     . diclofenac (VOLTAREN) 75 MG EC tablet TAKE 1 TABLET(75 MG) BY MOUTH TWICE DAILY 180 tablet 0  . Epinastine HCl 0.05 % ophthalmic solution Place 1 drop into both eyes 2 (two) times daily. 5 mL 2  . ferrous sulfate 325 (65 FE) MG tablet Take 325 mg by mouth every other day.     . levocetirizine (XYZAL) 5 MG tablet Take 1 tablet by mouth every evening.   9  . losartan (COZAAR) 25 MG tablet TAKE 1 TABLET(25 MG) BY MOUTH DAILY 90 tablet 1  . MAGNESIUM PO Take by mouth.    . metFORMIN (GLUCOPHAGE) 1000 MG tablet TAKE 1 TABLET(1000 MG) BY MOUTH TWICE DAILY WITH A MEAL 180 tablet 3  . montelukast (SINGULAIR) 10 MG tablet Take 1 tablet (10 mg total) by mouth at bedtime. 90 tablet 3  . Multiple Vitamins-Minerals (CENTRUM ADULTS PO) Take by mouth.    . venlafaxine XR (EFFEXOR-XR) 150 MG 24 hr capsule TAKE 1 CAPSULE BY MOUTH DAILY WITH BREAKFAST 90 capsule 0  . vitamin C (ASCORBIC ACID) 500 MG tablet Take 500 mg by mouth daily.    . sodium chloride (OCEAN) 0.65 % SOLN nasal spray Place 2 sprays into both nostrils every 2 (two) hours while awake.  0   No facility-administered medications prior to visit.     Allergies  Allergen Reactions  . Adhesive [Tape] Rash and Other (See Comments)    Peeling of skin  . Prednisone     Hyperglycemia with oral steroids    ROS Review of Systems  Constitutional: Negative for activity change, appetite change HENT: Negative for congestion, nosebleeds, trouble swallowing and voice change.   Respiratory: see hpi Gastrointestinal: Negative for diarrhea, nausea and vomiting.  Genitourinary: Negative for difficulty urinating, dysuria, flank pain and hematuria.  Musculoskeletal: Negative for back pain, joint swelling and neck pain.  Neurological: Negative for dizziness, speech difficulty,  light-headedness and numbness.  See HPI. All other review of systems negative.      Objective:     BP (!) 145/84 (BP Location: Right Arm, Patient Position: Sitting, Cuff Size: Large)   Pulse 81   Temp 98.5 F (36.9 C) (Oral)   Resp 17   Ht  5\' 11"  (1.803 m)   Wt 215 lb 9.6 oz (97.8 kg)   SpO2 97%   BMI 30.07 kg/m  Wt Readings from Last 3 Encounters:  06/14/18 215 lb 9.6 oz (97.8 kg)  04/29/18 214 lb (97.1 kg)  01/05/18 212 lb (96.2 kg)    Physical Exam General: alert, oriented, in NAD Head: normocephalic, atraumatic, no sinus tenderness Eyes: EOM intact, no scleral icterus or conjunctival injection Ears: TM clear bilaterally Nose: mucosa nonerythematous, nonedematous Throat: no pharyngeal exudate or erythema Lymph: no posterior auricular, submental or cervical lymph adenopathy Heart: normal rate, normal sinus rhythm, no murmurs Lungs: clear to auscultation bilaterally, no wheezing    There are no preventive care reminders to display for this patient.  There are no preventive care reminders to display for this patient.   Lab Results  Component Value Date   TSH 0.972 11/21/2016   Lab Results  Component Value Date   WBC 10.8 (A) 07/24/2017   HGB 14.4 07/24/2017   HCT 42.4 07/24/2017   MCV 88.9 07/24/2017   PLT 232 11/21/2016   Lab Results  Component Value Date   NA 142 09/12/2017   K 4.0 09/12/2017   CO2 24 09/12/2017   GLUCOSE 104 (H) 09/12/2017   BUN 13 09/12/2017   CREATININE 0.63 09/12/2017   BILITOT <0.2 09/12/2017   ALKPHOS 75 09/12/2017   AST 20 09/12/2017   ALT 26 09/12/2017   PROT 7.3 09/12/2017   ALBUMIN 4.8 09/12/2017   CALCIUM 9.5 09/12/2017   GFR 116.26 01/30/2011   Lab Results  Component Value Date   CHOL 186 11/21/2016   Lab Results  Component Value Date   HDL 70 11/21/2016   Lab Results  Component Value Date   LDLCALC 91 11/21/2016   Lab Results  Component Value Date   TRIG 123 11/21/2016   Lab Results  Component Value  Date   CHOLHDL 2.7 11/21/2016   Lab Results  Component Value Date   HGBA1C 6.6 (A) 04/29/2018       Assessment & Plan:   Problem List Items Addressed This Visit      Respiratory   Asthma    Discussed asthma attack prevention, advised pt to use albuterol for cough symptom. Checked flu vaccine and pt received. Flu swab negative.       Acute URI - Primary    Discussed viral etiology Discussed otc decongestant Advised flonase for congestion  Increase hydration Vitamin C and zinc lozenges suggested Return to clinic if symptoms worse         Endocrine   Diabetes mellitus, type II ([place])    Diabetes well controlled.  Discussed that acute infection can lead to hyperglycemia. Continue current medications and increase hydration.         Other   Flu-like symptoms   Relevant Orders   POC Influenza A&B(BINAX/QUICKVUE) (Completed)       No orders of the defined types were placed in this encounter.    Forrest Moron, MD

## 2018-06-14 ENCOUNTER — Encounter: Payer: Self-pay | Admitting: Family Medicine

## 2018-06-14 ENCOUNTER — Other Ambulatory Visit: Payer: Self-pay

## 2018-06-14 ENCOUNTER — Ambulatory Visit: Payer: PRIVATE HEALTH INSURANCE | Admitting: Family Medicine

## 2018-06-14 VITALS — BP 145/84 | HR 81 | Temp 98.5°F | Resp 17 | Ht 71.0 in | Wt 215.6 lb

## 2018-06-14 DIAGNOSIS — R6889 Other general symptoms and signs: Secondary | ICD-10-CM | POA: Diagnosis not present

## 2018-06-14 DIAGNOSIS — J4520 Mild intermittent asthma, uncomplicated: Secondary | ICD-10-CM

## 2018-06-14 DIAGNOSIS — E119 Type 2 diabetes mellitus without complications: Secondary | ICD-10-CM

## 2018-06-14 DIAGNOSIS — J069 Acute upper respiratory infection, unspecified: Secondary | ICD-10-CM | POA: Insufficient documentation

## 2018-06-14 LAB — POC INFLUENZA A&B (BINAX/QUICKVUE)
INFLUENZA A, POC: NEGATIVE
Influenza B, POC: NEGATIVE

## 2018-06-14 MED ORDER — HYDROCODONE-HOMATROPINE 5-1.5 MG/5ML PO SYRP: 5.0000 mL | ORAL_SOLUTION | Freq: Three times a day (TID) | ORAL | 0 refills | Status: DC | PRN

## 2018-06-14 NOTE — Assessment & Plan Note (Signed)
Discussed asthma attack prevention, advised pt to use albuterol for cough symptom. Checked flu vaccine and pt received. Flu swab negative.

## 2018-06-14 NOTE — Addendum Note (Signed)
Addended by: Delia Chimes A on: 06/14/2018 10:36 AM   Modules accepted: Orders

## 2018-06-14 NOTE — Assessment & Plan Note (Signed)
Discussed viral etiology Discussed otc decongestant Advised flonase for congestion  Increase hydration Vitamin C and zinc lozenges suggested Return to clinic if symptoms worse  

## 2018-06-14 NOTE — Assessment & Plan Note (Signed)
Diabetes well controlled.  Discussed that acute infection can lead to hyperglycemia. Continue current medications and increase hydration.

## 2018-06-14 NOTE — Patient Instructions (Addendum)
   If you have lab work done today you will be contacted with your lab results within the next 2 weeks.  If you have not heard from us then please contact us. The fastest way to get your results is to register for My Chart.   IF you received an x-ray today, you will receive an invoice from [facility]. Please contact [facility] at [phone] with questions or concerns regarding your invoice.   IF you received labwork today, you will receive an invoice from [facility]. Please contact [facility] at [phone] with questions or concerns regarding your invoice.   Our billing staff will not be able to assist you with questions regarding bills from these companies.  You will be contacted with the lab results as soon as they are available. The fastest way to get your results is to activate your My Chart account. Instructions are located on the last page of this paperwork. If you have not heard from us regarding the results in 2 weeks, please contact this office.     Viral Respiratory Infection A respiratory infection is an illness that affects part of the respiratory system, such as the lungs, nose, or throat. Most respiratory infections are caused by either viruses or bacteria. A respiratory infection that is caused by a virus is called a viral respiratory infection. Common types of viral respiratory infections include:  A cold.  The flu (influenza).  A respiratory syncytial virus (RSV) infection.  How do I know if I have a viral respiratory infection? Most viral respiratory infections cause:  A stuffy or runny nose.  Yellow or green nasal discharge.  A cough.  Sneezing.  Fatigue.  Achy muscles.  A sore throat.  Sweating or chills.  A fever.  A headache.  How are viral respiratory infections treated? If influenza is diagnosed early, it may be treated with an antiviral medicine that shortens the length of time a person has symptoms. Symptoms of viral  respiratory infections may be treated with over-the-counter and prescription medicines, such as:  Expectorants. These make it easier to cough up mucus.  Decongestant nasal sprays.  Health care providers do not prescribe antibiotic medicines for viral infections. This is because antibiotics are designed to kill bacteria. They have no effect on viruses. How do I know if I should stay home from work or school? To avoid exposing others to your respiratory infection, stay home if you have:  A fever.  A persistent cough.  A sore throat.  A runny nose.  Sneezing.  Muscles aches.  Headaches.  Fatigue.  Weakness.  Chills.  Sweating.  Nausea.  Follow these instructions at home:  Rest as much as possible.  Take over-the-counter and prescription medicines only as told by your health care provider.  Drink enough fluid to keep your urine clear or pale yellow. This helps prevent dehydration and helps loosen up mucus.  Gargle with a salt-water mixture 3-4 times per day or as needed. To make a salt-water mixture, completely dissolve -1 tsp of salt in 1 cup of warm water.  Use nose drops made from salt water to ease congestion and soften raw skin around your nose.  Do not drink alcohol.  Do not use tobacco products, including cigarettes, chewing tobacco, and e-cigarettes. If you need help quitting, ask your health care provider. Contact a health care provider if:  Your symptoms last for 10 days or longer.  Your symptoms get worse over time.  You have a fever.  You   have severe sinus pain in your face or forehead.  The glands in your jaw or neck become very swollen. Get help right away if:  You feel pain or pressure in your chest.  You have shortness of breath.  You faint or feel like you will faint.  You have severe and persistent vomiting.  You feel confused or disoriented. This information is not intended to replace advice given to you by your health care  provider. Make sure you discuss any questions you have with your health care provider. Document Released: 04/05/2005 Document Revised: 12/02/2015 Document Reviewed: 12/02/2014 [employer] Interactive Patient Education  2018 [employer]  

## 2018-06-18 ENCOUNTER — Ambulatory Visit: Payer: Self-pay | Admitting: Registered Nurse

## 2018-06-18 ENCOUNTER — Encounter: Payer: Self-pay | Admitting: Registered Nurse

## 2018-06-18 VITALS — BP 145/82 | HR 85 | Temp 98.7°F

## 2018-06-18 DIAGNOSIS — J019 Acute sinusitis, unspecified: Secondary | ICD-10-CM

## 2018-06-18 DIAGNOSIS — K1379 Other lesions of oral mucosa: Secondary | ICD-10-CM

## 2018-06-18 MED ORDER — CLOTRIMAZOLE 10 MG MT TROC: 10.0000 mg | Freq: Every day | OROMUCOSAL | 0 refills | Status: AC

## 2018-06-18 MED ORDER — SALINE SPRAY 0.65 % NA SOLN: 2.0000 | NASAL | 0 refills | Status: DC

## 2018-06-18 MED ORDER — PREDNISONE 10 MG PO TABS: 40.0000 mg | ORAL_TABLET | Freq: Every day | ORAL | 0 refills | Status: AC

## 2018-06-18 NOTE — Patient Instructions (Addendum)
Oral Thrush, Adult Oral thrush, also called oral candidiasis, is a fungal infection that develops in the mouth and throat and on the tongue. It causes white patches to form on the mouth and tongue. Thrush is most common in older adults, but it can occur at any age. Many cases of thrush are mild, but this infection can also be serious. Thrush can be a repeated (recurrent) problem for certain people who have a weak body defense system (immune system). The weakness can be caused by chronic illnesses, or by taking medicines that limit the body's ability to fight infection. If a person has difficulty fighting infection, the fungus that causes thrush can spread through the body. This can cause life-threatening blood or organ infections. What are the causes? This condition is caused by a fungus (yeast) called Candida albicans.  This fungus is normally present in small amounts in the mouth and on other mucous membranes. It usually causes no harm.  If conditions are present that allow the fungus to grow without control, it invades surrounding tissues and becomes an infection.  Other Candida species can also lead to thrush (rare).  What increases the risk? This condition is more likely to develop in:  People with a weakened immune system.  Older adults.  People with HIV (human immunodeficiency virus).  People with diabetes.  People with dry mouth (xerostomia).  Pregnant women.  People with poor dental care, especially people who have false teeth.  People who use antibiotic medicines.  What are the signs or symptoms? Symptoms of this condition can vary from mild and moderate to severe and persistent. Symptoms may include:  A burning feeling in the mouth and throat. This can occur at the start of a thrush infection.  White patches that stick to the mouth and tongue. The tissue around the patches may be red, raw, and painful. If rubbed (during tooth brushing, for example), the patches and the  tissue of the mouth may bleed easily.  A bad taste in the mouth or difficulty tasting foods.  A cottony feeling in the mouth.  Pain during eating and swallowing.  Poor appetite.  Cracking at the corners of the mouth.  How is this diagnosed? This condition is diagnosed based on:  Physical exam. Your health care provider will look in your mouth.  Health history. Your health care provider will ask you questions about your health.  How is this treated? This condition is treated with medicines called antifungals, which prevent the growth of fungi. These medicines are either applied directly to the affected area (topical) or swallowed (oral). The treatment will depend on the severity of the condition. Mild thrush Mild cases of thrush may clear up with the use of an antifungal mouth rinse or lozenges. Treatment usually lasts about 14 days. Moderate to severe thrush  More severe thrush infections that have spread to the esophagus are treated with an oral antifungal medicine. A topical antifungal medicine may also be used.  For some severe infections, treatment may need to continue for more than 14 days.  Oral antifungal medicines are rarely used during pregnancy because they may be harmful to the unborn child. If you are pregnant, talk with your health care provider about options for treatment. Persistent or recurrent thrush For cases of thrush that do not go away or keep coming back:  Treatment may be needed twice as long as the symptoms last.  Treatment will include both oral and topical antifungal medicines.  People with a weakened immune   system can take an antifungal medicine on a continuous basis to prevent thrush infections.  It is important to treat conditions that make a person more likely to get thrush, such as diabetes or HIV. Follow these instructions at home: Medicines  Take over-the-counter and prescription medicines only as told by your health care provider.  Talk  with your health care provider about an over-the-counter medicine called gentian violet, which kills bacteria and fungi. Relieving soreness and discomfort To help reduce the discomfort of thrush:  Drink cold liquids such as water or iced tea.  Try flavored ice treats or frozen juices.  Eat foods that are easy to swallow, such as gelatin, ice cream, or custard.  Try drinking from a straw if the patches in your mouth are painful.  General instructions  Eat plain, unflavored yogurt as directed by your health care provider. Check the label to make sure the yogurt contains live cultures. This yogurt can help healthy bacteria to grow in the mouth and can stop the growth of the fungus that causes thrush.  If you wear dentures, remove the dentures before going to bed, brush them vigorously, and soak them in a cleaning solution as directed by your health care provider.  Rinse your mouth with a warm salt-water mixture several times a day. To make a salt-water mixture, completely dissolve 1/2-1 tsp of salt in 1 cup of warm water. Contact a health care provider if:  Your symptoms are getting worse or are not improving within 7 days of starting treatment.  You have symptoms of a spreading infection, such as white patches on the skin outside of the mouth. This information is not intended to replace advice given to you by your health care provider. Make sure you discuss any questions you have with your health care provider. Document Released: 03/21/2004 Document Revised: 03/20/2016 Document Reviewed: 03/20/2016 Elsevier Interactive Patient Education  2017 [employer] Sinusitis, Adult Sinusitis is soreness and inflammation of your sinuses. Sinuses are hollow spaces in the bones around your face. Your sinuses are located:  Around your eyes.  In the middle of your forehead.  Behind your nose.  In your cheekbones.  Your sinuses and nasal passages are lined with a stringy fluid (mucus). Mucus  normally drains out of your sinuses. When your nasal tissues become inflamed or swollen, the mucus can become trapped or blocked so air cannot flow through your sinuses. This allows bacteria, viruses, and funguses to grow, which leads to infection. Sinusitis can develop quickly and last for 7?10 days (acute) or for more than 12 weeks (chronic). Sinusitis often develops after a cold. What are the causes? This condition is caused by anything that creates swelling in the sinuses or stops mucus from draining, including:  Allergies.  Asthma.  Bacterial or viral infection.  Abnormally shaped bones between the nasal passages.  Nasal growths that contain mucus (nasal polyps).  Narrow sinus openings.  Pollutants, such as chemicals or irritants in the air.  A foreign object stuck in the nose.  A fungal infection. This is rare.  What increases the risk? The following factors may make you more likely to develop this condition:  Having allergies or asthma.  Having had a recent cold or respiratory tract infection.  Having structural deformities or blockages in your nose or sinuses.  Having a weak immune system.  Doing a lot of swimming or diving.  Overusing nasal sprays.  Smoking.  What are the signs or symptoms? The main symptoms of this condition  are pain and a feeling of pressure around the affected sinuses. Other symptoms include:  Upper toothache.  Earache.  Headache.  Bad breath.  Decreased sense of smell and taste.  A cough that may get worse at night.  Fatigue.  Fever.  Thick drainage from your nose. The drainage is often green and it may contain pus (purulent).  Stuffy nose or congestion.  Postnasal drip. This is when extra mucus collects in the throat or back of the nose.  Swelling and warmth over the affected sinuses.  Sore throat.  Sensitivity to light.  How is this diagnosed? This condition is diagnosed based on symptoms, a medical history, and a  physical exam. To find out if your condition is acute or chronic, your health care provider may:  Look in your nose for signs of nasal polyps.  Tap over the affected sinus to check for signs of infection.  View the inside of your sinuses using an imaging device that has a light attached (endoscope).  If your health care provider suspects that you have chronic sinusitis, you may also:  Be tested for allergies.  Have a sample of mucus taken from your nose (nasal culture) and checked for bacteria.  Have a mucus sample examined to see if your sinusitis is related to an allergy.  If your sinusitis does not respond to treatment and it lasts longer than 8 weeks, you may have an MRI or CT scan to check your sinuses. These scans also help to determine how severe your infection is. In rare cases, a bone biopsy may be done to rule out more serious types of fungal sinus disease. How is this treated? Treatment for sinusitis depends on the cause and whether your condition is chronic or acute. If a virus is causing your sinusitis, your symptoms will go away on their own within 10 days. You may be given medicines to relieve your symptoms, including:  Topical nasal decongestants. They shrink swollen nasal passages and let mucus drain from your sinuses.  Antihistamines. These drugs block inflammation that is triggered by allergies. This can help to ease swelling in your nose and sinuses.  Topical nasal corticosteroids. These are nasal sprays that ease inflammation and swelling in your nose and sinuses.  Nasal saline washes. These rinses can help to get rid of thick mucus in your nose.  If your condition is caused by bacteria, you will be given an antibiotic medicine. If your condition is caused by a fungus, you will be given an antifungal medicine. Surgery may be needed to correct underlying conditions, such as narrow nasal passages. Surgery may also be needed to remove polyps. Follow these instructions  at home: Medicines  Take, use, or apply over-the-counter and prescription medicines only as told by your health care provider. These may include nasal sprays.  If you were prescribed an antibiotic medicine, take it as told by your health care provider. Do not stop taking the antibiotic even if you start to feel better. Hydrate and Humidify  Drink enough water to keep your urine clear or pale yellow. Staying hydrated will help to thin your mucus.  Use a cool mist humidifier to keep the humidity level in your home above 50%.  Inhale steam for 10-15 minutes, 3-4 times a day or as told by your health care provider. You can do this in the bathroom while a hot shower is running.  Limit your exposure to cool or dry air. Rest  Rest as much as possible.  Sleep  with your head raised (elevated).  Make sure to get enough sleep each night. General instructions  Apply a warm, moist washcloth to your face 3-4 times a day or as told by your health care provider. This will help with discomfort.  Wash your hands often with soap and water to reduce your exposure to viruses and other germs. If soap and water are not available, use hand sanitizer.  Do not smoke. Avoid being around people who are smoking (secondhand smoke).  Keep all follow-up visits as told by your health care provider. This is important. Contact a health care provider if:  You have a fever.  Your symptoms get worse.  Your symptoms do not improve within 10 days. Get help right away if:  You have a severe headache.  You have persistent vomiting.  You have pain or swelling around your face or eyes.  You have vision problems.  You develop confusion.  Your neck is stiff.  You have trouble breathing. This information is not intended to replace advice given to you by your health care provider. Make sure you discuss any questions you have with your health care provider. Document Released: 06/26/2005 Document Revised:  02/20/2016 Document Reviewed: 04/21/2015 Elsevier Interactive Patient Education  2018 [employer]. Sinus Rinse What is a sinus rinse? A sinus rinse is a simple home treatment that is used to rinse your sinuses with a sterile mixture of salt and water (saline solution). Sinuses are air-filled spaces in your skull behind the bones of your face and forehead that open into your nasal cavity. You will use the following:  Saline solution.  Neti pot or spray bottle. This releases the saline solution into your nose and through your sinuses. Neti pots and spray bottles can be purchased at [employer], a health food store, or online.  When would I do a sinus rinse? A sinus rinse can help to clear mucus, dirt, dust, or pollen from the nasal cavity. You may do a sinus rinse when you have a cold, a virus, nasal allergy symptoms, a sinus infection, or stuffiness in the nose or sinuses. If you are considering a sinus rinse:  Ask your child's health care provider before performing a sinus rinse on your child.  Do not do a sinus rinse if you have had ear or nasal surgery, ear infection, or blocked ears.  How do I do a sinus rinse?  Wash your hands.  Disinfect your device according to the directions provided and then dry it.  Use the solution that comes with your device or one that is sold separately in stores. Follow the mixing directions on the package.  Fill your device with the amount of saline solution as directed by the device instructions.  Stand over a sink and tilt your head sideways over the sink.  Place the spout of the device in your upper nostril (the one closer to the ceiling).  Gently pour or squeeze the saline solution into the nasal cavity. The liquid should drain to the lower nostril if you are not overly congested.  Gently blow your nose. Blowing too hard may cause ear pain.  Repeat in the other nostril.  Clean and rinse your device with clean water and then air-dry  it. Are there risks of a sinus rinse? Sinus rinse is generally very safe and effective. However, there are a few risks, which include:  A burning sensation in the sinuses. This may happen if you do not make the saline solution as  directed. Make sure to follow all directions when making the saline solution.  Infection from contaminated water. This is rare, but possible.  Nasal irritation.  This information is not intended to replace advice given to you by your health care provider. Make sure you discuss any questions you have with your health care provider. Document Released: 01/21/2014 Document Revised: 05/23/2016 Document Reviewed: 11/11/2013 Elsevier Interactive Patient Education  2017 [employer].  Viral Respiratory Infection A respiratory infection is an illness that affects part of the respiratory system, such as the lungs, nose, or throat. Most respiratory infections are caused by either viruses or bacteria. A respiratory infection that is caused by a virus is called a viral respiratory infection. Common types of viral respiratory infections include:  A cold.  The flu (influenza).  A respiratory syncytial virus (RSV) infection.  How do I know if I have a viral respiratory infection? Most viral respiratory infections cause:  A stuffy or runny nose.  Yellow or green nasal discharge.  A cough.  Sneezing.  Fatigue.  Achy muscles.  A sore throat.  Sweating or chills.  A fever.  A headache.  How are viral respiratory infections treated? If influenza is diagnosed early, it may be treated with an antiviral medicine that shortens the length of time a person has symptoms. Symptoms of viral respiratory infections may be treated with over-the-counter and prescription medicines, such as:  Expectorants. These make it easier to cough up mucus.  Decongestant nasal sprays.  Health care providers do not prescribe antibiotic medicines for viral infections. This is because  antibiotics are designed to kill bacteria. They have no effect on viruses. How do I know if I should stay home from work or school? To avoid exposing others to your respiratory infection, stay home if you have:  A fever.  A persistent cough.  A sore throat.  A runny nose.  Sneezing.  Muscles aches.  Headaches.  Fatigue.  Weakness.  Chills.  Sweating.  Nausea.  Follow these instructions at home:  Rest as much as possible.  Take over-the-counter and prescription medicines only as told by your health care provider.  Drink enough fluid to keep your urine clear or pale yellow. This helps prevent dehydration and helps loosen up mucus.  Gargle with a salt-water mixture 3-4 times per day or as needed. To make a salt-water mixture, completely dissolve -1 tsp of salt in 1 cup of warm water.  Use nose drops made from salt water to ease congestion and soften raw skin around your nose.  Do not drink alcohol.  Do not use tobacco products, including cigarettes, chewing tobacco, and e-cigarettes. If you need help quitting, ask your health care provider. Contact a health care provider if:  Your symptoms last for 10 days or longer.  Your symptoms get worse over time.  You have a fever.  You have severe sinus pain in your face or forehead.  The glands in your jaw or neck become very swollen. Get help right away if:  You feel pain or pressure in your chest.  You have shortness of breath.  You faint or feel like you will faint.  You have severe and persistent vomiting.  You feel confused or disoriented. This information is not intended to replace advice given to you by your health care provider. Make sure you discuss any questions you have with your health care provider. Document Released: 04/05/2005 Document Revised: 12/02/2015 Document Reviewed: 12/02/2014 Elsevier Interactive Patient Education  2018 [employer]. Allergic  Rhinitis, Adult Allergic rhinitis is an  allergic reaction that affects the mucous membrane inside the nose. It causes sneezing, a runny or stuffy nose, and the feeling of mucus going down the back of the throat (postnasal drip). Allergic rhinitis can be mild to severe. There are two types of allergic rhinitis:  Seasonal. This type is also called hay fever. It happens only during certain seasons.  Perennial. This type can happen at any time of the year.  What are the causes? This condition happens when the body's defense system (immune system) responds to certain harmless substances called allergens as though they were germs.  Seasonal allergic rhinitis is triggered by pollen, which can come from grasses, trees, and weeds. Perennial allergic rhinitis may be caused by:  House dust mites.  Pet dander.  Mold spores.  What are the signs or symptoms? Symptoms of this condition include:  Sneezing.  Runny or stuffy nose (nasal congestion).  Postnasal drip.  Itchy nose.  Tearing of the eyes.  Trouble sleeping.  Daytime sleepiness.  How is this diagnosed? This condition may be diagnosed based on:  Your medical history.  A physical exam.  Tests to check for related conditions, such as: ? Asthma. ? Pink eye. ? Ear infection. ? Upper respiratory infection.  Tests to find out which allergens trigger your symptoms. These may include skin or blood tests.  How is this treated? There is no cure for this condition, but treatment can help control symptoms. Treatment may include:  Taking medicines that block allergy symptoms, such as antihistamines. Medicine may be given as a shot, nasal spray, or pill.  Avoiding the allergen.  Desensitization. This treatment involves getting ongoing shots until your body becomes less sensitive to the allergen. This treatment may be done if other treatments do not help.  If taking medicine and avoiding the allergen does not work, new, stronger medicines may be prescribed.  Follow  these instructions at home:  Find out what you are allergic to. Common allergens include smoke, dust, and pollen.  Avoid the things you are allergic to. These are some things you can do to help avoid allergens: ? Replace carpet with wood, tile, or vinyl flooring. Carpet can trap dander and dust. ? Do not smoke. Do not allow smoking in your home. ? Change your heating and air conditioning filter at least once a month. ? During allergy season:  Keep windows closed as much as possible.  Plan outdoor activities when pollen counts are lowest. This is usually during the evening hours.  When coming indoors, change clothing and shower before sitting on furniture or bedding.  Take over-the-counter and prescription medicines only as told by your health care provider.  Keep all follow-up visits as told by your health care provider. This is important. Contact a health care provider if:  You have a fever.  You develop a persistent cough.  You make whistling sounds when you breathe (you wheeze).  Your symptoms interfere with your normal daily activities. Get help right away if:  You have shortness of breath. Summary  This condition can be managed by taking medicines as directed and avoiding allergens.  Contact your health care provider if you develop a persistent cough or fever.  During allergy season, keep windows closed as much as possible. This information is not intended to replace advice given to you by your health care provider. Make sure you discuss any questions you have with your health care provider. Document Released: 03/21/2001 Document Revised: 08/03/2016  Document Reviewed: 08/03/2016 Elsevier Interactive Patient Education  2018 [employer].  Nonallergic Rhinitis Nonallergic rhinitis is a condition that causes symptoms that affect the nose, such as a runny nose and a stuffed-up nose (nasal congestion) that can make it hard to breathe through the nose. This condition is  different from having an allergy (allergic rhinitis). Allergic rhinitis occurs when the body's defense system (immune system) reacts to a substance that you are allergic to (allergen), such as pollen, pet dander, mold, or dust. Nonallergic rhinitis has many similar symptoms, but it is not caused by allergens. Nonallergic rhinitis can be a short-term or long-term problem. What are the causes? This condition can be caused by many different things. Some common types of nonallergic rhinitis include: Infectious rhinitis  This is usually due to an infection in the upper respiratory tract. Vasomotor rhinitis  This is the most common type of long-term nonallergic rhinitis.  It is caused by too much blood flow through the nose, which makes the tissue inside of the nose swell.  Symptoms are often triggered by strong odors, cold air, stress, drinking alcohol, cigarette smoke, or changes in the weather. Occupational rhinitis  This type is caused by triggers in the workplace, such as chemicals, dusts, animal dander, or air pollution. Hormonal rhinitis  This type occurs in women as a result of an increase in the female hormone estrogen.  It may occur during pregnancy, puberty, and menstrual cycles.  Symptoms improve when estrogen levels drop. Drug-induced rhinitis Several drugs can cause nonallergic rhinitis, including:  Medicines that are used to treat high blood pressure, heart disease, and Parkinson disease.  Aspirin and NSAIDs.  Over-the-counter nasal decongestant sprays. These can cause a type of nonallergic rhinitis (rhinitis medicamentosa) when they are used for more than a few days.  Nonallergic rhinitis with eosinophilia syndrome (NARES)  This type is caused by having too much of a certain type of white blood cell (eosinophil). Nonallergic rhinitis can also be caused by a reaction to eating hot or spicy foods. This does not usually cause long-term symptoms. In some cases, the cause of  nonallergic rhinitis is not known. What increases the risk? You are more likely to develop this condition if:  You are 53-58 years of age.  You are a woman. Women are twice as likely to have this condition.  What are the signs or symptoms? Common symptoms of this condition include:  Nasal congestion.  Runny nose.  The feeling of mucus going down the back of the throat (postnasal drip).  Trouble sleeping at night and daytime sleepiness.  Less common symptoms include:  Sneezing.  Coughing.  Itchy nose.  Bloodshot eyes.  How is this diagnosed? This condition may be diagnosed based on:  Your symptoms and medical history.  A physical exam.  Allergy testing to rule out allergic rhinitis. You may have skin tests or blood tests.  In some cases, the health care provider may take a swab of nasal secretions to look for an increased number of eosinophils. This would be done to confirm a diagnosis of NARES. How is this treated? Treatment for this condition depends on the cause. No single treatment works for everyone. Work with your health care provider to find the best treatment for you. Treatment may include:  Avoiding the things that trigger your symptoms.  Using medicines to relieve congestion, such as: ? Steroid nasal spray. There are many types. You may need to try a few to find out which one works best. ?  Decongestant medicine. This may be an oral medicine or a nasal spray. These medicines are only used for a short time.  Using medicines to relieve a runny nose. These may include antihistamine medicines or anticholinergic nasal sprays.  Surgery to remove tissue from inside the nose may be needed in severe cases if the condition has not improved after 6-12 months of medical treatment. Follow these instructions at home:  Take or use over-the-counter and prescription medicines only as told by your health care provider. Do not stop using your medicine even if you start to  feel better.  Use salt-water (saline) rinses or other solutions (nasal washes or irrigations) to wash or rinse out the inside of your nose as told by your health care provider.  Do not take NSAIDs or medicines that contain aspirin if they make your symptoms worse.  Do not drink alcohol if it makes your symptoms worse.  Do not use any tobacco products, such as cigarettes, chewing tobacco, and e-cigarettes. If you need help quitting, ask your health care provider.  Avoid secondhand smoke.  Get some exercise every day. Exercise may help reduce symptoms of nonallergic rhinitis for some people. Ask your health care provider how much exercise and what types of exercise are safe for you.  Sleep with the head of your bed raised (elevated). This may reduce nighttime nasal congestion.  Keep all follow-up visits as told by your health care provider. This is important. Contact a health care provider if:  You have a fever.  Your symptoms are getting worse at home.  Your symptoms are not responding to medicine.  You develop new symptoms, especially a headache or nosebleed. This information is not intended to replace advice given to you by your health care provider. Make sure you discuss any questions you have with your health care provider. Document Released: 10/18/2015 Document Revised: 12/02/2015 Document Reviewed: 09/16/2015 Elsevier Interactive Patient Education  2018 [employer].

## 2018-06-18 NOTE — Progress Notes (Signed)
Subjective:    Patient ID: Kelly Reed, female    DOB: [birth year], 52 y.o.   MRN: [id number]  52y/o Caucasian female pt c/o dry cough, frontal and maxillary sinus pain/pressure, sore throat x6 days. Saw UC 5 days ago, given liquid cough syrup that she reports has given no relief. Had body aches and diarrhea last week both of which have resolved now. Had fever of 101-102 last week as well. Worst sx resolved by Sunday and milder sx of cough, sinus pressure and sore throat have persisted for the past 2 days. Did have a negative rapid flu swab last week. Still some fatigue.  Tongue and throat sore she brushes teeth after every use of Breo inhaler denied history of thrush ever.  Denied mouth sores, dyspnea/dysphagia/wheezing.  Hasn't required her albuterol rescue inhaler just using breo daily. + sick contacts     Review of Systems  Constitutional: Positive for fever. Negative for activity change, appetite change, chills, diaphoresis, fatigue and unexpected weight change.  HENT: Positive for congestion, postnasal drip, rhinorrhea, sinus pressure, sinus pain and sore throat. Negative for dental problem, drooling, ear discharge, ear pain, facial swelling, hearing loss, mouth sores, nosebleeds, sneezing, tinnitus, trouble swallowing and voice change.   Eyes: Negative for photophobia, pain, discharge, redness, itching and visual disturbance.  Respiratory: Positive for cough. Negative for choking, chest tightness, shortness of breath, wheezing and stridor.   Cardiovascular: Negative for chest pain, palpitations and leg swelling.  Gastrointestinal: Positive for diarrhea. Negative for abdominal distention, abdominal pain, blood in stool, constipation, nausea and vomiting.  Endocrine: Negative for cold intolerance and heat intolerance.  Genitourinary: Negative for difficulty urinating, dysuria and hematuria.  Musculoskeletal: Positive for myalgias. Negative for arthralgias, back pain, gait problem, joint  swelling, neck pain and neck stiffness.  Skin: Negative for color change, pallor, rash and wound.  Allergic/Immunologic: Positive for environmental allergies. Negative for food allergies.  Neurological: Negative for dizziness, tremors, seizures, syncope, facial asymmetry, speech difficulty, weakness, light-headedness, numbness and headaches.  Hematological: Negative for adenopathy. Does not bruise/bleed easily.  Psychiatric/Behavioral: Negative for agitation, behavioral problems, confusion and sleep disturbance.       Objective:   Physical Exam  Constitutional: She is oriented to person, place, and time. She appears well-developed and well-nourished. She is active and cooperative.  Non-toxic appearance. She does not have a sickly appearance. She appears ill. No distress.  HENT:  Head: Normocephalic and atraumatic.  Right Ear: Hearing, external ear and ear canal normal. A middle ear effusion is present.  Left Ear: Hearing, external ear and ear canal normal. A middle ear effusion is present.  Nose: Mucosal edema and rhinorrhea present. No nose lacerations, sinus tenderness, nasal deformity, septal deviation or nasal septal hematoma. No epistaxis.  No foreign bodies. Right sinus exhibits maxillary sinus tenderness and frontal sinus tenderness. Left sinus exhibits maxillary sinus tenderness and frontal sinus tenderness.  Mouth/Throat: Uvula is midline. Mucous membranes are not pale, dry and not cyanotic. She does not have dentures. No oral lesions. No trismus in the jaw. Normal dentition. No dental abscesses, uvula swelling, lacerations or dental caries. Posterior oropharyngeal edema and posterior oropharyngeal erythema present. No oropharyngeal exudate or tonsillar abscesses. Tonsils are 0 on the right. Tonsils are 0 on the left. No tonsillar exudate.  Papillae inflamed on tongue 2-3 with white pinpoint; oropharynx macular erythema; cobblestoning posterior pharynx; bilateral allergic shiners;  bilateral TMs air fluid level clear  Eyes: Pupils are equal, round, and reactive to light. Conjunctivae, EOM and  lids are normal. Right eye exhibits no chemosis, no discharge, no exudate and no hordeolum. No foreign body present in the right eye. Left eye exhibits no chemosis, no discharge, no exudate and no hordeolum. No foreign body present in the left eye. Right conjunctiva is not injected. Right conjunctiva has no hemorrhage. Left conjunctiva is not injected. Left conjunctiva has no hemorrhage. No scleral icterus. Right eye exhibits normal extraocular motion and no nystagmus. Left eye exhibits normal extraocular motion and no nystagmus. Right pupil is round and reactive. Left pupil is round and reactive. Pupils are equal.  Neck: Trachea normal and normal range of motion. Neck supple. No tracheal tenderness, no spinous process tenderness and no muscular tenderness present. No neck rigidity. No tracheal deviation, no edema, no erythema and normal range of motion present. No thyroid mass and no thyromegaly present.  Cardiovascular: Normal rate, regular rhythm, S1 normal, S2 normal, normal heart sounds and intact distal pulses. PMI is not displaced. Exam reveals no gallop and no friction rub.  No murmur heard. Pulmonary/Chest: Effort normal and breath sounds normal. No accessory muscle usage or stridor. No respiratory distress. She has no decreased breath sounds. She has no wheezes. She has no rhonchi. She has no rales. She exhibits no tenderness.  Rare nonproductive cough mild noted in exam room; spoke full sentences without difficulty  Abdominal: Soft. She exhibits no distension.  Musculoskeletal: Normal range of motion. She exhibits no edema or tenderness.       Right shoulder: Normal.       Left shoulder: Normal.       Right hip: Normal.       Left hip: Normal.       Right knee: Normal.       Left knee: Normal.       Cervical back: Normal.       Right hand: Normal.       Left hand: Normal.   Lymphadenopathy:       Head (right side): No submental, no submandibular, no tonsillar, no preauricular, no posterior auricular and no occipital adenopathy present.       Head (left side): No submental, no submandibular, no tonsillar, no preauricular, no posterior auricular and no occipital adenopathy present.    She has no cervical adenopathy.       Right cervical: No superficial cervical, no deep cervical and no posterior cervical adenopathy present.      Left cervical: No superficial cervical, no deep cervical and no posterior cervical adenopathy present.  Neurological: She is alert and oriented to person, place, and time. She has normal strength. She is not disoriented. She displays no atrophy and no tremor. No cranial nerve deficit or sensory deficit. She exhibits normal muscle tone. She displays no seizure activity. Coordination and gait normal. GCS eye subscore is 4. GCS verbal subscore is 5. GCS motor subscore is 6.  On/off exam table without difficulty; gait sure and steady in hallway  Skin: Skin is warm, dry and intact. Capillary refill takes less than 2 seconds. No abrasion, no bruising, no burn, no ecchymosis, no laceration, no lesion, no petechiae and no rash noted. She is not diaphoretic. No cyanosis or erythema. No pallor. Nails show no clubbing.  Psychiatric: She has a normal mood and affect. Her speech is normal and behavior is normal. Judgment and thought content normal. Cognition and memory are normal.  Nursing note and vitals reviewed.         Assessment & Plan:  A-acute rhinosinusitis and  mouth sore  P-consider mask at work patient refused stated it makes her breathing difficult; given 1 bottle saline to use at work from clinic stock; has tussionex and another Rx cough medicine at home along with albuterol she can use but doesn't feel she needs them often and albuterol not at all right now. Cough lozenges po q2h prn OTC  Last prednisone 60mg  x 3 days Aug 2019 tolerated and  helped with sinusitis.  Not as severe symptoms today prednisone burst 40mg  po daily with breakfast x 3 days  #21 RF0 dispensed from [facility] to patient.  Continue breo MDI/ flonase/saline use.  SPO2s stable room air 97% today; highest in past year 99% RA  Patient may use normal saline nasal spray 2 sprays each nostril q2h wa as needed. flonase 91mcg 1 spray each nostril BID.  Patient denied personal or family history of ENT cancer.  OTC antihistamine of choice xyzal 5mg  po daily.  Avoid triggers if possible.  Shower prior to bedtime if exposed to triggers.  If allergic dust/dust mites recommend mattress/pillow covers/encasements; washing linens, vacuuming, sweeping, dusting weekly.  Call or return to clinic as needed if these symptoms worsen or fail to improve as anticipated.   Exitcare handout on sinusitis, nonallergic rhinitis and sinus rinse  Patient verbalized understanding of instructions, agreed with plan of care and had no further questions at this time.  P2:  Avoidance and hand washing.  Possibly thrush discussed clotrimazole 10mg  po 5x/days x 7-14 days #70 RF0 handwritten given to patient as she is unsure which pharmacy she is going to use.  Brushing teeth good oral care after Breo, diabetic, recent illness puts her at risk for thrust development discussed with patient.  Get 7-8 hours regular sleep nightly, healthy diet protein/fruits/vegetables/dairy/fiber.  Exitcare handout oral candidiasis. Follow up if dyspnea/dysphagia/dysphasia/mouth sores.  Discussed typical presentation of thrush with patient.  Hydrate to keep urine pale clear yellow tinged. Patient verbalized understanding information/instructions, agreed with plan of care and had no further questions at this time.

## 2018-06-25 ENCOUNTER — Other Ambulatory Visit: Payer: Self-pay | Admitting: Family Medicine

## 2018-08-21 ENCOUNTER — Other Ambulatory Visit: Payer: Self-pay | Admitting: Family Medicine

## 2018-08-21 NOTE — Telephone Encounter (Signed)
Requested Prescriptions  Pending Prescriptions Disp Refills  . metFORMIN (GLUCOPHAGE) 1000 MG tablet [Pharmacy Med Name: METFORMIN 1000MG TABLETS] 180 tablet 3    Sig: TAKE 1 TABLET(1000 MG) BY MOUTH TWICE DAILY WITH A MEAL     Endocrinology:  Diabetes - Biguanides Passed - 08/21/2018 12:22 PM      Passed - Cr in normal range and within 360 days    Creat  Date Value Ref Range Status  03/23/2016 0.60 0.50 - 1.05 mg/dL Final    Comment:      For patients > or = 56 years of age: The upper reference limit for Creatinine is approximately 13% higher for people identified as African-American.      Creatinine, Ser  Date Value Ref Range Status  09/12/2017 0.63 0.57 - 1.00 mg/dL Final         Passed - HBA1C is between 0 and 7.9 and within 180 days    Hemoglobin A1C  Date Value Ref Range Status  04/29/2018 6.6 (A) 4.0 - 5.6 % Final   Hgb A1c MFr Bld  Date Value Ref Range Status  11/21/2016 6.3 (H) 4.8 - 5.6 % Final    Comment:             Pre-diabetes: 5.7 - 6.4          Diabetes: >6.4          Glycemic control for adults with diabetes: <7.0          Passed - eGFR in normal range and within 360 days    GFR calc Af Amer  Date Value Ref Range Status  09/12/2017 118 >59 mL/min/1.73 Final   GFR calc non Af Amer  Date Value Ref Range Status  09/12/2017 103 >59 mL/min/1.73 Final   GFR  Date Value Ref Range Status  01/30/2011 116.26 >60.00 mL/min Final         Passed - Valid encounter within last 6 months    Recent Outpatient Visits          2 months ago Acute URI   [facility] at [facility], Arlie Solomons, MD   3 months ago Right hip pain   [facility], Lilia Argue, MD   7 months ago Moderate persistent asthma with acute exacerbation   [facility], Laurey Arrow, MD   7 months ago Chest tightness   [facility], Damaris Hippo, PA-C   7 months ago Acute recurrent ethmoidal sinusitis   [facility], Laurey Arrow, MD      Future  Appointments            In 2 months Shawnee Knapp, MD [facility] [place], [facility]

## 2018-08-27 ENCOUNTER — Ambulatory Visit: Payer: Self-pay | Admitting: Registered Nurse

## 2018-08-27 ENCOUNTER — Encounter: Payer: Self-pay | Admitting: Registered Nurse

## 2018-08-27 VITALS — BP 129/85 | HR 86 | Temp 98.3°F

## 2018-08-27 DIAGNOSIS — J209 Acute bronchitis, unspecified: Secondary | ICD-10-CM

## 2018-08-27 DIAGNOSIS — J019 Acute sinusitis, unspecified: Secondary | ICD-10-CM

## 2018-08-27 DIAGNOSIS — H6983 Other specified disorders of Eustachian tube, bilateral: Secondary | ICD-10-CM

## 2018-08-27 MED ORDER — PREDNISONE 10 MG PO TABS: ORAL_TABLET | ORAL | 0 refills | Status: AC

## 2018-08-27 NOTE — Patient Instructions (Addendum)
Eustachian Tube Dysfunction  Eustachian tube dysfunction refers to a condition in which a blockage develops in the narrow passage that connects the middle ear to the back of the nose (eustachian tube). The eustachian tube regulates air pressure in the middle ear by letting air move between the ear and nose. It also helps to drain fluid from the middle ear space. Eustachian tube dysfunction can affect one or both ears. When the eustachian tube does not function properly, air pressure, fluid, or both can build up in the middle ear. What are the causes? This condition occurs when the eustachian tube becomes blocked or cannot open normally. Common causes of this condition include:  Ear infections.  Colds and other infections that affect the nose, mouth, and throat (upper respiratory tract).  Allergies.  Irritation from cigarette smoke.  Irritation from stomach acid coming up into the esophagus (gastroesophageal reflux). The esophagus is the tube that carries food from the mouth to the stomach.  Sudden changes in air pressure, such as from descending in an airplane or scuba diving.  Abnormal growths in the nose or throat, such as: ? Growths that line the nose (nasal polyps). ? Abnormal growth of cells (tumors). ? Enlarged tissue at the back of the throat (adenoids). What increases the risk? You are more likely to develop this condition if:  You smoke.  You are overweight.  You are a child who has: ? Certain birth defects of the mouth, such as cleft palate. ? Large tonsils or adenoids. What are the signs or symptoms? Common symptoms of this condition include:  A feeling of fullness in the ear.  Ear pain.  Clicking or popping noises in the ear.  Ringing in the ear.  Hearing loss.  Loss of balance.  Dizziness. Symptoms may get worse when the air pressure around you changes, such as when you travel to an area of high elevation, fly on an airplane, or go scuba diving. How is  this diagnosed? This condition may be diagnosed based on:  Your symptoms.  A physical exam of your ears, nose, and throat.  Tests, such as those that measure: ? The movement of your eardrum (tympanogram). ? Your hearing (audiometry). How is this treated? Treatment depends on the cause and severity of your condition.  In mild cases, you may relieve your symptoms by moving air into your ears. This is called "popping the ears."  In more severe cases, or if you have symptoms of fluid in your ears, treatment may include: ? Medicines to relieve congestion (decongestants). ? Medicines that treat allergies (antihistamines). ? Nasal sprays or ear drops that contain medicines that reduce swelling (steroids). ? A procedure to drain the fluid in your eardrum (myringotomy). In this procedure, a small tube is placed in the eardrum to:  Drain the fluid.  Restore the air in the middle ear space. ? A procedure to insert a balloon device through the nose to inflate the opening of the eustachian tube (balloon dilation). Follow these instructions at home: Lifestyle  Do not do any of the following until your health care provider approves: ? Travel to high altitudes. ? Fly in airplanes. ? Work in a pressurized cabin or room. ? Scuba dive.  Do not use any products that contain nicotine or tobacco, such as cigarettes and e-cigarettes. If you need help quitting, ask your health care provider.  Keep your ears dry. Wear fitted earplugs during showering and bathing. Dry your ears completely after. General instructions  Take over-the-counter   and prescription medicines only as told by your health care provider.  Use techniques to help pop your ears as recommended by your health care provider. These may include: ? Chewing gum. ? Yawning. ? Frequent, forceful swallowing. ? Closing your mouth, holding your nose closed, and gently blowing as if you are trying to blow air out of your nose.  Keep all  follow-up visits as told by your health care provider. This is important. Contact a health care provider if:  Your symptoms do not go away after treatment.  Your symptoms come back after treatment.  You are unable to pop your ears.  You have: ? A fever. ? Pain in your ear. ? Pain in your head or neck. ? Fluid draining from your ear.  Your hearing suddenly changes.  You become very dizzy.  You lose your balance. Summary  Eustachian tube dysfunction refers to a condition in which a blockage develops in the eustachian tube.  It can be caused by ear infections, allergies, inhaled irritants, or abnormal growths in the nose or throat.  Symptoms include ear pain, hearing loss, or ringing in the ears.  Mild cases are treated with maneuvers to unblock the ears, such as yawning or ear popping.  Severe cases are treated with medicines. Surgery may also be done (rare). This information is not intended to replace advice given to you by your health care provider. Make sure you discuss any questions you have with your health care provider. Document Released: 07/23/2015 Document Revised: 10/16/2017 Document Reviewed: 10/16/2017 Elsevier Interactive Patient Education  2019 [employer] Nonallergic Rhinitis Nonallergic rhinitis is a condition that causes symptoms that affect the nose, such as a runny nose and a stuffed-up nose (nasal congestion) that can make it hard to breathe through the nose. This condition is different from having an allergy (allergic rhinitis). Allergic rhinitis occurs when the body's defense system (immune system) reacts to a substance that you are allergic to (allergen), such as pollen, pet dander, mold, or dust. Nonallergic rhinitis has many similar symptoms, but it is not caused by allergens. Nonallergic rhinitis can be a short-term or long-term problem. What are the causes? This condition can be caused by many different things. Some common types of nonallergic rhinitis  include: Infectious rhinitis  This is usually due to an infection in the upper respiratory tract. Vasomotor rhinitis  This is the most common type of long-term nonallergic rhinitis.  It is caused by too much blood flow through the nose, which makes the tissue inside of the nose swell.  Symptoms are often triggered by strong odors, cold air, stress, drinking alcohol, cigarette smoke, or changes in the weather. Occupational rhinitis  This type is caused by triggers in the workplace, such as chemicals, dusts, animal dander, or air pollution. Hormonal rhinitis  This type occurs in women as a result of an increase in the female hormone estrogen.  It may occur during pregnancy, puberty, and menstrual cycles.  Symptoms improve when estrogen levels drop. Drug-induced rhinitis Several drugs can cause nonallergic rhinitis, including:  Medicines that are used to treat high blood pressure, heart disease, and Parkinson disease.  Aspirin and NSAIDs.  Over-the-counter nasal decongestant sprays. These can cause a type of nonallergic rhinitis (rhinitis medicamentosa) when they are used for more than a few days. Nonallergic rhinitis with eosinophilia syndrome (NARES)  This type is caused by having too much of a certain type of white blood cell (eosinophil). Nonallergic rhinitis can also be caused by a reaction to  eating hot or spicy foods. This does not usually cause long-term symptoms. In some cases, the cause of nonallergic rhinitis is not known. What increases the risk? You are more likely to develop this condition if:  You are 10-19 years of age.  You are a woman. Women are twice as likely to have this condition. What are the signs or symptoms? Common symptoms of this condition include:  Nasal congestion.  Runny nose.  The feeling of mucus going down the back of the throat (postnasal drip).  Trouble sleeping at night and daytime sleepiness. Less common symptoms  include:  Sneezing.  Coughing.  Itchy nose.  Bloodshot eyes. How is this diagnosed? This condition may be diagnosed based on:  Your symptoms and medical history.  A physical exam.  Allergy testing to rule out allergic rhinitis. You may have skin tests or blood tests. In some cases, the health care provider may take a swab of nasal secretions to look for an increased number of eosinophils. This would be done to confirm a diagnosis of NARES. How is this treated? Treatment for this condition depends on the cause. No single treatment works for everyone. Work with your health care provider to find the best treatment for you. Treatment may include:  Avoiding the things that trigger your symptoms.  Using medicines to relieve congestion, such as: ? Steroid nasal spray. There are many types. You may need to try a few to find out which one works best. ? Decongestant medicine. This may be an oral medicine or a nasal spray. These medicines are only used for a short time.  Using medicines to relieve a runny nose. These may include antihistamine medicines or anticholinergic nasal sprays.  Surgery to remove tissue from inside the nose may be needed in severe cases if the condition has not improved after 6-12 months of medical treatment. Follow these instructions at home:  Take or use over-the-counter and prescription medicines only as told by your health care provider. Do not stop using your medicine even if you start to feel better.  Use salt-water (saline) rinses or other solutions (nasal washes or irrigations) to wash or rinse out the inside of your nose as told by your health care provider.  Do not take NSAIDs or medicines that contain aspirin if they make your symptoms worse.  Do not drink alcohol if it makes your symptoms worse.  Do not use any tobacco products, such as cigarettes, chewing tobacco, and e-cigarettes. If you need help quitting, ask your health care provider.  Avoid  secondhand smoke.  Get some exercise every day. Exercise may help reduce symptoms of nonallergic rhinitis for some people. Ask your health care provider how much exercise and what types of exercise are safe for you.  Sleep with the head of your bed raised (elevated). This may reduce nighttime nasal congestion.  Keep all follow-up visits as told by your health care provider. This is important. Contact a health care provider if:  You have a fever.  Your symptoms are getting worse at home.  Your symptoms are not responding to medicine.  You develop new symptoms, especially a headache or nosebleed. This information is not intended to replace advice given to you by your health care provider. Make sure you discuss any questions you have with your health care provider. Document Released: 10/18/2015 Document Revised: 12/02/2015 Document Reviewed: 09/16/2015 Elsevier Interactive Patient Education  2019 [employer] Allergic Rhinitis, Adult Allergic rhinitis is an allergic reaction that affects the mucous membrane  inside the nose. It causes sneezing, a runny or stuffy nose, and the feeling of mucus going down the back of the throat (postnasal drip). Allergic rhinitis can be mild to severe. There are two types of allergic rhinitis:  Seasonal. This type is also called hay fever. It happens only during certain seasons.  Perennial. This type can happen at any time of the year. What are the causes? This condition happens when the body's defense system (immune system) responds to certain harmless substances called allergens as though they were germs.  Seasonal allergic rhinitis is triggered by pollen, which can come from grasses, trees, and weeds. Perennial allergic rhinitis may be caused by:  House dust mites.  Pet dander.  Mold spores. What are the signs or symptoms? Symptoms of this condition include:  Sneezing.  Runny or stuffy nose (nasal congestion).  Postnasal drip.  Itchy  nose.  Tearing of the eyes.  Trouble sleeping.  Daytime sleepiness. How is this diagnosed? This condition may be diagnosed based on:  Your medical history.  A physical exam.  Tests to check for related conditions, such as: ? Asthma. ? Pink eye. ? Ear infection. ? Upper respiratory infection.  Tests to find out which allergens trigger your symptoms. These may include skin or blood tests. How is this treated? There is no cure for this condition, but treatment can help control symptoms. Treatment may include:  Taking medicines that block allergy symptoms, such as antihistamines. Medicine may be given as a shot, nasal spray, or pill.  Avoiding the allergen.  Desensitization. This treatment involves getting ongoing shots until your body becomes less sensitive to the allergen. This treatment may be done if other treatments do not help.  If taking medicine and avoiding the allergen does not work, new, stronger medicines may be prescribed. Follow these instructions at home:  Find out what you are allergic to. Common allergens include smoke, dust, and pollen.  Avoid the things you are allergic to. These are some things you can do to help avoid allergens: ? Replace carpet with wood, tile, or vinyl flooring. Carpet can trap dander and dust. ? Do not smoke. Do not allow smoking in your home. ? Change your heating and air conditioning filter at least once a month. ? During allergy season:  Keep windows closed as much as possible.  Plan outdoor activities when pollen counts are lowest. This is usually during the evening hours.  When coming indoors, change clothing and shower before sitting on furniture or bedding.  Take over-the-counter and prescription medicines only as told by your health care provider.  Keep all follow-up visits as told by your health care provider. This is important. Contact a health care provider if:  You have a fever.  You develop a persistent  cough.  You make whistling sounds when you breathe (you wheeze).  Your symptoms interfere with your normal daily activities. Get help right away if:  You have shortness of breath. Summary  This condition can be managed by taking medicines as directed and avoiding allergens.  Contact your health care provider if you develop a persistent cough or fever.  During allergy season, keep windows closed as much as possible. This information is not intended to replace advice given to you by your health care provider. Make sure you discuss any questions you have with your health care provider. Document Released: 03/21/2001 Document Revised: 08/03/2016 Document Reviewed: 08/03/2016 Elsevier Interactive Patient Education  2019 [employer] Acute Bronchitis, Adult  Acute bronchitis is  sudden (acute) swelling of the air tubes (bronchi) in the lungs. Acute bronchitis causes these tubes to fill with mucus, which can make it hard to breathe. It can also cause coughing or wheezing. In adults, acute bronchitis usually goes away within 2 weeks. A cough caused by bronchitis may last up to 3 weeks. Smoking, allergies, and asthma can make the condition worse. Repeated episodes of bronchitis may cause further lung problems, such as chronic obstructive pulmonary disease (COPD). What are the causes? This condition can be caused by germs and by substances that irritate the lungs, including:  Cold and flu viruses. This condition is most often caused by the same virus that causes a cold.  Bacteria.  Exposure to tobacco smoke, dust, fumes, and air pollution. What increases the risk? This condition is more likely to develop in people who:  Have close contact with someone with acute bronchitis.  Are exposed to lung irritants, such as tobacco smoke, dust, fumes, and vapors.  Have a weak immune system.  Have a respiratory condition such as asthma. What are the signs or symptoms? Symptoms of this condition  include:  A cough.  Coughing up clear, yellow, or green mucus.  Wheezing.  Chest congestion.  Shortness of breath.  A fever.  Body aches.  Chills.  A sore throat. How is this diagnosed? This condition is usually diagnosed with a physical exam. During the exam, your health care provider may order tests, such as chest X-rays, to rule out other conditions. He or she may also:  Test a sample of your mucus for bacterial infection.  Check the level of oxygen in your blood. This is done to check for pneumonia.  Do a chest X-ray or lung function testing to rule out pneumonia and other conditions.  Perform blood tests. Your health care provider will also ask about your symptoms and medical history. How is this treated? Most cases of acute bronchitis clear up over time without treatment. Your health care provider may recommend:  Drinking more fluids. Drinking more makes your mucus thinner, which may make it easier to breathe.  Taking a medicine for a fever or cough.  Taking an antibiotic medicine.  Using an inhaler to help improve shortness of breath and to control a cough.  Using a cool mist vaporizer or humidifier to make it easier to breathe. Follow these instructions at home: Medicines  Take over-the-counter and prescription medicines only as told by your health care provider.  If you were prescribed an antibiotic, take it as told by your health care provider. Do not stop taking the antibiotic even if you start to feel better. General instructions   Get plenty of rest.  Drink enough fluids to keep your urine pale yellow.  Avoid smoking and secondhand smoke. Exposure to cigarette smoke or irritating chemicals will make bronchitis worse. If you smoke and you need help quitting, ask your health care provider. Quitting smoking will help your lungs heal faster.  Use an inhaler, cool mist vaporizer, or humidifier as told by your health care provider.  Keep all follow-up  visits as told by your health care provider. This is important. How is this prevented? To lower your risk of getting this condition again:  Wash your hands often with soap and water. If soap and water are not available, use hand sanitizer.  Avoid contact with people who have cold symptoms.  Try not to touch your hands to your mouth, nose, or eyes.  Make sure to get the flu  shot every year. Contact a health care provider if:  Your symptoms do not improve in 2 weeks of treatment. Get help right away if:  You cough up blood.  You have chest pain.  You have severe shortness of breath.  You become dehydrated.  You faint or keep feeling like you are going to faint.  You keep vomiting.  You have a severe headache.  Your fever or chills gets worse. This information is not intended to replace advice given to you by your health care provider. Make sure you discuss any questions you have with your health care provider. Document Released: 08/03/2004 Document Revised: 02/07/2017 Document Reviewed: 12/15/2015 Elsevier Interactive Patient Education  2019 [employer] Sinusitis, Adult Sinusitis is inflammation of your sinuses. Sinuses are hollow spaces in the bones around your face. Your sinuses are located:  Around your eyes.  In the middle of your forehead.  Behind your nose.  In your cheekbones. Mucus normally drains out of your sinuses. When your nasal tissues become inflamed or swollen, mucus can become trapped or blocked. This allows bacteria, viruses, and fungi to grow, which leads to infection. Most infections of the sinuses are caused by a virus. Sinusitis can develop quickly. It can last for up to 4 weeks (acute) or for more than 12 weeks (chronic). Sinusitis often develops after a cold. What are the causes? This condition is caused by anything that creates swelling in the sinuses or stops mucus from draining. This includes:  Allergies.  Asthma.  Infection from  bacteria or viruses.  Deformities or blockages in your nose or sinuses.  Abnormal growths in the nose (nasal polyps).  Pollutants, such as chemicals or irritants in the air.  Infection from fungi (rare). What increases the risk? You are more likely to develop this condition if you:  Have a weak body defense system (immune system).  Do a lot of swimming or diving.  Overuse nasal sprays.  Smoke. What are the signs or symptoms? The main symptoms of this condition are pain and a feeling of pressure around the affected sinuses. Other symptoms include:  Stuffy nose or congestion.  Thick drainage from your nose.  Swelling and warmth over the affected sinuses.  Headache.  Upper toothache.  A cough that may get worse at night.  Extra mucus that collects in the throat or the back of the nose (postnasal drip).  Decreased sense of smell and taste.  Fatigue.  A fever.  Sore throat.  Bad breath. How is this diagnosed? This condition is diagnosed based on:  Your symptoms.  Your medical history.  A physical exam.  Tests to find out if your condition is acute or chronic. This may include: ? Checking your nose for nasal polyps. ? Viewing your sinuses using a device that has a light (endoscope). ? Testing for allergies or bacteria. ? Imaging tests, such as an MRI or CT scan. In rare cases, a bone biopsy may be done to rule out more serious types of fungal sinus disease. How is this treated? Treatment for sinusitis depends on the cause and whether your condition is chronic or acute.  If caused by a virus, your symptoms should go away on their own within 10 days. You may be given medicines to relieve symptoms. They include: ? Medicines that shrink swollen nasal passages (topical intranasal decongestants). ? Medicines that treat allergies (antihistamines). ? A spray that eases inflammation of the nostrils (topical intranasal corticosteroids). ? Rinses that help get rid of  thick  mucus in your nose (nasal saline washes).  If caused by bacteria, your health care provider may recommend waiting to see if your symptoms improve. Most bacterial infections will get better without antibiotic medicine. You may be given antibiotics if you have: ? A severe infection. ? A weak immune system.  If caused by narrow nasal passages or nasal polyps, you may need to have surgery. Follow these instructions at home: Medicines  Take, use, or apply over-the-counter and prescription medicines only as told by your health care provider. These may include nasal sprays.  If you were prescribed an antibiotic medicine, take it as told by your health care provider. Do not stop taking the antibiotic even if you start to feel better. Hydrate and humidify   Drink enough fluid to keep your urine pale yellow. Staying hydrated will help to thin your mucus.  Use a cool mist humidifier to keep the humidity level in your home above 50%.  Inhale steam for 10-15 minutes, 3-4 times a day, or as told by your health care provider. You can do this in the bathroom while a hot shower is running.  Limit your exposure to cool or dry air. Rest  Rest as much as possible.  Sleep with your head raised (elevated).  Make sure you get enough sleep each night. General instructions   Apply a warm, moist washcloth to your face 3-4 times a day or as told by your health care provider. This will help with discomfort.  Wash your hands often with soap and water to reduce your exposure to germs. If soap and water are not available, use hand sanitizer.  Do not smoke. Avoid being around people who are smoking (secondhand smoke).  Keep all follow-up visits as told by your health care provider. This is important. Contact a health care provider if:  You have a fever.  Your symptoms get worse.  Your symptoms do not improve within 10 days. Get help right away if:  You have a severe headache.  You have  persistent vomiting.  You have severe pain or swelling around your face or eyes.  You have vision problems.  You develop confusion.  Your neck is stiff.  You have trouble breathing. Summary  Sinusitis is soreness and inflammation of your sinuses. Sinuses are hollow spaces in the bones around your face.  This condition is caused by nasal tissues that become inflamed or swollen. The swelling traps or blocks the flow of mucus. This allows bacteria, viruses, and fungi to grow, which leads to infection.  If you were prescribed an antibiotic medicine, take it as told by your health care provider. Do not stop taking the antibiotic even if you start to feel better.  Keep all follow-up visits as told by your health care provider. This is important. This information is not intended to replace advice given to you by your health care provider. Make sure you discuss any questions you have with your health care provider. Document Released: 06/26/2005 Document Revised: 11/26/2017 Document Reviewed: 11/26/2017 [employer] Interactive Patient Education  2019 [employer]. How to Perform a Sinus Rinse A sinus rinse is a home treatment that is used to rinse your sinuses with a sterile mixture of salt and water (saline solution). Sinuses are air-filled spaces in your skull behind the bones of your face and forehead that open into your nasal cavity. A sinus rinse can help to clear mucus, dirt, dust, or pollen from your nasal cavity. You may do a sinus rinse when  you have a cold, a virus, nasal allergy symptoms, a sinus infection, or stuffiness in your nose or sinuses. Talk with your health care provider about whether a sinus rinse might help you. What are the risks? A sinus rinse is generally safe and effective. However, there are a few risks, which include:  A burning sensation in your sinuses. This may happen if you do not make the saline solution as directed. Be sure to follow all directions when making  the saline solution.  Nasal irritation.  Infection from contaminated water. This is rare, but possible. Do not do a sinus rinse if you have had ear or nasal surgery, ear infection, or blocked ears. Supplies needed:  Saline solution or powder.  Distilled or sterile water may be needed to mix with saline powder. ? You may use boiled and cooled tap water. Boil tap water for 5 minutes; cool until it is lukewarm. Use within 24 hours. ? Do not use regular tap water to mix with the saline solution.  Neti pot or nasal rinse bottle. These supplies release the saline solution into your nose and through your sinuses. Neti pots and nasal rinse bottles can be purchased at [employer], a health food store, or online. How to perform a sinus rinse  1. Wash your hands with soap and water. 2. Wash your device according to the directions that came with the product and then dry it. 3. Use the solution that comes with your product or one that is sold separately in stores. Follow the mixing directions on the package if you need to mix with sterile or distilled water. 4. Fill the device with the amount of saline solution noted in the device instructions. 5. Stand over a sink and tilt your head sideways over the sink. 6. Place the spout of the device in your upper nostril (the one closer to the ceiling). 7. Gently pour or squeeze the saline solution into your nasal cavity. The liquid should drain out from the lower nostril if you are not too congested. 8. While rinsing, breathe through your open mouth. 9. Gently blow your nose to clear any mucus and rinse solution. Blowing too hard may cause ear pain. 10. Repeat in your other nostril. 11. Clean and rinse your device with clean water and then air-dry it. Talk with your health care provider or pharmacist if you have questions about how to do a sinus rinse. Summary  A sinus rinse is a home treatment that is used to rinse your sinuses with a sterile  mixture of salt and water (saline solution).  A sinus rinse is generally safe and effective. Follow all instructions carefully.  Before doing a sinus rinse, talk with your health care provider about whether it would be helpful for you. This information is not intended to replace advice given to you by your health care provider. Make sure you discuss any questions you have with your health care provider. Document Released: 01/21/2014 Document Revised: 04/23/2017 Document Reviewed: 04/23/2017 Elsevier Interactive Patient Education  2019 [employer].

## 2018-08-27 NOTE — Progress Notes (Signed)
Subjective:    Patient ID: Kelly Reed, female    DOB: 01/30/1966, 53 y.o.   MRN: [id number]  52y/o Caucasian female c/o sore/tight throat and maxillary sinus pain/pressure. Denies upper dental pain. Bilateral otalgia, ear fullness, intermittent muffled sounds. No ear drainage. Deep breaths do not hurt, but feel slightly tight, do induce coughing. Used albuterol inhaler every day last week but has not felt she has needed it since yesterday. Also taking Mucinex at home. Taking her xyzal, singulair, flonase and nasal saline every day, albuterol prn.  Scheduled to her immunotherapy injection today missed last week due to illness had fever one day last week.  + sick contacts at work viral URI  Didn't use clotrimazole in December just salt water mouth rinses and mouth lesions resolved   Review of Systems  Constitutional: Positive for fatigue. Negative for activity change, appetite change, chills, diaphoresis, fever and unexpected weight change.  HENT: Positive for congestion, ear pain, hearing loss, postnasal drip, rhinorrhea, sinus pressure, sinus pain and sore throat. Negative for dental problem, drooling, ear discharge, facial swelling, mouth sores, nosebleeds, sneezing, tinnitus, trouble swallowing and voice change.   Eyes: Negative for photophobia, pain, discharge, redness, itching and visual disturbance.  Respiratory: Positive for cough and chest tightness. Negative for choking, shortness of breath, wheezing and stridor.   Cardiovascular: Negative for chest pain, palpitations and leg swelling.  Gastrointestinal: Negative for abdominal distention, abdominal pain, diarrhea, nausea and vomiting.  Endocrine: Negative for cold intolerance and heat intolerance.  Genitourinary: Negative for difficulty urinating, dysuria and hematuria.  Musculoskeletal: Negative for arthralgias, back pain, gait problem, joint swelling, myalgias, neck pain and neck stiffness.  Skin: Negative for color change, pallor,  rash and wound.  Allergic/Immunologic: Positive for environmental allergies. Negative for food allergies.  Neurological: Positive for headaches. Negative for dizziness, tremors, seizures, syncope, facial asymmetry, speech difficulty, weakness, light-headedness and numbness.  Hematological: Negative for adenopathy. Does not bruise/bleed easily.  Psychiatric/Behavioral: Negative for agitation, behavioral problems, confusion and sleep disturbance.       Objective:   Physical Exam Vitals signs and nursing note reviewed.  Constitutional:      General: She is awake. She is not in acute distress.    Appearance: Normal appearance. She is well-developed and well-groomed. She is ill-appearing. She is not toxic-appearing or diaphoretic.  HENT:     Head: Normocephalic and atraumatic.     Jaw: There is normal jaw occlusion. No trismus.     Salivary Glands: Right salivary gland is not diffusely enlarged or tender. Left salivary gland is not diffusely enlarged or tender.     Right Ear: Hearing, ear canal and external ear normal. A middle ear effusion is present. There is no impacted cerumen.     Left Ear: Hearing, ear canal and external ear normal. A middle ear effusion is present. There is no impacted cerumen.     Nose: Mucosal edema, congestion and rhinorrhea present. No nasal deformity, septal deviation or laceration.     Right Turbinates: Enlarged and swollen. Not pale.     Left Turbinates: Enlarged and swollen. Not pale.     Right Sinus: No maxillary sinus tenderness or frontal sinus tenderness.     Left Sinus: No maxillary sinus tenderness or frontal sinus tenderness.     Comments: Cobblestoning posterior pharynx; macular erythema oropharynx; bilateral allergic shiners; bilateral TMs air fluid level clear; nasal turbinates edema erythema clear discharge    Mouth/Throat:     Lips: Pink. No lesions.  Mouth: Mucous membranes are not pale, dry and not cyanotic. No lacerations, oral lesions or  angioedema.     Dentition: Normal dentition. Does not have dentures. No dental caries or dental abscesses.     Tongue: No lesions.     Pharynx: Uvula midline. Pharyngeal swelling and posterior oropharyngeal erythema present. No oropharyngeal exudate or uvula swelling.     Tonsils: No tonsillar exudate or tonsillar abscesses. Swelling: 0 on the right. 0 on the left.  Eyes:     General: Lids are normal. Allergic shiner present. No visual field deficit or scleral icterus.       Right eye: No foreign body, discharge or hordeolum.        Left eye: No foreign body, discharge or hordeolum.     Extraocular Movements: Extraocular movements intact.     Right eye: Normal extraocular motion and no nystagmus.     Left eye: Normal extraocular motion and no nystagmus.     Conjunctiva/sclera: Conjunctivae normal.     Right eye: Right conjunctiva is not injected. No chemosis, exudate or hemorrhage.    Left eye: Left conjunctiva is not injected. No chemosis, exudate or hemorrhage.    Pupils: Pupils are equal, round, and reactive to light. Pupils are equal.     Right eye: Pupil is round and reactive.     Left eye: Pupil is round and reactive.  Neck:     Musculoskeletal: Normal range of motion and neck supple. Normal range of motion. No edema, erythema, neck rigidity, spinous process tenderness or muscular tenderness.     Thyroid: No thyroid mass or thyromegaly.     Trachea: Trachea and phonation normal. No tracheal tenderness or tracheal deviation.  Cardiovascular:     Rate and Rhythm: Normal rate and regular rhythm.     Chest Wall: PMI is not displaced.     Heart sounds: Normal heart sounds, S1 normal and S2 normal. No murmur. No friction rub. No gallop.   Pulmonary:     Effort: Pulmonary effort is normal. No accessory muscle usage or respiratory distress.     Breath sounds: Normal breath sounds and air entry. No stridor, decreased air movement or transmitted upper airway sounds. No decreased breath  sounds, wheezing, rhonchi or rales.     Comments: No cough observed in exam room; spoke full sentences without difficulty Chest:     Chest wall: No tenderness.  Abdominal:     General: There is no distension.     Palpations: Abdomen is soft.  Musculoskeletal: Normal range of motion.        General: No tenderness.     Right shoulder: Normal.     Left shoulder: Normal.     Right elbow: Normal.    Left elbow: Normal.     Right hip: Normal.     Left hip: Normal.     Right knee: Normal.     Left knee: Normal.     Cervical back: Normal.     Thoracic back: Normal.     Lumbar back: Normal.     Right hand: Normal.     Left hand: Normal.     Right lower leg: No edema.     Left lower leg: No edema.  Lymphadenopathy:     Head:     Right side of head: No submental, submandibular, tonsillar, preauricular, posterior auricular or occipital adenopathy.     Left side of head: No submental, submandibular, tonsillar, preauricular, posterior auricular or occipital adenopathy.  Cervical: No cervical adenopathy.     Right cervical: No superficial, deep or posterior cervical adenopathy.    Left cervical: No superficial, deep or posterior cervical adenopathy.  Skin:    General: Skin is warm and dry.     Capillary Refill: Capillary refill takes less than 2 seconds.     Coloration: Skin is not ashen, cyanotic, jaundiced, mottled, pale or sallow.     Findings: No abrasion, abscess, acne, bruising, burn, ecchymosis, erythema, signs of injury, laceration, lesion, petechiae, rash or wound.     Nails: There is no clubbing.   Neurological:     General: No focal deficit present.     Mental Status: She is alert and oriented to person, place, and time. Mental status is at baseline. She is not disoriented.     GCS: GCS eye subscore is 4. GCS verbal subscore is 5. GCS motor subscore is 6.     Cranial Nerves: Cranial nerves are intact. No cranial nerve deficit, dysarthria or facial asymmetry.     Sensory:  Sensation is intact. No sensory deficit.     Motor: Motor function is intact. No weakness, tremor, atrophy, abnormal muscle tone or seizure activity.     Coordination: Coordination is intact. Coordination normal.     Gait: Gait normal.     Comments: Gait sure and steady in hallway; on/off exam table and in/out of chair without difficulty  Psychiatric:        Attention and Perception: Attention and perception normal.        Mood and Affect: Mood and affect normal.        Speech: Speech normal.        Behavior: Behavior normal. Behavior is cooperative.        Thought Content: Thought content normal.        Cognition and Memory: Cognition and memory normal.        Judgment: Judgment normal.           Assessment & Plan:  A-acute rhinosinusitis and eustachian tube dysfunction  P-consider mask at work patient refused stated it makes her breathing difficult; Cough lozenges po q2h prn OTC  Last prednisone 40mg  x 3 days Dec 2019 tolerated and helped with sinusitis.  Not as severe symptoms today prednisone burst 20mg  po daily with breakfast x 3 days then 10mg  po x3 days  #21 RF0 dispensed from [facility] to patient. Start prednisone tomorrow as getting immunotherapy injection today. Continue breo MDI/ flonase/saline use.  SPO2s stable room air 98% today; highest in past year 99% RA  Patient may use normal saline nasal spray 2 sprays each nostril q2h wa as needed. flonase 78mcg 1 spray each nostril BID. singulair 10mg  po qhs and xyzal 5mg  po daily.  Patient denied personal or family history of ENT cancer.  OTC antihistamine of choice xyzal 5mg  po daily.  Avoid triggers if possible.  Shower prior to bedtime if exposed to triggers.  If allergic dust/dust mites recommend mattress/pillow covers/encasements; washing linens, vacuuming, sweeping, dusting weekly.  Call or return to clinic as needed if these symptoms worsen or fail to improve as anticipated.   Exitcare handout on sinusitis, nonallergic rhinitis and  sinus rinse  Patient verbalized understanding of instructions, agreed with plan of care and had no further questions at this time.  P2:  Avoidance and hand washing.   Cough lozenges po q2h prn cough given 8 UD from clinic stock.  Prednisone taper 10mg  (20mg  x 3 days and 10mg  x 3  days) po daily with breakfast #21 RF0 dispensed from [facility].  Discussed possible side effects increased/decreased appetite, difficulty sleeping, increased blood sugar, increased blood pressure and heart rate.  Albuterol MDI 66mcg 1-2 puffs po q4-6h prn protracted cough/wheeze  side effect increased heart rate. Bronchitis simple, community acquired, may have started as viral (probably respiratory syncytial, parainfluenza, influenza, or adenovirus), but now evidence of acute purulent bronchitis with resultant bronchial edema and mucus formation.  Viruses are the most common cause of bronchial inflammation in otherwise healthy adults with acute bronchitis.  The appearance of sputum is not predictive of whether a bacterial infection is present.  Purulent sputum is most often caused by viral infections.  There are a small portion of those caused by non-viral agents being Mycoplama pneumonia.  Microscopic examination or C&S of sputum in the healthy adult with acute bronchitis is generally not helpful (usually negative or normal respiratory flora) other considerations being cough from upper respiratory tract infections, sinusitis or allergic syndromes (mild asthma or viral pneumonia).  Differential Diagnoses:  reactive airway disease (asthma, allergic aspergillosis (eosinophilia), chronic bronchitis, respiratory infection (sinusitis, common cold, pneumonia), congestive heart failure, reflux esophagitis, bronchogenic tumor, aspiration syndromes and/or exposure to pulmonary irritants/smoke.  Without high fever, severe dyspnea, lack of physical findings or other risk factors, I will hold on a chest radiograph and CBC at this time.  I discussed that  approximately 50% of patients with acute bronchitis have a cough that lasts up to three weeks, and 25% for over a month.  Tylenol 500mg  one to two tablets every four to six hours as needed for fever or myalgias.  No aspirin. Exitcare handout on bronchitis and inhaler use .  ER if hemopthysis, SOB, worst chest pain of life.   Patient instructed to follow up in one week if not improved or sooner if symptoms worsen.  Patient verbalized agreement and understanding of treatment plan.  P2:  hand washing and cover cough  Supportive treatment.   No evidence of invasive bacterial infection, non toxic and well hydrated.  This is most likely self limiting viral infection.  I do not see where any further testing or imaging is necessary at this time.   I will suggest supportive care, rest, good hygiene and encourage the patient to take adequate fluids.  The patient is to return to clinic or EMERGENCY ROOM if symptoms worsen or change significantly e.g. ear pain, fever, purulent discharge from ears or bleeding.  Exitcare handout on eustachian tube dysfunction.  Patient verbalized agreement and understanding of treatment plan.

## 2018-09-09 ENCOUNTER — Telehealth: Payer: Self-pay | Admitting: Family Medicine

## 2018-09-09 NOTE — Telephone Encounter (Signed)
Called patient in regards to her appt scheduled with Dr. Brigitte Pulse on 11/04/2018. The provider left our office so the appt has been cancelled. Did not leave a VM because patient did not give permission per dpr.

## 2018-09-10 ENCOUNTER — Ambulatory Visit: Payer: Self-pay | Admitting: Registered Nurse

## 2018-09-10 VITALS — BP 137/95 | HR 93

## 2018-09-10 DIAGNOSIS — B9789 Other viral agents as the cause of diseases classified elsewhere: Principal | ICD-10-CM

## 2018-09-10 DIAGNOSIS — A084 Viral intestinal infection, unspecified: Secondary | ICD-10-CM

## 2018-09-10 DIAGNOSIS — J069 Acute upper respiratory infection, unspecified: Secondary | ICD-10-CM

## 2018-09-10 MED ORDER — OSELTAMIVIR PHOSPHATE 75 MG PO CAPS: 75.0000 mg | ORAL_CAPSULE | Freq: Two times a day (BID) | ORAL | 0 refills | Status: AC

## 2018-09-10 MED ORDER — PHENYLEPHRINE HCL 5 MG PO TABS: 5.0000 mg | ORAL_TABLET | Freq: Four times a day (QID) | ORAL | 0 refills | Status: AC | PRN

## 2018-09-10 MED ORDER — PREDNISONE 10 MG PO TABS: 40.0000 mg | ORAL_TABLET | Freq: Every day | ORAL | 0 refills | Status: AC

## 2018-09-10 MED ORDER — LOPERAMIDE HCL 2 MG PO CAPS: 2.0000 mg | ORAL_CAPSULE | ORAL | 0 refills | Status: AC | PRN

## 2018-09-10 NOTE — Progress Notes (Signed)
Subjective:    Patient ID: Kelly Reed, female    DOB: 08-Dec-1965, 53 y.o.   MRN: [id number]  52y/o Caucasian established female pt c/o 2 days of chest congestion, nonproductive cough, frontal and maxillary sinus pain and pressure, bilateral otalgia, HA, scratchy throat. Reports HA after bending over and standing back up. +body aches, fatigue. Reports 100F at home yesterday. Using humidifier at home, with nasal saline spray and drinking lots of water but still feels dry. Decreased appetite. Took last of codeine cough syrup at home last night. Taking Mucinex DM syrup today without much relief.   Lower teeth hurting.  Still has albuterol MDI available at home.  Last prednisone Aug/Dec/Feb tapers 10mg  max 60mg  but prefers less due to side effects.  + sick contacts at work some coworkers with flu or other viral URI   Patient called back at 1200 to notify us that she forgot to tell us she had diarrhea yesterday also but none today and that she decided to go home and rest instead of staying at work today.  Review of Systems  Constitutional: Positive for activity change, appetite change and fatigue. Negative for chills, diaphoresis and fever.  HENT: Positive for congestion, ear pain, postnasal drip, rhinorrhea, sinus pressure, sinus pain and sore throat. Negative for dental problem, drooling, ear discharge, facial swelling, hearing loss, mouth sores, nosebleeds, trouble swallowing and voice change.   Eyes: Negative for photophobia, pain, discharge, redness, itching and visual disturbance.  Respiratory: Positive for cough. Negative for choking, shortness of breath, wheezing and stridor.   Cardiovascular: Negative for chest pain.  Gastrointestinal: Positive for diarrhea. Negative for abdominal distention, abdominal pain, blood in stool, nausea and vomiting.  Endocrine: Negative for cold intolerance and heat intolerance.  Genitourinary: Negative for difficulty urinating.  Musculoskeletal: Positive for  myalgias. Negative for neck pain and neck stiffness.  Skin: Negative for color change, pallor, rash and wound.  Allergic/Immunologic: Positive for environmental allergies. Negative for food allergies.  Neurological: Positive for headaches. Negative for dizziness, tremors, seizures, syncope, facial asymmetry, speech difficulty, weakness, light-headedness and numbness.  Psychiatric/Behavioral: Negative for agitation, confusion and sleep disturbance.       Objective:   Physical Exam Vitals signs and nursing note reviewed.  Constitutional:      General: She is awake. She is not in acute distress.    Appearance: She is well-developed and well-groomed. She is ill-appearing. She is not toxic-appearing or diaphoretic.  HENT:     Head: Normocephalic and atraumatic.     Jaw: There is normal jaw occlusion. No trismus.     Salivary Glands: Right salivary gland is not diffusely enlarged or tender. Left salivary gland is not diffusely enlarged or tender.     Right Ear: Hearing, ear canal and external ear normal. A middle ear effusion is present. There is no impacted cerumen.     Left Ear: Hearing, ear canal and external ear normal. A middle ear effusion is present. There is no impacted cerumen.     Nose: Mucosal edema, congestion and rhinorrhea present. No nasal deformity, septal deviation or laceration.     Right Turbinates: Enlarged and swollen.     Left Turbinates: Enlarged and swollen.     Right Sinus: Maxillary sinus tenderness and frontal sinus tenderness present.     Left Sinus: Maxillary sinus tenderness and frontal sinus tenderness present.     Mouth/Throat:     Lips: Pink. No lesions.     Mouth: Mucous membranes are moist. Mucous membranes  are not pale, not dry and not cyanotic. No lacerations, oral lesions or angioedema.     Dentition: Normal dentition. Does not have dentures. No dental caries or dental abscesses.     Tongue: No lesions.     Pharynx: Uvula midline. Pharyngeal swelling and  posterior oropharyngeal erythema present. No oropharyngeal exudate or uvula swelling.     Tonsils: No tonsillar exudate or tonsillar abscesses. Swelling: 0 on the right. 0 on the left.     Comments: Cobblestoning posterior pharynx; bilateral allergic shiners; nasal turbinates swollen clear discharge bilateral TMs air fluid level clear Eyes:     General: Lids are normal. Allergic shiner present. No visual field deficit or scleral icterus.       Right eye: No foreign body, discharge or hordeolum.        Left eye: No foreign body, discharge or hordeolum.     Extraocular Movements: Extraocular movements intact.     Right eye: Normal extraocular motion and no nystagmus.     Left eye: Normal extraocular motion and no nystagmus.     Conjunctiva/sclera: Conjunctivae normal.     Right eye: Right conjunctiva is not injected. No chemosis, exudate or hemorrhage.    Left eye: Left conjunctiva is not injected. No chemosis, exudate or hemorrhage.    Pupils: Pupils are equal, round, and reactive to light. Pupils are equal.     Right eye: Pupil is round and reactive.     Left eye: Pupil is round and reactive.  Neck:     Musculoskeletal: Normal range of motion and neck supple. Normal range of motion. No edema, erythema, neck rigidity, crepitus, pain with movement, torticollis, spinous process tenderness or muscular tenderness.     Thyroid: No thyroid mass or thyromegaly.     Trachea: Trachea normal. No tracheal tenderness or tracheal deviation.  Cardiovascular:     Rate and Rhythm: Normal rate and regular rhythm.     Chest Wall: PMI is not displaced.     Heart sounds: Normal heart sounds, S1 normal and S2 normal. No murmur. No friction rub. No gallop.   Pulmonary:     Effort: Pulmonary effort is normal. No accessory muscle usage or respiratory distress.     Breath sounds: Normal breath sounds and air entry. No stridor, decreased air movement or transmitted upper airway sounds. No decreased breath sounds,  wheezing, rhonchi or rales.     Comments: Rare nonproductive cough in exam room; spoke full sentences without difficulty Chest:     Chest wall: No tenderness.  Abdominal:     General: There is no distension.     Palpations: Abdomen is soft.  Musculoskeletal: Normal range of motion.        General: No tenderness.     Right shoulder: Normal.     Left shoulder: Normal.     Right elbow: Normal.    Left elbow: Normal.     Right hip: Normal.     Left hip: Normal.     Right knee: Normal.     Left knee: Normal.     Cervical back: Normal.     Thoracic back: Normal.     Lumbar back: Normal.     Right hand: Normal.     Left hand: Normal.     Right lower leg: No edema.     Left lower leg: No edema.  Lymphadenopathy:     Head:     Right side of head: No submental, submandibular, tonsillar, preauricular, posterior auricular or  occipital adenopathy.     Left side of head: No submental, submandibular, tonsillar, preauricular, posterior auricular or occipital adenopathy.     Cervical: No cervical adenopathy.     Right cervical: No superficial, deep or posterior cervical adenopathy.    Left cervical: No superficial, deep or posterior cervical adenopathy.  Skin:    General: Skin is warm and dry.     Capillary Refill: Capillary refill takes less than 2 seconds.     Coloration: Skin is not ashen, cyanotic, jaundiced, mottled, pale or sallow.     Findings: No abrasion, abscess, acne, bruising, burn, ecchymosis, erythema, laceration, lesion, petechiae, rash or wound.     Nails: There is no clubbing.   Neurological:     General: No focal deficit present.     Mental Status: She is alert and oriented to person, place, and time. Mental status is at baseline. She is not disoriented.     GCS: GCS eye subscore is 4. GCS verbal subscore is 5. GCS motor subscore is 6.     Cranial Nerves: Cranial nerves are intact. No cranial nerve deficit, dysarthria or facial asymmetry.     Sensory: Sensation is intact.  No sensory deficit.     Motor: Motor function is intact. No weakness, tremor, atrophy, abnormal muscle tone or seizure activity.     Coordination: Coordination is intact. Coordination normal.     Gait: Gait is intact. Gait normal.     Comments: Gait sure and steady in hallway; in/out of chair and on/off exam table without difficulty  Psychiatric:        Attention and Perception: Attention and perception normal.        Mood and Affect: Mood and affect normal.        Speech: Speech normal.        Behavior: Behavior normal. Behavior is cooperative.        Thought Content: Thought content normal.        Cognition and Memory: Cognition and memory normal.        Judgment: Judgment normal.           Assessment & Plan:  A-viral URI with cough, diarrhea  P-tamiflu 75mg  po BID x 5 days #10 RF0 paper Rx given to patient she is unsure which pharmacy she is going to utilize.  We do not have flu testing available at this clinic.  6 known cases in employees + at [facility]. Discussed type B increased prevalence in community and flu vaccination coverage type A. There has been diarrhea in coworker population also less than 10 presented to clinic in the past month. Prednisone 10mg   (40mg x3days) po daily with breakfast has pills remaining at home from last PDRx dispense to use.  OTC robitussin/delsym po prn cough.  Has albuterol inhaler at home for prn use 1-2 puffs po q4-6h prn wheezing/protracted cough.  Refused tessalon pearls Rx or cough drops from clinic stock.  Trial phenylephrine 5mg  po q4-6h prn rhinitis discussed it can raise heartrate/blood pressure/cause drowsiness and/or insomnia.  "cousin of sudafed"  Increase use of nasal saline currently only using at home and exposed to a lot of dust at work.  Consider mask use at work.  Try to stay inside as much as possible due to pollen/wind/thunderstorms in area.  Rest/hydrate/bland diet due to stomach upset yesterday discussed probably viral illness related. May  use loperamide 2mg  take one with onset diarrhea and may repeat with next stool max 8 tabs in 24 hours.  Hydrate  hydrate hydrate with noncaffeinated beverages.  Dairy products can increase mucous production I recommend honey with lemon, soup broths, ginger ale, no sugar added powerade/gatorade.  To keep urine pale clear and yellow tinged.  If gold/amber dehydrated drink more water.   Exitcare handout influenza and viral gastroenteritis.   I have recommended clear fluids and bland diet.  Avoid dairy/spicy, fried and large portions of meat while having nausea.  If vomiting hold po intake x 1 hour.  Then sips clear fluids like broths, ginger ale, power ade, gatorade, pedialyte may advance to soft/bland if no vomiting x 24 hours and appetite returned otherwise hydration main focus.Return to the clinic if symptoms persist or worsen; I have alerted the patient to call if high fever, dehydration, marked weakness, fainting, increased abdominal pain, blood in stool or vomit (red or black).  Patient verbalized agreement and understanding of treatment plan and had no further questions at this time.   Continue singulair 10mg  po qhs.  Patient may use normal saline nasal spray 2 sprays each nostril q2h wa as needed. flonase 49mcg 1 spray each nostril BID   Patient denied personal or family history of ENT cancer.  OTC antihistamine of choice xyzal 5mg  po daily.  Avoid triggers if possible. Do not sleep with windows open.   Shower prior to bedtime.  If allergic dust/dust mites recommend mattress/pillow covers/encasements; washing linens, vacuuming, sweeping, dusting weekly.  Call or return to clinic as needed if these symptoms worsen or fail to improve as anticipated.   Exitcare handout on viral URI/influenzagiven to patient.  Patient verbalized understanding of instructions, agreed with plan of care and had no further questions at this time.  P2:  Avoidance and hand washing.

## 2018-09-10 NOTE — Patient Instructions (Signed)
Influenza, Adult Influenza, more commonly known as "the flu," is a viral infection that mainly affects the respiratory tract. The respiratory tract includes organs that help you breathe, such as the lungs, nose, and throat. The flu causes many symptoms similar to the common cold along with high fever and body aches. The flu spreads easily from person to person (is contagious). Getting a flu shot (influenza vaccination) every year is the best way to prevent the flu. What are the causes? This condition is caused by the influenza virus. You can get the virus by:  Breathing in droplets that are in the air from an infected person's cough or sneeze.  Touching something that has been exposed to the virus (has been contaminated) and then touching your mouth, nose, or eyes. What increases the risk? The following factors may make you more likely to get the flu:  Not washing or sanitizing your hands often.  Having close contact with many people during cold and flu season.  Touching your mouth, eyes, or nose without first washing or sanitizing your hands.  Not getting a yearly (annual) flu shot. You may have a higher risk for the flu, including serious problems such as a lung infection (pneumonia), if you:  Are older than 65.  Are pregnant.  Have a weakened disease-fighting system (immune system). You may have a weakened immune system if you: ? Have HIV or AIDS. ? Are undergoing chemotherapy. ? Are taking medicines that reduce (suppress) the activity of your immune system.  Have a long-term (chronic) illness, such as heart disease, kidney disease, diabetes, or lung disease.  Have a liver disorder.  Are severely overweight (morbidly obese).  Have anemia. This is a condition that affects your red blood cells.  Have asthma. What are the signs or symptoms? Symptoms of this condition usually begin suddenly and last 4-14 days. They may include:  Fever and chills.  Headaches, body aches, or  muscle aches.  Sore throat.  Cough.  Runny or stuffy (congested) nose.  Chest discomfort.  Poor appetite.  Weakness or fatigue.  Dizziness.  Nausea or vomiting. How is this diagnosed? This condition may be diagnosed based on:  Your symptoms and medical history.  A physical exam.  Swabbing your nose or throat and testing the fluid for the influenza virus. How is this treated? If the flu is diagnosed early, you can be treated with medicine that can help reduce how severe the illness is and how long it lasts (antiviral medicine). This may be given by mouth (orally) or through an IV. Taking care of yourself at home can help relieve symptoms. Your health care provider may recommend:  Taking over-the-counter medicines.  Drinking plenty of fluids. In many cases, the flu goes away on its own. If you have severe symptoms or complications, you may be treated in a hospital. Follow these instructions at home: Activity  Rest as needed and get plenty of sleep.  Stay home from work or school as told by your health care provider. Unless you are visiting your health care provider, avoid leaving home until your fever has been gone for 24 hours without taking medicine. Eating and drinking  Take an oral rehydration solution (ORS). This is a drink that is sold at pharmacies and retail stores.  Drink enough fluid to keep your urine pale yellow.  Drink clear fluids in small amounts as you are able. Clear fluids include water, ice chips, diluted fruit juice, and low-calorie sports drinks.  Eat bland, easy-to-digest foods   in small amounts as you are able. These foods include bananas, applesauce, rice, lean meats, toast, and crackers.  Avoid drinking fluids that contain a lot of sugar or caffeine, such as energy drinks, regular sports drinks, and soda.  Avoid alcohol.  Avoid spicy or fatty foods. General instructions      Take over-the-counter and prescription medicines only as told  by your health care provider.  Use a cool mist humidifier to add humidity to the air in your home. This can make it easier to breathe.  Cover your mouth and nose when you cough or sneeze.  Wash your hands with soap and water often, especially after you cough or sneeze. If soap and water are not available, use alcohol-based hand sanitizer.  Keep all follow-up visits as told by your health care provider. This is important. How is this prevented?   Get an annual flu shot. You may get the flu shot in late summer, fall, or winter. Ask your health care provider when you should get your flu shot.  Avoid contact with people who are sick during cold and flu season. This is generally fall and winter. Contact a health care provider if:  You develop new symptoms.  You have: ? Chest pain. ? Diarrhea. ? A fever.  Your cough gets worse.  You produce more mucus.  You feel nauseous or you vomit. Get help right away if:  You develop shortness of breath or difficulty breathing.  Your skin or nails turn a bluish color.  You have severe pain or stiffness in your neck.  You develop a sudden headache or sudden pain in your face or ear.  You cannot eat or drink without vomiting. Summary  Influenza, more commonly known as "the flu," is a viral infection that primarily affects your respiratory tract.  Symptoms of the flu usually begin suddenly and last 4-14 days.  Getting an annual flu shot is the best way to prevent getting the flu.  Stay home from work or school as told by your health care provider. Unless you are visiting your health care provider, avoid leaving home until your fever has been gone for 24 hours without taking medicine.  Keep all follow-up visits as told by your health care provider. This is important. This information is not intended to replace advice given to you by your health care provider. Make sure you discuss any questions you have with your health care  provider. Document Released: 06/23/2000 Document Revised: 12/12/2017 Document Reviewed: 12/12/2017 Elsevier Interactive Patient Education  2019 [employer] Viral Gastroenteritis, Adult  Viral gastroenteritis is also known as the stomach flu. This condition is caused by various viruses. These viruses can be passed from person to person very easily (are very contagious). This condition may affect your stomach, small intestine, and large intestine. It can cause sudden watery diarrhea, fever, and vomiting. Diarrhea and vomiting can make you feel weak and cause you to become dehydrated. You may not be able to keep fluids down. Dehydration can make you tired and thirsty, cause you to have a dry mouth, and decrease how often you urinate. Older adults and people with other diseases or a weak immune system are at higher risk for dehydration. It is important to replace the fluids that you lose from diarrhea and vomiting. If you become severely dehydrated, you may need to get fluids through an IV tube. What are the causes? Gastroenteritis is caused by various viruses, including rotavirus and norovirus. Norovirus is the most common cause  in adults. You can get sick by eating food, drinking water, or touching a surface contaminated with one of these viruses. You can also get sick from sharing utensils or other personal items with an infected person. What increases the risk? This condition is more likely to develop in people:  Who have a weak defense system (immune system).  Who live with one or more children who are younger than 50 years old.  Who live in a nursing home.  Who go on cruise ships. What are the signs or symptoms? Symptoms of this condition start suddenly 1-2 days after exposure to a virus. Symptoms may last a few days or as long as a week. The most common symptoms are watery diarrhea and vomiting. Other symptoms include:  Fever.  Headache.  Fatigue.  Pain in the  abdomen.  Chills.  Weakness.  Nausea.  Muscle aches.  Loss of appetite. How is this diagnosed? This condition is diagnosed with a medical history and physical exam. You may also have a stool test to check for viruses or other infections. How is this treated? This condition typically goes away on its own. The focus of treatment is to restore lost fluids (rehydration). Your health care provider may recommend that you take an oral rehydration solution (ORS) to replace important salts and minerals (electrolytes) in your body. Severe cases of this condition may require giving fluids through an IV tube. Treatment may also include medicine to help with your symptoms. Follow these instructions at home:  Follow instructions from your health care provider about how to care for yourself at home. Follow these recommendations as told by your health care provider:  Take an ORS. This is a drink that is sold at pharmacies and retail stores.  Drink clear fluids in small amounts as you are able. Clear fluids include water, ice chips, diluted fruit juice, and low-calorie sports drinks.  Eat bland, easy-to-digest foods in small amounts as you are able. These foods include bananas, applesauce, rice, lean meats, toast, and crackers.  Avoid fluids that contain a lot of sugar or caffeine, such as energy drinks, sports drinks, and soda.  Avoid alcohol.  Avoid spicy or fatty foods. General instructions  Drink enough fluid to keep your urine clear or pale yellow.  Wash your hands often. If soap and water are not available, use hand sanitizer.  Make sure that all people in your household wash their hands well and often.  Take over-the-counter and prescription medicines only as told by your health care provider.  Rest at home while you recover.  Watch your condition for any changes.  Take a warm bath to relieve any burning or pain from frequent diarrhea episodes.  Keep all follow-up visits as told  by your health care provider. This is important. Contact a health care provider if:  You cannot keep fluids down.  Your symptoms get worse.  You have new symptoms.  You feel light-headed or dizzy.  You have muscle cramps. Get help right away if:  You have chest pain.  You feel extremely weak or you faint.  You see blood in your vomit.  Your vomit looks like coffee grounds.  You have bloody or black stools or stools that look like tar.  You have a severe headache, a stiff neck, or both.  You have a rash.  You have severe pain, cramping, or bloating in your abdomen.  You have trouble breathing or you are breathing very quickly.  Your heart is beating very  quickly.  Your skin feels cold and clammy.  You feel confused.  You have pain when you urinate.  You have signs of dehydration, such as: ? Dark urine, very little urine, or no urine. ? Cracked lips. ? Dry mouth. ? Sunken eyes. ? Sleepiness. ? Weakness. This information is not intended to replace advice given to you by your health care provider. Make sure you discuss any questions you have with your health care provider. Document Released: 06/26/2005 Document Revised: 02/08/2017 Document Reviewed: 03/02/2015 Elsevier Interactive Patient Education  2019 [place]. Viral Respiratory Infection A respiratory infection is an illness that affects part of the respiratory system, such as the lungs, nose, or throat. A respiratory infection that is caused by a virus is called a viral respiratory infection. Common types of viral respiratory infections include:  A cold.  The flu (influenza).  A respiratory syncytial virus (RSV) infection. What are the causes? This condition is caused by a virus. What are the signs or symptoms? Symptoms of this condition include:  A stuffy or runny nose.  Yellow or green nasal discharge.  A cough.  Sneezing.  Fatigue.  Achy muscles.  A sore throat.  Sweating or  chills.  A fever.  A headache. How is this diagnosed? This condition may be diagnosed based on:  Your symptoms.  A physical exam.  Testing of nasal swabs. How is this treated? This condition may be treated with medicines, such as:  Antiviral medicine. This may shorten the length of time a person has symptoms.  Expectorants. These make it easier to cough up mucus.  Decongestant nasal sprays.  Acetaminophen or NSAIDs to relieve fever and pain. Antibiotic medicines are not prescribed for viral infections. This is because antibiotics are designed to kill bacteria. They are not effective against viruses. Follow these instructions at home:  Managing pain and congestion  Take over-the-counter and prescription medicines only as told by your health care provider.  If you have a sore throat, gargle with a salt-water mixture 3-4 times a day or as needed. To make a salt-water mixture, completely dissolve -1 tsp of salt in 1 cup of warm water.  Use nose drops made from salt water to ease congestion and soften raw skin around your nose.  Drink enough fluid to keep your urine pale yellow. This helps prevent dehydration and helps loosen up mucus. General instructions  Rest as much as possible.  Do not drink alcohol.  Do not use any products that contain nicotine or tobacco, such as cigarettes and e-cigarettes. If you need help quitting, ask your health care provider.  Keep all follow-up visits as told by your health care provider. This is important. How is this prevented?   Get an annual flu shot. You may get the flu shot in late summer, fall, or winter. Ask your health care provider when you should get your flu shot.  Avoid exposing others to your respiratory infection. ? Stay home from work or school as told by your health care provider. ? Wash your hands with soap and water often, especially after you cough or sneeze. If soap and water are not available, use alcohol-based hand  sanitizer.  Avoid contact with people who are sick during cold and flu season. This is generally fall and winter. Contact a health care provider if:  Your symptoms last for 10 days or longer.  Your symptoms get worse over time.  You have a fever.  You have severe sinus pain in your face or  forehead.  The glands in your jaw or neck become very swollen. Get help right away if you:  Feel pain or pressure in your chest.  Have shortness of breath.  Faint or feel like you will faint.  Have severe and persistent vomiting.  Feel confused or disoriented. Summary  A respiratory infection is an illness that affects part of the respiratory system, such as the lungs, nose, or throat. A respiratory infection that is caused by a virus is called a viral respiratory infection.  Common types of viral respiratory infections are a cold, influenza, and respiratory syncytial virus (RSV) infection.  Symptoms of this condition include a stuffy or runny nose, cough, sneezing, fatigue, achy muscles, sore throat, and fevers or chills.  Antibiotic medicines are not prescribed for viral infections. This is because antibiotics are designed to kill bacteria. They are not effective against viruses. This information is not intended to replace advice given to you by your health care provider. Make sure you discuss any questions you have with your health care provider. Document Released: 04/05/2005 Document Revised: 08/06/2017 Document Reviewed: 08/06/2017 Elsevier Interactive Patient Education  2019 [employer].

## 2018-09-12 ENCOUNTER — Ambulatory Visit: Payer: Self-pay | Admitting: Registered Nurse

## 2018-09-12 ENCOUNTER — Encounter: Payer: Self-pay | Admitting: Registered Nurse

## 2018-09-12 VITALS — BP 149/88 | HR 93 | Temp 98.2°F

## 2018-09-12 DIAGNOSIS — J019 Acute sinusitis, unspecified: Secondary | ICD-10-CM

## 2018-09-12 DIAGNOSIS — H6983 Other specified disorders of Eustachian tube, bilateral: Secondary | ICD-10-CM

## 2018-09-12 DIAGNOSIS — J040 Acute laryngitis: Secondary | ICD-10-CM

## 2018-09-12 NOTE — Progress Notes (Signed)
Subjective:    Patient ID: Kelly Reed, female    DOB: Feb 23, 1966, 53 y.o.   MRN: [id number]  52y/o Caucasian established femle pt presenting for f/u of viral URI. C/o con't chest congestion, nonproductive cough, frontal and maxillary sinus pain and pressure, bilateral otalgia, HA, scratchy throat. Still with body aches and fatigue. Trying to stay hydrated. Drank half gallon of water and 40oz Gatorade G2. Started Tamiflu, prednisone Tuesday afternoon (48 hours ago) and still taking breo, flonase, xyzal, saline nasal couple times per day, phenylephrine couple times per day and singulair. Last used albuterol MDI 0700 today.  Hasn't had any more diarrhea but noticed increased stomach gurgling after she came to work this am.  Didn't start immodium prn.   Voice now hoarse.  Stayed home from work yesterday was feeling better this am so came in.  Notified that employer has new sick policy due to increased flu and coronavirus in local area  Employees may stay home with URI symptoms patient planning to go home after clinic appt today with new work attendance exception policy this month started yesterday.     Review of Systems  Constitutional: Positive for fatigue. Negative for activity change and appetite change.  HENT: Positive for congestion, ear pain, postnasal drip, rhinorrhea, sinus pressure, sinus pain and voice change. Negative for drooling, ear discharge, facial swelling, hearing loss, mouth sores, nosebleeds, sneezing, sore throat, tinnitus and trouble swallowing.   Eyes: Negative for photophobia, pain, discharge, redness, itching and visual disturbance.  Respiratory: Positive for cough. Negative for shortness of breath, wheezing and stridor.   Cardiovascular: Negative for chest pain, palpitations and leg swelling.  Gastrointestinal: Negative for abdominal distention, abdominal pain, blood in stool, constipation, diarrhea, nausea and vomiting.  Endocrine: Negative for cold intolerance and heat  intolerance.  Genitourinary: Negative for difficulty urinating.  Musculoskeletal: Positive for myalgias. Negative for gait problem, joint swelling, neck pain and neck stiffness.  Allergic/Immunologic: Positive for environmental allergies.  Neurological: Positive for headaches. Negative for dizziness, tremors, seizures, syncope, facial asymmetry, speech difficulty, weakness, light-headedness and numbness.  Hematological: Negative for adenopathy. Does not bruise/bleed easily.  Psychiatric/Behavioral: Negative for sleep disturbance.       Objective:   Physical Exam Vitals signs and nursing note reviewed.  Constitutional:      General: She is awake. She is not in acute distress.    Appearance: Normal appearance. She is well-developed and well-groomed. She is ill-appearing. She is not toxic-appearing or diaphoretic.  HENT:     Head: Normocephalic and atraumatic.     Jaw: There is normal jaw occlusion. No trismus.     Salivary Glands: Right salivary gland is not diffusely enlarged or tender. Left salivary gland is not diffusely enlarged or tender.     Right Ear: Hearing, ear canal and external ear normal. A middle ear effusion is present. There is no impacted cerumen.     Left Ear: Hearing, ear canal and external ear normal. A middle ear effusion is present. There is no impacted cerumen.     Nose: Mucosal edema, congestion and rhinorrhea present. No nasal deformity, septal deviation or laceration.     Right Turbinates: Enlarged and swollen. Not pale.     Left Turbinates: Enlarged and swollen. Not pale.     Right Sinus: Frontal sinus tenderness present. No maxillary sinus tenderness.     Left Sinus: Frontal sinus tenderness present. No maxillary sinus tenderness.     Mouth/Throat:     Lips: Pink. No lesions.  Mouth: Mucous membranes are moist. Mucous membranes are not pale, not dry and not cyanotic. No lacerations, oral lesions or angioedema.     Dentition: Normal dentition. Does not have  dentures. No dental caries or dental abscesses.     Tongue: No lesions.     Pharynx: Uvula midline. Pharyngeal swelling and posterior oropharyngeal erythema present. No oropharyngeal exudate or uvula swelling.     Tonsils: No tonsillar exudate or tonsillar abscesses. Swelling: 0 on the right. 0 on the left.     Comments: Bilateral allergic shiners; cobblestoning posterior pharynx; bilateral nasal turbinates edema erythema clear discharge but less swelling noted compared to Tuesday 48 hours previous; bilateral TMs air fluid level clear; frequent nasal sniffing blowing nose in exam room noted Eyes:     General: Lids are normal. Allergic shiner present. No visual field deficit or scleral icterus.       Right eye: No foreign body, discharge or hordeolum.        Left eye: No foreign body, discharge or hordeolum.     Extraocular Movements: Extraocular movements intact.     Right eye: Normal extraocular motion and no nystagmus.     Left eye: Normal extraocular motion and no nystagmus.     Conjunctiva/sclera: Conjunctivae normal.     Right eye: Right conjunctiva is not injected. No chemosis, exudate or hemorrhage.    Left eye: Left conjunctiva is not injected. No chemosis, exudate or hemorrhage.    Pupils: Pupils are equal, round, and reactive to light. Pupils are equal.     Right eye: Pupil is round and reactive.     Left eye: Pupil is round and reactive.  Neck:     Musculoskeletal: Normal range of motion and neck supple. Normal range of motion. No edema, erythema, neck rigidity, crepitus, injury, pain with movement, torticollis, spinous process tenderness or muscular tenderness.     Thyroid: No thyroid mass or thyromegaly.     Trachea: Trachea normal. No tracheal tenderness or tracheal deviation.     Comments: Hoarse voice Cardiovascular:     Rate and Rhythm: Normal rate and regular rhythm.     Chest Wall: PMI is not displaced.     Pulses: Normal pulses.     Heart sounds: Normal heart sounds, S1  normal and S2 normal. No murmur. No friction rub. No gallop.   Pulmonary:     Effort: Pulmonary effort is normal. No accessory muscle usage or respiratory distress.     Breath sounds: Normal breath sounds and air entry. No stridor, decreased air movement or transmitted upper airway sounds. No decreased breath sounds, wheezing, rhonchi or rales.     Comments: Spoke full sentences without difficulty; intermittent nonproductive cough in exam room Chest:     Chest wall: No tenderness.  Abdominal:     General: Abdomen is flat. Bowel sounds are normal. There is no distension.     Palpations: Abdomen is soft. There is no shifting dullness, fluid wave, hepatomegaly, splenomegaly, mass or pulsatile mass.     Tenderness: There is no abdominal tenderness. There is no right CVA tenderness, left CVA tenderness, guarding or rebound. Negative signs include Murphy's sign.     Hernia: No hernia is present.     Comments: Dull to percussion x 4 quads; normoactive bowel sounds x 4 quads; standing to sitting to supine and reverse quickly on exam table without difficulty or assist  Musculoskeletal: Normal range of motion.        General: No tenderness.  Right shoulder: Normal.     Left shoulder: Normal.     Right elbow: Normal.    Left elbow: Normal.     Right hip: Normal.     Left hip: Normal.     Right knee: Normal.     Left knee: Normal.     Cervical back: Normal.     Thoracic back: Normal.     Lumbar back: Normal.     Right hand: Normal.     Left hand: Normal.     Right lower leg: No edema.     Left lower leg: No edema.  Lymphadenopathy:     Head:     Right side of head: No submental, submandibular, tonsillar, preauricular, posterior auricular or occipital adenopathy.     Left side of head: No submental, submandibular, tonsillar, preauricular, posterior auricular or occipital adenopathy.     Cervical: No cervical adenopathy.     Right cervical: No superficial, deep or posterior cervical  adenopathy.    Left cervical: No superficial, deep or posterior cervical adenopathy.  Skin:    General: Skin is warm.     Capillary Refill: Capillary refill takes less than 2 seconds.     Coloration: Skin is not ashen, cyanotic, jaundiced, mottled, pale or sallow.     Findings: No abrasion, abscess, acne, bruising, burn, ecchymosis, erythema, laceration, lesion, petechiae, rash or wound.     Nails: There is no clubbing.      Comments: Facial skin slightly damp to touch no diaphoresis  Neurological:     General: No focal deficit present.     Mental Status: She is alert and oriented to person, place, and time. Mental status is at baseline. She is not disoriented.     GCS: GCS eye subscore is 4. GCS verbal subscore is 5. GCS motor subscore is 6.     Cranial Nerves: Cranial nerves are intact. No cranial nerve deficit, dysarthria or facial asymmetry.     Sensory: Sensation is intact. No sensory deficit.     Motor: Motor function is intact. No weakness, tremor, atrophy, abnormal muscle tone or seizure activity.     Coordination: Coordination is intact. Coordination normal.     Gait: Gait is intact. Gait normal.     Comments: Gait sure and steady in hallway; on/off exam table and in/out of chair without difficulty  Psychiatric:        Attention and Perception: Attention and perception normal.        Mood and Affect: Mood and affect normal.        Speech: Speech normal.        Behavior: Behavior normal. Behavior is cooperative.        Thought Content: Thought content normal.        Cognition and Memory: Cognition and memory normal.        Judgment: Judgment normal.           Assessment & Plan:  A-acute rhinosinusitis, eustachian tube dysfunction bilateral, laryngitis  P-Continue plan of care as discussed office visit 09/10/2018.  Discussed cold weather front pushing through area with thunderstorms today not helping with sinus pain/pressure again, stay inside out of wind.  Consider mask  in her dusty work area.  Use her nasal saline at work when congested.  Continue breo, albuterol prn, tamiflu and prednisone.  Rest, hydrate I agree with going home to rest today versus staying at work while sick.  Patient to talk with her supervisor.  Give prednisone a  couple more days but if worsening sinus pain/pressure and discharge cloudy tan/rose colored start azithromycin 250mg  sig t2 day 1 then take 1 tab days 2-5 #6 RF0 dispensed from [facility] to patient today.  Discussed caution with immodium and azithromycin combined use can cause QT prolongation/arrhthmias/palpitations per [facility].  If these occur stop immodium use and notify me  Discussed with patient azithromycin has a long half life 96 hours. Continue flonase 1 spray each nostril BID, saline 2 sprays each nostril q2h wa prn congestion (increase her frequency use while awake if sinus pain/pressure continuing).  May continue phenylephrine 5-10mg  po q6h prn rhinitis OTC.  Hydrate hydrate hydrate with noncaffeinated beverages  Continue singulair 10mg  po qhs and xyzal 5mg  po qam.  Denied personal or family history of ENT cancer.  Shower BID especially prior to bed. No evidence of systemic bacterial infection, non toxic and well hydrated.  I do not see where any further testing or imaging is necessary at this time.  May use OTC cough medicine e.g. robitussin/delsyn or nyquil per manufacturer's instructions.  Honey with lemon.  Soup broths salty not creamy.   I will suggest supportive care, rest, good hygiene and encourage the patient to take adequate fluids.  The patient is to return to clinic or EMERGENCY ROOM if symptoms worsen or change significantly.  Exitcare handout on laryngitis, sinusitis and sinus rinse.  Patient verbalized agreement and understanding of treatment plan and had no further questions at this time.   P2:  Hand washing and cover cough  Need to dry up postnasal drip so middle ears can drain again typically takes 30 days once post nasal  drip dried up.  Hoarse voice from vocal cord irritation from postnasal drip.  Tylenol 1000mg  po qid prn pain.  Supportive treatment.   No evidence of invasive bacterial infection, non toxic and well hydrated.  This is most likely self limiting viral infection.  I do not see where any further testing or imaging is necessary at this time.   I will suggest supportive care, rest, good hygiene and encourage the patient to take adequate fluids.  The patient is to return to clinic or EMERGENCY ROOM if symptoms worsen or change significantly e.g. ear pain, fever, purulent discharge from ears or bleeding.  Exitcare handout on eustachian tube dysfunction.  Patient verbalized agreement and understanding of treatment plan.

## 2018-09-13 MED ORDER — SALINE SPRAY 0.65 % NA SOLN: 2.0000 | NASAL | 0 refills | Status: DC

## 2018-09-13 NOTE — Patient Instructions (Addendum)
Laryngitis    Laryngitis is inflammation of the vocal cords that causes symptoms such as hoarseness or loss of voice. The vocal cords are two bands of muscles in your throat. When you speak, these cords come together and vibrate. The vibrations come out through your mouth as sound. When your vocal cords are inflamed, your voice sounds different.  Laryngitis can be temporary (acute) or long-term (chronic). Most cases of acute laryngitis improve with time. Chronic laryngitis is laryngitis that lasts for more than 3 weeks.  What are the causes?  Acute laryngitis may be caused by:   A viral infection.   Lots of talking, yelling, or singing. This is also called vocal strain.   A bacterial infection.  Chronic laryngitis may be caused by:   Vocal strain.   Injury to your vocal cords.   Acid reflux (gastroesophageal reflux disease, or GERD).   Allergies.   A sinus infection.   Smoking.   Alcohol abuse.   Breathing in chemicals or dust.   Growths on the vocal cords.  What increases the risk?  The following factors may make you more likely to develop this condition:   Smoking.   Alcohol abuse.   Having allergies.   Chronic irritants in the workplace, such as toxic fumes.  What are the signs or symptoms?  Symptoms of this condition may include:   Low, hoarse voice.   Loss of voice.   Dry cough.   Sore or dry throat.   Stuffy nose.  How is this diagnosed?  This condition may be diagnosed based on:   Your symptoms and a physical exam.   Throat culture.   Blood test.   A procedure in which your health care provider looks at your vocal cords with a mirror or viewing tube (laryngoscopy).  How is this treated?  Treatment for laryngitis depends on what is causing it.   Usually, treatment involves resting your voice and using medicines to soothe your throat.   If your laryngitis is caused by a bacterial infection, you may need to take antibiotic medicine.   If your laryngitis is caused by a growth, you may  need to have a procedure to remove it.  Follow these instructions at home:  Medicines   Take over-the-counter and prescription medicines only as told by your health care provider.   If you were prescribed an antibiotic medicine, take it as told by your health care provider. Do not stop taking the antibiotic even if you start to feel better.  General instructions   Talk as little as possible. Also avoid whispering, which can cause vocal strain.   Write instead of talking. Do this until your voice is back to normal.   Drink enough fluid to keep your urine pale yellow.   Breathe in moist air. Use a humidifier if you live in a dry climate.   Do not use any products that contain nicotine or tobacco, such as cigarettes and e-cigarettes. If you need help quitting, ask your health care provider.  Contact a health care provider if:   You have a fever.   You have increasing pain.   Your symptoms do not get better in 2 weeks.  Get help right away if:   You cough up blood.   You have difficulty swallowing.   You have trouble breathing.  Summary   Laryngitis is inflammation of the vocal cords that causes symptoms such as hoarseness or loss of voice.   Laryngitis can be temporary (  Elsevier Interactive Patient Education  [employer]. Sinusitis, Adult Sinusitis is inflammation of your sinuses. Sinuses are hollow spaces in the bones around your face. Your sinuses are located:  Around your eyes.  In the middle of your forehead.  Behind your nose.  In your cheekbones. Mucus normally  drains out of your sinuses. When your nasal tissues become inflamed or swollen, mucus can become trapped or blocked. This allows bacteria, viruses, and fungi to grow, which leads to infection. Most infections of the sinuses are caused by a virus. Sinusitis can develop quickly. It can last for up to 4 weeks (acute) or for more than 12 weeks (chronic). Sinusitis often develops after a cold. What are the causes? This condition is caused by anything that creates swelling in the sinuses or stops mucus from draining. This includes:  Allergies.  Asthma.  Infection from bacteria or viruses.  Deformities or blockages in your nose or sinuses.  Abnormal growths in the nose (nasal polyps).  Pollutants, such as chemicals or irritants in the air.  Infection from fungi (rare). What increases the risk? You are more likely to develop this condition if you:  Have a weak body defense system (immune system).  Do a lot of swimming or diving.  Overuse nasal sprays.  Smoke. What are the signs or symptoms? The main symptoms of this condition are pain and a feeling of pressure around the affected sinuses. Other symptoms include:  Stuffy nose or congestion.  Thick drainage from your nose.  Swelling and warmth over the affected sinuses.  Headache.  Upper toothache.  A cough that may get worse at night.  Extra mucus that collects in the throat or the back of the nose (postnasal drip).  Decreased sense of smell and taste.  Fatigue.  A fever.  Sore throat.  Bad breath. How is this diagnosed? This condition is diagnosed based on:  Your symptoms.  Your medical history.  A physical exam.  Tests to find out if your condition is acute or chronic. This may include: ? Checking your nose for nasal polyps. ? Viewing your sinuses using a device that has a light (endoscope). ? Testing for allergies or bacteria. ? Imaging tests, such as an MRI or CT scan. In rare cases, a bone biopsy may be  done to rule out more serious types of fungal sinus disease. How is this treated? Treatment for sinusitis depends on the cause and whether your condition is chronic or acute.  If caused by a virus, your symptoms should go away on their own within 10 days. You may be given medicines to relieve symptoms. They include: ? Medicines that shrink swollen nasal passages (topical intranasal decongestants). ? Medicines that treat allergies (antihistamines). ? A spray that eases inflammation of the nostrils (topical intranasal corticosteroids). ? Rinses that help get rid of thick mucus in your nose (nasal saline washes).  If caused by bacteria, your health care provider may recommend waiting to see if your symptoms improve. Most bacterial infections will get better without antibiotic medicine. You may be given antibiotics if you have: ? A severe infection. ? A weak immune system.  If caused by narrow nasal passages or nasal polyps, you may need to have surgery. Follow these instructions at home: Medicines  Take, use, or apply over-the-counter and prescription medicines only as told by your health care provider. These may include nasal sprays.  If you were prescribed an antibiotic medicine, take it as told by your health care provider. Do  not stop taking the antibiotic even if you start to feel better. Hydrate and humidify   Drink enough fluid to keep your urine pale yellow. Staying hydrated will help to thin your mucus.  Use a cool mist humidifier to keep the humidity level in your home above 50%.  Inhale steam for 10-15 minutes, 3-4 times a day, or as told by your health care provider. You can do this in the bathroom while a hot shower is running.  Limit your exposure to cool or dry air. Rest  Rest as much as possible.  Sleep with your head raised (elevated).  Make sure you get enough sleep each night. General instructions   Apply a warm, moist washcloth to your face 3-4 times a day or  as told by your health care provider. This will help with discomfort.  Wash your hands often with soap and water to reduce your exposure to germs. If soap and water are not available, use hand sanitizer.  Do not smoke. Avoid being around people who are smoking (secondhand smoke).  Keep all follow-up visits as told by your health care provider. This is important. Contact a health care provider if:  You have a fever.  Your symptoms get worse.  Your symptoms do not improve within 10 days. Get help right away if:  You have a severe headache.  You have persistent vomiting.  You have severe pain or swelling around your face or eyes.  You have vision problems.  You develop confusion.  Your neck is stiff.  You have trouble breathing. Summary  Sinusitis is soreness and inflammation of your sinuses. Sinuses are hollow spaces in the bones around your face.  This condition is caused by nasal tissues that become inflamed or swollen. The swelling traps or blocks the flow of mucus. This allows bacteria, viruses, and fungi to grow, which leads to infection.  If you were prescribed an antibiotic medicine, take it as told by your health care provider. Do not stop taking the antibiotic even if you start to feel better.  Keep all follow-up visits as told by your health care provider. This is important. This information is not intended to replace advice given to you by your health care provider. Make sure you discuss any questions you have with your health care provider. Document Released: 06/26/2005 Document Revised: 11/26/2017 Document Reviewed: 11/26/2017 Elsevier Interactive Patient Education  2019 [employer]. How to Perform a Sinus Rinse A sinus rinse is a home treatment that is used to rinse your sinuses with a sterile mixture of salt and water (saline solution). Sinuses are air-filled spaces in your skull behind the bones of your face and forehead that open into your nasal cavity. A  sinus rinse can help to clear mucus, dirt, dust, or pollen from your nasal cavity. You may do a sinus rinse when you have a cold, a virus, nasal allergy symptoms, a sinus infection, or stuffiness in your nose or sinuses. Talk with your health care provider about whether a sinus rinse might help you. What are the risks? A sinus rinse is generally safe and effective. However, there are a few risks, which include:  A burning sensation in your sinuses. This may happen if you do not make the saline solution as directed. Be sure to follow all directions when making the saline solution.  Nasal irritation.  Infection from contaminated water. This is rare, but possible. Do not do a sinus rinse if you have had ear or nasal surgery, ear  infection, or blocked ears. Supplies needed:  Saline solution or powder.  Distilled or sterile water may be needed to mix with saline powder. ? You may use boiled and cooled tap water. Boil tap water for 5 minutes; cool until it is lukewarm. Use within 24 hours. ? Do not use regular tap water to mix with the saline solution.  Neti pot or nasal rinse bottle. These supplies release the saline solution into your nose and through your sinuses. Neti pots and nasal rinse bottles can be purchased at [employer], a health food store, or online. How to perform a sinus rinse  1. Wash your hands with soap and water. 2. Wash your device according to the directions that came with the product and then dry it. 3. Use the solution that comes with your product or one that is sold separately in stores. Follow the mixing directions on the package if you need to mix with sterile or distilled water. 4. Fill the device with the amount of saline solution noted in the device instructions. 5. Stand over a sink and tilt your head sideways over the sink. 6. Place the spout of the device in your upper nostril (the one closer to the ceiling). 7. Gently pour or squeeze the saline solution  into your nasal cavity. The liquid should drain out from the lower nostril if you are not too congested. 8. While rinsing, breathe through your open mouth. 9. Gently blow your nose to clear any mucus and rinse solution. Blowing too hard may cause ear pain. 10. Repeat in your other nostril. 11. Clean and rinse your device with clean water and then air-dry it. Talk with your health care provider or pharmacist if you have questions about how to do a sinus rinse. Summary  A sinus rinse is a home treatment that is used to rinse your sinuses with a sterile mixture of salt and water (saline solution).  A sinus rinse is generally safe and effective. Follow all instructions carefully.  Before doing a sinus rinse, talk with your health care provider about whether it would be helpful for you. This information is not intended to replace advice given to you by your health care provider. Make sure you discuss any questions you have with your health care provider. Document Released: 01/21/2014 Document Revised: 04/23/2017 Document Reviewed: 04/23/2017 Elsevier Interactive Patient Education  2019 [place].  Eustachian Tube Dysfunction  Eustachian tube dysfunction refers to a condition in which a blockage develops in the narrow passage that connects the middle ear to the back of the nose (eustachian tube). The eustachian tube regulates air pressure in the middle ear by letting air move between the ear and nose. It also helps to drain fluid from the middle ear space. Eustachian tube dysfunction can affect one or both ears. When the eustachian tube does not function properly, air pressure, fluid, or both can build up in the middle ear. What are the causes? This condition occurs when the eustachian tube becomes blocked or cannot open normally. Common causes of this condition include:  Ear infections.  Colds and other infections that affect the nose, mouth, and throat (upper respiratory  tract).  Allergies.  Irritation from cigarette smoke.  Irritation from stomach acid coming up into the esophagus (gastroesophageal reflux). The esophagus is the tube that carries food from the mouth to the stomach.  Sudden changes in air pressure, such as from descending in an airplane or scuba diving.  Abnormal growths in the nose or  throat, such as: ? Growths that line the nose (nasal polyps). ? Abnormal growth of cells (tumors). ? Enlarged tissue at the back of the throat (adenoids). What increases the risk? You are more likely to develop this condition if:  You smoke.  You are overweight.  You are a child who has: ? Certain birth defects of the mouth, such as cleft palate. ? Large tonsils or adenoids. What are the signs or symptoms? Common symptoms of this condition include:  A feeling of fullness in the ear.  Ear pain.  Clicking or popping noises in the ear.  Ringing in the ear.  Hearing loss.  Loss of balance.  Dizziness. Symptoms may get worse when the air pressure around you changes, such as when you travel to an area of high elevation, fly on an airplane, or go scuba diving. How is this diagnosed? This condition may be diagnosed based on:  Your symptoms.  A physical exam of your ears, nose, and throat.  Tests, such as those that measure: ? The movement of your eardrum (tympanogram). ? Your hearing (audiometry). How is this treated? Treatment depends on the cause and severity of your condition.  In mild cases, you may relieve your symptoms by moving air into your ears. This is called "popping the ears."  In more severe cases, or if you have symptoms of fluid in your ears, treatment may include: ? Medicines to relieve congestion (decongestants). ? Medicines that treat allergies (antihistamines). ? Nasal sprays or ear drops that contain medicines that reduce swelling (steroids). ? A procedure to drain the fluid in your eardrum (myringotomy). In this  procedure, a small tube is placed in the eardrum to:  Drain the fluid.  Restore the air in the middle ear space. ? A procedure to insert a balloon device through the nose to inflate the opening of the eustachian tube (balloon dilation). Follow these instructions at home: Lifestyle  Do not do any of the following until your health care provider approves: ? Travel to high altitudes. ? Fly in airplanes. ? Work in a [employer] scheme manager or room. ? Scuba dive.  Do not use any products that contain nicotine or tobacco, such as cigarettes and e-cigarettes. If you need help quitting, ask your health care provider.  Keep your ears dry. Wear fitted earplugs during showering and bathing. Dry your ears completely after. General instructions  Take over-the-counter and prescription medicines only as told by your health care provider.  Use techniques to help pop your ears as recommended by your health care provider. These may include: ? Chewing gum. ? Yawning. ? Frequent, forceful swallowing. ? Closing your mouth, holding your nose closed, and gently blowing as if you are trying to blow air out of your nose.  Keep all follow-up visits as told by your health care provider. This is important. Contact a health care provider if:  Your symptoms do not go away after treatment.  Your symptoms come back after treatment.  You are unable to pop your ears.  You have: ? A fever. ? Pain in your ear. ? Pain in your head or neck. ? Fluid draining from your ear.  Your hearing suddenly changes.  You become very dizzy.  You lose your balance. Summary  Eustachian tube dysfunction refers to a condition in which a blockage develops in the eustachian tube.  It can be caused by ear infections, allergies, inhaled irritants, or abnormal growths in the nose or throat.  Symptoms include ear pain, hearing loss,  or ringing in the ears.  Mild cases are treated with maneuvers to unblock the ears, such as  yawning or ear popping.  Severe cases are treated with medicines. Surgery may also be done (rare). This information is not intended to replace advice given to you by your health care provider. Make sure you discuss any questions you have with your health care provider. Document Released: 07/23/2015 Document Revised: 10/16/2017 Document Reviewed: 10/16/2017 Elsevier Interactive Patient Education  2019 [employer].

## 2018-09-19 ENCOUNTER — Other Ambulatory Visit: Payer: Self-pay

## 2018-09-19 ENCOUNTER — Encounter: Payer: Self-pay | Admitting: Registered Nurse

## 2018-09-19 ENCOUNTER — Ambulatory Visit: Payer: Self-pay | Admitting: Registered Nurse

## 2018-09-19 VITALS — BP 164/94 | HR 80 | Temp 98.1°F

## 2018-09-19 DIAGNOSIS — J019 Acute sinusitis, unspecified: Secondary | ICD-10-CM

## 2018-09-19 DIAGNOSIS — J209 Acute bronchitis, unspecified: Secondary | ICD-10-CM

## 2018-09-19 MED ORDER — PREDNISONE 10 MG PO TABS: ORAL_TABLET | ORAL | 0 refills | Status: AC

## 2018-09-19 MED ORDER — ONDANSETRON HCL 4 MG PO TABS: 4.0000 mg | ORAL_TABLET | Freq: Two times a day (BID) | ORAL | 0 refills | Status: AC | PRN

## 2018-09-19 NOTE — Progress Notes (Signed)
Subjective:    Patient ID: Kelly Reed, female    DOB: 08-24-1965, 53 y.o.   MRN: [id number]  53y/o Caucasian established female pt reporting onset of sx this morning, very similar to previous sx she was seen in clinic for 3/3 and 09/12/18. C/o fever, body aches, diarrhea, nausea, headache, nonproductive cough. Last week ended up going to bed at 6pm and slept until 11.  Also started zinc coldeeze last week but had stopped wondering if she should restart. Felt well Sun-Wed. Sx started today (Thurs am). 7 epsiodes diarrhea today per pt. Last used albuterol MDI 0700 today.  Wasn't able to get allergy shot last week and none this week due to illness.  Took tamiflu felt better but worsening others in dept sick with viral uri and gastroenteritis also  Some nausea would like medication just in case this weekend and to restart prednisone  Ran out of hydromet and is to call ordering provider for refill as NP Zaylen Susman unable to fill controlled substances in this clinic per contract.  Has taken one immodium tab so far since last visit.  Two coworkers have left work early with similar symptoms in her dept this week     Review of Systems  Constitutional: Positive for activity change, fatigue and fever.  HENT: Positive for sinus pressure and sinus pain.   Eyes: Negative for photophobia and visual disturbance.  Respiratory: Positive for cough and wheezing.   Gastrointestinal: Positive for diarrhea and nausea.  Endocrine: Negative for cold intolerance and heat intolerance.  Musculoskeletal: Positive for myalgias.  Allergic/Immunologic: Positive for environmental allergies. Negative for food allergies.  Neurological: Positive for headaches.  Psychiatric/Behavioral: Positive for sleep disturbance. Negative for agitation and confusion.       Objective:   Physical Exam Vitals signs and nursing note reviewed.  Constitutional:      General: She is awake. She is not in acute distress.    Appearance: She is  well-developed and well-groomed. She is ill-appearing. She is not toxic-appearing or diaphoretic.  HENT:     Head: Normocephalic and atraumatic.     Jaw: There is normal jaw occlusion. No trismus.     Salivary Glands: Right salivary gland is not diffusely enlarged or tender. Left salivary gland is not diffusely enlarged or tender.     Right Ear: Hearing, ear canal and external ear normal. A middle ear effusion is present.     Left Ear: Hearing, ear canal and external ear normal. A middle ear effusion is present.     Nose: Mucosal edema, congestion and rhinorrhea present. No nasal deformity, septal deviation or laceration.     Right Turbinates: Enlarged and swollen. Not pale.     Left Turbinates: Enlarged and swollen. Not pale.     Right Sinus: Maxillary sinus tenderness and frontal sinus tenderness present.     Left Sinus: Maxillary sinus tenderness and frontal sinus tenderness present.     Mouth/Throat:     Lips: Pink. No lesions.     Mouth: Mucous membranes are not pale, dry and not cyanotic. No lacerations, oral lesions or angioedema.     Dentition: Normal dentition. Does not have dentures. No dental caries or dental abscesses.     Tongue: No lesions.     Pharynx: Uvula midline. Pharyngeal swelling and posterior oropharyngeal erythema present. No oropharyngeal exudate or uvula swelling.     Tonsils: No tonsillar exudate or tonsillar abscesses. Swelling: 0 on the right. 0 on the left.  Comments: Cobblestoning posterior pharynx; bilateral TMs air fluid level clear; bilateral allergic shiners; nasal turbinates edema erythema clear discharge nares Eyes:     General: Lids are normal. Allergic shiner present. No visual field deficit or scleral icterus.       Right eye: No foreign body, discharge or hordeolum.        Left eye: No foreign body, discharge or hordeolum.     Extraocular Movements: Extraocular movements intact.     Right eye: Normal extraocular motion and no nystagmus.     Left  eye: Normal extraocular motion and no nystagmus.     Conjunctiva/sclera: Conjunctivae normal.     Right eye: Right conjunctiva is not injected. No chemosis, exudate or hemorrhage.    Left eye: Left conjunctiva is not injected. No chemosis, exudate or hemorrhage.    Pupils: Pupils are equal, round, and reactive to light. Pupils are equal.     Right eye: Pupil is round and reactive.     Left eye: Pupil is round and reactive.  Neck:     Musculoskeletal: Normal range of motion and neck supple. Normal range of motion. No edema, erythema, neck rigidity, spinous process tenderness or muscular tenderness.     Thyroid: No thyroid mass or thyromegaly.     Trachea: Trachea and phonation normal. No tracheal tenderness or tracheal deviation.  Cardiovascular:     Rate and Rhythm: Normal rate and regular rhythm.     Chest Wall: PMI is not displaced.     Pulses: Normal pulses.          Radial pulses are 2+ on the right side and 2+ on the left side.     Heart sounds: Normal heart sounds, S1 normal and S2 normal. No murmur. No friction rub. No gallop.   Pulmonary:     Effort: Pulmonary effort is normal. No accessory muscle usage or respiratory distress.     Breath sounds: Decreased air movement present. No stridor or transmitted upper airway sounds. Examination of the right-lower field reveals decreased breath sounds. Examination of the left-lower field reveals decreased breath sounds. Decreased breath sounds present. No wheezing, rhonchi or rales.     Comments: Decreased BS bases bilaterally; intermittent nonproductive cough in exam room; spoke full sentences without difficulty Chest:     Chest wall: No tenderness.  Abdominal:     General: Abdomen is flat. Bowel sounds are decreased. There is no distension.     Palpations: Abdomen is soft. There is no shifting dullness, fluid wave, hepatomegaly, splenomegaly, mass or pulsatile mass.     Tenderness: There is no abdominal tenderness. There is no right CVA  tenderness, left CVA tenderness, guarding or rebound. Negative signs include Murphy's sign.     Hernia: No hernia is present. There is no hernia in the umbilical area or ventral area.  Musculoskeletal: Normal range of motion.        General: No tenderness.     Right shoulder: Normal.     Left shoulder: Normal.     Right elbow: Normal.    Left elbow: Normal.     Right hip: Normal.     Left hip: Normal.     Right knee: Normal.     Left knee: Normal.     Cervical back: Normal.     Thoracic back: Normal.     Lumbar back: Normal.     Right hand: Normal.     Left hand: Normal.  Lymphadenopathy:     Head:  Right side of head: No submental, submandibular, tonsillar, preauricular, posterior auricular or occipital adenopathy.     Left side of head: No submental, submandibular, tonsillar, preauricular, posterior auricular or occipital adenopathy.     Cervical: No cervical adenopathy.     Right cervical: No superficial, deep or posterior cervical adenopathy.    Left cervical: No superficial, deep or posterior cervical adenopathy.  Skin:    General: Skin is warm and dry.     Capillary Refill: Capillary refill takes less than 2 seconds.     Coloration: Skin is not ashen, cyanotic, jaundiced, mottled, pale or sallow.     Findings: No abrasion, bruising, burn, ecchymosis, erythema, laceration, lesion, petechiae or rash.     Nails: There is no clubbing.   Neurological:     General: No focal deficit present.     Mental Status: She is alert and oriented to person, place, and time. Mental status is at baseline. She is not disoriented.     GCS: GCS eye subscore is 4. GCS verbal subscore is 5. GCS motor subscore is 6.     Cranial Nerves: Cranial nerves are intact. No cranial nerve deficit, dysarthria or facial asymmetry.     Sensory: Sensation is intact. No sensory deficit.     Motor: Motor function is intact. No weakness, tremor, atrophy, abnormal muscle tone or seizure activity.      Coordination: Coordination is intact. Coordination normal.     Gait: Gait is intact. Gait normal.     Comments: Gait sure and steady hallway; in/out of chair without difficulty  Psychiatric:        Attention and Perception: Attention and perception normal.        Mood and Affect: Mood and affect normal.        Speech: Speech normal.        Behavior: Behavior normal. Behavior is cooperative.        Thought Content: Thought content normal.        Cognition and Memory: Cognition and memory normal.        Judgment: Judgment normal.           Assessment & Plan:  A-viral gastroenteritis and acute rhinosinusitis/bronchitis  P- I have recommended clear fluids and bland diet.  Avoid dairy/spicy, fried and large portions of meat while having nausea.  If vomiting hold po intake x 1 hour.  Then sips clear fluids like broths, ginger ale, power ade, gatorade, pedialyte may advance to soft/bland if no vomiting x 24 hours and appetite returned otherwise hydration main focus.  zofran 4mg  po BID prn n/v #6 RF0 paper rx given to patient she did not want electronic Rx stated she is not planning to fill unless her condition worsens greatly.   Return to the clinic if symptoms persist or worsen; I have alerted the patient to call if high fever, dehydration, marked weakness, fainting, increased abdominal pain, blood in stool or vomit (red or black). May take immodium prn 2mg   Exitcare handout on viral gastroenteritis .  Discussed with patient mildly dehydrated today.  Coffee/Tea are diuretic recommended gatorade/nondairy popsicles/ginger ale/broths first line if possible or alternating low sugar gatorade G2/powerade/water.   Patient verbalized agreement and understanding of treatment plan and had no further questions at this time.   Restart low dose prednisone taper for bronchitis/sinusitis.  Prednisone 10mg  t3po x2days; t2po x 2days and t1 po x2 days taper with breakfast #21 RF0 dispensed from [facility] to patient.  Continue plan of care as  discussed office visit 09/10/2018.  Discussed cold weather front pushing through area with thunderstorms today not helping with sinus pain/pressure again, stay inside out of wind.  Consider mask in her dusty work area.  Use her nasal saline at work when congested.  Continue breo, albuterol prn Rest, hydrate I agree with going home to rest today versus staying at work while sick.  Patient to talk with her supervisor.  Give restarting prednisone a couple days but if worsening sinus pain/pressure and discharge cloudy tan/rose colored start azithromycin 250mg  sig t2 day 1 then take 1 tab days 2-5 #6 RF0 previously dispensed from [facility] at home today.  Discussed caution with immodium and azithromycin combined use can cause QT prolongation/arrhthmias/palpitations per [facility].  If these occur stop immodium use and notify me  Discussed with patient azithromycin has a long half life 96 hours. Continue flonase 1 spray each nostril BID, saline 2 sprays each nostril q2h wa prn congestion (increase her frequency use while awake if sinus pain/pressure continuing).  May continue phenylephrine 5-10mg  po q6h prn rhinitis OTC given 8 UD from clinic stock.  Hydrate hydrate hydrate with noncaffeinated beverages  Continue singulair 10mg  po qhs and xyzal 5mg  po qam.  Denied personal or family history of ENT cancer.  Shower BID especially prior to bed. No evidence of systemic bacterial infection, non toxic and well hydrated.  I do not see where any further testing or imaging is necessary at this time.  May use OTC cough medicine e.g. robitussin/delsyn or nyquil per manufacturer's instructions.  Honey with lemon.  Soup broths salty not creamy.   I will suggest supportive care, rest, good hygiene and encourage the patient to take adequate fluids.  The patient is to return to clinic or EMERGENCY ROOM if symptoms worsen or change significantly.  Exitcare handout on laryngitis, sinusitis and sinus rinse.  Patient  verbalized agreement and understanding of treatment plan and had no further questions at this time.   P2:  Hand washing and cover cough  Continue breo--she is on highest recommended dose.  Had seen her allergist recently-he held her allergy shot last week and she is not going tomorrow due to acute illness.  Prednisone taper 30/20/10 x 2 days each dispensed from [facility] to patient #21 RF0 to restart.  Ensure hydrating with water avoiding dehydration. Cough lozenges po q2h prn cough  Discussed possible side effects increased/decreased appetite, difficulty sleeping, increased blood sugar, increased blood pressure and heart rate.  Albuterol MDI 50mcg 1-2 puffs po q4-6h prn protracted cough/wheeze at home possible side effect increased heart rate. Bronchitis simple, community acquired, may have started as viral (probably respiratory syncytial, parainfluenza, influenza, or adenovirus), but now evidence of acute purulent bronchitis with resultant bronchial edema and mucus formation.  Viruses are the most common cause of bronchial inflammation in otherwise healthy adults with acute bronchitis.  The appearance of sputum is not predictive of whether a bacterial infection is present.  Purulent sputum is most often caused by viral infections.  There are a small portion of those caused by non-viral agents being Mycoplama pneumonia.  Microscopic examination or C&S of sputum in the healthy adult with acute bronchitis is generally not helpful (usually negative or normal respiratory flora) other considerations being cough from upper respiratory tract infections, sinusitis or allergic syndromes (mild asthma or viral pneumonia).  Differential Diagnoses:  reactive airway disease (asthma, allergic aspergillosis (eosinophilia), chronic bronchitis, respiratory infection (sinusitis, common cold, pneumonia), congestive heart failure, reflux esophagitis, bronchogenic tumor, aspiration syndromes and/or exposure  to pulmonary irritants/smoke.   I will order Without high fever, severe dyspnea, lack of physical findings or other risk factors, I will hold on a chest radiograph and CBC at this time.  I discussed that approximately 50% of patients with acute bronchitis have a cough that lasts up to three weeks, and 25% for over a month.  Tylenol 500mg  one to two tablets every four to six hours as needed for fever or myalgias.  No aspirin.   ER if hemopthysis, SOB, worst chest pain of life overnight.  Otherwise notify PCM/go to UC re-evaluation if worsening despite plan of care tomorrow.   Patient instructed to follow up in one week or sooner if symptoms worsen/do not resolve.  Patient verbalized agreement and understanding of treatment plan and had no further questions at this time.  P2:  hand washing and cover cough  Patient may use normal saline nasal spray 2 sprays each nostril q2h wa as needed. flonase 81mcg 1 spray each nostril BID.  Patient denied personal or family history of ENT cancer.  OTC antihistamine of choice xyzal 5mg  po daily and singulair 10mg  po qhs. Avoid triggers if possible.  Shower prior to bedtime if exposed to triggers.  If allergic dust/dust mites recommend mattress/pillow covers/encasements; washing linens, vacuuming, sweeping, dusting weekly.  Call or return to clinic as needed if these symptoms worsen or fail to improve as anticipated.   Patient verbalized understanding of instructions, agreed with plan of care and had no further questions at this time.  P2:  Avoidance and hand washing.

## 2018-09-27 ENCOUNTER — Other Ambulatory Visit: Payer: Self-pay | Admitting: Family Medicine

## 2018-09-27 NOTE — Telephone Encounter (Signed)
SNR desvenlafaxine & venlafaxine failed Requested Prescriptions  Refused Prescriptions Disp Refills  . venlafaxine XR (EFFEXOR-XR) 150 MG 24 hr capsule [Pharmacy Med Name: VENLAFAXINE ER 150MG  CAPSULES] 90 capsule 0    Sig: TAKE 1 CAPSULE BY MOUTH DAILY WITH BREAKFAST     Psychiatry: Antidepressants - SNRI - desvenlafaxine & venlafaxine Failed - 09/27/2018  5:33 AM      Failed - LDL in normal range and within 360 days    LDL Calculated  Date Value Ref Range Status  11/21/2016 91 0 - 99 mg/dL Final         Failed - Total Cholesterol in normal range and within 360 days    Cholesterol, Total  Date Value Ref Range Status  11/21/2016 186 100 - 199 mg/dL Final  03/20/2012 173  Final         Failed - Triglycerides in normal range and within 360 days    Triglycerides  Date Value Ref Range Status  11/21/2016 123 0 - 149 mg/dL Final  03/20/2012 140  Final         Failed - Last BP in normal range    BP Readings from Last 1 Encounters:  09/19/18 (!) 164/94         Passed - Completed PHQ-2 or PHQ-9 in the last 360 days.      Passed - Valid encounter within last 6 months    Recent Outpatient Visits          3 months ago Acute URI   [facility], [place], [place]   5 months ago Right hip pain   [facility] at [facility], Lilia Argue, MD   8 months ago Moderate persistent asthma with acute exacerbation   [facility], Laurey Arrow, MD   8 months ago Chest tightness   [facility] [place], PA-C   9 months ago Acute recurrent ethmoidal sinusitis   [facility] at [facility], Laurey Arrow, [place]

## 2018-10-09 ENCOUNTER — Other Ambulatory Visit: Payer: Self-pay | Admitting: Family Medicine

## 2018-10-15 ENCOUNTER — Telehealth: Payer: Self-pay | Admitting: *Deleted

## 2018-10-15 ENCOUNTER — Other Ambulatory Visit: Payer: Self-pay | Admitting: Family Medicine

## 2018-10-15 MED ORDER — VENLAFAXINE HCL ER 150 MG PO CP24: ORAL_CAPSULE | ORAL | 0 refills | Status: DC

## 2018-10-15 NOTE — Telephone Encounter (Signed)
Please advise 

## 2018-10-15 NOTE — Telephone Encounter (Signed)
Pt calling clinic reporting her primary provider is no longer at facility and she has been trying to get a med refill on her Venlafaxine XR for a couple of weeks and has had no response. Per chart review, this is correct and it appears that an updated lipid panel is needed per 09/27/18 notes.   After discussion with NP Betancourt, 30 day supply with no refills placed to preferred pharmacy. Advised pt an appt was made for 10/18/18 for fasting labs in clinic which can then be reviewed by available provider at [facility] and pt can do a virtual visit for additional refills or other needs.

## 2018-10-15 NOTE — Telephone Encounter (Signed)
Noted bridge refill as patient has run out and [facility] office has not responded x2 weeks.  Patient will follow up with [facility] office for routine fill.  Increased stress due to covid 19 pandemic.  Denied HI/SI.  Lab draw lipids will be scheduled with RN Hildred Alamin as soon as possible.  CMP stable/normal.

## 2018-10-18 ENCOUNTER — Other Ambulatory Visit: Payer: Self-pay

## 2018-10-18 ENCOUNTER — Ambulatory Visit: Payer: Self-pay | Admitting: *Deleted

## 2018-10-18 VITALS — BP 148/95 | HR 78

## 2018-10-18 DIAGNOSIS — Z0000 Encounter for general adult medical examination without abnormal findings: Secondary | ICD-10-CM

## 2018-10-18 NOTE — Progress Notes (Signed)
Annual labs completed as pt's pcp left practice and she does not know where provider transferred to and does not want to stay at the current practice due to their handling of the provider leaving.

## 2018-10-22 ENCOUNTER — Ambulatory Visit: Payer: Self-pay | Admitting: *Deleted

## 2018-10-22 ENCOUNTER — Other Ambulatory Visit: Payer: Self-pay

## 2018-10-22 DIAGNOSIS — Z0000 Encounter for general adult medical examination without abnormal findings: Secondary | ICD-10-CM

## 2018-10-22 NOTE — Addendum Note (Signed)
Addended by: Beckie Busing on: 10/22/2018 09:18 AM   Modules accepted: Orders

## 2018-10-22 NOTE — Progress Notes (Signed)
Lab sample not picked up Friday 4/10. Labs redrawn today.

## 2018-10-23 LAB — CMP12+LP+TP+TSH+6AC+CBC/D/PLT
ALT: 56 IU/L — ABNORMAL HIGH (ref 0–32)
AST: 41 IU/L — ABNORMAL HIGH (ref 0–40)
Albumin/Globulin Ratio: 2.2 (ref 1.2–2.2)
Albumin: 4.8 g/dL (ref 3.8–4.9)
Alkaline Phosphatase: 73 IU/L (ref 39–117)
BUN/Creatinine Ratio: 15 (ref 9–23)
BUN: 10 mg/dL (ref 6–24)
Basophils Absolute: 0.1 10*3/uL (ref 0.0–0.2)
Basos: 1 %
Bilirubin Total: 0.3 mg/dL (ref 0.0–1.2)
Calcium: 9.6 mg/dL (ref 8.7–10.2)
Chloride: 100 mmol/L (ref 96–106)
Chol/HDL Ratio: 4.1 ratio (ref 0.0–4.4)
Cholesterol, Total: 243 mg/dL — ABNORMAL HIGH (ref 100–199)
Creatinine, Ser: 0.65 mg/dL (ref 0.57–1.00)
EOS (ABSOLUTE): 0.1 10*3/uL (ref 0.0–0.4)
Eos: 2 %
Estimated CHD Risk: 0.9 times avg. (ref 0.0–1.0)
Free Thyroxine Index: 1.6 (ref 1.2–4.9)
GFR calc Af Amer: 116 mL/min/{1.73_m2} (ref >59)
GFR calc non Af Amer: 101 mL/min/{1.73_m2} (ref >59)
GGT: 21 IU/L (ref 0–60)
Globulin, Total: 2.2 g/dL (ref 1.5–4.5)
Glucose: 127 mg/dL — ABNORMAL HIGH (ref 65–99)
HDL: 59 mg/dL (ref >39)
Hematocrit: 39.8 % (ref 34.0–46.6)
Hemoglobin: 13.9 g/dL (ref 11.1–15.9)
Immature Grans (Abs): 0 10*3/uL (ref 0.0–0.1)
Immature Granulocytes: 1 %
Iron: 70 ug/dL (ref 27–159)
LDH: 223 IU/L (ref 119–226)
LDL Calculated: 139 mg/dL — ABNORMAL HIGH (ref 0–99)
Lymphocytes Absolute: 1.6 10*3/uL (ref 0.7–3.1)
Lymphs: 24 %
MCH: 30.5 pg (ref 26.6–33.0)
MCHC: 34.9 g/dL (ref 31.5–35.7)
MCV: 87 fL (ref 79–97)
Monocytes Absolute: 0.6 10*3/uL (ref 0.1–0.9)
Monocytes: 9 %
Neutrophils Absolute: 4.2 10*3/uL (ref 1.4–7.0)
Neutrophils: 63 %
Phosphorus: 4.1 mg/dL (ref 3.0–4.3)
Platelets: 266 10*3/uL (ref 150–450)
Potassium: 4.1 mmol/L (ref 3.5–5.2)
RBC: 4.56 x10E6/uL (ref 3.77–5.28)
RDW: 13.7 % (ref 11.7–15.4)
Sodium: 138 mmol/L (ref 134–144)
T3 Uptake Ratio: 21 % — ABNORMAL LOW (ref 24–39)
T4, Total: 7.5 ug/dL (ref 4.5–12.0)
TSH: 0.613 u[IU]/mL (ref 0.450–4.500)
Total Protein: 7 g/dL (ref 6.0–8.5)
Triglycerides: 223 mg/dL — ABNORMAL HIGH (ref 0–149)
Uric Acid: 4.7 mg/dL (ref 2.5–7.1)
VLDL Cholesterol Cal: 45 mg/dL — ABNORMAL HIGH (ref 5–40)
WBC: 6.6 10*3/uL (ref 3.4–10.8)

## 2018-10-23 LAB — HGB A1C W/O EAG: Hgb A1c MFr Bld: 7.2 % — ABNORMAL HIGH (ref 4.8–5.6)

## 2018-10-24 NOTE — Progress Notes (Signed)
noted 

## 2018-11-04 ENCOUNTER — Ambulatory Visit: Payer: PRIVATE HEALTH INSURANCE | Admitting: Family Medicine

## 2018-11-09 ENCOUNTER — Other Ambulatory Visit: Payer: Self-pay | Admitting: Registered Nurse

## 2018-11-11 NOTE — Telephone Encounter (Signed)
Noted 30 day bridge refill approved for patient

## 2018-11-11 NOTE — Telephone Encounter (Signed)
Pt reports she called her former pcp office multiple times last week to request forwarding information on where her pcp had moved to as they reported they would not release the info until after 4/22. She sts all the phone calls resulted in extended hold times that she could not complete. She plans to try to call again today after she is off work. Requests another one month supply as she continues trying to establish with a new pcp.

## 2018-11-12 ENCOUNTER — Ambulatory Visit: Payer: Self-pay | Admitting: Registered Nurse

## 2018-11-12 ENCOUNTER — Encounter: Payer: Self-pay | Admitting: Registered Nurse

## 2018-11-12 ENCOUNTER — Other Ambulatory Visit: Payer: Self-pay

## 2018-11-12 VITALS — BP 152/93 | HR 100 | Temp 98.9°F

## 2018-11-12 DIAGNOSIS — J208 Acute bronchitis due to other specified organisms: Secondary | ICD-10-CM

## 2018-11-12 DIAGNOSIS — J0111 Acute recurrent frontal sinusitis: Secondary | ICD-10-CM

## 2018-11-12 DIAGNOSIS — J301 Allergic rhinitis due to pollen: Secondary | ICD-10-CM

## 2018-11-12 MED ORDER — AZITHROMYCIN 250 MG PO TABS: ORAL_TABLET | ORAL | 0 refills | Status: DC

## 2018-11-12 MED ORDER — SALINE SPRAY 0.65 % NA SOLN: 2.0000 | NASAL | 0 refills | Status: DC

## 2018-11-12 MED ORDER — PREDNISONE 10 MG PO TABS: ORAL_TABLET | ORAL | 0 refills | Status: AC

## 2018-11-12 NOTE — Patient Instructions (Signed)
Allergic Rhinitis, Adult Allergic rhinitis is an allergic reaction that affects the mucous membrane inside the nose. It causes sneezing, a runny or stuffy nose, and the feeling of mucus going down the back of the throat (postnasal drip). Allergic rhinitis can be mild to severe. There are two types of allergic rhinitis:  Seasonal. This type is also called hay fever. It happens only during certain seasons.  Perennial. This type can happen at any time of the year. What are the causes? This condition happens when the body's defense system (immune system) responds to certain harmless substances called allergens as though they were germs.  Seasonal allergic rhinitis is triggered by pollen, which can come from grasses, trees, and weeds. Perennial allergic rhinitis may be caused by:  House dust mites.  Pet dander.  Mold spores. What are the signs or symptoms? Symptoms of this condition include:  Sneezing.  Runny or stuffy nose (nasal congestion).  Postnasal drip.  Itchy nose.  Tearing of the eyes.  Trouble sleeping.  Daytime sleepiness. How is this diagnosed? This condition may be diagnosed based on:  Your medical history.  A physical exam.  Tests to check for related conditions, such as: ? Asthma. ? Pink eye. ? Ear infection. ? Upper respiratory infection.  Tests to find out which allergens trigger your symptoms. These may include skin or blood tests. How is this treated? There is no cure for this condition, but treatment can help control symptoms. Treatment may include:  Taking medicines that block allergy symptoms, such as antihistamines. Medicine may be given as a shot, nasal spray, or pill.  Avoiding the allergen.  Desensitization. This treatment involves getting ongoing shots until your body becomes less sensitive to the allergen. This treatment may be done if other treatments do not help.  If taking medicine and avoiding the allergen does not work, new, stronger  medicines may be prescribed. Follow these instructions at home:  Find out what you are allergic to. Common allergens include smoke, dust, and pollen.  Avoid the things you are allergic to. These are some things you can do to help avoid allergens: ? Replace carpet with wood, tile, or vinyl flooring. Carpet can trap dander and dust. ? Do not smoke. Do not allow smoking in your home. ? Change your heating and air conditioning filter at least once a month. ? During allergy season:  Keep windows closed as much as possible.  Plan outdoor activities when pollen counts are lowest. This is usually during the evening hours.  When coming indoors, change clothing and shower before sitting on furniture or bedding.  Take over-the-counter and prescription medicines only as told by your health care provider.  Keep all follow-up visits as told by your health care provider. This is important. Contact a health care provider if:  You have a fever.  You develop a persistent cough.  You make whistling sounds when you breathe (you wheeze).  Your symptoms interfere with your normal daily activities. Get help right away if:  You have shortness of breath. Summary  This condition can be managed by taking medicines as directed and avoiding allergens.  Contact your health care provider if you develop a persistent cough or fever.  During allergy season, keep windows closed as much as possible. This information is not intended to replace advice given to you by your health care provider. Make sure you discuss any questions you have with your health care provider. Document Released: 03/21/2001 Document Revised: 08/03/2016 Document Reviewed: 08/03/2016 Elsevier Interactive  Patient Education  2019 [employer]. How to Use a Metered Dose Inhaler A metered dose inhaler is a handheld device for taking medicine that must be breathed into the lungs (inhaled). The device can be used to deliver a variety of  inhaled medicines, including:  Quick relief or rescue medicines, such as bronchodilators.  Controller medicines, such as corticosteroids. The medicine is delivered by pushing down on a metal canister to release a preset amount of spray and medicine. Each device contains the amount of medicine that is needed for a preset number of uses (inhalations). Your health care provider may recommend that you use a spacer with your inhaler to help you take the medicine more effectively. A spacer is a plastic tube with a mouthpiece on one end and an opening that connects to the inhaler on the other end. A spacer holds the medicine in a tube for a short time, which allows you to inhale more medicine. What are the risks? If you do not use your inhaler correctly, medicine might not reach your lungs to help you breathe. Inhaler medicine can cause side effects, such as:  Mouth or throat infection.  Cough.  Hoarseness.  Headache.  Nausea and vomiting.  Lung infection (pneumonia) in people who have a lung condition called COPD. How to use a metered dose inhaler without a spacer  1. Remove the cap from the inhaler. 2. If you are using the inhaler for the first time, shake it for 5 seconds, turn it away from your face, then release 4 puffs into the air. This is called priming. 3. Shake the inhaler for 5 seconds. 4. Position the inhaler so the top of the canister faces up. 5. Put your index finger on the top of the medicine canister. Support the bottom of the inhaler with your thumb. 6. Breathe out normally and as completely as possible, away from the inhaler. 7. Either place the inhaler between your teeth and close your lips tightly around the mouthpiece, or hold the inhaler 1-2 inches (2.5-5 cm) away from your open mouth. Keep your tongue down out of the way. If you are unsure which technique to use, ask your health care provider. 8. Press the canister down with your index finger to release the medicine,  then inhale deeply and slowly through your mouth (not your nose) until your lungs are completely filled. Inhaling should take 4-6 seconds. 9. Hold the medicine in your lungs for 5-10 seconds (10 seconds is best). This helps the medicine get into the small airways of your lungs. 10. With your lips in a tight circle (pursed), breathe out slowly. 11. Repeat steps 3-10 until you have taken the number of puffs that your health care provider directed. Wait about 1 minute between puffs or as directed. 12. Put the cap on the inhaler. 13. If you are using a steroid inhaler, rinse your mouth with water, gargle, and spit out the water. Do not swallow the water. How to use a metered dose inhaler with a spacer  1. Remove the cap from the inhaler. 2. If you are using the inhaler for the first time, shake it for 5 seconds, turn it away from your face, then release 4 puffs into the air. This is called priming. 3. Shake the inhaler for 5 seconds. 4. Place the open end of the spacer onto the inhaler mouthpiece. 5. Position the inhaler so the top of the canister faces up and the spacer mouthpiece faces you. 6. Put your index finger  on the top of the medicine canister. Support the bottom of the inhaler and the spacer with your thumb. 7. Breathe out normally and as completely as possible, away from the spacer. 8. Place the spacer between your teeth and close your lips tightly around it. Keep your tongue down out of the way. 9. Press the canister down with your index finger to release the medicine, then inhale deeply and slowly through your mouth (not your nose) until your lungs are completely filled. Inhaling should take 4-6 seconds. 10. Hold the medicine in your lungs for 5-10 seconds (10 seconds is best). This helps the medicine get into the small airways of your lungs. 11. With your lips in a tight circle (pursed), breathe out slowly. 12. Repeat steps 3-11 until you have taken the number of puffs that your health  care provider directed. Wait about 1 minute between puffs or as directed. 13. Remove the spacer from the inhaler and put the cap on the inhaler. 14. If you are using a steroid inhaler, rinse your mouth with water, gargle, and spit out the water. Do not swallow the water. Follow these instructions at home:  Take your inhaled medicine only as told by your health care provider. Do not use the inhaler more than directed by your health care provider.  Keep all follow-up visits as told by your health care provider. This is important.  If your inhaler has a counter, you can check it to determine how full your inhaler is. If your inhaler does not have a counter, ask your health care provider when you will need to refill your inhaler and write the refill date on a calendar or on your inhaler canister. Note that you cannot know when an inhaler is empty by shaking it.  Follow directions on the package insert for care and cleaning of your inhaler and spacer. Contact a health care provider if:  Symptoms are only partially relieved with your inhaler.  You are having trouble using your inhaler.  You have an increase in phlegm.  You have headaches. Get help right away if:  You feel little or no relief after using your inhaler.  You have dizziness.  You have a fast heart rate.  You have chills or a fever.  You have night sweats.  There is blood in your phlegm. Summary  A metered dose inhaler is a handheld device for taking medicine that must be breathed into the lungs (inhaled).  The medicine is delivered by pushing down on a metal canister to release a preset amount of spray and medicine.  Each device contains the amount of medicine that is needed for a preset number of uses (inhalations). This information is not intended to replace advice given to you by your health care provider. Make sure you discuss any questions you have with your health care provider. Document Released: 06/26/2005  Document Revised: 01/15/2017 Document Reviewed: 05/16/2016 Elsevier Interactive Patient Education  2019 [employer] Acute Bronchitis, Adult  Acute bronchitis is sudden (acute) swelling of the air tubes (bronchi) in the lungs. Acute bronchitis causes these tubes to fill with mucus, which can make it hard to breathe. It can also cause coughing or wheezing. In adults, acute bronchitis usually goes away within 2 weeks. A cough caused by bronchitis may last up to 3 weeks. Smoking, allergies, and asthma can make the condition worse. Repeated episodes of bronchitis may cause further lung problems, such as chronic obstructive pulmonary disease (COPD). What are the causes? This condition  can be caused by germs and by substances that irritate the lungs, including:  Cold and flu viruses. This condition is most often caused by the same virus that causes a cold.  Bacteria.  Exposure to tobacco smoke, dust, fumes, and air pollution. What increases the risk? This condition is more likely to develop in people who:  Have close contact with someone with acute bronchitis.  Are exposed to lung irritants, such as tobacco smoke, dust, fumes, and vapors.  Have a weak immune system.  Have a respiratory condition such as asthma. What are the signs or symptoms? Symptoms of this condition include:  A cough.  Coughing up clear, yellow, or green mucus.  Wheezing.  Chest congestion.  Shortness of breath.  A fever.  Body aches.  Chills.  A sore throat. How is this diagnosed? This condition is usually diagnosed with a physical exam. During the exam, your health care provider may order tests, such as chest X-rays, to rule out other conditions. He or she may also:  Test a sample of your mucus for bacterial infection.  Check the level of oxygen in your blood. This is done to check for pneumonia.  Do a chest X-ray or lung function testing to rule out pneumonia and other conditions.  Perform blood  tests. Your health care provider will also ask about your symptoms and medical history. How is this treated? Most cases of acute bronchitis clear up over time without treatment. Your health care provider may recommend:  Drinking more fluids. Drinking more makes your mucus thinner, which may make it easier to breathe.  Taking a medicine for a fever or cough.  Taking an antibiotic medicine.  Using an inhaler to help improve shortness of breath and to control a cough.  Using a cool mist vaporizer or humidifier to make it easier to breathe. Follow these instructions at home: Medicines  Take over-the-counter and prescription medicines only as told by your health care provider.  If you were prescribed an antibiotic, take it as told by your health care provider. Do not stop taking the antibiotic even if you start to feel better. General instructions   Get plenty of rest.  Drink enough fluids to keep your urine pale yellow.  Avoid smoking and secondhand smoke. Exposure to cigarette smoke or irritating chemicals will make bronchitis worse. If you smoke and you need help quitting, ask your health care provider. Quitting smoking will help your lungs heal faster.  Use an inhaler, cool mist vaporizer, or humidifier as told by your health care provider.  Keep all follow-up visits as told by your health care provider. This is important. How is this prevented? To lower your risk of getting this condition again:  Wash your hands often with soap and water. If soap and water are not available, use hand sanitizer.  Avoid contact with people who have cold symptoms.  Try not to touch your hands to your mouth, nose, or eyes.  Make sure to get the flu shot every year. Contact a health care provider if:  Your symptoms do not improve in 2 weeks of treatment. Get help right away if:  You cough up blood.  You have chest pain.  You have severe shortness of breath.  You become  dehydrated.  You faint or keep feeling like you are going to faint.  You keep vomiting.  You have a severe headache.  Your fever or chills gets worse. This information is not intended to replace advice given to you  by your health care provider. Make sure you discuss any questions you have with your health care provider. Document Released: 08/03/2004 Document Revised: 02/07/2017 Document Reviewed: 12/15/2015 Elsevier Interactive Patient Education  2019 [employer] Sinusitis, Adult Sinusitis is inflammation of your sinuses. Sinuses are hollow spaces in the bones around your face. Your sinuses are located:  Around your eyes.  In the middle of your forehead.  Behind your nose.  In your cheekbones. Mucus normally drains out of your sinuses. When your nasal tissues become inflamed or swollen, mucus can become trapped or blocked. This allows bacteria, viruses, and fungi to grow, which leads to infection. Most infections of the sinuses are caused by a virus. Sinusitis can develop quickly. It can last for up to 4 weeks (acute) or for more than 12 weeks (chronic). Sinusitis often develops after a cold. What are the causes? This condition is caused by anything that creates swelling in the sinuses or stops mucus from draining. This includes:  Allergies.  Asthma.  Infection from bacteria or viruses.  Deformities or blockages in your nose or sinuses.  Abnormal growths in the nose (nasal polyps).  Pollutants, such as chemicals or irritants in the air.  Infection from fungi (rare). What increases the risk? You are more likely to develop this condition if you:  Have a weak body defense system (immune system).  Do a lot of swimming or diving.  Overuse nasal sprays.  Smoke. What are the signs or symptoms? The main symptoms of this condition are pain and a feeling of pressure around the affected sinuses. Other symptoms include:  Stuffy nose or congestion.  Thick drainage from  your nose.  Swelling and warmth over the affected sinuses.  Headache.  Upper toothache.  A cough that may get worse at night.  Extra mucus that collects in the throat or the back of the nose (postnasal drip).  Decreased sense of smell and taste.  Fatigue.  A fever.  Sore throat.  Bad breath. How is this diagnosed? This condition is diagnosed based on:  Your symptoms.  Your medical history.  A physical exam.  Tests to find out if your condition is acute or chronic. This may include: ? Checking your nose for nasal polyps. ? Viewing your sinuses using a device that has a light (endoscope). ? Testing for allergies or bacteria. ? Imaging tests, such as an MRI or CT scan. In rare cases, a bone biopsy may be done to rule out more serious types of fungal sinus disease. How is this treated? Treatment for sinusitis depends on the cause and whether your condition is chronic or acute.  If caused by a virus, your symptoms should go away on their own within 10 days. You may be given medicines to relieve symptoms. They include: ? Medicines that shrink swollen nasal passages (topical intranasal decongestants). ? Medicines that treat allergies (antihistamines). ? A spray that eases inflammation of the nostrils (topical intranasal corticosteroids). ? Rinses that help get rid of thick mucus in your nose (nasal saline washes).  If caused by bacteria, your health care provider may recommend waiting to see if your symptoms improve. Most bacterial infections will get better without antibiotic medicine. You may be given antibiotics if you have: ? A severe infection. ? A weak immune system.  If caused by narrow nasal passages or nasal polyps, you may need to have surgery. Follow these instructions at home: Medicines  Take, use, or apply over-the-counter and prescription medicines only as told by your  health care provider. These may include nasal sprays.  If you were prescribed an  antibiotic medicine, take it as told by your health care provider. Do not stop taking the antibiotic even if you start to feel better. Hydrate and humidify   Drink enough fluid to keep your urine pale yellow. Staying hydrated will help to thin your mucus.  Use a cool mist humidifier to keep the humidity level in your home above 50%.  Inhale steam for 10-15 minutes, 3-4 times a day, or as told by your health care provider. You can do this in the bathroom while a hot shower is running.  Limit your exposure to cool or dry air. Rest  Rest as much as possible.  Sleep with your head raised (elevated).  Make sure you get enough sleep each night. General instructions   Apply a warm, moist washcloth to your face 3-4 times a day or as told by your health care provider. This will help with discomfort.  Wash your hands often with soap and water to reduce your exposure to germs. If soap and water are not available, use hand sanitizer.  Do not smoke. Avoid being around people who are smoking (secondhand smoke).  Keep all follow-up visits as told by your health care provider. This is important. Contact a health care provider if:  You have a fever.  Your symptoms get worse.  Your symptoms do not improve within 10 days. Get help right away if:  You have a severe headache.  You have persistent vomiting.  You have severe pain or swelling around your face or eyes.  You have vision problems.  You develop confusion.  Your neck is stiff.  You have trouble breathing. Summary  Sinusitis is soreness and inflammation of your sinuses. Sinuses are hollow spaces in the bones around your face.  This condition is caused by nasal tissues that become inflamed or swollen. The swelling traps or blocks the flow of mucus. This allows bacteria, viruses, and fungi to grow, which leads to infection.  If you were prescribed an antibiotic medicine, take it as told by your health care provider. Do not  stop taking the antibiotic even if you start to feel better.  Keep all follow-up visits as told by your health care provider. This is important. This information is not intended to replace advice given to you by your health care provider. Make sure you discuss any questions you have with your health care provider. Document Released: 06/26/2005 Document Revised: 11/26/2017 Document Reviewed: 11/26/2017 Elsevier Interactive Patient Education  2019 [employer]. How to Perform a Sinus Rinse A sinus rinse is a home treatment that is used to rinse your sinuses with a sterile mixture of salt and water (saline solution). Sinuses are air-filled spaces in your skull behind the bones of your face and forehead that open into your nasal cavity. A sinus rinse can help to clear mucus, dirt, dust, or pollen from your nasal cavity. You may do a sinus rinse when you have a cold, a virus, nasal allergy symptoms, a sinus infection, or stuffiness in your nose or sinuses. Talk with your health care provider about whether a sinus rinse might help you. What are the risks? A sinus rinse is generally safe and effective. However, there are a few risks, which include:  A burning sensation in your sinuses. This may happen if you do not make the saline solution as directed. Be sure to follow all directions when making the saline solution.  Nasal  irritation.  Infection from contaminated water. This is rare, but possible. Do not do a sinus rinse if you have had ear or nasal surgery, ear infection, or blocked ears. Supplies needed:  Saline solution or powder.  Distilled or sterile water may be needed to mix with saline powder. ? You may use boiled and cooled tap water. Boil tap water for 5 minutes; cool until it is lukewarm. Use within 24 hours. ? Do not use regular tap water to mix with the saline solution.  Neti pot or nasal rinse bottle. These supplies release the saline solution into your nose and through your  sinuses. Neti pots and nasal rinse bottles can be purchased at [employer], a health food store, or online. How to perform a sinus rinse  14. Wash your hands with soap and water. 19. Wash your device according to the directions that came with the product and then dry it. 16. Use the solution that comes with your product or one that is sold separately in stores. Follow the mixing directions on the package if you need to mix with sterile or distilled water. 17. Fill the device with the amount of saline solution noted in the device instructions. 18. Stand over a sink and tilt your head sideways over the sink. 19. Place the spout of the device in your upper nostril (the one closer to the ceiling). 20. Gently pour or squeeze the saline solution into your nasal cavity. The liquid should drain out from the lower nostril if you are not too congested. 21. While rinsing, breathe through your open mouth. 22. Gently blow your nose to clear any mucus and rinse solution. Blowing too hard may cause ear pain. 23. Repeat in your other nostril. 24. Clean and rinse your device with clean water and then air-dry it. Talk with your health care provider or pharmacist if you have questions about how to do a sinus rinse. Summary  A sinus rinse is a home treatment that is used to rinse your sinuses with a sterile mixture of salt and water (saline solution).  A sinus rinse is generally safe and effective. Follow all instructions carefully.  Before doing a sinus rinse, talk with your health care provider about whether it would be helpful for you. This information is not intended to replace advice given to you by your health care provider. Make sure you discuss any questions you have with your health care provider. Document Released: 01/21/2014 Document Revised: 04/23/2017 Document Reviewed: 04/23/2017 Elsevier Interactive Patient Education  2019 [employer].

## 2018-11-12 NOTE — Progress Notes (Signed)
Subjective:    Patient ID: Kelly Reed, female    DOB: Jul 20, 1965, 53 y.o.   MRN: [id number]  52y/o Caucasian established female pt c/o sneezing, itchy throat, nonproductive cough x2 days. Today she noticed she was loosing her voice intermittently, becoming hoarse and maxillary and frontal sinus pain/pressure. Also noticed she is more easily winded than usual. Walking to clinic from department, she felt out of breath. Also wearing fabric mask on walk to clinic. ShOB resolves with rest. She has used Rx'd inhalers today e.g. Breo, including albuterol x1.  I also used the nose spray they gave me a long time ago and it does help but I forgot this am so I took a Benadryl instead. She feels that  "it is moving into my chest." Cough becoming productive today. She endorses rhinorrhea, but denies nasal congestion or otalgia.   Patient required prednisone Aug 2019, Dec 2019, Feb 2020, Mar 2020 and azithromycin Mar 2020.  Hasn't been taking antihistamine or nasal sprays daily only prn.  New mask wear at work required due to covid-19 pandemic and more frequent cleaning new chemicals in workplace.  Noticed worsening symptoms when outside improved when indoors at home or after benadryl prn or her nasal spray from [facility] but she forgot it at home today.  Forehead and behind eyes pressure and upper gums hurting.  Denied fever/chills.  Symptoms worsening driving in car and walking outside this am.  Denied loss of smell, body aches, GI upset, rash.     Review of Systems  Constitutional: Positive for activity change and fatigue. Negative for appetite change, chills, diaphoresis and fever.  HENT: Positive for congestion, postnasal drip, rhinorrhea, sinus pressure, sinus pain, sneezing, sore throat and voice change. Negative for dental problem, drooling, ear discharge, ear pain, facial swelling, hearing loss, mouth sores, nosebleeds, tinnitus and trouble swallowing.   Eyes: Positive for redness and itching. Negative for  photophobia, pain and visual disturbance.  Respiratory: Positive for cough, chest tightness, shortness of breath and wheezing. Negative for apnea, choking and stridor.   Cardiovascular: Negative for palpitations.  Gastrointestinal: Negative for diarrhea, nausea and vomiting.  Endocrine: Negative for cold intolerance and heat intolerance.  Genitourinary: Negative for difficulty urinating.  Musculoskeletal: Negative for gait problem, joint swelling, neck pain and neck stiffness.  Skin: Negative for color change, pallor, rash and wound.  Allergic/Immunologic: Positive for environmental allergies and immunocompromised state. Negative for food allergies.  Neurological: Negative for dizziness, tremors, seizures, syncope, facial asymmetry, speech difficulty, weakness and numbness.  Hematological: Negative for adenopathy. Does not bruise/bleed easily.  Psychiatric/Behavioral: Negative for behavioral problems, confusion and sleep disturbance.       Objective:   Physical Exam Vitals signs and nursing note reviewed.  Constitutional:      General: She is awake. She is not in acute distress.    Appearance: Normal appearance. She is well-developed and well-groomed. She is ill-appearing. She is not toxic-appearing or diaphoretic.  HENT:     Head: Normocephalic and atraumatic.     Jaw: There is normal jaw occlusion. No trismus.     Salivary Glands: Right salivary gland is not diffusely enlarged or tender. Left salivary gland is not diffusely enlarged or tender.     Right Ear: Hearing, ear canal and external ear normal. A middle ear effusion is present. There is no impacted cerumen.     Left Ear: Hearing, ear canal and external ear normal. A middle ear effusion is present. There is no impacted cerumen.  Nose: Mucosal edema, congestion and rhinorrhea present. No nasal deformity, septal deviation, laceration or nasal tenderness.     Right Turbinates: Enlarged and swollen. Not pale.     Left Turbinates:  Enlarged and swollen. Not pale.     Right Sinus: Frontal sinus tenderness present. No maxillary sinus tenderness.     Left Sinus: Frontal sinus tenderness present. No maxillary sinus tenderness.     Mouth/Throat:     Lips: Pink. No lesions.     Mouth: Mucous membranes are moist. Mucous membranes are not pale, not dry and not cyanotic. No lacerations, oral lesions or angioedema.     Dentition: Normal dentition. Does not have dentures. No gingival swelling, dental caries, dental abscesses or gum lesions.     Tongue: No lesions.     Palate: No lesions.     Pharynx: Uvula midline. Pharyngeal swelling and posterior oropharyngeal erythema present. No oropharyngeal exudate or uvula swelling.     Tonsils: No tonsillar exudate or tonsillar abscesses. 0 on the right. 0 on the left.     Comments: Bilateral TMs air fluid level clear; cobblestoning posterior pharynx; bilateral allergic shiners; clear discharge bilateral nasal turbinates frontal sinuses exquisitely TTP bilaterally Eyes:     General: Lids are normal. Allergic shiner present. No visual field deficit or scleral icterus.       Right eye: No foreign body, discharge or hordeolum.        Left eye: No foreign body, discharge or hordeolum.     Extraocular Movements: Extraocular movements intact.     Right eye: Normal extraocular motion and no nystagmus.     Left eye: Normal extraocular motion and no nystagmus.     Conjunctiva/sclera: Conjunctivae normal.     Right eye: Right conjunctiva is not injected. No chemosis, exudate or hemorrhage.    Left eye: Left conjunctiva is not injected. No chemosis, exudate or hemorrhage.    Pupils: Pupils are equal, round, and reactive to light. Pupils are equal.     Right eye: Pupil is round and reactive.     Left eye: Pupil is round and reactive.  Neck:     Musculoskeletal: Normal range of motion and neck supple. Normal range of motion. No edema, erythema, neck rigidity, crepitus, pain with movement,  torticollis, spinous process tenderness or muscular tenderness.     Thyroid: No thyroid mass or thyromegaly.     Trachea: Trachea and phonation normal. No tracheal tenderness or tracheal deviation.  Cardiovascular:     Rate and Rhythm: Normal rate and regular rhythm.     Chest Wall: PMI is not displaced.     Pulses: Normal pulses.     Heart sounds: Normal heart sounds, S1 normal and S2 normal. No murmur. No friction rub. No gallop.   Pulmonary:     Effort: Pulmonary effort is normal. No accessory muscle usage, prolonged expiration or respiratory distress.     Breath sounds: Normal breath sounds and air entry. No stridor, decreased air movement or transmitted upper airway sounds. No decreased breath sounds, wheezing, rhonchi or rales.     Comments: Coarse breath sounds middle lobes bilaterally; intermittent nonproductive cough observed in exam room; spoke full sentences without difficulty Chest:     Chest wall: No tenderness.  Abdominal:     General: There is no distension.     Palpations: Abdomen is soft.  Musculoskeletal: Normal range of motion.        General: No tenderness.     Right shoulder: Normal.  Left shoulder: Normal.     Right elbow: Normal.    Left elbow: Normal.     Right hip: Normal.     Left hip: Normal.     Right knee: Normal.     Left knee: Normal.     Cervical back: Normal.     Thoracic back: Normal.     Lumbar back: Normal.     Right hand: Normal.     Left hand: Normal.     Right lower leg: No edema.     Left lower leg: No edema.  Lymphadenopathy:     Head:     Right side of head: No submental, submandibular, tonsillar, preauricular, posterior auricular or occipital adenopathy.     Left side of head: No submental, submandibular, tonsillar, preauricular, posterior auricular or occipital adenopathy.     Cervical: No cervical adenopathy.     Right cervical: No superficial, deep or posterior cervical adenopathy.    Left cervical: No superficial, deep or  posterior cervical adenopathy.  Skin:    General: Skin is warm and moist.     Capillary Refill: Capillary refill takes less than 2 seconds.     Coloration: Skin is not ashen, cyanotic, jaundiced, mottled, pale or sallow.     Findings: No abrasion, abscess, acne, bruising, burn, ecchymosis, erythema, signs of injury, laceration, lesion, petechiae, rash or wound.     Nails: There is no clubbing.      Comments: Facial and neck skin slightly damp to touch during physical exam; no droplets visually noted of sweat  Neurological:     General: No focal deficit present.     Mental Status: She is alert and oriented to person, place, and time. Mental status is at baseline. She is not disoriented.     GCS: GCS eye subscore is 4. GCS verbal subscore is 5. GCS motor subscore is 6.     Cranial Nerves: Cranial nerves are intact. No cranial nerve deficit, dysarthria or facial asymmetry.     Sensory: Sensation is intact. No sensory deficit.     Motor: Motor function is intact. No weakness, tremor, atrophy, abnormal muscle tone or seizure activity.     Coordination: Coordination is intact. Coordination normal.     Gait: Gait is intact. Gait normal.     Comments: Gait sure and steady in hallway; on/off exam table and in/out of chair without difficulty  Psychiatric:        Attention and Perception: Attention and perception normal.        Mood and Affect: Mood and affect normal.        Speech: Speech normal.        Behavior: Behavior normal. Behavior is cooperative.        Thought Content: Thought content normal.        Cognition and Memory: Cognition and memory normal.        Judgment: Judgment normal.           Assessment & Plan:  A-acute recurrent frontal sinusitis; acute bronchitis recurrent secondary to pollen; seasonal allergic rhinitis  P-Patient may use normal saline nasal spray 2 sprays each nostril q2h wa as needed given 1 bottle from clinic stock. flonase 27mcg 1 spray each nostril BID at  home.  Patient denied personal or family history of ENT cancer.  OTC antihistamine of choice xyzal 5mg  po daily or benadryl 25mg  po TID prn rhinitis.  Refused phenylephrine at this time.Marland Kitchen  Avoid triggers if possible.  Shower prior to  bedtime if exposed to triggers.  If allergic dust/dust mites recommend mattress/pillow covers/encasements; washing linens, vacuuming, sweeping, dusting weekly.  Call or return to clinic as needed if these symptoms worsen or fail to improve as anticipated.   Exitcare handout on allergic rhinitis and sinus rinse given to patient.  Patient verbalized understanding of instructions, agreed with plan of care and had no further questions at this time.  P2:  Avoidance and hand washing.  Restart flonase 1 spray each nostril BID, saline 2 sprays each nostril q2h wa prn congestion.  If no improvement with 48 hours of saline and flonase and prednisone use start azithromycin 250mg  take 2 tabs day one then take 1 tab days 2-5 #6 RF0 dispensed from [facility] to patient.   Denied personal or family history of ENT cancer.  Shower BID especially prior to bed. No evidence of systemic bacterial infection, non toxic and well hydrated.  I do not see where any further testing or imaging is necessary at this time.   I will suggest supportive care, rest, good hygiene and encourage the patient to take adequate fluids.  The patient is to return to clinic or EMERGENCY ROOM if symptoms worsen or change significantly.  Exitcare handout on sinusitis and sinus rinse.  Patient verbalized agreement and understanding of treatment plan and had no further questions at this time.   P2:  Hand washing and cover cough  Continue mask wear. Cough lozenges po q2h prn cough given 8 UD from clinic stock.  Prednisone taper 10mg  (40x4d/30x1d/20x1d) po daily with breakfast #21 RF0 dispensed from [facility].  Discussed possible side effects increased/decreased appetite, difficulty sleeping, increased blood sugar, increased blood pressure  and heart rate.  Albuterol MDI 53mcg 1-2 puffs po q4-6h prn protracted cough/wheeze at home side effect increased heart rate. Bronchitis simple, community acquired, may have started as viral (probably respiratory syncytial, parainfluenza, influenza, or adenovirus), but now evidence of acute purulent bronchitis with resultant bronchial edema and mucus formation.  Viruses are the most common cause of bronchial inflammation in otherwise healthy adults with acute bronchitis.  The appearance of sputum is not predictive of whether a bacterial infection is present.  Purulent sputum is most often caused by viral infections.  There are a small portion of those caused by non-viral agents being Mycoplama pneumonia.  Microscopic examination or C&S of sputum in the healthy adult with acute bronchitis is generally not helpful (usually negative or normal respiratory flora) other considerations being cough from upper respiratory tract infections, sinusitis or allergic syndromes (mild asthma or viral pneumonia).  Differential Diagnoses:  reactive airway disease (asthma, allergic aspergillosis (eosinophilia), chronic bronchitis, respiratory infection (sinusitis, common cold, pneumonia), congestive heart failure, reflux esophagitis, bronchogenic tumor, aspiration syndromes and/or exposure to pulmonary irritants/smoke.  If symptoms worsening with prednisone stop prednisone as have seen with Covid-19 worsening with steroids.  Discussed with patient virus is common in community at this time.  Continue to shelter at home when not required at work.   Without high fever, severe dyspnea, lack of physical findings or other risk factors, I will hold on a chest radiograph and CBC at this time.  I discussed that approximately 50% of patients with acute bronchitis have a cough that lasts up to three weeks, and 25% for over a month.  Tylenol 500mg  one to two tablets every four to six hours as needed for fever or myalgias.  No aspirin. Exitcare  handout on bronchitis and inhaler use given to patient.  ER if hemopthysis, SOB, worst  chest pain of life.   Patient instructed to follow up in one week or sooner if symptoms worsen.  Patient verbalized agreement and understanding of treatment plan.  P2:  hand washing and cover cough

## 2018-11-14 ENCOUNTER — Encounter: Payer: Self-pay | Admitting: Registered Nurse

## 2018-11-14 ENCOUNTER — Telehealth: Payer: Self-pay | Admitting: Registered Nurse

## 2018-11-14 NOTE — Telephone Encounter (Signed)
Patient reported prednisone 40mg  po qam working well nasal passages have dried up and stopped antihistamine.  Still using nasal saline less at work due to mask use.  Feeling better. A little sore throat this am but slept with the windows open last night. Didn't have to take antibiotic.  Denied fever/chills/new symptoms.  Continue plan of care as previously discussed taper off prednisone over the weekend.  Follow up with RN Hildred Alamin if new or worsening symptoms for re-evaluation/schedule NP appt.  Patient verbalized understanding information/instructions, agreed with plan of care and had no further questions at this time.

## 2018-11-25 ENCOUNTER — Other Ambulatory Visit: Payer: Self-pay

## 2018-11-25 ENCOUNTER — Telehealth (INDEPENDENT_AMBULATORY_CARE_PROVIDER_SITE_OTHER): Payer: PRIVATE HEALTH INSURANCE | Admitting: Family Medicine

## 2018-11-25 DIAGNOSIS — E1165 Type 2 diabetes mellitus with hyperglycemia: Secondary | ICD-10-CM | POA: Diagnosis not present

## 2018-11-25 DIAGNOSIS — J4540 Moderate persistent asthma, uncomplicated: Secondary | ICD-10-CM

## 2018-11-25 DIAGNOSIS — F32A Depression, unspecified: Secondary | ICD-10-CM

## 2018-11-25 DIAGNOSIS — I10 Essential (primary) hypertension: Secondary | ICD-10-CM

## 2018-11-25 DIAGNOSIS — F329 Major depressive disorder, single episode, unspecified: Secondary | ICD-10-CM

## 2018-11-25 DIAGNOSIS — F419 Anxiety disorder, unspecified: Secondary | ICD-10-CM

## 2018-11-25 DIAGNOSIS — E7800 Pure hypercholesterolemia, unspecified: Secondary | ICD-10-CM

## 2018-11-25 MED ORDER — ATORVASTATIN CALCIUM 20 MG PO TABS: 20.0000 mg | ORAL_TABLET | Freq: Every day | ORAL | 1 refills | Status: DC

## 2018-11-25 MED ORDER — METFORMIN HCL 1000 MG PO TABS: 1000.0000 mg | ORAL_TABLET | Freq: Two times a day (BID) | ORAL | 0 refills | Status: DC

## 2018-11-25 MED ORDER — LOSARTAN POTASSIUM 50 MG PO TABS: ORAL_TABLET | ORAL | 1 refills | Status: DC

## 2018-11-25 MED ORDER — ATORVASTATIN CALCIUM 10 MG PO TABS: ORAL_TABLET | ORAL | 3 refills | Status: DC

## 2018-11-25 NOTE — Progress Notes (Signed)
Virtual Visit Note  I connected with patient on 11/25/18 at 328 pm by phone and verified that I am speaking with the correct person using two identifiers. Kelly Reed is currently located at home and patient is currently with them during visit. The provider, Rutherford Guys, MD is located in their office at time of visit.  I discussed the limitations, risks, security and privacy concerns of performing an evaluation and management service by telephone and the availability of in person appointments. I also discussed with the patient that there may be a patient responsible charge related to this service. The patient expressed understanding and agreed to proceed.   CC: 6 months follouwp  HPI ? Patient is a 53 y.o. female with past medical history significant for asthma, HTN, DM2, seasonal allergies, HLP, who presents today for routine followup  Last OV Oct 2019 - no changes made Since had CPE thru work and seen allergy clinic  Last A1c related to multiple prednisone bursts Has been checking cbgs daily Has been slowly coming down This morning 120  BP has been running high Normally does not take anything for her BP  Has not been able to exercise much Usually walks her dog 30 mins on most days  Depression well controlled on effexor Her mother died a year ago Now doing much better  BP Readings from Last 3 Encounters:  11/12/18 (!) 152/93  10/18/18 (!) 148/95  09/19/18 (!) 164/94    Lab Results  Component Value Date   HGBA1C 7.2 (H) 10/22/2018   HGBA1C 6.6 (A) 04/29/2018   HGBA1C 6.6 09/12/2017   Lab Results  Component Value Date   GLUF 178 (A) 03/20/2012   MICROALBUR 0.3 09/16/2015   LDLCALC 139 (H) 10/22/2018   CREATININE 0.65 10/22/2018     Allergies  Allergen Reactions  . Adhesive [Tape] Rash and Other (See Comments)    Peeling of skin  . Prednisone     Hyperglycemia with oral steroids    Prior to Admission medications   Medication Sig Start Date End Date  Taking? Authorizing Provider  ACCU-CHEK FASTCLIX LANCETS MISC U UTD TO CHECK BG TID 05/11/18   [provider]  ACCU-CHEK GUIDE test strip U UTD TO CHECK BG TID 04/30/18   [provider]  albuterol (VENTOLIN HFA) 108 (90 Base) MCG/ACT inhaler INHALE 2 PUFFS BY MOUTH INTO THE LUNGS EVERY 6 HOURS AS NEEDED WHEEZING OR SHORTNESS OF BREATH 12/31/17   Shawnee Knapp, MD  aspirin EC 81 MG tablet Take 1 tablet (81 mg total) by mouth daily. 08/07/14   Shawnee Knapp, MD  atorvastatin (LIPITOR) 10 MG tablet TAKE 1 TABLET(10 MG) BY MOUTH DAILY 11/21/16   Shawnee Knapp, MD  Blood Glucose Monitoring Suppl (ACCU-CHEK GUIDE ME) w/Device KIT U UTD TO CHECK BG TID 05/11/18   [provider]  BREO ELLIPTA 200-25 MCG/INH AEPB INL 1 PUFF PO QD 11/24/17   [provider]  diclofenac (VOLTAREN) 75 MG EC tablet TAKE 1 TABLET(75 MG) BY MOUTH TWICE DAILY 04/30/18   Rutherford Guys, MD  Epinastine HCl 0.05 % ophthalmic solution Place 1 drop into both eyes 2 (two) times daily. 09/21/16   Shawnee Knapp, MD  ferrous sulfate 325 (65 FE) MG tablet Take 325 mg by mouth every other day.     [provider]  fluticasone (FLONASE) 50 MCG/ACT nasal spray Place into both nostrils daily.    [provider]  levocetirizine (XYZAL) 5 MG  tablet Take 1 tablet by mouth every evening.  03/08/17   [provider]  losartan (COZAAR) 25 MG tablet TAKE 1 TABLET(25 MG) BY MOUTH DAILY 05/20/18   Shawnee Knapp, MD  MAGNESIUM PO Take by mouth.    [provider]  metFORMIN (GLUCOPHAGE) 1000 MG tablet TAKE 1 TABLET(1000 MG) BY MOUTH TWICE DAILY WITH A MEAL 08/21/18   Rutherford Guys, MD  montelukast (SINGULAIR) 10 MG tablet Take 1 tablet (10 mg total) by mouth at bedtime. 04/29/18   Rutherford Guys, MD  Multiple Vitamins-Minerals (CENTRUM ADULTS PO) Take by mouth.    [provider]  sodium chloride (OCEAN) 0.65 % SOLN nasal spray Place 2 sprays into both nostrils every 2 (two) hours  while awake for 30 days. 11/12/18 12/12/18  Betancourt, Aura Fey, NP  venlafaxine XR (EFFEXOR-XR) 150 MG 24 hr capsule TAKE 1 CAPSULE BY MOUTH DAILY WITH BREAKFAST 11/11/18   Betancourt, Tina A, NP  vitamin C (ASCORBIC ACID) 500 MG tablet Take 500 mg by mouth daily.    [provider]    Past Medical History:  Diagnosis Date  . Anemia   . Anxiety   . Asthma   . Diabetes mellitus type II   . Hyperlipidemia   . Hypertension   . NSVD (normal spontaneous vaginal delivery)    x 2    Past Surgical History:  Procedure Laterality Date  . 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 History of Abd. ultrasound  12/04   negative  . Surg. eval lap cholecstectomy  05/28/2006   deferred by patient  . TUBAL LIGATION  1993   bilateral    Social History   Tobacco Use  . Smoking status: Never Smoker  . Smokeless tobacco: Never Used  Substance Use Topics  . Alcohol use: Yes    Alcohol/week: 1.0 - 2.0 standard drinks    Types: 1 - 2 Standard drinks or equivalent per week    Comment: Occasional beer    Family History  Problem Relation Age of Onset  . Breast cancer Mother 47       pt's mother passed away from breast cancer when pt was 53 yo  . Diabetes Maternal Grandmother     ROS Denies any fever, chills, cough, sob, chest pain, edema, nausea, vomiting, abd pain  Objective  Vitals as reported by the patient: none   ASSESSMENT and PLAN  1. Type 2 diabetes mellitus with hyperglycemia, without long-term current use of insulin (HCC) Above goal in setting of multiple prednisone bursts. cbgs starting to come down. Recheck a1c at 3 month interval, consider adding second medication, cont w LFM - Hemoglobin A1c; Future - Comprehensive metabolic panel; Future  2. Essential hypertension, benign Not controlled Increased losartan to 66m once a day  3. Hypercholesteremia Not controlled, LDL > 100 Increasing atorvastatin to 20 mg Recheck labs prior to next OV - Lipid panel;  Future  4. Moderate persistent asthma without complication Managed by allergy and asthma Has struggled with control this year requiring multiple pred burts  5. Anxiety and depression Well controlled on Effexor.  Other orders - metFORMIN (GLUCOPHAGE) 1000 MG tablet; Take 1 tablet (1,000 mg total) by mouth 2 (two) times daily with a meal. - losartan (COZAAR) 50 MG tablet; TAKE 1 TABLET(25 MG) BY MOUTH DAILY - atorvastatin (LIPITOR) 20 MG tablet; Take 1 tablet (20 mg total) by mouth daily at 6 PM.  FOLLOW-UP: 2 months with fasting labs 2-3 days  prior   The above assessment and management plan was discussed with the patient. The patient verbalized understanding of and has agreed to the management plan. Patient is aware to call the clinic if symptoms persist or worsen. Patient is aware when to return to the clinic for a follow-up visit. Patient educated on when it is appropriate to go to the emergency department.    I provided 20 minutes of non-face-to-face time during this encounter.  Rutherford Guys, MD Primary [facility] at [place], [place] [address] Ph.  [phone] Fax [phone]

## 2018-11-25 NOTE — Progress Notes (Signed)
TOC from [facility], pt is needing refills on pended medication. No medical concern today

## 2018-12-16 ENCOUNTER — Other Ambulatory Visit: Payer: Self-pay | Admitting: Registered Nurse

## 2018-12-30 ENCOUNTER — Telehealth: Payer: Self-pay | Admitting: Family Medicine

## 2018-12-30 NOTE — Telephone Encounter (Signed)
Medication: venlafaxine XR (EFFEXOR-XR) 150 MG 24 hr capsule   Patient is requesting a refill of this medication.   Preferred Pharmacy (with phone number or street name):[id number] [address], [place] AT [phone] (Phone) [phone] (Fax)

## 2019-01-01 ENCOUNTER — Telehealth: Payer: Self-pay | Admitting: *Deleted

## 2019-01-01 ENCOUNTER — Other Ambulatory Visit: Payer: Self-pay | Admitting: *Deleted

## 2019-01-01 MED ORDER — VENLAFAXINE HCL ER 150 MG PO CP24: ORAL_CAPSULE | ORAL | 0 refills | Status: DC

## 2019-01-01 NOTE — Telephone Encounter (Signed)
Please advise patient medication has been sent

## 2019-01-01 NOTE — Telephone Encounter (Signed)
Spoke with patient prescription sent for 30 days patient has an appointment 01/31/2019 with [place]

## 2019-01-16 ENCOUNTER — Telehealth: Payer: Self-pay | Admitting: Registered Nurse

## 2019-01-16 ENCOUNTER — Other Ambulatory Visit: Payer: Self-pay

## 2019-01-16 VITALS — BP 147/86 | HR 69 | Temp 98.2°F

## 2019-01-16 DIAGNOSIS — H1033 Unspecified acute conjunctivitis, bilateral: Secondary | ICD-10-CM

## 2019-01-16 MED ORDER — ERYTHROMYCIN 5 MG/GM OP OINT: 1.0000 "application " | TOPICAL_OINTMENT | Freq: Four times a day (QID) | OPHTHALMIC | 0 refills | Status: AC

## 2019-01-16 MED ORDER — ERYTHROMYCIN 5 MG/GM OP OINT: 1.0000 "application " | TOPICAL_OINTMENT | Freq: Four times a day (QID) | OPHTHALMIC | 0 refills | Status: DC

## 2019-01-16 MED ORDER — REFRESH PLUS 0.5 % OP SOLN: 1.0000 [drp] | Freq: Three times a day (TID) | OPHTHALMIC | 0 refills | Status: AC | PRN

## 2019-01-16 NOTE — Patient Instructions (Signed)
Viral Conjunctivitis, Adult  Viral conjunctivitis is an inflammation of the clear membrane that covers the white part of your eye and the inner surface of your eyelid (conjunctiva). The inflammation is caused by a viral infection. The blood vessels in the conjunctiva become inflamed, causing the eye to become red or pink, and often itchy. Viral conjunctivitis can be easily passed from one person to another (is contagious). This condition is often called pink eye. What are the causes? This condition is caused by a virus. A virus is a type of contagious germ. It can be spread by touching objects that have been contaminated with the virus, such as doorknobs or towels. It can also be passed through droplets, such as from coughing or sneezing. What are the signs or symptoms? Symptoms of this condition include:  Eye redness.  Tearing or watery eyes.  Itchy and irritated eyes.  Burning feeling in the eyes.  Clear drainage from the eye.  Swollen eyelids.  A gritty feeling in the eye.  Light sensitivity. This condition often occurs with other symptoms, such as a fever, nausea, or a rash. How is this diagnosed? This condition is diagnosed with a medical history and physical exam. If you have discharge from your eye, the discharge may be tested to rule out other causes of conjunctivitis. How is this treated? Viral conjunctivitis does not respond to medicines that kill bacteria (antibiotics). Treatment for viral conjunctivitis is directed at stopping a bacterial infection from developing in addition to the viral infection. Treatment also aims to relieve your symptoms, such as itching. This may be done with antihistamine drops or other eye medicines. Rarely, steroid eye drops or antiviral medicines may be prescribed. Follow these instructions at home: Medicines   Take or apply over-the-counter and prescription medicines only as told by your health care provider.  Be very careful to avoid  touching the edge of the eyelid with the eye drop bottle or ointment tube when applying medicines to the affected eye. Being careful this way will stop you from spreading the infection to the other eye or to other people. Eye care  Avoid touching or rubbing your eyes.  Apply a warm, wet, clean washcloth to your eye for 10-20 minutes, 3-4 times per day or as told by your health care provider.  If you wear contact lenses, do not wear them until the inflammation is gone and your health care provider says it is safe to wear them again. Ask your health care provider how to sterilize or replace your contact lenses before using them again. Wear glasses until you can resume wearing contacts.  Avoid wearing eye makeup until the inflammation is gone. Throw away any old eye cosmetics that may be contaminated.  Gently wipe away any drainage from your eye with a warm, wet washcloth or a cotton ball. General instructions  Change or wash your pillowcase every day or as told by your health care provider.  Do not share towels, pillowcases, washcloths, eye makeup, makeup brushes, contact lenses, or glasses. This may spread the infection.  Wash your hands often with soap and water. Use paper towels to dry your hands. If soap and water are not available, use hand sanitizer.  Try to avoid contact with other people for one week or as told by your health care provider. Contact a health care provider if:  Your symptoms do not improve with treatment or they get worse.  You have increased pain.  Your vision becomes blurry.  You have a   fever.  You have facial pain, redness, or swelling.  You have yellow or green drainage coming from your eye.  You have new symptoms. This information is not intended to replace advice given to you by your health care provider. Make sure you discuss any questions you have with your health care provider. Document Released: 09/16/2002 Document Revised: 10/15/2018 Document  Reviewed: 01/11/2016 Elsevier Patient Education  [place]. Bacterial Conjunctivitis, Adult Bacterial conjunctivitis is an infection of the clear membrane that covers the white part of your eye and the inner surface of your eyelid (conjunctiva). When the blood vessels in your conjunctiva become inflamed, your eye becomes red or pink, and it will probably feel itchy. Bacterial conjunctivitis spreads very easily from person to person (is contagious). It also spreads easily from one eye to the other eye. What are the causes? This condition is caused by bacteria. You may get the infection if you come into close contact with:  A person who is infected with the bacteria.  Items that are contaminated with the bacteria, such as a face towel, contact lens solution, or eye makeup. What increases the risk? You are more likely to develop this condition if you:  Are exposed to other people who have the infection.  Wear contact lenses.  Have a sinus infection.  Have had a recent eye injury or surgery.  Have a weak body defense system (immune system).  Have a medical condition that causes dry eyes. What are the signs or symptoms? Symptoms of this condition include:  Thick, yellowish discharge from the eye. This may turn into a crust on the eyelid overnight and cause your eyelids to stick together.  Tearing or watery eyes.  Itchy eyes.  Burning feeling in your eyes.  Eye redness.  Swollen eyelids.  Blurred vision. How is this diagnosed? This condition is diagnosed based on your symptoms and medical history. Your health care provider may also take a sample of discharge from your eye to find the cause of your infection. This is rarely done. How is this treated? This condition may be treated with:  Antibiotic eye drops or ointment to clear the infection more quickly and prevent the spread of infection to others.  Oral antibiotic medicines to treat infections that do not respond  to drops or ointments or that last longer than 10 days.  Cool, wet cloths (cool compresses) placed on the eyes.  Artificial tears applied 2-6 times a day. Follow these instructions at home: Medicines  Take or apply your antibiotic medicine as told by your health care provider. Do not stop taking or applying the antibiotic even if you start to feel better.  Take or apply over-the-counter and prescription medicines only as told by your health care provider.  Be very careful to avoid touching the edge of your eyelid with the eye-drop bottle or the ointment tube when you apply medicines to the affected eye. This will keep you from spreading the infection to your other eye or to other people. Managing discomfort  Gently wipe away any drainage from your eye with a warm, wet washcloth or a cotton ball.  Apply a clean, cool compress to your eye for 10-20 minutes, 3-4 times a day. General instructions  Do not wear contact lenses until the inflammation is gone and your health care provider says it is safe to wear them again. Ask your health care provider how to sterilize or replace your contact lenses before you use them again. Wear glasses until you  can resume wearing contact lenses.  Avoid wearing eye makeup until the inflammation is gone. Throw away any old eye cosmetics that may be contaminated.  Change or wash your pillowcase every day.  Do not share towels or washcloths. This may spread the infection.  Wash your hands often with soap and water. Use paper towels to dry your hands.  Avoid touching or rubbing your eyes.  Do not drive or use heavy machinery if your vision is blurred. Contact a health care provider if:  You have a fever.  Your symptoms do not get better after 10 days. Get help right away if you have:  A fever and your symptoms suddenly get worse.  Severe pain when you move your eye.  Facial pain, redness, or swelling.  Sudden loss of vision. Summary  Bacterial  conjunctivitis is an infection of the clear membrane that covers the white part of your eye and the inner surface of your eyelid (conjunctiva).  Bacterial conjunctivitis spreads very easily from person to person (is contagious).  Wash your hands often with soap and water. Use paper towels to dry your hands.  Take or apply your antibiotic medicine as told by your health care provider. Do not stop taking or applying the antibiotic even if you start to feel better.  Contact a health care provider if you have a fever or your symptoms do not get better after 10 days. This information is not intended to replace advice given to you by your health care provider. Make sure you discuss any questions you have with your health care provider. Document Released: 06/26/2005 Document Revised: 10/15/2018 Document Reviewed: 01/30/2018 Elsevier Patient Education  [place]. Allergic Conjunctivitis, Adult  Allergic conjunctivitis is inflammation of the clear membrane that covers the white part of your eye and the inner surface of your eyelid (conjunctiva). The inflammation is caused by allergies. The blood vessels in the conjunctiva become inflamed and this causes the eyes to become red or pink. The eyes often feel itchy. Allergic conjunctivitis cannot be spread from one person to another person (is not contagious). What are the causes? This condition is caused by an allergic reaction. Common causes of an allergic reaction (allergens) include:  Outdoor allergens, such as: ? Pollen. ? Grass and weeds. ? Mold spores.  Indoor allergens, such as: ? Dust. ? Smoke. ? Mold. ? Pet dander. ? Animal hair. What increases the risk? You may be more likely to develop this condition if you have a family history of allergies, such as:  Allergic rhinitis.  Bronchial asthma.  Atopic dermatitis. What are the signs or symptoms? Symptoms of this condition include eyes that are:  Itchy.  Red.  Watery.   Puffy. Your eyes may also:  Sting or burn.  Have clear drainage coming from them. How is this diagnosed? This condition may be diagnosed by medical history and physical exam. If you have drainage from your eyes, it may be tested to rule out other causes of conjunctivitis. You may also need to see a health care provider who specializes in treating allergies (allergist) or eye conditions (ophthalmologist) for tests to confirm the diagnosis. You may have:  Skin tests to see which allergens are causing your symptoms. These tests involve pricking the skin with a tiny needle and exposing the skin to small amounts of potential allergens to see if your skin reacts.  Blood tests.  Tissue scrapings from your eyelid. These will be examined under a microscope. How is this treated? Treatments  for this condition may include:  Cold cloths (compresses) to soothe itching and swelling.  Washing the face to remove allergens.  Eye drops. These may be prescription or over-the-counter. There are several different types. You may need to try different types to see which one works best for you. Your may need: ? Eye drops that block the allergic reaction (antihistamine). ? Eye drops that reduce swelling and irritation (anti-inflammatory). ? Steroid eye drops to lessen a severe reaction (vernal conjunctivitis).  Oral antihistamine medicines to reduce your allergic reaction. You may need these if eye drops do not help or are difficult to use. Follow these instructions at home:  Avoid known allergens whenever possible.  Take or apply over-the-counter and prescription medicines only as told by your health care provider. These include any eye drops.  Apply a cool, clean washcloth to your eye for 10-20 minutes, 3-4 times a day.  Do not touch or rub your eyes.  Do not wear contact lenses until the inflammation is gone. Wear glasses instead.  Do not wear eye makeup until the inflammation is gone.  Keep all  follow-up visits as told by your health care provider. This is important. Contact a health care provider if:  Your symptoms get worse or do not improve with treatment.  You have mild eye pain.  You have sensitivity to light.  You have spots or blisters on your eyes.  You have pus draining from your eye.  You have a fever. Get help right away if:  You have redness, swelling, or other symptoms in only one eye.  Your vision is blurred or you have vision changes.  You have severe eye pain. This information is not intended to replace advice given to you by your health care provider. Make sure you discuss any questions you have with your health care provider. Document Released: 09/16/2002 Document Revised: 06/08/2017 Document Reviewed: 01/07/2016 Elsevier Patient Education  2020 [employer].

## 2019-01-16 NOTE — Progress Notes (Signed)
Subjective:    Patient ID: Kelly Reed, female    DOB: 1965/12/15, 53 y.o.   MRN: [id number]  52y/o caucasian female established patient here for evaluation of eye discharge yellow in the corners of her eyes, upper eyelid swelling/redness, red eyes and eye pain that began 48 hours ago.  Patient reported redness of eyes, swelling of eyelids and discomfort almost completely resolved after starting erythromycin ointment and cold compresses and saline eye washouts.  Started using left over erythromycin ophthalmic ointment at home Tuesday evening and would like refill today as it and saline eye drops and cold compresses helping.  Changing her pillowcase more frequently.  Denied photophobia, purulent eye discharge, n/v, headache, fever/chills, vision changes, orbital swelling.  Wearing mask and glasses reading at work.  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 patient agreed to video visit     Review of Systems  Constitutional: Negative for activity change, appetite change, chills, diaphoresis, fatigue and fever.  HENT: Negative for trouble swallowing and voice change.   Eyes: Positive for pain, discharge, redness and itching. Negative for photophobia and visual disturbance.  Respiratory: Negative for cough, choking, shortness of breath, wheezing and stridor.   Cardiovascular: Negative for palpitations.  Gastrointestinal: Negative for nausea and vomiting.  Endocrine: Positive for cold intolerance. Negative for heat intolerance.  Genitourinary: Negative for difficulty urinating.  Musculoskeletal: Negative for arthralgias, back pain, gait problem, joint swelling, myalgias, neck pain and neck stiffness.  Skin: Positive for color change and rash. Negative for pallor and wound.   Allergic/Immunologic: Positive for environmental allergies and immunocompromised state. Negative for food allergies.  Neurological: Negative for dizziness, tremors, seizures, syncope, facial asymmetry, speech difficulty, weakness, light-headedness, numbness and headaches.  Hematological: Negative for adenopathy. Does not bruise/bleed easily.  Psychiatric/Behavioral: Negative for agitation, confusion and sleep disturbance.       Objective:   Physical Exam Vitals signs reviewed.  Constitutional:      General: She is awake. She is not in acute distress.    Appearance: Normal appearance. She is well-developed and well-groomed. She is not ill-appearing, toxic-appearing or diaphoretic.  HENT:     Head: Normocephalic and atraumatic. No raccoon eyes, abrasion, contusion, masses, right periorbital erythema, left periorbital erythema or laceration.     Jaw: There is normal jaw occlusion.     Salivary Glands: Right salivary gland is not diffusely enlarged. Left salivary gland is not diffusely enlarged.     Right Ear: Hearing and external ear normal.     Left Ear: Hearing and external ear normal.     Nose: Nose normal.     Mouth/Throat:     Lips: Pink. No lesions.     Mouth: Mucous membranes are moist.  Eyes:     General: Lids are normal. Allergic shiner present. No visual field deficit or scleral icterus.       Right eye: No discharge or hordeolum.        Left eye: No discharge or hordeolum.     Extraocular Movements: Extraocular movements intact.     Conjunctiva/sclera:     Right eye: Right conjunctiva is injected. No chemosis, exudate or hemorrhage.    Left eye: Left conjunctiva is injected. No chemosis, exudate or hemorrhage.    Pupils: Pupils are equal, round, and reactive to light.     Comments: 0-1+/4 bulbar  and eyelid injection bilaterally; no discharge noted in eyelashes or corners of eyes/eyebrows; no macular erythema or papules noted eyelids  Neck:     Musculoskeletal: Normal range  of motion and neck supple. Normal range of motion. No edema, erythema, neck rigidity, crepitus, injury, pain with movement, torticollis or muscular tenderness.     Trachea: Trachea and phonation normal.  Cardiovascular:     Rate and Rhythm: Normal rate and regular rhythm.     Pulses: Normal pulses.  Pulmonary:     Effort: Pulmonary effort is normal. No respiratory distress.     Breath sounds: Normal breath sounds and air entry. No stridor, decreased air movement or transmitted upper airway sounds. No decreased breath sounds, wheezing, rhonchi or rales.     Comments: No cough observed in exam room; spoke full sentences without difficulty Musculoskeletal: Normal range of motion.     Right shoulder: Normal.     Left shoulder: Normal.     Right elbow: Normal.    Left elbow: Normal.     Right hip: Normal.     Left hip: Normal.     Right knee: Normal.     Left knee: Normal.     Cervical back: Normal.     Thoracic back: Normal.     Lumbar back: Normal.     Right hand: Normal.     Left hand: Normal.  Lymphadenopathy:     Head:     Right side of head: No submental, submandibular or preauricular adenopathy.     Left side of head: No submental, submandibular or preauricular adenopathy.     Cervical: No cervical adenopathy.     Right cervical: No superficial cervical adenopathy.    Left cervical: No superficial cervical adenopathy.  Skin:    General: Skin is warm and dry.     Capillary Refill: Capillary refill takes less than 2 seconds.     Coloration: Skin is not ashen, cyanotic, jaundiced, mottled, pale or sallow.     Findings: No abrasion, abscess, acne, bruising, burn, ecchymosis, erythema, signs of injury, laceration, lesion, petechiae, rash or wound.     Nails: There is no clubbing.   Neurological:     General: No focal deficit present.     Mental Status: She is alert and oriented to person, place, and time. Mental status is at baseline.     GCS: GCS eye subscore is 4. GCS verbal  subscore is 5. GCS motor subscore is 6.     Cranial Nerves: Cranial nerves are intact. No cranial nerve deficit, dysarthria or facial asymmetry.     Sensory: Sensation is intact. No sensory deficit.     Motor: Motor function is intact. No weakness, tremor, atrophy, abnormal muscle tone or seizure activity.     Coordination: Coordination is intact. Coordination normal.     Gait: Gait is intact. Gait normal.     Comments: In/out of chair without difficulty; gati sure and steady in clinic  Psychiatric:        Attention and Perception: Attention and perception normal.        Mood and Affect: Mood and affect normal.        Speech: Speech normal.        Behavior: Behavior normal. Behavior is cooperative.        Thought Content: Thought content normal.        Cognition and Memory: Cognition and memory normal.        Judgment: Judgment normal.  Assessment & Plan:  A-acute bilateral conjunctivitis unspecified  P- Cleared for work Nurse, mental health discussed.  Continue her allergic rhinitis medications as prescribed.   Patient to apply warm or cool packs prn right eye 5 minutes TID prn or warm compress.  Refresh drops 2 drops both eyes  TID x 7 days given 5 UD from clinic stock Refilled erythromycin ophthalmic ointment instill 1 cm ribbon both eyes QID x 7 days #1 RF0 dispensed from [facility] to patient.  Discussed blinking irritates eye blood vessels further as eyelid vessels rub on bulbar vessals. Instructed patient to not rub eyes. May need to wash pillowcases more frequently until inflammation resolves especially if discharge noted on pillow. May use over the counter eye drops/tears such as visine or ketotifen per manufacturer instructions for pain/symptom relief. Return to clinic if headache, fever greater than 100.8F, nausea/vomiting, purulent discharge/matting unable to open eye without using fingers after 24 hours of medication use, foreign body sensation, ciliary flush, photophobia or vision  change/worsening. Discussed to see optometrist same day if visual field loss, new light sensitivity, or orbital swelling.  Call or return to clinic as needed if these symptoms worsen or fail to improve as anticipated. Exitcare handout on allergic, bacterial and viral  conjunctivitis. Patient verbalized agreement and understanding of treatment plan and had no further questions at this time.  P2: Hand washing, avoid contact use-wear glasses.

## 2019-01-22 ENCOUNTER — Ambulatory Visit (INDEPENDENT_AMBULATORY_CARE_PROVIDER_SITE_OTHER): Payer: PRIVATE HEALTH INSURANCE | Admitting: Family Medicine

## 2019-01-22 ENCOUNTER — Other Ambulatory Visit: Payer: Self-pay

## 2019-01-22 DIAGNOSIS — E7800 Pure hypercholesterolemia, unspecified: Secondary | ICD-10-CM

## 2019-01-22 DIAGNOSIS — E1165 Type 2 diabetes mellitus with hyperglycemia: Secondary | ICD-10-CM

## 2019-01-23 LAB — LIPID PANEL
Chol/HDL Ratio: 2.7 ratio (ref 0.0–4.4)
Cholesterol, Total: 159 mg/dL (ref 100–199)
HDL: 59 mg/dL (ref >39)
LDL Calculated: 80 mg/dL (ref 0–99)
Triglycerides: 100 mg/dL (ref 0–149)
VLDL Cholesterol Cal: 20 mg/dL (ref 5–40)

## 2019-01-23 LAB — COMPREHENSIVE METABOLIC PANEL
ALT: 42 IU/L — ABNORMAL HIGH (ref 0–32)
AST: 31 IU/L (ref 0–40)
Albumin/Globulin Ratio: 2 (ref 1.2–2.2)
Albumin: 4.5 g/dL (ref 3.8–4.9)
Alkaline Phosphatase: 73 IU/L (ref 39–117)
BUN/Creatinine Ratio: 17 (ref 9–23)
BUN: 10 mg/dL (ref 6–24)
Bilirubin Total: 0.3 mg/dL (ref 0.0–1.2)
CO2: 24 mmol/L (ref 20–29)
Calcium: 9.5 mg/dL (ref 8.7–10.2)
Chloride: 97 mmol/L (ref 96–106)
Creatinine, Ser: 0.6 mg/dL (ref 0.57–1.00)
GFR calc Af Amer: 120 mL/min/{1.73_m2} (ref >59)
GFR calc non Af Amer: 104 mL/min/{1.73_m2} (ref >59)
Globulin, Total: 2.3 g/dL (ref 1.5–4.5)
Glucose: 161 mg/dL — ABNORMAL HIGH (ref 65–99)
Potassium: 4.1 mmol/L (ref 3.5–5.2)
Sodium: 138 mmol/L (ref 134–144)
Total Protein: 6.8 g/dL (ref 6.0–8.5)

## 2019-01-23 LAB — HEMOGLOBIN A1C
Est. average glucose Bld gHb Est-mCnc: 160 mg/dL
Hgb A1c MFr Bld: 7.2 % — ABNORMAL HIGH (ref 4.8–5.6)

## 2019-01-31 ENCOUNTER — Telehealth (INDEPENDENT_AMBULATORY_CARE_PROVIDER_SITE_OTHER): Payer: PRIVATE HEALTH INSURANCE | Admitting: Family Medicine

## 2019-01-31 DIAGNOSIS — E1165 Type 2 diabetes mellitus with hyperglycemia: Secondary | ICD-10-CM

## 2019-01-31 DIAGNOSIS — F419 Anxiety disorder, unspecified: Secondary | ICD-10-CM

## 2019-01-31 DIAGNOSIS — F32A Depression, unspecified: Secondary | ICD-10-CM

## 2019-01-31 DIAGNOSIS — F329 Major depressive disorder, single episode, unspecified: Secondary | ICD-10-CM

## 2019-01-31 DIAGNOSIS — E7800 Pure hypercholesterolemia, unspecified: Secondary | ICD-10-CM

## 2019-01-31 MED ORDER — SITAGLIPTIN PHOSPHATE 100 MG PO TABS: 100.0000 mg | ORAL_TABLET | Freq: Every day | ORAL | 3 refills | Status: DC

## 2019-01-31 MED ORDER — VENLAFAXINE HCL ER 150 MG PO CP24: 150.0000 mg | ORAL_CAPSULE | Freq: Every day | ORAL | 5 refills | Status: DC

## 2019-01-31 NOTE — Patient Instructions (Signed)
° ° ° °  If you have lab work done today you will be contacted with your lab results within the next 2 weeks.  If you have not heard from us then please contact us. The fastest way to get your results is to register for My Chart. ° ° °IF you received an x-ray today, you will receive an invoice from [facility]. Please contact [facility] at [phone] with questions or concerns regarding your invoice.  ° °IF you received labwork today, you will receive an invoice from [facility]. Please contact [facility] at [phone] with questions or concerns regarding your invoice.  ° °Our billing staff will not be able to assist you with questions regarding bills from these companies. ° °You will be contacted with the lab results as soon as they are available. The fastest way to get your results is to activate your My Chart account. Instructions are located on the last page of this paperwork. If you have not heard from us regarding the results in 2 weeks, please contact this office. °  ° ° ° °

## 2019-01-31 NOTE — Progress Notes (Signed)
Virtual Visit Note  I connected with patient on 01/31/19 at 505pm by phone and verified that I am speaking with the correct person using two identifiers. Kelly Reed is currently located at home and patient is currently with them during visit. The provider, Rutherford Guys, MD is located in their office at time of visit.  I discussed the limitations, risks, security and privacy concerns of performing an evaluation and management service by telephone and the availability of in person appointments. I also discussed with the patient that there may be a patient responsible charge related to this service. The patient expressed understanding and agreed to proceed.   CC: routine followup  HPI ? Patient is a53 y.o.femalewith past medical history significant for asthma, HTN, DM2, seasonal allergies, HLP,anxiety who presents todayfor routine followup  Last OV May 2020  Has been trying to exercise, incorporate fruits and vegetable Checks cbgs: have been higher Has also gained weight Reports anxiety well controlled on effexor Very active work, loads cargo in warehouse  Lab Results  Component Value Date   HGBA1C 7.2 (H) 01/22/2019   HGBA1C 7.2 (H) 10/22/2018   HGBA1C 6.6 (A) 04/29/2018   Lab Results  Component Value Date   GLUF 178 (A) 03/20/2012   MICROALBUR 0.3 09/16/2015   LDLCALC 80 01/22/2019   CREATININE 0.60 01/22/2019   Lab Results  Component Value Date   ALT 42 (H) 01/22/2019   AST 31 01/22/2019   GGT 21 10/22/2018   ALKPHOS 73 01/22/2019   BILITOT 0.3 01/22/2019    Allergies  Allergen Reactions  . Adhesive [Tape] Rash and Other (See Comments)    Peeling of skin  . Prednisone     Hyperglycemia with oral steroids    Prior to Admission medications   Medication Sig Start Date End Date Taking? Authorizing Provider  ACCU-CHEK FASTCLIX LANCETS MISC U UTD TO CHECK BG TID 05/11/18  Yes [provider]  ACCU-CHEK GUIDE test strip U UTD TO CHECK BG TID  04/30/18  Yes [provider]  albuterol (VENTOLIN HFA) 108 (90 Base) MCG/ACT inhaler INHALE 2 PUFFS BY MOUTH INTO THE LUNGS EVERY 6 HOURS AS NEEDED WHEEZING OR SHORTNESS OF BREATH 12/31/17  Yes Shawnee Knapp, MD  aspirin EC 81 MG tablet Take 1 tablet (81 mg total) by mouth daily. 08/07/14  Yes Shawnee Knapp, MD  atorvastatin (LIPITOR) 20 MG tablet Take 1 tablet (20 mg total) by mouth daily at 6 PM. 11/25/18  Yes Rutherford Guys, MD  Blood Glucose Monitoring Suppl (ACCU-CHEK GUIDE ME) w/Device KIT U UTD TO CHECK BG TID 05/11/18  Yes [provider]  BREO ELLIPTA 200-25 MCG/INH AEPB INL 1 PUFF PO QD 11/24/17  Yes [provider]  Epinastine HCl 0.05 % ophthalmic solution Place 1 drop into both eyes 2 (two) times daily. 09/21/16  Yes Shawnee Knapp, MD  ferrous sulfate 325 (65 FE) MG tablet Take 325 mg by mouth every other day.    Yes [provider]  fluticasone (FLONASE) 50 MCG/ACT nasal spray Place into both nostrils daily.   Yes [provider]  levocetirizine (XYZAL) 5 MG tablet Take 1 tablet by mouth every evening.  03/08/17  Yes [provider]  losartan (COZAAR) 50 MG tablet TAKE 1 TABLET(25 MG) BY MOUTH DAILY 11/25/18  Yes Rutherford Guys, MD  MAGNESIUM PO Take by mouth.   Yes [provider]  metFORMIN (GLUCOPHAGE) 1000 MG tablet Take 1 tablet (1,000 mg total) by  mouth 2 (two) times daily with a meal. 11/25/18  Yes Rutherford Guys, MD  montelukast (SINGULAIR) 10 MG tablet Take 1 tablet (10 mg total) by mouth at bedtime. 04/29/18  Yes Rutherford Guys, MD  Multiple Vitamins-Minerals (CENTRUM ADULTS PO) Take by mouth.   Yes [provider]  sodium chloride (OCEAN) 0.65 % SOLN nasal spray Place 1 spray into both nostrils as needed for congestion.   Yes [provider]  venlafaxine XR (EFFEXOR-XR) 150 MG 24 hr capsule Take one capsule daily 01/01/19  Yes Rutherford Guys, MD  vitamin C (ASCORBIC ACID) 500 MG tablet Take 500 mg by  mouth daily.   Yes [provider]    Past Medical History:  Diagnosis Date  . Anemia   . Anxiety   . Asthma   . Diabetes mellitus type II   . Hyperlipidemia   . Hypertension   . NSVD (normal spontaneous vaginal delivery)    x 2    Past Surgical History:  Procedure Laterality Date  . 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 History of Abd. ultrasound  12/04   negative  . Surg. eval lap cholecstectomy  05/28/2006   deferred by patient  . TUBAL LIGATION  1993   bilateral    Social History   Tobacco Use  . Smoking status: Never Smoker  . Smokeless tobacco: Never Used  Substance Use Topics  . Alcohol use: Yes    Alcohol/week: 1.0 - 2.0 standard drinks    Types: 1 - 2 Standard drinks or equivalent per week    Comment: Occasional beer    Family History  Problem Relation Age of Onset  . Breast cancer Mother 81       pt's mother passed away from breast cancer when pt was 53 yo  . Diabetes Maternal Grandmother     Review of Systems  Constitutional: Negative for chills and fever.  Respiratory: Negative for cough and shortness of breath.   Cardiovascular: Negative for chest pain, palpitations and leg swelling.  Gastrointestinal: Negative for abdominal pain, nausea and vomiting.    Objective  Vitals as reported by the patient: none   ASSESSMENT and PLAN  1. Type 2 diabetes mellitus with hyperglycemia, without long-term current use of insulin ([place]) Above goal. Adding [place].   2. Hypercholesteremia Controlled. Continue current regime.   3. Anxiety and depression Controlled. Continue current regime.   Other orders   FOLLOW-UP: 3 months   The above assessment and management plan was discussed with the patient. The patient verbalized understanding of and has agreed to the management plan. Patient is aware to call the clinic if symptoms persist or worsen. Patient is aware when to return to the clinic for a follow-up visit. Patient educated  on when it is appropriate to go to the emergency department.    I provided 18 minutes of non-face-to-face time during this encounter.  Rutherford Guys, MD [facility] at [facility], [place] [address] Ph.  [phone] Fax [phone]

## 2019-01-31 NOTE — Progress Notes (Signed)
Effector refill request   Blood work results from a couple of weeks ago.

## 2019-02-18 ENCOUNTER — Telehealth: Payer: Self-pay | Admitting: Family Medicine

## 2019-02-18 NOTE — Telephone Encounter (Signed)
LVM to schedule 3 month f/u from 01/31/2019 appt per Dr. Pamella Pert

## 2019-02-18 NOTE — Telephone Encounter (Signed)
Pt called back. °

## 2019-02-18 NOTE — Progress Notes (Signed)
LVM to schedule appt

## 2019-02-18 NOTE — Telephone Encounter (Signed)
I have scheduled Ms. Kelly Reed for her 3 month f/u but pt was asking if she will need to do any lab work prior to her appt in October since she started on a new medication. Pt would like a call back with an answer.

## 2019-04-10 ENCOUNTER — Other Ambulatory Visit: Payer: Self-pay

## 2019-04-10 ENCOUNTER — Telehealth (INDEPENDENT_AMBULATORY_CARE_PROVIDER_SITE_OTHER): Payer: PRIVATE HEALTH INSURANCE | Admitting: Adult Health Nurse Practitioner

## 2019-04-10 DIAGNOSIS — J069 Acute upper respiratory infection, unspecified: Secondary | ICD-10-CM

## 2019-04-10 DIAGNOSIS — Z20822 Contact with and (suspected) exposure to covid-19: Secondary | ICD-10-CM

## 2019-04-10 MED ORDER — AZITHROMYCIN 250 MG PO TABS: ORAL_TABLET | ORAL | 0 refills | Status: DC

## 2019-04-10 NOTE — Patient Instructions (Signed)
Upper Respiratory Infection, Adult An upper respiratory infection (URI) is a common viral infection of the nose, throat, and upper air passages that lead to the lungs. The most common type of URI is the common cold. URIs usually get better on their own, without medical treatment. What are the causes? A URI is caused by a virus. You may catch a virus by:  Breathing in droplets from an infected person's cough or sneeze.  Touching something that has been exposed to the virus (contaminated) and then touching your mouth, nose, or eyes. What increases the risk? You are more likely to get a URI if:  You are very young or very old.  It is autumn or winter.  You have close contact with others, such as at a daycare, school, or health care facility.  You smoke.  You have long-term (chronic) heart or lung disease.  You have a weakened disease-fighting (immune) system.  You have nasal allergies or asthma.  You are experiencing a lot of stress.  You work in an area that has poor air circulation.  You have poor nutrition. What are the signs or symptoms? A URI usually involves some of the following symptoms:  Runny or stuffy (congested) nose.  Sneezing.  Cough.  Sore throat.  Headache.  Fatigue.  Fever.  Loss of appetite.  Pain in your forehead, behind your eyes, and over your cheekbones (sinus pain).  Muscle aches.  Redness or irritation of the eyes.  Pressure in the ears or face. How is this diagnosed? This condition may be diagnosed based on your medical history and symptoms, and a physical exam. Your health care provider may use a cotton swab to take a mucus sample from your nose (nasal swab). This sample can be tested to determine what virus is causing the illness. How is this treated? URIs usually get better on their own within 7-10 days. You can take steps at home to relieve your symptoms. Medicines cannot cure URIs, but your health care provider may recommend  certain medicines to help relieve symptoms, such as:  Over-the-counter cold medicines.  Cough suppressants. Coughing is a type of defense against infection that helps to clear the respiratory system, so take these medicines only as recommended by your health care provider.  Fever-reducing medicines. Follow these instructions at home: Activity  Rest as needed.  If you have a fever, stay home from work or school until your fever is gone or until your health care provider says you are no longer contagious. Your health care provider may have you wear a face mask to prevent your infection from spreading. Relieving symptoms  Gargle with a salt-water mixture 3-4 times a day or as needed. To make a salt-water mixture, completely dissolve -1 tsp of salt in 1 cup of warm water.  Use a cool-mist humidifier to add moisture to the air. This can help you breathe more easily. Eating and drinking   Drink enough fluid to keep your urine pale yellow.  Eat soups and other clear broths. General instructions   Take over-the-counter and prescription medicines only as told by your health care provider. These include cold medicines, fever reducers, and cough suppressants.  Do not use any products that contain nicotine or tobacco, such as cigarettes and e-cigarettes. If you need help quitting, ask your health care provider.  Stay away from secondhand smoke.  Stay up to date on all immunizations, including the yearly (annual) flu vaccine.  Keep all follow-up visits as told by your health   care provider. This is important. How to prevent the spread of infection to others   URIs can be passed from person to person (are contagious). To prevent the infection from spreading: ? Wash your hands often with soap and water. If soap and water are not available, use hand sanitizer. ? Avoid touching your mouth, face, eyes, or nose. ? Cough or sneeze into a tissue or your sleeve or elbow instead of into your hand  or into the air. Contact a health care provider if:  You are getting worse instead of better.  You have a fever or chills.  Your mucus is brown or red.  You have yellow or brown discharge coming from your nose.  You have pain in your face, especially when you bend forward.  You have swollen neck glands.  You have pain while swallowing.  You have white areas in the back of your throat. Get help right away if:  You have shortness of breath that gets worse.  You have severe or persistent: ? Headache. ? Ear pain. ? Sinus pain. ? Chest pain.  You have chronic lung disease along with any of the following: ? Wheezing. ? Prolonged cough. ? Coughing up blood. ? A change in your usual mucus.  You have a stiff neck.  You have changes in your: ? Vision. ? Hearing. ? Thinking. ? Mood. Summary  An upper respiratory infection (URI) is a common infection of the nose, throat, and upper air passages that lead to the lungs.  A URI is caused by a virus.  URIs usually get better on their own within 7-10 days.  Medicines cannot cure URIs, but your health care provider may recommend certain medicines to help relieve symptoms. This information is not intended to replace advice given to you by your health care provider. Make sure you discuss any questions you have with your health care provider. Document Released: 12/20/2000 Document Revised: 07/04/2018 Document Reviewed: 02/09/2017 Elsevier Patient Education  2020 [employer].

## 2019-04-10 NOTE — Progress Notes (Signed)
Telemedicine Encounter- SOAP NOTE Established Patient  This telephone encounter was conducted with the patient's (or proxy's) verbal consent via audio telecommunications: yes/no: Yes Patient was instructed to have this encounter in a suitably private space; and to only have persons present to whom they give permission to participate. In addition, patient identity was confirmed by use of name plus two identifiers (DOB and address).  I discussed the limitations, risks, security and privacy concerns of performing an evaluation and management service by telephone and the availability of in person appointments. I also discussed with the patient that there may be a patient responsible charge related to this service. The patient expressed understanding and agreed to proceed.  I spent a total of TIME; 0 MIN TO 60 MIN: 15 minutes talking with the patient or their proxy.  Chief Complaint  Patient presents with  . Cough    and congestion in chest, no sob, musinex and robitussin was taken  . Fever    since tuesday of 100.1    Subjective   Kelly Reed is a 53 y.o. established patient. Telephone visit today for upper respiratory infection   HPI  Patient reports a URI since Tuesday.  Complaints of cough, congestion, and fever up to 101.  Last known temp was this a.m. up to 100.  She has been taking a Mucinex cold and cough combination as well as used honey and fluids which has helped her get the congestion out.  Congestion is worse in chest than in sinuses.  She has felt achy and fatigued.  Has not had a known exposure to covid.  No recent Covid test.  She denies any loss of taste or smell.  Some SOB with coughing.   Patient Active Problem List   Diagnosis Date Noted  . Acute URI 06/14/2018  . Flu-like symptoms 06/14/2018  . Cough 08/21/2017  . Lower respiratory infection 08/21/2017  . Sinus congestion 08/21/2017  . History of asthma 08/21/2017  . Asthma 09/20/2016  . Chronic seasonal  allergic rhinitis 09/20/2016  . Anemia, iron deficiency 08/07/2014  . Allergic reaction to chemical substance 08/07/2014  . Muscle cramp, nocturnal 08/07/2014  . Essential hypertension, benign 12/07/2012  . Polypharmacy 12/07/2012  . Ganglion cyst 10/22/2012  . Hypercholesteremia 05/04/2011  . Anxiety and depression 02/01/2011  . EDEMA 03/04/2009  . Allergic state 11/16/2006  . Diabetes mellitus, type II ([place]) 10/25/2006    Past Medical History:  Diagnosis Date  . Anemia   . Anxiety   . Asthma   . Diabetes mellitus type II   . Hyperlipidemia   . Hypertension   . NSVD (normal spontaneous vaginal delivery)    x 2    Current Outpatient Medications  Medication Sig Dispense Refill  . ACCU-CHEK FASTCLIX LANCETS MISC U UTD TO CHECK BG TID  11  . ACCU-CHEK GUIDE test strip U UTD TO CHECK BG TID  11  . albuterol (VENTOLIN HFA) 108 (90 Base) MCG/ACT inhaler INHALE 2 PUFFS BY MOUTH INTO THE LUNGS EVERY 6 HOURS AS NEEDED WHEEZING OR SHORTNESS OF BREATH 18 g 5  . aspirin EC 81 MG tablet Take 1 tablet (81 mg total) by mouth daily.    Marland Kitchen atorvastatin (LIPITOR) 20 MG tablet Take 1 tablet (20 mg total) by mouth daily at 6 PM. 90 tablet 1  . Blood Glucose Monitoring Suppl (ACCU-CHEK GUIDE ME) w/Device KIT U UTD TO CHECK BG TID  0  . BREO ELLIPTA 200-25 MCG/INH AEPB INL 1 PUFF PO  QD  2  . Epinastine HCl 0.05 % ophthalmic solution Place 1 drop into both eyes 2 (two) times daily. 5 mL 2  . ferrous sulfate 325 (65 FE) MG tablet Take 325 mg by mouth every other day.     . fluticasone (FLONASE) 50 MCG/ACT nasal spray Place into both nostrils daily.    Marland Kitchen levocetirizine (XYZAL) 5 MG tablet Take 1 tablet by mouth every evening.   9  . losartan (COZAAR) 50 MG tablet TAKE 1 TABLET(25 MG) BY MOUTH DAILY 90 tablet 1  . MAGNESIUM PO Take by mouth.    . metFORMIN (GLUCOPHAGE) 1000 MG tablet Take 1 tablet (1,000 mg total) by mouth 2 (two) times daily with a meal. 180 tablet 0  . montelukast (SINGULAIR) 10 MG  tablet Take 1 tablet (10 mg total) by mouth at bedtime. 90 tablet 3  . Multiple Vitamins-Minerals (CENTRUM ADULTS PO) Take by mouth.    . sitaGLIPtin (JANUVIA) 100 MG tablet Take 1 tablet (100 mg total) by mouth daily. 30 tablet 3  . sodium chloride (OCEAN) 0.65 % SOLN nasal spray Place 1 spray into both nostrils as needed for congestion.    Marland Kitchen venlafaxine XR (EFFEXOR-XR) 150 MG 24 hr capsule Take 1 capsule (150 mg total) by mouth daily with breakfast. Take one capsule daily 30 capsule 5  . vitamin C (ASCORBIC ACID) 500 MG tablet Take 500 mg by mouth daily.    Marland Kitchen azithromycin (ZITHROMAX) 250 MG tablet As directed on pkg label 6 tablet 0   No current facility-administered medications for this visit.     Allergies  Allergen Reactions  . Adhesive [Tape] Rash and Other (See Comments)    Peeling of skin  . Prednisone     Hyperglycemia with oral steroids    Social History   Socioeconomic History  . Marital status: Married    Spouse name: Not on file  . Number of children: 2  . Years of education: Not on file  . Highest education level: Not on file  Occupational History  . Occupation: Retail buyer.    Employer: [employer]  Social Needs  . Financial resource strain: Not on file  . Food insecurity    Worry: Not on file    Inability: Not on file  . Transportation needs    Medical: Not on file    Non-medical: Not on file  Tobacco Use  . Smoking status: Never Smoker  . Smokeless tobacco: Never Used  Substance and Sexual Activity  . Alcohol use: Yes    Alcohol/week: 1.0 - 2.0 standard drinks    Types: 1 - 2 Standard drinks or equivalent per week    Comment: Occasional beer  . Drug use: No  . Sexual activity: Never    Partners: Male    Birth control/protection: Surgical  Lifestyle  . Physical activity    Days per week: Not on file    Minutes per session: Not on file  . Stress: Not on file  Relationships  . Social Herbalist on phone: Not on file    Gets  together: Not on file    Attends religious service: Not on file    Active member of club or organization: Not on file    Attends meetings of clubs or organizations: Not on file    Relationship status: Not on file  . Intimate partner violence    Fear of current or ex partner: Not on file    Emotionally abused:  Not on file    Physically abused: Not on file    Forced sexual activity: Not on file  Other Topics Concern  . Not on file  Social History Narrative   2 Sons   Works at [employer]   Married    Review of Systems  Constitutional: Positive for chills, diaphoresis, fever and malaise/fatigue.  HENT: Positive for congestion and sore throat.   Eyes: Negative.   Respiratory: Positive for cough, sputum production and shortness of breath. Negative for wheezing.   Cardiovascular: Negative.   Skin: Negative.   Endo/Heme/Allergies: Negative.     Objective     GEN: WDWN, NAD, Alert & Oriented x 3 HEENT:  Sounds congested and hoarse over the phone.  PSYCH: Normally interactive. Conversant. Not depressed or anxious appearing.  Calm demeanor.    Vitals as reported by the patient: There were no vitals filed for this visit.  Shevelle was seen today for cough and fever.  Diagnoses and all orders for this visit:  Acute upper respiratory infection -     Novel Coronavirus, NAA ([facility])  Other orders -     azithromycin (ZITHROMAX) 250 MG tablet; As directed on pkg label  Encouraged patient to continue treating with conservative measures as well:  Honey, otc symptomatic relief.  Given her co-morbidities, hx of fever, and degree of congestion, it would not be unreasonable to prescribe an antibiotic.  I have sent one into her pharmacy which she will take after she gets a Covid test.  Instructions and address given to the [facility] location in [place].  Patient verbalized understanding and she is satisfied with plan.   I discussed the assessment and treatment plan with the  patient. The patient was provided an opportunity to ask questions and all were answered. The patient agreed with the plan and demonstrated an understanding of the instructions.   The patient was advised to call back or seek an in-person evaluation if the symptoms worsen or if the condition fails to improve as anticipated.  I provided 15 minutes of non-face-to-face time during this encounter.  Glyn Ade, NP  Primary Care at [facility]

## 2019-04-11 ENCOUNTER — Telehealth: Payer: Self-pay | Admitting: Adult Health Nurse Practitioner

## 2019-04-11 ENCOUNTER — Encounter: Payer: Self-pay | Admitting: Adult Health Nurse Practitioner

## 2019-04-11 ENCOUNTER — Encounter: Payer: Self-pay | Admitting: Family Medicine

## 2019-04-11 DIAGNOSIS — U071 COVID-19: Secondary | ICD-10-CM

## 2019-04-11 HISTORY — DX: COVID-19: U07.1

## 2019-04-11 LAB — NOVEL CORONAVIRUS, NAA: SARS-CoV-2, NAA: DETECTED — AB

## 2019-04-11 NOTE — Telephone Encounter (Signed)
Spoke with the patient and informed of Covid positive result.  Her husband was recently sick and feels better and 2 sons live with them.  Advised that everyone in the house needs to quarantine.  She verbalized understanding.  All questions were answered.    Person Under Monitoring Name: Kelly Reed  Location: [place] [address]   Infection Prevention Recommendations for Individuals Confirmed to have, or Being Evaluated for, 2019 Novel Coronavirus (COVID-19) Infection Who Receive Care at Home  Individuals who are confirmed to have, or are being evaluated for, COVID-19 should follow the prevention steps below until a healthcare provider or local or state health department says they can return to normal activities.  Stay home except to get medical care You should restrict activities outside your home, except for getting medical care. Do not go to work, school, or public areas, and do not use public transportation or taxis.  Call ahead before visiting your doctor Before your medical appointment, call the healthcare provider and tell them that you have, or are being evaluated for, COVID-19 infection. This will help the healthcare provider's office take steps to keep other people from getting infected. Ask your healthcare provider to call the local or state health department.  Monitor your symptoms Seek prompt medical attention if your illness is worsening (e.g., difficulty breathing). Before going to your medical appointment, call the healthcare provider and tell them that you have, or are being evaluated for, COVID-19 infection. Ask your healthcare provider to call the local or state health department.  Wear a facemask You should wear a facemask that covers your nose and mouth when you are in the same room with other people and when you visit a healthcare provider. People who live with or visit you should also wear a facemask while they are in the same room  with you.  Separate yourself from other people in your home As much as possible, you should stay in a different room from other people in your home. Also, you should use a separate bathroom, if available.  Avoid sharing household items You should not share dishes, drinking glasses, cups, eating utensils, towels, bedding, or other items with other people in your home. After using these items, you should wash them thoroughly with soap and water.  Cover your coughs and sneezes Cover your mouth and nose with a tissue when you cough or sneeze, or you can cough or sneeze into your sleeve. Throw used tissues in a lined trash can, and immediately wash your hands with soap and water for at least 20 seconds or use an alcohol-based hand rub.  Wash your [facility] your hands often and thoroughly with soap and water for at least 20 seconds. You can use an alcohol-based hand sanitizer if soap and water are not available and if your hands are not visibly dirty. Avoid touching your eyes, nose, and mouth with unwashed hands.   Prevention Steps for Caregivers and Household Members of Individuals Confirmed to have, or Being Evaluated for, COVID-19 Infection Being Cared for in the Home  If you live with, or provide care at home for, a person confirmed to have, or being evaluated for, COVID-19 infection please follow these guidelines to prevent infection:  Follow healthcare provider's instructions Make sure that you understand and can help the patient follow any healthcare provider instructions for all care.  Provide for the patient's basic needs You should help the patient with basic needs in the home and provide  support for getting groceries, prescriptions, and other personal needs.  Monitor the patient's symptoms If they are getting sicker, call his or her medical provider and tell them that the patient has, or is being evaluated for, COVID-19 infection. This will help the healthcare provider's  office take steps to keep other people from getting infected. Ask the healthcare provider to call the local or state health department.  Limit the number of people who have contact with the patient  If possible, have only one caregiver for the patient.  Other household members should stay in another home or place of residence. If this is not possible, they should stay  in another room, or be separated from the patient as much as possible. Use a separate bathroom, if available.  Restrict visitors who do not have an essential need to be in the home.  Keep older adults, very young children, and other sick people away from the patient Keep older adults, very young children, and those who have compromised immune systems or chronic health conditions away from the patient. This includes people with chronic heart, lung, or kidney conditions, diabetes, and cancer.  Ensure good ventilation Make sure that shared spaces in the home have good air flow, such as from an air conditioner or an opened window, weather permitting.  Wash your hands often  Wash your hands often and thoroughly with soap and water for at least 20 seconds. You can use an alcohol based hand sanitizer if soap and water are not available and if your hands are not visibly dirty.  Avoid touching your eyes, nose, and mouth with unwashed hands.  Use disposable paper towels to dry your hands. If not available, use dedicated cloth towels and replace them when they become wet.  Wear a facemask and gloves  Wear a disposable facemask at all times in the room and gloves when you touch or have contact with the patient's blood, body fluids, and/or secretions or excretions, such as sweat, saliva, sputum, nasal mucus, vomit, urine, or feces.  Ensure the mask fits over your nose and mouth tightly, and do not touch it during use.  Throw out disposable facemasks and gloves after using them. Do not reuse.  Wash your hands immediately after  removing your facemask and gloves.  If your personal clothing becomes contaminated, carefully remove clothing and launder. Wash your hands after handling contaminated clothing.  Place all used disposable facemasks, gloves, and other waste in a lined container before disposing them with other household waste.  Remove gloves and wash your hands immediately after handling these items.  Do not share dishes, glasses, or other household items with the patient  Avoid sharing household items. You should not share dishes, drinking glasses, cups, eating utensils, towels, bedding, or other items with a patient who is confirmed to have, or being evaluated for, COVID-19 infection.  After the person uses these items, you should wash them thoroughly with soap and water.  Wash laundry thoroughly  Immediately remove and wash clothes or bedding that have blood, body fluids, and/or secretions or excretions, such as sweat, saliva, sputum, nasal mucus, vomit, urine, or feces, on them.  Wear gloves when handling laundry from the patient.  Read and follow directions on labels of laundry or clothing items and detergent. In general, wash and dry with the warmest temperatures recommended on the label.  Clean all areas the individual has used often  Clean all touchable surfaces, such as counters, tabletops, doorknobs, bathroom fixtures, toilets, phones, keyboards, tablets,  and bedside tables, every day. Also, clean any surfaces that may have blood, body fluids, and/or secretions or excretions on them.  Wear gloves when cleaning surfaces the patient has come in contact with.  Use a diluted bleach solution (e.g., dilute bleach with 1 part bleach and 10 parts water) or a household disinfectant with a label that says EPA-registered for coronaviruses. To make a bleach solution at home, add 1 tablespoon of bleach to 1 quart (4 cups) of water. For a larger supply, add  cup of bleach to 1 gallon (16 cups) of water.   Read labels of cleaning products and follow recommendations provided on product labels. Labels contain instructions for safe and effective use of the cleaning product including precautions you should take when applying the product, such as wearing gloves or eye protection and making sure you have good ventilation during use of the product.  Remove gloves and wash hands immediately after cleaning.  Monitor yourself for signs and symptoms of illness Caregivers and household members are considered close contacts, should monitor their health, and will be asked to limit movement outside of the home to the extent possible. Follow the monitoring steps for close contacts listed on the symptom monitoring form.   ? If you have additional questions, contact your local health department or call the epidemiologist on call at [phone] (available 24/7). ? This guidance is subject to change. For the most up-to-date guidance from [facility], please refer to their website: [email or web address]

## 2019-04-14 ENCOUNTER — Telehealth: Payer: Self-pay | Admitting: *Deleted

## 2019-04-14 NOTE — Telephone Encounter (Signed)
The [facility] Dept called requesting what day her symptoms started.    They have notified her of her test result.   I was unable to locate anything in the chart indicating when her symptoms for COVID-19 started.

## 2019-04-15 ENCOUNTER — Encounter: Payer: Self-pay | Admitting: Registered Nurse

## 2019-04-15 ENCOUNTER — Other Ambulatory Visit: Payer: Self-pay | Admitting: Registered Nurse

## 2019-04-15 ENCOUNTER — Telehealth: Payer: Self-pay | Admitting: Registered Nurse

## 2019-04-15 DIAGNOSIS — Z7189 Other specified counseling: Secondary | ICD-10-CM

## 2019-04-15 MED ORDER — SALINE SPRAY 0.65 % NA SOLN: 1.0000 | NASAL | Status: DC

## 2019-04-15 MED ORDER — FLUTICASONE PROPIONATE 50 MCG/ACT NA SUSP: 1.0000 | Freq: Two times a day (BID) | NASAL | 6 refills | Status: DC

## 2019-04-15 NOTE — Telephone Encounter (Signed)
Notified patient with positive covid 19 test and sent home from work last week for testing and home quarantine.  Spouse work had outbreak.  Patient today reported she is coughing up yellow mucous, finished last dose of azithromycin this am.  Tessalon pearles don't help with cough.  Using albuterol inhaler prn.  Her breo was changed due to no longer had coupon to help pay so taking Wixela 250/59mcg 1 puff po BID now.  She thinks it is working as well as the [employer] since she stated it.  "I am not wheezing".  Denied blue lips or face.  Spoke full sentences without difficulty.  Clearing throat frequently.  Cough heard 1-2 times during 15 minute phone call.  Patient denied post nasal drip or rhinitis.  Has a headache, nasal congestion, sinus pressure and fatigue. Using nasal saline four times a day, flonase 2 sprays daily and mucinex, her allergy medications xyzal/singulair and humidfier at home. Denied fever/chills, GI upset, sore throat or muscle aches.  Reiterated with patient to split flonase dose to 1 spray each nostril BID, increase nasal saline use to 2 sprays each nostril q1-2 hours while awake, push po fluids to keep urine pale clear yellow, eat regular meals to keep stable blood sugar, consider a little caffeine to see if it helps with headache since tylenol not helping.  Discussed sinus inflammation/congestion, dehydration, low/high blood sugar, low oxygen level and viral infection could all be causing headache.  Discussed ER if syncope, confusion, blue lips/face, worsening dypsnea despite plan of care.  Discussed azithromycin in her systems for 96 hours after last dose.  Follow up with PCM prn concerns/new symptoms or worsening.  Will call her to check again in 48 hours when NP in clinic again.  Discussed phone sp02 monitors not that reliable when readings less than 95% per published studies.  Discussed it could be helpful to buy from [facility]/[employer] home spo2 monitor to help her monitor  her home condition typical cost 20-30 dollars.  Discussed continue eating regular health meals with lean protein/fruits/vegetables and not excessive dairy as worsens mucous production.  Avoid dehydration.  Patient verbalized understanding information/instructions, agreed with plan of care and had no further questions at this time.

## 2019-04-17 ENCOUNTER — Telehealth: Payer: Self-pay | Admitting: Registered Nurse

## 2019-04-17 ENCOUNTER — Encounter: Payer: Self-pay | Admitting: Registered Nurse

## 2019-04-17 DIAGNOSIS — Z7189 Other specified counseling: Secondary | ICD-10-CM

## 2019-04-17 NOTE — Telephone Encounter (Signed)
Patient reported she is feeling somewhat better congestion decreased but still fatigue and loss of taste.  Son checking on her and ensuring she is still eating.  Sat on porch today enjoyed the nice weather but having some coughing now because she hasn't had allergy shot or taking her allergy medications yet today.  Voice sounds stronger on phone, no throat clearing.  Doesn't feel like she needs oral steroids for cough.  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  Discussed with patient to notify her supervisor if still fatigue/no energy as may need to extend her sick leave.  Discussed not to overdue her activity and allow her body to heal with maintaining hydration, rest and quality healthy meals.  Patient verbalized understanding information/instructions, agreed with plan of care and had no further questions at this time.

## 2019-04-21 ENCOUNTER — Other Ambulatory Visit: Payer: Self-pay | Admitting: Family Medicine

## 2019-04-21 DIAGNOSIS — J302 Other seasonal allergic rhinitis: Secondary | ICD-10-CM

## 2019-04-22 ENCOUNTER — Encounter: Payer: Self-pay | Admitting: Registered Nurse

## 2019-04-22 ENCOUNTER — Telehealth: Payer: Self-pay | Admitting: Registered Nurse

## 2019-04-22 NOTE — Telephone Encounter (Signed)
Patient still out of work today per peer.  Attempted to contact patient no answer x 15 rings.  No voicemail.  Patient with fatigue s/p covid 19 infection last week.  Please attempt to contact patient this afternoon.

## 2019-04-24 NOTE — Telephone Encounter (Signed)
Noted will follow up with patient next week.

## 2019-04-24 NOTE — Telephone Encounter (Signed)
Spoke with pt over phone today. She reports no missed calls on Tuesday. Cell service is spotty at her home and she likely never received call.  Pt reports she is feeling much better. Fatigue is almost fully resolved. She is back at her normal activity level around her home. Has regained majority of sense of smell, still no taste. Denies cough or congestion. Sts she hasn't needed cough medicine in 3-4 days. No cough or throat clearing noted while on phone. Sts the health department has been contacting her routinely. She expects a call today or tomorrow and per previous conversations with their staff, she expects to be released by them to return to work on Monday 04/28/19. Pt to call clinic with any further needs or concerns. She has no additional questions.

## 2019-04-29 ENCOUNTER — Telehealth (INDEPENDENT_AMBULATORY_CARE_PROVIDER_SITE_OTHER): Payer: PRIVATE HEALTH INSURANCE | Admitting: Adult Health Nurse Practitioner

## 2019-04-29 ENCOUNTER — Other Ambulatory Visit: Payer: Self-pay

## 2019-04-29 ENCOUNTER — Encounter: Payer: Self-pay | Admitting: Adult Health Nurse Practitioner

## 2019-04-29 ENCOUNTER — Telehealth: Payer: Self-pay | Admitting: *Deleted

## 2019-04-29 DIAGNOSIS — U071 COVID-19: Secondary | ICD-10-CM

## 2019-04-29 DIAGNOSIS — E119 Type 2 diabetes mellitus without complications: Secondary | ICD-10-CM

## 2019-04-29 HISTORY — DX: COVID-19: U07.1

## 2019-04-29 MED ORDER — ALBUTEROL SULFATE HFA 108 (90 BASE) MCG/ACT IN AERS: INHALATION_SPRAY | RESPIRATORY_TRACT | 5 refills | Status: DC

## 2019-04-29 NOTE — Progress Notes (Signed)
Telemedicine Encounter- SOAP NOTE Established Patient  This telephone encounter was conducted with the patient's (or proxy's) verbal consent via audio telecommunications: yes/no: Yes Patient was instructed to have this encounter in a suitably private space; and to only have persons present to whom they give permission to participate. In addition, patient identity was confirmed by use of name plus two identifiers (DOB and address).  I discussed the limitations, risks, security and privacy concerns of performing an evaluation and management service by telephone and the availability of in person appointments. I also discussed with the patient that there may be a patient responsible charge related to this service. The patient expressed understanding and agreed to proceed.  I spent a total of TIME; 0 MIN TO 60 MIN: 20 minutes talking with the patient or their proxy.  Chief Complaint  Patient presents with  . Follow-up    pt stated went to back to work yesterday--still weak, tired.    Subjective   Kelly Reed is a 53 y.o. established patient. Telephone visit today for post Covid-sequelae  Ms. Kelly Reed just returned to work yesterday. Since Covid, she has recovered from her URI.  However, she does have asthma and is using her Albuterol inhaler daily and prn.  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  She denies chest pain, dizziness,   She is requesting a limited schedule daily in order to build up her strength again for work.  She is quite active at work and she has multiple comorbidities that are likely contributing to post Covid fatigue.   Blood sugars have been stable over the past 10 days.   Patient Active Problem List   Diagnosis Date Noted  . COVID-19 with comorbid diabetes mellitus ([place]) 04/29/2019  . COVID-19 virus detected 04/11/2019  . Acute URI  06/14/2018  . Flu-like symptoms 06/14/2018  . Cough 08/21/2017  . Lower respiratory infection 08/21/2017  . Sinus congestion 08/21/2017  . History of asthma 08/21/2017  . Asthma 09/20/2016  . Chronic seasonal allergic rhinitis 09/20/2016  . Anemia, iron deficiency 08/07/2014  . Allergic reaction to chemical substance 08/07/2014  . Muscle cramp, nocturnal 08/07/2014  . Essential hypertension, benign 12/07/2012  . Polypharmacy 12/07/2012  . Ganglion cyst 10/22/2012  . Hypercholesteremia 05/04/2011  . Anxiety and depression 02/01/2011  . EDEMA 03/04/2009  . Allergic state 11/16/2006  . Diabetes mellitus, type II ([place]) 10/25/2006    Past Medical History:  Diagnosis Date  . Anemia   . Anxiety   . Asthma   . COVID-19 04/29/2019  . COVID-19 virus detected 04/11/2019  . Diabetes mellitus type II   . Hyperlipidemia   . Hypertension   . NSVD (normal spontaneous vaginal delivery)    x 2    Current Outpatient Medications  Medication Sig Dispense Refill  . ACCU-CHEK FASTCLIX LANCETS MISC U UTD TO CHECK BG TID  11  . ACCU-CHEK GUIDE test strip U UTD TO CHECK BG TID  11  . albuterol (VENTOLIN HFA) 108 (90 Base) MCG/ACT inhaler INHALE 2 PUFFS BY MOUTH INTO THE LUNGS EVERY 6 HOURS AS NEEDED WHEEZING OR SHORTNESS OF BREATH 18 g 5  . aspirin EC 81 MG tablet Take 1 tablet (81 mg total) by mouth daily.    Marland Kitchen atorvastatin (LIPITOR) 20 MG tablet Take 1 tablet (20 mg total) by mouth daily at  6 PM. 90 tablet 1  . Blood Glucose Monitoring Suppl (ACCU-CHEK GUIDE ME) w/Device KIT U UTD TO CHECK BG TID  0  . Epinastine HCl 0.05 % ophthalmic solution Place 1 drop into both eyes 2 (two) times daily. 5 mL 2  . ferrous sulfate 325 (65 FE) MG tablet Take 325 mg by mouth every other day.     . fluticasone (FLONASE) 50 MCG/ACT nasal spray Place 1 spray into both nostrils 2 (two) times daily. 16 g 6  . levocetirizine (XYZAL) 5 MG tablet Take 1 tablet by mouth every evening.   9  . losartan (COZAAR) 50 MG  tablet TAKE 1 TABLET(25 MG) BY MOUTH DAILY 90 tablet 1  . MAGNESIUM PO Take by mouth.    . metFORMIN (GLUCOPHAGE) 1000 MG tablet Take 1 tablet (1,000 mg total) by mouth 2 (two) times daily with a meal. 180 tablet 0  . montelukast (SINGULAIR) 10 MG tablet TAKE 1 TABLET(10 MG) BY MOUTH AT BEDTIME 90 tablet 3  . Multiple Vitamins-Minerals (CENTRUM ADULTS PO) Take by mouth.    . sitaGLIPtin (JANUVIA) 100 MG tablet Take 1 tablet (100 mg total) by mouth daily. 30 tablet 3  . sodium chloride (OCEAN) 0.65 % SOLN nasal spray Place 1 spray into both nostrils every 2 (two) hours while awake.    . venlafaxine XR (EFFEXOR-XR) 150 MG 24 hr capsule Take 1 capsule (150 mg total) by mouth daily with breakfast. Take one capsule daily 30 capsule 5  . vitamin C (ASCORBIC ACID) 500 MG tablet Take 500 mg by mouth daily.    Grant Ruts INHUB 250-50 MCG/DOSE AEPB Inhale 1 puff into the lungs 2 (two) times daily.    . Zinc 30 MG TABS Take by mouth.     No current facility-administered medications for this visit.     Allergies  Allergen Reactions  . Adhesive [Tape] Rash and Other (See Comments)    Peeling of skin  . Prednisone     Hyperglycemia with oral steroids    Social History   Socioeconomic History  . Marital status: Married    Spouse name: Not on file  . Number of children: 2  . Years of education: Not on file  . Highest education level: Not on file  Occupational History  . Occupation: Retail buyer.    Employer: [employer]  Social Needs  . Financial resource strain: Not on file  . Food insecurity    Worry: Not on file    Inability: Not on file  . Transportation needs    Medical: Not on file    Non-medical: Not on file  Tobacco Use  . Smoking status: Never Smoker  . Smokeless tobacco: Never Used  Substance and Sexual Activity  . Alcohol use: Yes    Alcohol/week: 1.0 - 2.0 standard drinks    Types: 1 - 2 Standard drinks or equivalent per week    Comment: Occasional beer  . Drug use:  No  . Sexual activity: Never    Partners: Male    Birth control/protection: Surgical  Lifestyle  . Physical activity    Days per week: Not on file    Minutes per session: Not on file  . Stress: Not on file  Relationships  . Social Herbalist on phone: Not on file    Gets together: Not on file    Attends religious service: Not on file    Active member of club or organization: Not on file  Attends meetings of clubs or organizations: Not on file    Relationship status: Not on file  . Intimate partner violence    Fear of current or ex partner: Not on file    Emotionally abused: Not on file    Physically abused: Not on file    Forced sexual activity: Not on file  Other Topics Concern  . Not on file  Social History Narrative   2 Sons   Works at [employer]   Married    Review of Systems  Constitutional: Positive for malaise/fatigue. Negative for chills and fever.  HENT:       + moderate loss of taste and smell   Eyes: Negative.   Respiratory: Positive for cough and shortness of breath.   Cardiovascular: Negative.   Gastrointestinal: Negative for diarrhea and vomiting.  Musculoskeletal: Positive for myalgias.  Skin: Negative.   Neurological: Positive for weakness. Negative for dizziness, focal weakness and headaches.  Endo/Heme/Allergies: Negative for polydipsia.  Psychiatric/Behavioral: Negative.     Objective   Vitals as reported by the patient: There were no vitals filed for this visit.  Carnetta was seen today for follow-up.  Diagnoses and all orders for this visit:  [id number] with comorbid diabetes mellitus ([place])  Other orders -     albuterol (VENTOLIN HFA) 108 (90 Base) MCG/ACT inhaler; INHALE 2 PUFFS BY MOUTH INTO THE LUNGS EVERY 6 HOURS AS NEEDED WHEEZING OR SHORTNESS OF BREATH   Will write for her to have an adjusted work schedule until next Friday then reevaluate.  Discussed the possible course of Covid related symptoms.  Advised walking  10-15 at least a day to open up lungs and continue inhalers.  Increase fluids.  Patient is aware.   I discussed the assessment and treatment plan with the patient. The patient was provided an opportunity to ask questions and all were answered. The patient agreed with the plan and demonstrated an understanding of the instructions.   The patient was advised to call back or seek an in-person evaluation if the symptoms worsen or if the condition fails to improve as anticipated.  I provided 20 minutes of non-face-to-face time during this encounter.  Glyn Ade, NP  Primary Care at [facility]

## 2019-04-29 NOTE — Telephone Encounter (Signed)
notified pt--sent/faxed the letter to her work place ([employer]) Fax:#[phone].

## 2019-05-02 ENCOUNTER — Other Ambulatory Visit: Payer: Self-pay

## 2019-05-02 ENCOUNTER — Encounter: Payer: Self-pay | Admitting: Family Medicine

## 2019-05-02 ENCOUNTER — Ambulatory Visit: Payer: PRIVATE HEALTH INSURANCE | Admitting: Family Medicine

## 2019-05-02 VITALS — BP 135/82 | HR 105 | Temp 98.2°F | Ht 71.0 in | Wt 218.0 lb

## 2019-05-02 DIAGNOSIS — E1165 Type 2 diabetes mellitus with hyperglycemia: Secondary | ICD-10-CM | POA: Diagnosis not present

## 2019-05-02 DIAGNOSIS — I10 Essential (primary) hypertension: Secondary | ICD-10-CM

## 2019-05-02 DIAGNOSIS — E7800 Pure hypercholesterolemia, unspecified: Secondary | ICD-10-CM

## 2019-05-02 LAB — POCT GLYCOSYLATED HEMOGLOBIN (HGB A1C): Hemoglobin A1C: 6.9 % — AB (ref 4.0–5.6)

## 2019-05-02 NOTE — Patient Instructions (Signed)
° ° ° °  If you have lab work done today you will be contacted with your lab results within the next 2 weeks.  If you have not heard from us then please contact us. The fastest way to get your results is to register for My Chart. ° ° °IF you received an x-ray today, you will receive an invoice from [facility]. Please contact [facility] at [phone] with questions or concerns regarding your invoice.  ° °IF you received labwork today, you will receive an invoice from [facility]. Please contact [facility] at [phone] with questions or concerns regarding your invoice.  ° °Our billing staff will not be able to assist you with questions regarding bills from these companies. ° °You will be contacted with the lab results as soon as they are available. The fastest way to get your results is to activate your My Chart account. Instructions are located on the last page of this paperwork. If you have not heard from us regarding the results in 2 weeks, please contact this office. °  ° ° ° °

## 2019-05-02 NOTE — Progress Notes (Signed)
10/23/20204:48 PM  Kelly Reed 1965-11-22, 53 y.o., female [id number]  Chief Complaint  Patient presents with  . Diabetes    HPI:   Patient is a 53 y.o. female with past medical history significant for asthma, HTN, DM2, seasonal allergies, HLP,anxiety who presents todayfor routine followup  Last OV July 2020 - telemedicine Added januvia - tolerating well Was diagnosed with covid 3 weeks ago Recovering slowly, still very tired, currently working 4 hours - restrictions Has had to use albuterol twice this week Had her allergy injection yesterday wo issues Sees asthma doctor in nov Anxiety overall ok, a bit touchy this week as she has returned to work after [employer] Value Date   HGBA1C 6.9 (A) 05/02/2019   HGBA1C 7.2 (H) 01/22/2019   HGBA1C 7.2 (H) 10/22/2018   Lab Results  Component Value Date   GLUF 178 (A) 03/20/2012   MICROALBUR 0.3 09/16/2015   LDLCALC 80 01/22/2019   CREATININE 0.60 01/22/2019    Depression screen PHQ 2/9 05/02/2019 04/29/2019 06/14/2018  Decreased Interest 0 0 0  Down, Depressed, Hopeless 0 0 0  PHQ - 2 Score 0 0 0    Fall Risk  05/02/2019 04/29/2019 06/14/2018 01/05/2018 01/02/2018  Falls in the past year? 0 0 0 No No  Number falls in past yr: 0 0 - - -  Injury with Fall? 0 0 - - -  Comment - - - - -     Allergies  Allergen Reactions  . Adhesive [Tape] Rash and Other (See Comments)    Peeling of skin  . Prednisone     Hyperglycemia with oral steroids    Prior to Admission medications   Medication Sig Start Date End Date Taking? Authorizing Provider  ACCU-CHEK FASTCLIX LANCETS MISC U UTD TO CHECK BG TID 05/11/18  Yes [provider]  ACCU-CHEK GUIDE test strip U UTD TO CHECK BG TID 04/30/18  Yes [provider]  albuterol (VENTOLIN HFA) 108 (90 Base) MCG/ACT inhaler INHALE 2 PUFFS BY MOUTH INTO THE LUNGS EVERY 6 HOURS AS NEEDED WHEEZING OR SHORTNESS OF BREATH 04/29/19  Yes Wendall Mola, NP  aspirin EC 81 MG tablet Take 1 tablet (81 mg total) by mouth daily. 08/07/14  Yes Shawnee Knapp, MD  atorvastatin (LIPITOR) 20 MG tablet Take 1 tablet (20 mg total) by mouth daily at 6 PM. 11/25/18  Yes Rutherford Guys, MD  Blood Glucose Monitoring Suppl (ACCU-CHEK GUIDE ME) w/Device KIT U UTD TO CHECK BG TID 05/11/18  Yes [provider]  Epinastine HCl 0.05 % ophthalmic solution Place 1 drop into both eyes 2 (two) times daily. 09/21/16  Yes Shawnee Knapp, MD  ferrous sulfate 325 (65 FE) MG tablet Take 325 mg by mouth every other day.    Yes [provider]  fluticasone (FLONASE) 50 MCG/ACT nasal spray Place 1 spray into both nostrils 2 (two) times daily. 04/15/19 05/15/19 Yes Betancourt, Aura Fey, NP  levocetirizine (XYZAL) 5 MG tablet Take 1 tablet by mouth every evening.  03/08/17  Yes [provider]  losartan (COZAAR) 50 MG tablet TAKE 1 TABLET(25 MG) BY MOUTH DAILY 11/25/18  Yes Rutherford Guys, MD  MAGNESIUM PO Take by mouth.   Yes [provider]  metFORMIN (GLUCOPHAGE) 1000 MG tablet Take 1 tablet (1,000 mg total) by mouth 2 (two) times daily with a meal. 11/25/18  Yes Rutherford Guys, MD  montelukast (SINGULAIR) 10 MG tablet  TAKE 1 TABLET(10 MG) BY MOUTH AT BEDTIME 04/21/19  Yes Rutherford Guys, MD  Multiple Vitamins-Minerals (CENTRUM ADULTS PO) Take by mouth.   Yes [provider]  sitaGLIPtin (JANUVIA) 100 MG tablet Take 1 tablet (100 mg total) by mouth daily. 01/31/19  Yes Rutherford Guys, MD  sodium chloride (OCEAN) 0.65 % SOLN nasal spray Place 1 spray into both nostrils every 2 (two) hours while awake. 04/15/19  Yes Betancourt, Aura Fey, NP  venlafaxine XR (EFFEXOR-XR) 150 MG 24 hr capsule Take 1 capsule (150 mg total) by mouth daily with breakfast. Take one capsule daily 01/31/19  Yes Rutherford Guys, MD  vitamin C (ASCORBIC ACID) 500 MG tablet Take 500 mg by mouth daily.   Yes [provider]  Grant Ruts INHUB 250-50 MCG/DOSE AEPB  Inhale 1 puff into the lungs 2 (two) times daily. 03/27/19  Yes [provider]  Zinc 30 MG TABS Take by mouth.   Yes [provider]    Past Medical History:  Diagnosis Date  . Anemia   . Anxiety   . Asthma   . COVID-19 04/29/2019  . COVID-19 virus detected 04/11/2019  . Diabetes mellitus type II   . Hyperlipidemia   . Hypertension   . NSVD (normal spontaneous vaginal delivery)    x 2    Past Surgical History:  Procedure Laterality Date  . 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 History of Abd. ultrasound  12/04   negative  . Surg. eval lap cholecstectomy  05/28/2006   deferred by patient  . TUBAL LIGATION  1993   bilateral    Social History   Tobacco Use  . Smoking status: Never Smoker  . Smokeless tobacco: Never Used  Substance Use Topics  . Alcohol use: Yes    Alcohol/week: 1.0 - 2.0 standard drinks    Types: 1 - 2 Standard drinks or equivalent per week    Comment: Occasional beer    Family History  Problem Relation Age of Onset  . Breast cancer Mother 71       pt's mother passed away from breast cancer when pt was 53 yo  . Diabetes Maternal Grandmother     Review of Systems  Constitutional: Positive for malaise/fatigue. Negative for chills and fever.  Respiratory: Positive for shortness of breath. Negative for cough.   Cardiovascular: Negative for chest pain, palpitations and leg swelling.  Gastrointestinal: Negative for abdominal pain, nausea and vomiting.     OBJECTIVE:  Today's Vitals   05/02/19 1623  BP: 135/82  Pulse: (!) 105  Temp: 98.2 F (36.8 C)  Weight: 218 lb (98.9 kg)  Height: _0  (1.803 m)   Body mass index is 30.4 kg/m.   Physical Exam Vitals signs and nursing note reviewed.  Constitutional:      Appearance: She is well-developed.  HENT:     Head: Normocephalic and atraumatic.     Mouth/Throat:     Pharynx: No oropharyngeal exudate.  Eyes:     General: No scleral icterus.     Conjunctiva/sclera: Conjunctivae normal.     Pupils: Pupils are equal, round, and reactive to light.  Neck:     Musculoskeletal: Neck supple.  Cardiovascular:     Rate and Rhythm: Normal rate and regular rhythm.     Heart sounds: Normal heart sounds. No murmur. No friction rub. No gallop.   Pulmonary:     Effort: Pulmonary effort is normal.     Breath sounds:  Normal breath sounds. No wheezing or rales.  Skin:    General: Skin is warm and dry.  Neurological:     Mental Status: She is alert and oriented to person, place, and time.     Results for orders placed or performed in visit on 05/02/19 (from the past 24 hour(s))  POCT glycosylated hemoglobin (Hb A1C)     Status: Abnormal   Collection Time: 05/02/19  5:05 PM  Result Value Ref Range   Hemoglobin A1C 6.9 (A) 4.0 - 5.6 %   HbA1c POC (<> result, manual entry)     HbA1c, POC (prediabetic range)     HbA1c, POC (controlled diabetic range)      No results found.   ASSESSMENT and PLAN  1. Type 2 diabetes mellitus with hyperglycemia, without long-term current use of insulin (HCC) Controlled. Continue current regime.  - POCT glycosylated hemoglobin (Hb A1C) - Microalbumin / creatinine urine ratio - Comprehensive metabolic panel  2. Essential hypertension, benign Controlled. Continue current regime.   3. Hypercholesteremia Checking labs today, medications will be adjusted as needed.  - Lipid panel  Return in about 6 months (around 10/31/2019).    Rutherford Guys, MD Primary Care at [place], [place] [address] Ph.  [phone] Fax [phone]

## 2019-05-03 LAB — COMPREHENSIVE METABOLIC PANEL
ALT: 44 IU/L — ABNORMAL HIGH (ref 0–32)
AST: 30 IU/L (ref 0–40)
Albumin/Globulin Ratio: 2.2 (ref 1.2–2.2)
Albumin: 4.7 g/dL (ref 3.8–4.9)
Alkaline Phosphatase: 79 IU/L (ref 39–117)
BUN/Creatinine Ratio: 18 (ref 9–23)
BUN: 17 mg/dL (ref 6–24)
Bilirubin Total: <0.2 mg/dL (ref 0.0–1.2)
CO2: 23 mmol/L (ref 20–29)
Calcium: 10.1 mg/dL (ref 8.7–10.2)
Chloride: 100 mmol/L (ref 96–106)
Creatinine, Ser: 0.92 mg/dL (ref 0.57–1.00)
GFR calc Af Amer: 82 mL/min/{1.73_m2} (ref >59)
GFR calc non Af Amer: 71 mL/min/{1.73_m2} (ref >59)
Globulin, Total: 2.1 g/dL (ref 1.5–4.5)
Glucose: 169 mg/dL — ABNORMAL HIGH (ref 65–99)
Potassium: 4.3 mmol/L (ref 3.5–5.2)
Sodium: 137 mmol/L (ref 134–144)
Total Protein: 6.8 g/dL (ref 6.0–8.5)

## 2019-05-03 LAB — LIPID PANEL
Chol/HDL Ratio: 2.8 ratio (ref 0.0–4.4)
Cholesterol, Total: 175 mg/dL (ref 100–199)
HDL: 62 mg/dL (ref >39)
LDL Chol Calc (NIH): 81 mg/dL (ref 0–99)
Triglycerides: 192 mg/dL — ABNORMAL HIGH (ref 0–149)
VLDL Cholesterol Cal: 32 mg/dL (ref 5–40)

## 2019-05-03 LAB — MICROALBUMIN / CREATININE URINE RATIO
Creatinine, Urine: 73.2 mg/dL
Microalb/Creat Ratio: <4 mg/g creat (ref 0–29)
Microalbumin, Urine: <3 ug/mL

## 2019-05-09 ENCOUNTER — Other Ambulatory Visit: Payer: Self-pay

## 2019-05-09 ENCOUNTER — Telehealth (INDEPENDENT_AMBULATORY_CARE_PROVIDER_SITE_OTHER): Payer: PRIVATE HEALTH INSURANCE | Admitting: Adult Health Nurse Practitioner

## 2019-05-09 ENCOUNTER — Encounter: Payer: Self-pay | Admitting: Adult Health Nurse Practitioner

## 2019-05-09 VITALS — Temp 97.5°F

## 2019-05-09 DIAGNOSIS — J069 Acute upper respiratory infection, unspecified: Secondary | ICD-10-CM | POA: Diagnosis not present

## 2019-05-09 NOTE — Progress Notes (Signed)
Today is last day of working four hours shift. If all is well. Would note to back to work full.   F/u For COVID. FMLA was done for this?  Feeling fine and having some allergie problem

## 2019-05-09 NOTE — Progress Notes (Signed)
Telemedicine Encounter- SOAP NOTE Established Patient  This telephone encounter was conducted with the patient's (or proxy's) verbal consent via audio telecommunications: yes/no: Yes Patient was instructed to have this encounter in a suitably private space; and to only have persons present to whom they give permission to participate. In addition, patient identity was confirmed by use of name plus two identifiers (DOB and address).  I discussed the limitations, risks, security and privacy concerns of performing an evaluation and management service by telephone and the availability of in person appointments. I also discussed with the patient that there may be a patient responsible charge related to this service. The patient expressed understanding and agreed to proceed.  I spent a total of TIME; 0 MIN TO 60 MIN: 20 minutes talking with the patient or their proxy.    Subjective   Kelly Reed is a 53 y.o. established patient. Telephone visit today for f/u Covid/URI symptoms and FmLA  HPI  Patient reports that after 1 week of working 1/2 days, her energy has improved.  She is having some allergic symptoms today that are affecting her but SOB and fatigue has improved enough to where she can work 8 hours she feels.  Breathing is better as well.  She would like intermittent FMLA as she continues to recover and I do not think this request is unreasonable.   Patient Active Problem List   Diagnosis Date Noted  . Acute URI 06/14/2018  . Flu-like symptoms 06/14/2018  . Cough 08/21/2017  . Lower respiratory infection 08/21/2017  . Sinus congestion 08/21/2017  . History of asthma 08/21/2017  . Asthma 09/20/2016  . Chronic seasonal allergic rhinitis 09/20/2016  . Anemia, iron deficiency 08/07/2014  . Allergic reaction to chemical substance 08/07/2014  . Muscle cramp, nocturnal 08/07/2014  . Essential hypertension, benign 12/07/2012  . Polypharmacy 12/07/2012  . Ganglion cyst 10/22/2012  .  Hypercholesteremia 05/04/2011  . Anxiety and depression 02/01/2011  . EDEMA 03/04/2009  . Allergic state 11/16/2006  . Diabetes mellitus, type II ([place]) 10/25/2006    Past Medical History:  Diagnosis Date  . Anemia   . Anxiety   . Asthma   . COVID-19 04/29/2019  . COVID-19 virus detected 04/11/2019  . Diabetes mellitus type II   . Hyperlipidemia   . Hypertension   . NSVD (normal spontaneous vaginal delivery)    x 2    Current Outpatient Medications  Medication Sig Dispense Refill  . ACCU-CHEK FASTCLIX LANCETS MISC U UTD TO CHECK BG TID  11  . ACCU-CHEK GUIDE test strip U UTD TO CHECK BG TID  11  . albuterol (VENTOLIN HFA) 108 (90 Base) MCG/ACT inhaler INHALE 2 PUFFS BY MOUTH INTO THE LUNGS EVERY 6 HOURS AS NEEDED WHEEZING OR SHORTNESS OF BREATH 18 g 5  . aspirin EC 81 MG tablet Take 1 tablet (81 mg total) by mouth daily.    Marland Kitchen atorvastatin (LIPITOR) 20 MG tablet Take 1 tablet (20 mg total) by mouth daily at 6 PM. 90 tablet 1  . Blood Glucose Monitoring Suppl (ACCU-CHEK GUIDE ME) w/Device KIT U UTD TO CHECK BG TID  0  . Epinastine HCl 0.05 % ophthalmic solution Place 1 drop into both eyes 2 (two) times daily. 5 mL 2  . ferrous sulfate 325 (65 FE) MG tablet Take 325 mg by mouth every other day.     . fluticasone (FLONASE) 50 MCG/ACT nasal spray Place 1 spray into both nostrils 2 (two) times daily. 16 g 6  .  levocetirizine (XYZAL) 5 MG tablet Take 1 tablet by mouth every evening.   9  . losartan (COZAAR) 50 MG tablet TAKE 1 TABLET(25 MG) BY MOUTH DAILY 90 tablet 1  . MAGNESIUM PO Take by mouth.    . metFORMIN (GLUCOPHAGE) 1000 MG tablet Take 1 tablet (1,000 mg total) by mouth 2 (two) times daily with a meal. 180 tablet 0  . montelukast (SINGULAIR) 10 MG tablet TAKE 1 TABLET(10 MG) BY MOUTH AT BEDTIME 90 tablet 3  . Multiple Vitamins-Minerals (CENTRUM ADULTS PO) Take by mouth.    . sitaGLIPtin (JANUVIA) 100 MG tablet Take 1 tablet (100 mg total) by mouth daily. 30 tablet 3  . sodium  chloride (OCEAN) 0.65 % SOLN nasal spray Place 1 spray into both nostrils every 2 (two) hours while awake.    . venlafaxine XR (EFFEXOR-XR) 150 MG 24 hr capsule Take 1 capsule (150 mg total) by mouth daily with breakfast. Take one capsule daily 30 capsule 5  . vitamin C (ASCORBIC ACID) 500 MG tablet Take 500 mg by mouth daily.    Grant Ruts INHUB 250-50 MCG/DOSE AEPB Inhale 1 puff into the lungs 2 (two) times daily.    . Zinc 30 MG TABS Take by mouth.     No current facility-administered medications for this visit.     Allergies  Allergen Reactions  . Adhesive [Tape] Rash and Other (See Comments)    Peeling of skin  . Prednisone     Hyperglycemia with oral steroids    Social History   Socioeconomic History  . Marital status: Married    Spouse name: Not on file  . Number of children: 2  . Years of education: Not on file  . Highest education level: Not on file  Occupational History  . Occupation: Retail buyer.    Employer: [employer]  Social Needs  . Financial resource strain: Not on file  . Food insecurity    Worry: Not on file    Inability: Not on file  . Transportation needs    Medical: Not on file    Non-medical: Not on file  Tobacco Use  . Smoking status: Never Smoker  . Smokeless tobacco: Never Used  Substance and Sexual Activity  . Alcohol use: Yes    Alcohol/week: 1.0 - 2.0 standard drinks    Types: 1 - 2 Standard drinks or equivalent per week    Comment: Occasional beer  . Drug use: No  . Sexual activity: Never    Partners: Male    Birth control/protection: Surgical  Lifestyle  . Physical activity    Days per week: Not on file    Minutes per session: Not on file  . Stress: Not on file  Relationships  . Social Herbalist on phone: Not on file    Gets together: Not on file    Attends religious service: Not on file    Active member of club or organization: Not on file    Attends meetings of clubs or organizations: Not on file     Relationship status: Not on file  . Intimate partner violence    Fear of current or ex partner: Not on file    Emotionally abused: Not on file    Physically abused: Not on file    Forced sexual activity: Not on file  Other Topics Concern  . Not on file  Social History Narrative   2 Sons   Works at [employer]   Married  ROS  Review of Systems See HPI Constitution: No fevers or chills No malaise No diaphoresis Skin: No rash or itching Eyes: no blurry vision, no double vision GU: no dysuria or hematuria Neuro: no dizziness or headaches   Objective    GEN: WDWN, NAD, Non-toxic, Alert & Oriented x 3  PSYCH: Normally interactive. Conversant. Not depressed or anxious appearing.  Calm demeanor.    Vitals as reported by the patient: Today's Vitals   05/09/19 0745  Temp: (!) 97.5 F (36.4 C)  TempSrc: Oral    Diagnoses and all orders for this visit:  Acute URI   1.  I have sent a note through Mychart that she can resume working without restrictions.  2.  Intermittent FMLA to be completed today.   I discussed the assessment and treatment plan with the patient. The patient was provided an opportunity to ask questions and all were answered. The patient agreed with the plan and demonstrated an understanding of the instructions.   The patient was advised to call back or seek an in-person evaluation if the symptoms worsen or if the condition fails to improve as anticipated.  I provided 20 minutes of non-face-to-face time during this encounter.  Glyn Ade, NP  Primary Care at [facility]

## 2019-05-15 ENCOUNTER — Telehealth: Payer: Self-pay

## 2019-05-15 NOTE — Telephone Encounter (Signed)
Pt. Called saying that two of the questions on her FMLA were not answered. Pt. Asserts there was no end date listed on the forms and that she needed a concrete date listed for part B of the forms for a follow up date on the conditions that the FMLA are being requested for.

## 2019-05-16 ENCOUNTER — Encounter: Payer: Self-pay | Admitting: Family Medicine

## 2019-05-18 ENCOUNTER — Telehealth: Payer: Self-pay | Admitting: Registered Nurse

## 2019-05-18 ENCOUNTER — Encounter: Payer: Self-pay | Admitting: Registered Nurse

## 2019-05-18 NOTE — Telephone Encounter (Signed)
Telephone message left for patient at home number that I was calling to see if she was feeling any better today with a day off from work yesterday or if she was feeling worse/felt like she needed oral steroids for asthma flare.  Patient to contact RN Hildred Alamin in clinic tomorrow if she would like steroids oral or to schedule appt with me on Tuesday NP Clinic.

## 2019-05-18 NOTE — Telephone Encounter (Signed)
Late entry  Patient presented to [facility] clinic Thursday 05/15/2019 requesting vital signs check feeling fatigued s/p returning to work after covid 19 infection.  RN Hildred Alamin reported Spo2 stable/baseline for patient and BBS CTA afebrile.  Pt is following up with PCM for FMLA paperwork was missing some information and this  was her first full week back at work 40 hours week.    No cough observed, patient spoke full sentences without difficulty; gait sure and steady in clinic; in/out of chair without difficulty.

## 2019-05-18 NOTE — Telephone Encounter (Signed)
Late entry HR Rep Hyacinth Meeker contacted me to notify me patient left early from shift not feeling well 05/16/2019 after feeling really tire, some trouble breathing today and body aches.  Patient though she was having asthma flare and body aches from more lifting/activity at work this week than previous weeks.  She was to follow up with HR Rep on 05/18/2019 regarding if she was feeling well enough to return to work on Monday 11/09.  Discussed with HR Rep covid reinfection is possible at this time it is though patients have 3 months of immunity but more research is still ongoing.  Patient may not yet be ready to resume full time work duties due to other health concerns in addition to recuperation from covid 19.  HR Rep reported understanding and had no further questions at this time.

## 2019-05-20 ENCOUNTER — Other Ambulatory Visit: Payer: Self-pay | Admitting: Family Medicine

## 2019-05-20 NOTE — Telephone Encounter (Signed)
Pt called after calling her ins company [employer] to verify the pprwrk was completed and sent. Ins company confirmed to pt that they do not have the pprwrk and now she if following up with office to check on its status. Pt provided fax number [phone]

## 2019-05-21 ENCOUNTER — Telehealth: Payer: Self-pay | Admitting: Registered Nurse

## 2019-05-21 NOTE — Telephone Encounter (Signed)
Patient contacted at her workcenter.  Feeling much better today even at end shift.  Doesn't feel like she needs steroids.  Breathing easier and feeling better after the weekend off from work.  Patient has no health concerns at this time.  No cough observed, spoke full sentences without difficulty, no SOB/dyspnea/wheezing.

## 2019-05-22 NOTE — Telephone Encounter (Signed)
FMLA forms to be completed by Felton Clinton, FNP whom she saw for covid related concerns.

## 2019-05-23 NOTE — Telephone Encounter (Signed)
Pt is requesting fmla forms to be completed

## 2019-05-26 NOTE — Telephone Encounter (Signed)
Pt called to get an update on fmla forms

## 2019-05-27 NOTE — Telephone Encounter (Signed)
Patient has questions re FMLA forms.

## 2019-05-29 ENCOUNTER — Telehealth: Payer: Self-pay | Admitting: Family Medicine

## 2019-05-29 NOTE — Telephone Encounter (Signed)
Copied from [place] [id number]. Topic: General - Other >> May 29, 2019 12:07 PM Alanda Slim E wrote: Reason for CRM: Pt is waiting on her FMLA paper work to be sent in asap/ she has missed time off work and needs the 2 sentences corrected and faxed back to the [place] today/ please advise asap if this can be done today

## 2019-05-30 NOTE — Telephone Encounter (Signed)
Forms has been updates and re-faxed.

## 2019-06-17 ENCOUNTER — Other Ambulatory Visit: Payer: Self-pay | Admitting: Family Medicine

## 2019-06-17 MED ORDER — SITAGLIPTIN PHOSPHATE 100 MG PO TABS: 100.0000 mg | ORAL_TABLET | Freq: Every day | ORAL | 3 refills | Status: DC

## 2019-06-17 NOTE — Telephone Encounter (Signed)
sitaGLIPtin (JANUVIA) 100 MG tablet   Patient requesting refill.    Pharmacy:  [facility] [place], [place] AT [facility] [phone] (Phone) [phone] (Fax)

## 2019-06-26 ENCOUNTER — Other Ambulatory Visit: Payer: Self-pay

## 2019-06-26 ENCOUNTER — Encounter: Payer: Self-pay | Admitting: Registered Nurse

## 2019-06-26 ENCOUNTER — Telehealth: Payer: Self-pay | Admitting: Registered Nurse

## 2019-06-26 VITALS — BP 138/75 | HR 89 | Temp 98.2°F | Resp 16 | Wt 207.0 lb

## 2019-06-26 DIAGNOSIS — R52 Pain, unspecified: Secondary | ICD-10-CM

## 2019-06-26 DIAGNOSIS — G9331 Postviral fatigue syndrome: Secondary | ICD-10-CM

## 2019-06-26 DIAGNOSIS — G933 Postviral fatigue syndrome: Secondary | ICD-10-CM

## 2019-06-26 NOTE — Progress Notes (Signed)
Subjective:    Patient ID: Kelly Reed, female    DOB: 19-Jan-1966, 53 y.o.   MRN: [id number]  53y/o Caucasian established female pt c/o fatigue and muscle and joint aches post Covid 19 acute infection positive test 04/10/2019 . "Feel like a 53 year old trying to get out of bed each morning. Having to get up earlier to allow more time to get ready." She is taking multivitamin, magnesium, iron and was taking Zinc unknown mg bottle at home but ran out last week and hasn't been able to find in stores again yet. She also endorses brain fog that started a few days ago and was not previously a concern for her. 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  PMHx plantar fasciitis/heel spur [employer] mens shoes 3 months old not wearing compression or plantar fasciitis socks or heel cups/orthotic inserts her feet hurt end of day but not in the morning.  She is performing daily stretches from [facility]    Type II diabetes has lost weight due to no appetite still decreased intake and very physically active at work.  Highest blood sugar reading at home 125 but typically under 100 now.  May 02, 2019 last Hgba1c 6.9  Glucose 169  Jul 2020 7.2; last vitamin D level 2017 35.7; Last CRP on file Jan 2016 less than 0.5.  Patient denied rashes, swelling in joints, fever/chills, n/v/d/abdomen pain, dsyphagia.  Asthma using her inhalers as prescribed not requiring additional albuterol on regular basis.  Typically getting 6 hours or less sleep per night due to work shift starts at 0600.  This schedule has been ongoing for months due to covid pandemic shift changes at work to socially distance workers.  She typically wakes up 0400.  Hard time getting to sleep earlier at night due to home duties.  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        Review of Systems   Constitutional: Positive for activity change, appetite change and fatigue. Negative for chills, diaphoresis and fever.  HENT: Negative for congestion, dental problem, drooling, ear discharge, ear pain, facial swelling, hearing loss, mouth sores, nosebleeds, sinus pressure, sinus pain, trouble swallowing and voice change.   Eyes: Negative for photophobia, discharge, itching and visual disturbance.  Respiratory: Positive for shortness of breath. Negative for cough, wheezing and stridor.   Cardiovascular: Negative for chest pain and leg swelling.  Gastrointestinal: Negative for abdominal pain, diarrhea, nausea and vomiting.  Endocrine: Negative for cold intolerance, heat intolerance, polydipsia, polyphagia and polyuria.  Genitourinary: Negative for difficulty urinating.  Musculoskeletal: Positive for arthralgias and myalgias. Negative for back pain, gait problem, joint swelling and neck stiffness.  Skin: Negative for color change, rash and wound.  Allergic/Immunologic: Positive for environmental allergies and immunocompromised state. Negative for food allergies.  Neurological: Positive for headaches. Negative for dizziness, seizures, syncope, facial asymmetry, speech difficulty, light-headedness and numbness.  Hematological: Negative for adenopathy. Does not bruise/bleed easily.  Psychiatric/Behavioral: Positive for decreased concentration. Negative for agitation and sleep disturbance.       Objective:   Physical Exam Vitals and nursing note reviewed.  Constitutional:      General: She is awake. She is not in acute distress.    Appearance: Normal appearance. She is well-developed, well-groomed and overweight. She is not ill-appearing, toxic-appearing or diaphoretic.  HENT:  Head: Normocephalic and atraumatic.     Jaw: There is normal jaw occlusion. No trismus, swelling, pain on movement or malocclusion.     Salivary Glands: Right salivary gland is not diffusely enlarged or tender. Left  salivary gland is not diffusely enlarged or tender.     Right Ear: Hearing and external ear normal.     Left Ear: Hearing and external ear normal.     Nose: Nose normal.     Mouth/Throat:     Lips: Pink. No lesions.     Mouth: Mucous membranes are moist.     Pharynx: Oropharynx is clear. Uvula midline.  Eyes:     General: Lids are normal. Vision grossly intact. Gaze aligned appropriately. Allergic shiner present. No visual field deficit or scleral icterus.       Right eye: No discharge.        Left eye: No discharge.     Extraocular Movements: Extraocular movements intact.     Conjunctiva/sclera: Conjunctivae normal.     Pupils: Pupils are equal, round, and reactive to light.  Neck:     Thyroid: No thyromegaly.     Trachea: Trachea and phonation normal.  Cardiovascular:     Rate and Rhythm: Normal rate and regular rhythm.     Pulses: Normal pulses.          Radial pulses are 2+ on the right side and 2+ on the left side.     Heart sounds: Normal heart sounds. No murmur. No friction rub. No gallop.   Pulmonary:     Effort: Pulmonary effort is normal. No respiratory distress.     Breath sounds: Normal breath sounds and air entry. No stridor, decreased air movement or transmitted upper airway sounds. No decreased breath sounds, wheezing, rhonchi or rales.     Comments: Spoke full sentences without difficulty; wearing cloth mask due to covid 19 pandemic; no cough observed during video examination Abdominal:     Palpations: Abdomen is soft.  Musculoskeletal:        General: No swelling or deformity. Normal range of motion.     Right shoulder: Normal.     Left shoulder: Normal.     Cervical back: Normal, normal range of motion and neck supple. No swelling, edema, deformity, erythema, signs of trauma, lacerations, rigidity or crepitus. Normal range of motion.     Thoracic back: Normal.     Lumbar back: Normal.     Right upper leg: Normal.     Left upper leg: Normal.     Right knee:  Normal.     Left knee: Normal.     Right lower leg: Normal. No edema.     Left lower leg: Normal. No edema.  Lymphadenopathy:     Head:     Right side of head: No submental, submandibular, tonsillar, preauricular, posterior auricular or occipital adenopathy.     Left side of head: No submental, submandibular, tonsillar, preauricular, posterior auricular or occipital adenopathy.     Cervical: No cervical adenopathy.     Right cervical: No superficial cervical adenopathy.    Left cervical: No superficial cervical adenopathy.  Skin:    General: Skin is warm and dry.     Capillary Refill: Capillary refill takes less than 2 seconds.     Coloration: Skin is not ashen, cyanotic, jaundiced, mottled, pale or sallow.     Findings: No abrasion, abscess, acne, bruising, burn, ecchymosis, erythema, signs of injury, laceration, lesion, petechiae, rash or wound.  Nails: There is no clubbing.  Neurological:     General: No focal deficit present.     Mental Status: She is alert and oriented to person, place, and time. Mental status is at baseline.     GCS: GCS eye subscore is 4. GCS verbal subscore is 5. GCS motor subscore is 6.     Cranial Nerves: Cranial nerves are intact. No cranial nerve deficit, dysarthria or facial asymmetry.     Sensory: Sensation is intact. No sensory deficit.     Motor: Motor function is intact. No weakness, tremor, atrophy, abnormal muscle tone or seizure activity.     Coordination: Coordination is intact. Coordination normal.     Gait: Gait is intact. Gait normal.     Comments: Gait sure and steady; in/out of chair without difficulty; bilateral hand grasp equal 5/5  Psychiatric:        Attention and Perception: Attention and perception normal.        Mood and Affect: Mood and affect normal.        Speech: Speech normal.        Behavior: Behavior normal. Behavior is cooperative.        Thought Content: Thought content normal.        Cognition and Memory: Cognition and  memory normal.        Judgment: Judgment normal.      NP Chamari Cutbirth ordered 280ft walk test and 2 flight walk test.  RN Cheri Guppy performed these tests for me and recorded results.   Vitals done at rest, WNL. Modified Borg scale for dyspnea or fatigue at 3 (moderate) prior to activity. 224ft walk performed. 98% RA SpO2 after walk. 2 flights of stairs, 90% RA SP02 immediately after completion, but almost immediate recovery to 98% RA. Borg scale remains at 3 after activity. No limping observed or crepitus heard.  Patient was observed to be frequently rubbing right anterior thigh by RN Hildred Alamin.  Patient weight 207lbs in clinic today. Loss of 11 lbs since last documented weight earlier this year patient had not been trying to lose weight.     Assessment & Plan:  A-post viral fatigue syndrome and generalized body aches, BMI 28.8  P-Will check CRP, executive panel nonfasting, Vitamin D today.  Discussed with patient sometimes after viral illness thyroid and pancreas can be affected.  Prior chest xray normal prior to covid and none performed during acute illness.  Patient sp02 at baseline today BBS CTA will hold on xray at this time. PMHx asthma consider PFTs if patient continues to reports dyspnea with exertion not improving. Reviewed up to date coronavirus disease 2019 eval and mgmt of persistent symptoms in adults following acute illness.  Discussed with patient not uncommon to have fatigue and less stamina after covid per current research and other patient reports.  She agreed to walk test and lab tests at this time.  Discussed exec panel results will be back tomorrow and to follow up with RN Hildred Alamin in clinic.  CRP may take longer as special test.  Patient ate lunch at 1130 today prior to serum draw.    Discussed trying to alter her home schedule to get more sleep/getting to bed earlier because she is getting less than 7 hours sleep and 7-8 hours per night recommended. Chronic sleep deprivation can cause  fatigue/brain fog also.  Hypoxia/low blood sugar, dehydration, thyroid high/low can also contribute to symptoms.  Low vitamin D can contribute to bone/jt/body aches.  Patient to eat regular  meals and avoid dehydration. Patient reported she is planning to take vacation after next week and she has FMLA if she begins to feel more run down to use from [facility].  [facility] post covid 19 patient information pack Helpin you to recover and manage your symptoms following Covid 19 emailed to patient 24 pages.  Discussed with patient mental health support telephone number is for [place] not [place] last page of pamphlet.  Discussed patient should not have zinc supplement greater than 40mg  per day as can lower copper levels and cause other nerve related problems.  Patient aware her multivitamin has 12 mg zinc and will ensure if she restarts another zinc supplement total does not exceed 40mg  per day per NIH recommendations. CBC to check for anemia.  Discussed with patient none noted on previous 2020 labs. Encouraged patient to rest during holiday/time off and take vacation when able as currently vacation freeze by employer.  Patient also feels obligated to work to help coworkers and employer at this time due to elevated workload due to holidays and online shopping due to covid pandemic.  Patient verbalized understanding information/instructions, agreed with plan of care and had no further questions at this time.   Plantar fasciitis demonstrated exercises gastrocnemius stretches.  History foot pain arch and heel worsens after long day at work. [employer] handouts on plantar fasciitis and plantar fasciitis rehab exercises printed and given to patient. Discussed achilles/gastrocnemius/foot/plantar stretches and icing 15 minutes at least nightly with frozen water bottle rolling foot over.  Discussed new footwear every 3-6 months and reminded holiday sales continuing through January.  If heel inserts still  in stock at clinic RN Haley instructed to give patient one pair to trial in her shoes.  Follow up with PCM if no improvement with discussed care for podiatry referral. Consider new supportive footwear/OTC inserts if shoe treads worn out/greater than 44 year old.  Do not walk barefoot at home or thin leather no support sandals/flip flops/shoes as this can worsen condition/pain.  Consider compression socks/plantar fasciitis socks and weight loss also. Patient verbalized understanding of instructions, agreed with plan of care and had no further questions at this time.  BMI 28 goal 25 or less. continue active lifestyle exercise 150 minutes per week and healthy diet choices. Consider weight loss in 2021.

## 2019-06-26 NOTE — Patient Instructions (Signed)
Plantar Fasciitis Rehab Ask your health care provider which exercises are safe for you. Do exercises exactly as told by your health care provider and adjust them as directed. It is normal to feel mild stretching, pulling, tightness, or discomfort as you do these exercises. Stop right away if you feel sudden pain or your pain gets worse. Do not begin these exercises until told by your health care provider. Stretching and range-of-motion exercises These exercises warm up your muscles and joints and improve the movement and flexibility of your foot. These exercises also help to relieve pain. Plantar fascia stretch  1. Sit with your left / right leg crossed over your opposite knee. 2. Hold your heel with one hand with that thumb near your arch. With your other hand, hold your toes and gently pull them back toward the top of your foot. You should feel a stretch on the bottom of your toes or your foot (plantar fascia) or both. 3. Hold this stretch for____5-15______ seconds. 4. Slowly release your toes and return to the starting position. Repeat ___3_______ times. Complete this exercise _____3_____ times a day. Gastrocnemius stretch, standing This exercise is also called a calf (gastroc) stretch. It stretches the muscles in the back of the upper calf. 1. Stand with your hands against a wall. 2. Extend your left / right leg behind you, and bend your front knee slightly. 3. Keeping your heels on the floor and your back knee straight, shift your weight toward the wall. Do not arch your back. You should feel a gentle stretch in your upper left / right calf. 4. Hold this position for ______5-15____ seconds. Repeat ____3______ times. Complete this exercise _____3_____ times a day. Soleus stretch, standing This exercise is also called a calf (soleus) stretch. It stretches the muscles in the back of the lower calf. 1. Stand with your hands against a wall. 2. Extend your left / right leg behind you, and bend  your front knee slightly. 3. Keeping your heels on the floor, bend your back knee and shift your weight slightly over your back leg. You should feel a gentle stretch deep in your lower calf. 4. Hold this position for ____5-15______ seconds. Repeat ____3______ times. Complete this exercise ______3____ times a day. Gastroc and soleus stretch, standing step This exercise stretches the muscles in the back of the lower leg. These muscles are in the upper calf (gastrocnemius) and the lower calf (soleus). 1. Stand with the ball of your left / right foot on a step. The ball of your foot is on the walking surface, right under your toes. 2. Keep your other foot firmly on the same step. 3. Hold on to the wall or a railing for balance. 4. Slowly lift your other foot, allowing your body weight to press your left / right heel down over the edge of the step. You should feel a stretch in your left / right calf. 5. Hold this position for _5-15_________ seconds. 6. Return both feet to the step. 7. Repeat this exercise with a slight bend in your left / right knee. Repeat _____3_____ times with your left / right knee straight and ___3_______ times with your left / right knee bent. Complete this exercise _____3_____ times a day. Balance exercise This exercise builds your balance and strength control of your arch to help take pressure off your plantar fascia. Single leg stand If this exercise is too easy, you can try it with your eyes closed or while standing on a pillow. 1.  Without shoes, stand near a railing or in a doorway. You may hold on to the railing or door frame as needed. 2. Stand on your left / right foot. Keep your big toe down on the floor and try to keep your arch lifted. Do not let your foot roll inward. 3. Hold this position for ___5-15_______ seconds. Repeat _____3_____ times. Complete this exercise ____3 Fatigue If you have fatigue, you feel tired all the time and have a lack of energy or a lack  of motivation. Fatigue may make it difficult to start or complete tasks because of exhaustion. In general, occasional or mild fatigue is often a normal response to activity or life. However, long-lasting (chronic) or extreme fatigue may be a symptom of a medical condition. Follow these instructions at home: General instructions Watch your fatigue for any changes. Go to bed and get up at the same time every day. Avoid fatigue by pacing yourself during the day and getting enough sleep at night. Maintain a healthy weight. Medicines Take over-the-counter and prescription medicines only as told by your health care provider. Take a multivitamin, if told by your health care provider. Do not use herbal or dietary supplements unless they are approved by your health care provider. Activity  Exercise regularly, as told by your health care provider. Use or practice techniques to help you relax, such as yoga, tai chi, meditation, or massage therapy. Eating and drinking  Avoid heavy meals in the evening. Eat a well-balanced diet, which includes lean proteins, whole grains, plenty of fruits and vegetables, and low-fat dairy products. Avoid consuming too much caffeine. Avoid the use of alcohol. Drink enough fluid to keep your urine pale yellow. Lifestyle Change situations that cause you stress. Try to keep your work and personal schedule in balance. Do not use any products that contain nicotine or tobacco, such as cigarettes and e-cigarettes. If you need help quitting, ask your health care provider. Do not use drugs. Contact a health care provider if: Your fatigue does not get better. You have a fever. You suddenly lose or gain weight. You have headaches. You have trouble falling asleep or sleeping through the night. You feel angry, guilty, anxious, or sad. You are unable to have a bowel movement (constipation). Your skin is dry. You have swelling in your legs or another part of your body. Get  help right away if: You feel confused. Your vision is blurry. You feel faint or you pass out. You have a severe headache. You have severe pain in your abdomen, your back, or the area between your waist and hips (pelvis). You have chest pain, shortness of breath, or an irregular or fast heartbeat. You are unable to urinate, or you urinate less than normal. You have abnormal bleeding, such as bleeding from the rectum, vagina, nose, lungs, or nipples. You vomit blood. You have thoughts about hurting yourself or others. If you ever feel like you may hurt yourself or others, or have thoughts about taking your own life, get help right away. You can go to your nearest emergency department or call: Your local emergency services (911 in the [place] A suicide crisis helpline, such as the [facility] at 306-827-1633. This is open 24 hours a day. Summary If you have fatigue, you feel tired all the time and have a lack of energy or a lack of motivation. Fatigue may make it difficult to start or complete tasks because of exhaustion. Long-lasting (chronic) or extreme fatigue may be a  symptom of a medical condition. Exercise regularly, as told by your health care provider. Change situations that cause you stress. Try to keep your work and personal schedule in balance. This information is not intended to replace advice given to you by your health care provider. Make sure you discuss any questions you have with your health care provider. Document Released: 04/23/2007 Document Revised: 10/17/2018 Document Reviewed: 03/21/2017 Elsevier Patient Education  2020 [employer]. ______ times a day. This information is not intended to replace advice given to you by your health care provider. Make sure you discuss any questions you have with your health care provider. Document Released: 06/26/2005 Document Revised: 10/17/2018 Document Reviewed: 04/24/2018 Elsevier Patient Education  [place]. Plantar Fasciitis  Plantar fasciitis is a painful foot condition that affects the heel. It occurs when the band of tissue that connects the toes to the heel bone (plantar fascia) becomes irritated. This can happen as the result of exercising too much or doing other repetitive activities (overuse injury). The pain from plantar fasciitis can range from mild irritation to severe pain that makes it difficult to walk or move. The pain is usually worse in the morning after sleeping, or after sitting or lying down for a while. Pain may also be worse after long periods of walking or standing. What are the causes? This condition may be caused by:  Standing for long periods of time.  Wearing shoes that do not have good arch support.  Doing activities that put stress on joints (high-impact activities), including running, aerobics, and ballet.  Being overweight.  An abnormal way of walking (gait).  Tight muscles in the back of your lower leg (calf).  High arches in your feet.  Starting a new athletic activity. What are the signs or symptoms? The main symptom of this condition is heel pain. Pain may:  Be worse with first steps after a time of rest, especially in the morning after sleeping or after you have been sitting or lying down for a while.  Be worse after long periods of standing still.  Decrease after 30-45 minutes of activity, such as gentle walking. How is this diagnosed? This condition may be diagnosed based on your medical history and your symptoms. Your health care provider may ask questions about your activity level. Your health care provider will do a physical exam to check for:  A tender area on the bottom of your foot.  A high arch in your foot.  Pain when you move your foot.  Difficulty moving your foot. You may have imaging tests to confirm the diagnosis, such as:  X-rays.  Ultrasound.  MRI. How is this treated? Treatment for plantar fasciitis  depends on how severe your condition is. Treatment may include:  Rest, ice, applying pressure (compression), and raising the affected foot (elevation). This may be called RICE therapy. Your health care provider may recommend RICE therapy along with over-the-counter pain medicines to manage your pain.  Exercises to stretch your calves and your plantar fascia.  A splint that holds your foot in a stretched, upward position while you sleep (night splint).  Physical therapy to relieve symptoms and prevent problems in the future.  Injections of steroid medicine (cortisone) to relieve pain and inflammation.  Stimulating your plantar fascia with electrical impulses (extracorporeal shock wave therapy). This is usually the last treatment option before surgery.  Surgery, if other treatments have not worked after 12 months. Follow these instructions at home:  Managing pain, stiffness, and  swelling  If directed, put ice on the painful area: ? Put ice in a plastic bag, or use a frozen bottle of water. ? Place a towel between your skin and the bag or bottle. ? Roll the bottom of your foot over the bag or bottle. ? Do this for 20 minutes, 2-3 times a day.  Wear athletic shoes that have air-sole or gel-sole cushions, or try wearing soft shoe inserts that are designed for plantar fasciitis.  Raise (elevate) your foot above the level of your heart while you are sitting or lying down. Activity  Avoid activities that cause pain. Ask your health care provider what activities are safe for you.  Do physical therapy exercises and stretches as told by your health care provider.  Try activities and forms of exercise that are easier on your joints (low-impact). Examples include swimming, water aerobics, and biking. General instructions  Take over-the-counter and prescription medicines only as told by your health care provider.  Wear a night splint while sleeping, if told by your health care provider.  Loosen the splint if your toes tingle, become numb, or turn cold and blue.  Maintain a healthy weight, or work with your health care provider to lose weight as needed.  Keep all follow-up visits as told by your health care provider. This is important. Contact a health care provider if you:  Have symptoms that do not go away after caring for yourself at home.  Have pain that gets worse.  Have pain that affects your ability to move or do your daily activities. Summary  Plantar fasciitis is a painful foot condition that affects the heel. It occurs when the band of tissue that connects the toes to the heel bone (plantar fascia) becomes irritated.  The main symptom of this condition is heel pain that may be worse after exercising too much or standing still for a long time.  Treatment varies, but it usually starts with rest, ice, compression, and elevation (RICE therapy) and over-the-counter medicines to manage pain. This information is not intended to replace advice given to you by your health care provider. Make sure you discuss any questions you have with your health care provider. Document Released: 03/21/2001 Document Revised: 06/08/2017 Document Reviewed: 04/23/2017 Elsevier Patient Education  2020 [employer].

## 2019-06-28 LAB — SPECIMEN STATUS REPORT

## 2019-07-01 ENCOUNTER — Other Ambulatory Visit: Payer: Self-pay

## 2019-07-01 ENCOUNTER — Encounter: Payer: Self-pay | Admitting: Registered Nurse

## 2019-07-01 ENCOUNTER — Ambulatory Visit: Payer: Self-pay | Admitting: Registered Nurse

## 2019-07-01 VITALS — HR 82 | Resp 16

## 2019-07-01 DIAGNOSIS — R21 Rash and other nonspecific skin eruption: Secondary | ICD-10-CM

## 2019-07-01 MED ORDER — DIPHENHYDRAMINE HCL 2 % EX GEL: 1.0000 "application " | Freq: Two times a day (BID) | CUTANEOUS | Status: AC | PRN

## 2019-07-01 MED ORDER — HYDROCORTISONE 1 % EX LOTN: 1.0000 "application " | TOPICAL_LOTION | Freq: Two times a day (BID) | CUTANEOUS | 0 refills | Status: AC

## 2019-07-01 NOTE — Progress Notes (Signed)
Subjective:    Patient ID: Kelly Reed, female    DOB: 1966-03-29, 53 y.o.   MRN: [id number]  53y/o caucasian female established patient here for new rash chest and left shoulder.  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  Rash itchy.  Took benadryl and returned to her dept, less sweating, got a little drowsy which typically doesn't happen after she takes benadryl but itching stopped.  Rash still there.  Unsure if she should get her allergy shot today.  Discussed with patient to contact her allergist office and notify them of new rash today at work.  Denied wheezing/chest pain/tongue swelling/dysphagia/dysphasias/n/v/d.  PMHx asthma, iron deficiency anemia, hypertension, Type II diabetes, chronic allergic rhinitis, covid 19 infection 04/10/2019 and patient had prolonged fatigue/shortness of breath unable to return to work immediately full time after 10 day quarantine completed.  Patient evaluated in workcenter due to others sent home sick today and she was unable to leave to come to clinic     Review of Systems  Constitutional: Positive for activity change, diaphoresis and fatigue. Negative for appetite change, chills and fever.  HENT: Negative for facial swelling, trouble swallowing and voice change.   Eyes: Negative for photophobia, pain, discharge, redness, itching and visual disturbance.  Respiratory: Negative for cough, choking, chest tightness, shortness of breath, wheezing and stridor.   Cardiovascular: Negative for chest pain and palpitations.  Gastrointestinal: Negative for abdominal pain, diarrhea, nausea and vomiting.  Endocrine: Negative for cold intolerance and heat intolerance.  Genitourinary: Negative for difficulty urinating.  Musculoskeletal: Negative for neck pain and neck stiffness.  Skin: Positive for color change and rash. Negative for pallor and wound.  Allergic/Immunologic: Positive for environmental allergies and  immunocompromised state. Negative for food allergies.  Neurological: Negative for dizziness, tremors, seizures, syncope, facial asymmetry, speech difficulty, weakness, light-headedness, numbness and headaches.  Hematological: Negative for adenopathy. Does not bruise/bleed easily.  Psychiatric/Behavioral: Negative for agitation, confusion and sleep disturbance.       Objective:   Physical Exam Constitutional:      General: She is awake. She is not in acute distress.    Appearance: Normal appearance. She is well-developed and well-groomed. She is not ill-appearing, toxic-appearing or diaphoretic.  HENT:     Head: Normocephalic and atraumatic.     Jaw: There is normal jaw occlusion.     Right Ear: Hearing and external ear normal.     Left Ear: Hearing and external ear normal.     Nose: Nose normal.     Mouth/Throat:     Lips: Pink. No lesions.     Mouth: Mucous membranes are moist.     Pharynx: Oropharynx is clear. Uvula midline.  Eyes:     General: Lids are normal. Vision grossly intact. Gaze aligned appropriately. Allergic shiner present. No visual field deficit or scleral icterus.       Right eye: No discharge.        Left eye: No discharge.     Extraocular Movements: Extraocular movements intact.     Conjunctiva/sclera: Conjunctivae normal.     Pupils: Pupils are equal, round, and reactive to light.  Neck:     Trachea: Trachea normal.  Cardiovascular:     Rate and Rhythm: Normal rate and regular rhythm.     Pulses: Normal pulses.          Radial pulses are 2+ on the right side and 2+ on the left side.  Heart sounds: Normal heart sounds. No murmur. No friction rub. No gallop.   Pulmonary:     Effort: Pulmonary effort is normal. No respiratory distress.     Breath sounds: Normal breath sounds and air entry. No stridor, decreased air movement or transmitted upper airway sounds. No decreased breath sounds, wheezing, rhonchi or rales.     Comments: Spoke full sentences without  difficulty; wearing cloth mask due to covid 19 pandemic; no cough observed in workcenter Abdominal:     Palpations: Abdomen is soft.  Musculoskeletal:        General: No signs of injury. Normal range of motion.     Right shoulder: Normal.     Left shoulder: Normal.     Right elbow: Normal.     Left elbow: Normal.     Right wrist: Normal.     Left wrist: Normal.     Right hand: Normal.     Left hand: Normal.     Cervical back: Normal, normal range of motion and neck supple. No edema, erythema, signs of trauma, rigidity, torticollis, tenderness or crepitus. Normal range of motion.     Thoracic back: Normal.     Lumbar back: Normal.     Right hip: Normal.     Left hip: Normal.     Right knee: Normal.     Left knee: Normal.     Right lower leg: Normal. No edema.     Left lower leg: Normal. No edema.  Lymphadenopathy:     Head:     Right side of head: No submental, submandibular, tonsillar, preauricular, posterior auricular or occipital adenopathy.     Left side of head: No submental, submandibular, tonsillar, preauricular, posterior auricular or occipital adenopathy.     Cervical: No cervical adenopathy.     Right cervical: No superficial cervical adenopathy.    Left cervical: No superficial cervical adenopathy.  Skin:    General: Skin is warm and dry.     Capillary Refill: Capillary refill takes less than 2 seconds.     Coloration: Skin is not ashen, cyanotic, jaundiced, mottled, pale or sallow.     Findings: Erythema and rash present. No abrasion, abscess, acne, bruising, burn, ecchymosis, signs of injury, laceration, lesion, petechiae or wound. Rash is macular and papular. Rash is not crusting, nodular, purpuric, pustular, scaling, urticarial or vesicular.     Nails: There is no clubbing.       Neurological:     General: No focal deficit present.     Mental Status: She is alert and oriented to person, place, and time. Mental status is at baseline.     GCS: GCS eye subscore is  4. GCS verbal subscore is 5. GCS motor subscore is 6.     Cranial Nerves: Cranial nerves are intact. No cranial nerve deficit, dysarthria or facial asymmetry.     Sensory: Sensation is intact. No sensory deficit.     Motor: Motor function is intact. No weakness, tremor, atrophy, abnormal muscle tone or seizure activity.     Coordination: Coordination is intact. Coordination normal.     Gait: Gait normal.     Comments: Patient observed in workcenter walking into [employer] and annotating/supervising shipping dept; gait sure and steady; bilateral hand grasp equal 5/5  Psychiatric:        Attention and Perception: Attention and perception normal.        Mood and Affect: Mood and affect normal.  Speech: Speech normal.        Behavior: Behavior normal. Behavior is cooperative.        Thought Content: Thought content normal.        Cognition and Memory: Cognition and memory normal.        Judgment: Judgment normal.           Assessment & Plan:  A-rash  P-I recommended patient contact her allergist office and notify them new rash today had to take benadryl prn.  Differential diagnosis contact dermatitis versus xanthem s/p covid 19 infection.  Discussed with patient some covid have developed rashes in prodrome others after acute phase post discharge from hospital we are still learning about disease course/progression.  Per up to date chilblains common and seen with exposure to cold treated with topical corticosteroids and oral nifedipine.  Patient to trial topical calagel 2% topical BID prn itching and may alternate with hydrocortisone 1% topical BID x 7 days.  Given 4 UD from clinic stock.  Consider starting prednisone 10mg  po daily with breakfast x 10 days then 5mg  po x4 days #21 RF0 dispensed from [facility] if no improvement with oral antihistamine and topical antihistamine/corticosteroid and showering immediately after work tepid not steamy.  Apply emollient after shower.   DIscussed contact dermatitis has been common due to new area disinfectants due to covid pandemic and manufacturers keep switching at work use due to supply shortages hand sanitizers/disinfectants.  Tolerating benadryl 25mg  po QID prn itching with mild drowsiness.  Denied other side effects may continue or switch to zyrtec 10mg  po BID prn itching.  calagel thin smear BID prn itching given 4 UD from clinic stock; do not get in eyes; if worsening with calagel use stop and trial hydrocortisone 1% topical BID small smear affected areas.  Wash hands before and after application.  Avoid hot steam showers.  Apply emollient twice a day e.g. Fragrance free vaseline.  May apply ice/cold compress 5 minutes QID prn itching/swelling.  Avoid rubbing/scratching affected areas   Shower when she finishes work/prior to bedtime.  Avoid harsh/abrasive soaps use fragrance free/sensitive like dove/cetaphil.    Medication as directed. Call or return to clinic as needed if these symptoms worsen or fail to improve as anticipated. Exitcare handout on contact dermatitis and pruritis  Follow up for re-evaluation in 48 hours if no improvement and/or worsening of rash with plan of care. Patient verbalized agreement and understanding of treatment plan and had no further questions at this time

## 2019-07-01 NOTE — Patient Instructions (Addendum)
Kelly Reed as discussed this may be an irritant rash or related to your Covid 19 infection.  Please see American Academy of Dermatology website at  AAD.org covid 19 information then covid toes and rashes as the pictures didn't paste into the instructions box from the website  This may be an irritant rash from cleaners/hand sanitizers also Apply a fragrance free moisturizer every day after your shower/bath during winter  Rash, Adult  A rash is a change in the color of your skin. A rash can also change the way your skin feels. There are many different conditions and factors that can cause a rash. Follow these instructions at home: The goal of treatment is to stop the itching and keep the rash from spreading. Watch for any changes in your symptoms. Let your doctor know about them. Follow these instructions to help with your condition: Medicine Take or apply over-the-counter and prescription medicines only as told by your doctor. These may include medicines:  To treat red or swollen skin (corticosteroid creams).  To treat itching.  To treat an allergy (oral antihistamines).  To treat very bad symptoms (oral corticosteroids).  Skin care  Put cool cloths (compresses) on the affected areas.  Do not scratch or rub your skin.  Avoid covering the rash. Make sure that the rash is exposed to air as much as possible. Managing itching and discomfort  Avoid hot showers or baths. These can make itching worse. A cold shower may help.  Try taking a bath with: ? Epsom salts. You can get these at your local pharmacy or grocery store. Follow the instructions on the package. ? Baking soda. Pour a small amount into the bath as told by your doctor. ? Colloidal oatmeal. You can get this at your local pharmacy or grocery store. Follow the instructions on the package.  Try putting baking soda paste onto your skin. Stir water into baking soda until it gets like a paste.  Try putting on a lotion that relieves  itchiness (calamine lotion).  Keep cool and out of the sun. Sweating and being hot can make itching worse. General instructions   Rest as needed.  Drink enough fluid to keep your pee (urine) pale yellow.  Wear loose-fitting clothing.  Avoid scented soaps, detergents, and perfumes. Use gentle soaps, detergents, perfumes, and other cosmetic products.  Avoid anything that causes your rash. Keep a journal to help track what causes your rash. Write down: ? What you eat. ? What cosmetic products you use. ? What you drink. ? What you wear. This includes jewelry.  Keep all follow-up visits as told by your doctor. This is important. Contact a doctor if:  You sweat at night.  You lose weight.  You pee (urinate) more than normal.  You pee less than normal, or you notice that your pee is a darker color than normal.  You feel weak.  You throw up (vomit).  Your skin or the whites of your eyes look yellow (jaundice).  Your skin: ? Tingles. ? Is numb.  Your rash: ? Does not go away after a few days. ? Gets worse.  You are: ? More thirsty than normal. ? More tired than normal.  You have: ? New symptoms. ? Pain in your belly (abdomen). ? A fever. ? Watery poop (diarrhea). Get help right away if:  You have a fever and your symptoms suddenly get worse.  You start to feel mixed up (confused).  You have a very bad headache or a stiff  neck.  You have very bad joint pains or stiffness.  You have jerky movements that you cannot control (seizure).  Your rash covers all or most of your body. The rash may or may not be painful.  You have blisters that: ? Are on top of the rash. ? Grow larger. ? Grow together. ? Are painful. ? Are inside your nose or mouth.  You have a rash that: ? Looks like purple pinprick-sized spots all over your body. ? Has a "bull's eye" or looks like a target. ? Is red and painful, causes your skin to peel, and is not from being in the sun too  long. Summary  A rash is a change in the color of your skin. A rash can also change the way your skin feels.  The goal of treatment is to stop the itching and keep the rash from spreading.  Take or apply over-the-counter and prescription medicines only as told by your doctor.  Contact a doctor if you have new symptoms or symptoms that get worse.  Keep all follow-up visits as told by your doctor. This is important. This information is not intended to replace advice given to you by your health care provider. Make sure you discuss any questions you have with your health care provider. Document Released: 12/13/2007 Document Revised: 10/18/2018 Document Reviewed: 01/28/2018 Elsevier Patient Education  2020 [place], RASHES: HOW THE CORONAVIRUS CAN AFFECT YOUR SKIN  If you develop a rash, tell your doctor A rash may be a sign of COVID-19. A rash can also be a sign of another disease.  If you're on the lookout for symptoms of COVID-19, you're likely watching for a dry cough, fever, and shortness of breath. You may also want to check your skin. While less common, the coronavirus can affect your skin. For some people, this may be the only sign of a coronavirus infection.   COVID toes: One or more toes may swell and turn pink, red, or a purplish color. Here's what we know so far about how the coronavirus may affect the skin.  COVID toes can develop at any age Doctors around the world have noticed that some patients who test positive for the coronavirus develop discolored and swollen toes. Here's what dermatologists are seeing. While COVID toes can appear at any age, children, teenagers, and young adults seem most likely to develop this condition. These young patients are healthy. Many never develop other, more common symptoms of COVID-19, such as a dry cough, fever, and muscle aches. When they do have symptoms of COVID-19, the symptoms tend to be mild.   In skin of color,  COVID toes can cause a purplish discoloration, as the toe circled in red shows. You may also see swelling and round brownish purple spots (B). What you may see with COVID toes: The condition may develop on your toes, fingers, or both. From what we know, it seems that most people develop this only on their toes, which explains the name "COVID toes."  The swelling and discoloration can begin on one or several toes or fingers, according to Paulino Door, MD, FAAD, who is a board-certified pediatric dermatologist and Chair of Dermatology at [facility]. At first, you might see a bright red color that gradually turns to purple. COVID toes can also begin with a purplish color. Symptoms: Many people don't feel anything and only realize that they have COVID toes when they see the discoloration and swelling on their  feet (or hands). Along with the swelling and discoloration, COVID toes can also cause blisters, itch, or pain. Some people develop painful raised bumps or areas of rough skin.   COVID toes: One or more toes may swell and turn pink, red, or a purplish color. Others may see a small amount of pus under their skin.  Sometimes, people who have COVID toes have other symptoms of COVID-19. Treatment for COVID toes: To reduce pain or itching, apply a hydrocortisone cream to the affected area. If this fails to bring relief or symptoms worsen, contact a board-certified dermatologist. How long COVID toes last: While COVID toes go away without treatment, it's still too soon to know how long most people have the condition. Dr. Brock Bad says it can last 10 to 14 days. Some patients have COVID toes for months.  In children and teens, the coronavirus can also lead to a rare, serious, and sometimes life-threatening condition called Multisystem Inflammatory Syndrome in Children (MIS-C).  Are COVID toes contagious? Much remains unknown about the coronavirus, including whether you're  contagious when you have COVID toes. If you have any symptoms of COVID-19, contact your doctor. Explain what's happening and ask if you need testing. Condition that COVID toes can look like: Chilblains If you have swollen, discolored toes or fingers, you may have chilblains instead of COVID toes. Chilblains develops when you're exposed to cold temperatures. People develop chilblains when they stand on a cool, wet ground and get chilled.   Itchy rash: While in the hospital for COVID-19, this patient developed an itchy rash on both sides of his body. Chilblains causes the skin on your toes, other parts of your feet, fingers, or other affected areas to burn and itch. Some people see their skin swell and turn red or dark blue. A severe case of chilblains can cause sores or blisters.  Chilblains differs from frostbite, which occurs when the skin freezes. Coronavirus rash appears in many ways Many diseases, such as measles and chickenpox, cause a distinctive rash that helps doctors diagnose a patient. COVID-19 is different. There is no single COVID-19 rash. What you may see: You can have COVID-19 and never develop a rash. When a patient with COVID-19 does develop a rash, it can look like any of the following:  Hives-like rash: Dermatologists are seeing patients with COVID-19 who develop a rash that looks like hives.  ? Patchy rash ? Itchy bumps ? Blisters that look like chickenpox ? Round, pinpoint spots on the skin ? Large patch with several smaller ones ? A lace-like pattern on the skin ? Flat spots and raised bumps that join together Symptoms: Some rashes itch. Treatment: Some rashes require medical treatment.  Rash on COVID-19 patient's thigh: This rash could also be mistaken for hives.  Keep in mind that you can have a rash that's due to a condition other than COVID-19. If you develop a rash, contact your doctor. How long does a COVID-19 rash last? More information is needed to know for  sure. Right now, reports suggest that a rash typically lasts between 2 and 12 days, with most people having a rash for 8 days. Why does COVID-19 only affect some people's skin? COVID-19 is a new disease, so doctors are still learning about it. Dermatologists are leading the effort to find out exactly how the coronavirus affects the skin.  Measles-like bumps on the foot of a patient with COVID-19: Dermatologists have seen bumps that look like measles on the chest, back, and other  areas of patients who have COVID-19.  If you develop a rash or COVID toes, you can help others If you test positive for the coronavirus and develop a rash or COVID toes, you can help doctors learn more about COVID-19. To help, ask your doctor to submit information to the American Academy of Dermatology's COVID-19 registry. Doctors from around the world are encouraged to participate. Related AAD resources ? Rash: Sign of coronavirus in kids ? AAD [place] Images 2-4, 5-8: Images used with permission of Journal of the American Academy of Dermatology and JAAD Case Reports. ? JAAD Case Rep. [id number]. ? JAAD Case Rep. [id number]. ? J Am Acad Dermatol. 2020 May 4;S0190-9622(20)30789-1. ? J Am Acad Dermatol. 2020 Apr 10;S0190-9622(20)30556-9. ? J Am Acad Dermatol. 2020 Jul;83(1):e61-e63. 1, 5: Getty Images References Sharmaine Base. "Pediatric dermatologist explains 'COVID toes.'" Dermatology Times, Nov 27, 2018. Bosworth T. "Heterogeneity seen in COVID-19 skin manifestations." [facility]. Nov 12, 2018. Last accessed Nov 15, 2018. Harland German ST, et al. "Kawasaki disease: Part I. Diagnosis, clinical features, and pathogenesis." J Am Acad Dermatol 2013;69:501.e1e11. Daneshjou R, Rana J, et al. "Pernio-like eruption associated with COVID-19 in skin of color." JAAD Case Reports 2020;6:892-7. Erle Crocker, Bouaziz JD, et al. "Chilblains are a common cutaneous finding during the COVID-19 pandemic:  a retrospective nationwide study from [place]." Artesian 2020 May 4. doi: 10.1016/j.jaad.2020.04.161. [Epub ahead of print]. Margaretha Seeds A, Prez-Santiago L, et al. "Cutaneous manifestations in COVID-19: A new contribution." J Eur Acad Dermatol Venereol 2020 Apr 15. doi: 10.1111/jdv.16474. [Epub ahead of print]. Fernandez-Nieto D, Jimenez-Cauhe J, et al. "Characterization of acute acro-ischemic lesions in non-hospitalized patients: a case series of 132 patients during the COVID-19 outbreak." J Am Acad Dermatol 2020. Apr 24. doi: 10.1016/j.jaad.2020.04.093. [Epub ahead of print]. Domingo Cocking EE, McMahon DE, et al. "The AAD COVID-19 Registry: Crowdsourcing dermatology in the age of COVID-53." J Am Acad Dermatol. 2020 Apr 16. doi: 10.1016/j.jaad.2020.04.045. [Epub ahead of print]. Fidela Salisbury, et al. "Urticarial eruption in COVID-19 infection." J Eur Acad Dermatol Venereol 2020 Apr 15. doi:10.1111/jdv.16472. [Epub ahead of print]. Noralee Space, et al. "Evaluation of chilblains as a manifestation of the COVID-19 Pandemic. JAMA Dermatol. 2020 Jun 25. doi:10.1001/jamadermatol.2020.2368. [Epub ahead of print]. [place]. "Focus on "COVID toes." JAMA Dermatol. 2020 Jun 25. doi:10.1001/jamadermatol.2020.2062. [Epub ahead of print]. Elige Ko. "The profound dermatological manifestations of COVID-19 - Cutaneous features." Dermatol World Insights and Inquiries. Vol. 2, No. 16. October 30, 2018. Last accessed Nov 14, 2018. Jimenez-Cauhe J, Ortega-Quijano D, et al. "Reply to: COVID-19 can present with a rash and be mistaken for dengue: Petechial rash in a patient with COVID-19 infection." J Am Acad Dermatol. 2020 Apr 10. doi: 10.1016/j.jaad.2020.04.016. [Epub ahead of print]. Kolivras A, Dehavay F, et al. "Coronavirus (COVID-19) infection-induced chilblains: A case report with histopathologic findings." JAAD Case Rep. 2020 Apr 18. doi: 10.1016/j.jdcr.2020.04.011. [Epub ahead of  print]. Marzano AV, Christiana Fuchs, et al. "Varicella-like exanthem as a specific COVID-19-associated skin manifestation: multicenter case series of 22 patients." J Am Acad Dermatol 2020 Apr 16. doi: 10.1016/j.jaad.2020.04.044. [Epub ahead of print]. Viviann Spare. "Morbilliform exanthem associated with COVID-19." JAAD Case Rep. 2020 Apr 20. doi: 10.1016/j.jdcr.2020.04.015. [Epub ahead of print]. Ortega-Quijano D, Jimenez-Cauhe J, et al. "Reply to "Varicella-like exanthem as a specific COVID-et al. 19-associated skin manifestation: multicenter case series of 22 patients": discussing specificity." J Am Acad Dermatol 2020 May 4. doi: 10.1016/j.jaad.2020.04.156. [Epub ahead of print]. Quintana-Castanedo L, Feito-Rodrguez M, et al. Marland Kitchen  Urticarial exanthem as early diagnostic clue for COVID-19 infection," JAAD Case Rep. 2020 Apr 29. doi: 10.1016/j.jdcr.2020.04.026. [Epub ahead of print]. Rivera-Oyola R, Koschitzky M, et al. "Dermatologic findings in two patients with COVID-19." JAAD Case Rep. 2020 Apr 28. doi: 10.1016/j.jdcr.2020.04.027. [Epub ahead of print]. Suchonwanit P, Leerunyakul K, et al. "Cutaneous manifestations in COVID-19: Lessons learned from current evidence." J Am Acad Dermatol. 2020 Apr 24. doi: 10.1016/j.jaad.2020.04.094. [Epub ahead of print].

## 2019-07-02 LAB — CMP12+LP+TP+TSH+6AC+CBC/D/PLT
ALT: 45 IU/L — ABNORMAL HIGH (ref 0–32)
AST: 29 IU/L (ref 0–40)
Albumin/Globulin Ratio: 2.2 (ref 1.2–2.2)
Albumin: 4.6 g/dL (ref 3.8–4.9)
Alkaline Phosphatase: 84 IU/L (ref 39–117)
BUN/Creatinine Ratio: 15 (ref 9–23)
BUN: 13 mg/dL (ref 6–24)
Basophils Absolute: 0.1 10*3/uL (ref 0.0–0.2)
Basos: 1 %
Bilirubin Total: 0.3 mg/dL (ref 0.0–1.2)
Calcium: 9.4 mg/dL (ref 8.7–10.2)
Chloride: 101 mmol/L (ref 96–106)
Chol/HDL Ratio: 2.8 ratio (ref 0.0–4.4)
Cholesterol, Total: 161 mg/dL (ref 100–199)
Creatinine, Ser: 0.87 mg/dL (ref 0.57–1.00)
EOS (ABSOLUTE): 0.1 10*3/uL (ref 0.0–0.4)
Eos: 2 %
Estimated CHD Risk: <0.5 times avg. (ref 0.0–1.0)
Free Thyroxine Index: 1.9 (ref 1.2–4.9)
GFR calc Af Amer: 87 mL/min/{1.73_m2} (ref >59)
GFR calc non Af Amer: 75 mL/min/{1.73_m2} (ref >59)
GGT: 20 IU/L (ref 0–60)
Globulin, Total: 2.1 g/dL (ref 1.5–4.5)
Glucose: 166 mg/dL — ABNORMAL HIGH (ref 65–99)
HDL: 58 mg/dL (ref >39)
Hematocrit: 41.1 % (ref 34.0–46.6)
Hemoglobin: 13.6 g/dL (ref 11.1–15.9)
Immature Grans (Abs): 0 10*3/uL (ref 0.0–0.1)
Immature Granulocytes: 0 %
Iron: 63 ug/dL (ref 27–159)
LDH: 202 IU/L (ref 119–226)
LDL Chol Calc (NIH): 71 mg/dL (ref 0–99)
Lymphocytes Absolute: 1.6 10*3/uL (ref 0.7–3.1)
Lymphs: 23 %
MCH: 29.8 pg (ref 26.6–33.0)
MCHC: 33.1 g/dL (ref 31.5–35.7)
MCV: 90 fL (ref 79–97)
Monocytes Absolute: 0.5 10*3/uL (ref 0.1–0.9)
Monocytes: 8 %
Neutrophils Absolute: 4.7 10*3/uL (ref 1.4–7.0)
Neutrophils: 66 %
Phosphorus: 4.4 mg/dL — ABNORMAL HIGH (ref 3.0–4.3)
Platelets: 302 10*3/uL (ref 150–450)
Potassium: 4.2 mmol/L (ref 3.5–5.2)
RBC: 4.57 x10E6/uL (ref 3.77–5.28)
RDW: 13.5 % (ref 11.7–15.4)
Sodium: 140 mmol/L (ref 134–144)
T3 Uptake Ratio: 23 % — ABNORMAL LOW (ref 24–39)
T4, Total: 8.4 ug/dL (ref 4.5–12.0)
TSH: 0.593 u[IU]/mL (ref 0.450–4.500)
Total Protein: 6.7 g/dL (ref 6.0–8.5)
Triglycerides: 197 mg/dL — ABNORMAL HIGH (ref 0–149)
Uric Acid: 4.3 mg/dL (ref 3.0–7.2)
VLDL Cholesterol Cal: 32 mg/dL (ref 5–40)
WBC: 7 10*3/uL (ref 3.4–10.8)

## 2019-07-02 LAB — VITAMIN D 25 HYDROXY (VIT D DEFICIENCY, FRACTURES): Vit D, 25-Hydroxy: 47.3 ng/mL (ref 30.0–100.0)

## 2019-07-02 LAB — HGB A1C W/O EAG: Hgb A1c MFr Bld: 6.7 % — ABNORMAL HIGH (ref 4.8–5.6)

## 2019-07-02 LAB — C-REACTIVE PROTEIN: CRP: 2 mg/L (ref 0–10)

## 2019-07-02 LAB — BRAIN NATRIURETIC PEPTIDE

## 2019-07-03 ENCOUNTER — Telehealth: Payer: Self-pay | Admitting: Registered Nurse

## 2019-07-03 ENCOUNTER — Encounter: Payer: Self-pay | Admitting: Registered Nurse

## 2019-07-03 DIAGNOSIS — R21 Rash and other nonspecific skin eruption: Secondary | ICD-10-CM

## 2019-07-03 MED ORDER — PREDNISONE 10 MG PO TABS: ORAL_TABLET | ORAL | 0 refills | Status: AC

## 2019-07-03 NOTE — Telephone Encounter (Signed)
Patient reported her allergy shot was given on Tuesday and denied problems.  Still having rash chest/left shoulder was itchy this morning until her xyzal kicked in.  She hasn't had to take the prednisone.  She stopped benadryl and increased her xyzal to BID less sedation and helping to stop itching.  Rash has not worsened.  Denied facial swelling/dyspnea.  Having some sinus pressure and itchy eyes also.  Thunderstorms in area and barometric pressure changes with cold front expected later today can cause sinus symptoms.  Encouraged nasal saline use frequently while storms in area to help with sinus pressure/allergy symptoms.  May use refresh drops 2 ou TID prn eye itching also.  Encouraged shower after leaving work to get off dust from her body also.  Patient stated she is not feeling great but if she didn't come to work she would lose her holiday pay plus additional 8 hours pay so at work despite URI/sinus symptoms and rash.  Patient verbalized understanding information/instructions, agreed with plan of care and had no further questions at this time.

## 2019-07-08 NOTE — Progress Notes (Signed)
Late entry: Reviewed lab results and notes as above with pt in clinic on 06/30/19 and provided a hard copy of results to pt as well. Pro-BNP was still pending at that time and since cancelled due to not receiving a frozen specimen. Pt had no questions or concerns regarding results or plan of care.

## 2019-07-08 NOTE — Progress Notes (Signed)
noted 

## 2019-08-15 ENCOUNTER — Other Ambulatory Visit: Payer: Self-pay

## 2019-08-15 ENCOUNTER — Ambulatory Visit: Payer: PRIVATE HEALTH INSURANCE | Admitting: Adult Health Nurse Practitioner

## 2019-08-15 VITALS — BP 148/84 | HR 98 | Temp 98.0°F | Ht 71.0 in | Wt 211.6 lb

## 2019-08-15 DIAGNOSIS — F329 Major depressive disorder, single episode, unspecified: Secondary | ICD-10-CM | POA: Diagnosis not present

## 2019-08-15 DIAGNOSIS — F419 Anxiety disorder, unspecified: Secondary | ICD-10-CM

## 2019-08-15 DIAGNOSIS — F32A Depression, unspecified: Secondary | ICD-10-CM

## 2019-08-15 DIAGNOSIS — R5383 Other fatigue: Secondary | ICD-10-CM | POA: Diagnosis not present

## 2019-08-15 MED ORDER — PAROXETINE HCL 30 MG PO TABS: 30.0000 mg | ORAL_TABLET | Freq: Every day | ORAL | 3 refills | Status: DC

## 2019-08-15 NOTE — Patient Instructions (Signed)
° ° ° °  If you have lab work done today you will be contacted with your lab results within the next 2 weeks.  If you have not heard from us then please contact us. The fastest way to get your results is to register for My Chart. ° ° °IF you received an x-ray today, you will receive an invoice from [facility]. Please contact [facility] at [phone] with questions or concerns regarding your invoice.  ° °IF you received labwork today, you will receive an invoice from [facility]. Please contact [facility] at [phone] with questions or concerns regarding your invoice.  ° °Our billing staff will not be able to assist you with questions regarding bills from these companies. ° °You will be contacted with the lab results as soon as they are available. The fastest way to get your results is to activate your My Chart account. Instructions are located on the last page of this paperwork. If you have not heard from us regarding the results in 2 weeks, please contact this office. °  ° ° ° °

## 2019-08-15 NOTE — Progress Notes (Signed)
History   Chief Complaint  Patient presents with  . Follow-up    medical conditions  . score 10    HPI   Kelly Reed returns for follow-up of her post Covid fatigue and malaise.  It is improving.  She still notes that she is very tired by the end of the day.  Does not feel like her endurance is back to normal.  She still has intermittent FMLA but is not taking it some because of being worried about her coworkers.  Denies any shortness of breath.  No chest pain or pressure.  Also feels that her anxiety has increased significantly.  Effexor is not controlling it.  She feels like that she is getting exacerbated by very small things that are happening at work.  Finds herself overreacting.  We discussed medication changes for her anxiety.  She denies any panic attacks.  She has no problem with sleep and sleeps from about 7 PM to 4:30/5 pm  Past Medical History:  Diagnosis Date  . Anemia   . Anxiety   . Asthma   . COVID-19 04/29/2019  . COVID-19 virus detected 04/11/2019  . Diabetes mellitus type II   . Hyperlipidemia   . Hypertension   . NSVD (normal spontaneous vaginal delivery)    x 2   Past Surgical History:  Procedure Laterality Date  . 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 History of Abd. ultrasound  12/04   negative  . Surg. eval lap cholecstectomy  05/28/2006   deferred by patient  . TUBAL LIGATION  1993   bilateral   Family History  Problem Relation Age of Onset  . Breast cancer Mother 77       pt's mother passed away from breast cancer when pt was 54 yo  . Diabetes Maternal Grandmother    Social History   Tobacco Use  . Smoking status: Never Smoker  . Smokeless tobacco: Never Used  Substance Use Topics  . Alcohol use: Yes    Alcohol/week: 1.0 - 2.0 standard drinks    Types: 1 - 2 Standard drinks or equivalent per week    Comment: Occasional beer  . Drug use: No   OB History    Gravida  2   Para  2   Term  2   Preterm      AB      Living   2     SAB      TAB      Ectopic      Multiple      Live Births  2          Review of Systems Review of Systems  Constitutional: Negative for activity change, appetite change, chills and fever.  HENT: Negative for congestion, nosebleeds, trouble swallowing and voice change.   Respiratory: Negative for cough, shortness of breath and wheezing.   Gastrointestinal: Negative for diarrhea, nausea and vomiting.  Genitourinary: Negative for difficulty urinating, dysuria, flank pain and hematuria.  Musculoskeletal: Negative for back pain, joint swelling and neck pain.  Neurological: Negative for dizziness, speech difficulty, light-headedness and numbness.  See HPI. All other review of systems negative.    Allergies   Adhesive [tape] and Prednisone  Home Medications    Current Outpatient Medications:  .  ACCU-CHEK FASTCLIX LANCETS MISC, U UTD TO CHECK BG TID, Disp: , Rfl: 11 .  ACCU-CHEK GUIDE test strip, U UTD TO CHECK BG TID, Disp: , Rfl: 11 .  albuterol (VENTOLIN  HFA) 108 (90 Base) MCG/ACT inhaler, INHALE 2 PUFFS BY MOUTH INTO THE LUNGS EVERY 6 HOURS AS NEEDED WHEEZING OR SHORTNESS OF BREATH, Disp: 18 g, Rfl: 5 .  aspirin EC 81 MG tablet, Take 1 tablet (81 mg total) by mouth daily., Disp: , Rfl:  .  atorvastatin (LIPITOR) 10 MG tablet, Take 10 mg by mouth daily., Disp: , Rfl:  .  Blood Glucose Monitoring Suppl (ACCU-CHEK GUIDE ME) w/Device KIT, U UTD TO CHECK BG TID, Disp: , Rfl: 0 .  Epinastine HCl 0.05 % ophthalmic solution, Place 1 drop into both eyes 2 (two) times daily., Disp: 5 mL, Rfl: 2 .  ferrous sulfate 325 (65 FE) MG tablet, Take 325 mg by mouth every other day. , Disp: , Rfl:  .  levocetirizine (XYZAL) 5 MG tablet, Take 1 tablet by mouth every evening. , Disp: , Rfl: 9 .  losartan (COZAAR) 50 MG tablet, TAKE 1 TABLET BY MOUTH DAILY, Disp: 90 tablet, Rfl: 1 .  MAGNESIUM PO, Take by mouth., Disp: , Rfl:  .  metFORMIN (GLUCOPHAGE) 1000 MG tablet, Take 1 tablet (1,000  mg total) by mouth 2 (two) times daily with a meal., Disp: 180 tablet, Rfl: 0 .  montelukast (SINGULAIR) 10 MG tablet, TAKE 1 TABLET(10 MG) BY MOUTH AT BEDTIME, Disp: 90 tablet, Rfl: 3 .  Multiple Vitamins-Minerals (CENTRUM ADULTS PO), Take by mouth., Disp: , Rfl:  .  sitaGLIPtin (JANUVIA) 100 MG tablet, Take 1 tablet (100 mg total) by mouth daily., Disp: 30 tablet, Rfl: 3 .  sodium chloride (OCEAN) 0.65 % SOLN nasal spray, Place 1 spray into both nostrils every 2 (two) hours while awake., Disp: , Rfl:  .  venlafaxine XR (EFFEXOR-XR) 150 MG 24 hr capsule, Take 1 capsule (150 mg total) by mouth daily with breakfast. Take one capsule daily, Disp: 30 capsule, Rfl: 5 .  vitamin C (ASCORBIC ACID) 500 MG tablet, Take 500 mg by mouth daily., Disp: , Rfl:  .  WIXELA INHUB 250-50 MCG/DOSE AEPB, Inhale 1 puff into the lungs 2 (two) times daily., Disp: , Rfl:  .  Zinc 30 MG TABS, Take by mouth., Disp: , Rfl:  .  fluticasone (FLONASE) 50 MCG/ACT nasal spray, Place 1 spray into both nostrils 2 (two) times daily., Disp: 16 g, Rfl: 6 .  PARoxetine (PAXIL) 30 MG tablet, Take 1 tablet (30 mg total) by mouth daily., Disp: 30 tablet, Rfl: 3  Meds Ordered and Administered this Visit   Meds ordered this encounter  Medications  . PARoxetine (PAXIL) 30 MG tablet    Sig: Take 1 tablet (30 mg total) by mouth daily.    Dispense:  30 tablet    Refill:  3    BP (!) 148/84 (BP Location: Left Arm, Patient Position: Sitting, Cuff Size: Normal)   Pulse 98   Temp 98 F (36.7 C) (Temporal)   Ht 5' 11"  (1.803 m)   Wt 211 lb 9.6 oz (96 kg)   SpO2 96%   BMI 29.51 kg/m   Physical Exam  General appearance: alert, well appearing, and in no distress, oriented to person, place, and time and anxious. Chest: clear to auscultation, no wheezes, rales or rhonchi, symmetric air entry.  CVS exam: normal rate, regular rhythm, normal S1, S2, no murmurs, rubs, clicks or gallops. Skin exam - normal coloration and turgor, no rashes,  no suspicious skin lesions noted. Mental Status: normal mood, behavior, speech, dress, motor activity, and thought processes, anxious.  MDM  1. Fatigue, unspecified type   2. Anxiety and depression    Meds ordered this encounter  Medications  . PARoxetine (PAXIL) 30 MG tablet    Sig: Take 1 tablet (30 mg total) by mouth daily.    Dispense:  30 tablet    Refill:  3   We changed Effexor to Paxil for her anxiety.  Will not need to wean.  Call for any side effects.  She is inline with this plan.   Glyn Ade, NP

## 2019-08-21 ENCOUNTER — Other Ambulatory Visit: Payer: Self-pay

## 2019-08-21 ENCOUNTER — Ambulatory Visit: Payer: Self-pay | Admitting: *Deleted

## 2019-08-21 VITALS — BP 147/93 | HR 95

## 2019-08-21 DIAGNOSIS — R030 Elevated blood-pressure reading, without diagnosis of hypertension: Secondary | ICD-10-CM

## 2019-08-21 NOTE — Progress Notes (Signed)
Pt requests BP check as her pcp has requested a log for the next few months due to her BP being elevated most visits over the past year at least. Pt is planning on ordering a BP cuff for home this weekend.   Reviewed HTN sx with pt and when to seek urgent/emergent care. Pt denies any further questions/concerns.

## 2019-08-26 ENCOUNTER — Telehealth: Payer: Self-pay | Admitting: *Deleted

## 2019-08-26 NOTE — Telephone Encounter (Signed)
RN made aware by HR of pt calling out of work today after leaving sick yesterday.  RN spoke with pt over phone. She reports that she had an asthma flare yesterday while working. 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 She used her albuterol inhaler then and once again last night at home. She has not used it yet today and does not believe she will need to. Feels better today. Loss of taste/smell still present from previous Covid infection. Denies any new or changing sx. No fever/chills/body aches. She sts this feels like her usual asthma flares and plans to return to work tomorrow. HR made aware of same and clearance from clinic given to RTW as scheduled.

## 2019-08-26 NOTE — Telephone Encounter (Signed)
Noted  If not improving please put patient on scheduled for Thursday 08/28/2019 as clinic closed tomorrow 08/27/2019.

## 2019-08-29 NOTE — Telephone Encounter (Signed)
Spoke with pt today at work. She returned to work as planned on 2/17. Reports asthma flare has resolved. Still with mild congestion that started at the same time as the asthma sx on 2/15. She reports this is normal for her with her flares. She feels improved overall and declines f/u appt. Clinic and company was closed on 2/18 due to inclement weather. Advised pt if things change over the weekend, sx return or worsen, contact clinic and appt will be made. She verbalizes understanding and agreement. No further questions/concerns.

## 2019-08-30 ENCOUNTER — Encounter: Payer: Self-pay | Admitting: *Deleted

## 2019-08-30 NOTE — Telephone Encounter (Signed)
Chart review noted patient had follow up with PCM 5 Feb and changed to paxil for anxiety.  Sp02 stable breath sounds clear 96% RA still having fatigue/decreased endurance post covid.

## 2019-08-30 NOTE — Telephone Encounter (Signed)
Noted and agree with plan of care.  Patient improved but will continue to monitor symptoms and follow up prn worsening or new symptoms.  Patient had positive covid test in the previous 90 days.

## 2019-09-02 ENCOUNTER — Other Ambulatory Visit: Payer: Self-pay

## 2019-09-02 ENCOUNTER — Ambulatory Visit: Payer: Self-pay | Admitting: *Deleted

## 2019-09-02 VITALS — BP 135/81 | HR 82

## 2019-09-02 DIAGNOSIS — R030 Elevated blood-pressure reading, without diagnosis of hypertension: Secondary | ICD-10-CM

## 2019-09-02 NOTE — Progress Notes (Signed)
Pt requests BP check for log for pcp.

## 2019-09-05 ENCOUNTER — Ambulatory Visit: Payer: Self-pay | Admitting: *Deleted

## 2019-09-05 ENCOUNTER — Other Ambulatory Visit: Payer: Self-pay

## 2019-09-05 VITALS — BP 140/85 | HR 95

## 2019-09-05 DIAGNOSIS — R030 Elevated blood-pressure reading, without diagnosis of hypertension: Secondary | ICD-10-CM

## 2019-09-05 NOTE — Progress Notes (Signed)
Routine BP check.

## 2019-09-12 ENCOUNTER — Other Ambulatory Visit: Payer: Self-pay | Admitting: Family Medicine

## 2019-09-12 NOTE — Telephone Encounter (Signed)
Requested Prescriptions  Pending Prescriptions Disp Refills  . metFORMIN (GLUCOPHAGE) 1000 MG tablet [Pharmacy Med Name: METFORMIN 1000MG TABLETS] 180 tablet 0    Sig: TAKE 1 TABLET(1000 MG) BY MOUTH TWICE DAILY WITH A MEAL     Endocrinology:  Diabetes - Biguanides Passed - 09/12/2019  5:01 AM      Passed - Cr in normal range and within 360 days    Creat  Date Value Ref Range Status  03/23/2016 0.60 0.50 - 1.05 mg/dL Final    Comment:      For patients > or = 56 years of age: The upper reference limit for Creatinine is approximately 13% higher for people identified as African-American.      Creatinine, Ser  Date Value Ref Range Status  06/26/2019 0.87 0.57 - 1.00 mg/dL Final   Creatinine,U  Date Value Ref Range Status  01/30/2011 17.0 mg/dL Final   Creatinine, Urine  Date Value Ref Range Status  09/16/2015 38 20 - 320 mg/dL Final         Passed - HBA1C is between 0 and 7.9 and within 180 days    Hgb A1c MFr Bld  Date Value Ref Range Status  06/26/2019 6.7 (H) 4.8 - 5.6 % Final    Comment:             Prediabetes: 5.7 - 6.4          Diabetes: >6.4          Glycemic control for adults with diabetes: <7.0          Passed - eGFR in normal range and within 360 days    GFR calc Af Amer  Date Value Ref Range Status  06/26/2019 87 >59 mL/min/1.73 Final   GFR calc non Af Amer  Date Value Ref Range Status  06/26/2019 75 >59 mL/min/1.73 Final   GFR  Date Value Ref Range Status  01/30/2011 116.26 >60.00 mL/min Final         Passed - Valid encounter within last 6 months    Recent Outpatient Visits          4 weeks ago Fatigue, unspecified type   [facility] at [facility], Lorelee Market, NP   4 months ago Acute URI   [facility] at [facility], Lorelee Market, NP   4 months ago Type 2 diabetes mellitus with hyperglycemia, without long-term current use of insulin [facility])   [facility] at [facility], Lilia Argue, MD   4 months ago COVID-19 with comorbid diabetes  mellitus [facility])   [facility] at [facility], Lorelee Market, NP   5 months ago Acute upper respiratory infection   Primary [facility] at [facility], Lorelee Market, NP      Future Appointments            In 1 month Pamella Pert, Lilia Argue, MD Primary [facility] at [place], [facility]

## 2019-09-18 ENCOUNTER — Ambulatory Visit: Payer: Self-pay | Admitting: *Deleted

## 2019-09-18 ENCOUNTER — Other Ambulatory Visit: Payer: Self-pay

## 2019-09-18 VITALS — BP 150/90 | HR 82

## 2019-09-18 DIAGNOSIS — R030 Elevated blood-pressure reading, without diagnosis of hypertension: Secondary | ICD-10-CM

## 2019-09-18 NOTE — Progress Notes (Signed)
Routine BP check for log/management for pcp.

## 2019-09-25 ENCOUNTER — Ambulatory Visit: Payer: Self-pay | Admitting: *Deleted

## 2019-09-25 ENCOUNTER — Other Ambulatory Visit: Payer: Self-pay

## 2019-09-25 VITALS — BP 139/83 | HR 85

## 2019-09-25 DIAGNOSIS — R030 Elevated blood-pressure reading, without diagnosis of hypertension: Secondary | ICD-10-CM

## 2019-09-25 NOTE — Progress Notes (Signed)
Routine BP check for pcp BP log.

## 2019-10-10 ENCOUNTER — Ambulatory Visit: Payer: Self-pay | Admitting: *Deleted

## 2019-10-10 ENCOUNTER — Other Ambulatory Visit: Payer: Self-pay

## 2019-10-10 VITALS — BP 146/95 | HR 98

## 2019-10-10 DIAGNOSIS — R030 Elevated blood-pressure reading, without diagnosis of hypertension: Secondary | ICD-10-CM

## 2019-10-10 NOTE — Progress Notes (Signed)
Routine BP check for pcp log.

## 2019-10-16 ENCOUNTER — Other Ambulatory Visit: Payer: Self-pay

## 2019-10-16 ENCOUNTER — Ambulatory Visit: Payer: Self-pay | Admitting: *Deleted

## 2019-10-16 VITALS — BP 142/93 | HR 89

## 2019-10-16 DIAGNOSIS — R030 Elevated blood-pressure reading, without diagnosis of hypertension: Secondary | ICD-10-CM

## 2019-10-16 NOTE — Progress Notes (Signed)
Routine BP check for pcp log.

## 2019-10-21 ENCOUNTER — Ambulatory Visit: Payer: Self-pay | Admitting: Registered Nurse

## 2019-10-21 ENCOUNTER — Other Ambulatory Visit: Payer: Self-pay

## 2019-10-21 VITALS — BP 164/86 | HR 96 | Temp 99.0°F

## 2019-10-21 DIAGNOSIS — R030 Elevated blood-pressure reading, without diagnosis of hypertension: Secondary | ICD-10-CM

## 2019-10-21 DIAGNOSIS — R1031 Right lower quadrant pain: Secondary | ICD-10-CM

## 2019-10-21 DIAGNOSIS — R42 Dizziness and giddiness: Secondary | ICD-10-CM

## 2019-10-21 DIAGNOSIS — R110 Nausea: Secondary | ICD-10-CM

## 2019-10-21 LAB — GLUCOSE, POCT (MANUAL RESULT ENTRY): POC Glucose: 164 mg/dl — AB (ref 70–99)

## 2019-10-21 MED ORDER — ONDANSETRON 4 MG PO TBDP: 4.0000 mg | ORAL_TABLET | Freq: Three times a day (TID) | ORAL | 0 refills | Status: AC | PRN

## 2019-10-21 NOTE — Progress Notes (Signed)
Subjective:    Patient ID: Kelly Reed, female    DOB: 1966/01/21, 54 y.o.   MRN: [id number]  53y/o Caucasian established female pt c/o RLQ pain stabbing, heartburn, burning sensation from epigastric area upwards, n/v/d, fever x2 days. Temp up to 101, last fever <24hours ago. 75F at qhs. Has noticed slight fullness/ bulge along with pain RLQ/inguinal doesn't feel muscular.  Doesn't worsen with position changes/activity just there.  Normal stool this am.  Urine pale yellow clear and voiding frequently per patient.  Yesterday was able to eat one piece toast, one piece bacon, oatmeal, crackers, peanut butter over several hours throughout day.  Sprite, gatorade, water, and handful dry cereal today. During her workbreak sat in car with airconditioning on and tried to eat.  Didn't take any of her medications this am afraid she would throw them back up.  Losartan typically taken at bedtime.  Not feeling well but didn't want to miss work/be penalized.  Denied vomiting.  Denied known sick contacts.  Hasn't felt completely well since she was sick with covid last year.  Stools watery brown this weekend.  Mother with history of diverticulitis.  She hasn't had her initial colon cancer screening yet overdue.  Denied black or bright red stools.  Dizzy with position changes especially squatting to standing in workcenter today.  Works in Engineer, maintenance at [employer].        Review of Systems  Constitutional: Positive for appetite change, fatigue and fever. Negative for activity change, chills and diaphoresis.  HENT: Negative for trouble swallowing and voice change.   Eyes: Negative for photophobia, pain, discharge, redness, itching and visual disturbance.  Respiratory: Negative for cough, choking, shortness of breath, wheezing and stridor.   Cardiovascular: Negative for chest pain, palpitations and leg swelling.  Gastrointestinal: Positive for abdominal pain, diarrhea and nausea. Negative for abdominal  distention, anal bleeding, blood in stool, constipation, rectal pain and vomiting.  Endocrine: Negative for cold intolerance and heat intolerance.  Genitourinary: Negative for difficulty urinating, dysuria, enuresis and flank pain.  Musculoskeletal: Negative for arthralgias, back pain, gait problem, joint swelling, myalgias, neck pain and neck stiffness.  Skin: Negative for color change, pallor, rash and wound.  Allergic/Immunologic: Positive for environmental allergies. Negative for food allergies.  Neurological: Positive for dizziness and light-headedness. Negative for tremors, seizures, syncope, facial asymmetry, speech difficulty, weakness, numbness and headaches.  Hematological: Negative for adenopathy. Does not bruise/bleed easily.  Psychiatric/Behavioral: Negative for agitation, confusion and sleep disturbance.       Objective:   Physical Exam Vitals and nursing note reviewed.  Constitutional:      General: She is awake. She is not in acute distress.    Appearance: Normal appearance. She is well-developed, well-groomed and overweight. She is not ill-appearing, toxic-appearing or diaphoretic.  HENT:     Head: Normocephalic and atraumatic.     Jaw: There is normal jaw occlusion.     Right Ear: Hearing, tympanic membrane, ear canal and external ear normal.     Left Ear: Hearing, tympanic membrane, ear canal and external ear normal.     Nose: Nose normal.     Mouth/Throat:     Pharynx: Oropharynx is clear.  Eyes:     General: Lids are normal. Vision grossly intact. Gaze aligned appropriately. Allergic shiner present. No visual field deficit or scleral icterus.       Right eye: No discharge.        Left eye: No discharge.     Extraocular Movements:  Extraocular movements intact.     Right eye: Normal extraocular motion and no nystagmus.     Left eye: Normal extraocular motion and no nystagmus.     Conjunctiva/sclera: Conjunctivae normal.     Pupils: Pupils are equal, round, and  reactive to light.  Neck:     Trachea: Trachea normal.  Cardiovascular:     Rate and Rhythm: Normal rate and regular rhythm.     Pulses: Normal pulses.          Radial pulses are 2+ on the right side and 2+ on the left side.     Heart sounds: Normal heart sounds, S1 normal and S2 normal. Heart sounds not distant. No murmur.  Pulmonary:     Effort: Pulmonary effort is normal. No respiratory distress.     Breath sounds: Normal breath sounds and air entry. No stridor, decreased air movement or transmitted upper airway sounds. No wheezing, rhonchi or rales.     Comments: Wearing cloth mask due to covid 19 pandemic; spoke full sentences without difficulty; no cough observed in exam room Abdominal:     General: Abdomen is flat. Bowel sounds are decreased. There is no distension or abdominal bruit. There are no signs of injury.     Palpations: Abdomen is soft. There is no shifting dullness, fluid wave, hepatomegaly, splenomegaly, mass or pulsatile mass.     Tenderness: There is abdominal tenderness in the right lower quadrant. There is no right CVA tenderness, left CVA tenderness, guarding or rebound. Negative signs include Murphy's sign and McBurney's sign.     Hernia: No hernia is present. There is no hernia in the umbilical area, ventral area, left inguinal area, right femoral area, left femoral area or right inguinal area.       Comments: Hypoactive bowel sounds x 4 quads; dull to percussion x 4 quads; mild tenderness RLQ and fullness noted compared to left  Musculoskeletal:        General: No swelling, tenderness, deformity or signs of injury. Normal range of motion.     Right shoulder: Normal.     Left shoulder: Normal.     Right elbow: Normal.     Left elbow: Normal.     Right hand: Normal.     Left hand: Normal.     Cervical back: Normal, normal range of motion and neck supple.     Thoracic back: Normal.     Lumbar back: Normal.     Right hip: Normal.     Left hip: Normal.     Right  knee: Normal.     Left knee: Normal.     Right lower leg: Normal. No edema.     Left lower leg: Normal. No edema.  Lymphadenopathy:     Head:     Right side of head: No submental, submandibular, tonsillar, preauricular or posterior auricular adenopathy.     Left side of head: No submental, submandibular, tonsillar, preauricular or posterior auricular adenopathy.     Cervical: No cervical adenopathy.     Right cervical: No superficial cervical adenopathy.    Left cervical: No superficial cervical adenopathy.  Skin:    General: Skin is warm and dry.     Capillary Refill: Capillary refill takes less than 2 seconds.     Coloration: Skin is not ashen, cyanotic, jaundiced, mottled, pale or sallow.     Findings: No abrasion, abscess, acne, bruising, burn, ecchymosis, erythema, signs of injury, laceration, lesion, petechiae, rash or wound.  Nails: There is no clubbing.  Neurological:     General: No focal deficit present.     Mental Status: She is alert and oriented to person, place, and time.     GCS: GCS eye subscore is 4. GCS verbal subscore is 5. GCS motor subscore is 6.     Cranial Nerves: Cranial nerves are intact. No cranial nerve deficit, dysarthria or facial asymmetry.     Sensory: Sensation is intact. No sensory deficit.     Motor: Motor function is intact. No weakness, tremor, atrophy, abnormal muscle tone or seizure activity.     Coordination: Coordination is intact. Coordination normal.     Gait: Gait is intact. Gait normal.     Comments: Gait sure and steady in clinic; in/out of chair and on/off exam table without difficulty; bilateral hand grasp equal 5/5  Psychiatric:        Attention and Perception: Attention and perception normal.        Mood and Affect: Mood and affect normal. Mood is not anxious or depressed.        Speech: Speech normal.        Behavior: Behavior normal. Behavior is cooperative.        Thought Content: Thought content normal.        Cognition and  Memory: Cognition and memory normal.        Judgment: Judgment normal.           Assessment & Plan:  A-nausea, abdomen pain RLQ, dizziness, elevated blood pressure  P-Electronic Rx zofran 4mg  po BID prn nausea/vomiting #12 RF0 to pharmacy of choice.  Standing to sitting to supine and reversed quickly on exam table without assist today.   I have recommended clear fluids and bland diet.  Avoid dairy/spicy, fried and large portions of meat while having nausea.  If vomiting hold po intake x 1 hour.  Then sips clear fluids like broths, ginger ale, power ade, gatorade, pedialyte may advance to soft/bland if no vomiting x 24 hours and appetite returned otherwise hydration main focus.     Return to the clinic if symptoms persist or worsen; I have alerted the patient to call if high fever, dehydration, marked weakness, fainting, increased abdominal pain, blood in stool or vomit (red or black).   Exitcare handout on nausea/vomiting, abdomen pain printed and given to patient  Discussed signs and symptoms of hernia and diverticulitis with patient also.  I do recommend colonoscopy this year as overdue as she reports mother had polyps and diverticulitis. Holding on antibiotics at this time as stools have returned to normal today  Discussed with patient she is to notify us if diarrhea or worsening abdomen pain/affected area enlarging. Patient verbalized agreement and understanding of treatment plan and had no further questions at this time.  Discussed dizziness could be related to elevated blood pressure or flare of allergies seasonal along with acute illness.  Elevated blood pressure due to pain/not taking her regular medications this am.  Losartan due this evening for daily dosing. Blood sugar elevated with gatorade intake despite low solid food intake.  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  I recommended due to nausea and dizziness to go home and rest today.  Hydrate and bland diet.  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  Discussed ER if chest pain, worst headache of life, dyspnea or visual changes for re-evaluation.  Will see RN Hildred Alamin  within the next week for repeat BP check. Patient verbalized understanding information/instructions, agreed with plan of care and had no further questions at this time.

## 2019-10-21 NOTE — Patient Instructions (Signed)
Nausea, Adult Nausea is the feeling that you have an upset stomach or that you are about to vomit. Nausea on its own is not usually a serious concern, but it may be an early sign of a more serious medical problem. As nausea gets worse, it can lead to vomiting. If vomiting develops, or if you are not able to drink enough fluids, you are at risk of becoming dehydrated. Dehydration can make you tired and thirsty, cause you to have a dry mouth, and decrease how often you urinate. Older adults and people with other diseases or a weak disease-fighting system (immune system) are at higher risk for dehydration. The main goals of treating your nausea are:  To relieve your nausea.  To limit repeated nausea episodes.  To prevent vomiting and dehydration. Follow these instructions at home: Watch your symptoms for any changes. Tell your health care provider about them. Follow these instructions as told by your health care provider. Eating and drinking      Take an oral rehydration solution (ORS). This is a drink that is sold at pharmacies and retail stores.  Drink clear fluids slowly and in small amounts as you are able. Clear fluids include water, ice chips, low-calorie sports drinks, and fruit juice that has water added (diluted fruit juice).  Eat bland, easy-to-digest foods in small amounts as you are able. These foods include bananas, applesauce, rice, lean meats, toast, and crackers.  Avoid drinking fluids that contain a lot of sugar or caffeine, such as energy drinks, sports drinks, and soda.  Avoid alcohol.  Avoid spicy or fatty foods. General instructions  Take over-the-counter and prescription medicines only as told by your health care provider.  Rest at home while you recover.  Drink enough fluid to keep your urine pale yellow.  Breathe slowly and deeply when you feel nauseous.  Avoid smelling things that have strong odors.  Wash your hands often using soap and water. If soap and  water are not available, use hand sanitizer.  Make sure that all people in your household wash their hands well and often.  Keep all follow-up visits as told by your health care provider. This is important. Contact a health care provider if:  Your nausea gets worse.  Your nausea does not go away after two days.  You vomit.  You cannot drink fluids without vomiting.  You have any of the following: ? New symptoms. ? A fever. ? A headache. ? Muscle cramps. ? A rash. ? Pain while urinating.  You feel light-headed or dizzy. Get help right away if:  You have pain in your chest, neck, arm, or jaw.  You feel extremely weak or you faint.  You have vomit that is bright red or looks like coffee grounds.  You have bloody or black stools or stools that look like tar.  You have a severe headache, a stiff neck, or both.  You have severe pain, cramping, or bloating in your abdomen.  You have difficulty breathing or are breathing very quickly.  Your heart is beating very quickly.  Your skin feels cold and clammy.  You feel confused.  You have signs of dehydration, such as: ? Dark urine, very little urine, or no urine. ? Cracked lips. ? Dry mouth. ? Sunken eyes. ? Sleepiness. ? Weakness. These symptoms may represent a serious problem that is an emergency. Do not wait to see if the symptoms will go away. Get medical help right away. Call your local emergency services (911  in the [place] Do not drive yourself to the hospital. Summary  Nausea is the feeling that you have an upset stomach or that you are about to vomit. Nausea on its own is not usually a serious concern, but it may be an early sign of a more serious medical problem.  If vomiting develops, or if you are not able to drink enough fluids, you are at risk of becoming dehydrated.  Follow recommendations for eating and drinking and take over-the-counter and prescription medicines only as told by your health care  provider.  Contact a health care provider right away if your symptoms worsen or you have new symptoms.  Keep all follow-up visits as told by your health care provider. This is important. This information is not intended to replace advice given to you by your health care provider. Make sure you discuss any questions you have with your health care provider. Document Revised: 12/04/2017 Document Reviewed: 12/04/2017 Elsevier Patient Education  [place] Your Hypertension Hypertension is commonly called high blood pressure. This is when the force of your blood pressing against the walls of your arteries is too strong. Arteries are blood vessels that carry blood from your heart throughout your body. Hypertension forces the heart to work harder to pump blood, and may cause the arteries to become narrow or stiff. Having untreated or uncontrolled hypertension can cause heart attack, stroke, kidney disease, and other problems. What are blood pressure readings? A blood pressure reading consists of a higher number over a lower number. Ideally, your blood pressure should be below 120/80. The first ("top") number is called the systolic pressure. It is a measure of the pressure in your arteries as your heart beats. The second ("bottom") number is called the diastolic pressure. It is a measure of the pressure in your arteries as the heart relaxes. What does my blood pressure reading mean? Blood pressure is classified into four stages. Based on your blood pressure reading, your health care provider may use the following stages to determine what type of treatment you need, if any. Systolic pressure and diastolic pressure are measured in a unit called mm Hg. Normal  Systolic pressure: below 123456.  Diastolic pressure: below 80. Elevated  Systolic pressure: [id number].  Diastolic pressure: below 80. Hypertension stage 1  Systolic pressure: 0000000.  Diastolic pressure: [id number]. Hypertension  stage 2  Systolic pressure: [id number] or above.  Diastolic pressure: 90 or above. What health risks are associated with hypertension? Managing your hypertension is an important responsibility. Uncontrolled hypertension can lead to:  A heart attack.  A stroke.  A weakened blood vessel (aneurysm).  Heart failure.  Kidney damage.  Eye damage.  Metabolic syndrome.  Memory and concentration problems. What changes can I make to manage my hypertension? Hypertension can be managed by making lifestyle changes and possibly by taking medicines. Your health care provider will help you make a plan to bring your blood pressure within a normal range. Eating and drinking   Eat a diet that is high in fiber and potassium, and low in salt (sodium), added sugar, and fat. An example eating plan is called the DASH (Dietary Approaches to Stop Hypertension) diet. To eat this way: ? Eat plenty of fresh fruits and vegetables. Try to fill half of your plate at each meal with fruits and vegetables. ? Eat whole grains, such as whole wheat pasta, brown rice, or whole grain bread. Fill about one quarter of your plate with whole grains. ? Eat low-fat diary  products. ? Avoid fatty cuts of meat, processed or cured meats, and poultry with skin. Fill about one quarter of your plate with lean proteins such as fish, chicken without skin, beans, eggs, and tofu. ? Avoid premade and processed foods. These tend to be higher in sodium, added sugar, and fat.  Reduce your daily sodium intake. Most people with hypertension should eat less than 1,500 mg of sodium a day.  Limit alcohol intake to no more than 1 drink a day for nonpregnant women and 2 drinks a day for men. One drink equals 12 oz of beer, 5 oz of wine, or 1 oz of hard liquor. Lifestyle  Work with your health care provider to maintain a healthy body weight, or to lose weight. Ask what an ideal weight is for you.  Get at least 30 minutes of exercise that causes  your heart to beat faster (aerobic exercise) most days of the week. Activities may include walking, swimming, or biking.  Include exercise to strengthen your muscles (resistance exercise), such as weight lifting, as part of your weekly exercise routine. Try to do these types of exercises for 30 minutes at least 3 days a week.  Do not use any products that contain nicotine or tobacco, such as cigarettes and e-cigarettes. If you need help quitting, ask your health care provider.  Control any long-term (chronic) conditions you have, such as high cholesterol or diabetes. Monitoring  Monitor your blood pressure at home as told by your health care provider. Your personal target blood pressure may vary depending on your medical conditions, your age, and other factors.  Have your blood pressure checked regularly, as often as told by your health care provider. Working with your health care provider  Review all the medicines you take with your health care provider because there may be side effects or interactions.  Talk with your health care provider about your diet, exercise habits, and other lifestyle factors that may be contributing to hypertension.  Visit your health care provider regularly. Your health care provider can help you create and adjust your plan for managing hypertension. Will I need medicine to control my blood pressure? Your health care provider may prescribe medicine if lifestyle changes are not enough to get your blood pressure under control, and if:  Your systolic blood pressure is 130 or higher.  Your diastolic blood pressure is 80 or higher. Take medicines only as told by your health care provider. Follow the directions carefully. Blood pressure medicines must be taken as prescribed. The medicine does not work as well when you skip doses. Skipping doses also puts you at risk for problems. Contact a health care provider if:  You think you are having a reaction to medicines you  have taken.  You have repeated (recurrent) headaches.  You feel dizzy.  You have swelling in your ankles.  You have trouble with your vision. Get help right away if:  You develop a severe headache or confusion.  You have unusual weakness or numbness, or you feel faint.  You have severe pain in your chest or abdomen.  You vomit repeatedly.  You have trouble breathing. Summary  Hypertension is when the force of blood pumping through your arteries is too strong. If this condition is not controlled, it may put you at risk for serious complications.  Your personal target blood pressure may vary depending on your medical conditions, your age, and other factors. For most people, a normal blood pressure is less than 120/80.  Hypertension is managed by lifestyle changes, medicines, or both. Lifestyle changes include weight loss, eating a healthy, low-sodium diet, exercising more, and limiting alcohol. This information is not intended to replace advice given to you by your health care provider. Make sure you discuss any questions you have with your health care provider. Document Revised: 10/18/2018 Document Reviewed: 05/24/2016 Elsevier Patient Education  [place]. Dizziness Dizziness is a common problem. It is a feeling of unsteadiness or light-headedness. You may feel like you are about to faint. Dizziness can lead to injury if you stumble or fall. Anyone can become dizzy, but dizziness is more common in older adults. This condition can be caused by a number of things, including medicines, dehydration, or illness. Follow these instructions at home: Eating and drinking  Drink enough fluid to keep your urine clear or pale yellow. This helps to keep you from becoming dehydrated. Try to drink more clear fluids, such as water.  Do not drink alcohol.  Limit your caffeine intake if told to do so by your health care provider. Check ingredients and nutrition facts to see if a food or  beverage contains caffeine.  Limit your salt (sodium) intake if told to do so by your health care provider. Check ingredients and nutrition facts to see if a food or beverage contains sodium. Activity  Avoid making quick movements. ? Rise slowly from chairs and steady yourself until you feel okay. ? In the morning, first sit up on the side of the bed. When you feel okay, stand slowly while you hold onto something until you know that your balance is fine.  If you need to stand in one place for a long time, move your legs often. Tighten and relax the muscles in your legs while you are standing.  Do not drive or use heavy machinery if you feel dizzy.  Avoid bending down if you feel dizzy. Place items in your home so that they are easy for you to reach without leaning over. Lifestyle  Do not use any products that contain nicotine or tobacco, such as cigarettes and e-cigarettes. If you need help quitting, ask your health care provider.  Try to reduce your stress level by using methods such as yoga or meditation. Talk with your health care provider if you need help to manage your stress. General instructions  Watch your dizziness for any changes.  Take over-the-counter and prescription medicines only as told by your health care provider. Talk with your health care provider if you think that your dizziness is caused by a medicine that you are taking.  Tell a friend or a family member that you are feeling dizzy. If he or she notices any changes in your behavior, have this person call your health care provider.  Keep all follow-up visits as told by your health care provider. This is important. Contact a health care provider if:  Your dizziness does not go away.  Your dizziness or light-headedness gets worse.  You feel nauseous.  You have reduced hearing.  You have new symptoms.  You are unsteady on your feet or you feel like the room is spinning. Get help right away if:  You vomit  or have diarrhea and are unable to eat or drink anything.  You have problems talking, walking, swallowing, or using your arms, hands, or legs.  You feel generally weak.  You are not thinking clearly or you have trouble forming sentences. It may take a friend or family member to notice  this.  You have chest pain, abdominal pain, shortness of breath, or sweating.  Your vision changes.  You have any bleeding.  You have a severe headache.  You have neck pain or a stiff neck.  You have a fever. These symptoms may represent a serious problem that is an emergency. Do not wait to see if the symptoms will go away. Get medical help right away. Call your local emergency services (911 in the [place] Do not drive yourself to the hospital. Summary  Dizziness is a feeling of unsteadiness or light-headedness. This condition can be caused by a number of things, including medicines, dehydration, or illness.  Anyone can become dizzy, but dizziness is more common in older adults.  Drink enough fluid to keep your urine clear or pale yellow. Do not drink alcohol.  Avoid making quick movements if you feel dizzy. Monitor your dizziness for any changes. This information is not intended to replace advice given to you by your health care provider. Make sure you discuss any questions you have with your health care provider. Document Revised: 06/29/2017 Document Reviewed: 07/29/2016 Elsevier Patient Education  [place]. Abdominal Pain, Adult Pain in the abdomen (abdominal pain) can be caused by many things. Often, abdominal pain is not serious and it gets better with no treatment or by being treated at home. However, sometimes abdominal pain is serious. Your health care provider will ask questions about your medical history and do a physical exam to try to determine the cause of your abdominal pain. Follow these instructions at home:  Medicines  Take over-the-counter and prescription medicines  only as told by your health care provider.  Do not take a laxative unless told by your health care provider. General instructions  Watch your condition for any changes.  Drink enough fluid to keep your urine pale yellow.  Keep all follow-up visits as told by your health care provider. This is important. Contact a health care provider if:  Your abdominal pain changes or gets worse.  You are not hungry or you lose weight without trying.  You are constipated or have diarrhea for more than 2-3 days.  You have pain when you urinate or have a bowel movement.  Your abdominal pain wakes you up at night.  Your pain gets worse with meals, after eating, or with certain foods.  You are vomiting and cannot keep anything down.  You have a fever.  You have blood in your urine. Get help right away if:  Your pain does not go away as soon as your health care provider told you to expect.  You cannot stop vomiting.  Your pain is only in areas of the abdomen, such as the right side or the left lower portion of the abdomen. Pain on the right side could be caused by appendicitis.  You have bloody or black stools, or stools that look like tar.  You have severe pain, cramping, or bloating in your abdomen.  You have signs of dehydration, such as: ? Dark urine, very little urine, or no urine. ? Cracked lips. ? Dry mouth. ? Sunken eyes. ? Sleepiness. ? Weakness.  You have trouble breathing or chest pain. Summary  Often, abdominal pain is not serious and it gets better with no treatment or by being treated at home. However, sometimes abdominal pain is serious.  Watch your condition for any changes.  Take over-the-counter and prescription medicines only as told by your health care provider.  Contact a health care provider  if your abdominal pain changes or gets worse.  Get help right away if you have severe pain, cramping, or bloating in your abdomen. This information is not intended to  replace advice given to you by your health care provider. Make sure you discuss any questions you have with your health care provider. Document Revised: 11/04/2018 Document Reviewed: 11/04/2018 Elsevier Patient Education  [place]. Diverticulitis  Diverticulitis is infection or inflammation of small pouches (diverticula) in the colon that form due to a condition called diverticulosis. Diverticula can trap stool (feces) and bacteria, causing infection and inflammation. Diverticulitis may cause severe stomach pain and diarrhea. It may lead to tissue damage in the colon that causes bleeding. The diverticula may also burst (rupture) and cause infected stool to enter other areas of the abdomen. Complications of diverticulitis can include:  Bleeding.  Severe infection.  Severe pain.  Rupture (perforation) of the colon.  Blockage (obstruction) of the colon. What are the causes? This condition is caused by stool becoming trapped in the diverticula, which allows bacteria to grow in the diverticula. This leads to inflammation and infection. What increases the risk? You are more likely to develop this condition if:  You have diverticulosis. The risk for diverticulosis increases if: ? You are overweight or obese. ? You use tobacco products. ? You do not get enough exercise.  You eat a diet that does not include enough fiber. High-fiber foods include fruits, vegetables, beans, nuts, and whole grains. What are the signs or symptoms? Symptoms of this condition may include:  Pain and tenderness in the abdomen. The pain is normally located on the left side of the abdomen, but it may occur in other areas.  Fever and chills.  Bloating.  Cramping.  Nausea.  Vomiting.  Changes in bowel routines.  Blood in your stool. How is this diagnosed? This condition is diagnosed based on:  Your medical history.  A physical exam.  Tests to make sure there is nothing else causing your  condition. These tests may include: ? Blood tests. ? Urine tests. ? Imaging tests of the abdomen, including X-rays, ultrasounds, MRIs, or CT scans. How is this treated? Most cases of this condition are mild and can be treated at home. Treatment may include:  Taking over-the-counter pain medicines.  Following a clear liquid diet.  Taking antibiotic medicines by mouth.  Rest. More severe cases may need to be treated at a hospital. Treatment may include:  Not eating or drinking.  Taking prescription pain medicine.  Receiving antibiotic medicines through an IV tube.  Receiving fluids and nutrition through an IV tube.  Surgery. When your condition is under control, your health care provider may recommend that you have a colonoscopy. This is an exam to look at the entire large intestine. During the exam, a lubricated, bendable tube is inserted into the anus and then passed into the rectum, colon, and other parts of the large intestine. A colonoscopy can show how severe your diverticula are and whether something else may be causing your symptoms. Follow these instructions at home: Medicines  Take over-the-counter and prescription medicines only as told by your health care provider. These include fiber supplements, probiotics, and stool softeners.  If you were prescribed an antibiotic medicine, take it as told by your health care provider. Do not stop taking the antibiotic even if you start to feel better.  Do not drive or use heavy machinery while taking prescription pain medicine. General instructions   Follow a full liquid  diet or another diet as directed by your health care provider. After your symptoms improve, your health care provider may tell you to change your diet. He or she may recommend that you eat a diet that contains at least 25 g (25 grams) of fiber daily. Fiber makes it easier to pass stool. Healthy sources of fiber include: ? Berries. One cup contains 4-8 grams of  fiber. ? Beans or lentils. One half cup contains 5-8 grams of fiber. ? Green vegetables. One cup contains 4 grams of fiber.  Exercise for at least 30 minutes, 3 times each week. You should exercise hard enough to raise your heart rate and break a sweat.  Keep all follow-up visits as told by your health care provider. This is important. You may need a colonoscopy. Contact a health care provider if:  Your pain does not improve.  You have a hard time drinking or eating food.  Your bowel movements do not return to normal. Get help right away if:  Your pain gets worse.  Your symptoms do not get better with treatment.  Your symptoms suddenly get worse.  You have a fever.  You vomit more than one time.  You have stools that are bloody, black, or tarry. Summary  Diverticulitis is infection or inflammation of small pouches (diverticula) in the colon that form due to a condition called diverticulosis. Diverticula can trap stool (feces) and bacteria, causing infection and inflammation.  You are at higher risk for this condition if you have diverticulosis and you eat a diet that does not include enough fiber.  Most cases of this condition are mild and can be treated at home. More severe cases may need to be treated at a hospital.  When your condition is under control, your health care provider may recommend that you have an exam called a colonoscopy. This exam can show how severe your diverticula are and whether something else may be causing your symptoms. This information is not intended to replace advice given to you by your health care provider. Make sure you discuss any questions you have with your health care provider. Document Revised: 06/08/2017 Document Reviewed: 07/29/2016 Elsevier Patient Education  2020 [employer].

## 2019-10-23 ENCOUNTER — Telehealth: Payer: Self-pay | Admitting: Registered Nurse

## 2019-10-23 ENCOUNTER — Other Ambulatory Visit: Payer: Self-pay | Admitting: Family Medicine

## 2019-10-23 NOTE — Telephone Encounter (Signed)
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  Met with her in lunch room.  She reported had started to feel a little sweaty prior to lunch and ate half of a protein bar.  Picked up zofran Rx but hadn't needed to take any.  Was not at work yesterday returned today.  Diet back to normal feeling better RLQ pain resolved but still feeling a little fullness in same area I noted during her office visit Tuesday 10/21/2019.  Stools normal denied vomiting.  Had some nausea but not bad enough to take zofran.  Feels well enough to complete her work shift today and denied concerns and no questions for me today.  Continue plan of care as previously discussed.  Noted in epic patient has appt with [facility] 10/31/2019 scheduled.

## 2019-10-31 ENCOUNTER — Other Ambulatory Visit: Payer: Self-pay

## 2019-10-31 ENCOUNTER — Ambulatory Visit: Payer: PRIVATE HEALTH INSURANCE | Admitting: Family Medicine

## 2019-10-31 ENCOUNTER — Encounter: Payer: Self-pay | Admitting: Family Medicine

## 2019-10-31 VITALS — BP 151/87 | HR 97 | Temp 97.6°F | Ht 71.0 in | Wt 217.2 lb

## 2019-10-31 DIAGNOSIS — I10 Essential (primary) hypertension: Secondary | ICD-10-CM | POA: Diagnosis not present

## 2019-10-31 DIAGNOSIS — E7800 Pure hypercholesterolemia, unspecified: Secondary | ICD-10-CM

## 2019-10-31 DIAGNOSIS — F329 Major depressive disorder, single episode, unspecified: Secondary | ICD-10-CM

## 2019-10-31 DIAGNOSIS — R1031 Right lower quadrant pain: Secondary | ICD-10-CM | POA: Diagnosis not present

## 2019-10-31 DIAGNOSIS — E1165 Type 2 diabetes mellitus with hyperglycemia: Secondary | ICD-10-CM

## 2019-10-31 DIAGNOSIS — F419 Anxiety disorder, unspecified: Secondary | ICD-10-CM

## 2019-10-31 DIAGNOSIS — Z862 Personal history of diseases of the blood and blood-forming organs and certain disorders involving the immune mechanism: Secondary | ICD-10-CM

## 2019-10-31 DIAGNOSIS — F32A Depression, unspecified: Secondary | ICD-10-CM

## 2019-10-31 DIAGNOSIS — R112 Nausea with vomiting, unspecified: Secondary | ICD-10-CM

## 2019-10-31 LAB — POCT GLYCOSYLATED HEMOGLOBIN (HGB A1C): Hemoglobin A1C: 6.8 % — AB (ref 4.0–5.6)

## 2019-10-31 LAB — POCT CBC
Granulocyte percent: 72.4 %G (ref 37–80)
HCT, POC: 40.5 % (ref 29–41)
Hemoglobin: 13.9 g/dL (ref 11–14.6)
Lymph, poc: 1.4 (ref 0.6–3.4)
MCH, POC: 30.9 pg (ref 27–31.2)
MCHC: 34.3 g/dL (ref 31.8–35.4)
MCV: 89.9 fL (ref 76–111)
MID (cbc): 0.8 (ref 0–0.9)
MPV: 7.4 fL (ref 0–99.8)
POC Granulocyte: 5.7 (ref 2–6.9)
POC LYMPH PERCENT: 17.4 %L (ref 10–50)
POC MID %: 10.2 %M (ref 0–12)
Platelet Count, POC: 327 10*3/uL (ref 142–424)
RBC: 4.5 M/uL (ref 4.04–5.48)
RDW, POC: 13.6 %
WBC: 7.9 10*3/uL (ref 4.6–10.2)

## 2019-10-31 LAB — LIPID PANEL

## 2019-10-31 MED ORDER — LOSARTAN POTASSIUM 100 MG PO TABS: 100.0000 mg | ORAL_TABLET | Freq: Every day | ORAL | 1 refills | Status: DC

## 2019-10-31 NOTE — Patient Instructions (Signed)
° ° ° °  If you have lab work done today you will be contacted with your lab results within the next 2 weeks.  If you have not heard from us then please contact us. The fastest way to get your results is to register for My Chart. ° ° °IF you received an x-ray today, you will receive an invoice from [facility]. Please contact [facility] at [phone] with questions or concerns regarding your invoice.  ° °IF you received labwork today, you will receive an invoice from [facility]. Please contact [facility] at [phone] with questions or concerns regarding your invoice.  ° °Our billing staff will not be able to assist you with questions regarding bills from these companies. ° °You will be contacted with the lab results as soon as they are available. The fastest way to get your results is to activate your My Chart account. Instructions are located on the last page of this paperwork. If you have not heard from us regarding the results in 2 weeks, please contact this office. °  ° ° ° °

## 2019-10-31 NOTE — Progress Notes (Signed)
4/23/20213:56 PM  Kelly Reed 1966/01/18, 54 y.o., female [id number]  Chief Complaint  Patient presents with  . Follow-up    x60mo on diabetes. Pt has some concerns with BP     HPI:   Patient is a 54y.o. female with past medical history significant for asthma, HTN, DM2, seasonal allergies, HLP,anxietywho presents todayfor routine followup  Last ov oct 2020 - no changes  Has continued to struggle with fatigue since covid Her BP has been running high of recent 2 weeks ago she had GI bug Last Friday had second covid vaccine - had side effects, fever, chills, vomiting and diarrhea, no melena or blood in stool Today no GI sx Has been consistent with her allergies autoimmune  No pelvic pain, vaginal discharge, dysuria or hematuria   [facility] 10/31/2019 08/15/2019 05/09/2019  Decreased Interest 0 0 0  Down, Depressed, Hopeless 0 0 0  PHQ - 2 Score 0 0 0    Fall Risk  10/31/2019 08/15/2019 05/09/2019 05/02/2019 04/29/2019  Falls in the past year? 0 0 0 0 0  Number falls in past yr: 0 0 0 0 0  Injury with Fall? 0 0 0 0 0  Comment - - - - -  Follow up Falls evaluation completed Falls evaluation completed - - -     Allergies  Allergen Reactions  . Adhesive [Tape] Rash and Other (See Comments)    Peeling of skin  . Prednisone     Hyperglycemia with oral steroids    Prior to Admission medications   Medication Sig Start Date End Date Taking? Authorizing Provider  ACCU-CHEK FASTCLIX LANCETS MISC U UTD TO CHECK BG TID 05/11/18  Yes [provider]  ACCU-CHEK GUIDE test strip U UTD TO CHECK BG TID 04/30/18  Yes [provider]  albuterol (VENTOLIN HFA) 108 (90 Base) MCG/ACT inhaler INHALE 2 PUFFS BY MOUTH INTO THE LUNGS EVERY 6 HOURS AS NEEDED WHEEZING OR SHORTNESS OF BREATH 04/29/19  Yes BWendall Mola NP  aspirin EC 81 MG tablet Take 1 tablet (81 mg total) by mouth daily. 08/07/14  Yes SShawnee Knapp MD  atorvastatin (LIPITOR) 10 MG  tablet Take 10 mg by mouth daily. 05/20/19  Yes [provider]  Blood Glucose Monitoring Suppl (ACCU-CHEK GUIDE ME) w/Device KIT U UTD TO CHECK BG TID 05/11/18  Yes [provider]  Epinastine HCl 0.05 % ophthalmic solution Place 1 drop into both eyes 2 (two) times daily. 09/21/16  Yes SShawnee Knapp MD  ferrous sulfate 325 (65 FE) MG tablet Take 325 mg by mouth every other day.    Yes [provider]  JANUVIA 100 MG tablet TAKE 1 TABLET(100 MG) BY MOUTH DAILY 10/23/19  Yes SRutherford Guys MD  levocetirizine (XYZAL) 5 MG tablet Take 1 tablet by mouth every evening.  03/08/17  Yes [provider]  losartan (COZAAR) 50 MG tablet TAKE 1 TABLET BY MOUTH DAILY 05/20/19  Yes SRutherford Guys MD  MAGNESIUM PO Take by mouth.   Yes [provider]  metFORMIN (GLUCOPHAGE) 1000 MG tablet TAKE 1 TABLET(1000 MG) BY MOUTH TWICE DAILY WITH A MEAL 09/12/19  Yes SRutherford Guys MD  montelukast (SINGULAIR) 10 MG tablet TAKE 1 TABLET(10 MG) BY MOUTH AT BEDTIME 04/21/19  Yes SRutherford Guys MD  Multiple Vitamins-Minerals (CENTRUM ADULTS PO) Take by mouth.   Yes [provider]  PARoxetine (PAXIL) 30 MG tablet Take 1 tablet (30 mg total) by  mouth daily. 08/15/19  Yes Wendall Mola, NP  sodium chloride (OCEAN) 0.65 % SOLN nasal spray Place 1 spray into both nostrils every 2 (two) hours while awake. 04/15/19  Yes Betancourt, Aura Fey, NP  venlafaxine XR (EFFEXOR-XR) 150 MG 24 hr capsule Take 1 capsule (150 mg total) by mouth daily with breakfast. Take one capsule daily 01/31/19  Yes Rutherford Guys, MD  vitamin C (ASCORBIC ACID) 500 MG tablet Take 500 mg by mouth daily.   Yes [provider]  Grant Ruts INHUB 250-50 MCG/DOSE AEPB Inhale 1 puff into the lungs 2 (two) times daily. 03/27/19  Yes [provider]  Zinc 30 MG TABS Take by mouth.   Yes [provider]  fluticasone (FLONASE) 50 MCG/ACT nasal spray Place 1 spray into both nostrils 2 (two)  times daily. 04/15/19 10/16/19  BetancourtAura Fey, NP    Past Medical History:  Diagnosis Date  . Anemia   . Anxiety   . Asthma   . COVID-19 04/29/2019  . COVID-19 virus detected 04/11/2019  . Diabetes mellitus type II   . Hyperlipidemia   . Hypertension   . NSVD (normal spontaneous vaginal delivery)    x 2    Past Surgical History:  Procedure Laterality Date  . 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 History of Abd. ultrasound  12/04   negative  . Surg. eval lap cholecstectomy  05/28/2006   deferred by patient  . TUBAL LIGATION  1993   bilateral    Social History   Tobacco Use  . Smoking status: Never Smoker  . Smokeless tobacco: Never Used  Substance Use Topics  . Alcohol use: Yes    Alcohol/week: 1.0 - 2.0 standard drinks    Types: 1 - 2 Standard drinks or equivalent per week    Comment: Occasional beer    Family History  Problem Relation Age of Onset  . Breast cancer Mother 106       pt's mother passed away from breast cancer when pt was 54 yo  . Diabetes Maternal Grandmother     Review of Systems  Respiratory: Negative for cough and shortness of breath.   Cardiovascular: Negative for chest pain, palpitations and leg swelling.   Per hpi  OBJECTIVE:  Today's Vitals   10/31/19 1551 10/31/19 1552  BP: (!) 153/82 (!) 151/87  Pulse: 97   Temp: 97.6 F (36.4 C)   TempSrc: Temporal   SpO2: 97%   Weight: 217 lb 3.2 oz (98.5 kg)   Height: 5' 11"  (1.803 m)    Body mass index is 30.29 kg/m.   Physical Exam Vitals and nursing note reviewed.  Constitutional:      Appearance: She is well-developed.  HENT:     Head: Normocephalic and atraumatic.     Mouth/Throat:     Pharynx: No oropharyngeal exudate.  Eyes:     General: No scleral icterus.    Conjunctiva/sclera: Conjunctivae normal.     Pupils: Pupils are equal, round, and reactive to light.  Cardiovascular:     Rate and Rhythm: Normal rate and regular rhythm.     Heart sounds: Normal  heart sounds. No murmur. No friction rub. No gallop.   Pulmonary:     Effort: Pulmonary effort is normal.     Breath sounds: Normal breath sounds. No wheezing, rhonchi or rales.  Abdominal:     General: Bowel sounds are normal. There is no distension.     Palpations: Abdomen is  soft. There is no hepatomegaly or splenomegaly.     Tenderness: There is abdominal tenderness in the right lower quadrant. There is no guarding or rebound. Negative signs include Rovsing's sign, McBurney's sign, psoas sign and obturator sign.  Musculoskeletal:     Cervical back: Neck supple.  Skin:    General: Skin is warm and dry.  Neurological:     Mental Status: She is alert and oriented to person, place, and time.     Results for orders placed or performed in visit on 10/31/19 (from the past 24 hour(s))  POCT CBC     Status: None   Collection Time: 10/31/19  4:34 PM  Result Value Ref Range   WBC 7.9 4.6 - 10.2 K/uL   Lymph, poc 1.4 0.6 - 3.4   POC LYMPH PERCENT 17.4 10 - 50 %L   MID (cbc) 0.8 0 - 0.9   POC MID % 10.2 0 - 12 %M   POC Granulocyte 5.7 2 - 6.9   Granulocyte percent 72.4 37 - 80 %G   RBC 4.50 4.04 - 5.48 M/uL   Hemoglobin 13.9 11 - 14.6 g/dL   HCT, POC 40.5 29 - 41 %   MCV 89.9 76 - 111 fL   MCH, POC 30.9 27 - 31.2 pg   MCHC 34.3 31.8 - 35.4 g/dL   RDW, POC 13.6 %   Platelet Count, POC 327 142 - 424 K/uL   MPV 7.4 0 - 99.8 fL  POCT A1C     Status: Abnormal   Collection Time: 10/31/19  4:38 PM  Result Value Ref Range   Hemoglobin A1C 6.8 (A) 4.0 - 5.6 %   HbA1c POC (<> result, manual entry)     HbA1c, POC (prediabetic range)     HbA1c, POC (controlled diabetic range)      No results found.   ASSESSMENT and PLAN  1. RLQ abdominal pain 2. Non-intractable vomiting with nausea, unspecified vomiting type In setting of recent GI sx after covid vaccine and VGE. No WBC, low suspicion for appy. Slowly resolving. Continue supportive measures. RTC precautions reviewed.  - POCT CBC -  Lipase  3. Type 2 diabetes mellitus with hyperglycemia, without long-term current use of insulin (HCC) Controlled. Continue current regime.  - POCT A1C  4. Essential hypertension, benign Not controlled, increase losartan 167m.   - CMP14+EGFR  5. Hypercholesteremia Checking labs today, medications will be adjusted as needed.  - Lipid panel  6. Anxiety and depression Overall stable. Mostly affected by recent health factors. Cont current regime.  - TSH  7. History of iron deficiency anemia Consider d/c FeSO4 - Ferritin  Other orders - losartan (COZAAR) 100 MG tablet; Take 1 tablet (100 mg total) by mouth daily. TAKE 1 TABLET BY MOUTH DAILY  Return in about 4 weeks (around 11/28/2019) for HTN - recheck labs.    IRutherford Guys MD [facility] [address]Ph.  [phone] [phone]

## 2019-11-01 LAB — LIPID PANEL
Chol/HDL Ratio: 3.1 ratio (ref 0.0–4.4)
Cholesterol, Total: 171 mg/dL (ref 100–199)
HDL: 55 mg/dL (ref >39)
LDL Chol Calc (NIH): 73 mg/dL (ref 0–99)
Triglycerides: 264 mg/dL — ABNORMAL HIGH (ref 0–149)
VLDL Cholesterol Cal: 43 mg/dL — ABNORMAL HIGH (ref 5–40)

## 2019-11-01 LAB — CMP14+EGFR
ALT: 45 IU/L — ABNORMAL HIGH (ref 0–32)
AST: 28 IU/L (ref 0–40)
Albumin/Globulin Ratio: 2 (ref 1.2–2.2)
Albumin: 4.5 g/dL (ref 3.8–4.9)
Alkaline Phosphatase: 87 IU/L (ref 39–117)
BUN/Creatinine Ratio: 16 (ref 9–23)
BUN: 11 mg/dL (ref 6–24)
Bilirubin Total: 0.2 mg/dL (ref 0.0–1.2)
CO2: 23 mmol/L (ref 20–29)
Calcium: 9.7 mg/dL (ref 8.7–10.2)
Chloride: 102 mmol/L (ref 96–106)
Creatinine, Ser: 0.7 mg/dL (ref 0.57–1.00)
GFR calc Af Amer: 113 mL/min/{1.73_m2} (ref >59)
GFR calc non Af Amer: 98 mL/min/{1.73_m2} (ref >59)
Globulin, Total: 2.3 g/dL (ref 1.5–4.5)
Glucose: 168 mg/dL — ABNORMAL HIGH (ref 65–99)
Potassium: 4.2 mmol/L (ref 3.5–5.2)
Sodium: 140 mmol/L (ref 134–144)
Total Protein: 6.8 g/dL (ref 6.0–8.5)

## 2019-11-01 LAB — LIPASE: Lipase: 118 U/L — ABNORMAL HIGH (ref 14–72)

## 2019-11-01 LAB — TSH: TSH: 0.56 u[IU]/mL (ref 0.450–4.500)

## 2019-11-01 LAB — FERRITIN: Ferritin: 75 ng/mL (ref 15–150)

## 2019-11-11 ENCOUNTER — Other Ambulatory Visit: Payer: Self-pay

## 2019-11-11 ENCOUNTER — Ambulatory Visit: Payer: Self-pay | Admitting: *Deleted

## 2019-11-11 VITALS — BP 136/86 | HR 99

## 2019-11-11 DIAGNOSIS — R030 Elevated blood-pressure reading, without diagnosis of hypertension: Secondary | ICD-10-CM

## 2019-11-11 NOTE — Progress Notes (Signed)
Routine BP check per pt request. Losartan increased on 10/31/19 from 50mg  to 100mg . 8 days out from increased dose start. Plan to recheck in one week. Also has f/u with pcp scheduled 5/21 for HTN and lab recheck.

## 2019-11-18 ENCOUNTER — Ambulatory Visit: Payer: Self-pay | Admitting: Physician Assistant

## 2019-11-18 ENCOUNTER — Encounter: Payer: Self-pay | Admitting: Physician Assistant

## 2019-11-18 ENCOUNTER — Other Ambulatory Visit: Payer: Self-pay

## 2019-11-18 VITALS — BP 134/90 | HR 91 | Temp 99.1°F

## 2019-11-18 DIAGNOSIS — J302 Other seasonal allergic rhinitis: Secondary | ICD-10-CM

## 2019-11-18 NOTE — Progress Notes (Signed)
53y/o Caucasian established female pt c/o frontal and maxillary sinusitis, bilateral otalgia, scratchy throat, mildly productive cough. Endorses chills as well.   Subjective:    Patient ID: Kelly Reed, female    DOB: 10/18/1965, 54 y.o.   MRN: [id number]  HPI  Patient  well known to [facility]. Longstanding asthma , allergies, recurrent  URI. Has commonly used antibiotics and prednisone therapies  Currently 2-3 weeks post Covid vaccine 2-- did well with it Presents at this time reporting intermittent  symptoms over the past 7-10 days- scratchy throat last week-- no longer present; intermittent cough has improved with allergy meds and inhaler, postnasal drip can still be annoying.  Uses montelucast and and fluticasone nasal spray with good effect Has had some popping and squeaking bilateral ears.No pain, No fever documented. Thought she might have had chills last week Also resolved. Reports that she feels "bad" today but cant specifically define-  Tired, no energy but doesn't think she needs to go home. Wishes  brief exam. Mildly anxious that she "doesn't want to catch something" Denies headache , sore throat at this time  Review of Systems As noted above    Objective:   Physical Exam Constitutional:      General: She is not in acute distress.    Appearance: Normal appearance.  HENT:     Head: Normocephalic and atraumatic.     Right Ear: Tympanic membrane, ear canal and external ear normal.     Left Ear: Tympanic membrane, ear canal and external ear normal.     Ears:     Comments: No cerumen, TMs well viz and not retracted, no fluid levels    Nose: Nose normal.     Comments: Mild clear discharge reported intermittent    Mouth/Throat:     Mouth: Mucous membranes are moist.     Pharynx: No oropharyngeal exudate or posterior oropharyngeal erythema.  Eyes:     Extraocular Movements: Extraocular movements intact.  Cardiovascular:     Rate and Rhythm: Normal rate and regular  rhythm.     Pulses: Normal pulses.     Heart sounds: Normal heart sounds.  Pulmonary:     Effort: Pulmonary effort is normal.     Breath sounds: Normal breath sounds.     Comments: Sp02  97% Lungs clear to ausculatation breathing unlabored  Abdominal:     Palpations: Abdomen is soft.     Tenderness: There is no abdominal tenderness.  Musculoskeletal:        General: Normal range of motion.     Cervical back: Normal range of motion and neck supple.  Skin:    General: Skin is warm and dry.     Capillary Refill: Capillary refill takes less than 2 seconds.  Neurological:     Mental Status: She is alert.     Comments: Alert interactive appropriate, mildly anxious        Assessment & Plan:  Exam  today non-specific - discussed random virus and allergy  symptoms still exist in the days of Covid attention Encourage usual interventions for seasonal allergies/PND/non-productive cough.  Increase PO fluids to void every few hours to confirm hydration. Has Robitussin at home- initiate 1-2 tsp every 6 hours to help thin mucous and minimize tendency toward Eustachian tube dysfunction Blow gently not vigorously  As needed Steamy showers or cool mist vaporizer in bedroom Rest , tylenol /ibuprofen  1 plus 1 , or 2  by 2 as desired- do not mix with "combo  cold medications" without carefully checking ingredients Specifically addressed and she feels comfortable in the workplace and doesnt feel need to go home - nor concerned that she might have anything to cause workmate concerns. Reminded that we will be present in clinic this week and she can present as needed to review how she is feeling. RTC PRN.

## 2019-11-25 ENCOUNTER — Ambulatory Visit: Payer: Self-pay | Admitting: *Deleted

## 2019-11-25 ENCOUNTER — Other Ambulatory Visit: Payer: Self-pay

## 2019-11-25 VITALS — BP 134/87

## 2019-11-25 DIAGNOSIS — R030 Elevated blood-pressure reading, without diagnosis of hypertension: Secondary | ICD-10-CM

## 2019-11-25 NOTE — Progress Notes (Signed)
Routine BP check

## 2019-11-28 ENCOUNTER — Ambulatory Visit (INDEPENDENT_AMBULATORY_CARE_PROVIDER_SITE_OTHER): Payer: PRIVATE HEALTH INSURANCE | Admitting: Family Medicine

## 2019-11-28 ENCOUNTER — Other Ambulatory Visit: Payer: Self-pay

## 2019-11-28 ENCOUNTER — Encounter: Payer: Self-pay | Admitting: Family Medicine

## 2019-11-28 VITALS — BP 135/79 | HR 93 | Temp 98.0°F | Ht 71.0 in | Wt 218.4 lb

## 2019-11-28 DIAGNOSIS — I10 Essential (primary) hypertension: Secondary | ICD-10-CM

## 2019-11-28 DIAGNOSIS — R748 Abnormal levels of other serum enzymes: Secondary | ICD-10-CM | POA: Diagnosis not present

## 2019-11-28 DIAGNOSIS — Z1211 Encounter for screening for malignant neoplasm of colon: Secondary | ICD-10-CM | POA: Diagnosis not present

## 2019-11-28 DIAGNOSIS — Z1231 Encounter for screening mammogram for malignant neoplasm of breast: Secondary | ICD-10-CM

## 2019-11-28 DIAGNOSIS — K219 Gastro-esophageal reflux disease without esophagitis: Secondary | ICD-10-CM | POA: Diagnosis not present

## 2019-11-28 MED ORDER — OMEPRAZOLE 20 MG PO CPDR
20.0000 mg | DELAYED_RELEASE_CAPSULE | Freq: Two times a day (BID) | ORAL | 3 refills | Status: DC
Start: 2019-11-28 — End: 2020-05-31

## 2019-11-28 NOTE — Progress Notes (Signed)
5/21/20211:57 PM  Kelly Reed October 27, 1965, 54 y.o., female [id number]  Chief Complaint  Patient presents with  . Hypertension    no trouble with medication increase, monitors bo at wk, results in mychart  . Heartburn    asking for a med that would help with heartburn, no matter what she eats, gives her heartburn    HPI:   Patient is a 54 y.o. female with past medical history significant for asthma, HTN, DM2, seasonal allergies, HLP,anxietywho presents todayfor routine followup  Last OV April 2021 - increase losartan to 152m daily  Tolerating losartan well Last OV lipase 118, on recheck phone call, all GI sx had resolved But patient states that her abd pain has returned with constant indigestion, bloating, mild nausea, no vomiting,  Pain does not radiate Uses tums, rolaids and mylanta - which helps Tried bland diet for several weeks wo resolution No black tarry stools, but reports sulfur smelling stool  Lab Results  Component Value Date   HGBA1C 6.8 (A) 10/31/2019    BP Readings from Last 3 Encounters:  11/25/19 134/87  11/18/19 134/90  11/11/19 136/86   Lab Results  Component Value Date   CREATININE 0.70 10/31/2019   BUN 11 10/31/2019   NA 140 10/31/2019   K 4.2 10/31/2019   CL 102 10/31/2019   CO2 23 10/31/2019   Lab Results  Component Value Date   WBC 7.9 10/31/2019   HGB 13.9 10/31/2019   HCT 40.5 10/31/2019   MCV 89.9 10/31/2019   PLT 302 06/26/2019   Lab Results  Component Value Date   FERRITIN 75 10/31/2019    Depression screen PHQ 2/9 10/31/2019 08/15/2019 05/09/2019  Decreased Interest 0 0 0  Down, Depressed, Hopeless 0 0 0  PHQ - 2 Score 0 0 0    Fall Risk  10/31/2019 08/15/2019 05/09/2019 05/02/2019 04/29/2019  Falls in the past year? 0 0 0 0 0  Number falls in past yr: 0 0 0 0 0  Injury with Fall? 0 0 0 0 0  Comment - - - - -  Follow up Falls evaluation completed Falls evaluation completed - - -     Allergies  Allergen Reactions    . Adhesive [Tape] Rash and Other (See Comments)    Peeling of skin  . Prednisone     Hyperglycemia with oral steroids    Prior to Admission medications   Medication Sig Start Date End Date Taking? Authorizing Provider  ACCU-CHEK FASTCLIX LANCETS MISC U UTD TO CHECK BG TID 05/11/18   [provider]  ACCU-CHEK GUIDE test strip U UTD TO CHECK BG TID 04/30/18   [provider]  albuterol (VENTOLIN HFA) 108 (90 Base) MCG/ACT inhaler INHALE 2 PUFFS BY MOUTH INTO THE LUNGS EVERY 6 HOURS AS NEEDED WHEEZING OR SHORTNESS OF BREATH 04/29/19   BWendall Mola NP  aspirin EC 81 MG tablet Take 1 tablet (81 mg total) by mouth daily. 08/07/14   SShawnee Knapp MD  atorvastatin (LIPITOR) 10 MG tablet Take 10 mg by mouth daily. 05/20/19   [provider]  Blood Glucose Monitoring Suppl (ACCU-CHEK GUIDE ME) w/Device KIT U UTD TO CHECK BG TID 05/11/18   [provider]  Epinastine HCl 0.05 % ophthalmic solution Place 1 drop into both eyes 2 (two) times daily. 09/21/16   SShawnee Knapp MD  ferrous sulfate 325 (65 FE) MG tablet Take 325 mg by mouth every other day.     [provider]  fluticasone (FLONASE) 50 MCG/ACT nasal spray Place 1 spray into both nostrils 2 (two) times daily. 04/15/19 11/18/19  Betancourt, Aura Fey, NP  JANUVIA 100 MG tablet TAKE 1 TABLET(100 MG) BY MOUTH DAILY 10/23/19   Rutherford Guys, MD  levocetirizine (XYZAL) 5 MG tablet Take 1 tablet by mouth every evening.  03/08/17   [provider]  losartan (COZAAR) 100 MG tablet Take 1 tablet (100 mg total) by mouth daily. TAKE 1 TABLET BY MOUTH DAILY 10/31/19   Rutherford Guys, MD  MAGNESIUM PO Take by mouth.    [provider]  metFORMIN (GLUCOPHAGE) 1000 MG tablet TAKE 1 TABLET(1000 MG) BY MOUTH TWICE DAILY WITH A MEAL 09/12/19   Rutherford Guys, MD  montelukast (SINGULAIR) 10 MG tablet TAKE 1 TABLET(10 MG) BY MOUTH AT BEDTIME 04/21/19   Rutherford Guys, MD  Multiple Vitamins-Minerals  (CENTRUM ADULTS PO) Take by mouth.    [provider]  PARoxetine (PAXIL) 30 MG tablet Take 1 tablet (30 mg total) by mouth daily. 08/15/19   Wendall Mola, NP  sodium chloride (OCEAN) 0.65 % SOLN nasal spray Place 1 spray into both nostrils every 2 (two) hours while awake. 04/15/19   Betancourt, Aura Fey, NP  venlafaxine XR (EFFEXOR-XR) 150 MG 24 hr capsule Take 1 capsule (150 mg total) by mouth daily with breakfast. Take one capsule daily 01/31/19   Rutherford Guys, MD  vitamin C (ASCORBIC ACID) 500 MG tablet Take 500 mg by mouth daily.    [provider]  Grant Ruts INHUB 250-50 MCG/DOSE AEPB Inhale 1 puff into the lungs 2 (two) times daily. 03/27/19   [provider]  Zinc 30 MG TABS Take by mouth.    [provider]    Past Medical History:  Diagnosis Date  . Anemia   . Anxiety   . Asthma   . COVID-19 04/29/2019  . COVID-19 virus detected 04/11/2019  . Diabetes mellitus type II   . Hyperlipidemia   . Hypertension   . NSVD (normal spontaneous vaginal delivery)    x 2    Past Surgical History:  Procedure Laterality Date  . 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 History of Abd. ultrasound  12/04   negative  . Surg. eval lap cholecstectomy  05/28/2006   deferred by patient  . TUBAL LIGATION  1993   bilateral    Social History   Tobacco Use  . Smoking status: Never Smoker  . Smokeless tobacco: Never Used  Substance Use Topics  . Alcohol use: Yes    Alcohol/week: 1.0 - 2.0 standard drinks    Types: 1 - 2 Standard drinks or equivalent per week    Comment: Occasional beer    Family History  Problem Relation Age of Onset  . Breast cancer Mother 57       pt's mother passed away from breast cancer when pt was 54 yo  . Diabetes Maternal Grandmother     Review of Systems  Constitutional: Negative for chills and fever.  Respiratory: Negative for cough and shortness of breath.   Cardiovascular: Negative for chest pain,  palpitations and leg swelling.  Gastrointestinal: Positive for abdominal pain, heartburn and nausea. Negative for blood in stool, constipation, diarrhea, melena and vomiting.     OBJECTIVE:  Today's Vitals   11/28/19 1419  BP: 135/79  Pulse: 93  Temp: 98 F (36.7 C)  SpO2: 96%  Weight: 218 lb 6.4 oz (99.1 kg)  Height: 5' 11"  (1.803 m)   Body mass index is 30.46 kg/m.   Physical Exam Vitals and nursing note reviewed.  Constitutional:      Appearance: She is well-developed.  HENT:     Head: Normocephalic and atraumatic.     Mouth/Throat:     Pharynx: No oropharyngeal exudate.  Eyes:     General: No scleral icterus.    Conjunctiva/sclera: Conjunctivae normal.     Pupils: Pupils are equal, round, and reactive to light.  Cardiovascular:     Rate and Rhythm: Normal rate and regular rhythm.     Heart sounds: Normal heart sounds. No murmur. No friction rub. No gallop.   Pulmonary:     Effort: Pulmonary effort is normal.     Breath sounds: Normal breath sounds. No wheezing, rhonchi or rales.  Abdominal:     General: Bowel sounds are normal. There is no distension.     Palpations: Abdomen is soft. There is no hepatomegaly or splenomegaly.     Tenderness: There is abdominal tenderness in the right upper quadrant, right lower quadrant and epigastric area. There is no guarding or rebound. Negative signs include Murphy's sign and McBurney's sign.  Musculoskeletal:     Cervical back: Neck supple.     Right lower leg: No edema.     Left lower leg: No edema.  Skin:    General: Skin is warm and dry.  Neurological:     Mental Status: She is alert and oriented to person, place, and time.        No results found for this or any previous visit (from the past 24 hour(s)).  No results found.   ASSESSMENT and PLAN  1. Gastroesophageal reflux disease, unspecified whether esophagitis present Starting PPI BID, referring to GI - Ambulatory referral to Gastroenterology  2.  Elevated lipase D/c [place] for now. Recheck lipase, if still elevated will order CT scan. RTC precautions given.  - CBC - Lipase - Comprehensive metabolic panel  3. Colon cancer screening - Ambulatory referral to Gastroenterology  4. Visit for screening mammogram - MM Digital Screening; Future  5. Essential hypertension, benign Controlled. Continue current regime.   Other orders - omeprazole (PRILOSEC) 20 MG capsule; Take 1 capsule (20 mg total) by mouth 2 (two) times daily before a meal.  Return for after GI evaluation.    Rutherford Guys, MD Primary Care at [place], [place] [address] Ph.  [phone] Fax [phone]

## 2019-11-29 LAB — CBC
Hematocrit: 40.3 % (ref 34.0–46.6)
Hemoglobin: 13.7 g/dL (ref 11.1–15.9)
MCH: 30.9 pg (ref 26.6–33.0)
MCHC: 34 g/dL (ref 31.5–35.7)
MCV: 91 fL (ref 79–97)
Platelets: 266 10*3/uL (ref 150–450)
RBC: 4.44 x10E6/uL (ref 3.77–5.28)
RDW: 13.1 % (ref 11.7–15.4)
WBC: 6.5 10*3/uL (ref 3.4–10.8)

## 2019-11-29 LAB — COMPREHENSIVE METABOLIC PANEL
ALT: 40 IU/L — ABNORMAL HIGH (ref 0–32)
AST: 27 IU/L (ref 0–40)
Albumin/Globulin Ratio: 2.2 (ref 1.2–2.2)
Albumin: 4.6 g/dL (ref 3.8–4.9)
Alkaline Phosphatase: 84 IU/L (ref 48–121)
BUN/Creatinine Ratio: 13 (ref 9–23)
BUN: 12 mg/dL (ref 6–24)
Bilirubin Total: <0.2 mg/dL (ref 0.0–1.2)
CO2: 23 mmol/L (ref 20–29)
Calcium: 9.5 mg/dL (ref 8.7–10.2)
Chloride: 100 mmol/L (ref 96–106)
Creatinine, Ser: 0.91 mg/dL (ref 0.57–1.00)
GFR calc Af Amer: 82 mL/min/{1.73_m2} (ref >59)
GFR calc non Af Amer: 71 mL/min/{1.73_m2} (ref >59)
Globulin, Total: 2.1 g/dL (ref 1.5–4.5)
Glucose: 204 mg/dL — ABNORMAL HIGH (ref 65–99)
Potassium: 4.3 mmol/L (ref 3.5–5.2)
Sodium: 141 mmol/L (ref 134–144)
Total Protein: 6.7 g/dL (ref 6.0–8.5)

## 2019-11-29 LAB — LIPASE: Lipase: 75 U/L — ABNORMAL HIGH (ref 14–72)

## 2019-12-02 ENCOUNTER — Encounter: Payer: Self-pay | Admitting: Gastroenterology

## 2019-12-13 ENCOUNTER — Other Ambulatory Visit: Payer: Self-pay | Admitting: Family Medicine

## 2019-12-13 NOTE — Telephone Encounter (Signed)
Requested medication (s) are due for refill today: yes  Requested medication (s) are on the active medication list: historic med  Last refill:  06/26/19  Future visit scheduled: no  Notes to clinic:  historical med and provider   Requested Prescriptions  Pending Prescriptions Disp Refills   atorvastatin (LIPITOR) 10 MG tablet [Pharmacy Med Name: ATORVASTATIN 10MG  TABLETS] 90 tablet     Sig: TAKE 1 TABLET(10 MG) BY MOUTH DAILY      Cardiovascular:  Antilipid - Statins Failed - 12/13/2019 10:56 AM      Failed - Triglycerides in normal range and within 360 days    Triglycerides  Date Value Ref Range Status  10/31/2019 264 (H) 0 - 149 mg/dL Final  03/20/2012 140  Final          Passed - Total Cholesterol in normal range and within 360 days    Cholesterol, Total  Date Value Ref Range Status  10/31/2019 171 100 - 199 mg/dL Final  03/20/2012 173  Final          Passed - LDL in normal range and within 360 days    LDL Chol Calc (NIH)  Date Value Ref Range Status  10/31/2019 73 0 - 99 mg/dL Final   Direct LDL  Date Value Ref Range Status  01/30/2011 109.2 mg/dL Final    Comment:    Optimal:  <100 mg/dLNear or Above Optimal:  100-129 mg/dLBorderline High:  130-159 mg/dLHigh:  160-189 mg/dLVery High:  >190 mg/dL          Passed - HDL in normal range and within 360 days    HDL  Date Value Ref Range Status  10/31/2019 55 >39 mg/dL Final          Passed - Patient is not pregnant      Passed - Valid encounter within last 12 months    Recent Outpatient Visits           2 weeks ago Gastroesophageal reflux disease, unspecified whether esophagitis present   [facility], Lilia Argue, MD   1 month ago RLQ abdominal pain   [facility], Lilia Argue, MD   4 months ago Fatigue, unspecified type   [facility] at [facility], Lorelee Market, NP   7 months ago Acute URI   [facility] at [facility], Lorelee Market, NP   7 months ago Type 2 diabetes  mellitus with hyperglycemia, without long-term current use of insulin [facility])   [facility], Lilia Argue, MD

## 2019-12-19 ENCOUNTER — Other Ambulatory Visit: Payer: Self-pay | Admitting: Family Medicine

## 2019-12-23 ENCOUNTER — Other Ambulatory Visit: Payer: Self-pay

## 2019-12-23 ENCOUNTER — Ambulatory Visit: Payer: Self-pay | Admitting: Registered Nurse

## 2019-12-23 VITALS — BP 118/84 | HR 92 | Temp 98.8°F

## 2019-12-23 DIAGNOSIS — H6983 Other specified disorders of Eustachian tube, bilateral: Secondary | ICD-10-CM

## 2019-12-23 DIAGNOSIS — J0111 Acute recurrent frontal sinusitis: Secondary | ICD-10-CM

## 2019-12-23 DIAGNOSIS — H8113 Benign paroxysmal vertigo, bilateral: Secondary | ICD-10-CM

## 2019-12-23 MED ORDER — AZITHROMYCIN 250 MG PO TABS: ORAL_TABLET | ORAL | 0 refills | Status: DC

## 2019-12-23 MED ORDER — PHENYLEPHRINE HCL 5 MG PO TABS: 5.0000 mg | ORAL_TABLET | Freq: Four times a day (QID) | ORAL | Status: AC | PRN

## 2019-12-23 MED ORDER — MECLIZINE HCL 25 MG PO TABS: 25.0000 mg | ORAL_TABLET | Freq: Four times a day (QID) | ORAL | 0 refills | Status: AC | PRN

## 2019-12-23 MED ORDER — ACETAMINOPHEN 500 MG PO TABS: 1000.0000 mg | ORAL_TABLET | Freq: Four times a day (QID) | ORAL | 0 refills | Status: AC | PRN

## 2019-12-23 NOTE — Progress Notes (Signed)
Subjective:    Patient ID: Kelly Reed, female    DOB: 02-21-1966, 54 y.o.   MRN: [id number]  53y/o caucasian female established patient here for evaluation ear pain, sinus pressure, fever 101 took tylenol 2 hours ago, body aches not feeling well fatigued.  Post nasal drip.  Last used nasal saline prior to coming to work.  Still has prednisone at home she didn't use from last sinusitis Dec 2020.  Last azithromycin May 2020 for sinusitis history recurrent frontal and maxillary.  Left teeth pain denied dental problems.  Some dizzyness noted especially with rapid head movements.     Review of Systems  Constitutional: Positive for fatigue and fever. Negative for activity change, appetite change, chills and diaphoresis.  HENT: Positive for congestion, ear pain, postnasal drip, sinus pressure and sinus pain. Negative for dental problem, ear discharge, facial swelling, hearing loss, mouth sores, nosebleeds, tinnitus, trouble swallowing and voice change.   Eyes: Negative for photophobia, pain, discharge, redness, itching and visual disturbance.  Respiratory: Positive for cough. Negative for shortness of breath, wheezing and stridor.   Cardiovascular: Negative for chest pain.  Gastrointestinal: Negative for diarrhea, nausea and vomiting.  Endocrine: Negative for cold intolerance and heat intolerance.  Genitourinary: Negative for difficulty urinating.  Musculoskeletal: Positive for myalgias. Negative for gait problem, joint swelling, neck pain and neck stiffness.  Skin: Negative for rash.  Allergic/Immunologic: Positive for environmental allergies and immunocompromised state. Negative for food allergies.  Neurological: Positive for dizziness, light-headedness and headaches. Negative for tremors, seizures, syncope, facial asymmetry, speech difficulty, weakness and numbness.  Hematological: Negative for adenopathy. Does not bruise/bleed easily.  Psychiatric/Behavioral: Negative for agitation,  confusion and sleep disturbance.       Objective:   Physical Exam Vitals and nursing note reviewed.  Constitutional:      General: She is awake. She is not in acute distress.    Appearance: Normal appearance. She is well-developed, well-groomed and overweight. She is ill-appearing. She is not toxic-appearing or diaphoretic.  HENT:     Head: Normocephalic and atraumatic.     Jaw: There is normal jaw occlusion. No trismus.     Salivary Glands: Right salivary gland is not diffusely enlarged or tender. Left salivary gland is not diffusely enlarged or tender.     Right Ear: Hearing, ear canal and external ear normal. A middle ear effusion is present.     Left Ear: Hearing, ear canal and external ear normal. A middle ear effusion is present.     Nose: Mucosal edema, congestion and rhinorrhea present. No nasal deformity, septal deviation or laceration.     Right Turbinates: Enlarged and swollen. Not pale.     Left Turbinates: Enlarged and swollen. Not pale.     Right Sinus: Maxillary sinus tenderness and frontal sinus tenderness present.     Left Sinus: Maxillary sinus tenderness and frontal sinus tenderness present.     Mouth/Throat:     Lips: Pink. No lesions.     Mouth: Mucous membranes are moist. Mucous membranes are not pale, not dry and not cyanotic. No injury, lacerations, oral lesions or angioedema.     Dentition: No dental tenderness, gingival swelling, dental caries, dental abscesses or gum lesions.     Tongue: No lesions. Tongue does not deviate from midline.     Palate: No mass and lesions.     Pharynx: Uvula midline. Pharyngeal swelling and posterior oropharyngeal erythema present. No oropharyngeal exudate or uvula swelling.     Tonsils: No tonsillar  exudate or tonsillar abscesses.     Comments: Cobblestoning posterior pharynx; bilateral allergic shiners; bilateral TMs air fluid level clear; nasal sniffing noted in exam room; frontal greater than maxillary sinuses TTP bilaterally;  bilateral nasal turbinates edema/erythema clear yellow discharge Eyes:     General: Lids are normal. Vision grossly intact. Gaze aligned appropriately. Allergic shiner present. No visual field deficit or scleral icterus.       Right eye: No foreign body, discharge or hordeolum.        Left eye: No foreign body, discharge or hordeolum.     Extraocular Movements: Extraocular movements intact.     Right eye: Normal extraocular motion and no nystagmus.     Left eye: Normal extraocular motion and no nystagmus.     Conjunctiva/sclera: Conjunctivae normal.     Right eye: Right conjunctiva is not injected. No chemosis, exudate or hemorrhage.    Left eye: Left conjunctiva is not injected. No chemosis, exudate or hemorrhage.    Pupils: Pupils are equal, round, and reactive to light. Pupils are equal.     Right eye: Pupil is round and reactive.     Left eye: Pupil is round and reactive.  Neck:     Thyroid: No thyroid mass or thyromegaly.     Trachea: Trachea normal. No tracheal tenderness or tracheal deviation.  Cardiovascular:     Rate and Rhythm: Normal rate and regular rhythm.     Chest Wall: PMI is not displaced.     Pulses:          Radial pulses are 2+ on the right side and 2+ on the left side.     Heart sounds: Normal heart sounds, S1 normal and S2 normal. No murmur heard.  No friction rub. No gallop.   Pulmonary:     Effort: Pulmonary effort is normal. No accessory muscle usage or respiratory distress.     Breath sounds: Normal breath sounds and air entry. No stridor, decreased air movement or transmitted upper airway sounds. No decreased breath sounds, wheezing, rhonchi or rales.     Comments: No cough observed in exam room; wearing cloth mask due to covid 19 pandemic; spoke full sentences without difficulty Chest:     Chest wall: No tenderness.  Abdominal:     General: Abdomen is flat. There is no distension.     Palpations: Abdomen is soft.  Musculoskeletal:        General: No  tenderness, deformity or signs of injury. Normal range of motion.     Right shoulder: Normal.     Left shoulder: Normal.     Right elbow: Normal.     Left elbow: Normal.     Right hand: Normal.     Left hand: Normal.     Cervical back: Normal, normal range of motion and neck supple. No swelling, edema, deformity, erythema, signs of trauma, lacerations, rigidity, torticollis, tenderness or crepitus. No pain with movement or muscular tenderness. Normal range of motion.     Thoracic back: Normal.     Lumbar back: Normal.     Right hip: Normal.     Left hip: Normal.     Right knee: Normal.     Left knee: Normal.  Lymphadenopathy:     Head:     Right side of head: No submental, submandibular, tonsillar, preauricular, posterior auricular or occipital adenopathy.     Left side of head: No submental, submandibular, tonsillar, preauricular, posterior auricular or occipital adenopathy.  Cervical: No cervical adenopathy.     Right cervical: No superficial, deep or posterior cervical adenopathy.    Left cervical: No superficial, deep or posterior cervical adenopathy.  Skin:    General: Skin is warm and dry.     Capillary Refill: Capillary refill takes less than 2 seconds.     Coloration: Skin is not ashen, cyanotic, jaundiced, mottled, pale or sallow.     Findings: No abrasion, abscess, acne, bruising, burn, ecchymosis, erythema, signs of injury, laceration, lesion, petechiae, rash or wound.     Nails: There is no clubbing.  Neurological:     General: No focal deficit present.     Mental Status: She is alert and oriented to person, place, and time. Mental status is at baseline. She is not disoriented.     GCS: GCS eye subscore is 4. GCS verbal subscore is 5. GCS motor subscore is 6.     Cranial Nerves: Cranial nerves are intact. No cranial nerve deficit, dysarthria or facial asymmetry.     Sensory: Sensation is intact. No sensory deficit.     Motor: Motor function is intact. No weakness,  tremor, atrophy, abnormal muscle tone or seizure activity.     Coordination: Coordination is intact. Coordination normal.     Gait: Gait is intact. Gait normal.     Comments: Gait sure and steady in clinic; on/off exam table and in/out of chair without difficulty; bilateral hand grasp equal  Psychiatric:        Attention and Perception: Attention and perception normal.        Mood and Affect: Mood and affect normal.        Speech: Speech normal.        Behavior: Behavior normal. Behavior is cooperative.        Thought Content: Thought content normal.        Cognition and Memory: Cognition and memory normal.        Judgment: Judgment normal.           Assessment & Plan:  A-acute recurrent frontal sinusitis, BPPV, eustachian tube dysfunction  P-Discussed with patient bilateral otic effusion from eustachian tube dysfunction/postnasal drip probably causing vertigo but could also be age. Meclizine 25mg  po prn helpful in the past. Supportive treatment may take up to 4 doses meclizine per day max 100mg  per 24 hours.  Electronic Rx meclizine 25mg  po QID prn dizzyness #30 RF0 sent to her pharmacy of choice.  Someone to drive her home recommended not driving during vertigo episodes. Follow up if aphasia, dysphasia, visual changes, weakness, fall, worst headache of life, incoordination, fever, ear discharge. Consider ENT evaluation/follow up with PCM if worsening symptoms not controlled with meclizine.  Exitcare handout on benign positional vertigo printed and given to patient.   Patient verbalized understanding of information/agreed with plan of care and had no further questions at this time.   Recommended patient to go home as fever prior to start of shift today, tylenol did help but due to covid pandemic employer does not want employees on site if fever in previous 24 hours.  Discussed for patient to stay home tomorrow.  Email sent to patient supervisor Lianne Moris, [employer]s and coworker  Helmut Muster per patient request to notify them it was recommended by healthcare professional that she go home today and stay home tomorrow.  Discussed if still having fever after 48 hours on azithromycin 250mg  take 2 day 1 by mouth then take 1 by mouth days 2-5 for sinusitis  not helping and still having fever to stay home Thursday 12/25/2019 and contact clinic via telephone #6 RF0 dispensed from [facility] to patient. Patient may use normal saline nasal spray 2 sprays each nostril q2h wa as needed given 1 bottle from clinic stock. Phenylephrine 5-10mg  po QID prn rhinitis 8 UD given to patient from clinic stock and tylenol 1000mg  po QID prn fever/pain 8 UD given to patient from clinic stock   flonase 31mcg 1 spray each nostril BID at home.  Patient denied personal or family history of ENT cancer.  OTC antihistamine of choice xyzal 5mg  po daily prn rhinitis.    Avoid triggers if possible.  Shower prior to bedtime if exposed to triggers.  If allergic dust/dust mites recommend mattress/pillow covers/encasements; washing linens, vacuuming, sweeping, dusting weekly.  Call or return to clinic as needed if these symptoms worsen or fail to improve as anticipated.   Exitcare handout on sinus rinse given to patient.  Patient verbalized understanding of instructions, agreed with plan of care and had no further questions at this time.  P2:  Avoidance and hand washing.  Restart flonase 1 spray each nostril BID, saline 2 sprays each nostril q2h wa prn congestion.  If no improvement with 48 hours of saline and flonase and prednisone use start azithromycin 250mg  take 2 tabs day one then take 1 tab days 2-5 #6 RF0 dispensed from [facility] to patient.   Denied personal or family history of ENT cancer.  Shower BID especially prior to bed. No evidence of systemic bacterial infection, non toxic and well hydrated.  I do not see where any further testing or imaging is necessary at this time.   I will suggest supportive care, rest, good hygiene  and encourage the patient to take adequate fluids.  The patient is to return to clinic or EMERGENCY ROOM if symptoms worsen or change significantly.  Exitcare handout on sinusitis and sinus rinse.  Patient verbalized agreement and understanding of treatment plan and had no further questions at this time.   P2:  Hand washing and cover cough   No evidence of invasive bacterial infection, non toxic and well hydrated.  I do not see where any further testing or imaging is necessary at this time.   I will suggest supportive care, rest, good hygiene and encourage the patient to take adequate fluids.  The patient is to return to clinic or EMERGENCY ROOM if symptoms worsen or change significantly e.g. ear pain, fever, purulent discharge from ears or bleeding.  Exitcare handout on eustachian tube dysfunction printed and given to patient.  Discussed with patient post nasal drip irritates throat/causes swelling blocks eustachian tubes from draining and fluid fills up middle ear.  Bacteria/viruses can grow in fluid and with moving head tube compressed and increases pressure in tube/ear worsening pain.  Studies show will take 30 days for fluid to resolve after post nasal drip controlled with nasal steroid/antihistamine. Antibiotics and steroids do not speed up fluid removal.  Patient verbalized agreement and understanding of treatment plan and had no further questions at this time.

## 2019-12-23 NOTE — Patient Instructions (Signed)
Benign Positional Vertigo Vertigo is the feeling that you or your surroundings are moving when they are not. Benign positional vertigo is the most common form of vertigo. This is usually a harmless condition (benign). This condition is positional. This means that symptoms are triggered by certain movements and positions. This condition can be dangerous if it occurs while you are doing something that could cause harm to you or others. This includes activities such as driving or operating machinery. What are the causes? In many cases, the cause of this condition is not known. It may be caused by a disturbance in an area of the inner ear that helps your brain to sense movement and balance. This disturbance can be caused by:  Viral infection (labyrinthitis).  Head injury.  Repetitive motion, such as jumping, dancing, or running. What increases the risk? You are more likely to develop this condition if:  You are a woman.  You are 50 years of age or older. What are the signs or symptoms? Symptoms of this condition usually happen when you move your head or your eyes in different directions. Symptoms may start suddenly, and usually last for less than a minute. They include:  Loss of balance and falling.  Feeling like you are spinning or moving.  Feeling like your surroundings are spinning or moving.  Nausea and vomiting.  Blurred vision.  Dizziness.  Involuntary eye movement (nystagmus). Symptoms can be mild and cause only minor problems, or they can be severe and interfere with daily life. Episodes of benign positional vertigo may return (recur) over time. Symptoms may improve over time. How is this diagnosed? This condition may be diagnosed based on:  Your medical history.  Physical exam of the head, neck, and ears.  Tests, such as: ? MRI. ? CT scan. ? Eye movement tests. Your health care provider may ask you to change positions quickly while he or she watches you for symptoms  of benign positional vertigo, such as nystagmus. Eye movement may be tested with a variety of exams that are designed to evaluate or stimulate vertigo. ? An electroencephalogram (EEG). This records electrical activity in your brain. ? Hearing tests. You may be referred to a health care provider who specializes in ear, nose, and throat (ENT) problems (otolaryngologist) or a provider who specializes in disorders of the nervous system (neurologist). How is this treated?  This condition may be treated in a session in which your health care provider moves your head in specific positions to adjust your inner ear back to normal. Treatment for this condition may take several sessions. Surgery may be needed in severe cases, but this is rare. In some cases, benign positional vertigo may resolve on its own in 2-4 weeks. Follow these instructions at home: Safety  Move slowly. Avoid sudden body or head movements or certain positions, as told by your health care provider.  Avoid driving until your health care provider says it is safe for you to do so.  Avoid operating heavy machinery until your health care provider says it is safe for you to do so.  Avoid doing any tasks that would be dangerous to you or others if vertigo occurs.  If you have trouble walking or keeping your balance, try using a cane for stability. If you feel dizzy or unstable, sit down right away.  Return to your normal activities as told by your health care provider. Ask your health care provider what activities are safe for you. General instructions  Take over-the-counter   and prescription medicines only as told by your health care provider.  Drink enough fluid to keep your urine pale yellow.  Keep all follow-up visits as told by your health care provider. This is important. Contact a health care provider if:  You have a fever.  Your condition gets worse or you develop new symptoms.  Your family or friends notice any  behavioral changes.  You have nausea or vomiting that gets worse.  You have numbness or a "pins and needles" sensation. Get help right away if you:  Have difficulty speaking or moving.  Are always dizzy.  Faint.  Develop severe headaches.  Have weakness in your legs or arms.  Have changes in your hearing or vision.  Develop a stiff neck.  Develop sensitivity to light. Summary  Vertigo is the feeling that you or your surroundings are moving when they are not. Benign positional vertigo is the most common form of vertigo.  The cause of this condition is not known. It may be caused by a disturbance in an area of the inner ear that helps your brain to sense movement and balance.  Symptoms include loss of balance and falling, feeling that you or your surroundings are moving, nausea and vomiting, and blurred vision.  This condition can be diagnosed based on symptoms, physical exam, and other tests, such as MRI, CT scan, eye movement tests, and hearing tests.  Follow safety instructions as told by your health care provider. You will also be told when to contact your health care provider in case of problems. This information is not intended to replace advice given to you by your health care provider. Make sure you discuss any questions you have with your health care provider. Document Revised: 12/05/2017 Document Reviewed: 12/05/2017 Elsevier Patient Education  [place]. Eustachian Tube Dysfunction  Eustachian tube dysfunction refers to a condition in which a blockage develops in the narrow passage that connects the middle ear to the back of the nose (eustachian tube). The eustachian tube regulates air pressure in the middle ear by letting air move between the ear and nose. It also helps to drain fluid from the middle ear space. Eustachian tube dysfunction can affect one or both ears. When the eustachian tube does not function properly, air pressure, fluid, or both can build  up in the middle ear. What are the causes? This condition occurs when the eustachian tube becomes blocked or cannot open normally. Common causes of this condition include:  Ear infections.  Colds and other infections that affect the nose, mouth, and throat (upper respiratory tract).  Allergies.  Irritation from cigarette smoke.  Irritation from stomach acid coming up into the esophagus (gastroesophageal reflux). The esophagus is the tube that carries food from the mouth to the stomach.  Sudden changes in air pressure, such as from descending in an airplane or scuba diving.  Abnormal growths in the nose or throat, such as: ? Growths that line the nose (nasal polyps). ? Abnormal growth of cells (tumors). ? Enlarged tissue at the back of the throat (adenoids). What increases the risk? You are more likely to develop this condition if:  You smoke.  You are overweight.  You are a child who has: ? Certain birth defects of the mouth, such as cleft palate. ? Large tonsils or adenoids. What are the signs or symptoms? Common symptoms of this condition include:  A feeling of fullness in the ear.  Ear pain.  Clicking or popping noises in the ear.  Ringing in the ear.  Hearing loss.  Loss of balance.  Dizziness. Symptoms may get worse when the air pressure around you changes, such as when you travel to an area of high elevation, fly on an airplane, or go scuba diving. How is this diagnosed? This condition may be diagnosed based on:  Your symptoms.  A physical exam of your ears, nose, and throat.  Tests, such as those that measure: ? The movement of your eardrum (tympanogram). ? Your hearing (audiometry). How is this treated? Treatment depends on the cause and severity of your condition.  In mild cases, you may relieve your symptoms by moving air into your ears. This is called "popping the ears."  In more severe cases, or if you have symptoms of fluid in your ears,  treatment may include: ? Medicines to relieve congestion (decongestants). ? Medicines that treat allergies (antihistamines). ? Nasal sprays or ear drops that contain medicines that reduce swelling (steroids). ? A procedure to drain the fluid in your eardrum (myringotomy). In this procedure, a small tube is placed in the eardrum to:  Drain the fluid.  Restore the air in the middle ear space. ? A procedure to insert a balloon device through the nose to inflate the opening of the eustachian tube (balloon dilation). Follow these instructions at home: Lifestyle  Do not do any of the following until your health care provider approves: ? Travel to high altitudes. ? Fly in airplanes. ? Work in a [employer] or room. ? Scuba dive.  Do not use any products that contain nicotine or tobacco, such as cigarettes and e-cigarettes. If you need help quitting, ask your health care provider.  Keep your ears dry. Wear fitted earplugs during showering and bathing. Dry your ears completely after. General instructions  Take over-the-counter and prescription medicines only as told by your health care provider.  Use techniques to help pop your ears as recommended by your health care provider. These may include: ? Chewing gum. ? Yawning. ? Frequent, forceful swallowing. ? Closing your mouth, holding your nose closed, and gently blowing as if you are trying to blow air out of your nose.  Keep all follow-up visits as told by your health care provider. This is important. Contact a health care provider if:  Your symptoms do not go away after treatment.  Your symptoms come back after treatment.  You are unable to pop your ears.  You have: ? A fever. ? Pain in your ear. ? Pain in your head or neck. ? Fluid draining from your ear.  Your hearing suddenly changes.  You become very dizzy.  You lose your balance. Summary  Eustachian tube dysfunction refers to a condition in which a blockage  develops in the eustachian tube.  It can be caused by ear infections, allergies, inhaled irritants, or abnormal growths in the nose or throat.  Symptoms include ear pain, hearing loss, or ringing in the ears.  Mild cases are treated with maneuvers to unblock the ears, such as yawning or ear popping.  Severe cases are treated with medicines. Surgery may also be done (rare). This information is not intended to replace advice given to you by your health care provider. Make sure you discuss any questions you have with your health care provider. Document Revised: 10/16/2017 Document Reviewed: 10/16/2017 Elsevier Patient Education  [place]. Nonallergic Rhinitis Nonallergic rhinitis is a condition that causes symptoms that affect the nose, such as a runny nose and a stuffed-up nose (nasal  congestion) that can make it hard to breathe through the nose. This condition is different from having an allergy (allergic rhinitis). Allergic rhinitis occurs when the body's defense system (immune system) reacts to a substance that you are allergic to (allergen), such as pollen, pet dander, mold, or dust. Nonallergic rhinitis has many similar symptoms, but it is not caused by allergens. Nonallergic rhinitis can be a short-term or long-term problem. What are the causes? This condition can be caused by many different things. Some common types of nonallergic rhinitis include: Infectious rhinitis  This is usually due to an infection in the upper respiratory tract. Vasomotor rhinitis  This is the most common type of long-term nonallergic rhinitis.  It is caused by too much blood flow through the nose, which makes the tissue inside of the nose swell.  Symptoms are often triggered by strong odors, cold air, stress, drinking alcohol, cigarette smoke, or changes in the weather. Occupational rhinitis  This type is caused by triggers in the workplace, such as chemicals, dusts, animal dander, or air  pollution. Hormonal rhinitis  This type occurs in women as a result of an increase in the female hormone estrogen.  It may occur during pregnancy, puberty, and menstrual cycles.  Symptoms improve when estrogen levels drop. Drug-induced rhinitis Several drugs can cause nonallergic rhinitis, including:  Medicines that are used to treat high blood pressure, heart disease, and Parkinson disease.  Aspirin and NSAIDs.  Over-the-counter nasal decongestant sprays. These can cause a type of nonallergic rhinitis (rhinitis medicamentosa) when they are used for more than a few days. Nonallergic rhinitis with eosinophilia syndrome (NARES)  This type is caused by having too much of a certain type of white blood cell (eosinophil). Nonallergic rhinitis can also be caused by a reaction to eating hot or spicy foods. This does not usually cause long-term symptoms. In some cases, the cause of nonallergic rhinitis is not known. What increases the risk? You are more likely to develop this condition if:  You are 30-60 years of age.  You are a woman. Women are twice as likely to have this condition. What are the signs or symptoms? Common symptoms of this condition include:  Nasal congestion.  Runny nose.  The feeling of mucus going down the back of the throat (postnasal drip).  Trouble sleeping at night and daytime sleepiness. Less common symptoms include:  Sneezing.  Coughing.  Itchy nose.  Bloodshot eyes. How is this diagnosed? This condition may be diagnosed based on:  Your symptoms and medical history.  A physical exam.  Allergy testing to rule out allergic rhinitis. You may have skin tests or blood tests. In some cases, the health care provider may take a swab of nasal secretions to look for an increased number of eosinophils. This would be done to confirm a diagnosis of NARES. How is this treated? Treatment for this condition depends on the cause. No single treatment works for  everyone. Work with your health care provider to find the best treatment for you. Treatment may include:  Avoiding the things that trigger your symptoms.  Using medicines to relieve congestion, such as: ? Steroid nasal spray. There are many types. You may need to try a few to find out which one works best. ? Decongestant medicine. This may be an oral medicine or a nasal spray. These medicines are only used for a short time.  Using medicines to relieve a runny nose. These may include antihistamine medicines or anticholinergic nasal sprays.  Surgery to  remove tissue from inside the nose may be needed in severe cases if the condition has not improved after 6-12 months of medical treatment. Follow these instructions at home:  Take or use over-the-counter and prescription medicines only as told by your health care provider. Do not stop using your medicine even if you start to feel better.  Use salt-water (saline) rinses or other solutions (nasal washes or irrigations) to wash or rinse out the inside of your nose as told by your health care provider.  Do not take NSAIDs or medicines that contain aspirin if they make your symptoms worse.  Do not drink alcohol if it makes your symptoms worse.  Do not use any tobacco products, such as cigarettes, chewing tobacco, and e-cigarettes. If you need help quitting, ask your health care provider.  Avoid secondhand smoke.  Get some exercise every day. Exercise may help reduce symptoms of nonallergic rhinitis for some people. Ask your health care provider how much exercise and what types of exercise are safe for you.  Sleep with the head of your bed raised (elevated). This may reduce nighttime nasal congestion.  Keep all follow-up visits as told by your health care provider. This is important. Contact a health care provider if:  You have a fever.  Your symptoms are getting worse at home.  Your symptoms are not responding to medicine.  You develop  new symptoms, especially a headache or nosebleed. This information is not intended to replace advice given to you by your health care provider. Make sure you discuss any questions you have with your health care provider. Document Revised: 06/08/2017 Document Reviewed: 09/16/2015 Elsevier Patient Education  [place]. How to Perform a Sinus Rinse A sinus rinse is a home treatment that is used to rinse your sinuses with a sterile mixture of salt and water (saline solution). Sinuses are air-filled spaces in your skull behind the bones of your face and forehead that open into your nasal cavity. A sinus rinse can help to clear mucus, dirt, dust, or pollen from your nasal cavity. You may do a sinus rinse when you have a cold, a virus, nasal allergy symptoms, a sinus infection, or stuffiness in your nose or sinuses. Talk with your health care provider about whether a sinus rinse might help you. What are the risks? A sinus rinse is generally safe and effective. However, there are a few risks, which include:  A burning sensation in your sinuses. This may happen if you do not make the saline solution as directed. Be sure to follow all directions when making the saline solution.  Nasal irritation.  Infection from contaminated water. This is rare, but possible. Do not do a sinus rinse if you have had ear or nasal surgery, ear infection, or blocked ears. Supplies needed:  Saline solution or powder.  Distilled or sterile water may be needed to mix with saline powder. ? You may use boiled and cooled tap water. Boil tap water for 5 minutes; cool until it is lukewarm. Use within 24 hours. ? Do not use regular tap water to mix with the saline solution.  Neti pot or nasal rinse bottle. These supplies release the saline solution into your nose and through your sinuses. Neti pots and nasal rinse bottles can be purchased at [employer], a health food store, or online. How to perform a sinus  rinse  1. Wash your hands with soap and water. 2. Wash your device according to the directions that came with  the product and then dry it. 3. Use the solution that comes with your product or one that is sold separately in stores. Follow the mixing directions on the package if you need to mix with sterile or distilled water. 4. Fill the device with the amount of saline solution noted in the device instructions. 5. Stand over a sink and tilt your head sideways over the sink. 6. Place the spout of the device in your upper nostril (the one closer to the ceiling). 7. Gently pour or squeeze the saline solution into your nasal cavity. The liquid should drain out from the lower nostril if you are not too congested. 8. While rinsing, breathe through your open mouth. 9. Gently blow your nose to clear any mucus and rinse solution. Blowing too hard may cause ear pain. 10. Repeat in your other nostril. 11. Clean and rinse your device with clean water and then air-dry it. Talk with your health care provider or pharmacist if you have questions about how to do a sinus rinse. Summary  A sinus rinse is a home treatment that is used to rinse your sinuses with a sterile mixture of salt and water (saline solution).  A sinus rinse is generally safe and effective. Follow all instructions carefully.  Before doing a sinus rinse, talk with your health care provider about whether it would be helpful for you. This information is not intended to replace advice given to you by your health care provider. Make sure you discuss any questions you have with your health care provider. Document Revised: 04/23/2017 Document Reviewed: 04/23/2017 Elsevier Patient Education  [place]. Sinusitis, Adult Sinusitis is inflammation of your sinuses. Sinuses are hollow spaces in the bones around your face. Your sinuses are located:  Around your eyes.  In the middle of your forehead.  Behind your nose.  In your  cheekbones. Mucus normally drains out of your sinuses. When your nasal tissues become inflamed or swollen, mucus can become trapped or blocked. This allows bacteria, viruses, and fungi to grow, which leads to infection. Most infections of the sinuses are caused by a virus. Sinusitis can develop quickly. It can last for up to 4 weeks (acute) or for more than 12 weeks (chronic). Sinusitis often develops after a cold. What are the causes? This condition is caused by anything that creates swelling in the sinuses or stops mucus from draining. This includes:  Allergies.  Asthma.  Infection from bacteria or viruses.  Deformities or blockages in your nose or sinuses.  Abnormal growths in the nose (nasal polyps).  Pollutants, such as chemicals or irritants in the air.  Infection from fungi (rare). What increases the risk? You are more likely to develop this condition if you:  Have a weak body defense system (immune system).  Do a lot of swimming or diving.  Overuse nasal sprays.  Smoke. What are the signs or symptoms? The main symptoms of this condition are pain and a feeling of pressure around the affected sinuses. Other symptoms include:  Stuffy nose or congestion.  Thick drainage from your nose.  Swelling and warmth over the affected sinuses.  Headache.  Upper toothache.  A cough that may get worse at night.  Extra mucus that collects in the throat or the back of the nose (postnasal drip).  Decreased sense of smell and taste.  Fatigue.  A fever.  Sore throat.  Bad breath. How is this diagnosed? This condition is diagnosed based on:  Your symptoms.  Your medical  history.  A physical exam.  Tests to find out if your condition is acute or chronic. This may include: ? Checking your nose for nasal polyps. ? Viewing your sinuses using a device that has a light (endoscope). ? Testing for allergies or bacteria. ? Imaging tests, such as an MRI or CT scan. In rare  cases, a bone biopsy may be done to rule out more serious types of fungal sinus disease. How is this treated? Treatment for sinusitis depends on the cause and whether your condition is chronic or acute.  If caused by a virus, your symptoms should go away on their own within 10 days. You may be given medicines to relieve symptoms. They include: ? Medicines that shrink swollen nasal passages (topical intranasal decongestants). ? Medicines that treat allergies (antihistamines). ? A spray that eases inflammation of the nostrils (topical intranasal corticosteroids). ? Rinses that help get rid of thick mucus in your nose (nasal saline washes).  If caused by bacteria, your health care provider may recommend waiting to see if your symptoms improve. Most bacterial infections will get better without antibiotic medicine. You may be given antibiotics if you have: ? A severe infection. ? A weak immune system.  If caused by narrow nasal passages or nasal polyps, you may need to have surgery. Follow these instructions at home: Medicines  Take, use, or apply over-the-counter and prescription medicines only as told by your health care provider. These may include nasal sprays.  If you were prescribed an antibiotic medicine, take it as told by your health care provider. Do not stop taking the antibiotic even if you start to feel better. Hydrate and humidify   Drink enough fluid to keep your urine pale yellow. Staying hydrated will help to thin your mucus.  Use a cool mist humidifier to keep the humidity level in your home above 50%.  Inhale steam for 10-15 minutes, 3-4 times a day, or as told by your health care provider. You can do this in the bathroom while a hot shower is running.  Limit your exposure to cool or dry air. Rest  Rest as much as possible.  Sleep with your head raised (elevated).  Make sure you get enough sleep each night. General instructions   Apply a warm, moist washcloth to  your face 3-4 times a day or as told by your health care provider. This will help with discomfort.  Wash your hands often with soap and water to reduce your exposure to germs. If soap and water are not available, use hand sanitizer.  Do not smoke. Avoid being around people who are smoking (secondhand smoke).  Keep all follow-up visits as told by your health care provider. This is important. Contact a health care provider if:  You have a fever.  Your symptoms get worse.  Your symptoms do not improve within 10 days. Get help right away if:  You have a severe headache.  You have persistent vomiting.  You have severe pain or swelling around your face or eyes.  You have vision problems.  You develop confusion.  Your neck is stiff.  You have trouble breathing. Summary  Sinusitis is soreness and inflammation of your sinuses. Sinuses are hollow spaces in the bones around your face.  This condition is caused by nasal tissues that become inflamed or swollen. The swelling traps or blocks the flow of mucus. This allows bacteria, viruses, and fungi to grow, which leads to infection.  If you were prescribed an antibiotic medicine,  take it as told by your health care provider. Do not stop taking the antibiotic even if you start to feel better.  Keep all follow-up visits as told by your health care provider. This is important. This information is not intended to replace advice given to you by your health care provider. Make sure you discuss any questions you have with your health care provider. Document Revised: 11/26/2017 Document Reviewed: 11/26/2017 Elsevier Patient Education  [employer].

## 2019-12-23 NOTE — Progress Notes (Signed)
   Subjective:    Patient ID: Kelly Reed, female    DOB: 12/11/1965, 54 y.o.   MRN: [id number]  53y/o Caucasian established female pt c/o L otalgia, sinus pain/pressure frontal and maxillary, PND with cough and chest congestion, generalized body aches since yesterday. Also started running a fever overnight. Was 101.84f at 0300. Last dose Tylenol 0700 this morning. Endorses dizziness and feeling thirsty. Denies wheezing. Has not needed rescue inhaler recently.      Review of Systems     Objective:   Physical Exam        Assessment & Plan:

## 2019-12-25 ENCOUNTER — Encounter: Payer: Self-pay | Admitting: Registered Nurse

## 2019-12-25 ENCOUNTER — Telehealth: Payer: Self-pay | Admitting: Registered Nurse

## 2019-12-25 NOTE — Telephone Encounter (Signed)
Patient reported Tmax 99.9 yesterday am.  Today headache, fatigue and mild body aches.  Post nasal drip and cough improved but not resolved.  Has been taking azithromycin.  Hasn't required rescue inhaler/albuterol use.  Has stayed inside resting at home since last clinic visit.  Woke up with headache today.  Left ear pain decreasing and finally popped.  Called out sick today.  Slept in today. Had tylenol at 0600 for headache temp at 1200 when she woke up 98.8.  Discussed with patient if fever continuing or no improvement in symptoms to stay home tomorrow and call RN Hildred Alamin to schedule covid testing/give her symptom update tomorrow at extension 2044.  Patient verbalized understanding information/instructions, agreed with plan of care and had no further questions at this time.

## 2019-12-26 ENCOUNTER — Other Ambulatory Visit: Payer: Self-pay

## 2019-12-26 DIAGNOSIS — F419 Anxiety disorder, unspecified: Secondary | ICD-10-CM

## 2019-12-26 MED ORDER — PAROXETINE HCL 30 MG PO TABS: 30.0000 mg | ORAL_TABLET | Freq: Every day | ORAL | 2 refills | Status: DC

## 2019-12-30 ENCOUNTER — Telehealth: Payer: Self-pay | Admitting: *Deleted

## 2019-12-31 ENCOUNTER — Ambulatory Visit (AMBULATORY_SURGERY_CENTER): Payer: Self-pay | Admitting: *Deleted

## 2019-12-31 ENCOUNTER — Other Ambulatory Visit: Payer: Self-pay

## 2019-12-31 VITALS — Ht 71.0 in | Wt 218.6 lb

## 2019-12-31 DIAGNOSIS — Z1211 Encounter for screening for malignant neoplasm of colon: Secondary | ICD-10-CM

## 2019-12-31 MED ORDER — SUTAB 1479-225-188 MG PO TABS: 1.0000 | ORAL_TABLET | Freq: Once | ORAL | 0 refills | Status: AC

## 2019-12-31 NOTE — Progress Notes (Signed)
2nd dose of covid vaccine 10-24-19  Pt is aware that care partner will wait in the car during procedure; if they feel like they will be too hot or cold to wait in the car; they may wait in the 4 th floor lobby. Patient is aware to bring only one care partner. We want them to wear a mask (we do not have any that we can provide them), practice social distancing, and we will check their temperatures when they get here.  I did remind the patient that their care partner needs to stay in the parking lot the entire time and have a cell phone available, we will call them when the pt is ready for discharge. Patient will wear mask into building.  No trouble with anesthesia per pt, no trouble moving neck or fam hx of malignant hyperthermia   No egg or soy allergy  No home oxygen use   No medications for weight loss taken  emmi information given  Pt has constipation issues- 2 day Sutab/Miralax prep given   Sutab code put into RX and paper copy given to pt to show pharmacy

## 2020-01-01 ENCOUNTER — Encounter: Payer: Self-pay | Admitting: Gastroenterology

## 2020-01-02 NOTE — Telephone Encounter (Signed)
error 

## 2020-01-09 ENCOUNTER — Other Ambulatory Visit: Payer: Self-pay

## 2020-01-09 ENCOUNTER — Encounter: Payer: Self-pay | Admitting: Gastroenterology

## 2020-01-09 ENCOUNTER — Ambulatory Visit (AMBULATORY_SURGERY_CENTER): Payer: No Typology Code available for payment source | Admitting: Gastroenterology

## 2020-01-09 VITALS — BP 144/87 | HR 71 | Temp 97.5°F | Resp 13 | Ht 71.0 in | Wt 218.0 lb

## 2020-01-09 DIAGNOSIS — Z1211 Encounter for screening for malignant neoplasm of colon: Secondary | ICD-10-CM | POA: Diagnosis not present

## 2020-01-09 DIAGNOSIS — D128 Benign neoplasm of rectum: Secondary | ICD-10-CM

## 2020-01-09 DIAGNOSIS — D122 Benign neoplasm of ascending colon: Secondary | ICD-10-CM | POA: Diagnosis not present

## 2020-01-09 MED ORDER — SODIUM CHLORIDE 0.9 % IV SOLN: 500.0000 mL | Freq: Once | INTRAVENOUS | Status: DC

## 2020-01-09 NOTE — Progress Notes (Signed)
Called to room to assist during endoscopic procedure.  Patient ID and intended procedure confirmed with present staff. Received instructions for my participation in the procedure from the performing physician.  

## 2020-01-09 NOTE — Progress Notes (Signed)
PT taken to PACU. Monitors in place. VSS. Report given to RN. 

## 2020-01-09 NOTE — Op Note (Signed)
[facility] Patient Name: Kelly Reed Procedure Date: 01/09/2020 7:38 AM MRN: [id number] Endoscopist: [place]. Loletha Carrow , MD Age: 54 Referring MD:  Date of Birth: 29-Dec-1965 Gender: Female Account #: [id number] Procedure:                Colonoscopy Indications:              Screening for colorectal malignant neoplasm, This                            is the patient's first colonoscopy Medicines:                Monitored Anesthesia Care Procedure:                Pre-Anesthesia Assessment:                           - Prior to the procedure, a History and Physical                            was performed, and patient medications and                            allergies were reviewed. The patient's tolerance of                            previous anesthesia was also reviewed. The risks                            and benefits of the procedure and the sedation                            options and risks were discussed with the patient.                            All questions were answered, and informed consent                            was obtained. Prior Anticoagulants: The patient has                            taken no previous anticoagulant or antiplatelet                            agents except for aspirin. ASA Grade Assessment: II                            - A patient with mild systemic disease. After                            reviewing the risks and benefits, the patient was                            deemed in satisfactory condition to undergo the  procedure.                           After obtaining informed consent, the colonoscope                            was passed under direct vision. Throughout the                            procedure, the patient's blood pressure, pulse, and                            oxygen saturations were monitored continuously. The                            Colonoscope was introduced through the anus and                             advanced to the the cecum, identified by                            appendiceal orifice and ileocecal valve. The                            colonoscopy was performed without difficulty. The                            patient tolerated the procedure well. The quality                            of the bowel preparation was good. The ileocecal                            valve, appendiceal orifice, and rectum were                            photographed. The bowel preparation used was 2 day                            Suprep/Miralax. Scope In: 8:34:22 AM Scope Out: 8:50:30 AM Scope Withdrawal Time: 0 hours 12 minutes 22 seconds  Total Procedure Duration: 0 hours 16 minutes 8 seconds  Findings:                 The perianal and digital rectal examinations were                            normal.                           A 5 mm polyp was found in the ascending colon. The                            polyp was sessile (probable TA by WL and NBI). The  polyp was removed with a cold snare. Resection and                            retrieval were complete.                           A diminutive polyp was found in the rectum. The                            polyp was sessile. The polyp was removed with a                            cold snare. Resection and retrieval were complete.                           Multiple diverticula were found in the left colon.                           The exam was otherwise without abnormality on                            direct and retroflexion views. Complications:            No immediate complications. Estimated Blood Loss:     Estimated blood loss was minimal. Impression:               - One 5 mm polyp in the ascending colon, removed                            with a cold snare. Resected and retrieved.                           - One diminutive polyp in the rectum, removed with                            a cold snare. Resected  and retrieved.                           - Diverticulosis in the left colon.                           - The examination was otherwise normal on direct                            and retroflexion views. Recommendation:           - Patient has a contact number available for                            emergencies. The signs and symptoms of potential                            delayed complications were discussed with the  patient. Return to normal activities tomorrow.                            Written discharge instructions were provided to the                            patient.                           - Resume previous diet.                           - Continue present medications.                           - Await pathology results.                           - Repeat colonoscopy is recommended for                            surveillance. The colonoscopy date will be                            determined after pathology results from today's                            exam become available for review. Alisa Stjames L. Loletha Carrow, MD 01/09/2020 9:02:54 AM This report has been signed electronically.

## 2020-01-09 NOTE — Patient Instructions (Signed)
YOU HAD AN ENDOSCOPIC PROCEDURE TODAY AT THE [facility]:   Refer to the procedure report that was given to you for any specific questions about what was found during the examination.  If the procedure report does not answer your questions, please call your gastroenterologist to clarify.  If you requested that your care partner not be given the details of your procedure findings, then the procedure report has been included in a sealed envelope for you to review at your convenience later.  YOU SHOULD EXPECT: Some feelings of bloating in the abdomen. Passage of more gas than usual.  Walking can help get rid of the air that was put into your GI tract during the procedure and reduce the bloating. If you had a lower endoscopy (such as a colonoscopy or flexible sigmoidoscopy) you may notice spotting of blood in your stool or on the toilet paper. If you underwent a bowel prep for your procedure, you may not have a normal bowel movement for a few days.  Please Note:  You might notice some irritation and congestion in your nose or some drainage.  This is from the oxygen used during your procedure.  There is no need for concern and it should clear up in a day or so.  SYMPTOMS TO REPORT IMMEDIATELY:   Following lower endoscopy (colonoscopy or flexible sigmoidoscopy):  Excessive amounts of blood in the stool  Significant tenderness or worsening of abdominal pains  Swelling of the abdomen that is new, acute  Fever of 100F or higher   For urgent or emergent issues, a gastroenterologist can be reached at any hour by calling [phone]. Do not use MyChart messaging for urgent concerns.    DIET:  We do recommend a small meal at first, but then you may proceed to your regular diet.  Drink plenty of fluids but you should avoid alcoholic beverages for 24 hours.  MEDICATIONS: Continue present medications.  Please see handouts given to you by your recovery nurse.  ACTIVITY:  You should plan to  take it easy for the rest of today and you should NOT DRIVE or use heavy machinery until tomorrow (because of the sedation medicines used during the test).    FOLLOW UP: Our staff will call the number listed on your records 48-72 hours following your procedure to check on you and address any questions or concerns that you may have regarding the information given to you following your procedure. If we do not reach you, we will leave a message.  We will attempt to reach you two times.  During this call, we will ask if you have developed any symptoms of COVID 19. If you develop any symptoms (ie: fever, flu-like symptoms, shortness of breath, cough etc.) before then, please call [phone].  If you test positive for Covid 19 in the 2 weeks post procedure, please call and report this information to us.    If any biopsies were taken you will be contacted by phone or by letter within the next 1-3 weeks.  Please call us at [phone] if you have not heard about the biopsies in 3 weeks.   Thank you for allowing us to provide for your healthcare needs today.   SIGNATURES/CONFIDENTIALITY: You and/or your care partner have signed paperwork which will be entered into your electronic medical record.  These signatures attest to the fact that that the information above on your After Visit Summary has been reviewed and is understood.  Full responsibility of the   confidentiality of this discharge information lies with you and/or your care-partner. 

## 2020-01-09 NOTE — Progress Notes (Signed)
Pt's states no medical or surgical changes since previsit or office visit. 

## 2020-01-14 ENCOUNTER — Telehealth: Payer: Self-pay

## 2020-01-14 ENCOUNTER — Telehealth: Payer: Self-pay | Admitting: Family Medicine

## 2020-01-14 NOTE — Telephone Encounter (Signed)
Patient is calling to say she started having really bad cramps in her legs and inner thighs at night and remembers that atorvastatin may be an intolerant medication due to cramping .  She states will d/c taking for now until further recommendations or new orders. Please advice.

## 2020-01-14 NOTE — Telephone Encounter (Signed)
Second post procedure follow up call, no answer 

## 2020-01-14 NOTE — Telephone Encounter (Signed)
No answer, left message to call back later today, B.Takela Varden RN. 

## 2020-01-14 NOTE — Telephone Encounter (Signed)
Pt called and is having problems with cramping and thinks it is the new cholesterol medication. Pt wants to bee seen by provider but earliest appt was for 01/22/20. Pt is wondering if sh e can see you for this over video this appt for the 01/22/20 is also a f/u from pts colonoscopy. Pt would like a call to see if this is possible. Please advise.

## 2020-01-15 ENCOUNTER — Encounter: Payer: Self-pay | Admitting: Gastroenterology

## 2020-01-15 NOTE — Telephone Encounter (Signed)
Agree with trial off statin. Can discuss further on upcoming OV on the 15th.

## 2020-01-22 ENCOUNTER — Other Ambulatory Visit: Payer: Self-pay

## 2020-01-22 ENCOUNTER — Telehealth (INDEPENDENT_AMBULATORY_CARE_PROVIDER_SITE_OTHER): Payer: PRIVATE HEALTH INSURANCE | Admitting: Family Medicine

## 2020-01-22 ENCOUNTER — Encounter: Payer: Self-pay | Admitting: Family Medicine

## 2020-01-22 DIAGNOSIS — R748 Abnormal levels of other serum enzymes: Secondary | ICD-10-CM | POA: Diagnosis not present

## 2020-01-22 DIAGNOSIS — E1165 Type 2 diabetes mellitus with hyperglycemia: Secondary | ICD-10-CM

## 2020-01-22 DIAGNOSIS — M791 Myalgia, unspecified site: Secondary | ICD-10-CM | POA: Diagnosis not present

## 2020-01-22 NOTE — Progress Notes (Signed)
Virtual Visit Note  I connected with patient on 01/22/20 at 1016am by phone (per patient's preference) and verified that I am speaking with the correct person using two identifiers. Kelly Reed is currently located at work and patient is currently with them during visit. The provider, Rutherford Guys, MD is located in their office at time of visit.  I discussed the limitations, risks, security and privacy concerns of performing an evaluation and management service by telephone and the availability of in person appointments. I also discussed with the patient that there may be a patient responsible charge related to this service. The patient expressed understanding and agreed to proceed.   I provided 9 minutes of non-face-to-face time during this encounter.  Chief Complaint  Patient presents with  . Follow-up    following up after colonoscopy and body cramping. Has stopped taking the atorvastatin thinking it may be contributing to cramps, one wk since she stopped taking med. Still having some cramping but not as bad.     HPI ? Patient stopped atorvastatin due to sign cramping/muscle pain of legs and thighs, she remembers having similar issues in the past with atorvastatin She stopped atorvastatin a week ago She reports that cramping/uscle pain is better They have decreased from daily to only twice this week She drinks plenty of water and mg supplement She also drinks G2 as she works in very hot and humid environment Indication for statin: HLP and DM2  Also had stop [place] as elevated lipase cbgs have been a bit higher 120-130s  Had colonoscopy, adenomatous polyps, repeat in 7 years    Allergies  Allergen Reactions  . Adhesive [Tape] Rash and Other (See Comments)    Peeling of skin  . Prednisone     Hyperglycemia with oral steroids    Prior to Admission medications   Medication Sig Start Date End Date Taking? Authorizing Provider  ACCU-CHEK FASTCLIX LANCETS MISC U UTD TO  CHECK BG TID 05/11/18  Yes [provider]  ACCU-CHEK GUIDE test strip U UTD TO CHECK BG TID 04/30/18  Yes [provider]  albuterol (VENTOLIN HFA) 108 (90 Base) MCG/ACT inhaler INHALE 2 PUFFS BY MOUTH INTO THE LUNGS EVERY 6 HOURS AS NEEDED WHEEZING OR SHORTNESS OF BREATH 04/29/19  Yes Wendall Mola, NP  aspirin EC 81 MG tablet Take 1 tablet (81 mg total) by mouth daily. 08/07/14  Yes Shawnee Knapp, MD  Blood Glucose Monitoring Suppl (ACCU-CHEK GUIDE ME) w/Device KIT U UTD TO CHECK BG TID 05/11/18  Yes [provider]  Epinastine HCl 0.05 % ophthalmic solution Place 1 drop into both eyes 2 (two) times daily. 09/21/16  Yes Shawnee Knapp, MD  levocetirizine (XYZAL) 5 MG tablet Take 1 tablet by mouth every evening.  03/08/17  Yes [provider]  losartan (COZAAR) 100 MG tablet Take 1 tablet (100 mg total) by mouth daily. TAKE 1 TABLET BY MOUTH DAILY 10/31/19  Yes Rutherford Guys, MD  MAGNESIUM PO Take by mouth.   Yes [provider]  meclizine (ANTIVERT) 25 MG tablet Take 1 tablet (25 mg total) by mouth 4 (four) times daily as needed for dizziness. 12/23/19 01/22/20 Yes Betancourt, Aura Fey, NP  metFORMIN (GLUCOPHAGE) 1000 MG tablet TAKE 1 TABLET(1000 MG) BY MOUTH TWICE DAILY WITH A MEAL 09/12/19  Yes Rutherford Guys, MD  montelukast (SINGULAIR) 10 MG tablet TAKE 1 TABLET(10 MG) BY MOUTH AT BEDTIME 04/21/19  Yes Rutherford Guys, MD  Multiple Vitamins-Minerals (  CENTRUM ADULTS PO) Take by mouth.   Yes [provider]  NON FORMULARY OTC stool softener- uses every other day per pt   Yes [provider]  omeprazole (PRILOSEC) 20 MG capsule Take 1 capsule (20 mg total) by mouth 2 (two) times daily before a meal. 11/28/19  Yes Rutherford Guys, MD  PARoxetine (PAXIL) 30 MG tablet Take 1 tablet (30 mg total) by mouth daily. 12/26/19  Yes Rutherford Guys, MD  sodium chloride (OCEAN) 0.65 % SOLN nasal spray Place 1 spray into both nostrils every 2 (two)  hours while awake. 04/15/19  Yes Betancourt, Aura Fey, NP  venlafaxine XR (EFFEXOR-XR) 150 MG 24 hr capsule Take 1 capsule (150 mg total) by mouth daily with breakfast. Take one capsule daily 01/31/19  Yes Rutherford Guys, MD  vitamin C (ASCORBIC ACID) 500 MG tablet Take 500 mg by mouth daily.   Yes [provider]  Grant Ruts INHUB 250-50 MCG/DOSE AEPB Inhale 1 puff into the lungs 2 (two) times daily. 03/27/19  Yes [provider]  Zinc 30 MG TABS Take by mouth.   Yes [provider]  atorvastatin (LIPITOR) 10 MG tablet TAKE 1 TABLET(10 MG) BY MOUTH DAILY Patient not taking: Reported on 01/22/2020 12/15/19   Rutherford Guys, MD  fluticasone [facility]) 50 MCG/ACT nasal spray Place 1 spray into both nostrils 2 (two) times daily. 04/15/19 11/18/19  BetancourtAura Fey, NP    Past Medical History:  Diagnosis Date  . Allergy   . Anemia   . Anxiety   . Asthma   . COVID-19 04/29/2019  . COVID-19 virus detected 04/11/2019  . Diabetes mellitus type II   . GERD (gastroesophageal reflux disease)   . Hyperlipidemia   . Hypertension   . NSVD (normal spontaneous vaginal delivery)    x 2    Past Surgical History:  Procedure Laterality Date  . GANGLION CYST EXCISION     left foot  . 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 History of Abd. ultrasound  12/04   negative  . Surg. eval lap cholecstectomy  05/28/2006   deferred by patient  . TUBAL LIGATION  1993   bilateral    Social History   Tobacco Use  . Smoking status: Never Smoker  . Smokeless tobacco: Never Used  Substance Use Topics  . Alcohol use: Yes    Alcohol/week: 1.0 - 2.0 standard drink    Types: 1 - 2 Standard drinks or equivalent per week    Comment: Occasional beer    Family History  Problem Relation Age of Onset  . Breast cancer Mother 23       pt's mother passed away from breast cancer when pt was 54 yo  . Diabetes Maternal Grandmother   . Colon cancer Neg Hx   . Esophageal cancer Neg Hx    . Rectal cancer Neg Hx   . Stomach cancer Neg Hx     ROS Per hpi  Objective  Vitals as reported by the patient: none   ASSESSMENT and PLAN  1. Myalgia Improving off atorvastatin, recheck labs, consider crestor 35m qod  2. Elevated lipase - Lipase; Future  3. Type 2 diabetes mellitus with hyperglycemia, without long-term current use of insulin (HCC) Last a1c at goal, fasting cbgs remain at goal. Cont with LFM. - Comprehensive metabolic panel; Future - Lipid panel; Future  Return in about 4 months (around 05/24/2020) for labs in 4 weeks, routine followup in 4  months.  The above assessment and management plan was discussed with the patient. The patient verbalized understanding of and has agreed to the management plan. Patient is aware to call the clinic if symptoms persist or worsen. Patient is aware when to return to the clinic for a follow-up visit. Patient educated on when it is appropriate to go to the emergency department.     Rutherford Guys, MD Primary [facility] at [facility] [place], [place] [address] Ph.  [phone] Fax [phone]

## 2020-01-23 ENCOUNTER — Telehealth: Payer: Self-pay | Admitting: Family Medicine

## 2020-01-23 NOTE — Telephone Encounter (Signed)
01/23/2020 - PATIENT HAD A VIRTUAL APPOINTMENT WITH DR. Benay Spice ON Thursday (01/22/20). SHE HAS REQUESTED PATIENT HAVE A NURSE'S VISIT IN 1 MONTH TO GET HER LABS DRAWN. THE ORDERS ARE IN. SHE ALSO WANTS PATIENT TO F/U WITH HER IN 4 MONTHS. WE WILL CALL HER WHEN DR. Gearldine Shown CALENDAR OPENS FOR NOV. 2021. I HAD TO LEAVE HER A VOICE MAIL TO RETURN MY CALL. [place]

## 2020-02-23 ENCOUNTER — Other Ambulatory Visit: Payer: Self-pay

## 2020-02-23 ENCOUNTER — Ambulatory Visit (INDEPENDENT_AMBULATORY_CARE_PROVIDER_SITE_OTHER): Payer: PRIVATE HEALTH INSURANCE | Admitting: Family Medicine

## 2020-02-23 DIAGNOSIS — E1165 Type 2 diabetes mellitus with hyperglycemia: Secondary | ICD-10-CM | POA: Diagnosis not present

## 2020-02-23 DIAGNOSIS — R748 Abnormal levels of other serum enzymes: Secondary | ICD-10-CM

## 2020-02-23 NOTE — Patient Instructions (Signed)
° ° ° °  If you have lab work done today you will be contacted with your lab results within the next 2 weeks.  If you have not heard from us then please contact us. The fastest way to get your results is to register for My Chart. ° ° °IF you received an x-ray today, you will receive an invoice from [facility]. Please contact [facility] at [phone] with questions or concerns regarding your invoice.  ° °IF you received labwork today, you will receive an invoice from [facility]. Please contact [facility] at [phone] with questions or concerns regarding your invoice.  ° °Our billing staff will not be able to assist you with questions regarding bills from these companies. ° °You will be contacted with the lab results as soon as they are available. The fastest way to get your results is to activate your My Chart account. Instructions are located on the last page of this paperwork. If you have not heard from us regarding the results in 2 weeks, please contact this office. °  ° ° ° °

## 2020-02-23 NOTE — Progress Notes (Signed)
Pt here for blood draw, 2 marbled red tops and 1 lavender collected

## 2020-02-24 LAB — COMPREHENSIVE METABOLIC PANEL
ALT: 56 IU/L — ABNORMAL HIGH (ref 0–32)
AST: 30 IU/L (ref 0–40)
Albumin/Globulin Ratio: 2.1 (ref 1.2–2.2)
Albumin: 4.9 g/dL (ref 3.8–4.9)
Alkaline Phosphatase: 114 IU/L (ref 48–121)
BUN/Creatinine Ratio: 18 (ref 9–23)
BUN: 13 mg/dL (ref 6–24)
Bilirubin Total: <0.2 mg/dL (ref 0.0–1.2)
CO2: 23 mmol/L (ref 20–29)
Calcium: 9.6 mg/dL (ref 8.7–10.2)
Chloride: 99 mmol/L (ref 96–106)
Creatinine, Ser: 0.73 mg/dL (ref 0.57–1.00)
GFR calc Af Amer: 107 mL/min/{1.73_m2} (ref >59)
GFR calc non Af Amer: 93 mL/min/{1.73_m2} (ref >59)
Globulin, Total: 2.3 g/dL (ref 1.5–4.5)
Glucose: 206 mg/dL — ABNORMAL HIGH (ref 65–99)
Potassium: 4.2 mmol/L (ref 3.5–5.2)
Sodium: 140 mmol/L (ref 134–144)
Total Protein: 7.2 g/dL (ref 6.0–8.5)

## 2020-02-24 LAB — LIPID PANEL
Chol/HDL Ratio: 4.5 ratio — ABNORMAL HIGH (ref 0.0–4.4)
Cholesterol, Total: 257 mg/dL — ABNORMAL HIGH (ref 100–199)
HDL: 57 mg/dL (ref >39)
LDL Chol Calc (NIH): 134 mg/dL — ABNORMAL HIGH (ref 0–99)
Triglycerides: 366 mg/dL — ABNORMAL HIGH (ref 0–149)
VLDL Cholesterol Cal: 66 mg/dL — ABNORMAL HIGH (ref 5–40)

## 2020-02-24 LAB — LIPASE: Lipase: 40 U/L (ref 14–72)

## 2020-03-02 ENCOUNTER — Encounter: Payer: Self-pay | Admitting: Registered Nurse

## 2020-03-02 ENCOUNTER — Other Ambulatory Visit: Payer: Self-pay

## 2020-03-02 ENCOUNTER — Ambulatory Visit: Payer: Self-pay | Admitting: Registered Nurse

## 2020-03-02 VITALS — BP 127/91 | HR 116 | Temp 99.4°F

## 2020-03-02 DIAGNOSIS — H6691 Otitis media, unspecified, right ear: Secondary | ICD-10-CM

## 2020-03-02 DIAGNOSIS — J0111 Acute recurrent frontal sinusitis: Secondary | ICD-10-CM

## 2020-03-02 MED ORDER — AZITHROMYCIN 250 MG PO TABS: ORAL_TABLET | ORAL | 0 refills | Status: DC

## 2020-03-02 NOTE — Progress Notes (Signed)
Subjective:    Patient ID: Kelly Reed, female    DOB: September 11, 1965, 54 y.o.   MRN: [id number]  53y/o Caucasian established female pt c/o sinus pain/pressure maxillary and frontal, otalgia, sore throat, PND, intermittent low grade fever. Sx since yesterday. Tylenol at home. 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 Didn't sleep well last night.  Typically doesn't have flare up with fall [place].  Has been taking her allergy seasonal medications every day including flonase and nasal saline.  Used nasal saline multiple times per day at home but not as much at work forgot saline at home yesterday but has it with her today.  Fully covid vaccinated and had covid Oct 2020.  No known covid + contacts recently.  Last sinus infection 12/23/2019 azithromycin     Review of Systems  Constitutional: Positive for activity change, appetite change, fatigue and fever. Negative for chills and diaphoresis.  HENT: Positive for congestion, postnasal drip, rhinorrhea, sinus pressure, sinus pain and sore throat. Negative for dental problem, drooling, ear discharge, ear pain, facial swelling, hearing loss, mouth sores, nosebleeds, trouble swallowing and voice change.   Eyes: Negative for photophobia, pain, discharge, redness, itching and visual disturbance.  Respiratory: Negative for cough, shortness of breath, wheezing and stridor.   Cardiovascular: Negative for chest pain and palpitations.  Gastrointestinal: Negative for abdominal pain, diarrhea, nausea and vomiting.  Endocrine: Negative for cold intolerance and heat intolerance.  Genitourinary: Negative for difficulty urinating.  Musculoskeletal: Positive for back pain and myalgias. Negative for gait problem, joint swelling, neck pain and neck stiffness.  Skin: Negative for rash.  Allergic/Immunologic:  Positive for environmental allergies and immunocompromised state. Negative for food allergies.  Neurological: Positive for headaches. Negative for dizziness, tremors, seizures, syncope, facial asymmetry, speech difficulty, weakness, light-headedness and numbness.  Psychiatric/Behavioral: Positive for sleep disturbance. Negative for agitation and confusion.       Objective:   Physical Exam Vitals and nursing note reviewed.  Constitutional:      General: She is awake. She is not in acute distress.    Appearance: Normal appearance. She is well-developed and well-groomed. She is ill-appearing. She is not toxic-appearing or diaphoretic.  HENT:     Head: Normocephalic and atraumatic.     Jaw: There is normal jaw occlusion. No trismus.     Salivary Glands: Right salivary gland is not diffusely enlarged or tender. Left salivary gland is not diffusely enlarged or tender.     Right Ear: Hearing, ear canal and external ear normal. A middle ear effusion is present. Tympanic membrane is erythematous and bulging. Tympanic membrane is not injected.     Left Ear: Hearing, ear canal and external ear normal. A middle ear effusion is present.     Nose: Mucosal edema, congestion and rhinorrhea present. No nasal deformity, septal deviation or laceration. Rhinorrhea is clear.     Right Turbinates: Enlarged and swollen. Not pale.     Left Turbinates: Enlarged and swollen. Not pale.     Right Sinus: Maxillary sinus tenderness and frontal sinus tenderness present.     Left Sinus: Maxillary sinus tenderness and frontal sinus tenderness present.     Mouth/Throat:     Lips: Pink. No lesions.     Mouth: Mucous membranes are moist. Mucous membranes are not pale, not dry and not cyanotic. No injury, lacerations, oral lesions  or angioedema.     Dentition: No dental tenderness, gingival swelling, dental caries, dental abscesses or gum lesions.     Tongue: No lesions. Tongue does not deviate from midline.     Palate: No  mass and lesions.     Pharynx: Uvula midline. Pharyngeal swelling and posterior oropharyngeal erythema present. No oropharyngeal exudate or uvula swelling.     Tonsils: No tonsillar exudate or tonsillar abscesses. 0 on the right. 0 on the left.     Comments: Cobblestoning posterior pharynx; right TM erythema greatest centrally air fluid level clear; left TM air fluid level clear; bilateral allergic shiners; clear discharge bilateral nasal turbinates; frontal greater than maxillary TTP bilaterally Eyes:     General: Lids are normal. Vision grossly intact. Gaze aligned appropriately. Allergic shiner present. No visual field deficit or scleral icterus.       Right eye: No foreign body, discharge or hordeolum.        Left eye: No foreign body, discharge or hordeolum.     Extraocular Movements: Extraocular movements intact.     Right eye: Normal extraocular motion and no nystagmus.     Left eye: Normal extraocular motion and no nystagmus.     Conjunctiva/sclera: Conjunctivae normal.     Right eye: Right conjunctiva is not injected. No chemosis, exudate or hemorrhage.    Left eye: Left conjunctiva is not injected. No chemosis, exudate or hemorrhage.    Pupils: Pupils are equal, round, and reactive to light. Pupils are equal.     Right eye: Pupil is round and reactive.     Left eye: Pupil is round and reactive.  Neck:     Thyroid: No thyroid tenderness.     Trachea: Trachea and phonation normal. No tracheal tenderness or tracheal deviation.  Cardiovascular:     Rate and Rhythm: Regular rhythm. Tachycardia present.     Chest Wall: PMI is not displaced.     Pulses: Normal pulses.          Radial pulses are 2+ on the right side and 2+ on the left side.     Heart sounds: Normal heart sounds, S1 normal and S2 normal. No murmur heard.  No friction rub. No gallop.      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 No accessory muscle usage or respiratory distress.     Breath sounds: Normal breath sounds and air entry. No stridor, decreased air movement or transmitted upper airway sounds. No decreased breath sounds, wheezing, rhonchi or rales.     Comments: Wearing reusable cloth mask due to covid 19 pandemic; spoke full sentences without difficulty; no cough observed in exam room Chest:     Chest wall: No tenderness.  Abdominal:     General: Abdomen is flat. There is no distension.     Palpations: Abdomen is soft.  Musculoskeletal:        General: No swelling or tenderness. Normal range of motion.     Right shoulder: Normal.     Left shoulder: Normal.     Right elbow: Normal.     Left elbow: Normal.     Right hand: Normal.     Left hand: Normal.     Cervical back: Normal, normal range of motion and neck supple. No swelling, edema, deformity, erythema, signs of trauma, lacerations, rigidity, torticollis, tenderness or crepitus. No spinous process tenderness  or muscular tenderness. Normal range of motion.     Thoracic back: Normal. No swelling, edema, deformity, signs of trauma or lacerations. Normal range of motion. No scoliosis.     Lumbar back: No swelling, edema, deformity, signs of trauma or lacerations. Normal range of motion. No scoliosis.     Right hip: Normal.     Left hip: Normal.     Right knee: Normal.     Left knee: Normal.  Lymphadenopathy:     Head:     Right side of head: No submental, submandibular, tonsillar, preauricular, posterior auricular or occipital adenopathy.     Left side of head: No submental, submandibular, tonsillar, preauricular, posterior auricular or occipital adenopathy.     Cervical: No cervical adenopathy.     Right cervical: No superficial, deep or posterior cervical adenopathy.    Left cervical: No superficial, deep or posterior cervical adenopathy.  Skin:    General: Skin is warm and dry.     Capillary Refill: Capillary  refill takes less than 2 seconds.     Coloration: Skin is not ashen, cyanotic, jaundiced, mottled, pale or sallow.     Findings: No abrasion, abscess, acne, bruising, burn, ecchymosis, erythema, signs of injury, laceration, lesion, petechiae, rash or wound.     Nails: There is no clubbing.  Neurological:     General: No focal deficit present.     Mental Status: She is alert and oriented to person, place, and time. Mental status is at baseline. She is not disoriented.     GCS: GCS eye subscore is 4. GCS verbal subscore is 5. GCS motor subscore is 6.     Cranial Nerves: Cranial nerves are intact. No cranial nerve deficit, dysarthria or facial asymmetry.     Sensory: Sensation is intact. No sensory deficit.     Motor: Motor function is intact. No weakness, tremor, atrophy, abnormal muscle tone or seizure activity.     Coordination: Coordination is intact. Coordination normal.     Gait: Gait is intact. Gait normal.     Comments: Gait sure and steady in clinic; in/out of chair and on/off exam table without difficulty; bilateral hand grasp equal 5/5  Psychiatric:        Attention and Perception: Attention and perception normal.        Mood and Affect: Mood and affect normal.        Speech: Speech normal.        Behavior: Behavior normal. Behavior is cooperative.        Thought Content: Thought content normal.        Cognition and Memory: Cognition and memory normal.        Judgment: Judgment normal.           Assessment & Plan:  A-acute recurrent frontal sinusitis, right acute otitis media  P-Recommended patient to go home as fever prior to start of shift today, tylenol did help but due to covid pandemic employer does not want employees on site if fever in previous 24 hours.  Discussed for patient to stay home tomorrow.   Discussed if still having fever after 48 hours on azithromycin 250mg  take 2 day 1 by mouth then take 1 by mouth days 2-5 for sinusitis not helping and still having fever  to stay home Thursday 03/04/2020 and contact clinic via telephone. Wear mask when around others.  Discussed covid booster for immunocompromised [place] citizen recommended.  Her diabetes in [place] and I recommended discussing with her PCM.  Has had covid infection previously Oct 2020 will provide some antibodies also on top of her two vaccines.  I still recommend she receive 8 month booster due Dec 2021 as last vaccine dose Apr 2021.  If symptoms not improving/still having fever/chills I recommend covid testing on Thursday 8/26 as day 4 of symptoms that are also consistent with covid.  [employer] [facility] notified.  Exitcare handouts printed and given on covid 19 FAQ.  Patient verbalized understanding information/instructions, agreed with plan of care and had no further questions at this time.  Continue flonase 1 spray each nostril BID, saline 2 sprays each nostril q2h wa prn congestion. start azithromycin 250mg  take 2 tabs day one then take 1 tab days 2-5 #6 RF0 dispensed from [facility] to patient.Denied personal or family history of ENT cancer. Shower BID especially prior to bed. No evidence of systemic bacterial infection, non toxic and well hydrated. I do not see where any further testing or imaging is necessary at this time. I will suggest supportive care, rest, good hygiene and encourage the patient to take adequate fluids. The patient is to return to clinic or EMERGENCY ROOM if symptoms worsen or change significantly. Exitcare handout on sinusitis and sinus rinse. Patient verbalized agreement and understanding of treatment plan and had no further questions at this time.  P2: Hand washing and cover cough  Supportive treatment. Azithromycin 250mg  take 2 tabs day 1 then 1 tab days 2-5 #6 RF0 dispensed from [facility] to patient  Tylenol 1000mg  po QID prn pain/fever.   No evidence of invasive bacterial infection, non toxic and well hydrated.   I do not see where any further testing or imaging is necessary  at this time.   I will suggest supportive care, rest, good hygiene and encourage the patient to take adequate fluids.  The patient is to return to clinic or EMERGENCY ROOM if symptoms worsen or change significantly e.g. ear pain, fever, purulent discharge from ears or bleeding.  Exitcare handout on otitis media printed and given to patient.   Patient verbalized agreement and understanding of treatment plan and had no further questions at this time.

## 2020-03-02 NOTE — Patient Instructions (Signed)
COVID-19 Frequently Asked Questions COVID-19 (coronavirus disease) is an infection that is caused by a large family of viruses. Some viruses cause illness in people and others cause illness in animals like camels, cats, and bats. In some cases, the viruses that cause illness in animals can spread to humans. Where did the coronavirus come from? In December 2019, [place] told the World Health Organization (WHO) of several cases of lung disease (human respiratory illness). These cases were linked to an open seafood and livestock market in the city of [place]. The link to the seafood and livestock market suggests that the virus may have spread from animals to humans. However, since that first outbreak in December, the virus has also been shown to spread from person to person. What is the name of the disease and the virus? Disease name Early on, this disease was called novel coronavirus. This is because scientists determined that the disease was caused by a new (novel) respiratory virus. The World Health Organization (WHO) has now named the disease COVID-19, or coronavirus disease. Virus name The virus that causes the disease is called severe acute respiratory syndrome coronavirus 2 (SARS-CoV-2). More information on disease and virus naming World Health Organization (WHO): www.who.int/emergencies/diseases/novel-coronavirus-2019/technical-guidance/naming-the-coronavirus-disease-(covid-2019)-and-the-virus-that-causes-it Who is at risk for complications from coronavirus disease? Some people may be at higher risk for complications from coronavirus disease. This includes older adults and people who have chronic diseases, such as heart disease, diabetes, and lung disease. If you are at higher risk for complications, take these extra precautions:  Stay home as much as possible.  Avoid social gatherings and travel.  Avoid close contact with others. Stay at least 6 ft (2 m) away from others, if possible.  Wash  your hands often with soap and water for at least 20 seconds.  Avoid touching your face, mouth, nose, or eyes.  Keep supplies on hand at home, such as food, medicine, and cleaning supplies.  If you must go out in public, wear a cloth face covering or face mask. Make sure your mask covers your nose and mouth. How does coronavirus disease spread? The virus that causes coronavirus disease spreads easily from person to person (is contagious). You may catch the virus by:  Breathing in droplets from an infected person. Droplets can be spread by a person breathing, speaking, singing, coughing, or sneezing.  Touching something, like a table or a doorknob, that was exposed to the virus (contaminated) and then touching your mouth, nose, or eyes. Can I get the virus from touching surfaces or objects? There is still a lot that we do not know about the virus that causes coronavirus disease. Scientists are basing a lot of information on what they know about similar viruses, such as:  Viruses cannot generally survive on surfaces for long. They need a human body (host) to survive.  It is more likely that the virus is spread by close contact with people who are sick (direct contact), such as through: ? Shaking hands or hugging. ? Breathing in respiratory droplets that travel through the air. Droplets can be spread by a person breathing, speaking, singing, coughing, or sneezing.  It is less likely that the virus is spread when a person touches a surface or object that has the virus on it (indirect contact). The virus may be able to enter the body if the person touches a surface or object and then touches his or her face, eyes, nose, or mouth. Can a person spread the virus without having symptoms of the disease?   It may be possible for the virus to spread before a person has symptoms of the disease, but this is most likely not the main way the virus is spreading. It is more likely for the virus to spread by  being in close contact with people who are sick and breathing in the respiratory droplets spread by a person breathing, speaking, singing, coughing, or sneezing. What are the symptoms of coronavirus disease? Symptoms vary from person to person and can range from mild to severe. Symptoms may include:  Fever or chills.  Cough.  Difficulty breathing or feeling short of breath.  Headaches, body aches, or muscle aches.  Runny or stuffy (congested) nose.  Sore throat.  New loss of taste or smell.  Nausea, vomiting, or diarrhea. These symptoms can appear anywhere from 2 to 14 days after you have been exposed to the virus. Some people may not have any symptoms. If you develop symptoms, call your health care provider. People with severe symptoms may need hospital care. Should I be tested for this virus? Your health care provider will decide whether to test you based on your symptoms, history of exposure, and your risk factors. How does a health care provider test for this virus? Health care providers will collect samples to send for testing. Samples may include:  Taking a swab of fluid from the back of your nose and throat, your nose, or your throat.  Taking fluid from the lungs by having you cough up mucus (sputum) into a sterile cup.  Taking a blood sample. Is there a treatment or vaccine for this virus? Currently, there is no vaccine to prevent coronavirus disease. Also, there are no medicines like antibiotics or antivirals to treat the virus. A person who becomes sick is given supportive care, which means rest and fluids. A person may also relieve his or her symptoms by using over-the-counter medicines that treat sneezing, coughing, and runny nose. These are the same medicines that a person takes for the common cold. If you develop symptoms, call your health care provider. People with severe symptoms may need hospital care. What can I do to protect myself and my family from this  virus?     You can protect yourself and your family by taking the same actions that you would take to prevent the spread of other viruses. Take the following actions:  Wash your hands often with soap and water for at least 20 seconds. If soap and water are not available, use alcohol-based hand sanitizer.  Avoid touching your face, mouth, nose, or eyes.  Cough or sneeze into a tissue, sleeve, or elbow. Do not cough or sneeze into your hand or the air. ? If you cough or sneeze into a tissue, throw it away immediately and wash your hands.  Disinfect objects and surfaces that you frequently touch every day.  Stay away from people who are sick.  Avoid going out in public, follow guidance from your state and local health authorities.  Avoid crowded indoor spaces. Stay at least 6 ft (2 m) away from others.  If you must go out in public, wear a cloth face covering or face mask. Make sure your mask covers your nose and mouth.  Stay home if you are sick, except to get medical care. Call your health care provider before you get medical care. Your health care provider will tell you how long to stay home.  Make sure your vaccines are up to date. Ask your health care provider what   vaccines you need. What should I do if I need to travel? Follow travel recommendations from your local health authority, the CDC, and WHO. Travel information and advice  Centers for Disease Control and Prevention (CDC): www.cdc.gov/coronavirus/2019-ncov/travelers/index.html  World Health Organization (WHO): www.who.int/emergencies/diseases/novel-coronavirus-2019/travel-advice Know the risks and take action to protect your health  You are at higher risk of getting coronavirus disease if you are traveling to areas with an outbreak or if you are exposed to travelers from areas with an outbreak.  Wash your hands often and practice good hygiene to lower the risk of catching or spreading the virus. What should I do if I  am sick? General instructions to stop the spread of infection  Wash your hands often with soap and water for at least 20 seconds. If soap and water are not available, use alcohol-based hand sanitizer.  Cough or sneeze into a tissue, sleeve, or elbow. Do not cough or sneeze into your hand or the air.  If you cough or sneeze into a tissue, throw it away immediately and wash your hands.  Stay home unless you must get medical care. Call your health care provider or local health authority before you get medical care.  Avoid public areas. Do not take public transportation, if possible.  If you can, wear a mask if you must go out of the house or if you are in close contact with someone who is not sick. Make sure your mask covers your nose and mouth. Keep your home clean  Disinfect objects and surfaces that are frequently touched every day. This may include: ? Counters and tables. ? Doorknobs and light switches. ? Sinks and faucets. ? Electronics such as phones, remote controls, keyboards, computers, and tablets.  Wash dishes in hot, soapy water or use a dishwasher. Air-dry your dishes.  Wash laundry in hot water. Prevent infecting other household members  Let healthy household members care for children and pets, if possible. If you have to care for children or pets, wash your hands often and wear a mask.  Sleep in a different bedroom or bed, if possible.  Do not share personal items, such as razors, toothbrushes, deodorant, combs, brushes, towels, and washcloths. Where to find more information Centers for Disease Control and Prevention (CDC)  Information and news updates: www.cdc.gov/coronavirus/2019-ncov World Health Organization (WHO)  Information and news updates: www.who.int/emergencies/diseases/novel-coronavirus-2019  Coronavirus health topic: www.who.int/health-topics/coronavirus  Questions and answers on COVID-19: www.who.int/news-room/q-a-detail/q-a-coronaviruses  Global  tracker: who.sprinklr.com American Academy of Pediatrics (AAP)  Information for families: www.healthychildren.org/English/health-issues/conditions/chest-lungs/Pages/2019-Novel-Coronavirus.aspx The coronavirus situation is changing rapidly. Check your local health authority website or the CDC and WHO websites for updates and news. When should I contact a health care provider?  Contact your health care provider if you have symptoms of an infection, such as fever or cough, and you: ? Have been near anyone who is known to have coronavirus disease. ? Have come into contact with a person who is suspected to have coronavirus disease. ? Have traveled to an area where there is an outbreak of COVID-19. When should I get emergency medical care?  Get help right away by calling your local emergency services (911 in the [place] if you have: ? Trouble breathing. ? Pain or pressure in your chest. ? Confusion. ? Blue-tinged lips and fingernails. ? Difficulty waking from sleep. ? Symptoms that get worse. Let the emergency medical personnel know if you think you have coronavirus disease. Summary  A new respiratory virus is spreading from person to person and causing   COVID-19 (coronavirus disease).  The virus that causes COVID-19 appears to spread easily. It spreads from one person to another through droplets from breathing, speaking, singing, coughing, or sneezing.  Older adults and those with chronic diseases are at higher risk of disease. If you are at higher risk for complications, take extra precautions.  There is currently no vaccine to prevent coronavirus disease. There are no medicines, such as antibiotics or antivirals, to treat the virus.  You can protect yourself and your family by washing your hands often, avoiding touching your face, and covering your coughs and sneezes. This information is not intended to replace advice given to you by your health care provider. Make sure you discuss any  questions you have with your health care provider. Document Revised: 04/25/2019 Document Reviewed: 10/22/2018 Elsevier Patient Education  [place]. How to Perform a Sinus Rinse A sinus rinse is a home treatment that is used to rinse your sinuses with a sterile mixture of salt and water (saline solution). Sinuses are air-filled spaces in your skull behind the bones of your face and forehead that open into your nasal cavity. A sinus rinse can help to clear mucus, dirt, dust, or pollen from your nasal cavity. You may do a sinus rinse when you have a cold, a virus, nasal allergy symptoms, a sinus infection, or stuffiness in your nose or sinuses. Talk with your health care provider about whether a sinus rinse might help you. What are the risks? A sinus rinse is generally safe and effective. However, there are a few risks, which include:  A burning sensation in your sinuses. This may happen if you do not make the saline solution as directed. Be sure to follow all directions when making the saline solution.  Nasal irritation.  Infection from contaminated water. This is rare, but possible. Do not do a sinus rinse if you have had ear or nasal surgery, ear infection, or blocked ears. Supplies needed:  Saline solution or powder.  Distilled or sterile water may be needed to mix with saline powder. ? You may use boiled and cooled tap water. Boil tap water for 5 minutes; cool until it is lukewarm. Use within 24 hours. ? Do not use regular tap water to mix with the saline solution.  Neti pot or nasal rinse bottle. These supplies release the saline solution into your nose and through your sinuses. Neti pots and nasal rinse bottles can be purchased at [employer], a health food store, or online. How to perform a sinus rinse  1. Wash your hands with soap and water. 2. Wash your device according to the directions that came with the product and then dry it. 3. Use the solution that comes  with your product or one that is sold separately in stores. Follow the mixing directions on the package if you need to mix with sterile or distilled water. 4. Fill the device with the amount of saline solution noted in the device instructions. 5. Stand over a sink and tilt your head sideways over the sink. 6. Place the spout of the device in your upper nostril (the one closer to the ceiling). 7. Gently pour or squeeze the saline solution into your nasal cavity. The liquid should drain out from the lower nostril if you are not too congested. 8. While rinsing, breathe through your open mouth. 9. Gently blow your nose to clear any mucus and rinse solution. Blowing too hard may cause ear pain. 10. Repeat in your other  nostril. 11. Clean and rinse your device with clean water and then air-dry it. Talk with your health care provider or pharmacist if you have questions about how to do a sinus rinse. Summary  A sinus rinse is a home treatment that is used to rinse your sinuses with a sterile mixture of salt and water (saline solution).  A sinus rinse is generally safe and effective. Follow all instructions carefully.  Before doing a sinus rinse, talk with your health care provider about whether it would be helpful for you. This information is not intended to replace advice given to you by your health care provider. Make sure you discuss any questions you have with your health care provider. Document Revised: 04/23/2017 Document Reviewed: 04/23/2017 Elsevier Patient Education  [place]. Otitis Media, Adult  Otitis media occurs when there is inflammation and fluid in the middle ear. Your middle ear is a part of the ear that contains bones for hearing as well as air that helps send sounds to your brain. What are the causes? This condition is caused by a blockage in the eustachian tube. This tube drains fluid from the ear to the back of the nose (nasopharynx). A blockage in this tube can be  caused by an object or by swelling (edema) in the tube. Problems that can cause a blockage include:  A cold or other upper respiratory infection.  Allergies.  An irritant, such as tobacco smoke.  Enlarged adenoids. The adenoids are areas of soft tissue located high in the back of the throat, behind the nose and the roof of the mouth.  A mass in the nasopharynx.  Damage to the ear caused by pressure changes (barotrauma). What are the signs or symptoms? Symptoms of this condition include:  Ear pain.  A fever.  Decreased hearing.  A headache.  Tiredness (lethargy).  Fluid leaking from the ear.  Ringing in the ear. How is this diagnosed? This condition is diagnosed with a physical exam. During the exam your health care provider will use an instrument called an otoscope to look into your ear and check for redness, swelling, and fluid. He or she will also ask about your symptoms. Your health care provider may also order tests, such as:  A test to check the movement of the eardrum (pneumatic otoscopy). This test is done by squeezing a small amount of air into the ear.  A test that changes air pressure in the middle ear to check how well the eardrum moves and whether the eustachian tube is working (tympanogram). How is this treated? This condition usually goes away on its own within 3-5 days. But if the condition is caused by a bacteria infection and does not go away own its own, or keeps coming back, your health care provider may:  Prescribe antibiotic medicines to treat the infection.  Prescribe or recommend medicines to control pain. Follow these instructions at home:  Take over-the-counter and prescription medicines only as told by your health care provider.  If you were prescribed an antibiotic medicine, take it as told by your health care provider. Do not stop taking the antibiotic even if you start to feel better.  Keep all follow-up visits as told by your health care  provider. This is important. Contact a health care provider if:  You have bleeding from your nose.  There is a lump on your neck.  You are not getting better in 5 days.  You feel worse instead of better. Get help right  away if:  You have severe pain that is not controlled with medicine.  You have swelling, redness, or pain around your ear.  You have stiffness in your neck.  A part of your face is paralyzed.  The bone behind your ear (mastoid) is tender when you touch it.  You develop a severe headache. Summary  Otitis media is redness, soreness, and swelling of the middle ear.  This condition usually goes away on its own within 3-5 days.  If the problem does not go away in 3-5 days, your health care provider may prescribe or recommend medicines to treat your symptoms.  If you were prescribed an antibiotic medicine, take it as told by your health care provider. This information is not intended to replace advice given to you by your health care provider. Make sure you discuss any questions you have with your health care provider. Document Revised: 06/08/2017 Document Reviewed: 06/16/2016 Elsevier Patient Education  [place]. Sinusitis, Adult Sinusitis is inflammation of your sinuses. Sinuses are hollow spaces in the bones around your face. Your sinuses are located:  Around your eyes.  In the middle of your forehead.  Behind your nose.  In your cheekbones. Mucus normally drains out of your sinuses. When your nasal tissues become inflamed or swollen, mucus can become trapped or blocked. This allows bacteria, viruses, and fungi to grow, which leads to infection. Most infections of the sinuses are caused by a virus. Sinusitis can develop quickly. It can last for up to 4 weeks (acute) or for more than 12 weeks (chronic). Sinusitis often develops after a cold. What are the causes? This condition is caused by anything that creates swelling in the sinuses or stops  mucus from draining. This includes:  Allergies.  Asthma.  Infection from bacteria or viruses.  Deformities or blockages in your nose or sinuses.  Abnormal growths in the nose (nasal polyps).  Pollutants, such as chemicals or irritants in the air.  Infection from fungi (rare). What increases the risk? You are more likely to develop this condition if you:  Have a weak body defense system (immune system).  Do a lot of swimming or diving.  Overuse nasal sprays.  Smoke. What are the signs or symptoms? The main symptoms of this condition are pain and a feeling of pressure around the affected sinuses. Other symptoms include:  Stuffy nose or congestion.  Thick drainage from your nose.  Swelling and warmth over the affected sinuses.  Headache.  Upper toothache.  A cough that may get worse at night.  Extra mucus that collects in the throat or the back of the nose (postnasal drip).  Decreased sense of smell and taste.  Fatigue.  A fever.  Sore throat.  Bad breath. How is this diagnosed? This condition is diagnosed based on:  Your symptoms.  Your medical history.  A physical exam.  Tests to find out if your condition is acute or chronic. This may include: ? Checking your nose for nasal polyps. ? Viewing your sinuses using a device that has a light (endoscope). ? Testing for allergies or bacteria. ? Imaging tests, such as an MRI or CT scan. In rare cases, a bone biopsy may be done to rule out more serious types of fungal sinus disease. How is this treated? Treatment for sinusitis depends on the cause and whether your condition is chronic or acute.  If caused by a virus, your symptoms should go away on their own within 10 days. You may be  given medicines to relieve symptoms. They include: ? Medicines that shrink swollen nasal passages (topical intranasal decongestants). ? Medicines that treat allergies (antihistamines). ? A spray that eases inflammation of  the nostrils (topical intranasal corticosteroids). ? Rinses that help get rid of thick mucus in your nose (nasal saline washes).  If caused by bacteria, your health care provider may recommend waiting to see if your symptoms improve. Most bacterial infections will get better without antibiotic medicine. You may be given antibiotics if you have: ? A severe infection. ? A weak immune system.  If caused by narrow nasal passages or nasal polyps, you may need to have surgery. Follow these instructions at home: Medicines  Take, use, or apply over-the-counter and prescription medicines only as told by your health care provider. These may include nasal sprays.  If you were prescribed an antibiotic medicine, take it as told by your health care provider. Do not stop taking the antibiotic even if you start to feel better. Hydrate and humidify   Drink enough fluid to keep your urine pale yellow. Staying hydrated will help to thin your mucus.  Use a cool mist humidifier to keep the humidity level in your home above 50%.  Inhale steam for 10-15 minutes, 3-4 times a day, or as told by your health care provider. You can do this in the bathroom while a hot shower is running.  Limit your exposure to cool or dry air. Rest  Rest as much as possible.  Sleep with your head raised (elevated).  Make sure you get enough sleep each night. General instructions   Apply a warm, moist washcloth to your face 3-4 times a day or as told by your health care provider. This will help with discomfort.  Wash your hands often with soap and water to reduce your exposure to germs. If soap and water are not available, use hand sanitizer.  Do not smoke. Avoid being around people who are smoking (secondhand smoke).  Keep all follow-up visits as told by your health care provider. This is important. Contact a health care provider if:  You have a fever.  Your symptoms get worse.  Your symptoms do not improve within  10 days. Get help right away if:  You have a severe headache.  You have persistent vomiting.  You have severe pain or swelling around your face or eyes.  You have vision problems.  You develop confusion.  Your neck is stiff.  You have trouble breathing. Summary  Sinusitis is soreness and inflammation of your sinuses. Sinuses are hollow spaces in the bones around your face.  This condition is caused by nasal tissues that become inflamed or swollen. The swelling traps or blocks the flow of mucus. This allows bacteria, viruses, and fungi to grow, which leads to infection.  If you were prescribed an antibiotic medicine, take it as told by your health care provider. Do not stop taking the antibiotic even if you start to feel better.  Keep all follow-up visits as told by your health care provider. This is important. This information is not intended to replace advice given to you by your health care provider. Make sure you discuss any questions you have with your health care provider. Document Revised: 11/26/2017 Document Reviewed: 11/26/2017 Elsevier Patient Education  [place].

## 2020-03-04 ENCOUNTER — Telehealth: Payer: Self-pay | Admitting: *Deleted

## 2020-03-04 NOTE — Telephone Encounter (Signed)
Noted agree with plan of care symptoms not improving on antibiotic covid transmission high in community  Patient vaccinated.  Quarantine continuing.  HR Tim notified.

## 2020-03-04 NOTE — Telephone Encounter (Signed)
Pt was instructed to contact clinic today if sx were not improved after treatment was started 8/24. Pt called and reported that she called out of work today due to continued HA, body aches, chills, mild diarrhea, cough and low grade fever, 99.18f at last check this morning. Per NP previous recommendations, pt to complete Covid testing and quarantine.   Advised pt to follow 7 day symptomatic but vaccinated person quarantine per CDC recommendations. Sx started on 8/23, so testing scheduled for pt on sx Day 5, first available test date, at [facility], on 8/27 at 1100. Based on first full day of sx, Day 1 of quarantine considered 8/24. Reviewed drive up testing instructions and directions with pt, ie attend appt time, remain in vehicle, wear mask, results back in 2-3 days. Will f/u pt on 8/30 for anticipated results. If sx improving and no fever in previous >24hrs without antipyretics, pt to complete quarantine 8/30 and return to work 8/31. If positive test, no test results back, or sx not improving, will extend to 10 day quarantine.   Reviewed possible Covid sx including cough, ShOB, sinusitis sx, sore throat, fever/chills, body aches, fatigue, loss of taste/smell, GI sx n/v/d. Also reviewed same day/emergent eval/ER precautions of dizziness/syncope, confusion, blue tint to lips/face, severe ShOB/difficulty breathing.    Pt verbalizes understanding and agreement with plan of care. No further questions/concerns at this time. Pt reminded to contact clinic with any changes in sx or questions/concerns.

## 2020-03-06 ENCOUNTER — Encounter: Payer: Self-pay | Admitting: Registered Nurse

## 2020-03-06 NOTE — Telephone Encounter (Signed)
Telephone message left for patient following up on her symptoms to see if feeling any better today and checking to see if she has been notified of her covid test results.  Notified patient I can be reached via [email or web address] or [email or web address] and I would try to reach her again tomorrow via telephone otherwise to see her at work next week.

## 2020-03-07 NOTE — Telephone Encounter (Signed)
Patient sent me my chart message still having headache and nasal congestion.  Has not received covid test results yet.  Encouraged patient to increase nasal saline frequency and could trial afrin OTC 1 spray each nostril BID x 3 days for congestion.

## 2020-03-08 NOTE — Telephone Encounter (Signed)
Noted still pending receipt of covid test results and continuing quarantine at home.

## 2020-03-08 NOTE — Telephone Encounter (Signed)
Spoke with pt by phone this morning. She is feeling better overall. Denies complaints. She still has not received her Covid test results. Advised pt she needs negative result prior to RTW. If it returns negative today, she is able to RTW tomorrow as planned. If no results, will continue quarantine until received. Pt verbalizes understanding and agreement with this.

## 2020-03-09 NOTE — Telephone Encounter (Signed)
Patient returned call and notified RN Hildred Alamin test results negative.  Patient may RTW tomorrow HR Tim notified by [employer]

## 2020-03-09 NOTE — Telephone Encounter (Signed)
Pt still pending test results. She checked email and spam folder while on the phone with RN and no emails from test site. Their instructions gave an email address to email if no results after 5 days, so she is sending an email to that address now. Pt asymptomatic. Feels well today, no complaints.

## 2020-03-11 NOTE — Telephone Encounter (Signed)
Please verify patient returned to work as expected

## 2020-03-11 NOTE — Telephone Encounter (Signed)
Pt returned to work 03/10/20 as expected.

## 2020-03-11 NOTE — Telephone Encounter (Signed)
Noted symptoms resolved and patient RTW as expected.

## 2020-03-16 ENCOUNTER — Other Ambulatory Visit: Payer: Self-pay | Admitting: Family Medicine

## 2020-03-16 MED ORDER — ROSUVASTATIN CALCIUM 5 MG PO TABS: 5.0000 mg | ORAL_TABLET | ORAL | 1 refills | Status: DC

## 2020-03-22 ENCOUNTER — Other Ambulatory Visit: Payer: Self-pay | Admitting: Family Medicine

## 2020-03-23 ENCOUNTER — Ambulatory Visit: Payer: Self-pay | Admitting: Registered Nurse

## 2020-03-23 ENCOUNTER — Other Ambulatory Visit: Payer: Self-pay

## 2020-03-23 VITALS — BP 141/96 | HR 98 | Temp 98.6°F

## 2020-03-23 DIAGNOSIS — J4541 Moderate persistent asthma with (acute) exacerbation: Secondary | ICD-10-CM

## 2020-03-23 DIAGNOSIS — J019 Acute sinusitis, unspecified: Secondary | ICD-10-CM

## 2020-03-23 DIAGNOSIS — I10 Essential (primary) hypertension: Secondary | ICD-10-CM

## 2020-03-23 NOTE — Progress Notes (Signed)
Subjective:    Patient ID: Kelly Reed, female    DOB: March 03, 1966, 54 y.o.   MRN: [id number]  53y/o Caucasian established female pt c/o since Sunday, 2 days ago, sore throat, PND, nasal congestion. Had an asthma attack Sunday as well and has had chest tightness and cough since then. Has been using Vicks, steam showers, neti-pot rinses, and robitussin at home with some relief. Also had left over Prednisone at home and took 40mg  this morning.  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  She thinks she needs new Rx for prednisone.  Has albuterol inhaler doesn't need refill.  Asking if we can get her steroid injection medication in clinic as that works better for her than oral steroids.  Patient reported she will contact PCM regarding FMLA paperwork since [employer] contact limitation no FMLA paperwork completion allowed.  Last URI 03/02/2020 finished azithromycin pack and negative covid test; patient fully covid vaccinated and had covid infection 2020.  PMHx seasonal allergic rhinitis/sinusitis/bronchitis requiring steroids and immunotherapy injections     Review of Systems  Constitutional: Positive for fatigue. Negative for activity change, appetite change, chills, diaphoresis and fever.  HENT: Positive for congestion, postnasal drip, rhinorrhea, sinus pressure, sinus pain, sneezing and sore throat. Negative for trouble swallowing and voice change.   Eyes: Negative for photophobia and visual disturbance.  Respiratory: Positive for cough.   Cardiovascular: Negative for chest pain.  Gastrointestinal: Negative for diarrhea, nausea and vomiting.  Endocrine: Negative for cold intolerance and heat intolerance.  Genitourinary: Negative for difficulty urinating.  Musculoskeletal: Negative for back pain and myalgias.  Allergic/Immunologic: Positive for environmental allergies and immunocompromised state. Negative for food allergies.   Neurological: Negative for dizziness, tremors, seizures, syncope, speech difficulty, weakness, light-headedness and numbness.  Hematological: Negative for adenopathy. Does not bruise/bleed easily.  Psychiatric/Behavioral: Negative for agitation, confusion and sleep disturbance.       Objective:   Physical Exam Vitals and nursing note reviewed.  Constitutional:      General: She is awake. She is not in acute distress.    Appearance: Normal appearance. She is well-developed and well-groomed. She is ill-appearing. She is not toxic-appearing or diaphoretic.  HENT:     Head: Normocephalic and atraumatic.     Jaw: There is normal jaw occlusion. No trismus.     Salivary Glands: Right salivary gland is not diffusely enlarged or tender. Left salivary gland is not diffusely enlarged or tender.     Right Ear: Hearing, ear canal and external ear normal. A middle ear effusion is present. There is no impacted cerumen.     Left Ear: Hearing, ear canal and external ear normal. A middle ear effusion is present. There is no impacted cerumen.     Nose: Mucosal edema and rhinorrhea present. No nasal deformity, septal deviation, laceration or congestion.     Right Turbinates: Enlarged and swollen. Not pale.     Left Turbinates: Enlarged and swollen. Not pale.     Right Sinus: Maxillary sinus tenderness and frontal sinus tenderness present.     Left Sinus: Maxillary sinus tenderness and frontal sinus tenderness present.     Mouth/Throat:     Lips: Pink. No lesions.     Mouth: Mucous membranes are moist. Mucous membranes are not pale, not dry and not cyanotic. No lacerations, oral lesions or angioedema.     Dentition: No gum lesions.     Tongue: No lesions. Tongue does not deviate from midline.  Palate: No mass and lesions.     Pharynx: Uvula midline. Pharyngeal swelling and posterior oropharyngeal erythema present. No oropharyngeal exudate or uvula swelling.     Tonsils: No tonsillar exudate or  tonsillar abscesses.     Comments: Bilateral TMs air fluid level clear; cobblestoning posterior pharynx; bilateral nasal turbinates edema erythema clear discharge; frequent sneezing and occasional cough in exam room; intermittent nasal sniffing/congestion; mildly TTP frontal and maxillary sinuses bilaterally; bilateral allergic shiners Eyes:     General: Lids are normal. Vision grossly intact. Gaze aligned appropriately. Allergic shiner present. No visual field deficit or scleral icterus.       Right eye: No foreign body, discharge or hordeolum.        Left eye: No foreign body, discharge or hordeolum.     Extraocular Movements: Extraocular movements intact.     Right eye: Normal extraocular motion and no nystagmus.     Left eye: Normal extraocular motion and no nystagmus.     Conjunctiva/sclera: Conjunctivae normal.     Right eye: Right conjunctiva is not injected. No chemosis, exudate or hemorrhage.    Left eye: Left conjunctiva is not injected. No chemosis, exudate or hemorrhage.    Pupils: Pupils are equal, round, and reactive to light. Pupils are equal.     Right eye: Pupil is round and reactive.     Left eye: Pupil is round and reactive.  Neck:     Thyroid: No thyroid mass or thyromegaly.     Trachea: Trachea and phonation normal. No tracheal tenderness or tracheal deviation.  Cardiovascular:     Rate and Rhythm: Normal rate and regular rhythm.     Chest Wall: PMI is not displaced.     Pulses: Normal pulses.          Radial pulses are 2+ on the right side and 2+ on the left side.     Heart sounds: Normal heart sounds, S1 normal and S2 normal. No murmur heard.  No friction rub. No gallop.   Pulmonary:     Effort: Pulmonary effort is normal. No accessory muscle usage or respiratory distress.     Breath sounds: Normal breath sounds and air entry. No stridor, decreased air movement or transmitted upper airway sounds. No decreased breath sounds, wheezing, rhonchi or rales.     Comments:  Wearing cloth mask due to covid 19 pandemic; spoke full sentences without difficulty; rare mild intermittent cough noted in exam room Chest:     Chest wall: No tenderness.  Abdominal:     General: Abdomen is flat. There is no distension.     Palpations: Abdomen is soft.  Musculoskeletal:        General: No tenderness. Normal range of motion.     Right shoulder: Normal. No swelling, deformity, effusion or laceration. Normal range of motion.     Left shoulder: Normal. No swelling, deformity, effusion or laceration. Normal range of motion.     Right elbow: Normal. No swelling, deformity, effusion or lacerations. Normal range of motion.     Left elbow: Normal. No swelling, deformity, effusion or lacerations. Normal range of motion.     Right hand: Normal. No swelling, deformity or lacerations. Normal range of motion.     Left hand: Normal. No swelling, deformity or lacerations. Normal range of motion.     Cervical back: Normal, normal range of motion and neck supple. No swelling, edema, deformity, erythema, signs of trauma, lacerations, rigidity, tenderness or crepitus. No spinous process tenderness or  muscular tenderness. Normal range of motion.     Thoracic back: Normal. No swelling, edema, deformity, signs of trauma or lacerations. Normal range of motion.     Lumbar back: Normal. No swelling, edema, deformity, signs of trauma or lacerations. Normal range of motion.     Right hip: Normal. No deformity or lacerations. Normal range of motion.     Left hip: Normal. No deformity or lacerations. Normal range of motion.     Right knee: Normal. No swelling, deformity or lacerations. Normal range of motion.     Left knee: Normal. No swelling, deformity or lacerations. Normal range of motion.  Lymphadenopathy:     Head:     Right side of head: No submental, submandibular, tonsillar, preauricular, posterior auricular or occipital adenopathy.     Left side of head: No submental, submandibular, tonsillar,  preauricular, posterior auricular or occipital adenopathy.     Cervical: No cervical adenopathy.     Right cervical: No superficial, deep or posterior cervical adenopathy.    Left cervical: No superficial, deep or posterior cervical adenopathy.  Skin:    General: Skin is warm and dry.     Capillary Refill: Capillary refill takes less than 2 seconds.     Coloration: Skin is not ashen, cyanotic, jaundiced, mottled, pale or sallow.     Findings: No abrasion, abscess, acne, bruising, burn, ecchymosis, erythema, signs of injury, laceration, lesion, petechiae, rash or wound.     Nails: There is no clubbing.  Neurological:     General: No focal deficit present.     Mental Status: She is alert and oriented to person, place, and time. Mental status is at baseline. She is not disoriented.     GCS: GCS eye subscore is 4. GCS verbal subscore is 5. GCS motor subscore is 6.     Cranial Nerves: Cranial nerves are intact. No cranial nerve deficit, dysarthria or facial asymmetry.     Sensory: Sensation is intact. No sensory deficit.     Motor: Motor function is intact. No weakness, tremor, atrophy, abnormal muscle tone or seizure activity.     Coordination: Coordination is intact. Coordination normal.     Gait: Gait is intact. Gait normal.     Comments: Gait sure and steady in clinic; bilateral hand grasp equal 5/5; on/off exam table and in/out of chair without difficulty  Psychiatric:        Attention and Perception: Attention and perception normal.        Mood and Affect: Mood and affect normal.        Speech: Speech normal.        Behavior: Behavior normal. Behavior is cooperative.        Thought Content: Thought content normal.        Cognition and Memory: Cognition and memory normal.        Judgment: Judgment normal.           Assessment & Plan:  A-acute recurrent frontal sinusitis; acute bronchitis recurrent secondary to pollen; seasonal allergic rhinitis, elevated blood pressure with  history of hypertension  P-Patient may use normal saline nasal spray 2 sprays each nostril q2h wa as needed. flonase 20mcg 1 spray each nostril BID at home.  Patient denied personal or family history of ENT cancer.  OTC antihistamine of choice xyzal 5mg  po daily or benadryl 25mg  po TID prn rhinitis.  Refused phenylephrine at this time.  Avoid triggers if possible.  Shower prior to bedtime if exposed to triggers.  If allergic dust/dust mites recommend mattress/pillow covers/encasements; washing linens, vacuuming, sweeping, dusting weekly.  Call or return to clinic as needed if these symptoms worsen or fail to improve as anticipated. Patient verbalized understanding of instructions, agreed with plan of care and had no further questions at this time.  P2:  Avoidance and hand washing.  Continue flonase 1 spray each nostril BID, saline 2 sprays each nostril q2h wa prn congestion.  Start prednisone taper 10mg  take 4 x 4 days with breakfast then 3 tabs x 2 days 2 tabs x 2 days and 1 tab x 2 days #21 RF0 dispensed from [facility] to patient (patient had additional 21 tabs at home left over from previous Rx to have required 28 tabs).   Denied personal or family history of ENT cancer.  Shower BID especially prior to bed. No evidence of systemic bacterial infection, non toxic and well hydrated.  I do not see where any further testing or imaging is necessary at this time.   I will suggest supportive care, rest, good hygiene and encourage the patient to take adequate fluids.  The patient is to return to clinic or EMERGENCY ROOM if symptoms worsen or change significantly.  Exitcare handout on sinusitis and sinus rinse.  Patient verbalized agreement and understanding of treatment plan and had no further questions at this time.   P2:  Hand washing and cover cough  Continue mask wear. Cough lozenges po q2h prn cough given 8 UD from clinic stock.  Prednisone taper 10mg  (40x4d/30x2d/20x2d10x2d) po daily with breakfast #21 RF0  dispensed from [facility].  Discussed possible side effects increased/decreased appetite, difficulty sleeping, increased blood sugar, increased blood pressure and heart rate.  Albuterol MDI 70mcg 1-2 puffs po q4-6h prn protracted cough/wheeze at home side effect increased heart rate. Bronchitis simple, community acquired, may have started as viral (probably respiratory syncytial, parainfluenza, influenza, or adenovirus), but now evidence of acute purulent bronchitis with resultant bronchial edema and mucus formation.  Viruses are the most common cause of bronchial inflammation in otherwise healthy adults with acute bronchitis.  The appearance of sputum is not predictive of whether a bacterial infection is present.  Purulent sputum is most often caused by viral infections.  There are a small portion of those caused by non-viral agents being Mycoplama pneumonia.  Microscopic examination or C&S of sputum in the healthy adult with acute bronchitis is generally not helpful (usually negative or normal respiratory flora) other considerations being cough from upper respiratory tract infections, sinusitis or allergic syndromes (mild asthma or viral pneumonia).  Differential Diagnoses:  reactive airway disease (asthma, allergic aspergillosis (eosinophilia), chronic bronchitis, respiratory infection (sinusitis, common cold, pneumonia), congestive heart failure, reflux esophagitis, bronchogenic tumor, aspiration syndromes and/or exposure to pulmonary irritants/smoke.    Discussed with patient covid virus is common in community at this time.  Continue to shelter at home when not required at work.   Without high fever, severe dyspnea, lack of physical findings or other risk factors, I will hold on a chest radiograph and CBC at this time.  I discussed that approximately 50% of patients with acute bronchitis have a cough that lasts up to three weeks, and 25% for over a month.  Tylenol 500mg  one to two tablets every four to six hours as  needed for fever or myalgias.  No aspirin. Exitcare handout on asthma attack prevention, bronchitis and inhaler use.  ER if hemopthysis, SOB, worst chest pain of life.   Patient instructed to follow up in 48 hours if  no improvement in symptoms with plan of care.  Patient verbalized agreement and understanding of treatment plan and had no further questions at this time.  P2:  hand washing and cover cough  Has been working in [employer].  Using OTC cough medicine with phenylephrine/dextromethorphan, took 40mg  oral prednisone with breakfast this am; acute URI and elevated blood pressure.   ER if chest pain, worst headache of life, dyspnea or visual changes for re-evaluation.  See RN Hildred Alamin later this month when feeling better for repeat BP check and off oral steroids.

## 2020-03-23 NOTE — Patient Instructions (Signed)
How to Perform a Sinus Rinse A sinus rinse is a home treatment that is used to rinse your sinuses with a sterile mixture of salt and water (saline solution). Sinuses are air-filled spaces in your skull behind the bones of your face and forehead that open into your nasal cavity. A sinus rinse can help to clear mucus, dirt, dust, or pollen from your nasal cavity. You may do a sinus rinse when you have a cold, a virus, nasal allergy symptoms, a sinus infection, or stuffiness in your nose or sinuses. Talk with your health care provider about whether a sinus rinse might help you. What are the risks? A sinus rinse is generally safe and effective. However, there are a few risks, which include:  A burning sensation in your sinuses. This may happen if you do not make the saline solution as directed. Be sure to follow all directions when making the saline solution.  Nasal irritation.  Infection from contaminated water. This is rare, but possible. Do not do a sinus rinse if you have had ear or nasal surgery, ear infection, or blocked ears. Supplies needed:  Saline solution or powder.  Distilled or sterile water may be needed to mix with saline powder. ? You may use boiled and cooled tap water. Boil tap water for 5 minutes; cool until it is lukewarm. Use within 24 hours. ? Do not use regular tap water to mix with the saline solution.  Neti pot or nasal rinse bottle. These supplies release the saline solution into your nose and through your sinuses. Neti pots and nasal rinse bottles can be purchased at [employer], a health food store, or online. How to perform a sinus rinse  1. Wash your hands with soap and water. 2. Wash your device according to the directions that came with the product and then dry it. 3. Use the solution that comes with your product or one that is sold separately in stores. Follow the mixing directions on the package if you need to mix with sterile or distilled  water. 4. Fill the device with the amount of saline solution noted in the device instructions. 5. Stand over a sink and tilt your head sideways over the sink. 6. Place the spout of the device in your upper nostril (the one closer to the ceiling). 7. Gently pour or squeeze the saline solution into your nasal cavity. The liquid should drain out from the lower nostril if you are not too congested. 8. While rinsing, breathe through your open mouth. 9. Gently blow your nose to clear any mucus and rinse solution. Blowing too hard may cause ear pain. 10. Repeat in your other nostril. 11. Clean and rinse your device with clean water and then air-dry it. Talk with your health care provider or pharmacist if you have questions about how to do a sinus rinse. Summary  A sinus rinse is a home treatment that is used to rinse your sinuses with a sterile mixture of salt and water (saline solution).  A sinus rinse is generally safe and effective. Follow all instructions carefully.  Before doing a sinus rinse, talk with your health care provider about whether it would be helpful for you. This information is not intended to replace advice given to you by your health care provider. Make sure you discuss any questions you have with your health care provider. Document Revised: 04/23/2017 Document Reviewed: 04/23/2017 Elsevier Patient Education  [place]. Sinusitis, Adult Sinusitis is inflammation of your sinuses. Sinuses  are hollow spaces in the bones around your face. Your sinuses are located:  Around your eyes.  In the middle of your forehead.  Behind your nose.  In your cheekbones. Mucus normally drains out of your sinuses. When your nasal tissues become inflamed or swollen, mucus can become trapped or blocked. This allows bacteria, viruses, and fungi to grow, which leads to infection. Most infections of the sinuses are caused by a virus. Sinusitis can develop quickly. It can last for up to 4  weeks (acute) or for more than 12 weeks (chronic). Sinusitis often develops after a cold. What are the causes? This condition is caused by anything that creates swelling in the sinuses or stops mucus from draining. This includes:  Allergies.  Asthma.  Infection from bacteria or viruses.  Deformities or blockages in your nose or sinuses.  Abnormal growths in the nose (nasal polyps).  Pollutants, such as chemicals or irritants in the air.  Infection from fungi (rare). What increases the risk? You are more likely to develop this condition if you:  Have a weak body defense system (immune system).  Do a lot of swimming or diving.  Overuse nasal sprays.  Smoke. What are the signs or symptoms? The main symptoms of this condition are pain and a feeling of pressure around the affected sinuses. Other symptoms include:  Stuffy nose or congestion.  Thick drainage from your nose.  Swelling and warmth over the affected sinuses.  Headache.  Upper toothache.  A cough that may get worse at night.  Extra mucus that collects in the throat or the back of the nose (postnasal drip).  Decreased sense of smell and taste.  Fatigue.  A fever.  Sore throat.  Bad breath. How is this diagnosed? This condition is diagnosed based on:  Your symptoms.  Your medical history.  A physical exam.  Tests to find out if your condition is acute or chronic. This may include: ? Checking your nose for nasal polyps. ? Viewing your sinuses using a device that has a light (endoscope). ? Testing for allergies or bacteria. ? Imaging tests, such as an MRI or CT scan. In rare cases, a bone biopsy may be done to rule out more serious types of fungal sinus disease. How is this treated? Treatment for sinusitis depends on the cause and whether your condition is chronic or acute.  If caused by a virus, your symptoms should go away on their own within 10 days. You may be given medicines to relieve  symptoms. They include: ? Medicines that shrink swollen nasal passages (topical intranasal decongestants). ? Medicines that treat allergies (antihistamines). ? A spray that eases inflammation of the nostrils (topical intranasal corticosteroids). ? Rinses that help get rid of thick mucus in your nose (nasal saline washes).  If caused by bacteria, your health care provider may recommend waiting to see if your symptoms improve. Most bacterial infections will get better without antibiotic medicine. You may be given antibiotics if you have: ? A severe infection. ? A weak immune system.  If caused by narrow nasal passages or nasal polyps, you may need to have surgery. Follow these instructions at home: Medicines  Take, use, or apply over-the-counter and prescription medicines only as told by your health care provider. These may include nasal sprays.  If you were prescribed an antibiotic medicine, take it as told by your health care provider. Do not stop taking the antibiotic even if you start to feel better. Hydrate and humidify  Drink enough fluid to keep your urine pale yellow. Staying hydrated will help to thin your mucus.  Use a cool mist humidifier to keep the humidity level in your home above 50%.  Inhale steam for 10-15 minutes, 3-4 times a day, or as told by your health care provider. You can do this in the bathroom while a hot shower is running.  Limit your exposure to cool or dry air. Rest  Rest as much as possible.  Sleep with your head raised (elevated).  Make sure you get enough sleep each night. General instructions   Apply a warm, moist washcloth to your face 3-4 times a day or as told by your health care provider. This will help with discomfort.  Wash your hands often with soap and water to reduce your exposure to germs. If soap and water are not available, use hand sanitizer.  Do not smoke. Avoid being around people who are smoking (secondhand smoke).  Keep all  follow-up visits as told by your health care provider. This is important. Contact a health care provider if:  You have a fever.  Your symptoms get worse.  Your symptoms do not improve within 10 days. Get help right away if:  You have a severe headache.  You have persistent vomiting.  You have severe pain or swelling around your face or eyes.  You have vision problems.  You develop confusion.  Your neck is stiff.  You have trouble breathing. Summary  Sinusitis is soreness and inflammation of your sinuses. Sinuses are hollow spaces in the bones around your face.  This condition is caused by nasal tissues that become inflamed or swollen. The swelling traps or blocks the flow of mucus. This allows bacteria, viruses, and fungi to grow, which leads to infection.  If you were prescribed an antibiotic medicine, take it as told by your health care provider. Do not stop taking the antibiotic even if you start to feel better.  Keep all follow-up visits as told by your health care provider. This is important. This information is not intended to replace advice given to you by your health care provider. Make sure you discuss any questions you have with your health care provider. Document Revised: 11/26/2017 Document Reviewed: 11/26/2017 Elsevier Patient Education  [place]. Acute Bronchitis, Adult  Acute bronchitis is sudden or acute swelling of the air tubes (bronchi) in the lungs. Acute bronchitis causes these tubes to fill with mucus, which can make it hard to breathe. It can also cause coughing or wheezing. In adults, acute bronchitis usually goes away within 2 weeks. A cough caused by bronchitis may last up to 3 weeks. Smoking, allergies, and asthma can make the condition worse. What are the causes? This condition can be caused by germs and by substances that irritate the lungs, including:  Cold and flu viruses. The most common cause of this condition is the virus that  causes the common cold.  Bacteria.  Substances that irritate the lungs, including: ? Smoke from cigarettes and other forms of tobacco. ? Dust and pollen. ? Fumes from chemical products, gases, or burned fuel. ? Other materials that pollute indoor or outdoor air.  Close contact with someone who has acute bronchitis. What increases the risk? The following factors may make you more likely to develop this condition:  A weak body's defense system, also called the immune system.  A condition that affects your lungs and breathing, such as asthma. What are the signs or symptoms? Common symptoms  of this condition include:  Lung and breathing problems, such as: ? Coughing. This may bring up clear, yellow, or green mucus from your lungs (sputum). ? Wheezing. ? Having too much mucus in your lungs (chest congestion). ? Having shortness of breath.  A fever.  Chills.  Aches and pains, including: ? Tightness in your chest and other body aches. ? A sore throat. How is this diagnosed? This condition is usually diagnosed based on:  Your symptoms and medical history.  A physical exam. You may also have other tests, including tests to rule out other conditions, such pneumonia. These tests include:  A test of lung function.  Test of a mucus sample to look for the presence of bacteria.  Tests to check the oxygen level in your blood.  Blood tests.  Chest X-ray. How is this treated? Most cases of acute bronchitis clear up over time without treatment. Your health care provider may recommend:  Drinking more fluids. This can thin your mucus, which may improve your breathing.  Taking a medicine for a fever or cough.  Using a device that gets medicine into your lungs (inhaler) to help improve breathing and control coughing.  Using a vaporizer or a humidifier. These are machines that add water to the air to help you breathe better. Follow these instructions at home: Activity  Get  plenty of rest.  Return to your normal activities as told by your health care provider. Ask your health care provider what activities are safe for you. Lifestyle  Drink enough fluid to keep your urine pale yellow.  Do not drink alcohol.  Do not use any products that contain nicotine or tobacco, such as cigarettes, e-cigarettes, and chewing tobacco. If you need help quitting, ask your health care provider. Be aware that: ? Your bronchitis will get worse if you smoke or breathe in other people's smoke (secondhand smoke). ? Your lungs will heal faster if you quit smoking. General instructions   Take over-the-counter and prescription medicines only as told by your health care provider.  Use an inhaler, vaporizer, or humidifier as told by your health care provider.  If you have a sore throat, gargle with a salt-water mixture 3-4 times a day or as needed. To make a salt-water mixture, completely dissolve -1 tsp (3-6 g) of salt in 1 cup (237 mL) of warm water.  Keep all follow-up visits as told by your health care provider. This is important. How is this prevented? To lower your risk of getting this condition again:  Wash your hands often with soap and water. If soap and water are not available, use hand sanitizer.  Avoid contact with people who have cold symptoms.  Try not to touch your mouth, nose, or eyes with your hands.  Avoid places where there are fumes from chemicals. Breathing these fumes will make your condition worse.  Get the flu shot every year. Contact a health care provider if:  Your symptoms do not improve after 2 weeks of treatment.  You vomit more than once or twice.  You have symptoms of dehydration such as: ? Dark urine. ? Dry skin or eyes. ? Increased thirst. ? Headaches. ? Confusion. ? Muscle cramps. Get help right away if you:  Cough up blood.  Feel pain in your chest.  Have severe shortness of breath.  Faint or keep feeling like you are going  to faint.  Have a severe headache.  Have fever or chills that get worse. These symptoms may represent a  serious problem that is an emergency. Do not wait to see if the symptoms will go away. Get medical help right away. Call your local emergency services (911 in the [place] Do not drive yourself to the hospital. Summary  Acute bronchitis is sudden (acute) inflammation of the air tubes (bronchi) between the windpipe and the lungs. In adults, acute bronchitis usually goes away within 2 weeks, although coughing may last 3 weeks or longer  Take over-the-counter and prescription medicines only as told by your health care provider.  Drink enough fluid to keep your urine pale yellow.  Contact a health care provider if your symptoms do not improve after 2 weeks of treatment.  Get help right away if you cough up blood, faint, or have chest pain or shortness of breath. This information is not intended to replace advice given to you by your health care provider. Make sure you discuss any questions you have with your health care provider. Document Revised: 03/10/2019 Document Reviewed: 01/17/2019 Elsevier Patient Education  [place]. Albuterol inhalation aerosol What is this medicine? ALBUTEROL (al [place]) is a bronchodilator. It helps open up the airways in your lungs to make it easier to breathe. This medicine is used to treat and to prevent bronchospasm. This medicine may be used for other purposes; ask your health care provider or pharmacist if you have questions. COMMON BRAND NAME(S): Proair HFA, Proventil, Proventil HFA, Respirol, Ventolin, Ventolin HFA What should I tell my health care provider before I take this medicine? They need to know if you have any of the following conditions:  diabetes  heart disease or irregular heartbeat  high blood pressure  pheochromocytoma  seizures  thyroid disease  an unusual or allergic reaction to albuterol, levalbuterol, other  medicines, foods, dyes, or preservatives  pregnant or trying to get pregnant  breast-feeding How should I use this medicine? This medicine is for inhalation through the mouth. Follow the directions on your prescription label. Take your medicine at regular intervals. Do not use more often than directed. Make sure that you are using your inhaler correctly. Ask your doctor or health care provider if you have any questions. Talk to your pediatrician regarding the use of this medicine in children. While this drug may be prescribed for children as young as 4 years for selected conditions, precautions do apply. Overdosage: If you think you have taken too much of this medicine contact a poison control center or emergency room at once. NOTE: This medicine is only for you. Do not share this medicine with others. What if I miss a dose? If you miss a dose, use it as soon as you can. If it is almost time for your next dose, use only that dose. Do not use double or extra doses. What may interact with this medicine?  anti-infectives like chloroquine and pentamidine  caffeine  cisapride  diuretics  medicines for colds  medicines for depression or for emotional or psychotic conditions  medicines for weight loss including some herbal products  methadone  some antibiotics like clarithromycin, erythromycin, levofloxacin, and linezolid  some heart medicines  steroid hormones like dexamethasone, cortisone, hydrocortisone  theophylline  thyroid hormones This list may not describe all possible interactions. Give your health care provider a list of all the medicines, herbs, non-prescription drugs, or dietary supplements you use. Also tell them if you smoke, drink alcohol, or use illegal drugs. Some items may interact with your medicine. What should I watch for while using this medicine?  Tell your doctor or health care professional if your symptoms do not improve. Do not use extra albuterol. If your  asthma or bronchitis gets worse while you are using this medicine, call your doctor right away. If your mouth gets dry try chewing sugarless gum or sucking hard candy. Drink water as directed. What side effects may I notice from receiving this medicine? Side effects that you should report to your doctor or health care professional as soon as possible:  allergic reactions like skin rash, itching or hives, swelling of the face, lips, or tongue  breathing problems  chest pain  feeling faint or lightheaded, falls  high blood pressure  irregular heartbeat  fever  muscle cramps or weakness  pain, tingling, numbness in the hands or feet  vomiting Side effects that usually do not require medical attention (report to your doctor or health care professional if they continue or are bothersome):  changes in taste  cough  dry mouth  headache  nervousness or trembling  stomach upset  stuffy or runny nose  throat irritation  trouble sleeping This list may not describe all possible side effects. Call your doctor for medical advice about side effects. You may report side effects to FDA at 1-800-FDA-1088. Where should I keep my medicine? Keep out of the reach of children. Store Proventil HFA and ProAir HFA at room temperature between 15 and 25 degrees C (59 and 77 degrees F). Store Ventolin HFA at room temperature between 20 and 25 degrees C (68 and 77 degrees F); it may be stored between 15 and 30 degrees C (59 and 86 degrees F) on occasion. The contents are under pressure and may burst when exposed to heat or flame. Do not freeze. This medicine does not work as well if it is too cold. Throw away the inhaler when the dose counter displays "0" or after the expiration date on the package, whichever comes first. Ventolin HFA should be thrown away 12 months after removing it from the foil pouch. NOTE: This sheet is a summary. It may not cover all possible information. If you have questions  about this medicine, talk to your doctor, pharmacist, or health care provider.  2020 Elsevier/Gold Standard (2018-10-10 12:46:54) Asthma Attack Prevention, Adult Although you may not be able to control the fact that you have asthma, you can take actions to prevent episodes of asthma (asthma attacks). These actions include:  Creating a written plan for managing and treating your asthma attacks (asthma action plan).  Monitoring your asthma.  Avoiding things that can irritate your airways or make your asthma symptoms worse (asthma triggers).  Taking your medicines as directed.  Acting quickly if you have signs or symptoms of an asthma attack. What are some ways to prevent an asthma attack? Create a plan Work with your health care provider to create an asthma action plan. This plan should include:  A list of your asthma triggers and how to avoid them.  A list of symptoms that you experience during an asthma attack.  Information about when to take medicine and how much medicine to take.  Information to help you understand your peak flow measurements.  Contact information for your health care providers.  Daily actions that you can take to control asthma. Monitor your asthma To monitor your asthma:  Use your peak flow meter every morning and every evening for 2-3 weeks. Record the results in a journal. A drop in your peak flow numbers on one or more days may  mean that you are starting to have an asthma attack, even if you are not having symptoms.  When you have asthma symptoms, write them down in a journal.  Avoid asthma triggers Work with your health care provider to find out what your asthma triggers are. This can be done by:  Being tested for allergies.  Keeping a journal that notes when asthma attacks occur and what may have contributed to them.  Asking your health care provider whether other medical conditions make your asthma worse. Common asthma triggers  include:  Dust.  Smoke. This includes campfire smoke and secondhand smoke from tobacco products.  Pet dander.  Trees, grasses or pollens.  Very cold, dry, or humid air.  Mold.  Foods that contain high amounts of sulfites.  Strong smells.  Engine exhaust and air pollution.  Aerosol sprays and fumes from household cleaners.  Household pests and their droppings, including dust mites and cockroaches.  Certain medicines, including NSAIDs. Once you have determined your asthma triggers, take steps to avoid them. Depending on your triggers, you may be able to reduce the chance of an asthma attack by:  Keeping your home clean. Have someone dust and vacuum your home for you 1 or 2 times a week. If possible, have them use a high-efficiency particulate arrestance (HEPA) vacuum.  Washing your sheets weekly in hot water.  Using allergy-proof mattress covers and casings on your bed.  Keeping pets out of your home.  Taking care of mold and water problems in your home.  Avoiding areas where people smoke.  Avoiding using strong perfumes or odor sprays.  Avoid spending a lot of time outdoors when pollen counts are high and on very windy days.  Talking with your health care provider before stopping or starting any new medicines. Medicines Take over-the-counter and prescription medicines only as told by your health care provider. Many asthma attacks can be prevented by carefully following your medicine schedule. Taking your medicines correctly is especially important when you cannot avoid certain asthma triggers. Even if you are doing well, do not stop taking your medicine and do not take less medicine. Act quickly If an asthma attack happens, acting quickly can decrease how severe it is and how long it lasts. Take these actions:  Pay attention to your symptoms. If you are coughing, wheezing, or having difficulty breathing, do not wait to see if your symptoms go away on their own. Follow  your asthma action plan.  If you have followed your asthma action plan and your symptoms are not improving, call your health care provider or seek immediate medical care at the nearest hospital. It is important to write down how often you need to use your fast-acting rescue inhaler. You can track how often you use an inhaler in your journal. If you are using your rescue inhaler more often, it may mean that your asthma is not under control. Adjusting your asthma treatment plan may help you to prevent future asthma attacks and help you to gain better control of your condition. How can I prevent an asthma attack when I exercise? Exercise is a common asthma trigger. To prevent asthma attacks during exercise:  Follow advice from your health care provider about whether you should use your fast-acting inhaler before exercising. Many people with asthma experience exercise-induced bronchoconstriction (EIB). This condition often worsens during vigorous exercise in cold, humid, or dry environments. Usually, people with EIB can stay very active by using a fast-acting inhaler before exercising.  Avoid exercising  outdoors in very cold or humid weather.  Avoid exercising outdoors when pollen counts are high.  Warm up and cool down when exercising.  Stop exercising right away if asthma symptoms start. Consider taking part in exercises that are less likely to cause asthma symptoms such as:  Indoor swimming.  Biking.  Walking.  Hiking.  Playing football. This information is not intended to replace advice given to you by your health care provider. Make sure you discuss any questions you have with your health care provider. Document Revised: 06/08/2017 Document Reviewed: 12/11/2015 Elsevier Patient Education  2020 [employer].

## 2020-04-08 ENCOUNTER — Ambulatory Visit (INDEPENDENT_AMBULATORY_CARE_PROVIDER_SITE_OTHER): Payer: PRIVATE HEALTH INSURANCE | Admitting: Family Medicine

## 2020-04-08 ENCOUNTER — Other Ambulatory Visit: Payer: Self-pay

## 2020-04-08 ENCOUNTER — Encounter: Payer: Self-pay | Admitting: Family Medicine

## 2020-04-08 VITALS — BP 128/84 | HR 94 | Temp 98.0°F | Ht 71.0 in | Wt 223.4 lb

## 2020-04-08 DIAGNOSIS — E1165 Type 2 diabetes mellitus with hyperglycemia: Secondary | ICD-10-CM

## 2020-04-08 DIAGNOSIS — J4540 Moderate persistent asthma, uncomplicated: Secondary | ICD-10-CM

## 2020-04-08 DIAGNOSIS — E7800 Pure hypercholesterolemia, unspecified: Secondary | ICD-10-CM

## 2020-04-08 DIAGNOSIS — Z23 Encounter for immunization: Secondary | ICD-10-CM

## 2020-04-08 DIAGNOSIS — I10 Essential (primary) hypertension: Secondary | ICD-10-CM | POA: Diagnosis not present

## 2020-04-08 DIAGNOSIS — J302 Other seasonal allergic rhinitis: Secondary | ICD-10-CM

## 2020-04-08 NOTE — Patient Instructions (Signed)
° ° ° °  If you have lab work done today you will be contacted with your lab results within the next 2 weeks.  If you have not heard from us then please contact us. The fastest way to get your results is to register for My Chart. ° ° °IF you received an x-ray today, you will receive an invoice from [facility]. Please contact [facility] at [phone] with questions or concerns regarding your invoice.  ° °IF you received labwork today, you will receive an invoice from [facility]. Please contact [facility] at [phone] with questions or concerns regarding your invoice.  ° °Our billing staff will not be able to assist you with questions regarding bills from these companies. ° °You will be contacted with the lab results as soon as they are available. The fastest way to get your results is to activate your My Chart account. Instructions are located on the last page of this paperwork. If you have not heard from us regarding the results in 2 weeks, please contact this office. °  ° ° ° °

## 2020-04-08 NOTE — Progress Notes (Signed)
9/30/20215:16 PM  Kelly Reed 1965-07-12, 54 y.o., female [id number]  Chief Complaint  Patient presents with  . Follow-up    Diabetes/FLMA pap    HPI:   Patient is a 54 y.o. female with past medical history significant for  asthma, HTN, DM2, seasonal allergies, HLP,anxietywho presents todayfor routine followup  Last seen in may 2021 - stopped [place] for recurrent elevated lipase with epigastric pain, changed to crestor 3 x week 2/2 mylagia on daily lipitor  Patient states that she is doing OK GI issues completely resolved with d/c januvia Her LFM efforts have not been that great of recent Having very minimal nocturnal muscle cramps on crestor Her allergies and asthma have been flaring up of recent Has allergy immunotherapy injection once a week Seen in aug and sept for acute resp illnesses, this is not unusual for her during her allergy seasons Asthma worse during her allergy seasons (spring and fall), also since 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 Works in a warehouse Last albuterol earlier today  Lab Results  Component Value Date   HGBA1C 6.8 (A) 10/31/2019   HGBA1C 6.7 (H) 06/26/2019   HGBA1C 6.9 (A) 05/02/2019   Lab Results  Component Value Date   GLUF 178 (A) 03/20/2012   MICROALBUR 0.3 09/16/2015   LDLCALC 134 (H) 02/23/2020   CREATININE 0.73 02/23/2020   Wt Readings from Last 3 Encounters:  04/08/20 223 lb 6.4 oz (101.3 kg)  01/09/20 218 lb (98.9 kg)  12/31/19 218 lb 9.6 oz (99.2 kg)   BP Readings from Last 3 Encounters:  04/08/20 (!) 155/80  03/23/20 (!) 141/96  03/02/20 (!) 127/91    Depression screen PHQ 2/9 04/08/2020 10/31/2019 08/15/2019  Decreased Interest 0 0 0  Down, Depressed, Hopeless 0 0 0  PHQ - 2 Score 0 0 0    Fall Risk  04/08/2020 10/31/2019 08/15/2019 05/09/2019 05/02/2019  Falls  in the past year? 0 0 0 0 0  Number falls in past yr: 0 0 0 0 0  Injury with Fall? 0 0 0 0 0  Comment - - - - -  Follow up Falls evaluation completed Falls evaluation completed Falls evaluation completed - -     Allergies  Allergen Reactions  . Adhesive [Tape] Rash and Other (See Comments)    Peeling of skin    Prior to Admission medications   Medication Sig Start Date End Date Taking? Authorizing Provider  ACCU-CHEK FASTCLIX LANCETS MISC U UTD TO CHECK BG TID 05/11/18  Yes [provider]  ACCU-CHEK GUIDE test strip U UTD TO CHECK BG TID 04/30/18  Yes [provider]  albuterol (VENTOLIN HFA) 108 (90 Base) MCG/ACT inhaler INHALE 2 PUFFS BY MOUTH INTO THE LUNGS EVERY 6 HOURS AS NEEDED WHEEZING OR SHORTNESS OF BREATH 04/29/19  Yes Wendall Mola, NP  aspirin EC 81 MG tablet Take 1 tablet (81 mg total) by mouth daily. 08/07/14  Yes Shawnee Knapp, MD  AUVI-Q 0.3 MG/0.3ML SOAJ injection Inject into the muscle as directed. 03/05/20  Yes [provider]  Blood Glucose Monitoring Suppl (ACCU-CHEK GUIDE ME) w/Device KIT U UTD TO CHECK BG TID 05/11/18  Yes [provider]  Epinastine HCl 0.05 % ophthalmic solution Place 1 drop into both eyes 2 (two) times daily. 09/21/16  Yes Shawnee Knapp, MD  levocetirizine (XYZAL) 5 MG tablet Take 1 tablet by mouth every evening.  03/08/17  Yes [provider]  losartan (COZAAR) 100 MG tablet Take 1 tablet (100 mg total) by mouth daily. TAKE 1 TABLET BY MOUTH DAILY 10/31/19  Yes Rutherford Guys, MD  MAGNESIUM PO Take by mouth.   Yes [provider]  metFORMIN (GLUCOPHAGE) 1000 MG tablet TAKE 1 TABLET(1000 MG) BY MOUTH TWICE DAILY WITH A MEAL 03/22/20  Yes Rutherford Guys, MD  montelukast (SINGULAIR) 10 MG tablet TAKE 1 TABLET(10 MG) BY MOUTH AT BEDTIME 04/21/19  Yes Rutherford Guys, MD  Multiple Vitamins-Minerals (CENTRUM ADULTS PO) Take by mouth.   Yes [provider]  NON FORMULARY OTC stool softener-  uses every other day per pt   Yes [provider]  omeprazole (PRILOSEC) 20 MG capsule Take 1 capsule (20 mg total) by mouth 2 (two) times daily before a meal. 11/28/19  Yes Rutherford Guys, MD  rosuvastatin (CRESTOR) 5 MG tablet Take 1 tablet (5 mg total) by mouth 3 (three) times a week. 03/17/20 06/15/20 Yes Rutherford Guys, MD  sodium chloride (OCEAN) 0.65 % SOLN nasal spray Place 1 spray into both nostrils every 2 (two) hours while awake. 04/15/19  Yes Betancourt, Aura Fey, NP  vitamin C (ASCORBIC ACID) 500 MG tablet Take 500 mg by mouth daily.   Yes [provider]  Grant Ruts INHUB 250-50 MCG/DOSE AEPB Inhale 1 puff into the lungs 2 (two) times daily. 03/27/19  Yes [provider]  fluticasone (FLONASE) 50 MCG/ACT nasal spray Place 1 spray into both nostrils 2 (two) times daily. 04/15/19 03/23/20  Betancourt, Aura Fey, NP  PARoxetine (PAXIL) 30 MG tablet Take 1 tablet (30 mg total) by mouth daily. Patient not taking: Reported on 04/08/2020 12/26/19   Rutherford Guys, MD  venlafaxine XR (EFFEXOR-XR) 150 MG 24 hr capsule Take 1 capsule (150 mg total) by mouth daily with breakfast. Take one capsule daily Patient not taking: Reported on 04/08/2020 01/31/19   Rutherford Guys, MD    Past Medical History:  Diagnosis Date  . Allergy   . Anemia   . Anxiety   . Asthma   . COVID-19 04/29/2019  . COVID-19 virus detected 04/11/2019  . Diabetes mellitus type II   . GERD (gastroesophageal reflux disease)   . Hyperlipidemia   . Hypertension   . NSVD (normal spontaneous vaginal delivery)    x 2    Past Surgical History:  Procedure Laterality Date  . GANGLION CYST EXCISION     left foot  . 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 History of Abd. ultrasound  12/04   negative  . Surg. eval lap cholecstectomy  05/28/2006   deferred by patient  . TUBAL LIGATION  1993   bilateral    Social History   Tobacco Use  . Smoking status: Never Smoker  . Smokeless tobacco:  Never Used  Substance Use Topics  . Alcohol use: Yes    Alcohol/week: 1.0 - 2.0 standard drink    Types: 1 - 2 Standard drinks or equivalent per week    Comment: Occasional beer    Family History  Problem Relation Age of Onset  . Breast cancer Mother 64       pt's mother passed away from breast cancer when pt was 54 yo  . Diabetes Maternal Grandmother   . Colon cancer Neg Hx   . Esophageal cancer Neg Hx   .  Rectal cancer Neg Hx   . Stomach cancer Neg Hx     Review of Systems  Constitutional: Negative for chills and fever.  Respiratory: Positive for cough, shortness of breath and wheezing.   Cardiovascular: Negative for chest pain, palpitations and leg swelling.  Gastrointestinal: Negative for abdominal pain, nausea and vomiting.  Endo/Heme/Allergies: Positive for environmental allergies.     OBJECTIVE:  Today's Vitals   04/08/20 1627 04/08/20 1718  BP: (!) 155/80 128/84  Pulse: 94   Temp: 98 F (36.7 C)   TempSrc: Temporal   SpO2: 97%   Weight: 223 lb 6.4 oz (101.3 kg)   Height: 5' 11"  (1.803 m)    Body mass index is 31.16 kg/m.   Physical Exam Vitals and nursing note reviewed.  Constitutional:      Appearance: She is well-developed.  HENT:     Head: Normocephalic and atraumatic.     Mouth/Throat:     Pharynx: No oropharyngeal exudate.  Eyes:     General: No scleral icterus.    Extraocular Movements: Extraocular movements intact.     Conjunctiva/sclera: Conjunctivae normal.     Pupils: Pupils are equal, round, and reactive to light.  Cardiovascular:     Rate and Rhythm: Normal rate and regular rhythm.     Heart sounds: Normal heart sounds. No murmur heard.  No friction rub. No gallop.   Pulmonary:     Effort: Pulmonary effort is normal.     Breath sounds: Normal breath sounds. No wheezing, rhonchi or rales.  Musculoskeletal:     Cervical back: Neck supple.  Skin:    General: Skin is warm and dry.  Neurological:     Mental Status: She is alert and  oriented to person, place, and time.     No results found for this or any previous visit (from the past 24 hour(s)).  No results found.   ASSESSMENT and PLAN  1. Essential hypertension, benign Controlled. Continue current regime.   2. Type 2 diabetes mellitus with hyperglycemia, without long-term current use of insulin ([place]) Checking labs today, medications will be adjusted as needed. Discussed LFM, discussed starting SGLT2 if needed, reviewed r/se/b - Microalbumin / creatinine urine ratio - Ambulatory referral to Ophthalmology - Hemoglobin A1c  3. Hypercholesteremia Checking labs today, do not foresee being able to further titrate crestor - Lipid panel - Comprehensive metabolic panel  4. Moderate persistent asthma without complication 5. Seasonal allergies Stable. Managed by allergy and asthma. FMLA forms will be completed  6. Need for prophylactic vaccination and inoculation against influenza - Flu Vaccine QUAD 36+ mos IM  Return in about 3 months (around 07/08/2020).    Rutherford Guys, MD [facility], [place] [address] Ph.  [phone] Fax [phone]

## 2020-04-09 ENCOUNTER — Telehealth: Payer: Self-pay

## 2020-04-09 LAB — LIPID PANEL
Chol/HDL Ratio: 2.5 ratio (ref 0.0–4.4)
Cholesterol, Total: 167 mg/dL (ref 100–199)
HDL: 66 mg/dL (ref >39)
LDL Chol Calc (NIH): 74 mg/dL (ref 0–99)
Triglycerides: 158 mg/dL — ABNORMAL HIGH (ref 0–149)
VLDL Cholesterol Cal: 27 mg/dL (ref 5–40)

## 2020-04-09 LAB — COMPREHENSIVE METABOLIC PANEL
ALT: 91 IU/L — ABNORMAL HIGH (ref 0–32)
AST: 61 IU/L — ABNORMAL HIGH (ref 0–40)
Albumin/Globulin Ratio: 1.9 (ref 1.2–2.2)
Albumin: 4.6 g/dL (ref 3.8–4.9)
Alkaline Phosphatase: 82 IU/L (ref 44–121)
BUN/Creatinine Ratio: 18 (ref 9–23)
BUN: 12 mg/dL (ref 6–24)
Bilirubin Total: 0.3 mg/dL (ref 0.0–1.2)
CO2: 25 mmol/L (ref 20–29)
Calcium: 9.5 mg/dL (ref 8.7–10.2)
Chloride: 99 mmol/L (ref 96–106)
Creatinine, Ser: 0.68 mg/dL (ref 0.57–1.00)
GFR calc Af Amer: 114 mL/min/{1.73_m2} (ref >59)
GFR calc non Af Amer: 99 mL/min/{1.73_m2} (ref >59)
Globulin, Total: 2.4 g/dL (ref 1.5–4.5)
Glucose: 201 mg/dL — ABNORMAL HIGH (ref 65–99)
Potassium: 4.2 mmol/L (ref 3.5–5.2)
Sodium: 137 mmol/L (ref 134–144)
Total Protein: 7 g/dL (ref 6.0–8.5)

## 2020-04-09 LAB — HEMOGLOBIN A1C
Est. average glucose Bld gHb Est-mCnc: 214 mg/dL
Hgb A1c MFr Bld: 9.1 % — ABNORMAL HIGH (ref 4.8–5.6)

## 2020-04-09 LAB — MICROALBUMIN / CREATININE URINE RATIO
Creatinine, Urine: 31.3 mg/dL
Microalb/Creat Ratio: <10 mg/g creat (ref 0–29)
Microalbumin, Urine: <3 ug/mL

## 2020-04-09 NOTE — Telephone Encounter (Signed)
Spoke with patient to let her know that her FMLA forms have been faxed to [employer]. Copy of forms have been sent for scanning. Copy of previous forms are located in pt media of chart.

## 2020-04-20 ENCOUNTER — Other Ambulatory Visit: Payer: Self-pay | Admitting: Family Medicine

## 2020-04-20 MED ORDER — TRULICITY 0.75 MG/0.5ML ~~LOC~~ SOAJ: 0.7500 mg | SUBCUTANEOUS | 5 refills | Status: DC

## 2020-04-22 ENCOUNTER — Encounter: Payer: Self-pay | Admitting: Registered Nurse

## 2020-04-22 ENCOUNTER — Ambulatory Visit: Payer: Self-pay | Admitting: Registered Nurse

## 2020-04-22 ENCOUNTER — Other Ambulatory Visit: Payer: Self-pay

## 2020-04-22 VITALS — BP 146/93 | HR 94 | Temp 98.2°F

## 2020-04-22 DIAGNOSIS — I10 Essential (primary) hypertension: Secondary | ICD-10-CM

## 2020-04-22 DIAGNOSIS — M25531 Pain in right wrist: Secondary | ICD-10-CM

## 2020-04-22 DIAGNOSIS — U099 Post covid-19 condition, unspecified: Secondary | ICD-10-CM

## 2020-04-22 DIAGNOSIS — R202 Paresthesia of skin: Secondary | ICD-10-CM

## 2020-04-22 DIAGNOSIS — E1100 Type 2 diabetes mellitus with hyperosmolarity without nonketotic hyperglycemic-hyperosmolar coma (NKHHC): Secondary | ICD-10-CM

## 2020-04-22 DIAGNOSIS — R5383 Other fatigue: Secondary | ICD-10-CM

## 2020-04-22 MED ORDER — BIOFREEZE 4 % EX GEL: 1.0000 "application " | Freq: Four times a day (QID) | CUTANEOUS | 0 refills | Status: AC | PRN

## 2020-04-22 NOTE — Progress Notes (Signed)
Subjective:    Patient ID: Kelly Reed, female    DOB: Sep 26, 1965, 54 y.o.   MRN: [id number]  53y/o Caucasian established female pt c/o R wrist pain, posterior, and at times will extend like shooting pain to posterior R hand especially end of work shift/heavy boxes/pushing cart of product. Burning, stinging sensations most of the time.   Patient reported on recent [facility] visit Sep 2021 Hgba1c/blood sugars worse and fatigue not improving since covid infection either.  Patient reported she has gained 25 lbs also.   PCM stated was going to start her on new med still hasn't received call.  Aud[place] Nine earlier this year but couldn't tolerate it caused leg cramps/pain.  PCM changed her cholesterol medication also.   Having to shake out both hands tingling numbness at work all fingers and right wrist swelling and pain x 1 week.  Using heat, "I can't tolerate ice" biofreeze topical prn and tylenol prn.  Right hand dominant.  Trying to use right hand less but then left hand starts having pain/vicious cycle.  Denied new hobbies/exercise program/yard work for fall at home.  Patient used to have wrist wrap elastic bandage but can not find it at home.  Has not tried hard wrist splint in the past with wrist pain.  She is wearing copper infused neoprene gloves that cover wrists/fingers when at work.  Worried that holiday season around the corner and increased workload every holiday season also.  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  Denied trauma/falls.  Patient works in [employer] [place]  Constitutional: Positive for fatigue. Negative for activity change, appetite change, chills, diaphoresis and fever.  HENT: Negative for trouble swallowing and voice change.   Eyes: Negative for photophobia and visual disturbance.  Respiratory: Negative for cough, wheezing and stridor.   Cardiovascular: Negative for chest pain.   Gastrointestinal: Negative for diarrhea, nausea and vomiting.  Endocrine: Negative for cold intolerance and heat intolerance.  Genitourinary: Negative for difficulty urinating.  Musculoskeletal: Positive for arthralgias, joint swelling and myalgias. Negative for back pain, gait problem, neck pain and neck stiffness.  Skin: Negative for color change, pallor, rash and wound.  Allergic/Immunologic: Positive for environmental allergies. Negative for food allergies.  Neurological: Positive for numbness. Negative for dizziness, tremors, seizures, syncope, facial asymmetry, speech difficulty, weakness, light-headedness and headaches.  Psychiatric/Behavioral: Negative for agitation, confusion and sleep disturbance.       Objective:   Physical Exam Vitals and nursing note reviewed.  Constitutional:      General: She is awake. She is not in acute distress.    Appearance: Normal appearance. She is well-developed and well-groomed. She is obese. She is not ill-appearing, toxic-appearing or diaphoretic.  HENT:     Head: Normocephalic and atraumatic.     Jaw: There is normal jaw occlusion.     Salivary Glands: Right salivary gland is not diffusely enlarged. Left salivary gland is not diffusely enlarged.     Right Ear: Hearing and external ear normal.     Left Ear: Hearing and external ear normal.     Nose: Nose normal. No congestion or rhinorrhea.     Mouth/Throat:     Mouth: No angioedema.     Pharynx: Oropharynx is clear.  Eyes:     General: Lids are normal. Vision grossly intact. Gaze aligned appropriately. Allergic shiner present. No visual field deficit or scleral icterus.       Right eye: No discharge.  Left eye: No discharge.     Extraocular Movements: Extraocular movements intact.     Conjunctiva/sclera: Conjunctivae normal.     Pupils: Pupils are equal, round, and reactive to light.  Neck:     Trachea: Trachea and phonation normal.  Cardiovascular:     Rate and Rhythm: Normal  rate and regular rhythm.     Pulses: Normal pulses.          Radial pulses are 2+ on the right side and 2+ on the left side.  Pulmonary:     Effort: Pulmonary effort is normal. No respiratory distress.     Breath sounds: Normal breath sounds and air entry. No stridor. No wheezing.     Comments: Wearing cloth mask due to covid 19 pandemic; spoke full sentences without difficulty; no cough/nasal/throat clearing noted in clinic Abdominal:     General: Abdomen is flat.  Musculoskeletal:        General: Tenderness present. No swelling, deformity or signs of injury.     Right wrist: Tenderness and bony tenderness present. No swelling, deformity, effusion, lacerations, snuff box tenderness or crepitus. Decreased range of motion. Normal pulse.     Left wrist: No swelling, deformity, effusion, lacerations, tenderness, bony tenderness, snuff box tenderness or crepitus. Normal range of motion. Normal pulse.       Arms:     Cervical back: Normal range of motion and neck supple. No edema, erythema, signs of trauma, rigidity, tenderness or crepitus. Normal range of motion.     Comments: Negative finkelsteins/phalen's/tinnel's tests; decreased AROM flexion and extension due to pain bilaterally at end AROM 70 degrees; TTP radiocarpal  And intercarpal joints bony and soft tissue; no crepitus with AROM no snuff box tenderness, MCP/DIP/PIP joints and fingers not TTP/thenar prominance not TTP soft tissue; full AROM flexion/extension of fingers without pain/crepitus and motion smooth; strength right hand grip 4.5/5 and left 5/5 flexion; finger opposition/ abduction/adduction bilaterally normal and radial/ulnar deviation wrist AROM normal but wrist with pain  Lymphadenopathy:     Head:     Right side of head: No preauricular adenopathy.     Left side of head: No preauricular adenopathy.     Cervical: No cervical adenopathy.     Right cervical: No superficial cervical adenopathy.    Left cervical: No superficial  cervical adenopathy.  Skin:    General: Skin is warm and dry.     Capillary Refill: Capillary refill takes less than 2 seconds.     Coloration: Skin is not ashen, cyanotic, jaundiced, mottled, pale or sallow.     Findings: No abrasion, abscess, acne, bruising, burn, ecchymosis, erythema, signs of injury, laceration, lesion, petechiae, rash or wound.     Nails: There is no clubbing.  Neurological:     General: No focal deficit present.     Mental Status: She is alert and oriented to person, place, and time. Mental status is at baseline.     GCS: GCS eye subscore is 4. GCS verbal subscore is 5. GCS motor subscore is 6.     Cranial Nerves: Cranial nerves are intact. No cranial nerve deficit, dysarthria or facial asymmetry.     Sensory: No sensory deficit.     Motor: Motor function is intact. No weakness, tremor, atrophy, abnormal muscle tone or seizure activity.     Coordination: Coordination is intact. Coordination normal.     Gait: Gait is intact. Gait normal.     Comments: No sensation deficit gross; in/out of chair and on/off  exam table without difficulty; gait sure and steady in clinic  Psychiatric:        Attention and Perception: Attention and perception normal.        Mood and Affect: Mood and affect normal.        Speech: Speech normal.        Behavior: Behavior normal. Behavior is cooperative.        Thought Content: Thought content normal.        Cognition and Memory: Cognition and memory normal.        Judgment: Judgment normal.     Fitted and distributed right wrist splint large from clinic clinic stock and wrist elastic support 2 inch with velcro.  Discussed care no washing machine/dryer.  Wear at work and in bed 7 days then may decrease to bedtime only.        Assessment & Plan:  A-acute right wrist pain initial visit, bilateral hand paresthesias, elevated blood pressure with diagnosis hypertension, type II diabetes with hyperosmolarity without long term use of insulin,  fatigue  P-Diabetes worsening blood sugar control post covid infection 04/10/2019 positive test.  Per Epic  noted PCM ordered patient to start trulicity.  Patient had not read her mychart messages and was unaware new medication ordered this week.  She had tried to call [facility] office last week and this week but had to hang up when her work break over after being placed on hold for long duration.  Per [employer] site trulicity copay $009.  Patient reported she would not be able to afford new medication.  RN Hildred Alamin found manufacturer coupon for patient decreases cost to $25 per fill for the next year but patient needs to activate coupon in her email and bring to pharmacy.  Last hgba1c 9.1 per [place].  Read my chart message sent to patient by [facility] to patient with instructions. "Diabetes not at goal. I have sent in a prescription for trulicity as discussed. It is a once a week injection. I recommend that you have a nurse visit to learn how to use it, which is very straight forward simple. Cholesterol controlled Normal kidney function. Liver function is stable. No protein in urine which is good Gaspar Garbe, MD"  Discussed with patient RN Hildred Alamin or [facility] office nurses/pharmacist can assist with demonstration/instructions of her new subcutanous injection medication trulicity 3.8HW SQ weekly.  Patient frustrated that her usual diabetic diet has not been working since covid infection and not able to get blood sugar back under control and also having weight gain.  Failed Januvia this summer due to side effects muscle aches could not tolerate use.  Patient reported new cholesterol medication is not having any side effects and tolerating well.  Follow up with PCM 3 months after starting trulicity recommended repeat Hgba1c.  Discussed paresthesias may be due to hyperglycemia/diabetic neuropathy and/or start of carpal tunnel syndrome also.  Starting with conservative therapy brace/ice/better diabetes control.   Patient reported had to stop [place] and her cholesterol medication trial earlier this year due to muscle aches.  Common side effect per up to date for both patient notified.  Patient verbalized understanding information/instructions, agreed with plan of care and had no further questions at this time.  Patient concerned about her fatigue/energy level has never returned to normal after covid infection also.  Still fatigued at work throughout shift.  Discussed monitoring energy level and trying to plan her day so most exertion during her better time of  day less fatigue.  Discussed some patients needing rehab to increase stamina post covid.  Patient reported 25 lb weight gain since covid and blood sugars have been harder to control.  Discussed with patient post covid non diabetic patients have developed diabetes and noted in some patients blood sugars not responding per pre covid per CDC long covid provider continuing ed Sep 2021.  PASC fatigue treatment recommendation phased return to activity, continue household and community tolerated activities with slower return to higher intensity activities, exercise following rules of 10s increasing duration, intensity and frequency 10% every 10 days.  Use Rate of Perceived Exertion (RPE) scale start at 10-11/light and progress to 14-15/hard go back to previous level is activity not tolerated.  The 4 Ps of energy conservation  Pacing avoiding the push and crash cycle common in post-covid recovery.  Prioritizing encourage patient to focus and decide on which activities need to get done and which can be postponed to avoid overextertion and crashing.  Positioning modifying activities to make them easier to perform  Planning encourage patient to plan day/week to avoid overexertion and recognize energy windows.  Encouraged patient to follow healthy dietary pattern and stay hydrated throughout the day.  No scientific data to support prescription of one specific diet at this time  for management of PASC related fatigue.  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  The use of accupuncture has been reported by collaborative patient representative members to improve fatigue but no direct evidence to support its use in PASC related fatigue some preliminary low quality evidence that supports its use.  Hyperglycemia may also be worsening fatigue.  Avoid dehydration.  Patient given [facility] handout with above information.  Patient verbalized understanding information/instructions and had no further questions at this time.  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  Follow up with RN Hildred Alamin when pain under better control repeat BP check.  Consider decrease caffeine intake.  ER if chest pain, worst headache of life, dyspnea or visual changes for re-evaluation.    Fitted and distributed wrist splint wright large from clinic stock.  Patient refused cryotherapy "it doesn't work for me"  I use heat and compression.  Discussed may hand wash and air dry or use blowdryer but do not place splint in drying machine for clothes as can melt plastic.  biofreeze gel apply QID prn pain  Tylenol 1000mg  po QID prn pain OTC  Patient was instructed to rest, ice and elevate hands/wrist if swelling noted after work.  Recommended Cryotherapy 15 minutes TID prn pain/swelling. [employer] handout on carpal tunnel, paresthesias and carpal tunnel exercises printed and given to patient. Medications as directed.  Suspect overuse soft tissue injury related pain due to repetitive motion of her job. Call or return to clinic as needed if these symptoms worsen  or fail to improve as anticipated and will consider PT and orthopedics evaluation.  Next follow up evaluation to be scheduled with RN Hildred Alamin  in 2 weeks.  Starting new diabetic medication to help decrease blood sugars also should help to decrease inflammation in her body/paresthesias/pain/fatigue.   Avoid starting new activities/yard work/exercise program that utilize hands in the next two weeks other than new stretches.  If needs work restrictions will need appt [place] or [employer] as contract prohibits me from writing work restrictions  in this clinic  Patient verbalized agreement and understanding of treatment plan and had no further questions at this time. P2: ROM, injury prevention

## 2020-04-22 NOTE — Patient Instructions (Addendum)
Paresthesia Paresthesia is an abnormal burning or prickling sensation. It is usually felt in the hands, arms, legs, or feet. However, it may occur in any part of the body. Usually, paresthesia is not painful. It may feel like:  Tingling or numbness.  Buzzing.  Itching. Paresthesia may occur without any clear cause, or it may be caused by:  Breathing too quickly (hyperventilation).  Pressure on a nerve.  An underlying medical condition.  Side effects of a medication.  Nutritional deficiencies.  Exposure to toxic chemicals. Most people experience temporary (transient) paresthesia at some time in their lives. For some people, it may be long-lasting (chronic) because of an underlying medical condition. If you have paresthesia that lasts a long time, you may need to be evaluated by your health care provider. Follow these instructions at home: Alcohol use   Do not drink alcohol if: ? Your health care provider tells you not to drink. ? You are pregnant, may be pregnant, or are planning to become pregnant.  If you drink alcohol: ? Limit how much you use to:  0-1 drink a day for women.  0-2 drinks a day for men. ? Be aware of how much alcohol is in your drink. In the [place] one drink equals one 12 oz bottle of beer (355 mL), one 5 oz glass of wine (148 mL), or one 1 oz glass of hard liquor (44 mL). Nutrition   Eat a healthy diet. This includes: ? Eating foods that are high in fiber, such as fresh fruits and vegetables, whole grains, and beans. ? Limiting foods that are high in fat and processed sugars, such as fried or sweet foods. General instructions  Take over-the-counter and prescription medicines only as told by your health care provider.  Do not use any products that contain nicotine or tobacco, such as cigarettes and e-cigarettes. These can keep blood from reaching damaged nerves. If you need help quitting, ask your health care provider.  If you have diabetes, work  closely with your health care provider to keep your blood sugar under control.  If you have numbness in your feet: ? Check every day for signs of injury or infection. Watch for redness, warmth, and swelling. ? Wear padded socks and comfortable shoes. These help protect your feet.  Keep all follow-up visits as told by your health care provider. This is important. Contact a health care provider if you:  Have paresthesia that gets worse or does not go away.  Have a burning or prickling feeling that gets worse when you walk.  Have pain, cramps, or dizziness.  Develop a rash. Get help right away if you:  Feel weak.  Have trouble walking or moving.  Have problems with speech, understanding, or vision.  Feel confused.  Cannot control your bladder or bowel movements.  Have numbness after an injury.  Develop new weakness in an arm or leg.  Faint. Summary  Paresthesia is an abnormal burning or prickling sensation that is usually felt in the hands, arms, legs, or feet. It may also occur in other parts of the body.  Paresthesia may occur without any clear cause, or it may be caused by breathing too quickly (hyperventilation), pressure on a nerve, an underlying medical condition, side effects of a medication, nutritional deficiencies, or exposure to toxic chemicals.  If you have paresthesia that lasts a long time, you may need to be evaluated by your health care provider. This information is not intended to replace advice given to you by   your health care provider. Make sure you discuss any questions you have with your health care provider. Document Revised: 07/22/2018 Document Reviewed: 07/05/2017 Elsevier Patient Education  [place] (Nerve Compression Syndrome): Wrist Stretch    Extend right arm with fingers facing down. With left hand, gently pull fingers of right hand toward body. Hold position for __3_ breaths. Repeat with arms switched. Repeat _3__  times, alternating arms. Do __3_ times per day.  Copyright  VHI. All rights reserved.  CARPAL TUNNEL (Nerve Compression Syndrome): Wrist Rotation    Make a loose fist with each hand and rotate at the wrists in one direction _10__ times. Repeat in other direction. Do __3_ times per day.  Copyright  VHI. All rights reserved.  CARPAL TUNNEL (Nerve Compression Syndrome): Reverse [place]    Reach behind back and bring palms together with fingers up. Hold position for _3__ breaths. Repeat__3_ times. Do _3__ times per day.  Copyright  VHI. All rights reserved.  CARPAL TUNNEL (Nerve Compression Syndrome): Horizontal Adductor Stretch (Standing)    Place right hand on wall. Inhaling, turn torso toward left. Hold position for _3__ breaths. Repeat _3__ times. Repeat with left hand on wall. Do _3__ times per day.  Copyright  VHI. All rights reserved.  CARPAL TUNNEL (Nerve Compression Syndrome): [place]    Bring right arm in front, elbow bent, forearm up. Reach left arm under right and, if possible, bring left fingers into right palm, thumbs facing body. (If not able to bring fingers into palm, just hold position where comfortable.) Hold position for _3__ breaths. Switch arms and repeat. Repeat __3_ times. Do _3__ times per day.  Copyright  VHI. All rights reserved.  Carpal Tunnel Syndrome  Carpal tunnel syndrome is a condition that causes pain in your hand and arm. The carpal tunnel is a narrow area located on the palm side of your wrist. Repeated wrist motion or certain diseases may cause swelling within the tunnel. This swelling pinches the main nerve in the wrist (median nerve). What are the causes? This condition may be caused by:  Repeated wrist motions.  Wrist injuries.  Arthritis.  A cyst or tumor in the carpal tunnel.  Fluid buildup during pregnancy. Sometimes the cause of this condition is not known. What increases the risk? The following factors may make you  more likely to develop this condition:  Having a job, such as being a Research scientist (life sciences), that requires you to repeatedly move your wrist in the same motion.  Being a woman.  Having certain conditions, such as: ? Diabetes. ? Obesity. ? An underactive thyroid (hypothyroidism). ? Kidney failure. What are the signs or symptoms? Symptoms of this condition include:  A tingling feeling in your fingers, especially in your thumb, index, and middle fingers.  Tingling or numbness in your hand.  An aching feeling in your entire arm, especially when your wrist and elbow are bent for a long time.  Wrist pain that goes up your arm to your shoulder.  Pain that goes down into your palm or fingers.  A weak feeling in your hands. You may have trouble grabbing and holding items. Your symptoms may feel worse during the night. How is this diagnosed? This condition is diagnosed with a medical history and physical exam. You may also have tests, including:  Electromyogram (EMG). This test measures electrical signals sent by your nerves into the muscles.  Nerve conduction study. This test measures how well electrical signals pass through  your nerves.  Imaging tests, such as X-rays, ultrasound, and MRI. These tests check for possible causes of your condition. How is this treated? This condition may be treated with:  Lifestyle changes. It is important to stop or change the activity that caused your condition.  Doing exercise and activities to strengthen your muscles and bones (physical therapy).  Learning how to use your hand again after diagnosis (occupational therapy).  Medicines for pain and inflammation. This may include medicine that is injected into your wrist.  A wrist splint.  Surgery. Follow these instructions at home: If you have a splint:  Wear the splint as told by your health care provider. Remove it only as told by your health care provider.  Loosen the splint if your  fingers tingle, become numb, or turn cold and blue.  Keep the splint clean.  If the splint is not waterproof: ? Do not let it get wet. ? Cover it with a watertight covering when you take a bath or shower. Managing pain, stiffness, and swelling   If directed, put ice on the painful area: ? If you have a removable splint, remove it as told by your health care provider. ? Put ice in a plastic bag. ? Place a towel between your skin and the bag. ? Leave the ice on for 20 minutes, 2-3 times per day. General instructions  Take over-the-counter and prescription medicines only as told by your health care provider.  Rest your wrist from any activity that may be causing your pain. If your condition is work related, talk with your employer about changes that can be made, such as getting a wrist pad to use while typing.  Do any exercises as told by your health care provider, physical therapist, or occupational therapist.  Keep all follow-up visits as told by your health care provider. This is important. Contact a health care provider if:  You have new symptoms.  Your pain is not controlled with medicines.  Your symptoms get worse. Get help right away if:  You have severe numbness or tingling in your wrist or hand. Summary  Carpal tunnel syndrome is a condition that causes pain in your hand and arm.  It is usually caused by repeated wrist motions.  Lifestyle changes and medicines are used to treat carpal tunnel syndrome. Surgery may be recommended.  Follow your health care provider's instructions about wearing a splint, resting from activity, keeping follow-up visits, and calling for help. This information is not intended to replace advice given to you by your health care provider. Make sure you discuss any questions you have with your health care provider. Document Revised: 11/02/2017 Document Reviewed: 11/02/2017 Elsevier Patient Education  2020 [employer].  Patient also given  [place] on low histamine diet and CDC COCA information on post covid fatigue management

## 2020-04-27 ENCOUNTER — Telehealth: Payer: Self-pay | Admitting: Family Medicine

## 2020-04-27 ENCOUNTER — Other Ambulatory Visit: Payer: Self-pay

## 2020-04-27 DIAGNOSIS — J302 Other seasonal allergic rhinitis: Secondary | ICD-10-CM

## 2020-04-27 NOTE — Telephone Encounter (Signed)
Pt called about refill request for montelukast (SINGULAIR) 10 MG tablet  Pt stated this request was made last week and she has been told a couple times that this has been sent it but it has not/ please advise or send asap to [facility], [facility] [address]  [place], [place] [address]  Phone:  [phone] Fax:  [phone]

## 2020-04-29 ENCOUNTER — Telehealth: Payer: Self-pay | Admitting: Registered Nurse

## 2020-04-29 ENCOUNTER — Other Ambulatory Visit: Payer: Self-pay | Admitting: Family Medicine

## 2020-04-29 ENCOUNTER — Encounter: Payer: Self-pay | Admitting: Registered Nurse

## 2020-04-29 DIAGNOSIS — R112 Nausea with vomiting, unspecified: Secondary | ICD-10-CM

## 2020-04-29 DIAGNOSIS — R630 Anorexia: Secondary | ICD-10-CM

## 2020-04-29 DIAGNOSIS — R252 Cramp and spasm: Secondary | ICD-10-CM

## 2020-04-29 DIAGNOSIS — J302 Other seasonal allergic rhinitis: Secondary | ICD-10-CM

## 2020-04-29 DIAGNOSIS — R42 Dizziness and giddiness: Secondary | ICD-10-CM

## 2020-04-29 MED ORDER — MONTELUKAST SODIUM 10 MG PO TABS: ORAL_TABLET | ORAL | 3 refills | Status: DC

## 2020-04-29 NOTE — Telephone Encounter (Signed)
Patient called to say she has requested Montelukast 10 mg tab several times for refill. Her pharmacy has not received the rx.  She is requesting medication sent to [facility].  Will rout to office for approval.

## 2020-04-29 NOTE — Telephone Encounter (Signed)
Patient seen in workstation to follow up GI upset after starting trulicity injections this week.  Patient reported her appetite has been decreased and having daily am nausea sometimes she throws up and other days does not especially if she sips ginger ale/7Up.  Has not been taking her metformin when she wakes up at 0415 typically waits until she has break at work/eats snack.  Lunch typically 1130 and taking second metformin dose with dinner.  Patient denied hypoglycemia symptoms since starting trulicity.  Has had dizziness with certain movements (bending at waist or head down).  Patient reported work busy, not drinking as much water since wearing mask.  Wondering if she has fluid in ears because that has caused dizziness in the past.  She is taking her allergy medications every day, showering after work and has meclizine at home for prn dizziness use.  Has not checked her fasting am blood sugar since starting trulicity.  Day off tomorrow discussed with patient to check at least weekly fasting blood sugar since starting new medication especially with her not feeling well.  Patient reported she is not concerned for hypoglycemia as typically never battled low blood sugar more hyperglycemia episodes.  Discussed half life metformin 6 hours plasma and 17 hours blood. Discussed GI upset commonly cited with trulicity per [facility] along with decreased appetite but dizziness not listed as common.  Trulicity half life 5 days.  Discussed with patient that if hyperglycemia and spilling over in urine more water released from body/follows blood sugar and can lead to dehydration also and encouraged her to sip water as often as she can at work.  Patient denied abdomen pain, blood in urine/stool/emesis.  Since starting wrist splint tingling and numbness has improved in fingers but noting after work wearing elastic wrist support she has noticed muscle spasms in the palm of her hand.  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  Stressing her out and repetitive motion/lifting/moving products on carts.  Wrist pain also bothering her.  Patient may need to see [place] provider for work restrictions/consider orthopedics.  Again discussed with patient that elevated blood sugars can contribute to paresthesias in hands.  Continue plan of care for wrist pain/swelling/paresthesias as previously discussed.  Follow up for re-evaluation next week in clinic.  Patient verbalized understanding information/instructions, agreed with plan of care and had no further questions at this time.

## 2020-05-10 ENCOUNTER — Telehealth: Payer: Self-pay | Admitting: Family Medicine

## 2020-05-10 NOTE — Telephone Encounter (Signed)
Pt is calling because her employer just notified her to call Dr office . The insurance [employer] let patient know that paperwork that was submitted is missing info from provider  Page 2 part A (the date illness started and how long it will last )    Patient is requesting that some one call her

## 2020-05-11 NOTE — Telephone Encounter (Signed)
Called pt, left message for call bk

## 2020-05-19 ENCOUNTER — Telehealth: Payer: Self-pay | Admitting: *Deleted

## 2020-05-19 NOTE — Telephone Encounter (Signed)
Schedule mammogram.

## 2020-05-31 ENCOUNTER — Other Ambulatory Visit: Payer: Self-pay | Admitting: Registered Nurse

## 2020-05-31 NOTE — Telephone Encounter (Signed)
Requested medication (s) are due for refill today: yes  Requested medication (s) are on the active medication list: yes  Last refill:  omeprazole 11/28/19  #60  3 refills, Losartan  10/31/19  #90 1 refill  Future visit scheduled: yes [facility] Just NP  Notes to clinic:  last ordered by Dr Pamella Pert  Has not been seen yet by another practitioner.    Requested Prescriptions  Pending Prescriptions Disp Refills   omeprazole (PRILOSEC) 20 MG capsule [Pharmacy Med Name: OMEPRAZOLE 20 MG CAP 20 Capsule] 60 capsule 3    Sig: TAKE 1 CAPSULE BY MOUTH 2 TIMES DAILY BEFORE A MEAL.      Gastroenterology: Proton Pump Inhibitors Passed - 05/31/2020  4:12 PM      Passed - Valid encounter within last 12 months    Recent Outpatient Visits           1 month ago Essential hypertension, benign   [facility] at [facility], Lilia Argue, MD   3 months ago Type 2 diabetes mellitus with hyperglycemia, without long-term current use of insulin [facility])   [facility], Lilia Argue, MD   4 months ago Myalgia   [facility], Lilia Argue, MD   6 months ago Gastroesophageal reflux disease, unspecified whether esophagitis present   [facility], Lilia Argue, MD   7 months ago RLQ abdominal pain   [facility] at [facility], Lilia Argue, MD       Future Appointments             In 1 month Just, Laurita Quint, FNP Primary [facility] at [place], [facility]              losartan (COZAAR) 100 MG tablet [Pharmacy Med Name: LOSARTAN POT 100 MG TAB 100 Tablet] 90 tablet 1    Sig: TAKE 1 TABLET BY MOUTH ONCE DAILY      Cardiovascular:  Angiotensin Receptor Blockers Failed - 05/31/2020  4:12 PM      Failed - Last BP in normal range    BP Readings from Last 1 Encounters:  04/22/20 (!) 146/93          Passed - Cr in normal range and within 180 days    Creat  Date Value Ref Range Status  03/23/2016 0.60 0.50 - 1.05 mg/dL Final    Comment:      For patients > or = 56 years of age: The  upper reference limit for Creatinine is approximately 13% higher for people identified as African-American.      Creatinine, Ser  Date Value Ref Range Status  04/08/2020 0.68 0.57 - 1.00 mg/dL Final   Creatinine,U  Date Value Ref Range Status  01/30/2011 17.0 mg/dL Final   Creatinine, Urine  Date Value Ref Range Status  09/16/2015 38 20 - 320 mg/dL Final          Passed - K in normal range and within 180 days    Potassium  Date Value Ref Range Status  04/08/2020 4.2 3.5 - 5.2 mmol/L Final          Passed - Patient is not pregnant      Passed - Valid encounter within last 6 months    Recent Outpatient Visits           1 month ago Essential hypertension, benign   [facility], Lilia Argue, MD   3 months ago Type 2 diabetes mellitus with hyperglycemia,  without long-term current use of insulin [facility])   [facility] at [facility], Lilia Argue, MD   4 months ago Myalgia   [facility], Lilia Argue, MD   6 months ago Gastroesophageal reflux disease, unspecified whether esophagitis present   [facility], Lilia Argue, MD   7 months ago RLQ abdominal pain   [facility], Lilia Argue, MD       Future Appointments             In 1 month Just, Laurita Quint, FNP Primary Care at [place], [facility]

## 2020-06-24 ENCOUNTER — Encounter: Payer: Self-pay | Admitting: Registered Nurse

## 2020-06-24 ENCOUNTER — Telehealth: Payer: Self-pay | Admitting: Registered Nurse

## 2020-06-24 ENCOUNTER — Ambulatory Visit: Payer: PRIVATE HEALTH INSURANCE | Admitting: Registered Nurse

## 2020-06-24 ENCOUNTER — Other Ambulatory Visit: Payer: Self-pay

## 2020-06-24 VITALS — BP 139/93 | HR 94 | Temp 98.8°F | Resp 16

## 2020-06-24 DIAGNOSIS — M62838 Other muscle spasm: Secondary | ICD-10-CM

## 2020-06-24 MED ORDER — BIOFREEZE 4 % EX GEL: 1.0000 "application " | Freq: Four times a day (QID) | CUTANEOUS | 0 refills | Status: AC | PRN

## 2020-06-24 MED ORDER — ACETAMINOPHEN 500 MG PO TABS: 1000.0000 mg | ORAL_TABLET | Freq: Four times a day (QID) | ORAL | 0 refills | Status: AC | PRN

## 2020-06-24 MED ORDER — CYCLOBENZAPRINE HCL 10 MG PO TABS: 5.0000 mg | ORAL_TABLET | Freq: Three times a day (TID) | ORAL | 0 refills | Status: AC | PRN

## 2020-06-24 NOTE — Telephone Encounter (Signed)
Patient missed scheduled appt.  Called to reschedule.  Patient stated working until 1430 and cannot leave workcenter at this time.  Discussed if I am still in clinic at 1430 (overtime) I can see her today otherwise I recommend following up with [facility] as next clinic day is 21 Dec 03-1199.  RN Hildred Alamin had family emergency and will not be in clinic this afternoon. Patient stated I can deal with what I have going on and work through it at this time. Patient verbalized understanding information/instructions and had no further questions at this time.

## 2020-06-24 NOTE — Patient Instructions (Signed)
Cervical Sprain  A cervical sprain is a stretch or tear in one or more of the tough, cord-like tissues that connect bones (ligaments) in the neck. Cervical sprains can range from mild to severe. Severe cervical sprains can cause the spinal bones (vertebrae) in the neck to be unstable. This can lead to spinal cord damage and can result in serious nervous system problems. The amount of time that it takes for a cervical sprain to get better depends on the cause and extent of the injury. Most cervical sprains heal in 4-6 weeks. What are the causes? Cervical sprains may be caused by an injury (trauma), such as from a motor vehicle accident, a fall, or sudden forward and backward whipping movement of the head and neck (whiplash injury). Mild cervical sprains may be caused by wear and tear over time, such as from poor posture, sitting in a chair that does not provide support, or looking up or down for long periods of time. What increases the risk? The following factors may make you more likely to develop this condition:  Participating in activities that have a high risk of trauma to the neck. These include contact sports, auto racing, gymnastics, and diving.  Taking risks when driving or riding in a motor vehicle, such as speeding.  Having osteoarthritis of the spine.  Having poor strength and flexibility of the neck.  A previous neck injury.  Having poor posture.  Spending a lot of time in certain positions that put stress on the neck, such as sitting at a computer for long periods of time. What are the signs or symptoms? Symptoms of this condition include:  Pain, soreness, stiffness, tenderness, swelling, or a burning sensation in the front, back, or sides of the neck.  Sudden tightening of neck muscles that you cannot control (muscle spasms).  Pain in the shoulders or upper back.  Limited ability to move the neck.  Headache.  Dizziness.  Nausea.  Vomiting.  Weakness, numbness,  or tingling in a hand or an arm. Symptoms may develop right away after injury, or they may develop over a few days. In some cases, symptoms may go away with treatment and return (recur) over time. How is this diagnosed? This condition may be diagnosed based on:  Your medical history.  Your symptoms.  Any recent injuries or known neck problems that you have, such as arthritis in the neck.  A physical exam.  Imaging tests, such as: ? X-rays. ? MRI. ? CT scan. How is this treated? This condition is treated by resting and icing the injured area and doing physical therapy exercises. Depending on the severity of your condition, treatment may also include:  Keeping your neck in place (immobilized) for periods of time. This may be done using: ? A cervical collar. This supports your chin and the back of your head. ? A cervical traction device. This is a sling that holds up your head. This removes weight and pressure from your neck, and it may help to relieve pain.  Medicines that help to relieve pain and inflammation.  Medicines that help to relax your muscles (muscle relaxants).  Surgery. This is rare. Follow these instructions at home: If you have a cervical collar:   Wear it as told by your health care provider. Do not remove the collar unless instructed by your health care provider.  Ask your health care provider before you make any adjustments to your collar.  If you have long hair, keep it outside of the   collar.  Ask your health care provider if you can remove the collar for cleaning and bathing. If you are allowed to remove the collar for cleaning or bathing: ? Follow instructions from your health care provider about how to remove the collar safely. ? Clean the collar by wiping it with mild soap and water and drying it completely. ? If your collar has removable pads, remove them every 1-2 days and wash them by hand with soap and water. Let them air-dry completely before you  put them back in the collar. ? Check your skin under the collar for irritation or sores. If you see any, tell your health care provider. Managing pain, stiffness, and swelling   If directed, use a cervical traction device as told by your health care provider.  If directed, apply heat to the affected area before you do your physical therapy or as often as told by your health care provider. Use the heat source that your health care provider recommends, such as a moist heat pack or a heating pad. ? Place a towel between your skin and the heat source. ? Leave the heat on for 20-30 minutes. ? Remove the heat if your skin turns bright red. This is especially important if you are unable to feel pain, heat, or cold. You may have a greater risk of getting burned.  If directed, put ice on the affected area: ? Put ice in a plastic bag. ? Place a towel between your skin and the bag. ? Leave the ice on for 20 minutes, 2-3 times a day. Activity  Do not drive while wearing a cervical collar. If you do not have a cervical collar, ask your health care provider if it is safe to drive while your neck heals.  Do not drive or use heavy machinery while taking prescription pain medicine or muscle relaxants, unless your health care provider approves.  Do not lift anything that is heavier than 10 lb (4.5 kg) until your health care provider tells you that it is safe.  Rest as directed by your health care provider. Avoid positions and activities that make your symptoms worse. Ask your health care provider what activities are safe for you.  If physical therapy was prescribed, do exercises as told by your health care provider or physical therapist. General instructions  Take over-the-counter and prescription medicines only as told by your health care provider.  Do not use any products that contain nicotine or tobacco, such as cigarettes and e-cigarettes. These can delay healing. If you need help quitting, ask your  health care provider.  Keep all follow-up visits as told by your health care provider or physical therapist. This is important. How is this prevented? To prevent a cervical sprain from happening again:  Use and maintain good posture. Make any needed adjustments to your workstation to help you use good posture.  Exercise regularly as directed by your health care provider or physical therapist.  Avoid risky activities that may cause a cervical sprain. Contact a health care provider if:  You have symptoms that get worse or do not get better after 2 weeks of treatment.  You have pain that gets worse or does not get better with medicine.  You develop new, unexplained symptoms.  You have sores or irritated skin on your neck from wearing your cervical collar. Get help right away if:  You have severe pain.  You develop numbness, tingling, or weakness in any part of your body.  You cannot move   a part of your body (you have paralysis).  You have neck pain along with: ? Severe dizziness. ? Headache. Summary  A cervical sprain is a stretch or tear in one or more of the tough, cord-like tissues that connect bones (ligaments) in the neck.  Cervical sprains may be caused by an injury (trauma), such as from a motor vehicle accident, a fall, or sudden forward and backward whipping movement of the head and neck (whiplash injury).  Symptoms may develop right away after injury, or they may develop over a few days.  This condition is treated by resting and icing the injured area and doing physical therapy exercises. This information is not intended to replace advice given to you by your health care provider. Make sure you discuss any questions you have with your health care provider. Document Revised: 10/16/2018 Document Reviewed: 02/23/2016 Elsevier Patient Education  [place]. Cervical Strain and Sprain Rehab Ask your health care provider which exercises are safe for you. Do  exercises exactly as told by your health care provider and adjust them as directed. It is normal to feel mild stretching, pulling, tightness, or discomfort as you do these exercises. Stop right away if you feel sudden pain or your pain gets worse. Do not begin these exercises until told by your health care provider. Stretching and range-of-motion exercises Cervical side bending  1. Using good posture, sit on a stable chair or stand up. 2. Without moving your shoulders, slowly tilt your left / right ear to your shoulder until you feel a stretch in the opposite side neck muscles. You should be looking straight ahead. 3. Hold for ____15______ seconds. 4. Repeat with the other side of your neck. Repeat ___3_______ times. Complete this exercise ___3_______ times a day. Cervical rotation  1. Using good posture, sit on a stable chair or stand up. 2. Slowly turn your head to the side as if you are looking over your left / right shoulder. ? Keep your eyes level with the ground. ? Stop when you feel a stretch along the side and the back of your neck. 3. Hold for ____15______ seconds. 4. Repeat this by turning to your other side. Repeat _____3_____ times. Complete this exercise ___3_______ times a day. Thoracic extension and pectoral stretch 1. Roll a towel or a small blanket so it is about 4 inches (10 cm) in diameter. 2. Lie down on your back on a firm surface. 3. Put the towel lengthwise, under your spine in the middle of your back. It should not be under your shoulder blades. The towel should line up with your spine from your middle back to your lower back. 4. Put your hands behind your head and let your elbows fall out to your sides. 5. Hold for _____15_____ seconds. Repeat ___3_______ times. Complete this exercise __3________ times a day. Strengthening exercises Isometric upper cervical flexion 1. Lie on your back with a thin pillow behind your head and a small rolled-up towel under your  neck. 2. Gently tuck your chin toward your chest and nod your head down to look toward your feet. Do not lift your head off the pillow. 3. Hold for _____15_____ seconds. 4. Release the tension slowly. Relax your neck muscles completely before you repeat this exercise. Repeat _____3____ times. Complete this exercise ___3_______ times a day. Isometric cervical extension  1. Stand about 6 inches (15 cm) away from a wall, with your back facing the wall. 2. Place a soft object, about 6-8 inches (15-20  cm) in diameter, between the back of your head and the wall. A soft object could be a small pillow, a ball, or a folded towel. 3. Gently tilt your head back and press into the soft object. Keep your jaw and forehead relaxed. 4. Hold for ____15______ seconds. 5. Release the tension slowly. Relax your neck muscles completely before you repeat this exercise. Repeat ____3______ times. Complete this exercise ___3_______ times a day. Posture and body mechanics Body mechanics refers to the movements and positions of your body while you do your daily activities. Posture is part of body mechanics. Good posture and healthy body mechanics can help to relieve stress in your body's tissues and joints. Good posture means that your spine is in its natural S-curve position (your spine is neutral), your shoulders are pulled back slightly, and your head is not tipped forward. The following are general guidelines for applying improved posture and body mechanics to your everyday activities. Sitting  1. When sitting, keep your spine neutral and keep your feet flat on the floor. Use a footrest, if necessary, and keep your thighs parallel to the floor. Avoid rounding your shoulders, and avoid tilting your head forward. 2. When working at a desk or a computer, keep your desk at a height where your hands are slightly lower than your elbows. Slide your chair under your desk so you are close enough to maintain good posture. 3. When  working at a computer, place your monitor at a height where you are looking straight ahead and you do not have to tilt your head forward or downward to look at the screen. Standing   When standing, keep your spine neutral and keep your feet about hip-width apart. Keep a slight bend in your knees. Your ears, shoulders, and hips should line up.  When you do a task in which you stand in one place for a long time, place one foot up on a stable object that is 2-4 inches (5-10 cm) high, such as a footstool. This helps keep your spine neutral. Resting When lying down and resting, avoid positions that are most painful for you. Try to support your neck in a neutral position. You can use a contour pillow or a small rolled-up towel. Your pillow should support your neck but not push on it. This information is not intended to replace advice given to you by your health care provider. Make sure you discuss any questions you have with your health care provider. Document Revised: 10/16/2018 Document Reviewed: 03/27/2018 Elsevier Patient Education  [place]. Muscle Cramps and Spasms Muscle cramps and spasms occur when a muscle or muscles tighten and you have no control over this tightening (involuntary muscle contraction). They are a common problem and can develop in any muscle. The most common place is in the calf muscles of the leg. Muscle cramps and muscle spasms are both involuntary muscle contractions, but there are some differences between the two:  Muscle cramps are painful. They come and go and may last for a few seconds or up to 15 minutes. Muscle cramps are often more forceful and last longer than muscle spasms.  Muscle spasms may or may not be painful. They may also last just a few seconds or much longer. Certain medical conditions, such as diabetes or Parkinson's disease, can make it more likely to develop cramps or spasms. However, cramps or spasms are usually not caused by a serious  underlying problem. Common causes include:  Doing more physical work or  exercise than your body is ready for (overexertion).  Overuse from repeating certain movements too many times.  Remaining in a certain position for a long period of time.  Improper preparation, form, or technique while playing a sport or doing an activity.  Dehydration.  Injury.  Side effects of some medicines.  Abnormally low levels of the salts and minerals in your blood (electrolytes), especially potassium and calcium. This could happen if you are taking water pills (diuretics) or if you are pregnant. In many cases, the cause of muscle cramps or spasms is not known. Follow these instructions at home: Managing pain and stiffness      Try massaging, stretching, and relaxing the affected muscle. Do this for several minutes at a time.  If directed, apply heat to tight or tense muscles as often as told by your health care provider. Use the heat source that your health care provider recommends, such as a moist heat pack or a heating pad. ? Place a towel between your skin and the heat source. ? Leave the heat on for 20-30 minutes. ? Remove the heat if your skin turns bright red. This is especially important if you are unable to feel pain, heat, or cold. You may have a greater risk of getting burned.  If directed, put ice on the affected area. This may help if you are sore or have pain after a cramp or spasm. ? Put ice in a plastic bag. ? Place a towel between your skin and the bag. ? Leavethe ice on for 20 minutes, 2-3 times a day.  Try taking hot showers or baths to help relax tight muscles. Eating and drinking  Drink enough fluid to keep your urine pale yellow. Staying well hydrated may help prevent cramps or spasms.  Eat a healthy diet that includes plenty of nutrients to help your muscles function. A healthy diet includes fruits and vegetables, lean protein, whole grains, and low-fat or nonfat dairy  products. General instructions  If you are having frequent cramps, avoid intense exercise for several days.  Take over-the-counter and prescription medicines only as told by your health care provider.  Pay attention to any changes in your symptoms.  Keep all follow-up visits as told by your health care provider. This is important. Contact a health care provider if:  Your cramps or spasms get more severe or happen more often.  Your cramps or spasms do not improve over time. Summary  Muscle cramps and spasms occur when a muscle or muscles tighten and you have no control over this tightening (involuntary muscle contraction).  The most common place for cramps or spasms to occur is in the calf muscles of the leg.  Massaging, stretching, and relaxing the affected muscle may relieve the cramp or spasm.  Drink enough fluid to keep your urine pale yellow. Staying well hydrated may help prevent cramps or spasms. This information is not intended to replace advice given to you by your health care provider. Make sure you discuss any questions you have with your health care provider. Document Revised: 11/19/2017 Document Reviewed: 11/19/2017 Elsevier Patient Education  [place].

## 2020-06-24 NOTE — Progress Notes (Signed)
Subjective:    Patient ID: Kelly Reed, female    DOB: 02/01/1966, 54 y.o.   MRN: [id number]  54y/o married caucasian female established patient here for muscle relaxer refill.  Hasn't been able to see chiropractor in months. Neck range of motion decreased extension the worst but can't turn side to side as well either Neck spasms/pain worse due to increased workload/lifting at work with holiday season.  Denied any tingling/numbness/weakness extremities.  I really need him to do some deep tissue work.  I have been applying biofreeze/icy hot and taking epsom salt baths but can't sleep at night this week.  Had to take a tylenol pm last night but then had hard time waking up and groggy this morning.  Has taken flexeril in the past with good results last use 2014 per paper chart review located onsite with [facility] dispenses.  Denied loss of bowel/bladder control/saddle paresthesias/limb weakness.  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  Tylenol 1000mg  po prn helps some, along with her chair massager and spouse helping to put on biofreeze/some massage.     Review of Systems  Constitutional: Positive for activity change and fatigue. Negative for appetite change, chills, diaphoresis and fever.  HENT: Negative for trouble swallowing and voice change.   Eyes: Negative for photophobia and visual disturbance.  Respiratory: Negative for cough, shortness of breath, wheezing and stridor.   Gastrointestinal: Negative for diarrhea, nausea and vomiting.  Endocrine: Negative for cold intolerance and heat intolerance.  Genitourinary: Negative for difficulty urinating and enuresis.  Musculoskeletal: Positive for myalgias, neck pain and neck stiffness. Negative for gait problem.  Skin: Negative for rash and wound.  Allergic/Immunologic: Positive for environmental allergies and immunocompromised state. Negative for food allergies.  Neurological: Negative for dizziness, tremors, seizures,  syncope, facial asymmetry, speech difficulty, weakness, light-headedness, numbness and headaches.  Hematological: Negative for adenopathy. Does not bruise/bleed easily.  Psychiatric/Behavioral: Positive for sleep disturbance. Negative for agitation and confusion.       Objective:   Physical Exam Vitals reviewed.  Constitutional:      General: She is awake. She is not in acute distress.    Appearance: Normal appearance. She is well-developed and well-groomed. She is not ill-appearing, toxic-appearing or diaphoretic.  HENT:     Head: Normocephalic and atraumatic.     Jaw: There is normal jaw occlusion.     Salivary Glands: Right salivary gland is not diffusely enlarged. Left salivary gland is not diffusely enlarged.     Right Ear: Hearing and external ear normal.     Left Ear: Hearing and external ear normal.     Nose: Nose normal. No congestion or rhinorrhea.     Mouth/Throat:     Pharynx: Oropharynx is clear.  Eyes:     General: Lids are normal. Vision grossly intact. Gaze aligned appropriately. Allergic shiner present. No visual field deficit or scleral icterus.       Right eye: No discharge.        Left eye: No discharge.     Extraocular Movements: Extraocular movements intact.     Conjunctiva/sclera: Conjunctivae normal.     Pupils: Pupils are equal, round, and reactive to light.  Neck:     Thyroid: No thyroid tenderness.     Vascular: Normal carotid pulses.     Trachea: Trachea and phonation normal. No tracheal tenderness or tracheal deviation.     Comments: Rotation 30 degrees bilaterally; flexion 20 degrees and extension 5 degrees before  she stops due to pain Cardiovascular:     Rate and Rhythm: Normal rate and regular rhythm.     Pulses: Normal pulses.          Radial pulses are 2+ on the right side and 2+ on the left side.  Pulmonary:     Effort: Pulmonary effort is normal. No respiratory distress.     Breath sounds: Normal breath sounds and air entry. No stridor or  transmitted upper airway sounds. No wheezing or rhonchi.     Comments: Spoke full sentences without difficulty; wearing mask due to covid 19 pandemic; no cough observed in clinic Abdominal:     General: Abdomen is flat.  Musculoskeletal:        General: Tenderness present. No swelling, deformity or signs of injury.     Right shoulder: Tenderness present. No swelling, deformity, effusion, laceration or crepitus. Normal range of motion. Normal strength.     Left shoulder: Tenderness present. No swelling, deformity, effusion, laceration or crepitus. Normal range of motion. Normal strength.     Right elbow: No swelling, deformity, effusion or lacerations. Normal range of motion.     Left elbow: No swelling, deformity, effusion or lacerations. Normal range of motion.     Right hand: No swelling, deformity or lacerations. Normal range of motion.     Left hand: No swelling, deformity or lacerations. Normal range of motion.     Cervical back: Tenderness present. No edema, erythema, signs of trauma, rigidity, torticollis or crepitus. Pain with movement and muscular tenderness present. No spinous process tenderness. Decreased range of motion.     Comments: Bilateral trapezius/STRAP/paraspinals tight c-spine all and t-spine t1-2; TTP and trigger points very painful to palpation; patient voiced she did not want me to perform any massage as too painful. Refused [facility] application as leaving to go home soon and planning to sit in epsom salt bath ASAP  Lymphadenopathy:     Cervical: No cervical adenopathy.     Right cervical: No superficial or posterior cervical adenopathy.    Left cervical: No superficial or posterior cervical adenopathy.  Skin:    General: Skin is warm and dry.     Capillary Refill: Capillary refill takes less than 2 seconds.     Coloration: Skin is not ashen, cyanotic, jaundiced, mottled, pale or sallow.     Findings: No abrasion, abscess, acne, bruising, burn, ecchymosis, erythema,  signs of injury, laceration, lesion, petechiae, rash or wound.     Nails: There is no clubbing.  Neurological:     General: No focal deficit present.     Mental Status: She is alert and oriented to person, place, and time. Mental status is at baseline.     GCS: GCS eye subscore is 4. GCS verbal subscore is 5. GCS motor subscore is 6.     Cranial Nerves: Cranial nerves are intact. No cranial nerve deficit, dysarthria or facial asymmetry.     Sensory: Sensation is intact. No sensory deficit.     Motor: Motor function is intact. No weakness, tremor, atrophy, abnormal muscle tone or seizure activity.     Coordination: Coordination is intact. Coordination normal.     Gait: Gait is intact. Gait normal.     Comments: In/out of chair without difficulty; bilateral hand grasp equal 5/5; gait sure and steady in clinic  Psychiatric:        Attention and Perception: Attention and perception normal.        Mood and Affect: Mood and affect  normal.        Speech: Speech normal.        Behavior: Behavior normal. Behavior is cooperative.        Thought Content: Thought content normal.        Cognition and Memory: Cognition and memory normal.        Judgment: Judgment normal.           Assessment & Plan:  A-neck muscle spasms and neck strain initial visit  P-cyclobenazeprine/flexeril 10mg  take 1/2-1 po TID prn muscle spasms #30 RF0 dispensed from [facility].  Tylenol 1000mg  po q6h prn pain OTC  Avoid alcohol intake and driving while taking cyclobenazeprine/flexeril as drowsiness common side effect.  Slow position changes as medication also lower blood pressure.  Home stretches demonstrated to patient-e.g. Arm circles, walking up wall, chest stretches, neck AROM, chin tucks, knee to chest and rock side to side on back. Self massage or professional prn, foam roller use or lacrosse (deep tissue) tennis/racquetball.  Heat/cryotherapy 15 minutes QID prn. Consider [employer] patch at work.  Biofreeze gel apply QID  prn.   Consider physical therapy referral if no improvement with prescribed therapy from [facility] and/or chiropractic care.  Ensure ergonomics correct desk at work avoid repetitive motions if possible/holding phone/laptop in hand use desk/stand and/or break up lifting items into smaller loads/weights.  Patient was instructed to rest, ice, and ROM exercises.  Activity as tolerated.   Follow up if symptoms persist or worsen especially if loss of bowel/bladder control, arm/leg weakness and/or saddle paresthesias.  Exitcare handout on muscle spasms and cervical strain rehab exercises.  Patient verbalized agreement and understanding of treatment plan and had no further questions at this time.  P2:  Injury Prevention and Fitness.

## 2020-06-24 NOTE — Telephone Encounter (Signed)
Patient was able to come to clinic for appt/evaluation.  tcon closed.

## 2020-07-07 ENCOUNTER — Encounter: Payer: Self-pay | Admitting: Family Medicine

## 2020-07-07 ENCOUNTER — Ambulatory Visit: Payer: PRIVATE HEALTH INSURANCE | Admitting: Family Medicine

## 2020-07-07 ENCOUNTER — Encounter: Payer: PRIVATE HEALTH INSURANCE | Admitting: Family Medicine

## 2020-07-07 ENCOUNTER — Other Ambulatory Visit: Payer: Self-pay

## 2020-07-07 VITALS — BP 142/84 | HR 85 | Temp 97.7°F | Ht 71.0 in | Wt 212.0 lb

## 2020-07-07 DIAGNOSIS — K219 Gastro-esophageal reflux disease without esophagitis: Secondary | ICD-10-CM | POA: Diagnosis not present

## 2020-07-07 DIAGNOSIS — F32A Depression, unspecified: Secondary | ICD-10-CM

## 2020-07-07 DIAGNOSIS — F419 Anxiety disorder, unspecified: Secondary | ICD-10-CM | POA: Diagnosis not present

## 2020-07-07 DIAGNOSIS — I10 Essential (primary) hypertension: Secondary | ICD-10-CM

## 2020-07-07 DIAGNOSIS — E1165 Type 2 diabetes mellitus with hyperglycemia: Secondary | ICD-10-CM

## 2020-07-07 MED ORDER — OMEPRAZOLE 20 MG PO CPDR
DELAYED_RELEASE_CAPSULE | ORAL | 0 refills | Status: DC
Start: 2020-07-07 — End: 2020-09-23

## 2020-07-07 MED ORDER — ESCITALOPRAM OXALATE 10 MG PO TABS: 10.0000 mg | ORAL_TABLET | Freq: Every day | ORAL | 3 refills | Status: DC

## 2020-07-07 MED ORDER — HYDROCHLOROTHIAZIDE 25 MG PO TABS: 25.0000 mg | ORAL_TABLET | Freq: Every day | ORAL | 3 refills | Status: DC

## 2020-07-07 NOTE — Progress Notes (Signed)
12/29/20219:05 AM  Kelly Reed 06/01/1966, 54 y.o., female [id number]  Chief Complaint  Patient presents with  . Diabetes    Follow up   . Depression    Anxiety - previously medically treated , would like to try a different med. Screenings in chart    HPI:   Patient is a 54 y.o. female with past medical history significant for asthma, HTN, DM2, seasonal allergies, HLD,anxietywho presents todayfor routine follow-up.  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 Needs to go to chiropractor  Diabetes Metformin 1840 mg bid Trulicity .75 Lab Results  Component Value Date   HGBA1C 9.1 (H) 04/08/2020   HTN Losartan 180m BP goal< 130/80 BP Readings from Last 3 Encounters:  07/07/20 (!) 142/84  06/24/20 (!) 139/93  04/22/20 (!) 146/93   HLD Crestor 571m ( 3 times a week) Lab Results  Component Value Date   CHOL 167 04/08/2020   HDL 66 04/08/2020   LDLCALC 74 04/08/2020   LDLDIRECT 109.2 01/30/2011   TRIG 158 (H) 04/08/2020   CHOLHDL 2.5 04/08/2020   Allergies/Asthma Take weekly allergy shots Singulair Cetrizine  Flonase Wixela bid Albuterol (2-3 times per month)  Depression/Anxiety Worse since end of sept PHQ-9=11 Was previously on medication for this Last on in 2019 unsure what medication   Colonoscopy: 01/09/20 (7 years) Mammogram in October birad 1   Health Maintenance  Topic Date Due  . OPHTHALMOLOGY EXAM  01/27/2019  . MAMMOGRAM  04/14/2019  . HEMOGLOBIN A1C  10/06/2020  . FOOT EXAM  07/07/2021  . PAP SMEAR-Modifier  02/15/2022  . COLONOSCOPY (Pts [id number] coverage will need to be confirmed)  01/09/2027  . TETANUS/TDAP  02/16/2027  . INFLUENZA VACCINE  Completed  . PNEUMOCOCCAL POLYSACCHARIDE VACCINE AGE 68-64 HIGH RISK  Completed  . COVID-19 Vaccine  Completed  . Hepatitis C Screening  Completed  . HIV Screening  Completed     Dep[facility] screen [facility] 07/07/2020 04/08/2020 10/31/2019  Decreased Interest 3 0  0  Down, Depressed, Hopeless 1 0 0  PHQ - 2 Score 4 0 0  Altered sleeping 3 - -  Tired, decreased energy 3 - -  Change in appetite 0 - -  Feeling bad or failure about yourself  1 - -  Trouble concentrating 0 - -  Moving slowly or fidgety/restless 0 - -  Suicidal thoughts 0 - -  PHQ-9 Score 11 - -  Difficult doing work/chores Somewhat difficult - -  Some recent data might be hidden    Fall Risk  07/07/2020 04/08/2020 10/31/2019 08/15/2019 05/09/2019  Falls in the past year? 0 0 0 0 0  Number falls in past yr: 0 0 0 0 0  Injury with Fall? 0 0 0 0 0  Comment - - - - -  Follow up Falls evaluation completed Falls evaluation completed Falls evaluation completed Falls evaluation completed -     Allergies  Allergen Reactions  . Adhesive [Tape] Rash and Other (See Comments)    Peeling of skin    Prior to Admission medications   Medication Sig Start Date End Date Taking? Authorizing Provider  ACCU-CHEK FASTCLIX LANCETS MISC U UTD TO CHECK BG TID 05/11/18  Yes [provider]  ACCU-CHEK GUIDE test strip U UTD TO CHECK BG TID 04/30/18  Yes [provider]  albuterol (VENTOLIN HFA) 108 (90 Base) MCG/ACT inhaler INHALE 2 PUFFS BY MOUTH INTO THE LUNGS EVERY 6 HOURS AS NEEDED WHEEZING OR  SHORTNESS OF BREATH 04/29/19  Yes Wendall Mola, NP  aspirin EC 81 MG tablet Take 1 tablet (81 mg total) by mouth daily. 08/07/14  Yes Shawnee Knapp, MD  AUVI-Q 0.3 MG/0.3ML SOAJ injection Inject into the muscle as directed. 03/05/20  Yes [provider]  Blood Glucose Monitoring Suppl (ACCU-CHEK GUIDE ME) w/Device KIT U UTD TO CHECK BG TID 05/11/18  Yes [provider]  Dulaglutide (TRULICITY) 8.25 OI/3.7CW SOPN Inject 0.75 mg into the skin once a week. 04/20/20  Yes Jacelyn Pi, Irma M, MD  Epinastine HCl 0.05 % ophthalmic solution Place 1 drop into both eyes 2 (two) times daily. 09/21/16  Yes Shawnee Knapp, MD  levocetirizine (XYZAL) 5 MG tablet Take 1 tablet by mouth every  evening.  03/08/17  Yes [provider]  losartan (COZAAR) 100 MG tablet TAKE 1 TABLET BY MOUTH ONCE DAILY 05/31/20  Yes Ronnisha Felber, Laurita Quint, FNP  MAGNESIUM PO Take by mouth.   Yes [provider]  metFORMIN (GLUCOPHAGE) 1000 MG tablet TAKE 1 TABLET(1000 MG) BY MOUTH TWICE DAILY WITH A MEAL 03/22/20  Yes Jacelyn Pi, Irma M, MD  montelukast (SINGULAIR) 10 MG tablet TAKE 1 TABLET(10 MG) BY MOUTH AT BEDTIME 04/29/20  Yes Maelle Sheaffer, Laurita Quint, FNP  omeprazole (PRILOSEC) 20 MG capsule TAKE 1 CAPSULE BY MOUTH 2 TIMES DAILY BEFORE A MEAL. 05/31/20  Yes Syler Norcia, Laurita Quint, FNP  sodium chloride (OCEAN) 0.65 % SOLN nasal spray Place 1 spray into both nostrils every 2 (two) hours while awake. 04/15/19  Yes Betancourt, Aura Fey, NP  vitamin C (ASCORBIC ACID) 500 MG tablet Take 500 mg by mouth daily.   Yes [provider]  Grant Ruts INHUB 250-50 MCG/DOSE AEPB Inhale 1 puff into the lungs 2 (two) times daily. 03/27/19  Yes [provider]  fluticasone (FLONASE) 50 MCG/ACT nasal spray Place 1 spray into both nostrils 2 (two) times daily. 04/15/19 04/22/20  Betancourt, Aura Fey, NP  rosuvastatin (CRESTOR) 5 MG tablet Take 1 tablet (5 mg total) by mouth 3 (three) times a week. 03/17/20 06/15/20  Daleen Squibb, MD    Past Medical History:  Diagnosis Date  . Allergy   . Anemia   . Anxiety   . Asthma   . COVID-19 04/29/2019  . COVID-19 virus detected 04/11/2019  . Diabetes mellitus type II   . GERD (gastroesophageal reflux disease)   . Hyperlipidemia   . Hypertension   . NSVD (normal spontaneous vaginal delivery)    x 2    Past Surgical History:  Procedure Laterality Date  . GANGLION CYST EXCISION     left foot  . 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 History of Abd. ultrasound  12/04   negative  . Surg. eval lap cholecstectomy  05/28/2006   deferred by patient  . TUBAL LIGATION  1993   bilateral    Social History   Tobacco Use  . Smoking status: Never Smoker   . Smokeless tobacco: Never Used  Substance Use Topics  . Alcohol use: Yes    Alcohol/week: 1.0 - 2.0 standard drink    Types: 1 - 2 Standard drinks or equivalent per week    Comment: Occasional beer    Family History  Problem Relation Age of Onset  . Breast cancer Mother 95       pt's mother passed away from breast cancer when pt was 54 yo  . Diabetes Maternal Grandmother   . Colon cancer  Neg Hx   . Esophageal cancer Neg Hx   . Rectal cancer Neg Hx   . Stomach cancer Neg Hx     Review of Systems  Constitutional: Negative for chills, fever and malaise/fatigue.  Eyes: Negative for blurred vision and double vision.  Respiratory: Negative for cough, shortness of breath and wheezing.   Cardiovascular: Negative for chest pain, palpitations and leg swelling.  Gastrointestinal: Negative for abdominal pain, blood in stool, constipation, diarrhea, heartburn, nausea and vomiting.  Genitourinary: Negative for dysuria, frequency and hematuria.  Musculoskeletal: Positive for back pain and joint pain (rotator cuff bilateral).  Skin: Negative for rash.  Neurological: Negative for dizziness, weakness and headaches.     OBJECTIVE:  Today's Vitals   07/07/20 0757  BP: (!) 142/84  Pulse: 85  Temp: 97.7 F (36.5 C)  SpO2: 98%  Weight: 212 lb (96.2 kg)  Height: _0  (1.803 m)   Body mass index is 29.57 kg/m.   Physical Exam Constitutional:      General: She is not in acute distress.    Appearance: Normal appearance. She is not ill-appearing.  HENT:     Head: Normocephalic.  Cardiovascular:     Rate and Rhythm: Normal rate and regular rhythm.     Pulses: Normal pulses.     Heart sounds: Normal heart sounds. No murmur heard. No friction rub. No gallop.   Pulmonary:     Effort: Pulmonary effort is normal. No respiratory distress.     Breath sounds: Normal breath sounds. No stridor. No wheezing, rhonchi or rales.  Abdominal:     General: Bowel sounds are normal.      Palpations: Abdomen is soft.     Tenderness: There is no abdominal tenderness.  Musculoskeletal:     Right shoulder: Decreased range of motion.     Left shoulder: Decreased range of motion.     Cervical back: Normal.     Thoracic back: Normal.     Lumbar back: Normal.     Right lower leg: No edema.     Left lower leg: No edema.  Skin:    General: Skin is warm and dry.  Neurological:     Mental Status: She is alert and oriented to person, place, and time.  Psychiatric:        Mood and Affect: Mood normal.        Behavior: Behavior normal.     No results found for this or any previous visit (from the past 24 hour(s)).  No results found.   ASSESSMENT and PLAN  Problem List Items Addressed This Visit      Cardiovascular and Mediastinum   Essential hypertension, benign - Primary   Relevant Medications   hydrochlorothiazide (HYDRODIURIL) 25 MG tablet   Other Relevant Orders   CMP14+EGFR  BP not at goal <130/80 Add HCTZ 25 to Losartan 100 mg daily     Endocrine   Diabetes mellitus, type II (HCC)   Relevant Orders   Hemoglobin A1c  Currently on Max dose Metformin Discussed increasing Trulicity if H4L is not at goal      Other   Anxiety and depression (Chronic)   Relevant Medications   escitalopram (LEXAPRO) 10 MG tablet   Other Relevant Orders   Vitamin D, 25-hydroxy  Safety numbers provided Will followup in  weeks    Other Visit Diagnoses    Gastroesophageal reflux disease, unspecified whether esophagitis present       Relevant Medications   omeprazole (PRILOSEC) 20  MG capsule Stable on current regimen       Return in about 5 weeks (around 08/11/2020) for Video visit for medication follow up.    Huston Foley Gatsby Chismar, FNP-BC [facility] [address], [place] [address] Ph.  [phone] Fax [phone]

## 2020-07-07 NOTE — Patient Instructions (Addendum)
  Health Maintenance, Female Adopting a healthy lifestyle and getting preventive care are important in promoting health and wellness. Ask your health care provider about:  The right schedule for you to have regular tests and exams.  Things you can do on your own to prevent diseases and keep yourself healthy. What should I know about diet, weight, and exercise? Eat a healthy diet   Eat a diet that includes plenty of vegetables, fruits, low-fat dairy products, and lean protein.  Do not eat a lot of foods that are high in solid fats, added sugars, or sodium. Maintain a healthy weight Body mass index (BMI) is used to identify weight problems. It estimates body fat based on height and weight. Your health care provider can help determine your BMI and help you achieve or maintain a healthy weight. Get regular exercise Get regular exercise. This is one of the most important things you can do for your health. Most adults should:  Exercise for at least 150 minutes each week. The exercise should increase your heart rate and make you sweat (moderate-intensity exercise).  Do strengthening exercises at least twice a week. This is in addition to the moderate-intensity exercise.  Spend less time sitting. Even light physical activity can be beneficial. Watch cholesterol and blood lipids Have your blood tested for lipids and cholesterol at 56 years of age, then have this test every 5 years. Have your cholesterol levels checked more often if:  Your lipid or cholesterol levels are high.  You are older than 56 years of age.  You are at high risk for heart disease. What should I know about cancer screening? Depending on your health history and family history, you may need to have cancer screening at various ages. This may include screening for:  Breast cancer.  Cervical cancer.  Colorectal cancer.  Skin cancer.  Lung cancer. What should I know about heart disease, diabetes, and high blood  pressure? Blood pressure and heart disease  High blood pressure causes heart disease and increases the risk of stroke. This is more likely to develop in people who have high blood pressure readings, are of African descent, or are overweight.  Have your blood pressure checked: ? Every 3-5 years if you are 18-39 years of age. ? Every year if you are 40 years old or older. Diabetes Have regular diabetes screenings. This checks your fasting blood sugar level. Have the screening done:  Once every three years after age 40 if you are at a normal weight and have a low risk for diabetes.  More often and at a younger age if you are overweight or have a high risk for diabetes. What should I know about preventing infection? Hepatitis B If you have a higher risk for hepatitis B, you should be screened for this virus. Talk with your health care provider to find out if you are at risk for hepatitis B infection. Hepatitis C Testing is recommended for:  Everyone born from 1945 through 1965.  Anyone with known risk factors for hepatitis C. Sexually transmitted infections (STIs)  Get screened for STIs, including gonorrhea and chlamydia, if: ? You are sexually active and are younger than 56 years of age. ? You are older than 56 years of age and your health care provider tells you that you are at risk for this type of infection. ? Your sexual activity has changed since you were last screened, and you are at increased risk for chlamydia or gonorrhea. Ask your health care   provider if you are at risk.  Ask your health care provider about whether you are at high risk for HIV. Your health care provider may recommend a prescription medicine to help prevent HIV infection. If you choose to take medicine to prevent HIV, you should first get tested for HIV. You should then be tested every 3 months for as long as you are taking the medicine. Pregnancy  If you are about to stop having your period (premenopausal) and  you may become pregnant, seek counseling before you get pregnant.  Take 400 to 800 micrograms (mcg) of folic acid every day if you become pregnant.  Ask for birth control (contraception) if you want to prevent pregnancy. Osteoporosis and menopause Osteoporosis is a disease in which the bones lose minerals and strength with aging. This can result in bone fractures. If you are 65 years old or older, or if you are at risk for osteoporosis and fractures, ask your health care provider if you should:  Be screened for bone loss.  Take a calcium or vitamin D supplement to lower your risk of fractures.  Be given hormone replacement therapy (HRT) to treat symptoms of menopause. Follow these instructions at home: Lifestyle  Do not use any products that contain nicotine or tobacco, such as cigarettes, e-cigarettes, and chewing tobacco. If you need help quitting, ask your health care provider.  Do not use street drugs.  Do not share needles.  Ask your health care provider for help if you need support or information about quitting drugs. Alcohol use  Do not drink alcohol if: ? Your health care provider tells you not to drink. ? You are pregnant, may be pregnant, or are planning to become pregnant.  If you drink alcohol: ? Limit how much you use to 0-1 drink a day. ? Limit intake if you are breastfeeding.  Be aware of how much alcohol is in your drink. In the [place] one drink equals one 12 oz bottle of beer (355 mL), one 5 oz glass of wine (148 mL), or one 1 oz glass of hard liquor (44 mL). General instructions  Schedule regular health, dental, and eye exams.  Stay current with your vaccines.  Tell your health care provider if: ? You often feel depressed. ? You have ever been abused or do not feel safe at home. Summary  Adopting a healthy lifestyle and getting preventive care are important in promoting health and wellness.  Follow your health care provider's instructions about healthy  diet, exercising, and getting tested or screened for diseases.  Follow your health care provider's instructions on monitoring your cholesterol and blood pressure. This information is not intended to replace advice given to you by your health care provider. Make sure you discuss any questions you have with your health care provider. Document Revised: 06/19/2018 Document Reviewed: 06/19/2018 Elsevier Patient Education  2020 [employer]   If you have lab work done today you will be contacted with your lab results within the next 2 weeks.  If you have not heard from us then please contact us. The fastest way to get your results is to register for My Chart.   IF you received an x-ray today, you will receive an invoice from [facility]. Please contact [facility] at [phone] with questions or concerns regarding your invoice.   IF you received labwork today, you will receive an invoice from [facility]. Please contact [facility] at [phone] with questions or concerns regarding your invoice.   Our billing staff   will not be able to assist you with questions regarding bills from these companies.  You will be contacted with the lab results as soon as they are available. The fastest way to get your results is to activate your My Chart account. Instructions are located on the last page of this paperwork. If you have not heard from us regarding the results in 2 weeks, please contact this office.      

## 2020-07-08 ENCOUNTER — Other Ambulatory Visit: Payer: Self-pay | Admitting: Family Medicine

## 2020-07-08 DIAGNOSIS — E1165 Type 2 diabetes mellitus with hyperglycemia: Secondary | ICD-10-CM

## 2020-07-08 LAB — CMP14+EGFR
ALT: 70 IU/L — ABNORMAL HIGH (ref 0–32)
AST: 46 IU/L — ABNORMAL HIGH (ref 0–40)
Albumin/Globulin Ratio: 1.9 (ref 1.2–2.2)
Albumin: 4.7 g/dL (ref 3.8–4.9)
Alkaline Phosphatase: 83 IU/L (ref 44–121)
BUN/Creatinine Ratio: 16 (ref 9–23)
BUN: 11 mg/dL (ref 6–24)
Bilirubin Total: 0.4 mg/dL (ref 0.0–1.2)
CO2: 22 mmol/L (ref 20–29)
Calcium: 9.7 mg/dL (ref 8.7–10.2)
Chloride: 102 mmol/L (ref 96–106)
Creatinine, Ser: 0.7 mg/dL (ref 0.57–1.00)
GFR calc Af Amer: 112 mL/min/{1.73_m2} (ref >59)
GFR calc non Af Amer: 97 mL/min/{1.73_m2} (ref >59)
Globulin, Total: 2.5 g/dL (ref 1.5–4.5)
Glucose: 141 mg/dL — ABNORMAL HIGH (ref 65–99)
Potassium: 4.1 mmol/L (ref 3.5–5.2)
Sodium: 138 mmol/L (ref 134–144)
Total Protein: 7.2 g/dL (ref 6.0–8.5)

## 2020-07-08 LAB — HEMOGLOBIN A1C
Est. average glucose Bld gHb Est-mCnc: 180 mg/dL
Hgb A1c MFr Bld: 7.9 % — ABNORMAL HIGH (ref 4.8–5.6)

## 2020-07-08 LAB — VITAMIN D 25 HYDROXY (VIT D DEFICIENCY, FRACTURES): Vit D, 25-Hydroxy: 35.8 ng/mL (ref 30.0–100.0)

## 2020-07-08 MED ORDER — TRULICITY 1.5 MG/0.5ML ~~LOC~~ SOAJ: 1.5000 mg | SUBCUTANEOUS | 15 refills | Status: DC

## 2020-07-12 ENCOUNTER — Other Ambulatory Visit: Payer: Self-pay | Admitting: Family Medicine

## 2020-07-16 ENCOUNTER — Telehealth: Payer: Self-pay | Admitting: *Deleted

## 2020-07-16 ENCOUNTER — Other Ambulatory Visit: Payer: Self-pay

## 2020-07-16 ENCOUNTER — Encounter: Payer: Self-pay | Admitting: *Deleted

## 2020-07-16 ENCOUNTER — Ambulatory Visit: Payer: Self-pay | Admitting: *Deleted

## 2020-07-16 VITALS — BP 130/91 | HR 97 | Temp 99.3°F

## 2020-07-16 DIAGNOSIS — J4530 Mild persistent asthma, uncomplicated: Secondary | ICD-10-CM | POA: Insufficient documentation

## 2020-07-16 DIAGNOSIS — R0981 Nasal congestion: Secondary | ICD-10-CM

## 2020-07-16 DIAGNOSIS — J3081 Allergic rhinitis due to animal (cat) (dog) hair and dander: Secondary | ICD-10-CM | POA: Insufficient documentation

## 2020-07-16 DIAGNOSIS — H9203 Otalgia, bilateral: Secondary | ICD-10-CM

## 2020-07-16 NOTE — Telephone Encounter (Signed)
Reviewed RN Hildred Alamin note agree with plan of care will follow up with patient via telephone this weekend. Noted patient has appt scheduled 07/18/2020 with PCM per epic.

## 2020-07-16 NOTE — Progress Notes (Signed)
Pt to clinic reporting bilateral ear pain and sinus pain/pressure.  Sts sx started while at work Tues 1/4. She felt they were related to her allergies. This also made her asthma flare up. She used her albuterol inh that day. That is the last use at this time. Ears then started getting backed up and felt clogged with muffled sounds. Started having dizziness as well from vertigo that she took Meclizine for and left work early Tuesday. Vertigo resolved by Wednesday but frontal HA began. She reports PND with dry cough. Endorses fatigue as well. Denies body aches, fever/chills, n/v/d, loss of taste or smell.   Bilateral ears with air fluid level, slightly cloudy on Left. No erythema, no bulging, no perforation noted. She denies drainage.   She is taking Xyzal, Singulair, Flonase at home. Last used saline Tuesday. Advised her to increase use to q2h while awake and at night in shower as nasal rinse. Start phenylephrine 5-10mg  q4-6h. Provided from clinic OTC stock.  Due to sx that could be r/t allergies, asthma, sinus inf, or Covid, pt sent home to complete quarantine. HR made aware of same.   Day 0 1/4 Day 5 1/9 Was able to locate Covid testing on Sunday 1/9 at 1400 at [place].  RTW Monday 07/19/20 if sx improving.

## 2020-07-16 NOTE — Telephone Encounter (Signed)
See separate RN encounter for VS.   Pt to clinic reporting bilateral ear pain and sinus pain/pressure.  Sts sx started while at work Tues 1/4. She felt they were related to her allergies. This also made her asthma flare up. She used her albuterol inh that day. That is the last use at this time. Ears then started getting backed up and felt clogged with muffled sounds. Started having dizziness as well from vertigo that she took Meclizine for and left work early Tuesday. Vertigo resolved by Wednesday but frontal HA began. She reports PND with dry cough. Endorses fatigue as well. Denies body aches, fever/chills, n/v/d, loss of taste or smell.   Bilateral ears with air fluid level, slightly cloudy on Left. No erythema, no bulging, no perforation noted. She denies drainage.   She is taking Xyzal, Singulair, Flonase at home. Last used saline Tuesday. Advised her to increase use to q2h while awake and at night in shower as nasal rinse. Start phenylephrine 5-10mg  q4-6h. Provided from clinic OTC stock.  Due to sx that could be r/t allergies, asthma, sinus inf, or Covid, pt sent home to complete quarantine. HR made aware of same.   Day 0 1/4 Day 5 1/9 Was able to locate Covid testing on Sunday 1/9 at 1400 at [place].  RTW Monday 07/19/20 if sx improving.

## 2020-07-17 NOTE — Telephone Encounter (Signed)
Left message calling to see if she required medications prescription will try to call again tomorrow.  She can email [email or web address] if she wants me to call back today and discuss her symptoms.

## 2020-07-18 ENCOUNTER — Other Ambulatory Visit: Payer: PRIVATE HEALTH INSURANCE

## 2020-07-18 ENCOUNTER — Other Ambulatory Visit: Payer: Self-pay

## 2020-07-18 DIAGNOSIS — Z20822 Contact with and (suspected) exposure to covid-19: Secondary | ICD-10-CM

## 2020-07-18 NOTE — Telephone Encounter (Signed)
No answer left message can call back to [phone] today to discuss symptoms or if request for Rx sent in to pharmacy.  RN Hildred Alamin will return to clinic tomorrow [phone].

## 2020-07-19 NOTE — Telephone Encounter (Signed)
Spoke with pt by phone. She reports all sx have resolved except both ears still bothering her some. She denies need for Rx meds. She reports just receiving NP VM yesterday late and was gone for testing at time of call. She did RTW today 1/10 as planned. Strict mask use thru 1/14. Awaiting test results from 1/9.

## 2020-07-19 NOTE — Telephone Encounter (Signed)
Reviewed RN Hildred Alamin note; still having ear pain and testing appt yesterday

## 2020-07-20 LAB — NOVEL CORONAVIRUS, NAA: SARS-CoV-2, NAA: NOT DETECTED

## 2020-07-20 LAB — SARS-COV-2, NAA 2 DAY TAT

## 2020-07-20 NOTE — Telephone Encounter (Signed)
Spoke with by phone this morning. She is at work. Reports she feels well. Denies need for NP appt. Denies any other needs as well. Results from 07/18/20 covid test still pending.

## 2020-07-22 NOTE — Telephone Encounter (Signed)
Covid test back as negative. Pt did RTW on-site 1/10. Called this morning to review result and ensure no further needs. LVMRCB.

## 2020-07-22 NOTE — Telephone Encounter (Signed)
Reviewed RN Hildred Alamin notes and discussed on 07/20/2020 patient denied need for appt verbally.  Negative test results and improving at work.

## 2020-08-11 ENCOUNTER — Telehealth (INDEPENDENT_AMBULATORY_CARE_PROVIDER_SITE_OTHER): Payer: PRIVATE HEALTH INSURANCE | Admitting: Family Medicine

## 2020-08-11 ENCOUNTER — Other Ambulatory Visit: Payer: Self-pay

## 2020-08-11 ENCOUNTER — Encounter: Payer: Self-pay | Admitting: Family Medicine

## 2020-08-11 DIAGNOSIS — I10 Essential (primary) hypertension: Secondary | ICD-10-CM | POA: Diagnosis not present

## 2020-08-11 DIAGNOSIS — F32A Depression, unspecified: Secondary | ICD-10-CM

## 2020-08-11 DIAGNOSIS — E7800 Pure hypercholesterolemia, unspecified: Secondary | ICD-10-CM | POA: Diagnosis not present

## 2020-08-11 DIAGNOSIS — F419 Anxiety disorder, unspecified: Secondary | ICD-10-CM | POA: Diagnosis not present

## 2020-08-11 DIAGNOSIS — E1100 Type 2 diabetes mellitus with hyperosmolarity without nonketotic hyperglycemic-hyperosmolar coma (NKHHC): Secondary | ICD-10-CM

## 2020-08-11 NOTE — Progress Notes (Signed)
Virtual Visit Note  I connected with patient on 08/11/20 at 1546 by telephone due to unable to work Epic video visit and verified that I am speaking with the correct person using two identifiers. Kelly Reed is currently located at home and no family members are currently with them during visit. The provider, Laurita Quint Tondra Reierson, FNP is located in their office at time of visit.  I discussed the limitations, risks, security and privacy concerns of performing an evaluation and management service by telephone and the availability of in person appointments. I also discussed with the patient that there may be a patient responsible charge related to this service. The patient expressed understanding and agreed to proceed.   I provided 20 minutes of non-face-to-face time during this encounter.  Chief Complaint  Patient presents with  . medication follow     Follow up on HCTZ and excitalopram - no concerns reported with either medication     HPI ? DM Max Metformin Last OV inc. Trulicity to 1.5  Been on this dose for 4 weeks No issues Does monitor BG at home Morning: 130-110 Lab Results  Component Value Date   HGBA1C 7.9 (H) 07/07/2020    BP Losartan 100 Started HCTZ 25 Has not been checking home BP Used to the frequency of urination BP Readings from Last 3 Encounters:  07/16/20 (!) 130/91  07/07/20 (!) 142/84  06/24/20 (!) 139/93   HLD Crestor 78m three times a week  Anxiety/Depression Started Lexapro at 10  PHQ 9=0  Feels like she is doing well Denies side effects or issues at this time Happy with this current dose  Saw the nurse at work for issues with allergies Used inhaler Covid negative in Jan   [facility] 08/11/2020 07/07/2020 04/08/2020 10/31/2019 08/15/2019  Decreased Interest 0 3 0 0 0  Down, Depressed, Hopeless 0 1 0 0 0  PHQ - 2 Score 0 4 0 0 0  Altered sleeping 0 3 - - -  Tired, decreased energy 0 3 - - -  Change in appetite 0 0 - - -  Feeling  bad or failure about yourself  0 1 - - -  Trouble concentrating 0 0 - - -  Moving slowly or fidgety/restless 0 0 - - -  Suicidal thoughts 0 0 - - -  PHQ-9 Score 0 11 - - -  Difficult doing work/chores Not difficult at all Somewhat difficult - - -  Some recent data might be hidden    Allergies  Allergen Reactions  . Adhesive [Tape] Rash and Other (See Comments)    Peeling of skin    Prior to Admission medications   Medication Sig Start Date End Date Taking? Authorizing Provider  ACCU-CHEK FASTCLIX LANCETS MISC U UTD TO CHECK BG TID 05/11/18  Yes [provider]  ACCU-CHEK GUIDE test strip U UTD TO CHECK BG TID 04/30/18  Yes [provider]  albuterol (VENTOLIN HFA) 108 (90 Base) MCG/ACT inhaler INHALE 2 PUFFS BY MOUTH INTO THE LUNGS EVERY 6 HOURS AS NEEDED WHEEZING OR SHORTNESS OF BREATH 04/29/19  Yes BWendall Mola NP  aspirin EC 81 MG tablet Take 1 tablet (81 mg total) by mouth daily. 08/07/14  Yes SShawnee Knapp MD  AUVI-Q 0.3 MG/0.3ML SOAJ injection Inject into the muscle as directed. 03/05/20  Yes [provider]  Blood Glucose Monitoring Suppl (ACCU-CHEK GUIDE ME) w/Device KIT U UTD TO CHECK BG TID 05/11/18  Yes [provider]  Dulaglutide (TRULICITY) 1.5 RU/0.4VW SOPN Inject 1.5 mg into the skin once a week. 07/08/20  Yes Zamantha Strebel, Laurita Quint, FNP  Epinastine HCl 0.05 % ophthalmic solution Place 1 drop into both eyes 2 (two) times daily. 09/21/16  Yes Shawnee Knapp, MD  escitalopram (LEXAPRO) 10 MG tablet Take 1 tablet (10 mg total) by mouth daily. 07/07/20  Yes Sidda Humm, Laurita Quint, FNP  hydrochlorothiazide (HYDRODIURIL) 25 MG tablet Take 1 tablet (25 mg total) by mouth daily. 07/07/20  Yes Blue Ruggerio, Laurita Quint, FNP  levocetirizine (XYZAL) 5 MG tablet Take 1 tablet by mouth every evening.  03/08/17  Yes [provider]  losartan (COZAAR) 100 MG tablet TAKE 1 TABLET BY MOUTH ONCE DAILY 07/12/20  Yes Cai Flott, Laurita Quint, FNP  MAGNESIUM PO Take by mouth.   Yes  [provider]  metFORMIN (GLUCOPHAGE) 1000 MG tablet TAKE 1 TABLET(1000 MG) BY MOUTH TWICE DAILY WITH A MEAL 03/22/20  Yes Jacelyn Pi, Irma M, MD  montelukast (SINGULAIR) 10 MG tablet TAKE 1 TABLET(10 MG) BY MOUTH AT BEDTIME 04/29/20  Yes Brei Pociask, Laurita Quint, FNP  omeprazole (PRILOSEC) 20 MG capsule TAKE 1 CAPSULE BY MOUTH 2 TIMES DAILY BEFORE A MEAL. 07/07/20  Yes Bertie Simien, Laurita Quint, FNP  sodium chloride (OCEAN) 0.65 % SOLN nasal spray Place 1 spray into both nostrils every 2 (two) hours while awake. 04/15/19  Yes Betancourt, Aura Fey, NP  vitamin C (ASCORBIC ACID) 500 MG tablet Take 500 mg by mouth daily.   Yes [provider]  Grant Ruts INHUB 250-50 MCG/DOSE AEPB Inhale 1 puff into the lungs 2 (two) times daily. 03/27/19  Yes [provider]  fluticasone (FLONASE) 50 MCG/ACT nasal spray Place 1 spray into both nostrils 2 (two) times daily. 04/15/19 04/22/20  Betancourt, Aura Fey, NP  rosuvastatin (CRESTOR) 5 MG tablet Take 1 tablet (5 mg total) by mouth 3 (three) times a week. 03/17/20 06/15/20  Daleen Squibb, MD    Past Medical History:  Diagnosis Date  . Allergy   . Anemia   . Anxiety   . Asthma   . COVID-19 04/29/2019  . COVID-19 virus detected 04/11/2019  . Diabetes mellitus type II   . GERD (gastroesophageal reflux disease)   . Hyperlipidemia   . Hypertension   . NSVD (normal spontaneous vaginal delivery)    x 2    Past Surgical History:  Procedure Laterality Date  . GANGLION CYST EXCISION     left foot  . 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 History of Abd. ultrasound  12/04   negative  . Surg. eval lap cholecstectomy  05/28/2006   deferred by patient  . TUBAL LIGATION  1993   bilateral    Social History   Tobacco Use  . Smoking status: Never Smoker  . Smokeless tobacco: Never Used  Substance Use Topics  . Alcohol use: Yes    Alcohol/week: 1.0 - 2.0 standard drink    Types: 1 - 2 Standard drinks or equivalent per week     Comment: Occasional beer    Family History  Problem Relation Age of Onset  . Breast cancer Mother 73       pt's mother passed away from breast cancer when pt was 55 yo  . Diabetes Maternal Grandmother   . Colon cancer Neg Hx   . Esophageal cancer Neg Hx   . Rectal cancer Neg Hx   . Stomach cancer Neg Hx     Review of Systems  Constitutional: Negative for chills, fever and malaise/fatigue.  Eyes: Negative for blurred vision and double vision.  Respiratory: Negative for cough, shortness of breath and wheezing.   Cardiovascular: Negative for chest pain, palpitations and leg swelling.  Gastrointestinal: Negative for abdominal pain, blood in stool, constipation, diarrhea, heartburn, nausea and vomiting.  Genitourinary: Negative for dysuria, frequency and hematuria.  Musculoskeletal: Negative for back pain and joint pain.  Skin: Negative for rash.  Neurological: Negative for dizziness, weakness and headaches.  Psychiatric/Behavioral: Negative for depression and suicidal ideas. The patient is not nervous/anxious.     Objective  Constitutional:      General: Not in acute distress.    Appearance: Normal appearance. Not ill-appearing.   Pulmonary:     Effort: Pulmonary effort is normal. No respiratory distress.  Neurological:     Mental Status: Alert and oriented to person, place, and time.  Psychiatric:        Mood and Affect: Mood normal.        Behavior: Behavior normal.     ASSESSMENT and PLAN  Problem List Items Addressed This Visit      Cardiovascular and Mediastinum   Essential hypertension, benign     Endocrine   Diabetes mellitus, type II ([place]) - Primary     Other   Anxiety and depression (Chronic)   Hypercholesteremia      Plan . Overall doing well, chronic conditions stable on current regiments . F/u with labs and BP in 2 months  Return in about 2 months (around 10/09/2020).    The above assessment and management plan was discussed with the patient. The  patient verbalized understanding of and has agreed to the management plan. Patient is aware to call the clinic if symptoms persist or worsen. Patient is aware when to return to the clinic for a follow-up visit. Patient educated on when it is appropriate to go to the emergency department.     Huston Foley Micalah Cabezas, FNP-BC [facility] [place], [place] [address] Ph.  [phone] Fax [phone]

## 2020-08-11 NOTE — Patient Instructions (Signed)
° ° ° °  If you have lab work done today you will be contacted with your lab results within the next 2 weeks.  If you have not heard from us then please contact us. The fastest way to get your results is to register for My Chart. ° ° °IF you received an x-ray today, you will receive an invoice from [facility]. Please contact [facility] at [phone] with questions or concerns regarding your invoice.  ° °IF you received labwork today, you will receive an invoice from [facility]. Please contact [facility] at [phone] with questions or concerns regarding your invoice.  ° °Our billing staff will not be able to assist you with questions regarding bills from these companies. ° °You will be contacted with the lab results as soon as they are available. The fastest way to get your results is to activate your My Chart account. Instructions are located on the last page of this paperwork. If you have not heard from us regarding the results in 2 weeks, please contact this office. °  ° ° ° °

## 2020-08-13 ENCOUNTER — Other Ambulatory Visit: Payer: Self-pay | Admitting: Family Medicine

## 2020-08-23 ENCOUNTER — Other Ambulatory Visit: Payer: Self-pay | Admitting: Family Medicine

## 2020-08-31 ENCOUNTER — Ambulatory Visit: Payer: Self-pay | Admitting: Registered Nurse

## 2020-08-31 ENCOUNTER — Encounter: Payer: Self-pay | Admitting: Registered Nurse

## 2020-08-31 ENCOUNTER — Other Ambulatory Visit: Payer: Self-pay

## 2020-08-31 VITALS — BP 148/93 | HR 96 | Temp 98.8°F

## 2020-08-31 DIAGNOSIS — J4531 Mild persistent asthma with (acute) exacerbation: Secondary | ICD-10-CM

## 2020-08-31 DIAGNOSIS — J019 Acute sinusitis, unspecified: Secondary | ICD-10-CM

## 2020-08-31 DIAGNOSIS — R030 Elevated blood-pressure reading, without diagnosis of hypertension: Secondary | ICD-10-CM

## 2020-08-31 DIAGNOSIS — E1165 Type 2 diabetes mellitus with hyperglycemia: Secondary | ICD-10-CM

## 2020-08-31 MED ORDER — PREDNISONE 10 MG PO TABS: ORAL_TABLET | ORAL | 0 refills | Status: AC

## 2020-08-31 MED ORDER — FLUTICASONE PROPIONATE 50 MCG/ACT NA SUSP: 1.0000 | Freq: Two times a day (BID) | NASAL | 6 refills | Status: DC

## 2020-08-31 NOTE — Progress Notes (Signed)
Subjective:    Patient ID: Kelly Reed, female    DOB: [birth year], 55 y.o.   MRN: [id number]  55 Year old Caucasian female married established patient presents with allergy/asthma flare symptoms since yesterday 02/21. 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 Pain in substernal through to back, which is worse with a deep breath. Reports using inhaler twice yesterday, none today.   Intermittent nonproductive cough today, sinus pain and pressure and intermittent ear pain also.  Using nasal saline BID and taking her allergy medications.  Feels fatigued today denied fever/chills.  Chest pain started after vomiting.     Review of Systems  Constitutional: Negative for activity change, appetite change, chills, diaphoresis, fatigue and fever.  HENT: Positive for congestion, ear pain, sinus pressure and sinus pain. Negative for ear discharge, facial swelling, trouble swallowing and voice change.   Eyes: Negative for photophobia and visual disturbance.  Respiratory: Positive for cough, chest tightness, shortness of breath and wheezing. Negative for stridor.   Cardiovascular: Positive for chest pain. Negative for leg swelling.  Gastrointestinal: Positive for abdominal pain and vomiting. Negative for diarrhea and nausea.  Endocrine: Negative for cold intolerance and heat intolerance.  Genitourinary: Negative for difficulty urinating.  Musculoskeletal: Positive for myalgias. Negative for gait problem, neck pain and neck stiffness.  Skin: Negative for rash.  Allergic/Immunologic: Positive for environmental allergies and immunocompromised state. Negative for food allergies.  Neurological: Negative for dizziness, tremors, seizures, syncope, facial asymmetry, speech difficulty, weakness, light-headedness, numbness and headaches.  Hematological: Negative for  adenopathy. Does not bruise/bleed easily.  Psychiatric/Behavioral: Negative for agitation, confusion and sleep disturbance.       Objective:   Physical Exam Vitals and nursing note reviewed.  Constitutional:      General: She is awake and active. She is not in acute distress.    Appearance: Normal appearance. She is well-developed, well-groomed, overweight and well-nourished. She is ill-appearing. She is not toxic-appearing, sickly-appearing or diaphoretic.  HENT:     Head: Normocephalic and atraumatic.     Jaw: There is normal jaw occlusion. No trismus.     Salivary Glands: Right salivary gland is not diffusely enlarged or tender. Left salivary gland is not diffusely enlarged or tender.     Right Ear: Hearing, ear canal and external ear normal. A middle ear effusion is present. There is no impacted cerumen.     Left Ear: Hearing, ear canal and external ear normal. A middle ear effusion is present. There is no impacted cerumen.     Nose: Mucosal edema and rhinorrhea present. No nasal deformity, septal deviation, laceration, sinus tenderness, congestion, nasal septal hematoma, epistaxis or foreign body.     Right Sinus: Maxillary sinus tenderness and frontal sinus tenderness present.     Left Sinus: Maxillary sinus tenderness and frontal sinus tenderness present.     Comments: Maxillary and frontal sinuses equally TTP bilaterally; cobblestoning posterior pharynx; tonsils 1+/4 bilaterally; bilateral allergic shiners; bilateral TMs air fluid level clear; frequent nasal sniffing and occasional nonproductive mild cough    Mouth/Throat:     Lips: Pink. No lesions.     Mouth: Mucous membranes are normal. Mucous membranes are moist. Mucous membranes are not pale, not dry and not cyanotic. No lacerations, oral lesions or angioedema.     Dentition: No dental abscesses or gum lesions.     Tongue: No  lesions. Tongue does not deviate from midline.     Palate: No mass and lesions.     Pharynx: Uvula  midline. Pharyngeal swelling, posterior oropharyngeal edema and posterior oropharyngeal erythema present. No oropharyngeal exudate or uvula swelling.     Tonsils: No tonsillar exudate or tonsillar abscesses.  Eyes:     General: Lids are normal. Vision grossly intact. Gaze aligned appropriately. Allergic shiner present. No visual field deficit or scleral icterus.       Right eye: No foreign body, discharge or hordeolum.        Left eye: No foreign body, discharge or hordeolum.     Extraocular Movements: EOM normal.     Right eye: Normal extraocular motion and no nystagmus.     Left eye: Normal extraocular motion and no nystagmus.     Conjunctiva/sclera: Conjunctivae normal.     Right eye: Right conjunctiva is not injected. No chemosis, exudate or hemorrhage.    Left eye: Left conjunctiva is not injected. No chemosis, exudate or hemorrhage.    Pupils: Pupils are equal, round, and reactive to light. Pupils are equal.     Right eye: Pupil is round and reactive.     Left eye: Pupil is round and reactive.  Neck:     Thyroid: No thyroid mass, thyromegaly or thyroid tenderness.     Trachea: Trachea and phonation normal. No tracheal tenderness or tracheal deviation.  Cardiovascular:     Rate and Rhythm: Normal rate and regular rhythm.     Chest Wall: PMI is not displaced.     Pulses: Normal pulses and intact distal pulses.          Radial pulses are 2+ on the right side and 2+ on the left side.     Heart sounds: Normal heart sounds, S1 normal and S2 normal. No murmur heard. No friction rub. No gallop.   Pulmonary:     Effort: Pulmonary effort is normal. No accessory muscle usage or respiratory distress.     Breath sounds: Decreased air movement present. No stridor or transmitted upper airway sounds. Examination of the right-lower field reveals decreased breath sounds. Examination of the left-lower field reveals decreased breath sounds. Decreased breath sounds present. No wheezing, rhonchi or rales.      Comments: Wearing cloth mask due to covid 19 pandemic; spoke full sentences without difficulty; mild intermittent nonproductive cough in exam room  Chest:     Chest wall: No tenderness.  Abdominal:     General: Abdomen is flat. There is no distension.     Palpations: Abdomen is soft.  Musculoskeletal:        General: No swelling, tenderness, deformity, signs of injury or edema. Normal range of motion.     Right shoulder: Normal.     Left shoulder: Normal.     Right elbow: Normal.     Left elbow: Normal.     Right hand: Normal.     Left hand: Normal.     Cervical back: Normal range of motion and neck supple. No swelling, edema, deformity, erythema, signs of trauma, lacerations, rigidity, torticollis, tenderness or crepitus. No pain with movement, spinous process tenderness or muscular tenderness. Normal range of motion. Normal.     Thoracic back: No swelling, edema, deformity, signs of trauma, lacerations or spasms. Normal range of motion.     Lumbar back: No swelling, edema, deformity, signs of trauma, lacerations or spasms. Normal range of motion.     Right hip: Normal.  Left hip: Normal.     Right knee: Normal.     Left knee: Normal.  Lymphadenopathy:     Head:     Right side of head: No submental, submandibular, tonsillar, preauricular, posterior auricular or occipital adenopathy.     Left side of head: No submental, submandibular, tonsillar, preauricular, posterior auricular or occipital adenopathy.     Cervical: No cervical adenopathy.     Right cervical: No superficial, deep or posterior cervical adenopathy.    Left cervical: No superficial, deep or posterior cervical adenopathy.  Skin:    General: Skin is warm, dry and intact.     Capillary Refill: Capillary refill takes less than 2 seconds.     Coloration: Skin is not ashen, cyanotic, jaundiced, mottled, pale or sallow.     Findings: No abrasion, abscess, acne, bruising, burn, ecchymosis, erythema, signs of injury,  laceration, lesion, petechiae, rash or wound.     Nails: There is no clubbing or cyanosis.  Neurological:     General: No focal deficit present.     Mental Status: She is alert and oriented to person, place, and time. Mental status is at baseline. She is not disoriented.     GCS: GCS eye subscore is 4. GCS verbal subscore is 5. GCS motor subscore is 6.     Cranial Nerves: Cranial nerves are intact. No cranial nerve deficit, dysarthria or facial asymmetry.     Sensory: Sensation is intact. No sensory deficit.     Motor: Motor function is intact. No weakness, tremor, atrophy, abnormal muscle tone or seizure activity.     Coordination: Coordination is intact. Coordination normal.     Gait: Gait is intact. Gait normal.     Deep Tendon Reflexes: Strength normal.     Comments: On/off exam table without difficulty; gait sure and steady in clinic; bilateral hand grasp equal 5/5  Psychiatric:        Attention and Perception: Attention and perception normal.        Mood and Affect: Mood and affect, mood and affect normal.        Speech: Speech normal.        Behavior: Behavior normal. Behavior is cooperative.        Thought Content: Thought content normal.        Cognition and Memory: Cognition, memory and cognition and memory normal.        Judgment: Judgment normal.           Assessment & Plan:  A-acute asthma exacerbation and acute rhinosinusitis, seasonal allergic rhinitis and elevated blood pressure with history of hypertension  P-Patient may use normal saline nasal spray2 sprays each nostril q2h wa as needed given 1 bottle from clinic stock. flonase 108mcg 1 spray each nostril BID#1 RF6 electronic Rx sent to her pharmacy of choice. Patient denied personal or family history of ENT cancer. OTC antihistamine of choice xyzal 5mg  podailyor benadryl 25mg  po TID prn rhinitis. Avoid triggers if possible. Shower prior to bedtime if exposed to triggers. If allergic dust/dust mites recommend  mattress/pillow covers/encasements; washing linens, vacuuming, sweeping, dusting weekly. Call or return to clinic as needed if these symptoms worsen or fail to improve as anticipated.Patient verbalized understanding of instructions, agreed with plan of care and had no further questions at this time.  P2: Avoidance and hand washing.  Continue flonase 1 spray each nostril BID, saline 2 sprays each nostril q2h wa prn congestion.  Start prednisone taper 10mg  take 3 x 2 days with  breakfast then 2 tabs x 2 days then 1 tab x 2 days #21 RF0 dispensed from [facility] to patient.Denied personal or family history of ENT cancer. Shower BID especially prior to bed. No evidence of systemic bacterial infection, non toxic and well hydrated. I do not see where any further testing or imaging is necessary at this time. I will suggest supportive care, rest, good hygiene and encourage the patient to take adequate fluids. The patient is to return to clinic or EMERGENCY ROOM if symptoms worsen or change significantly. Exitcare handout on sinusitis and sinus rinse. Patient verbalized agreement and understanding of treatment plan and had no further questions at this time.  P2: Hand washing and cover cough  Continue mask wear. Cough lozenges po q2h prn cough Prednisone taper 10mg  (30mg x2d/20mg x2d10mg x2d) po daily with breakfast #21 RF0 dispensed from [facility]. Discussed possible side effects increased/decreased appetite, difficulty sleeping, increased blood sugar, increased blood pressure and heart rate. Albuterol MDI 110mcg 1-2 puffs po q4-6h prn protracted cough/wheeze at homeside effect increased heart rate. Bronchitis simple, community acquired, may have started as viral (probably respiratory syncytial, parainfluenza, influenza, or adenovirus), but now evidence of acute purulent bronchitis with resultant bronchial edema and mucus formation. Viruses are the most common cause of bronchial inflammation in otherwise healthy  adults with acute bronchitis. The appearance of sputum is not predictive of whether a bacterial infection is present. Purulent sputum is most often caused by viral infections. There are a small portion of those caused by non-viral agents being Mycoplama pneumonia. Microscopic examination or C&S of sputum in the healthy adult with acute bronchitis is generally not helpful (usually negative or normal respiratory flora) other considerations being cough from upper respiratory tract infections, sinusitis or allergic syndromes (mild asthma or viral pneumonia). Differential Diagnoses: reactive airway disease (asthma, allergic aspergillosis (eosinophilia), chronic bronchitis, respiratory infection (sinusitis, common cold, pneumonia), congestive heart failure, reflux esophagitis, bronchogenic tumor, aspiration syndromes and/or exposure to pulmonary irritants/smoke.  Discussed with patient covid virus is common in community at this time. Continue to shelter at home when not required at work. Without high fever, severe dyspnea, lack of physical findings or other risk factors, I will hold on a chest radiograph and CBC at this time. I discussed that approximately 50% of patients with acute bronchitis have a cough that lasts up to three weeks, and 25% for over a month. Tylenol500mg one to two tablets every fourto sixhours as needed for fever or myalgias. No aspirin. Exitcare handout on asthma attack prevention, bronchitis and inhaler use. ER if hemopthysis, SOB, worst chest pain of life. Patient instructed to follow up in 48 hours if no improvement in symptoms with plan of care. Patient verbalized agreement and understanding of treatment plan and had no further questions at this time. P2: hand washing and cover cough  Has been working in [employer].  acute URI and elevated blood pressure.   ER if chest pain, worst headache of life, dyspnea or visual changes for re-evaluation.  See RN Hildred Alamin later  this month when feeling better for repeat BP check and off oral steroids.

## 2020-08-31 NOTE — Patient Instructions (Signed)
Acute Bronchitis, Adult  Acute bronchitis is sudden or acute swelling of the air tubes (bronchi) in the lungs. Acute bronchitis causes these tubes to fill with mucus, which can make it hard to breathe. It can also cause coughing or wheezing. In adults, acute bronchitis usually goes away within 2 weeks. A cough caused by bronchitis may last up to 3 weeks. Smoking, allergies, and asthma can make the condition worse. What are the causes? This condition can be caused by germs and by substances that irritate the lungs, including:  Cold and flu viruses. The most common cause of this condition is the virus that causes the common cold.  Bacteria.  Substances that irritate the lungs, including: ? Smoke from cigarettes and other forms of tobacco. ? Dust and pollen. ? Fumes from chemical products, gases, or burned fuel. ? Other materials that pollute indoor or outdoor air.  Close contact with someone who has acute bronchitis. What increases the risk? The following factors may make you more likely to develop this condition:  A weak body's defense system, also called the immune system.  A condition that affects your lungs and breathing, such as asthma. What are the signs or symptoms? Common symptoms of this condition include:  Lung and breathing problems, such as: ? Coughing. This may bring up clear, yellow, or green mucus from your lungs (sputum). ? Wheezing. ? Having too much mucus in your lungs (chest congestion). ? Having shortness of breath.  A fever.  Chills.  Aches and pains, including: ? Tightness in your chest and other body aches. ? A sore throat. How is this diagnosed? This condition is usually diagnosed based on:  Your symptoms and medical history.  A physical exam. You may also have other tests, including tests to rule out other conditions, such pneumonia. These tests include:  A test of lung function.  Test of a mucus sample to look for the presence of  bacteria.  Tests to check the oxygen level in your blood.  Blood tests.  Chest X-ray. How is this treated? Most cases of acute bronchitis clear up over time without treatment. Your health care provider may recommend:  Drinking more fluids. This can thin your mucus, which may improve your breathing.  Taking a medicine for a fever or cough.  Using a device that gets medicine into your lungs (inhaler) to help improve breathing and control coughing.  Using a vaporizer or a humidifier. These are machines that add water to the air to help you breathe better. Follow these instructions at home: Activity  Get plenty of rest.  Return to your normal activities as told by your health care provider. Ask your health care provider what activities are safe for you. Lifestyle  Drink enough fluid to keep your urine pale yellow.  Do not drink alcohol.  Do not use any products that contain nicotine or tobacco, such as cigarettes, e-cigarettes, and chewing tobacco. If you need help quitting, ask your health care provider. Be aware that: ? Your bronchitis will get worse if you smoke or breathe in other people's smoke (secondhand smoke). ? Your lungs will heal faster if you quit smoking. General instructions  Take over-the-counter and prescription medicines only as told by your health care provider.  Use an inhaler, vaporizer, or humidifier as told by your health care provider.  If you have a sore throat, gargle with a salt-water mixture 3-4 times a day or as needed. To make a salt-water mixture, completely dissolve -1 tsp (3-6 g)   of salt in 1 cup (237 mL) of warm water.  Keep all follow-up visits as told by your health care provider. This is important.   How is this prevented? To lower your risk of getting this condition again:  Wash your hands often with soap and water. If soap and water are not available, use hand sanitizer.  Avoid contact with people who have cold symptoms.  Try not to  touch your mouth, nose, or eyes with your hands.  Avoid places where there are fumes from chemicals. Breathing these fumes will make your condition worse.  Get the flu shot every year.   Contact a health care provider if:  Your symptoms do not improve after 2 weeks of treatment.  You vomit more than once or twice.  You have symptoms of dehydration such as: ? Dark urine. ? Dry skin or eyes. ? Increased thirst. ? Headaches. ? Confusion. ? Muscle cramps. Get help right away if you:  Cough up blood.  Feel pain in your chest.  Have severe shortness of breath.  Faint or keep feeling like you are going to faint.  Have a severe headache.  Have fever or chills that get worse. These symptoms may represent a serious problem that is an emergency. Do not wait to see if the symptoms will go away. Get medical help right away. Call your local emergency services (911 in the [place] Do not drive yourself to the hospital. Summary  Acute bronchitis is sudden (acute) inflammation of the air tubes (bronchi) between the windpipe and the lungs. In adults, acute bronchitis usually goes away within 2 weeks, although coughing may last 3 weeks or longer  Take over-the-counter and prescription medicines only as told by your health care provider.  Drink enough fluid to keep your urine pale yellow.  Contact a health care provider if your symptoms do not improve after 2 weeks of treatment.  Get help right away if you cough up blood, faint, or have chest pain or shortness of breath. This information is not intended to replace advice given to you by your health care provider. Make sure you discuss any questions you have with your health care provider. Document Revised: 03/10/2019 Document Reviewed: 01/17/2019 Elsevier Patient Education  2021 [place]. Sinusitis, Adult Sinusitis is inflammation of your sinuses. Sinuses are hollow spaces in the bones around your face. Your sinuses are  located:  Around your eyes.  In the middle of your forehead.  Behind your nose.  In your cheekbones. Mucus normally drains out of your sinuses. When your nasal tissues become inflamed or swollen, mucus can become trapped or blocked. This allows bacteria, viruses, and fungi to grow, which leads to infection. Most infections of the sinuses are caused by a virus. Sinusitis can develop quickly. It can last for up to 4 weeks (acute) or for more than 12 weeks (chronic). Sinusitis often develops after a cold. What are the causes? This condition is caused by anything that creates swelling in the sinuses or stops mucus from draining. This includes:  Allergies.  Asthma.  Infection from bacteria or viruses.  Deformities or blockages in your nose or sinuses.  Abnormal growths in the nose (nasal polyps).  Pollutants, such as chemicals or irritants in the air.  Infection from fungi (rare). What increases the risk? You are more likely to develop this condition if you:  Have a weak body defense system (immune system).  Do a lot of swimming or diving.  Overuse nasal sprays.  Smoke.  What are the signs or symptoms? The main symptoms of this condition are pain and a feeling of pressure around the affected sinuses. Other symptoms include:  Stuffy nose or congestion.  Thick drainage from your nose.  Swelling and warmth over the affected sinuses.  Headache.  Upper toothache.  A cough that may get worse at night.  Extra mucus that collects in the throat or the back of the nose (postnasal drip).  Decreased sense of smell and taste.  Fatigue.  A fever.  Sore throat.  Bad breath. How is this diagnosed? This condition is diagnosed based on:  Your symptoms.  Your medical history.  A physical exam.  Tests to find out if your condition is acute or chronic. This may include: ? Checking your nose for nasal polyps. ? Viewing your sinuses using a device that has a light  (endoscope). ? Testing for allergies or bacteria. ? Imaging tests, such as an MRI or CT scan. In rare cases, a bone biopsy may be done to rule out more serious types of fungal sinus disease. How is this treated? Treatment for sinusitis depends on the cause and whether your condition is chronic or acute.  If caused by a virus, your symptoms should go away on their own within 10 days. You may be given medicines to relieve symptoms. They include: ? Medicines that shrink swollen nasal passages (topical intranasal decongestants). ? Medicines that treat allergies (antihistamines). ? A spray that eases inflammation of the nostrils (topical intranasal corticosteroids). ? Rinses that help get rid of thick mucus in your nose (nasal saline washes).  If caused by bacteria, your health care provider may recommend waiting to see if your symptoms improve. Most bacterial infections will get better without antibiotic medicine. You may be given antibiotics if you have: ? A severe infection. ? A weak immune system.  If caused by narrow nasal passages or nasal polyps, you may need to have surgery. Follow these instructions at home: Medicines  Take, use, or apply over-the-counter and prescription medicines only as told by your health care provider. These may include nasal sprays.  If you were prescribed an antibiotic medicine, take it as told by your health care provider. Do not stop taking the antibiotic even if you start to feel better. Hydrate and humidify  Drink enough fluid to keep your urine pale yellow. Staying hydrated will help to thin your mucus.  Use a cool mist humidifier to keep the humidity level in your home above 50%.  Inhale steam for 10-15 minutes, 3-4 times a day, or as told by your health care provider. You can do this in the bathroom while a hot shower is running.  Limit your exposure to cool or dry air.   Rest  Rest as much as possible.  Sleep with your head raised  (elevated).  Make sure you get enough sleep each night. General instructions  Apply a warm, moist washcloth to your face 3-4 times a day or as told by your health care provider. This will help with discomfort.  Wash your hands often with soap and water to reduce your exposure to germs. If soap and water are not available, use hand sanitizer.  Do not smoke. Avoid being around people who are smoking (secondhand smoke).  Keep all follow-up visits as told by your health care provider. This is important.   Contact a health care provider if:  You have a fever.  Your symptoms get worse.  Your symptoms do not improve within  10 days. Get help right away if:  You have a severe headache.  You have persistent vomiting.  You have severe pain or swelling around your face or eyes.  You have vision problems.  You develop confusion.  Your neck is stiff.  You have trouble breathing. Summary  Sinusitis is soreness and inflammation of your sinuses. Sinuses are hollow spaces in the bones around your face.  This condition is caused by nasal tissues that become inflamed or swollen. The swelling traps or blocks the flow of mucus. This allows bacteria, viruses, and fungi to grow, which leads to infection.  If you were prescribed an antibiotic medicine, take it as told by your health care provider. Do not stop taking the antibiotic even if you start to feel better.  Keep all follow-up visits as told by your health care provider. This is important. This information is not intended to replace advice given to you by your health care provider. Make sure you discuss any questions you have with your health care provider. Document Revised: 11/26/2017 Document Reviewed: 11/26/2017 Elsevier Patient Education  2021 [place]. How to Perform a Sinus Rinse A sinus rinse is a home treatment that is used to rinse your sinuses with a sterile mixture of salt and water (saline solution). Sinuses are  air-filled spaces in your skull behind the bones of your face and forehead that open into your nasal cavity. A sinus rinse can help to clear mucus, dirt, dust, or pollen from your nasal cavity. You may do a sinus rinse when you have a cold, a virus, nasal allergy symptoms, a sinus infection, or stuffiness in your nose or sinuses. Talk with your health care provider about whether a sinus rinse might help you. What are the risks? A sinus rinse is generally safe and effective. However, there are a few risks, which include:  A burning sensation in your sinuses. This may happen if you do not make the saline solution as directed. Be sure to follow all directions when making the saline solution.  Nasal irritation.  Infection from contaminated water. This is rare, but possible. Do not do a sinus rinse if you have had ear or nasal surgery, ear infection, or blocked ears. Supplies needed:  Saline solution or powder.  Distilled or sterile water to mix with saline powder. ? You may use boiled and cooled tap water. Boil tap water for 5 minutes; cool until it is lukewarm. Use within 24 hours. ? Do not use regular tap water to mix with the saline solution.  Neti pot or nasal rinse bottle. These supplies release the saline solution into your nose and through your sinuses. Neti pots and nasal rinse bottles can be purchased at [employer], a health food store, or online. How to perform a sinus rinse 1. Wash your hands with soap and water. 2. Wash your device according to the directions that came with the product and then dry it. 3. Use the solution that comes with your product or one that is sold separately in stores. Follow the mixing directions on the package to mix with sterile or distilled water. 4. Fill the device with the amount of saline solution noted in the device instructions. 5. Stand over a sink and tilt your head sideways over the sink. 6. Place the spout of the device in your upper  nostril (the one closer to the ceiling). 7. Gently pour or squeeze the saline solution into your nasal cavity. The liquid should drain out from  the lower nostril if you are not too congested. 8. While rinsing, breathe through your open mouth. 9. Gently blow your nose to clear any mucus and rinse solution. Blowing too hard may cause ear pain. 10. Repeat in your other nostril. 11. Clean and rinse your device with clean water and then air-dry it. Talk with your health care provider or pharmacist if you have questions about how to do a sinus rinse.   Summary  A sinus rinse is a home treatment that is used to rinse your sinuses with a sterile mixture of salt and water (saline solution).  A sinus rinse is generally safe and effective. Follow all instructions carefully.  Before doing a sinus rinse, talk with your health care provider about whether it would be helpful for you. This information is not intended to replace advice given to you by your health care provider. Make sure you discuss any questions you have with your health care provider. Document Revised: 04/06/2020 Document Reviewed: 04/06/2020 Elsevier Patient Education  2021 [employer].

## 2020-09-09 ENCOUNTER — Other Ambulatory Visit: Payer: Self-pay | Admitting: Family Medicine

## 2020-09-09 ENCOUNTER — Encounter: Payer: Self-pay | Admitting: Family Medicine

## 2020-09-09 ENCOUNTER — Ambulatory Visit: Payer: PRIVATE HEALTH INSURANCE | Admitting: Family Medicine

## 2020-09-09 ENCOUNTER — Other Ambulatory Visit: Payer: Self-pay

## 2020-09-09 VITALS — BP 137/81 | HR 87 | Temp 97.5°F | Ht 71.0 in | Wt 210.0 lb

## 2020-09-09 DIAGNOSIS — I10 Essential (primary) hypertension: Secondary | ICD-10-CM | POA: Diagnosis not present

## 2020-09-09 DIAGNOSIS — F32A Depression, unspecified: Secondary | ICD-10-CM

## 2020-09-09 DIAGNOSIS — F419 Anxiety disorder, unspecified: Secondary | ICD-10-CM

## 2020-09-09 DIAGNOSIS — E7800 Pure hypercholesterolemia, unspecified: Secondary | ICD-10-CM

## 2020-09-09 DIAGNOSIS — E1165 Type 2 diabetes mellitus with hyperglycemia: Secondary | ICD-10-CM

## 2020-09-09 MED ORDER — ESCITALOPRAM OXALATE 20 MG PO TABS: 20.0000 mg | ORAL_TABLET | Freq: Every day | ORAL | 3 refills | Status: DC

## 2020-09-09 MED ORDER — METFORMIN HCL 1000 MG PO TABS: ORAL_TABLET | ORAL | 1 refills | Status: DC

## 2020-09-09 MED ORDER — ROSUVASTATIN CALCIUM 5 MG PO TABS: 5.0000 mg | ORAL_TABLET | ORAL | 3 refills | Status: DC

## 2020-09-09 NOTE — Progress Notes (Signed)
3/3/20228:27 AM  Kelly Reed 1965-08-08, 55 y.o., female [id number]  Chief Complaint  Patient presents with  . Diabetes  . Hypertension    HPI:   Patient is a 55 y.o. female with past medical history significant for asthma, HTN, DM2, seasonal allergies, HLD,anxietywho presents todayfor routine follow-up.  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 Needs to go to chiropractor  Diabetes Metformin 6378 mg bid Trulicity 1.5 last OV Checks BG in the AM 110-120 Lab Results  Component Value Date   HGBA1C 7.9 (H) 07/07/2020   HTN Losartan 147m Last Visit added HCTZ 25 to regimen BP goal< 130/80 Doesn't monitor at home BP Readings from Last 3 Encounters:  09/09/20 137/81  08/31/20 (!) 148/93  07/16/20 (!) 130/91   HLD Crestor 564m ( 3 times a week) Lab Results  Component Value Date   CHOL 167 04/08/2020   HDL 66 04/08/2020   LDLCALC 74 04/08/2020   LDLDIRECT 109.2 01/30/2011   TRIG 158 (H) 04/08/2020   CHOLHDL 2.5 04/08/2020   Allergies/Asthma Take weekly allergy shots Singulair Cetrizine  Flonase Wixela bid Albuterol (2-3 times per month) Needed steroids last week for allergies due to dust  Depression/Anxiety Worse since end of sept PHQ-9=11 Was previously on medication for this Last on in 2019 unsure what medication Was started on Lexapro Feels like may need an increased dose Not wanting to get up and do what she needs   Colonoscopy: 01/09/20 (7 years) Mammogram in October birad 1   Health Maintenance  Topic Date Due  . OPHTHALMOLOGY EXAM  01/27/2019  . HEMOGLOBIN A1C  01/05/2021  . FOOT EXAM  07/07/2021  . PAP SMEAR-Modifier  02/15/2022  . MAMMOGRAM  05/04/2022  . COLONOSCOPY (Pts [id number] coverage will need to be confirmed)  01/09/2027  . TETANUS/TDAP  02/16/2027  . INFLUENZA VACCINE  Completed  . PNEUMOCOCCAL POLYSACCHARIDE VACCINE AGE 56-64 HIGH RISK  Completed  . COVID-19 Vaccine  Completed  . Hepatitis  C Screening  Completed  . HIV Screening  Completed  . HPV VACCINES  Aged Out     [facility] 09/09[facility]2020 08/11/2020 07/07/2020  Decreased Interest 1 0 3  Down, Depressed, Hopeless 1 0 1  PHQ - 2 Score 2 0 4  Altered sleeping 1 0 3  Tired, decreased energy 1 0 3  Change in appetite 0 0 0  Feeling bad or failure about yourself  0 0 1  Trouble concentrating 0 0 0  Moving slowly or fidgety/restless 0 0 0  Suicidal thoughts 0 0 0  PHQ-9 Score 4 0 11  Difficult doing work/chores Not difficult at all Not difficult at all Somewhat difficult  Some recent data might be hidden    Fall Risk  09/09/2020 08/11/2020 07/07/2020 04/08/2020 10/31/2019  Falls in the past year? 0 0 0 0 0  Number falls in past yr: 0 0 0 0 0  Injury with Fall? 0 0 0 0 0  Comment - - - - -  Follow up _0      Allergies  Allergen Reactions  . Adhesive [Tape] Rash and Other (See Comments)    Peeling of skin    Prior to Admission medications   Medication Sig Start Date End Date Taking? Authorizing Provider  ACCU-CHEK FASTCLIX LANCETS MISC U UTD TO CHECK BG TID 05/11/18  Yes [provider]  ACCValentine  test strip U UTD TO CHECK BG TID 04/30/18  Yes [provider]  albuterol (VENTOLIN HFA) 108 (90 Base) MCG/ACT inhaler INHALE 2 PUFFS BY MOUTH INTO THE LUNGS EVERY 6 HOURS AS NEEDED WHEEZING OR SHORTNESS OF BREATH 04/29/19  Yes Wendall Mola, NP  aspirin EC 81 MG tablet Take 1 tablet (81 mg total) by mouth daily. 08/07/14  Yes Shawnee Knapp, MD  AUVI-Q 0.3 MG/0.3ML SOAJ injection Inject into the muscle as directed. 03/05/20  Yes [provider]  Blood Glucose Monitoring Suppl (ACCU-CHEK GUIDE ME) w/Device KIT U UTD TO CHECK BG TID 05/11/18  Yes [provider]  Dulaglutide (TRULICITY) 3.71 IR/6.7EL SOPN Inject 0.75 mg into the skin once a week.  04/20/20  Yes Jacelyn Pi, Irma M, MD  Epinastine HCl 0.05 % ophthalmic solution Place 1 drop into both eyes 2 (two) times daily. 09/21/16  Yes Shawnee Knapp, MD  levocetirizine (XYZAL) 5 MG tablet Take 1 tablet by mouth every evening.  03/08/17  Yes [provider]  losartan (COZAAR) 100 MG tablet TAKE 1 TABLET BY MOUTH ONCE DAILY 05/31/20  Yes Javaun Dimperio, Laurita Quint, FNP  MAGNESIUM PO Take by mouth.   Yes [provider]  metFORMIN (GLUCOPHAGE) 1000 MG tablet TAKE 1 TABLET(1000 MG) BY MOUTH TWICE DAILY WITH A MEAL 03/22/20  Yes Jacelyn Pi, Irma M, MD  montelukast (SINGULAIR) 10 MG tablet TAKE 1 TABLET(10 MG) BY MOUTH AT BEDTIME 04/29/20  Yes Adalia Pettis, Laurita Quint, FNP  omeprazole (PRILOSEC) 20 MG capsule TAKE 1 CAPSULE BY MOUTH 2 TIMES DAILY BEFORE A MEAL. 05/31/20  Yes Dontrelle Mazon, Laurita Quint, FNP  sodium chloride (OCEAN) 0.65 % SOLN nasal spray Place 1 spray into both nostrils every 2 (two) hours while awake. 04/15/19  Yes Betancourt, Aura Fey, NP  vitamin C (ASCORBIC ACID) 500 MG tablet Take 500 mg by mouth daily.   Yes [provider]  Grant Ruts INHUB 250-50 MCG/DOSE AEPB Inhale 1 puff into the lungs 2 (two) times daily. 03/27/19  Yes [provider]  fluticasone (FLONASE) 50 MCG/ACT nasal spray Place 1 spray into both nostrils 2 (two) times daily. 04/15/19 04/22/20  Betancourt, Aura Fey, NP  rosuvastatin (CRESTOR) 5 MG tablet Take 1 tablet (5 mg total) by mouth 3 (three) times a week. 03/17/20 06/15/20  Daleen Squibb, MD    Past Medical History:  Diagnosis Date  . Allergy   . Anemia   . Anxiety   . Asthma   . COVID-19 04/29/2019  . COVID-19 virus detected 04/11/2019  . Diabetes mellitus type II   . GERD (gastroesophageal reflux disease)   . Hyperlipidemia   . Hypertension   . NSVD (normal spontaneous vaginal delivery)    x 2    Past Surgical History:  Procedure Laterality Date  . GANGLION CYST EXCISION     left foot  . 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 History of Abd. ultrasound  12/04   negative  . Surg. eval lap cholecstectomy  05/28/2006   deferred by patient  . TUBAL LIGATION  1993   bilateral    Social History   Tobacco Use  . Smoking status: Never Smoker  . Smokeless tobacco: Never Used  Substance Use Topics  . Alcohol use: Yes    Alcohol/week: 1.0 - 2.0 standard drink    Types: 1 - 2 Standard drinks or equivalent per week    Comment: Occasional beer    Family History  Problem  Relation Age of Onset  . Breast cancer Mother 82       pt's mother passed away from breast cancer when pt was 55 yo  . Diabetes Maternal Grandmother   . Colon cancer Neg Hx   . Esophageal cancer Neg Hx   . Rectal cancer Neg Hx   . Stomach cancer Neg Hx     Review of Systems  Constitutional: Negative for chills, fever and malaise/fatigue.  Eyes: Negative for blurred vision and double vision.  Respiratory: Negative for cough, shortness of breath and wheezing.   Cardiovascular: Negative for chest pain, palpitations and leg swelling.  Gastrointestinal: Negative for abdominal pain, blood in stool, constipation, diarrhea, heartburn, nausea and vomiting.  Genitourinary: Negative for dysuria, frequency and hematuria.  Musculoskeletal: Positive for back pain and joint pain (rotator cuff bilateral).  Skin: Negative for rash.  Neurological: Negative for dizziness, weakness and headaches.     OBJECTIVE:  Today's Vitals   09/09/20 0811  BP: 137/81  Pulse: 87  Temp: (!) 97.5 F (36.4 C)  SpO2: 98%  Weight: 210 lb (95.3 kg)  Height: _0  (1.803 m)   Body mass index is 29.29 kg/m.   Physical Exam Constitutional:      General: She is not in acute distress.    Appearance: Normal appearance. She is not ill-appearing.  HENT:     Head: Normocephalic.  Cardiovascular:     Rate and Rhythm: Normal rate and regular rhythm.     Pulses: Normal pulses.     Heart sounds: Normal heart sounds. No murmur heard. No friction rub. No gallop.    Pulmonary:     Effort: Pulmonary effort is normal. No respiratory distress.     Breath sounds: Normal breath sounds. No stridor. No wheezing, rhonchi or rales.  Abdominal:     General: Bowel sounds are normal.     Palpations: Abdomen is soft.     Tenderness: There is no abdominal tenderness.  Musculoskeletal:     Right shoulder: Decreased range of motion.     Left shoulder: Decreased range of motion.     Cervical back: Normal.     Thoracic back: Normal.     Lumbar back: Normal.     Right lower leg: No edema.     Left lower leg: No edema.  Skin:    General: Skin is warm and dry.  Neurological:     Mental Status: She is alert and oriented to person, place, and time.  Psychiatric:        Mood and Affect: Mood normal.        Behavior: Behavior normal.     No results found for this or any previous visit (from the past 24 hour(s)).  No results found.   ASSESSMENT and PLAN  Problem List Items Addressed This Visit      Cardiovascular and Mediastinum   Essential hypertension, benign - Primary   Relevant Medications   rosuvastatin (CRESTOR) 5 MG tablet (Start on 09/10/2020)   Other Relevant Orders   CMP14+EGFR     Endocrine   Type 2 diabetes mellitus with hyperglycemia, without long-term current use of insulin (HCC)   Relevant Medications   rosuvastatin (CRESTOR) 5 MG tablet (Start on 09/10/2020)   metFORMIN (GLUCOPHAGE) 1000 MG tablet   Other Relevant Orders   Hemoglobin A1c     Other   Anxiety and depression (Chronic)   Relevant Medications   escitalopram (LEXAPRO) 20 MG tablet   Hypercholesteremia   Relevant Medications  rosuvastatin (CRESTOR) 5 MG tablet (Start on 09/10/2020)   Other Relevant Orders   Lipid Panel            Return in about 3 months (around 12/10/2020).    Kelly Foley Arieanna Pressey, FNP-BC [facility] [place], [place] [address] Ph.  [phone] Fax [phone]

## 2020-09-09 NOTE — Patient Instructions (Addendum)
  Health Maintenance, Female Adopting a healthy lifestyle and getting preventive care are important in promoting health and wellness. Ask your health care provider about:  The right schedule for you to have regular tests and exams.  Things you can do on your own to prevent diseases and keep yourself healthy. What should I know about diet, weight, and exercise? Eat a healthy diet  Eat a diet that includes plenty of vegetables, fruits, low-fat dairy products, and lean protein.  Do not eat a lot of foods that are high in solid fats, added sugars, or sodium.   Maintain a healthy weight Body mass index (BMI) is used to identify weight problems. It estimates body fat based on height and weight. Your health care provider can help determine your BMI and help you achieve or maintain a healthy weight. Get regular exercise Get regular exercise. This is one of the most important things you can do for your health. Most adults should:  Exercise for at least 150 minutes each week. The exercise should increase your heart rate and make you sweat (moderate-intensity exercise).  Do strengthening exercises at least twice a week. This is in addition to the moderate-intensity exercise.  Spend less time sitting. Even light physical activity can be beneficial. Watch cholesterol and blood lipids Have your blood tested for lipids and cholesterol at 57 years of age, then have this test every 5 years. Have your cholesterol levels checked more often if:  Your lipid or cholesterol levels are high.  You are older than 57 years of age.  You are at high risk for heart disease. What should I know about cancer screening? Depending on your health history and family history, you may need to have cancer screening at various ages. This may include screening for:  Breast cancer.  Cervical cancer.  Colorectal cancer.  Skin cancer.  Lung cancer. What should I know about heart disease, diabetes, and high blood  pressure? Blood pressure and heart disease  High blood pressure causes heart disease and increases the risk of stroke. This is more likely to develop in people who have high blood pressure readings, are of African descent, or are overweight.  Have your blood pressure checked: ? Every 3-5 years if you are 18-39 years of age. ? Every year if you are 40 years old or older. Diabetes Have regular diabetes screenings. This checks your fasting blood sugar level. Have the screening done:  Once every three years after age 40 if you are at a normal weight and have a low risk for diabetes.  More often and at a younger age if you are overweight or have a high risk for diabetes. What should I know about preventing infection? Hepatitis B If you have a higher risk for hepatitis B, you should be screened for this virus. Talk with your health care provider to find out if you are at risk for hepatitis B infection. Hepatitis C Testing is recommended for:  Everyone born from 1945 through 1965.  Anyone with known risk factors for hepatitis C. Sexually transmitted infections (STIs)  Get screened for STIs, including gonorrhea and chlamydia, if: ? You are sexually active and are younger than 57 years of age. ? You are older than 57 years of age and your health care provider tells you that you are at risk for this type of infection. ? Your sexual activity has changed since you were last screened, and you are at increased risk for chlamydia or gonorrhea. Ask your health   care provider if you are at risk.  Ask your health care provider about whether you are at high risk for HIV. Your health care provider may recommend a prescription medicine to help prevent HIV infection. If you choose to take medicine to prevent HIV, you should first get tested for HIV. You should then be tested every 3 months for as long as you are taking the medicine. Pregnancy  If you are about to stop having your period (premenopausal) and  you may become pregnant, seek counseling before you get pregnant.  Take 400 to 800 micrograms (mcg) of folic acid every day if you become pregnant.  Ask for birth control (contraception) if you want to prevent pregnancy. Osteoporosis and menopause Osteoporosis is a disease in which the bones lose minerals and strength with aging. This can result in bone fractures. If you are 65 years old or older, or if you are at risk for osteoporosis and fractures, ask your health care provider if you should:  Be screened for bone loss.  Take a calcium or vitamin D supplement to lower your risk of fractures.  Be given hormone replacement therapy (HRT) to treat symptoms of menopause. Follow these instructions at home: Lifestyle  Do not use any products that contain nicotine or tobacco, such as cigarettes, e-cigarettes, and chewing tobacco. If you need help quitting, ask your health care provider.  Do not use street drugs.  Do not share needles.  Ask your health care provider for help if you need support or information about quitting drugs. Alcohol use  Do not drink alcohol if: ? Your health care provider tells you not to drink. ? You are pregnant, may be pregnant, or are planning to become pregnant.  If you drink alcohol: ? Limit how much you use to 0-1 drink a day. ? Limit intake if you are breastfeeding.  Be aware of how much alcohol is in your drink. In the [place] one drink equals one 12 oz bottle of beer (355 mL), one 5 oz glass of wine (148 mL), or one 1 oz glass of hard liquor (44 mL). General instructions  Schedule regular health, dental, and eye exams.  Stay current with your vaccines.  Tell your health care provider if: ? You often feel depressed. ? You have ever been abused or do not feel safe at home. Summary  Adopting a healthy lifestyle and getting preventive care are important in promoting health and wellness.  Follow your health care provider's instructions about healthy  diet, exercising, and getting tested or screened for diseases.  Follow your health care provider's instructions on monitoring your cholesterol and blood pressure. This information is not intended to replace advice given to you by your health care provider. Make sure you discuss any questions you have with your health care provider. Document Revised: 06/19/2018 Document Reviewed: 06/19/2018 Elsevier Patient Education  2021 Elsevier Inc.   If you have lab work done today you will be contacted with your lab results within the next 2 weeks.  If you have not heard from us then please contact us. The fastest way to get your results is to register for My Chart.   IF you received an x-ray today, you will receive an invoice from [facility]. Please contact [facility] at [phone] with questions or concerns regarding your invoice.   IF you received labwork today, you will receive an invoice from [facility]. Please contact [facility] at [phone] with questions or concerns regarding your invoice.   Our billing   staff will not be able to assist you with questions regarding bills from these companies.  You will be contacted with the lab results as soon as they are available. The fastest way to get your results is to activate your My Chart account. Instructions are located on the last page of this paperwork. If you have not heard from us regarding the results in 2 weeks, please contact this office.      

## 2020-09-09 NOTE — Telephone Encounter (Signed)
Pt's meds sent to [facility] on [address].  The pharmacy has been closed 3 days (system is down).  The [facility] on [address] cannot access her scripts because they are closed. Pt needs metFORMIN (GLUCOPHAGE) 1000 MG tablet rosuvastatin (CRESTOR) 5 MG tablet  escitalopram (LEXAPRO) 20 MG tablet  Sent to [place], [place] [facility] [address]

## 2020-09-10 ENCOUNTER — Telehealth: Payer: Self-pay | Admitting: Registered Nurse

## 2020-09-10 ENCOUNTER — Other Ambulatory Visit: Payer: Self-pay | Admitting: Family Medicine

## 2020-09-10 ENCOUNTER — Encounter: Payer: Self-pay | Admitting: Registered Nurse

## 2020-09-10 LAB — CMP14+EGFR
ALT: 49 IU/L — ABNORMAL HIGH (ref 0–32)
AST: 34 IU/L (ref 0–40)
Albumin/Globulin Ratio: 1.6 (ref 1.2–2.2)
Albumin: 4.3 g/dL (ref 3.8–4.9)
Alkaline Phosphatase: 74 IU/L (ref 44–121)
BUN/Creatinine Ratio: 18 (ref 9–23)
BUN: 12 mg/dL (ref 6–24)
Bilirubin Total: 0.4 mg/dL (ref 0.0–1.2)
CO2: 22 mmol/L (ref 20–29)
Calcium: 9.4 mg/dL (ref 8.7–10.2)
Chloride: 100 mmol/L (ref 96–106)
Creatinine, Ser: 0.67 mg/dL (ref 0.57–1.00)
Globulin, Total: 2.7 g/dL (ref 1.5–4.5)
Glucose: 118 mg/dL — ABNORMAL HIGH (ref 65–99)
Potassium: 4.2 mmol/L (ref 3.5–5.2)
Sodium: 139 mmol/L (ref 134–144)
Total Protein: 7 g/dL (ref 6.0–8.5)
eGFR: 103 mL/min/{1.73_m2} (ref >59)

## 2020-09-10 LAB — HEMOGLOBIN A1C
Est. average glucose Bld gHb Est-mCnc: 151 mg/dL
Hgb A1c MFr Bld: 6.9 % — ABNORMAL HIGH (ref 4.8–5.6)

## 2020-09-10 LAB — LIPID PANEL
Chol/HDL Ratio: 4 ratio (ref 0.0–4.4)
Cholesterol, Total: 240 mg/dL — ABNORMAL HIGH (ref 100–199)
HDL: 60 mg/dL (ref >39)
LDL Chol Calc (NIH): 154 mg/dL — ABNORMAL HIGH (ref 0–99)
Triglycerides: 147 mg/dL (ref 0–149)
VLDL Cholesterol Cal: 26 mg/dL (ref 5–40)

## 2020-09-10 NOTE — Telephone Encounter (Signed)
Reviewed Epic seen at [facility] office yesterday VSS afebrile.  Left message for patient to call me back at [phone]

## 2020-09-10 NOTE — Telephone Encounter (Signed)
Patient returned call stated woke up 0300 with fever 100.57F and scratchy throat then went back to bed.  Has had all 3 covid vaccines (up to date).  Denied sick contacts, n/v/d/body aches/cough.  Has been having post nasal drip due to spring allergies was outside a lot yesterday as pharmacy had issues filling her Rx due to computer problems and he had to go to another location and pay cash for her medicine and trying today to still get Rx refills her PCM sent in for her.  PCM [place] closing location end of March and she also was notified yesterday she has to find new PCM.  Discussed with patient symptoms could be flu, covid or something else.  Patient denied ear/sinus pain/productive cough.  Has dayquil and nyquil at home to use for post nasal drip along with her regular regimen allergic rhinitis.  Discussed increase use of nasal saline frequency and if possible stay inside to see if symptoms improve with less pollen exposure.  Will follow up with patient this weekend to see if fever breaks.  Cannot return to work until 24 hours fever free of antipyretics.  Covid and flu testing scheduled for Sunday 3/6 at [place] per patient preference.  Encouraged patient to quarantine/wear mask when around others.  [facility] sent [facility], swab instructions and appt confirmation Lella, you're all signed up We'll be ready with tests for COVID-19 and the flu. Since you'll need this info for your test, we've emailed it to you as well. My appointment For travel, be sure your destination accepts this test.  Test type: Molecular lab test (PCR / NAAT)  Required for most travel  Drive-thru test (vehicle recommended)  Results within 1-2 days1  Sun. 03/6 11:00 AMAdd to calendar [address], [place], [place], [place] [address]  Get directions When you arrive   [facility] the [facility]---we'll test you in your vehicle   Please wear a face mask  Confirmation  code [phone]  Schedule another person Cancel my visit Things to know before you go   What to bring Health insurance card (if you're using insurance) Proof of identity Multimedia programmer, driver's license, or state ID) Confirmation code from text or email Mobile phone A face mask   Getting your results We'll text a secure link to your results-which you can access for 5 days. For longer access, you can save them to your phone or access them on your health dashboard. Right now, we cannot create an account for minors, so save down those results! View our Engineer, mining (PDF) Footnotes High demand at the labs can lead to delays in turnaround times.  Patient to contact me if PCM unable to send her rx to another pharmacy today as she will run out today.  Patient voice scratchy/raspy.  Respirations even and unlabored no cough or throat clearing noted during telephone call.  Some nasal congestion  Patient A&Ox3.  Patient verbalized understanding information/instructions, agreed with plan of care and had no further questions at this time.  Duration of call 22 minutes.  HR Replacements team notified patient scheduled for testing and Day 0 09/10/20  Day 3 testing  Day 5 3/9  Day 10 3/14

## 2020-09-14 NOTE — Telephone Encounter (Signed)
Telephone message left for patient checking in to see if covid test results received.

## 2020-09-14 NOTE — Telephone Encounter (Signed)
Late entry patient contacted me on 6 and 7 Mar to notify me that testing site computer down and unable to complete [facility].  Patient sent confirmation 3/07 at [address] [place] confirmation code [phone] to her preferred email on 6 Mar.  On 7 Mar  Patient was able to reach [employer] and she was [facility]cheduled for [facility] since [facility] having Psychologist, forensic.  Patient to notify us when she receives covid test results.  Patient denied fever/chills/n/v/d/sore throat/cough/myalgias.  Day 5 testing now.  HR notified testing delayed due to site computer issue.  Patient verbalized understanding information/instructions, agreed with plan of care and had no further questions at this time.

## 2020-09-15 NOTE — Telephone Encounter (Signed)
Patient contacted via telephone covid test results received this am negative by patient.  Patient denied symptoms and feels well enough to work.  Patient cleared to return to work today.  To contact supervisor if she is to come in mid shift today.  I will notify HR patient cleared to return onsite.  Patient verbalized understanding information/instructions, agreed with plan of care and had no further questions at this time.  Patient A&Ox3 respirations even and unlabored no cough/nasal sniffing/throat clearing or nasal congestion noted/audible during telephone conversation.

## 2020-09-16 NOTE — Telephone Encounter (Signed)
Noted patient at work today and no concerns reviewed [employer] note.

## 2020-09-16 NOTE — Telephone Encounter (Signed)
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 Feels well. Closing encounter.

## 2020-09-21 NOTE — Telephone Encounter (Signed)
Patient seen in workcenter feeling well and no concerns.  Gait sure and steady respirations even and unlabored skin warm dry and pink no cough/congestion or throat clearing noted.  Bilateral allergic shiners.  Spoke full sentences without difficulty.

## 2020-09-23 ENCOUNTER — Other Ambulatory Visit: Payer: Self-pay | Admitting: Family Medicine

## 2020-09-23 DIAGNOSIS — K219 Gastro-esophageal reflux disease without esophagitis: Secondary | ICD-10-CM

## 2020-10-08 ENCOUNTER — Other Ambulatory Visit: Payer: Self-pay | Admitting: *Deleted

## 2020-10-08 NOTE — Telephone Encounter (Signed)
Pt was requesting Flonase refill but then realized while on phone that she had some at home already and declined new Rx. Encounter cancelled/closed.

## 2020-10-28 ENCOUNTER — Ambulatory Visit: Payer: Self-pay | Admitting: Registered Nurse

## 2020-10-28 ENCOUNTER — Encounter: Payer: Self-pay | Admitting: Registered Nurse

## 2020-10-28 ENCOUNTER — Other Ambulatory Visit: Payer: Self-pay

## 2020-10-28 VITALS — BP 147/94 | HR 95 | Temp 99.5°F | Resp 16

## 2020-10-28 DIAGNOSIS — L309 Dermatitis, unspecified: Secondary | ICD-10-CM

## 2020-10-28 DIAGNOSIS — R030 Elevated blood-pressure reading, without diagnosis of hypertension: Secondary | ICD-10-CM

## 2020-10-28 MED ORDER — DIPHENHYDRAMINE HCL 2 % EX GEL: 1.0000 "application " | Freq: Two times a day (BID) | CUTANEOUS | Status: AC | PRN

## 2020-10-28 MED ORDER — AQUAPHOR EX OINT: TOPICAL_OINTMENT | Freq: Two times a day (BID) | CUTANEOUS | 0 refills | Status: AC | PRN

## 2020-10-28 NOTE — Patient Instructions (Signed)
Contact Dermatitis Dermatitis is redness, soreness, and swelling (inflammation) of the skin. Contact dermatitis is a reaction to certain substances that touch the skin. Many different substances can cause contact dermatitis. There are two types of contact dermatitis:  Irritant contact dermatitis. This type is caused by something that irritates your skin, such as having dry hands from washing them too often with soap. This type does not require previous exposure to the substance for a reaction to occur. This is the most common type.  Allergic contact dermatitis. This type is caused by a substance that you are allergic to, such as poison ivy. This type occurs when you have been exposed to the substance (allergen) and develop a sensitivity to it. Dermatitis may develop soon after your first exposure to the allergen, or it may not develop until the next time you are exposed and every time thereafter. What are the causes? Irritant contact dermatitis is most commonly caused by exposure to:  Makeup.  Soaps.  Detergents.  Bleaches.  Acids.  Metal salts, such as nickel. Allergic contact dermatitis is most commonly caused by exposure to:  Poisonous plants.  Chemicals.  Jewelry.  Latex.  Medicines.  Preservatives in products, such as clothing. What increases the risk? You are more likely to develop this condition if you have:  A job that exposes you to irritants or allergens.  Certain medical conditions, such as asthma or eczema. What are the signs or symptoms? Symptoms of this condition may occur on your body anywhere the irritant has touched you or is touched by you.  Symptoms include: ? Dryness or flaking. ? Redness. ? Cracks. ? Itching. ? Pain or a burning feeling. ? Blisters. ? Drainage of small amounts of blood or clear fluid from skin cracks. With allergic contact dermatitis, there may also be swelling in areas such as the eyelids, mouth, or genitals.   How is this  diagnosed? This condition is diagnosed with a medical history and physical exam.  A patch skin test may be performed to help determine the cause.  If the condition is related to your job, you may need to see an occupational medicine specialist. How is this treated? This condition is treated by checking for the cause of the reaction and protecting your skin from further contact. Treatment may also include:  Steroid creams or ointments. Oral steroid medicines may be needed in more severe cases.  Antibiotic medicines or antibacterial ointments, if a skin infection is present.  Antihistamine lotion or an antihistamine taken by mouth to ease itching.  A bandage (dressing). Follow these instructions at home: Skin care  Moisturize your skin as needed.  Apply cool compresses to the affected areas.  Try applying baking soda paste to your skin. Stir water into baking soda until it reaches a paste-like consistency.  Do not scratch your skin, and avoid friction to the affected area.  Avoid the use of soaps, perfumes, and dyes. Medicines  Take or apply over-the-counter and prescription medicines only as told by your health care provider.  If you were prescribed an antibiotic medicine, take or apply the antibiotic as told by your health care provider. Do not stop using the antibiotic even if your condition improves. Bathing  Try taking a bath with: ? Epsom salts. Follow the instructions on the packaging. You can get these at your local pharmacy or grocery store. ? Baking soda. Pour a small amount into the bath as directed by your health care provider. ? Colloidal oatmeal. Follow the instructions   on the packaging. You can get this at your local pharmacy or grocery store.  Bathe less frequently, such as every other day.  Bathe in lukewarm water. Avoid using hot water. Bandage care  If you were given a bandage (dressing), change it as told by your health care provider.  Wash your hands  with soap and water before and after you change your dressing. If soap and water are not available, use hand sanitizer. General instructions  Avoid the substance that caused your reaction. If you do not know what caused it, keep a journal to try to track what caused it. Write down: ? What you eat. ? What cosmetic products you use. ? What you drink. ? What you wear in the affected area. This includes jewelry.  Check the affected areas every day for signs of infection. Check for: ? More redness, swelling, or pain. ? More fluid or blood. ? Warmth. ? Pus or a bad smell.  Keep all follow-up visits as told by your health care provider. This is important. Contact a health care provider if:  Your condition does not improve with treatment.  Your condition gets worse.  You have signs of infection such as swelling, tenderness, redness, soreness, or warmth in the affected area.  You have a fever.  You have new symptoms. Get help right away if:  You have a severe headache, neck pain, or neck stiffness.  You vomit.  You feel very sleepy.  You notice red streaks coming from the affected area.  Your bone or joint underneath the affected area becomes painful after the skin has healed.  The affected area turns darker.  You have difficulty breathing. Summary  Dermatitis is redness, soreness, and swelling (inflammation) of the skin. Contact dermatitis is a reaction to certain substances that touch the skin.  Symptoms of this condition may occur on your body anywhere the irritant has touched you or is touched by you.  This condition is treated by figuring out what caused the reaction and protecting your skin from further contact. Treatment may also include medicines and skin care.  Avoid the substance that caused your reaction. If you do not know what caused it, keep a journal to try to track what caused it.  Contact a health care provider if your condition gets worse or you have signs  of infection such as swelling, tenderness, redness, soreness, or warmth in the affected area. This information is not intended to replace advice given to you by your health care provider. Make sure you discuss any questions you have with your health care provider. Document Revised: 10/16/2018 Document Reviewed: 01/09/2018 Elsevier Patient Education  2021 Elsevier Inc.  

## 2020-10-28 NOTE — Progress Notes (Signed)
Subjective:    Patient ID: Kelly Reed, female    DOB: 1965/11/19, 55 y.o.   MRN: [id number]  54y/o Caucasian established female pt c/o rash to upper lip (lateral subunit) and just under R nares/nasal fold x1 week.  Has been applying neosporin without any improvement.  Denied itching/blisters/purulent discharge/licking lips frequently/rubbing from mask as no longer wearing mask at work or when out in community.  Seasonal allergies have been flaring and still getting immunotherapy serum recently changed.     Review of Systems  Constitutional: Negative for activity change, appetite change, chills, diaphoresis, fatigue and fever.  HENT: Positive for congestion, postnasal drip and rhinorrhea. Negative for trouble swallowing and voice change.   Eyes: Negative for photophobia and visual disturbance.  Respiratory: Negative for cough, shortness of breath, wheezing and stridor.   Cardiovascular: Negative for chest pain.  Gastrointestinal: Negative for diarrhea, nausea and vomiting.  Endocrine: Negative for cold intolerance and heat intolerance.  Genitourinary: Negative for difficulty urinating.  Musculoskeletal: Negative for gait problem, myalgias, neck pain and neck stiffness.  Skin: Positive for color change and rash. Negative for pallor and wound.  Allergic/Immunologic: Positive for environmental allergies and immunocompromised state. Negative for food allergies.  Neurological: Negative for dizziness, tremors, seizures, syncope, facial asymmetry, speech difficulty, weakness, light-headedness, numbness and headaches.  Hematological: Negative for adenopathy. Does not bruise/bleed easily.  Psychiatric/Behavioral: Negative for agitation, confusion and sleep disturbance.       Objective:   Physical Exam Vitals and nursing note reviewed.  Constitutional:      General: She is awake. She is not in acute distress.    Appearance: Normal appearance. She is well-developed, well-groomed and  overweight. She is not ill-appearing, toxic-appearing or diaphoretic.  HENT:     Head: Normocephalic and atraumatic.     Jaw: There is normal jaw occlusion.     Salivary Glands: Right salivary gland is not diffusely enlarged or tender. Left salivary gland is not diffusely enlarged or tender.     Right Ear: Hearing and external ear normal.     Left Ear: Hearing and external ear normal.     Nose: Nose normal.     Mouth/Throat:     Lips: Pink. No lesions.     Mouth: Mucous membranes are moist. No lacerations, oral lesions or angioedema.     Dentition: No dental caries, dental abscesses or gum lesions.     Tongue: No lesions. Tongue does not deviate from midline.     Palate: No mass and lesions.     Pharynx: Oropharynx is clear. Uvula midline.   Eyes:     General: Lids are normal. Vision grossly intact. Gaze aligned appropriately. Allergic shiner present. No visual field deficit or scleral icterus.       Right eye: No discharge.        Left eye: No discharge.     Extraocular Movements: Extraocular movements intact.     Conjunctiva/sclera: Conjunctivae normal.     Pupils: Pupils are equal, round, and reactive to light.  Neck:     Trachea: Trachea and phonation normal. No tracheal deviation.  Cardiovascular:     Rate and Rhythm: Normal rate and regular rhythm.     Pulses: Normal pulses.          Radial pulses are 2+ on the right side and 2+ on the left side.     Heart sounds: Normal heart sounds.  Pulmonary:     Effort: Pulmonary effort is normal. No respiratory distress.  Breath sounds: Normal breath sounds and air entry. No stridor or transmitted upper airway sounds. No wheezing.     Comments: Spoke full sentences without difficulty; no cough/nasal sniffing or throat clearing noted while in clinic Abdominal:     General: Abdomen is flat.     Palpations: Abdomen is soft.  Musculoskeletal:        General: No swelling, deformity or signs of injury. Normal range of motion.     Right  shoulder: No swelling, deformity, effusion or laceration.     Left shoulder: No swelling, deformity, effusion or laceration.     Right elbow: No swelling, deformity, effusion or lacerations. Normal range of motion.     Left elbow: No swelling, deformity, effusion or lacerations. Normal range of motion.     Right hand: No swelling, deformity or lacerations. Normal range of motion.     Left hand: No swelling, deformity or lacerations. Normal range of motion.     Cervical back: Normal range of motion and neck supple. No swelling, edema, deformity, erythema, signs of trauma, lacerations, rigidity, torticollis or crepitus. No pain with movement. Normal range of motion.  Lymphadenopathy:     Head:     Right side of head: No submental, submandibular or preauricular adenopathy.     Left side of head: No submental, submandibular or preauricular adenopathy.     Cervical: No cervical adenopathy.     Right cervical: No superficial cervical adenopathy.    Left cervical: No superficial cervical adenopathy.  Skin:    General: Skin is warm and dry.     Capillary Refill: Capillary refill takes less than 2 seconds.     Coloration: Skin is not ashen, cyanotic, jaundiced, mottled, pale or sallow.     Findings: Erythema and rash present. No abrasion, abscess, acne, bruising, burn, ecchymosis, signs of injury, laceration, lesion, petechiae or wound. Rash is macular and papular. Rash is not crusting, nodular, purpuric, pustular, scaling, urticarial or vesicular.     Nails: There is no clubbing.          Comments: See HEENT  Neurological:     General: No focal deficit present.     Mental Status: She is alert and oriented to person, place, and time. Mental status is at baseline.     GCS: GCS eye subscore is 4. GCS verbal subscore is 5. GCS motor subscore is 6.     Cranial Nerves: Cranial nerves are intact. No cranial nerve deficit, dysarthria or facial asymmetry.     Sensory: Sensation is intact. No sensory  deficit.     Motor: Motor function is intact. No weakness, tremor, atrophy, abnormal muscle tone or seizure activity.     Coordination: Coordination is intact. Coordination normal.     Gait: Gait is intact. Gait normal.     Comments: Bilateral hand grasp equal 5/5; in/out of chair without difficulty; gait sure and steady in clinic  Psychiatric:        Attention and Perception: Attention and perception normal.        Mood and Affect: Mood and affect normal.        Speech: Speech normal.        Behavior: Behavior normal. Behavior is cooperative.        Thought Content: Thought content normal.        Cognition and Memory: Cognition and memory normal.        Judgment: Judgment normal.           Assessment &  Plan:  A-facial dermatitis, elevated blood pressure reading  P-suspect contact dermatitis DDx allergic.  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  On oral antihistamine daily for allergic rhinitis.  calagel thin smear BID prn itching/rash given 2 UD from clinic stock; do not get in eyes; if worsening with calagel use stop and just apply aquaphor after shower bedtime and prn dry skin/irritation.  Avoid foundation over affected area.  consider hydrocortisone 1% topical BID small smear affected area if worsening with aquaphor and calagel.  Stop neosporin as no improvement.  Wash hands before and after application.  Avoid hot steam showers.  Apply emollient twice a day e.g. Fragrance free vaseline.  May apply ice/cold compress 5 minutes QID prn itching/swelling. Could have been exposed to cleaners/chemicals/allergens/irirtants that transferred from items to hands then her body/clothing.  Shower when she finishes work/prior to bedtime.  Avoid harsh/abrasive soaps use fragrance free/sensitive like dove/cetaphil.    Medication as directed. Call or return to clinic as needed if these symptoms worsen or fail to improve as anticipated. Exitcare handout on contact  dermatitis  Follow up for re-evaluation in 48 hours if no improvement and/or worsening of rash with plan of care. 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  Is established with [facility] also and has regular follow up appts.

## 2020-11-19 ENCOUNTER — Telehealth: Payer: Self-pay | Admitting: Family Medicine

## 2020-11-19 NOTE — Telephone Encounter (Signed)
Patient has called in regards to her FMLA paperwork. She usually gets them done at her previous office but the provider is no longer practicing.  Thr paperwork runs out Jun 6th, 2022 but she wants to know if she could be seen sooner, or if the paperwork could be done based off previous encounter.  If not, should she ask her allergy specialist in the mean time.  Please advise [phone]

## 2020-11-22 NOTE — Telephone Encounter (Signed)
Left message in voice mail concerning FMLA paperwork she usally have completed at former provider. Patient has appointment with Dr Sharlet Salina as new patient.

## 2020-11-30 ENCOUNTER — Ambulatory Visit: Payer: Self-pay | Admitting: Registered Nurse

## 2020-11-30 ENCOUNTER — Other Ambulatory Visit: Payer: Self-pay | Admitting: Registered Nurse

## 2020-11-30 VITALS — BP 125/102 | HR 92 | Temp 98.9°F

## 2020-11-30 DIAGNOSIS — R030 Elevated blood-pressure reading, without diagnosis of hypertension: Secondary | ICD-10-CM

## 2020-11-30 DIAGNOSIS — J0101 Acute recurrent maxillary sinusitis: Secondary | ICD-10-CM

## 2020-11-30 DIAGNOSIS — H6502 Acute serous otitis media, left ear: Secondary | ICD-10-CM

## 2020-11-30 DIAGNOSIS — H6983 Other specified disorders of Eustachian tube, bilateral: Secondary | ICD-10-CM

## 2020-11-30 MED ORDER — AZITHROMYCIN 250 MG PO TABS: ORAL_TABLET | ORAL | 0 refills | Status: DC

## 2020-11-30 MED ORDER — SALINE SPRAY 0.65 % NA SOLN: 1.0000 | NASAL | Status: DC

## 2020-11-30 MED ORDER — FLUTICASONE PROPIONATE 50 MCG/ACT NA SUSP: 1.0000 | Freq: Two times a day (BID) | NASAL | 6 refills | Status: DC

## 2020-11-30 NOTE — Progress Notes (Signed)
Subjective:    Patient ID: Kelly Reed, female    DOB: 1966-06-13, 55 y.o.   MRN: [id number]  54y/o caucasian female established patient here today for evaluation of ear pain left 5/10, intermittent popping in ears.  Worse at work then when at home.  Reported was on prednisone taper last week for asthma exacerbation and also had injected steroids from [facility].  Her inhalers were also changed and seem to be working better now but are not covered by insurance also.  Denied ear discharge/fever/chills/dyspnea/wheezing.  Still having occasional post nasal drip.     Review of Systems  Constitutional: Negative for activity change, appetite change, chills, diaphoresis, fatigue and fever.  HENT: Positive for ear pain, hearing loss and postnasal drip. Negative for congestion, ear discharge, facial swelling, mouth sores, nosebleeds, rhinorrhea, sinus pressure, sinus pain, sneezing, trouble swallowing and voice change.   Eyes: Negative for photophobia, pain, discharge, redness, itching and visual disturbance.  Respiratory: Negative for cough, chest tightness, shortness of breath, wheezing and stridor.   Cardiovascular: Negative for chest pain.  Gastrointestinal: Negative for diarrhea, nausea and vomiting.  Endocrine: Negative for cold intolerance and heat intolerance.  Genitourinary: Negative for difficulty urinating.  Musculoskeletal: Negative for gait problem, neck pain and neck stiffness.  Skin: Negative for color change, rash and wound.  Allergic/Immunologic: Positive for environmental allergies and immunocompromised state. Negative for food allergies.  Neurological: Negative for dizziness, tremors, seizures, syncope, facial asymmetry, speech difficulty, weakness, light-headedness, numbness and headaches.  Hematological: Negative for adenopathy. Does not bruise/bleed easily.  Psychiatric/Behavioral: Negative for agitation, confusion and sleep disturbance.       Objective:   Physical Exam Vitals  and nursing note reviewed.  Constitutional:      General: She is awake. She is not in acute distress.    Appearance: Normal appearance. She is well-developed, well-groomed and overweight. She is not ill-appearing, toxic-appearing or diaphoretic.  HENT:     Head: Normocephalic and atraumatic.     Jaw: There is normal jaw occlusion. No trismus.     Salivary Glands: Right salivary gland is not diffusely enlarged or tender. Left salivary gland is not diffusely enlarged or tender.     Right Ear: Hearing, ear canal and external ear normal. A middle ear effusion is present.     Left Ear: Hearing, ear canal and external ear normal. A middle ear effusion is present. Tympanic membrane is erythematous and bulging. Tympanic membrane is not injected.     Ears:     Comments: Left auditory canal slightly erythematous dry along with air fluid level clear bulging TM left and erythema 2-4 oclock    Nose: Mucosal edema and rhinorrhea present. No nasal deformity, septal deviation, laceration, nasal tenderness or congestion. Rhinorrhea is clear.     Right Turbinates: Enlarged and swollen. Not pale.     Left Turbinates: Enlarged and swollen. Not pale.     Right Sinus: Maxillary sinus tenderness and frontal sinus tenderness present.     Left Sinus: Maxillary sinus tenderness and frontal sinus tenderness present.     Comments: Nasal turbinates edema erythema clear discharge; bilateral allergic shiners; cobblestoning posterior pharynx; bilateral maxillary and frontal sinuses equally TTP    Mouth/Throat:     Lips: Pink. No lesions.     Mouth: Mucous membranes are moist. Mucous membranes are not pale, not dry and not cyanotic. No lacerations, oral lesions or angioedema.     Dentition: No dental caries, dental abscesses or gum lesions.  Tongue: No lesions. Tongue does not deviate from midline.     Palate: No mass and lesions.     Pharynx: Uvula midline. Pharyngeal swelling and posterior oropharyngeal erythema  present. No oropharyngeal exudate or uvula swelling.     Tonsils: No tonsillar exudate or tonsillar abscesses. 0 on the right. 0 on the left.  Eyes:     General: Lids are normal. Vision grossly intact. Gaze aligned appropriately. Allergic shiner present. No scleral icterus.       Right eye: No foreign body, discharge or hordeolum.        Left eye: No foreign body, discharge or hordeolum.     Extraocular Movements:     Right eye: Normal extraocular motion and no nystagmus.     Left eye: Normal extraocular motion and no nystagmus.     Conjunctiva/sclera: Conjunctivae normal.     Right eye: Right conjunctiva is not injected. No chemosis, exudate or hemorrhage.    Left eye: Left conjunctiva is not injected. No chemosis, exudate or hemorrhage.    Pupils: Pupils are equal, round, and reactive to light. Pupils are equal.     Right eye: Pupil is round and reactive.     Left eye: Pupil is round and reactive.  Neck:     Thyroid: No thyroid tenderness.     Trachea: Trachea and phonation normal. No tracheal tenderness or tracheal deviation.  Cardiovascular:     Rate and Rhythm: Normal rate and regular rhythm.     Pulses:          Radial pulses are 2+ on the right side and 2+ on the left side.     Heart sounds: Normal heart sounds, S1 normal and S2 normal. No murmur heard. No friction rub. No gallop.   Pulmonary:     Effort: Pulmonary effort is normal. No accessory muscle usage or respiratory distress.     Breath sounds: Normal breath sounds and air entry. No stridor or transmitted upper airway sounds. No decreased breath sounds, wheezing, rhonchi or rales.     Comments: Spoke full sentences without difficulty; no cough/nasal sniffing or throat clearing noted in clinic Chest:     Chest wall: No tenderness.  Abdominal:     General: There is no distension.     Palpations: Abdomen is soft.  Musculoskeletal:        General: No tenderness. Normal range of motion.     Right shoulder: Normal.      Left shoulder: Normal.     Right elbow: No swelling, deformity, effusion or lacerations. Normal range of motion.     Left elbow: No swelling, deformity, effusion or lacerations. Normal range of motion.     Right hand: Normal.     Left hand: Normal.     Cervical back: Normal range of motion and neck supple. No swelling, edema, deformity, erythema, signs of trauma, lacerations, rigidity, torticollis or crepitus. No muscular tenderness. Normal range of motion.     Thoracic back: No swelling, edema, deformity, signs of trauma or lacerations. Normal range of motion.     Lumbar back: No swelling, edema, deformity, signs of trauma or lacerations. Normal range of motion.     Right hip: Normal.     Left hip: Normal.     Right knee: Normal.     Left knee: Normal.  Lymphadenopathy:     Head:     Right side of head: No submental, submandibular, tonsillar, preauricular, posterior auricular or occipital adenopathy.  Left side of head: No submental, submandibular, tonsillar, preauricular, posterior auricular or occipital adenopathy.     Cervical: No cervical adenopathy.     Right cervical: No superficial, deep or posterior cervical adenopathy.    Left cervical: No superficial, deep or posterior cervical adenopathy.  Skin:    General: Skin is warm and dry.     Capillary Refill: Capillary refill takes less than 2 seconds.     Coloration: Skin is not ashen, cyanotic, jaundiced, mottled, pale or sallow.     Findings: No abrasion, abscess, acne, bruising, burn, ecchymosis, erythema, signs of injury, laceration, lesion, petechiae, rash or wound.     Nails: There is no clubbing.  Neurological:     General: No focal deficit present.     Mental Status: She is alert and oriented to person, place, and time. Mental status is at baseline. She is not disoriented.     GCS: GCS eye subscore is 4. GCS verbal subscore is 5. GCS motor subscore is 6.     Cranial Nerves: Cranial nerves are intact. No cranial nerve  deficit, dysarthria or facial asymmetry.     Sensory: Sensation is intact. No sensory deficit.     Motor: Motor function is intact. No weakness, tremor, atrophy, abnormal muscle tone or seizure activity.     Coordination: Coordination is intact. Coordination normal.     Gait: Gait is intact. Gait normal.     Comments: Gait sure and steady in clinic; on/off exam table without difficulty; bilateral hand grasp equal 5/5  Psychiatric:        Attention and Perception: Attention and perception normal.        Mood and Affect: Mood and affect normal.        Speech: Speech normal.        Behavior: Behavior normal. Behavior is cooperative.        Thought Content: Thought content normal.        Cognition and Memory: Cognition and memory normal.        Judgment: Judgment normal.           Assessment & Plan:  A-acute left otitis media, eustachian tube dysfunction, recurrent maxillary sinusitis  P-Supportive treatment. Azithromycin 250mg  sig t2 po day 1 then t1 po daily day 2-5 #6 RF0 dispensed from [facility] to patient Separate administration of magnesium and azithromycin by 2 hours per epocrates.  Hold azithromycin and start if symptoms worsening despite taking tylenol and her allergy medications every day especially if fever/ear discharge/worsening pain.   Tylenol 1000mg  po QID prn pain/fever.   No evidence of invasive bacterial infection, non toxic and well hydrated.  This is most likely self limiting viral infection.  I do not see where any further testing or imaging is necessary at this time.   I will suggest supportive care, rest, good hygiene and encourage the patient to take adequate fluids.  The patient is to return to clinic or EMERGENCY ROOM if symptoms worsen or change significantly e.g. ear pain, fever, purulent discharge from ears or bleeding.  Exitcare handout on otitis media printed and given to patient.   Patient verbalized agreement and understanding of treatment plan and had no further  questions at this time.   No evidence of invasive bacterial infection, non toxic and well hydrated.  I do not see where any further testing or imaging is necessary at this time.   I will suggest supportive care, rest, good hygiene and encourage the patient to take adequate fluids.  The patient is to return to clinic or EMERGENCY ROOM if symptoms worsen or change significantly e.g. ear pain, fever, purulent discharge from ears or bleeding.  Exitcare handout on otitis media and eustachian tube dysfunction printed and given to patient.  Discussed with patient post nasal drip irritates throat/causes swelling blocks eustachian tubes from draining and fluid fills up middle ear.  Bacteria/viruses can grow in fluid and with moving head tube compressed and increases pressure in tube/ear worsening pain.  Studies show will take 30 days for fluid to resolve after post nasal drip controlled with nasal steroid/antihistamine. Antibiotics and steroids do not speed up fluid removal.  Patient verbalized agreement and understanding of treatment plan and had no further questions at this time.  Continue xyzal 5mg  po daily.  flonase 1 spray each nostril BID, saline 2 sprays each nostril q2h wa prn congestion.  Patient has finished round of IM and oral steroids from [facility] and sinusitis symptoms improving.   Denied personal or family history of ENT cancer.  Shower BID especially prior to bed. No evidence of systemic bacterial infection, non toxic and well hydrated.  I do not see where any further testing or imaging is necessary at this time.   I will suggest supportive care, rest, good hygiene and encourage the patient to take adequate fluids.  The patient is to return to clinic or EMERGENCY ROOM if symptoms worsen or change significantly.  Exitcare handout on sinusitis and sinus rinse printed and given to patient.  Patient verbalized agreement and understanding of treatment plan and had no further questions at this time.   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  Will continue to monitor.  If headache/chest pain/visual changes patient to have same day re-evaluation.  Has tylenol OTC for ear pain.  Patient verbalized understanding information/instructions, agreed with plan of care and had no further questions at this time.

## 2020-11-30 NOTE — Patient Instructions (Signed)
Otitis Media, Adult  Otitis media occurs when there is inflammation and fluid in the middle ear space with signs and symptoms of an acute infection. The middle ear is a part of the ear that contains bones for hearing as well as air that helps send sounds to the brain. When infected fluid builds up in this space, it causes pressure and results in symptoms of acute otitis media. The eustachian tube connects the middle ear to the back of the nose (nasopharynx) and normally allows air into the middle ear space. If the eustachian tube becomes blocked, fluid can build up and become infected. What are the causes? This condition is caused by a blockage in the eustachian tube. This can be caused by an object like mucus, or by swelling (edema) of the tube. Problems that can cause a blockage include:  A cold or other upper respiratory infection.  Allergies.  An irritant, such as tobacco smoke.  Enlarged adenoids. The adenoids are areas of soft tissue located high in the back of the throat, behind the nose and the roof of the mouth. They are part of the body's defense system (immune system).  A mass in the nasopharynx.  Damage to the ear caused by pressure changes (barotrauma). What are the signs or symptoms? Symptoms of this condition include:  Ear pain.  Fever.  Decreased hearing.  Tiredness (lethargy).  Fluid leaking from the ear, if the eardrum is ruptured or has burst.  Ringing in the ear. How is this diagnosed? This condition is diagnosed with a physical exam. During the exam, your health care provider will use an instrument called an otoscope to look in your ear and check for redness, swelling, and fluid. He or she will also ask about your symptoms. Your health care provider may also order tests, such as:  A pneumatic otoscopy. This is a test to check the movement of the eardrum. It is done by squeezing a small amount of air into the ear.  A tympanogram is a test that shows how well  the eardrum moves in response to air pressure in the ear canal. It provides a graph for your health care provider to review.   How is this treated? This condition can go away on its own within 3-5 days. But if the condition is caused by a bacterial infection and does not go away on its own, or if it keeps coming back, your health care provider may:  Prescribe antibiotic medicine to treat the infection.  Prescribe or recommend medicines to control pain. Follow these instructions at home:  Take over-the-counter and prescription medicines only as told by your health care provider.  If you were prescribed an antibiotic medicine, take it as told by your health care provider. Do not stop taking the antibiotic even if you start to feel better.  Keep all follow-up visits as told by your health care provider. This is important. Contact a health care provider if:  You have bleeding from your nose.  There is a lump on your neck.  You are not feeling better in 5 days.  You feel worse instead of better. Get help right away if:  You have severe pain that is not controlled with medicine.  You have swelling, redness, or pain around your ear.  You have stiffness in your neck.  A part of your face is not moving (paralyzed).  The bone behind your ear (mastoid) is tender when you touch it.  You develop a severe headache. Summary  Otitis media is redness, soreness, and swelling of the middle ear, usually resulting in pain.  This condition can go away on its own within 3-5 days.  If the problem does not go away in 3-5 days, your health care provider may prescribe or recommend medicines to treat the infection or your symptoms.  If you were prescribed an antibiotic medicine, take it as told by your health care provider.  Follow all instructions you were given by your health care provider. This information is not intended to replace advice given to you by your health care provider. Make sure  you discuss any questions you have with your health care provider. Document Revised: 05/29/2019 Document Reviewed: 05/29/2019 Elsevier Patient Education  2021 [place]. Sinusitis, Adult Sinusitis is inflammation of your sinuses. Sinuses are hollow spaces in the bones around your face. Your sinuses are located:  Around your eyes.  In the middle of your forehead.  Behind your nose.  In your cheekbones. Mucus normally drains out of your sinuses. When your nasal tissues become inflamed or swollen, mucus can become trapped or blocked. This allows bacteria, viruses, and fungi to grow, which leads to infection. Most infections of the sinuses are caused by a virus. Sinusitis can develop quickly. It can last for up to 4 weeks (acute) or for more than 12 weeks (chronic). Sinusitis often develops after a cold. What are the causes? This condition is caused by anything that creates swelling in the sinuses or stops mucus from draining. This includes:  Allergies.  Asthma.  Infection from bacteria or viruses.  Deformities or blockages in your nose or sinuses.  Abnormal growths in the nose (nasal polyps).  Pollutants, such as chemicals or irritants in the air.  Infection from fungi (rare). What increases the risk? You are more likely to develop this condition if you:  Have a weak body defense system (immune system).  Do a lot of swimming or diving.  Overuse nasal sprays.  Smoke. What are the signs or symptoms? The main symptoms of this condition are pain and a feeling of pressure around the affected sinuses. Other symptoms include:  Stuffy nose or congestion.  Thick drainage from your nose.  Swelling and warmth over the affected sinuses.  Headache.  Upper toothache.  A cough that may get worse at night.  Extra mucus that collects in the throat or the back of the nose (postnasal drip).  Decreased sense of smell and taste.  Fatigue.  A fever.  Sore throat.  Bad  breath. How is this diagnosed? This condition is diagnosed based on:  Your symptoms.  Your medical history.  A physical exam.  Tests to find out if your condition is acute or chronic. This may include: ? Checking your nose for nasal polyps. ? Viewing your sinuses using a device that has a light (endoscope). ? Testing for allergies or bacteria. ? Imaging tests, such as an MRI or CT scan. In rare cases, a bone biopsy may be done to rule out more serious types of fungal sinus disease. How is this treated? Treatment for sinusitis depends on the cause and whether your condition is chronic or acute.  If caused by a virus, your symptoms should go away on their own within 10 days. You may be given medicines to relieve symptoms. They include: ? Medicines that shrink swollen nasal passages (topical intranasal decongestants). ? Medicines that treat allergies (antihistamines). ? A spray that eases inflammation of the nostrils (topical intranasal corticosteroids). ? Rinses that help  get rid of thick mucus in your nose (nasal saline washes).  If caused by bacteria, your health care provider may recommend waiting to see if your symptoms improve. Most bacterial infections will get better without antibiotic medicine. You may be given antibiotics if you have: ? A severe infection. ? A weak immune system.  If caused by narrow nasal passages or nasal polyps, you may need to have surgery. Follow these instructions at home: Medicines  Take, use, or apply over-the-counter and prescription medicines only as told by your health care provider. These may include nasal sprays.  If you were prescribed an antibiotic medicine, take it as told by your health care provider. Do not stop taking the antibiotic even if you start to feel better. Hydrate and humidify  Drink enough fluid to keep your urine pale yellow. Staying hydrated will help to thin your mucus.  Use a cool mist humidifier to keep the humidity  level in your home above 50%.  Inhale steam for 10-15 minutes, 3-4 times a day, or as told by your health care provider. You can do this in the bathroom while a hot shower is running.  Limit your exposure to cool or dry air.   Rest  Rest as much as possible.  Sleep with your head raised (elevated).  Make sure you get enough sleep each night. General instructions  Apply a warm, moist washcloth to your face 3-4 times a day or as told by your health care provider. This will help with discomfort.  Wash your hands often with soap and water to reduce your exposure to germs. If soap and water are not available, use hand sanitizer.  Do not smoke. Avoid being around people who are smoking (secondhand smoke).  Keep all follow-up visits as told by your health care provider. This is important.   Contact a health care provider if:  You have a fever.  Your symptoms get worse.  Your symptoms do not improve within 10 days. Get help right away if:  You have a severe headache.  You have persistent vomiting.  You have severe pain or swelling around your face or eyes.  You have vision problems.  You develop confusion.  Your neck is stiff.  You have trouble breathing. Summary  Sinusitis is soreness and inflammation of your sinuses. Sinuses are hollow spaces in the bones around your face.  This condition is caused by nasal tissues that become inflamed or swollen. The swelling traps or blocks the flow of mucus. This allows bacteria, viruses, and fungi to grow, which leads to infection.  If you were prescribed an antibiotic medicine, take it as told by your health care provider. Do not stop taking the antibiotic even if you start to feel better.  Keep all follow-up visits as told by your health care provider. This is important. This information is not intended to replace advice given to you by your health care provider. Make sure you discuss any questions you have with your health care  provider. Document Revised: 11/26/2017 Document Reviewed: 11/26/2017 Elsevier Patient Education  2021 [place]. How to Perform a Sinus Rinse A sinus rinse is a home treatment that is used to rinse your sinuses with a sterile mixture of salt and water (saline solution). Sinuses are air-filled spaces in your skull behind the bones of your face and forehead that open into your nasal cavity. A sinus rinse can help to clear mucus, dirt, dust, or pollen from your nasal cavity. You may do a  sinus rinse when you have a cold, a virus, nasal allergy symptoms, a sinus infection, or stuffiness in your nose or sinuses. Talk with your health care provider about whether a sinus rinse might help you. What are the risks? A sinus rinse is generally safe and effective. However, there are a few risks, which include:  A burning sensation in your sinuses. This may happen if you do not make the saline solution as directed. Be sure to follow all directions when making the saline solution.  Nasal irritation.  Infection from contaminated water. This is rare, but possible. Do not do a sinus rinse if you have had ear or nasal surgery, ear infection, or blocked ears. Supplies needed:  Saline solution or powder.  Distilled or sterile water to mix with saline powder. ? You may use boiled and cooled tap water. Boil tap water for 5 minutes; cool until it is lukewarm. Use within 24 hours. ? Do not use regular tap water to mix with the saline solution.  Neti pot or nasal rinse bottle. These supplies release the saline solution into your nose and through your sinuses. Neti pots and nasal rinse bottles can be purchased at [employer], a health food store, or online. How to perform a sinus rinse 1. Wash your hands with soap and water. 2. Wash your device according to the directions that came with the product and then dry it. 3. Use the solution that comes with your product or one that is sold separately in  stores. Follow the mixing directions on the package to mix with sterile or distilled water. 4. Fill the device with the amount of saline solution noted in the device instructions. 5. Stand over a sink and tilt your head sideways over the sink. 6. Place the spout of the device in your upper nostril (the one closer to the ceiling). 7. Gently pour or squeeze the saline solution into your nasal cavity. The liquid should drain out from the lower nostril if you are not too congested. 8. While rinsing, breathe through your open mouth. 9. Gently blow your nose to clear any mucus and rinse solution. Blowing too hard may cause ear pain. 10. Repeat in your other nostril. 11. Clean and rinse your device with clean water and then air-dry it. Talk with your health care provider or pharmacist if you have questions about how to do a sinus rinse.   Summary  A sinus rinse is a home treatment that is used to rinse your sinuses with a sterile mixture of salt and water (saline solution).  A sinus rinse is generally safe and effective. Follow all instructions carefully.  Before doing a sinus rinse, talk with your health care provider about whether it would be helpful for you. This information is not intended to replace advice given to you by your health care provider. Make sure you discuss any questions you have with your health care provider. Document Revised: 04/06/2020 Document Reviewed: 04/06/2020 Elsevier Patient Education  [place]. Eustachian Tube Dysfunction  Eustachian tube dysfunction refers to a condition in which a blockage develops in the narrow passage that connects the middle ear to the back of the nose (eustachian tube). The eustachian tube regulates air pressure in the middle ear by letting air move between the ear and nose. It also helps to drain fluid from the middle ear space. Eustachian tube dysfunction can affect one or both ears. When the eustachian tube does not function properly,  air pressure, fluid, or  both can build up in the middle ear. What are the causes? This condition occurs when the eustachian tube becomes blocked or cannot open normally. Common causes of this condition include:  Ear infections.  Colds and other infections that affect the nose, mouth, and throat (upper respiratory tract).  Allergies.  Irritation from cigarette smoke.  Irritation from stomach acid coming up into the esophagus (gastroesophageal reflux). The esophagus is the tube that carries food from the mouth to the stomach.  Sudden changes in air pressure, such as from descending in an airplane or scuba diving.  Abnormal growths in the nose or throat, such as: ? Growths that line the nose (nasal polyps). ? Abnormal growth of cells (tumors). ? Enlarged tissue at the back of the throat (adenoids). What increases the risk? You are more likely to develop this condition if:  You smoke.  You are overweight.  You are a child who has: ? Certain birth defects of the mouth, such as cleft palate. ? Large tonsils or adenoids. What are the signs or symptoms? Common symptoms of this condition include:  A feeling of fullness in the ear.  Ear pain.  Clicking or popping noises in the ear.  Ringing in the ear.  Hearing loss.  Loss of balance.  Dizziness. Symptoms may get worse when the air pressure around you changes, such as when you travel to an area of high elevation, fly on an airplane, or go scuba diving. How is this diagnosed? This condition may be diagnosed based on:  Your symptoms.  A physical exam of your ears, nose, and throat.  Tests, such as those that measure: ? The movement of your eardrum (tympanogram). ? Your hearing (audiometry). How is this treated? Treatment depends on the cause and severity of your condition.  In mild cases, you may relieve your symptoms by moving air into your ears. This is called "popping the ears."  In more severe cases, or if you  have symptoms of fluid in your ears, treatment may include: ? Medicines to relieve congestion (decongestants). ? Medicines that treat allergies (antihistamines). ? Nasal sprays or ear drops that contain medicines that reduce swelling (steroids). ? A procedure to drain the fluid in your eardrum (myringotomy). In this procedure, a small tube is placed in the eardrum to:  Drain the fluid.  Restore the air in the middle ear space. ? A procedure to insert a balloon device through the nose to inflate the opening of the eustachian tube (balloon dilation). Follow these instructions at home: Lifestyle  Do not do any of the following until your health care provider approves: ? Travel to high altitudes. ? Fly in airplanes. ? Work in a [employer] manager or room. ? Scuba dive.  Do not use any products that contain nicotine or tobacco, such as cigarettes and e-cigarettes. If you need help quitting, ask your health care provider.  Keep your ears dry. Wear fitted earplugs during showering and bathing. Dry your ears completely after. General instructions  Take over-the-counter and prescription medicines only as told by your health care provider.  Use techniques to help pop your ears as recommended by your health care provider. These may include: ? Chewing gum. ? Yawning. ? Frequent, forceful swallowing. ? Closing your mouth, holding your nose closed, and gently blowing as if you are trying to blow air out of your nose.  Keep all follow-up visits as told by your health care provider. This is important. Contact a health care provider if:  Your  symptoms do not go away after treatment.  Your symptoms come back after treatment.  You are unable to pop your ears.  You have: ? A fever. ? Pain in your ear. ? Pain in your head or neck. ? Fluid draining from your ear.  Your hearing suddenly changes.  You become very dizzy.  You lose your balance. Summary  Eustachian tube dysfunction refers  to a condition in which a blockage develops in the eustachian tube.  It can be caused by ear infections, allergies, inhaled irritants, or abnormal growths in the nose or throat.  Symptoms include ear pain, hearing loss, or ringing in the ears.  Mild cases are treated with maneuvers to unblock the ears, such as yawning or ear popping.  Severe cases are treated with medicines. Surgery may also be done (rare). This information is not intended to replace advice given to you by your health care provider. Make sure you discuss any questions you have with your health care provider. Document Revised: 10/16/2017 Document Reviewed: 10/16/2017 Elsevier Patient Education  [place].

## 2020-12-14 ENCOUNTER — Telehealth: Payer: Self-pay | Admitting: Registered Nurse

## 2020-12-14 ENCOUNTER — Encounter: Payer: Self-pay | Admitting: Registered Nurse

## 2020-12-14 DIAGNOSIS — Z20822 Contact with and (suspected) exposure to covid-19: Secondary | ICD-10-CM

## 2020-12-14 NOTE — Telephone Encounter (Signed)
Notified by [employer]  "I spoke with Kelly Reed a few minutes ago and she shared that her son that lives with them has tested positive for COVID. She did say they can quarantine apart in the home. She is boosted and not having any symptoms other than her normal ones that she said the clinic is aware of (asthma- normal allergies) which she has all the time.  For now, I told her that she could return to work, wear a mask the next 10 days, but if something changes with her symptoms to let us know.  If you feel differently, please give her a call at [phone]."  Following CDC guidelines agreed with plan of care and will follow up with patient via telephone tomorrow as NP clinic hours ended at 1400 today.

## 2020-12-15 NOTE — Telephone Encounter (Signed)
Patient contacted via telephone stated son initially had back pain they thought his kidney problems acting up then fever/cold symptoms and tested positive for covid yesterday.  Last home test used up yesterday.  Assisted patient to order more tests from [place] govt free site today  Confirmation below.  Thank You! Your order has been placed and will be shipped in #2 packages.  Here are your order confirmation numbers:  Package 1 #: [id number]  Package 2 #: [id number]  If you provided your email address, we'll send you #2 confirmation emails, #2 emails with tracking numbers (#1 for each of your packages), and delivery updates.  Is this the first or second order for this address? If so, you can place another order.  Contact Information [email or web address]  Shipping Address [address]  [place], [place] [address] [place]  Order Summary Free At-Home COVID-19 Tests Order of #8 tests  Subtotal $0.00 Shipping & Handling $0.00 Total $0.00  Patient to do home test day 5 after exposure  6/12.  Day 10 6/17  Patient monitoring for covid symptoms discussed omicron typically masquerading/presenting like allergies/cold.  Patient to not eat in employee lunch room through day 10.  Patient stated asymptomatic and had to do covid test prior to her allergy immunotherapy injection.  Patient sanitizing high touch surfaces in the home.  Son quarantining away from family.  Encouraged them to wear mask if he is out of room.  Patient to call me at [phone] if symptoms/questions.  Patient A&Ox3 spoke full sentences without difficulty no congestion nasal/cough/throat clearing during 10 minute telephone call.  HR notified patient asymptomatic, negative home test and monitoring for symptoms/wearing mask strict through day 10 12/24/20.  Discussed if she tests positive I would recommend oral antiviral treatment due to her risk factors  for severe covid moderate score 3 and history of prolonged bronchitis whenever she has sinusitis/URI.  Patient verbalized understanding information/instructions, agreed with plan of care and had no further questions at this time.

## 2020-12-19 NOTE — Telephone Encounter (Signed)
Patient returned call ordered home tests didn't arrive will pick one up from pharmacy tomorrow and test.  Still asymptomatic spoke full sentences without difficulty no cough/nasal congestion or throat clearing.  HR notified still cleared to be onsite with mask wear and testing rescheduled for 12/20/20.  Discussed [facility] billing and reimbursement options with patient.  She is to notify us where purchased and test brand to enter Rx for her.  Patient verbalized understanding information/instructions, agreed with plan of care and had no further questions at this time.

## 2020-12-28 NOTE — Telephone Encounter (Signed)
Patient contacted clinic today stating the free [place] government covid tests still have not arrived and she has not received any emails.  RN Hildred Alamin using order number to assist patient today to see if they have shipped yet.

## 2020-12-29 ENCOUNTER — Ambulatory Visit: Payer: PRIVATE HEALTH INSURANCE | Admitting: Internal Medicine

## 2021-01-12 ENCOUNTER — Other Ambulatory Visit: Payer: Self-pay

## 2021-01-12 ENCOUNTER — Encounter: Payer: Self-pay | Admitting: Internal Medicine

## 2021-01-12 ENCOUNTER — Ambulatory Visit: Payer: PRIVATE HEALTH INSURANCE | Admitting: Internal Medicine

## 2021-01-12 VITALS — BP 134/80 | HR 80 | Temp 97.9°F | Resp 18 | Ht 71.0 in | Wt 213.4 lb

## 2021-01-12 DIAGNOSIS — I10 Essential (primary) hypertension: Secondary | ICD-10-CM

## 2021-01-12 DIAGNOSIS — J4540 Moderate persistent asthma, uncomplicated: Secondary | ICD-10-CM | POA: Diagnosis not present

## 2021-01-12 DIAGNOSIS — F32A Depression, unspecified: Secondary | ICD-10-CM

## 2021-01-12 DIAGNOSIS — E118 Type 2 diabetes mellitus with unspecified complications: Secondary | ICD-10-CM | POA: Diagnosis not present

## 2021-01-12 DIAGNOSIS — R5383 Other fatigue: Secondary | ICD-10-CM

## 2021-01-12 DIAGNOSIS — F419 Anxiety disorder, unspecified: Secondary | ICD-10-CM | POA: Diagnosis not present

## 2021-01-12 DIAGNOSIS — E785 Hyperlipidemia, unspecified: Secondary | ICD-10-CM

## 2021-01-12 DIAGNOSIS — E1169 Type 2 diabetes mellitus with other specified complication: Secondary | ICD-10-CM

## 2021-01-12 LAB — HEMOGLOBIN A1C: Hgb A1c MFr Bld: 7.9 % — ABNORMAL HIGH (ref 4.6–6.5)

## 2021-01-12 LAB — COMPREHENSIVE METABOLIC PANEL
ALT: 69 U/L — ABNORMAL HIGH (ref 0–35)
AST: 51 U/L — ABNORMAL HIGH (ref 0–37)
Albumin: 4.5 g/dL (ref 3.5–5.2)
Alkaline Phosphatase: 64 U/L (ref 39–117)
BUN: 11 mg/dL (ref 6–23)
CO2: 27 mEq/L (ref 19–32)
Calcium: 9.6 mg/dL (ref 8.4–10.5)
Chloride: 101 mEq/L (ref 96–112)
Creatinine, Ser: 0.69 mg/dL (ref 0.40–1.20)
GFR: 96.84 mL/min (ref >60.00)
Glucose, Bld: 147 mg/dL — ABNORMAL HIGH (ref 70–99)
Potassium: 3.8 mEq/L (ref 3.5–5.1)
Sodium: 137 mEq/L (ref 135–145)
Total Bilirubin: 0.5 mg/dL (ref 0.2–1.2)
Total Protein: 7.4 g/dL (ref 6.0–8.3)

## 2021-01-12 LAB — VITAMIN D 25 HYDROXY (VIT D DEFICIENCY, FRACTURES): VITD: 40.89 ng/mL (ref 30.00–100.00)

## 2021-01-12 LAB — LIPID PANEL
Cholesterol: 183 mg/dL (ref 0–200)
HDL: 57.9 mg/dL (ref >39.00)
LDL Cholesterol: 93 mg/dL (ref 0–99)
NonHDL: 124.81
Total CHOL/HDL Ratio: 3
Triglycerides: 157 mg/dL — ABNORMAL HIGH (ref 0.0–149.0)
VLDL: 31.4 mg/dL (ref 0.0–40.0)

## 2021-01-12 LAB — TSH: TSH: 0.72 u[IU]/mL (ref 0.35–5.50)

## 2021-01-12 LAB — VITAMIN B12: Vitamin B-12: 257 pg/mL (ref 211–911)

## 2021-01-12 NOTE — Progress Notes (Signed)
   Subjective:   Patient ID: Kelly Reed, female    DOB: [birth year], 55 y.o.   MRN: [id number]  HPI The patient is a new 55 YO female coming in for asthma (sees asthma/allergy doctor, they do not do FMLA papers at all, she is taking albuterol and singular and adviar and symbicort, has flares multiple times per year, needs at least 2-3 days out for flares, no flare currently), and diabetes (taking trulicity and metformin, on statin and denies numbness in feet/hands, no low sugars), and blood pressure (taking hctz 25 mg daily, denies side effects), and a few other concerns).   My eye doctor friendly for vision exam  1-2 episodes 3 days per episode for FMLA  Review of Systems  Constitutional: Negative.   HENT: Negative.    Eyes: Negative.   Respiratory:  Positive for shortness of breath. Negative for cough and chest tightness.   Cardiovascular:  Negative for chest pain, palpitations and leg swelling.  Gastrointestinal:  Negative for abdominal distention, abdominal pain, constipation, diarrhea, nausea and vomiting.  Musculoskeletal: Negative.   Skin: Negative.   Neurological: Negative.   Psychiatric/Behavioral: Negative.     Objective:  Physical Exam Constitutional:      Appearance: She is well-developed.  HENT:     Head: Normocephalic and atraumatic.  Cardiovascular:     Rate and Rhythm: Normal rate and regular rhythm.  Pulmonary:     Effort: Pulmonary effort is normal. No respiratory distress.     Breath sounds: Normal breath sounds. No wheezing or rales.  Abdominal:     General: Bowel sounds are normal. There is no distension.     Palpations: Abdomen is soft.     Tenderness: There is no abdominal tenderness. There is no rebound.  Musculoskeletal:     Cervical back: Normal range of motion.  Skin:    General: Skin is warm and dry.     Comments: Foot exam done.  Neurological:     Mental Status: She is alert and oriented to person, place, and time.     Coordination:  Coordination normal.    Vitals:   01/12/21 0933  BP: 134/80  Pulse: 80  Resp: 18  Temp: 97.9 F (36.6 C)  TempSrc: Oral  SpO2: 96%  Weight: 213 lb 6.4 oz (96.8 kg)  Height: 5\' 11"  (1.803 m)    This visit occurred during the SARS-CoV-2 public health emergency.  Safety protocols were in place, including screening questions prior to the visit, additional usage of staff PPE, and extensive cleaning of exam room while observing appropriate contact time as indicated for disinfecting solutions.   Assessment & Plan:

## 2021-01-12 NOTE — Patient Instructions (Signed)
We will check the labs today and can do the FMLA paperwork for you.

## 2021-01-14 NOTE — Assessment & Plan Note (Signed)
Taking hctz 25 mg daily. Checking CMP and adjust as needed.

## 2021-01-14 NOTE — Assessment & Plan Note (Signed)
Checking TSH and B12 and vitamin D and adjust as needed. Could be related to anxiety/depression or diabetes.

## 2021-01-14 NOTE — Assessment & Plan Note (Signed)
Taking crestor. Checking lipid panel and adjust as needed.

## 2021-01-14 NOTE — Assessment & Plan Note (Signed)
Taking metformin and trulicity. Foot exam done. Checking HgA1c and lipid panel. On statin and not on ACE-I or ARB. Adjust as needed. Advised her that working with her asthma specialist to avoid flare and make sure to take her asthma medications faithfully will help her avoid prednisone which will help her manage her diabetes as prednisone will exacerbate her sugar levels.

## 2021-01-14 NOTE — Assessment & Plan Note (Signed)
Taking lexapro 20 mg daily and is not sure this is great for control not sure she wants to change today.

## 2021-01-14 NOTE — Assessment & Plan Note (Signed)
Taking singulair and otc as well and flonase. Seeing allergy and asthma.

## 2021-01-14 NOTE — Assessment & Plan Note (Signed)
Moderate with persistent features. No flare today. Taking advair and singulair and symbicort and albuterol prn. Advised that I would fill out FMLA for her.

## 2021-01-18 NOTE — Telephone Encounter (Signed)
Notified by patient coworker when attempted to speak with patient in her workcenter today she called in sick/not coming in today.

## 2021-01-20 ENCOUNTER — Telehealth: Payer: Self-pay | Admitting: Internal Medicine

## 2021-01-20 NOTE — Telephone Encounter (Signed)
Type of form received: FMLA   Additional comments: N/A   Received by: [employer]   Form should be Faxed to:[phone]  Form should be mailed to:  no   Is patient requesting call for pickup: no    Form placed in the Provider's box.  Attach charge sheet.  Provider will determine charge.  Was patient informed of  7-10 business day turn around (Y/N)? No

## 2021-01-20 NOTE — Telephone Encounter (Signed)
Noted  

## 2021-01-21 NOTE — Telephone Encounter (Signed)
Patient seen in workcenter today.  Reported she had GI bug the previous couple days after eating bad salad from [facility].  She was not aware that she had telephoned me.  Feeling better today back at work 01/20/21 tolerating po intake without difficulty.  No further questions or concerns at this time. A&Ox3 respirations even and unlabored. Skin warm dry and pink gait sure and steady in workcenter spoke full sentences without difficulty; no cough/congestion/throat clearing.  No quarantine or covid testing indicated at this time.  HR notified  Patient verbalized understanding information/instructions, agreed with plan of care and had no further questions at this time.

## 2021-02-01 NOTE — Telephone Encounter (Signed)
pt called inquiring about her FMLA forms as she has received a letter stating the last date for these forms to be faxed in is tomorrow 02/02/2021.  *Please give pt a call to update on the status of the forms.

## 2021-02-03 NOTE — Telephone Encounter (Signed)
FMLA paperwork was faxed on 01/11/2021 and was re-faxed on 02/02/2021.

## 2021-02-09 NOTE — Telephone Encounter (Signed)
Follow up message  Patient calling to request FMLA form , page 1 be completed with providers information, and re-faxed.   Please call patient

## 2021-02-15 ENCOUNTER — Telehealth: Payer: Self-pay | Admitting: Registered Nurse

## 2021-02-15 ENCOUNTER — Encounter: Payer: Self-pay | Admitting: Registered Nurse

## 2021-02-15 DIAGNOSIS — M79672 Pain in left foot: Secondary | ICD-10-CM

## 2021-02-15 NOTE — Telephone Encounter (Signed)
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  Pain resolved.  Gait sure and steady in warehouse.

## 2021-02-15 NOTE — Telephone Encounter (Signed)
Fax received from [facility] PA weight bearing as tolerated and no ladder climbing until follow up appt.  RN Hildred Alamin delivered to [employer].

## 2021-02-15 NOTE — Telephone Encounter (Signed)
Patient seen at [facility] yesterday for possible cuneiform fracture left placed in camboot and weight bearing as tolerated restriction.  Patient stated provider noticed defect in bone but unsure if through and through fracture at this time.  Patient reported dropped wardrobe board on foot assembling wardrobe this weekend.  Not a work related injury.  Patient reported pain and swelling and balloon on medial side left camboot rubbing skin she only has ankle socks will wear taller socks tomorrow.  Given 4 Telfa pads from clinic stock and aquaphor ointment to place over affected area to prevent friction/blister/skin breakdown while at work today.  Patient gait sure and steady in clinic, skin warm dry and pink A&Ox3 spoke full sentences without difficulty.  Patient contacted provider's office and clinic fax number given as no work note received yesterday.  Patient also asked that no climbing ladders be added to work note, weight bearing as tolerated until follow up appt in 2 weeks  Note to be faxed to [employer].  Patient verbalized understanding information/instructions, agreed with plan of care and had no further questions at this time.

## 2021-02-18 NOTE — Telephone Encounter (Signed)
Called patient went straight to vm. LVM letting her know that I will re-fax her FMLA paperwork. Office number was provided.

## 2021-02-18 NOTE — Telephone Encounter (Signed)
Insurance company called patient & advised her that form was incomplete & that they were going to refax form to [place] to be filled out  Patient says she is not sure what info is missing bc she does not have the forms   Patient says she has been req for someone to call her back but no one has   Please call [phone]

## 2021-02-22 NOTE — Telephone Encounter (Signed)
Spoke with Kelly Reed at [facility] who was able to pull up the patient's FMLA form. She was able to transfer me to [facility] which is when I spoke with Kelly Reed who re-verified the patient's information. She stated that she would fax the paperwork directly to me at [phone].   I spoke with the patient to let her know that I was able to speak with insurance and them resending the paperwork. She was very appreciative for the update. I advised that once I receive it and its completed that she can come pick up the original while we keep the copy and fax it.

## 2021-02-28 NOTE — Telephone Encounter (Signed)
Pt received a letter that it had been denied. Stated she hasn't heard from [place] regarding the paperwork.   Requesting a callback.   [phone]

## 2021-03-01 NOTE — Telephone Encounter (Signed)
Spoke with the patient to let her know that I never received the paperwork from her insurance company in regards to her FMLA. She stated that she would contact them and have them re-fax the paperwork. She was very appreciative for the call.

## 2021-03-17 ENCOUNTER — Ambulatory Visit: Payer: Self-pay | Admitting: Registered Nurse

## 2021-03-17 ENCOUNTER — Encounter: Payer: Self-pay | Admitting: Registered Nurse

## 2021-03-17 ENCOUNTER — Other Ambulatory Visit: Payer: Self-pay

## 2021-03-17 VITALS — BP 138/90 | HR 92 | Temp 99.3°F

## 2021-03-17 DIAGNOSIS — H1013 Acute atopic conjunctivitis, bilateral: Secondary | ICD-10-CM

## 2021-03-17 MED ORDER — KETOTIFEN FUMARATE 0.025 % OP SOLN: 1.0000 [drp] | Freq: Two times a day (BID) | OPHTHALMIC | 0 refills | Status: AC

## 2021-03-17 MED ORDER — CARBOXYMETHYLCELLULOSE SOD PF 0.5 % OP SOLN: 1.0000 [drp] | Freq: Three times a day (TID) | OPHTHALMIC | 0 refills | Status: AC | PRN

## 2021-03-17 NOTE — Patient Instructions (Signed)
Allergic Conjunctivitis, Adult Allergic conjunctivitis is inflammation of the conjunctiva. The conjunctiva is the thin, clear membrane that covers the white part of the eye and the inner surface of the eyelid. In this condition: The blood vessels in the conjunctiva become irritated and swell. The eyes become red or pink and feel itchy. Allergic conjunctivitis cannot be spread from person to person. This condition can develop at any age and may be outgrown. What are the causes? This condition is caused by allergens. These are things that can cause an allergic reaction in some people but not in other people. Common allergens include: Outdoor allergens, such as: Pollen, including pollen from grass and weeds. Mold spores. Car fumes. Indoor allergens, such as: Dust. Smoke. Mold spores. Proteins in a pet's urine, saliva, or dander. What increases the risk? You may be more likely to develop this condition if you have a family history of these things: Allergies. Conditions caused by being exposed to allergens, such as: Allergic rhinitis. This is an allergic reaction that affects the nose. Bronchial asthma. This condition affects the large airways in the lungs and makes breathing difficult. Atopic dermatitis (eczema). This is inflammation of the skin that is long-term (chronic). What are the signs or symptoms? Symptoms of this condition include eyes that are: Itchy. Red. Watery. Puffy. Your eyes may also: Sting or burn. Have clear fluid draining from them. Have thick mucus discharge and pain (vernal conjunctivitis). How is this diagnosed? This condition may be diagnosed by: Your medical history. A physical exam. Tests of the fluid draining from your eyes to rule out other causes. Other tests to confirm the diagnosis, including: Testing for allergies. The skin may be pricked with a tiny needle. The pricked area is then exposed to small amounts of allergens. Testing for other eye  conditions. Tests may include: Blood tests. Tissue scrapings from your eyelid. The tissue is then checked under a microscope. How is this treated? This condition may be treated with: Cold, wet cloths (cold compresses) to soothe itching and swelling. Washing the face to remove allergens. Eye drops. These may be prescription or over-the-counter. You may need to try different types to see which one works best for you, such as: Eye drops that block the allergic reaction (antihistamine). Eye drops that reduce swelling and irritation (anti-inflammatory). Steroid eye drops, which may be given if other treatments have not worked (vernal conjunctivitis). Oral antihistamine medicines. These are medicines taken by mouth to lessen your allergic reaction. You may need these if eye drops do not help or are difficult to use. Follow these instructions at home: Eye care Apply a clean, cold compress to your eyes for 10-20 minutes, 3-4 times a day. Do not touch or rub your eyes. Do not wear contact lenses until the inflammation is gone. Wear glasses instead. Do not wear eye makeup until the inflammation is gone. General instructions Avoid known allergens whenever possible. Take or apply over-the-counter and prescription medicines only as told by your health care provider. These include any eye drops. Drink enough fluid to keep your urine pale yellow. Keep all follow-up visits as told by your health care provider. This is important. Contact a health care provider if: Your symptoms get worse or do not get better with treatment. You have mild eye pain. You become sensitive to light. You have spots or blisters on your eyes. You have pus draining from your eyes. You have a fever. Get help right away if: You have redness, swelling, or other symptoms in  only one eye. Your vision is blurred or you have other vision changes. You have severe eye pain. Summary Allergic conjunctivitis is inflammation of the  clear membrane that covers the white part of the eye and the inner surface of the eyelid. Take or apply over-the-counter and prescription medicines only as told by your health care provider. These include eye drops. Do not touch or rub your eyes. Contact a health care provider if your symptoms get worse or do not get better with treatment. This information is not intended to replace advice given to you by your health care provider. Make sure you discuss any questions you have with your health care provider. Document Revised: 05/19/2019 Document Reviewed: 05/19/2019 Elsevier Patient Education  2022 [employer].

## 2021-03-17 NOTE — Progress Notes (Signed)
Subjective:    Patient ID: Kelly Reed, female    DOB: 12/17/1965, 55 y.o.   MRN: [id number]  54y/o Caucasian established female pt c/o eyes being itchy, watery with eyelids swollen and red. Relates it to her allergies and has been present 3 days. Requesting a prescription strength allergy eye drop.   Stated Randel Pigg PA-C prescribed for her years ago and was $100.  Reviewed paper chart and Bepreve was prescribed.  Patient has tried ketotifen and didn't help.  Just started OTC opcon-A pheniramine, naphazoline ophthalmic today and seems to be helping a little more than ketotifen.  Feels like her eyes swollen still and itching eyelids/eyes.  Still taking her xyzal and singulair.  Denied fever/chills/rhinitis/cough/eye discharge/visual changes.  Symptoms worse at work improve some when at home.  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     Review of Systems  Constitutional:  Negative for activity change, appetite change, chills, diaphoresis, fatigue and fever.  HENT:  Negative for congestion, drooling, ear pain, mouth sores, postnasal drip, rhinorrhea, sinus pressure, sinus pain, sneezing, trouble swallowing and voice change.   Eyes:  Positive for redness and itching. Negative for photophobia, discharge and visual disturbance.  Respiratory:  Negative for cough, choking, chest tightness, shortness of breath, wheezing and stridor.   Cardiovascular:  Negative for chest pain.  Gastrointestinal:  Negative for diarrhea, nausea and vomiting.  Endocrine: Negative for cold intolerance and heat intolerance.  Genitourinary:  Negative for difficulty urinating.  Musculoskeletal:  Negative for back pain, gait problem, neck pain and neck stiffness.  Skin:  Positive for color change and rash. Negative for pallor and wound.  Allergic/Immunologic: Positive for environmental allergies and immunocompromised state. Negative for food allergies.  Neurological:  Negative for  dizziness, tremors, seizures, syncope, facial asymmetry, speech difficulty, weakness, light-headedness, numbness and headaches.  Hematological:  Negative for adenopathy. Does not bruise/bleed easily.  Psychiatric/Behavioral:  Negative for agitation, confusion and sleep disturbance.       Objective:   Physical Exam Vitals and nursing note reviewed.  Constitutional:      General: She is awake. She is not in acute distress.    Appearance: Normal appearance. She is well-developed and well-groomed. She is obese. She is not ill-appearing, toxic-appearing or diaphoretic.  HENT:     Head: Normocephalic and atraumatic. No raccoon eyes, abrasion, right periorbital erythema or left periorbital erythema.     Jaw: There is normal jaw occlusion.     Salivary Glands: Right salivary gland is not diffusely enlarged. Left salivary gland is not diffusely enlarged.     Right Ear: Hearing and external ear normal.     Left Ear: Hearing and external ear normal.     Nose: Nose normal. No congestion or rhinorrhea.     Mouth/Throat:     Lips: Pink. No lesions.     Mouth: Mucous membranes are moist.     Pharynx: Oropharynx is clear.  Eyes:     General: Vision grossly intact. Gaze aligned appropriately. Allergic shiner present. No visual field deficit or scleral icterus.       Right eye: No foreign body, discharge or hordeolum.        Left eye: No foreign body, discharge or hordeolum.     Extraocular Movements: Extraocular movements intact.     Right eye: Normal extraocular motion and no nystagmus.     Left eye: Normal extraocular motion and no nystagmus.     Conjunctiva/sclera:     Right eye:  Right conjunctiva is injected. No chemosis, exudate or hemorrhage.    Left eye: Left conjunctiva is injected. No chemosis, exudate or hemorrhage.    Pupils: Pupils are equal, round, and reactive to light.     Comments: Bilateral upper and lower eyelids slight erythema (pink) 0-1+/4 nonpitting edema; no debris in eyebrows  or eyelashes  Neck:     Trachea: Trachea and phonation normal. No tracheal deviation.  Cardiovascular:     Rate and Rhythm: Normal rate and regular rhythm.     Pulses: Normal pulses.          Radial pulses are 2+ on the right side and 2+ on the left side.  Pulmonary:     Effort: Pulmonary effort is normal. No respiratory distress.     Breath sounds: Normal breath sounds and air entry. No stridor, decreased air movement or transmitted upper airway sounds. No decreased breath sounds or wheezing.     Comments: Spoke full sentences without difficulty; no cough observed in exam room; wearing mask surgical due to covid 19 pandemic employer requires onsite Abdominal:     General: Abdomen is flat.  Musculoskeletal:        General: Normal range of motion.     Cervical back: Normal range of motion and neck supple. No edema, erythema, signs of trauma, rigidity or crepitus. No pain with movement. Normal range of motion.  Lymphadenopathy:     Head:     Right side of head: No submandibular or preauricular adenopathy.     Left side of head: No submandibular or preauricular adenopathy.     Cervical:     Right cervical: No superficial cervical adenopathy.    Left cervical: No superficial cervical adenopathy.  Skin:    General: Skin is warm and dry.     Capillary Refill: Capillary refill takes less than 2 seconds.     Coloration: Skin is not ashen, cyanotic, jaundiced, mottled, pale or sallow.     Findings: Rash present. No abrasion, abscess, acne, bruising, burn, ecchymosis, erythema, signs of injury, laceration, lesion, petechiae or wound. Rash is macular. Rash is not crusting, nodular, papular, purpuric, pustular, scaling, urticarial or vesicular.     Nails: There is no clubbing.          Comments: Bilateral upper and lower eyelids fine macular erythema nonpitting edema mid to lash line  Neurological:     General: No focal deficit present.     Mental Status: She is alert and oriented to person,  place, and time. Mental status is at baseline.     GCS: GCS eye subscore is 4. GCS verbal subscore is 5. GCS motor subscore is 6.     Cranial Nerves: Cranial nerves are intact. No cranial nerve deficit, dysarthria or facial asymmetry.     Sensory: Sensation is intact.     Motor: Motor function is intact. No weakness, tremor, atrophy, abnormal muscle tone or seizure activity.     Coordination: Coordination is intact. Coordination normal.     Gait: Gait is intact. Gait normal.     Comments: In/out of chair without difficulty; gait sure and steady in clinic; bilateral hand grasp equal 5/5  Psychiatric:        Attention and Perception: Attention and perception normal.        Mood and Affect: Mood and affect normal.        Speech: Speech normal.        Behavior: Behavior normal. Behavior is cooperative.  Thought Content: Thought content normal.        Cognition and Memory: Cognition and memory normal.        Judgment: Judgment normal.         Assessment & Plan:   A-allergic conjunctivitis  P-Cleared for work Nurse, mental health discussed. Patient to apply warm or cool packs prn right eye 5 minutes TID prn or warm compress.  Refresh drops 2 drops both eyes  TID x 7 days given has at home. Continue Opcon-A per manufacturer instructions.  If no improvement in symptoms will send electronic Rx bepreve 1.5% 1 gtt ou BID #1 RF0  to her pharmacy of choice [facility] with good RX coupon $78.03  [employer] requires prior authorization and copay is $144 per medication cost estimator. Shower after [facility].  Discussed blinking irritates eye blood vessels further as eyelid vessels rub on bulbar vessals. Instructed patient to not rub eyes. May need to wash pillowcases more frequently until inflammation resolves especially if discharge noted on pillow. May use over the counter eye drops/tears such as visine or ketotifen per manufacturer instructions for pain/symptom relief. Return to clinic if headache,  fever greater than 100.36F, nausea/vomiting, purulent discharge/matting unable to open eye without using fingers after 24 hours of medication use, foreign body sensation, ciliary flush, worsening photophobia or vision. Discussed to see optometrist same day if visual field loss, worsening light sensitivity, orbital swelling.  Call or return to clinic as needed if these symptoms worsen or fail to improve as anticipated. Exitcare handout on allergic conjunctivitis printed and given to patient. Patient verbalized agreement and understanding of treatment plan and had no further questions at this time.  P2: Hand washing, avoid contact use-wear glasses.

## 2021-03-18 ENCOUNTER — Telehealth: Payer: Self-pay | Admitting: Registered Nurse

## 2021-03-18 DIAGNOSIS — H1013 Acute atopic conjunctivitis, bilateral: Secondary | ICD-10-CM

## 2021-03-18 MED ORDER — BEPOTASTINE BESILATE 1.5 % OP SOLN: 1.0000 [drp] | Freq: Two times a day (BID) | OPHTHALMIC | 1 refills | Status: DC | PRN

## 2021-03-18 NOTE — Telephone Encounter (Signed)
Patient stated symptoms worsening today requested bepreve rx be sent in to [facility] .  Discussed goodrx coupon will need to be shown to pharmacist for discount.  Electronic rx sent generic bepreve 1 gtt ou bid prn itching #1 RF1.  Continue singulair, xyzal, refresh eye drops and stop opcon-A eye drops.  Patient verbalized understanding information/instructions, agreed with plan of care and had no further questions at this time.

## 2021-03-22 ENCOUNTER — Telehealth: Payer: Self-pay | Admitting: Registered Nurse

## 2021-03-22 ENCOUNTER — Encounter: Payer: Self-pay | Admitting: Registered Nurse

## 2021-03-22 DIAGNOSIS — M25571 Pain in right ankle and joints of right foot: Secondary | ICD-10-CM

## 2021-03-22 NOTE — Telephone Encounter (Signed)
RN Hildred Alamin notified me patient injured at work and was instructed to see [facility] yesterday as established with provider.  Per Epic patient was seen yesterday by PA Gerrit Halls and diagnosed with right sprained ankle/foot but unable to view entire note at this time.  RN Hildred Alamin was to follow up with patient today via telephone to see if work restrictions received/note given to HR.  Patient was scheduled for appt with me today but no showed and when called workcenter was not onsite today.

## 2021-03-23 ENCOUNTER — Telehealth: Payer: Self-pay | Admitting: Internal Medicine

## 2021-03-23 DIAGNOSIS — E1165 Type 2 diabetes mellitus with hyperglycemia: Secondary | ICD-10-CM

## 2021-03-23 MED ORDER — METFORMIN HCL 1000 MG PO TABS: 1000.0000 mg | ORAL_TABLET | Freq: Two times a day (BID) | ORAL | 3 refills | Status: DC

## 2021-03-23 NOTE — Telephone Encounter (Signed)
Ok to refill since it wasn't prescribed by you originally? Please advise

## 2021-03-23 NOTE — Telephone Encounter (Signed)
    Patient calling to request order for metFORMIN (GLUCOPHAGE) 1000 MG tablet  [place], [place]

## 2021-03-29 NOTE — Telephone Encounter (Signed)
Patient is out of work until next follow up appt end of September with orthopedics provider.  HR notified.  Reviewed documentation in care everywhere orthopedics provider.

## 2021-04-08 NOTE — Telephone Encounter (Signed)
Reviewed Epic  [facility] cleared patient to return to work.  "The patient is a 55 year old female with an injury on 03/21/2021, initially seen by Gerrit Halls, PA-C. Symptoms have improved. She is ambulating without pain or discomfort. We discussed continued home exercises, purchasing a wobble board, and follow-up as needed. We will provide a return to work note for 04/04/2021. "  Patient in clinic today to sign [employer] Well paperwork, wearing normal sneakers, gait sure and steady, skin warm dry and pink and A&Ox3. Patient reported she gave HR staff RTW note.   Encounter closed.

## 2021-04-13 ENCOUNTER — Telehealth: Payer: Self-pay | Admitting: Registered Nurse

## 2021-04-13 DIAGNOSIS — U071 COVID-19: Secondary | ICD-10-CM

## 2021-04-13 MED ORDER — MOLNUPIRAVIR EUA 200MG CAPSULE: 4.0000 | ORAL_CAPSULE | Freq: Two times a day (BID) | ORAL | 0 refills | Status: AC

## 2021-04-13 MED ORDER — PREDNISONE 10 MG (21) PO TBPK: ORAL_TABLET | ORAL | 0 refills | Status: DC

## 2021-04-13 MED ORDER — FLUTICASONE PROPIONATE 50 MCG/ACT NA SUSP: 1.0000 | Freq: Two times a day (BID) | NASAL | 6 refills | Status: DC

## 2021-04-13 MED ORDER — SALINE SPRAY 0.65 % NA SOLN: 2.0000 | NASAL | 0 refills | Status: DC

## 2021-04-13 NOTE — Telephone Encounter (Signed)
Late entry  HR notified NP patient reported spouse with positive covid test 04/11/21 and to contact employee.  Patient reported asymptomatic and quarantining separate from spouse.  Wearing mask if in same room as spouse.  Discussed virus spread mainly respiratory but can also be spread via surfaces and to disinfect/sanitize high touch surfaces once a day recommended with clorox/bleach/lysol spray or wipes.  Patient stated she had already disinfected high touch surfaces in house today. Discussed repeat test Day 5 10/08 home covid and patient verified she has tests available at home. Patient cleared to return onsite with mask wear 04/12/21 and no eating in employee lunch room through Day 10 04/21/21 and monitoring for symptoms.  If symptoms occur at work to go home and if at home stay home quarantine and contact clinic staff.  HR notified asymptomatic and negative test 04/11/21 cleared to return onsite with mask wear and no eating in employee lunch room.     [employer]a notified NP that pt reported symptoms and positive covid test home 04/13/21 and to contact patient.  Patient contacted via telephone and stated woke up middle of night with cough, headache, congestion and fatigue.  Took covid test home and positive today.  Patient did not develop symptoms of  trouble breathing, chest pain, nausea, vomiting, or diarrhea.   5 day quarantine per [facility] recommendations. Day 1 of quarantine was 04/13/21.  Patient to contact me if vomiting after coughing or unable to tolerate po fluids. If GI upset I have recommended clear fluids then bland diet.  Avoid dairy/spicy, fried and large portions of meat while having nausea.  If vomiting hold po intake x 1 hour.  Then sips clear fluids like broths, ginger ale, power ade, gatorade, pedialyte may advance to soft/bland if no vomiting x 24 hours and appetite returned otherwise hydration main focus. Call me at work from home number  [phone] symptoms not improved with plan  of care  patient to call if high fever, dehydration, marked weakness, fainting, increased abdominal pain, blood in stool or vomit (red or black).     Reviewed possible Covid symptoms including cough, shortness of breath with exertion or at rest, runny nose, congestion, sinus pain/pressure, sore throat, fever/chills, body aches, fatigue, loss of taste/smell, GI symptoms of nausea/vomiting/diarrhea. Also reviewed same day/emergent eval/ER precautions of dizziness/syncope, confusion, blue tint to lips/face, severe shortness of breath/difficulty breathing/wheezing.   Patient does not have sp02 monitor at home; will mail clinic loaner tomorrow when onsite at clinic to patient and she is to notify clinic staff if sp02 less than 90 or pulse greater than 100.  Reviewed common side effects of covid antivirals with patient e.g. GI upset, metallic taste in mouth, fatigue.  EUA handout emailed to patient at [email or web address]  Paxlovid interacts with her metformin/trulicity/crestor/symbicort/flonase per epocrates.  Will rx molnupiravir 200mg  take 4 capsules BID x 5 days #40 RF0 electronic Rx sent to her pharmacy of choice.  Patient notified to start as soon as possible for best response and dose twice a day x 5 days by mouth. [email or web address]  Patient to isolate in own room and if possible use only one bathroom if living with others in home.  Wear mask when out of room to help prevent spread to others in household.  Sanitize high touch surfaces with lysol/chlorox/bleach spray or wipes daily as viruses are known to live on surfaces from 24 hours to days.  Patient would like covid antiviral Rx sent to [facility].  She is concerned  about infection settling in her chest.  Discussed viral illness not improved with antibiotics but will mail her prednisone taper to start in case she has wheezing not improved with symbicort/albuterol use.  Patient with asthma and frequent exacerbations. She  does not have nebulizer at home.  Patient has OTC cough and cold medication at home for cough/cold use.  Discussed use flonase nasal 46mcg 1 spray each nostril BID prn rhinitis/congestion.  Dayquil and nyquil per manufacturer instructions.  Patient has tried tessalon pearles in the past and not helpful does not want Rx.  Patient has albuterol inhaler at home for prn use protracting cough/wheezing/chest tightness.  Hasn't used yet but reports occasional cough no wheezing.  Hasn't used symbicort yet today either as fell back asleep and just woke up 30 minutes ago still groggy with bad headache.  Discussed albuterol use with patient 1-2 puffs po q4-6 hours prn.   May use honey 1 tablespoon every 4 hours is a natural cough suppressant but caution due to her diabetes.  Avoid dehydration and drink water to keep urine pale yellow clear and voiding every 2-4 hours while awake. Take naps prn.  Pace her activities.  Eat regular snacks or meals as body needs energy to fight infection.   Patient alert and oriented x3, spoke full sentences without difficulty.  Some nasal congestion noted.  No audible cough or throat clearing during 10 minute telephone call.  Discussed with patient she can contact me at  [phone] between [phone] if questions or concerns Remind patient clinic closed today but RN Hildred Alamin at [id number] M, Tu, Th, F.  Pt verbalized understanding and agreement with plan of care. No further questions/concerns at this time. Pt reminded to contact clinic with any changes in symptoms or questions/concerns. HR notified patient to quarantine through Day 5 10/10 and estimated RTW 10/11 with strict mask wear through Day 10 10/15 and no eating in employee lunch room.   Contacted [employer], production and they do not have availability of molnupiravir and unable to order at this time.  Patient notified via telephone and asked Rx be sent to [facility] as alternative option or [facility] third  option.  Paitent notified if I do not call her back Rx was sent to [facility] [place].  Patient verbalized understanding information/instructions and had no further questions at this time.

## 2021-04-15 MED ORDER — PREDNISONE 10 MG (21) PO TBPK: ORAL_TABLET | ORAL | 0 refills | Status: DC

## 2021-04-15 NOTE — Telephone Encounter (Signed)
Pt called clinic reporting HA, nasal congestion have improved but feels it is moving to her chest still "but not as bad as Wednesday when I talked to [place]." Voice hoarse today. Sts she has also started having diarrhea. Advised Imodium if no fever over 100.4.  spO2 monitor mailed to pt today. Prednisone Rx sent to external pharmacy so pt can pick up and start today. Continuing OTCs, Rx meds including symbicort and molnupiravir. Plan for weekend f/u ahead of anticipated RTW Monday 10/10.

## 2021-04-15 NOTE — Telephone Encounter (Signed)
RN Hildred Alamin contacted me via telephone to discuss patient symptoms.  Prednisone 10mg  taper electronic Rx sent to patient pharmacy of choice.  Loaner sp02 monitor mailed to patient home address by RN Hildred Alamin today.  Agreed with plan of care and will follow up this weekend via telephone with patient.

## 2021-04-16 NOTE — Telephone Encounter (Signed)
Patient contacted via telephone reported diarrhea resolved.  Head congestion/headache worsened today.  Started prednisone taper yesterday not feeling congested in chest today or needing albuterol inhaler prn today.  Using her nasal saline and flonase.  Ate soup today and hydrating.  Duration of call 3 minutes.  Patient with nasal congestion no audible throat clearing or coughing during call.  Will follow up with patient again tomorrow as safety officer notified patient she was to stay home until 04/19/21.  Patient still taking naps/fatigued.  Discussed hydrating orally, eating regular meals and pacing activities.  Patient A&Ox3 spoke full sentences without difficulty.  HR notified re-evaluation tomorrow.  Patient verbalized understanding information/instructions, agreed with plan of care and had no further questions at this time.

## 2021-04-17 NOTE — Telephone Encounter (Signed)
Patient with cough/congestion today and unable to breath through nose.  Worried dust/wearing mask at work will only make congestion/breathing worse.  Still taking prednisone.  Thinks it has helped as she hasn't required albuterol prn.  Just got out of shower and applied breath right nose strips to see if that will help her sleep tonight.  Frequent mild nonproductive cough and nasal congestion audible during 3 minute telephone call  Discussed re-evaluation tomorrow by RN Hildred Alamin.  Discussed ensure she is hydrating, using nasal saline, flonase and taking prednisone in am.  Patient verbalized understanding information/instructions, agreed with plan of care and had no further questions at this time.  HR notified symptoms worsening re-evaluation 04/18/21 Day 5 from positive test date/day 6 from symptom start date

## 2021-04-18 ENCOUNTER — Telehealth: Payer: Self-pay

## 2021-04-18 NOTE — Telephone Encounter (Signed)
Please advise as the pt is asking for a medication refill of her escitalopram (LEXAPRO) 10 MG tablet  LOV:01/12/2021

## 2021-04-18 NOTE — Telephone Encounter (Signed)
Last I saw her she was taking lexapro 20 mg daily can you confirm current dosing with her?

## 2021-04-18 NOTE — Telephone Encounter (Signed)
Spoke with pt by phone. She reports feeling much better today. Congestion is still worst sx but much improved. Voice no longer hoarse. Continuing meds. Feels she is able to RTW tomorrow 10/11. HR made aware. Pt to come to clinic around 0900 for spot O2 sat check since no loaner pulse oximeters were available to send to pt for weekend check.

## 2021-04-19 MED ORDER — ESCITALOPRAM OXALATE 20 MG PO TABS: 20.0000 mg | ORAL_TABLET | Freq: Every day | ORAL | 3 refills | Status: DC

## 2021-04-19 NOTE — Telephone Encounter (Signed)
RN Hildred Alamin contacted me via telephone to notify me of patient current symptoms and that loaner sp02 was not available for mailing to patient last week.  Patient planning to be onsite tomorrow and will come to clinic for lung sounds and sp02 check.  Reviewed RN Hildred Alamin note and agreed with plan of care.

## 2021-04-19 NOTE — Addendum Note (Signed)
Addended by: Pricilla Holm A on: 04/19/2021 04:35 PM   Modules accepted: Orders

## 2021-04-19 NOTE — Telephone Encounter (Signed)
Patient seen in clinic today with RN Hildred Alamin.  Feeling better no cough while in clinic.  Spoke full sentences without difficulty respirations even and unlabored gait sure and steady.  VSS BBS CTA.  Wearing mask.  Patient notified loaner sp02 monitor not available at this time and if changes can come to clinic for re-evaluation. Continue plan of care as previously discussed.   Patient verbalized understanding information/instructions, agreed with plan of care and had no further questions at this time.

## 2021-04-19 NOTE — Telephone Encounter (Signed)
I was able to speak with the pt and she has stated "Yes, she is taking the Lexapro at 20mg  and is needing a rx refill for that dosage."

## 2021-04-19 NOTE — Telephone Encounter (Signed)
Pt in to clinic for spot O2 and lung sound check. O2 sat 98%, p110. Had walked down from packing area.

## 2021-05-01 NOTE — Telephone Encounter (Signed)
Patient reported URI symptoms have resolved.  Still some fatigue.  Had an asthma flare up earlier this week due to perfume at work on [employer] now.  Spouse feeling well also.  Patient denied questions or concerns.  Spoke full sentences without difficulty.  No audible cough/congestion/throat clearing during 2 minute telephone call.  Post day 10 check in encounter closed.

## 2021-05-17 ENCOUNTER — Other Ambulatory Visit: Payer: Self-pay

## 2021-05-17 ENCOUNTER — Encounter: Payer: Self-pay | Admitting: Registered Nurse

## 2021-05-17 ENCOUNTER — Ambulatory Visit: Payer: Self-pay | Admitting: Registered Nurse

## 2021-05-17 VITALS — BP 151/89 | HR 86 | Temp 98.1°F

## 2021-05-17 DIAGNOSIS — H6983 Other specified disorders of Eustachian tube, bilateral: Secondary | ICD-10-CM

## 2021-05-17 DIAGNOSIS — J3089 Other allergic rhinitis: Secondary | ICD-10-CM

## 2021-05-17 DIAGNOSIS — R519 Headache, unspecified: Secondary | ICD-10-CM

## 2021-05-17 NOTE — Progress Notes (Signed)
Subjective:    Patient ID: Kelly Reed, female    DOB: 22-Dec-1965, 55 y.o.   MRN: [id number]  55y/o Caucasian established married female pt c/o dizziness upon standing and headache that started suddenly after returning from break around 0845 today. Would like blood pressure checked.  Headache frontal and at temples. When sitting, dizziness resolves. Skin warm to touch. She reports her work area is hot. Urine pale yellow and voiding regularly.  Denied any new or worsening sinus congestion/rhinitis.  Typical allergies.  Denied asthma flare.  Ate breakfast this morning.  Not allowed to put fan in her work area due to [employer].  Patient taking her allergy medication.  Denied light sensitivity/weakness extremities/difficulty swallowing/speech/worst headache of life/n/v/d.     Review of Systems  Constitutional:  Positive for diaphoresis. Negative for activity change, appetite change, chills, fatigue and fever.  HENT:  Negative for congestion, dental problem, ear discharge, ear pain, facial swelling, hearing loss, mouth sores, nosebleeds, postnasal drip, rhinorrhea, sinus pressure, sinus pain, sneezing and trouble swallowing.   Eyes:  Negative for photophobia and visual disturbance.  Respiratory:  Negative for cough, choking, chest tightness, shortness of breath, wheezing and stridor.   Cardiovascular:  Negative for chest pain.  Gastrointestinal:  Negative for diarrhea, nausea and vomiting.  Endocrine: Positive for heat intolerance. Negative for cold intolerance.  Genitourinary:  Negative for difficulty urinating.  Musculoskeletal:  Negative for neck pain and neck stiffness.  Skin:  Negative for rash.  Allergic/Immunologic: Positive for environmental allergies.  Neurological:  Positive for dizziness, light-headedness and headaches. Negative for tremors, seizures, syncope, facial asymmetry, speech difficulty, weakness and numbness.  Hematological:  Negative for adenopathy. Does not  bruise/bleed easily.  Psychiatric/Behavioral:  Negative for agitation, confusion and sleep disturbance.       Objective:   Physical Exam Vitals and nursing note reviewed.  Constitutional:      General: She is awake. She is not in acute distress.    Appearance: Normal appearance. She is well-developed, well-groomed and overweight. She is not ill-appearing, toxic-appearing or diaphoretic.  HENT:     Head: Normocephalic and atraumatic. No right periorbital erythema or left periorbital erythema.     Jaw: There is normal jaw occlusion. No trismus, tenderness, swelling, pain on movement or malocclusion.     Salivary Glands: Right salivary gland is not diffusely enlarged. Left salivary gland is not diffusely enlarged.     Right Ear: Hearing, ear canal and external ear normal. No decreased hearing noted. A middle ear effusion is present. There is no impacted cerumen.     Left Ear: Hearing, ear canal and external ear normal. No decreased hearing noted. A middle ear effusion is present. There is no impacted cerumen.     Nose: No nasal deformity, septal deviation, laceration, nasal tenderness, mucosal edema, congestion or rhinorrhea.     Right Turbinates: Not enlarged, swollen or pale.     Left Turbinates: Not enlarged, swollen or pale.     Right Sinus: No maxillary sinus tenderness or frontal sinus tenderness.     Left Sinus: No maxillary sinus tenderness or frontal sinus tenderness.     Mouth/Throat:     Lips: Pink. No lesions.     Mouth: Mucous membranes are moist. Mucous membranes are not pale, not dry and not cyanotic. No lacerations, oral lesions or angioedema.     Dentition: No dental abscesses or gum lesions.     Tongue: No lesions. Tongue does not deviate from midline.  Palate: No mass and lesions.     Pharynx: Uvula midline. No pharyngeal swelling, oropharyngeal exudate, posterior oropharyngeal erythema or uvula swelling.     Tonsils: No tonsillar exudate or tonsillar abscesses. 0 on  the right. 0 on the left.     Comments: Bilateral allergic shiners; bilateral air fluid level clear TMs Eyes:     General: Lids are normal. Vision grossly intact. Gaze aligned appropriately. Allergic shiner present. No visual field deficit or scleral icterus.       Right eye: No foreign body, discharge or hordeolum.        Left eye: No foreign body, discharge or hordeolum.     Extraocular Movements: Extraocular movements intact.     Right eye: Normal extraocular motion and no nystagmus.     Left eye: Normal extraocular motion and no nystagmus.     Conjunctiva/sclera: Conjunctivae normal.     Right eye: Right conjunctiva is not injected. No chemosis, exudate or hemorrhage.    Left eye: Left conjunctiva is not injected. No chemosis, exudate or hemorrhage.    Pupils: Pupils are equal, round, and reactive to light. Pupils are equal.     Right eye: Pupil is round and reactive.     Left eye: Pupil is round and reactive.  Neck:     Thyroid: No thyromegaly.     Trachea: Trachea and phonation normal. No tracheal tenderness or tracheal deviation.  Cardiovascular:     Rate and Rhythm: Normal rate and regular rhythm.     Pulses: Normal pulses.          Radial pulses are 2+ on the right side and 2+ on the left side.  Pulmonary:     Effort: Pulmonary effort is normal. No accessory muscle usage or respiratory distress.     Breath sounds: Normal breath sounds. No stridor. No decreased breath sounds, wheezing, rhonchi or rales.  Chest:     Chest wall: No tenderness.  Abdominal:     General: There is no distension.     Palpations: Abdomen is soft.  Musculoskeletal:        General: No swelling, tenderness or deformity. Normal range of motion.     Right shoulder: Normal.     Left shoulder: Normal.     Right hand: Normal.     Left hand: Normal.     Cervical back: Normal range of motion and neck supple. No edema, erythema, signs of trauma, rigidity, tenderness or crepitus. No pain with movement. Normal  range of motion.     Right hip: Normal.     Left hip: Normal.     Right knee: Normal.     Left knee: Normal.     Right lower leg: No edema.     Left lower leg: No edema.  Lymphadenopathy:     Head:     Right side of head: No submental, submandibular, tonsillar, preauricular, posterior auricular or occipital adenopathy.     Left side of head: No submental, submandibular, tonsillar, preauricular, posterior auricular or occipital adenopathy.     Cervical: No cervical adenopathy.     Right cervical: No superficial, deep or posterior cervical adenopathy.    Left cervical: No superficial, deep or posterior cervical adenopathy.  Skin:    General: Skin is warm and moist.     Capillary Refill: Capillary refill takes less than 2 seconds.     Coloration: Skin is not ashen, cyanotic, jaundiced, mottled, pale or sallow.     Findings: No abrasion,  abscess, acne, bruising, burn, ecchymosis, erythema, signs of injury, laceration, lesion, petechiae, rash or wound.     Nails: There is no clubbing.     Comments: Patient clothing/skin slightly damp to touch but no droplets of moisture noted on skin or sweat stains on clothes  Neurological:     General: No focal deficit present.     Mental Status: She is alert and oriented to person, place, and time. Mental status is at baseline. She is not disoriented.     GCS: GCS eye subscore is 4. GCS verbal subscore is 5. GCS motor subscore is 6.     Cranial Nerves: No cranial nerve deficit, dysarthria or facial asymmetry.     Sensory: No sensory deficit.     Motor: Motor function is intact. No weakness, tremor, atrophy, abnormal muscle tone or seizure activity.     Coordination: Coordination is intact. Coordination normal.     Gait: Gait is intact. Gait normal.     Comments: Bilateral hand grasp equal 5/5; gait sure and stead in clinic; in/out of chair without difficulty  Psychiatric:        Attention and Perception: Attention and perception normal.        Mood and  Affect: Mood and affect normal.        Speech: Speech normal.        Behavior: Behavior normal. Behavior is cooperative.        Thought Content: Thought content normal.        Cognition and Memory: Cognition and memory normal.        Judgment: Judgment normal.          Assessment & Plan:  A-seasonal allergic rhinitis, eustachian tube dysfunction bilateral, acute nonintractable headache unspecified  P-Continue her antihistamine, nasal sprays steroid and saline.  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   Patient may use normal saline nasal spray 2 sprays each nostril q2h wa as needed. flonase 78mcg 1 spray each nostril BID  Patient denied personal or family history of ENT cancer.  OTC antihistamine of choice po daily.  Avoid triggers if possible.    Call or return to clinic as needed if these symptoms worsen or fail to improve as anticipated.    Patient verbalized understanding of instructions, agreed with plan of care and had no further questions at this time.  P2:  Avoidance and hand washing.   Discussed otolith function can be affected when fluid in the middle ears. Slow position changes when lying/sitting to standing recommended. No evidence of invasive bacterial infection, non toxic and well hydrated.  I do not see where any further testing or imaging is necessary at this time.   I will suggest supportive care, rest, good hygiene and encourage the patient to take adequate fluids.  The patient is to return to clinic or EMERGENCY ROOM if symptoms worsen or change significantly e.g. ear pain, fever, purulent discharge from ears or bleeding.  Discussed with patient post nasal drip irritates throat/causes swelling blocks eustachian tubes from draining and fluid fills up middle ear.  Bacteria/viruses can grow in fluid and with moving head tube compressed and increases pressure in tube/ear worsening pain.  Studies show  will take 30 days for fluid to resolve after post nasal drip controlled with nasal steroid/antihistamine. Antibiotics and steroids do not speed up fluid removal.  Patient verbalized agreement and understanding of treatment plan and had no  further questions at this time.   Discussed headache could be from mild dehydration/sweating, hydrate.  Patient reports dizziness typically occurs with position change and that would support mild dehdyration also along with headache.  Discuss opening outside door to allow cooler air into to workcenter from loading bay with supervisor as more individuals working in packing/workstations added this week body heat may be raising temperature or discuss temperature setting in warehouse with supervisor if all coworkers agreeing hot.  Visited workcenter and area is warm compared to other areas of warehouse.  Hydrate with noncaffinated beverage.  Tylenol 1000mg  po every 6 hours prn pain given 4 UD tylenol 325mg  from clinic stock.  Blood sugar checked and normal POCT by [employer].  Blood pressure slightly lower than previous clinic visit (at baseline).  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  Daylight Savings Time occurred on 11/6 could also be a factor along with cold front moved into area overnight.  If worst headache of life or light sensitivity/vision changes seek follow up re-evaluation same day.  Exitcare handouts on benign positional vertigo, dizziness and headache.  Patient verbalized understanding information/instructions, agreed with plan of care and had no further questions at this time.

## 2021-05-17 NOTE — Patient Instructions (Signed)
General Headache Without Cause A headache is pain or discomfort felt around the head or neck area. There are many causes and types of headaches. A few common types include: Tension headaches. Migraine headaches. Cluster headaches. Chronic daily headaches. Sometimes, the specific cause of a headache may not be found. Follow these instructions at home: Watch your condition for any changes. Let your health care provider know about them. Take these steps to help with your condition: Managing pain   Take over-the-counter and prescription medicines only as told by your health care provider. Treatment may include medicines for pain that are taken by mouth or applied to the skin. Lie down in a dark, quiet room when you have a headache. Keep lights dim if bright lights bother you or make your headaches worse. If directed, put ice on your head and neck area: Put ice in a plastic bag. Place a towel between your skin and the bag. Leave the ice on for 20 minutes, 2-3 times per day. Remove the ice if your skin turns bright red. This is very important. If you cannot feel pain, heat, or cold, you have a greater risk of damage to the area. If directed, apply heat to the affected area. Use the heat source that your health care provider recommends, such as a moist heat pack or a heating pad. Place a towel between your skin and the heat source. Leave the heat on for 20-30 minutes. Remove the heat if your skin turns bright red. This is especially important if you are unable to feel pain, heat, or cold. You have a greater risk of getting burned. Eating and drinking Eat meals on a regular schedule. If you drink alcohol: Limit how much you have to: 0-1 drink a day for women who are not pregnant. 0-2 drinks a day for men. Know how much alcohol is in a drink. In the [place] one drink equals one 12 oz bottle of beer (355 mL), one 5 oz glass of wine (148 mL), or one 1 oz glass of hard liquor (44 mL). Stop drinking  caffeine, or decrease the amount of caffeine you drink. Drink enough fluid to keep your urine pale yellow. General instructions  Keep a headache journal to help find out what may trigger your headaches. For example, write down: What you eat and drink. How much sleep you get. Any change to your diet or medicines. Try massage or other relaxation techniques. Limit stress. Sit up straight, and do not tense your muscles. Do not use any products that contain nicotine or tobacco. These products include cigarettes, chewing tobacco, and vaping devices, such as e-cigarettes. If you need help quitting, ask your health care provider. Exercise regularly as told by your health care provider. Sleep on a regular schedule. Get 7-9 hours of sleep each night, or the amount recommended by your health care provider. Keep all follow-up visits. This is important. Contact a health care provider if: Medicine does not help your symptoms. You have a headache that is different from your usual headache. You have nausea or you vomit. You have a fever. Get help right away if: Your headache: Becomes severe quickly. Gets worse after moderate to intense physical activity. You have any of these symptoms: Repeated vomiting. Pain or stiffness in your neck. Changes to your vision. Pain in an eye or ear. Problems with speech. Muscular weakness or loss of muscle control. Loss of balance or coordination. You feel faint or pass out. You have confusion. You have a seizure.  These symptoms may represent a serious problem that is an emergency. Do not wait to see if the symptoms will go away. Get medical help right away. Call your local emergency services (911 in the [place] Do not drive yourself to the hospital. Summary A headache is pain or discomfort felt around the head or neck area. There are many causes and types of headaches. In some cases, the cause may not be found. Keep a headache journal to help find out what may  trigger your headaches. Watch your condition for any changes. Let your health care provider know about them. Contact a health care provider if you have a headache that is different from the usual headache, or if your symptoms are not helped by medicine. Get help right away if your headache becomes severe, you vomit, you have a loss of vision, you lose your balance, or you have a seizure. This information is not intended to replace advice given to you by your health care provider. Make sure you discuss any questions you have with your health care provider. Document Revised: 11/24/2020 Document Reviewed: 11/24/2020 Elsevier Patient Education  [place]. Dizziness Dizziness is a common problem. It is a feeling of unsteadiness or light-headedness. You may feel like you are about to faint. Dizziness can lead to injury if you stumble or fall. Anyone can become dizzy, but dizziness is more common in older adults. This condition can be caused by a number of things, including medicines, dehydration, or illness. Follow these instructions at home: Eating and drinking  Drink enough fluid to keep your urine pale yellow. This helps to keep you from becoming dehydrated. Try to drink more clear fluids, such as water. Do not drink alcohol. Limit your caffeine intake if told to do so by your health care provider. Check ingredients and nutrition facts to see if a food or beverage contains caffeine. Limit your salt (sodium) intake if told to do so by your health care provider. Check ingredients and nutrition facts to see if a food or beverage contains sodium. Activity  Avoid making quick movements. Rise slowly from chairs and steady yourself until you feel okay. In the morning, first sit up on the side of the bed. When you feel okay, stand slowly while you hold onto something until you know that your balance is good. If you need to stand in one place for a long time, move your legs often. Tighten and relax  the muscles in your legs while you are standing. Do not drive or use machinery if you feel dizzy. Avoid bending down if you feel dizzy. Place items in your home so that they are easy for you to reach without leaning over. Lifestyle Do not use any products that contain nicotine or tobacco. These products include cigarettes, chewing tobacco, and vaping devices, such as e-cigarettes. If you need help quitting, ask your health care provider. Try to reduce your stress level by using methods such as yoga or meditation. Talk with your health care provider if you need help to manage your stress. General instructions Watch your dizziness for any changes. Take over-the-counter and prescription medicines only as told by your health care provider. Talk with your health care provider if you think that your dizziness is caused by a medicine that you are taking. Tell a friend or a family member that you are feeling dizzy. If he or she notices any changes in your behavior, have this person call your health care provider. Keep all follow-up visits.  This is important. Contact a health care provider if: Your dizziness does not go away or you have new symptoms. Your dizziness or light-headedness gets worse. You feel nauseous. You have reduced hearing. You have a fever. You have neck pain or a stiff neck. Your dizziness leads to an injury or a fall. Get help right away if: You vomit or have diarrhea and are unable to eat or drink anything. You have problems talking, walking, swallowing, or using your arms, hands, or legs. You feel generally weak. You have any bleeding. You are not thinking clearly or you have trouble forming sentences. It may take a friend or family member to notice this. You have chest pain, abdominal pain, shortness of breath, or sweating. Your vision changes or you develop a severe headache. These symptoms may represent a serious problem that is an emergency. Do not wait to see if the  symptoms will go away. Get medical help right away. Call your local emergency services (911 in the [place] Do not drive yourself to the hospital. Summary Dizziness is a feeling of unsteadiness or light-headedness. This condition can be caused by a number of things, including medicines, dehydration, or illness. Anyone can become dizzy, but dizziness is more common in older adults. Drink enough fluid to keep your urine pale yellow. Do not drink alcohol. Avoid making quick movements if you feel dizzy. Monitor your dizziness for any changes. This information is not intended to replace advice given to you by your health care provider. Make sure you discuss any questions you have with your health care provider. Document Revised: 05/31/2020 Document Reviewed: 05/31/2020 Elsevier Patient Education  2022 [place]. Benign Positional Vertigo Vertigo is the feeling that you or your surroundings are moving when they are not. Benign positional vertigo is the most common form of vertigo. This is usually a harmless condition (benign). This condition is positional. This means that symptoms are triggered by certain movements and positions. This condition can be dangerous if it occurs while you are doing something that could cause harm to yourself or others. This includes activities such as driving or operating machinery. What are the causes? The inner ear has fluid-filled canals that help your brain sense movement and balance. When the fluid moves, the brain receives messages about your body's position. With benign positional vertigo, calcium crystals in the inner ear break free and disturb the inner ear area. This causes your brain to receive confusing messages about your body's position. What increases the risk? You are more likely to develop this condition if: You are a woman. You are 48 years of age or older. You have recently had a head injury. You have an inner ear disease. What are the signs or  symptoms? Symptoms of this condition usually happen when you move your head or your eyes in different directions. Symptoms may start suddenly and usually last for less than a minute. They include: Loss of balance and falling. Feeling like you are spinning or moving. Feeling like your surroundings are spinning or moving. Nausea and vomiting. Blurred vision. Dizziness. Involuntary eye movement (nystagmus). Symptoms can be mild and cause only minor problems, or they can be severe and interfere with daily life. Episodes of benign positional vertigo may return (recur) over time. Symptoms may also improve over time. How is this diagnosed? This condition may be diagnosed based on: Your medical history. A physical exam of the head, neck, and ears. Positional tests to check for or stimulate vertigo. You may be asked  to turn your head and change positions, such as going from sitting to lying down. A health care provider will watch for symptoms of vertigo. You may be referred to a health care provider who specializes in ear, nose, and throat problems (ENT or otolaryngologist) or a provider who specializes in disorders of the nervous system (neurologist). How is this treated? This condition may be treated in a session in which your health care provider moves your head in specific positions to help the displaced crystals in your inner ear move. Treatment for this condition may take several sessions. Surgery may be needed in severe cases, but this is rare. In some cases, benign positional vertigo may resolve on its own in 2-4 weeks. Follow these instructions at home: Safety Move slowly. Avoid sudden body or head movements or certain positions, as told by your health care provider. Avoid driving or operating machinery until your health care provider says it is safe. Avoid doing any tasks that would be dangerous to you or others if vertigo occurs. If you have trouble walking or keeping your balance, try  using a cane for stability. If you feel dizzy or unstable, sit down right away. Return to your normal activities as told by your health care provider. Ask your health care provider what activities are safe for you. General instructions Take over-the-counter and prescription medicines only as told by your health care provider. Drink enough fluid to keep your urine pale yellow. Keep all follow-up visits. This is important. Contact a health care provider if: You have a fever. Your condition gets worse or you develop new symptoms. Your family or friends notice any behavioral changes. You have nausea or vomiting that gets worse. You have numbness or a prickling and tingling sensation. Get help right away if you: Have difficulty speaking or moving. Are always dizzy or faint. Develop severe headaches. Have weakness in your legs or arms. Have changes in your hearing or vision. Develop a stiff neck. Develop sensitivity to light. These symptoms may represent a serious problem that is an emergency. Do not wait to see if the symptoms will go away. Get medical help right away. Call your local emergency services (911 in the [place] Do not drive yourself to the hospital. Summary Vertigo is the feeling that you or your surroundings are moving when they are not. Benign positional vertigo is the most common form of vertigo. This condition is caused by calcium crystals in the inner ear that become displaced. This causes a disturbance in an area of the inner ear that helps your brain sense movement and balance. Symptoms include loss of balance and falling, feeling that you or your surroundings are moving, nausea and vomiting, and blurred vision. This condition can be diagnosed based on symptoms, a physical exam, and positional tests. Follow safety instructions as told by your health care provider and keep all follow-up visits. This is important. This information is not intended to replace advice given to you  by your health care provider. Make sure you discuss any questions you have with your health care provider. Document Revised: 05/26/2020 Document Reviewed: 05/26/2020 Elsevier Patient Education  2022 [employer].

## 2021-05-20 ENCOUNTER — Ambulatory Visit: Payer: No Typology Code available for payment source | Admitting: Internal Medicine

## 2021-05-23 ENCOUNTER — Telehealth: Payer: Self-pay | Admitting: *Deleted

## 2021-05-23 DIAGNOSIS — R509 Fever, unspecified: Secondary | ICD-10-CM

## 2021-05-23 NOTE — Telephone Encounter (Signed)
Reviewed RN Hildred Alamin note agreed with plan of care; covid test negative consider flu antivirals will follow up with patient 05/24/21

## 2021-05-23 NOTE — Telephone Encounter (Signed)
Pt called out of work today 11/14 citing fever. Called pt to discuss. No answer. LVMRCB.

## 2021-05-23 NOTE — Telephone Encounter (Signed)
Pt called back and LVM advising home covid test taken just now was negative.

## 2021-05-23 NOTE — Telephone Encounter (Signed)
Pt returned call. Fever 101 at 0300 this morning. Endorses sore throat and fatigue.Denies other sx. Took tylenol at noon today. Temp WNL now, post-Tylenol. Has covid test at home. Asked her to test today. If negative, will retest in 48 hrs. Monitor temp. Must be fever free without antipyretics 24 hr prior to RTW. Will f/u tomorrow 11/15 for sx check and covid test result. Pt agreeable and denies further questions or concerns.

## 2021-05-24 NOTE — Telephone Encounter (Signed)
RN Hildred Alamin discussed patient symptoms with me in clinic while patient on phone today.  Repeat covid test tonight bedtime or prior to work in am and if negative cleared to return onsite tomorrow.  Reviewed RN Hildred Alamin note agreed with plan of care.

## 2021-05-24 NOTE — Telephone Encounter (Signed)
Spoke with pt by phone. She reports no fever since original one 0300 Monday morning. Sore throat now just slightly scratchy. Sinus pressure back to her baseline. Denies fatigue, body aches, chills. Discussed with NP Otila Kluver. Pt to repeat covid test late this evening prior to RTW tomorrow am. As long as negative, cleared to RTW 11/16. Pt agreeable. Denies questions or concerns.

## 2021-05-26 NOTE — Telephone Encounter (Signed)
Patient seen in workcenter today feeling well denied concerns.  A&Ox3 spoke full sentences without difficulty skin warm dry and pink respirations even and unlabored RA no cough/throat clearing/nasal congestion.  Encounter closed.

## 2021-06-10 ENCOUNTER — Encounter: Payer: Self-pay | Admitting: Internal Medicine

## 2021-06-10 ENCOUNTER — Ambulatory Visit: Payer: No Typology Code available for payment source | Admitting: Internal Medicine

## 2021-06-10 ENCOUNTER — Other Ambulatory Visit: Payer: Self-pay

## 2021-06-10 VITALS — BP 128/82 | HR 88 | Resp 18 | Ht 71.0 in | Wt 211.6 lb

## 2021-06-10 DIAGNOSIS — Z23 Encounter for immunization: Secondary | ICD-10-CM

## 2021-06-10 DIAGNOSIS — F419 Anxiety disorder, unspecified: Secondary | ICD-10-CM

## 2021-06-10 DIAGNOSIS — J4540 Moderate persistent asthma, uncomplicated: Secondary | ICD-10-CM

## 2021-06-10 DIAGNOSIS — E1165 Type 2 diabetes mellitus with hyperglycemia: Secondary | ICD-10-CM

## 2021-06-10 DIAGNOSIS — I10 Essential (primary) hypertension: Secondary | ICD-10-CM

## 2021-06-10 DIAGNOSIS — F32A Depression, unspecified: Secondary | ICD-10-CM

## 2021-06-10 DIAGNOSIS — E118 Type 2 diabetes mellitus with unspecified complications: Secondary | ICD-10-CM

## 2021-06-10 LAB — POCT GLYCOSYLATED HEMOGLOBIN (HGB A1C): Hemoglobin A1C: 7.2 % — AB (ref 4.0–5.6)

## 2021-06-10 NOTE — Assessment & Plan Note (Signed)
Has been running mildly high at home. At goal today. She feels stress is playing a role. She is taking HCTZ 25 mg daily. If needed another agent would pick ACE-I or ARB.

## 2021-06-10 NOTE — Assessment & Plan Note (Signed)
POC HgA1c done today at office and is improved to 7.2. She has made dietary changes. Will continue trulicity and metformin at current dosing. Follow up 3-6 months.

## 2021-06-10 NOTE — Patient Instructions (Addendum)
Your HgA1c is 7.2 today which is better so keep up the good work.  We will get the FMLA done for your work.

## 2021-06-10 NOTE — Assessment & Plan Note (Signed)
She is going to continue taking lexapro 20 mg daily and is having more stress at work. She will monitor and let us know if change is needed.

## 2021-06-10 NOTE — Progress Notes (Signed)
   Subjective:   Patient ID: Kelly Reed, female    DOB: 19-Jun-1966, 55 y.o.   MRN: [id number]  HPI The patient is a 55 YO female coming in for follow up medical conditions.   Review of Systems  Constitutional: Negative.   HENT: Negative.    Eyes: Negative.   Respiratory:  Negative for cough, chest tightness and shortness of breath.   Cardiovascular:  Negative for chest pain, palpitations and leg swelling.  Gastrointestinal:  Negative for abdominal distention, abdominal pain, constipation, diarrhea, nausea and vomiting.  Musculoskeletal: Negative.   Skin: Negative.   Neurological: Negative.   Psychiatric/Behavioral: Negative.     Objective:  Physical Exam Constitutional:      Appearance: She is well-developed.  HENT:     Head: Normocephalic and atraumatic.  Cardiovascular:     Rate and Rhythm: Normal rate and regular rhythm.  Pulmonary:     Effort: Pulmonary effort is normal. No respiratory distress.     Breath sounds: Normal breath sounds. No wheezing or rales.  Abdominal:     General: Bowel sounds are normal. There is no distension.     Palpations: Abdomen is soft.     Tenderness: There is no abdominal tenderness. There is no rebound.  Musculoskeletal:     Cervical back: Normal range of motion.  Skin:    General: Skin is warm and dry.  Neurological:     Mental Status: She is alert and oriented to person, place, and time.     Coordination: Coordination normal.    Vitals:   06/10/21 0816  BP: 128/82  Pulse: 88  Resp: 18  SpO2: 98%  Weight: 211 lb 9.6 oz (96 kg)  Height: 5\' 11"  (1.803 m)    This visit occurred during the SARS-CoV-2 public health emergency.  Safety protocols were in place, including screening questions prior to the visit, additional usage of staff PPE, and extensive cleaning of exam room while observing appropriate contact time as indicated for disinfecting solutions.   Assessment & Plan:  Flu shot given at visit

## 2021-06-10 NOTE — Assessment & Plan Note (Signed)
Needs FMLA forms filled out and will drop those off for completion. She has had several steroid injections for this since our last visit.

## 2021-06-13 ENCOUNTER — Telehealth: Payer: Self-pay | Admitting: *Deleted

## 2021-06-13 ENCOUNTER — Encounter: Payer: Self-pay | Admitting: *Deleted

## 2021-06-13 DIAGNOSIS — J069 Acute upper respiratory infection, unspecified: Secondary | ICD-10-CM

## 2021-06-13 NOTE — Telephone Encounter (Signed)
Pt called out of work today citing cough and fever.  Spoke with pt by phone. Sx started last night 12/4 Tmax 101. Cough was productive this morning but has changed to dry now. Covid test at home this morning was negative.  She is taking Symbicort, Flonase, Xyzal, and saline spray at home. Has albuterol inh if needed but has not used or felt need for.  Advised pt clinic staff will f/u with her tomorrow for sx check. Likely repeat testing in 48hr. Will be out of work at least tomorrow due to fever today. Pt agreeable. Denies questions or concerns.

## 2021-06-13 NOTE — Telephone Encounter (Signed)
Reviewed RN Hildred Alamin note agreed with plan of care.  Patient saw [facility] 06/10/21 Hgab1c improved 7.2 exam normal BBS CTA VSS sp02 98%RA  Will follow up with patient via telephone 06/14/21

## 2021-06-14 MED ORDER — OSELTAMIVIR PHOSPHATE 75 MG PO CAPS: 75.0000 mg | ORAL_CAPSULE | Freq: Two times a day (BID) | ORAL | 0 refills | Status: DC

## 2021-06-14 NOTE — Telephone Encounter (Signed)
Spoke with pt by phone. She reports "it went to my head." Cough improving but now with sneezing and nasal congestion. No fever but had chills this morning. HA still. Fatigue and body aches new today. Started Dayquil, Nyquil now.  Discussed use of Tamiflu and pt agreeable. Confirmed with her preferred pharmacy that they do have generic tamiflu tablets in stock. Rx sent.  Denies other needs or concerns at this time. Plan to repeat covid testing tomorrow.

## 2021-06-15 MED ORDER — SALINE SPRAY 0.65 % NA SOLN
2.0000 | NASAL | 0 refills | Status: DC
Start: 2021-06-15 — End: 2021-08-02

## 2021-06-15 MED ORDER — FLUTICASONE PROPIONATE 50 MCG/ACT NA SUSP
1.0000 | Freq: Two times a day (BID) | NASAL | 6 refills | Status: DC
Start: 2021-06-15 — End: 2021-08-02

## 2021-06-15 NOTE — Telephone Encounter (Addendum)
Patient contacted NP via telephone to notify covid test negative today.  Started tamiflu last night.   "I guess I have the flu instead of covid" Using singulair, symbicort, nasal saline, flonase nasal, and xyzal.  Took some OTC cough medicine right now as planning to take a nap.  Patient not feeling well enough to work.  Work asking if she can return on Saturday 06/18/21.  Discussed with patient unsure at this time will re-evaluate again tomorrow since fever today.  Symptoms need to be resolving and fever free and no n/v/d x 24 hours.  Patient denied n/v/d in the previous 24 hours.  Patient with congestion, cough, body aches, headache, fever and chills today.  It felt like my fever broke mid-morning today but fatigued and coughing a lot.  Has not used albuterol inhaler today.  Does not feel like she needs oral steroids at this time.  "I just have a lot of mucous and cough/sore throat this afternoon."  Discussed with patient hydrate to avoid dehydration.  She is using humidifier with vicks vapor rub and that helps with cough also.  Trying to isolate in her room to prevent spread to family members.  Discussed with patient first 4-6 days of flu highest viral shedding.  Wear mask to protect others when out of her room.  Frequent handwashing.  Discussed tylenol or ibuprofen prn headache/sore throat along with hydrating.  Start albuterol inhaler if chest tightness/wheezing.  Discussed RN Hildred Alamin will contact her tomorrow to follow up/re-evaluate.  Patient with frequent nonproductive cough, congestion and throat clearing noted during 7 minute telephone call.  [employer] handout on influenza sent to patient my chart.  HR notified fever re-evaluate tomorrow not cleared to return onsite at this time.  Patient verbalized understanding information/instructions, agreed with plan of care and had no further questions at this time.

## 2021-06-16 NOTE — Telephone Encounter (Signed)
Spoke with pt by phone. She reports still feeling "rough." Fever still intermittent, Tmax in past 24hrs 101F. Sts sx same as yesterday, no change. Continuing all meds. Will re-evaluate tomorrow 12/9.

## 2021-06-16 NOTE — Telephone Encounter (Signed)
Reviewed RN Haley note agreed with plan of care. 

## 2021-06-17 NOTE — Telephone Encounter (Signed)
Sx check in. No answer. LVM. Weekend f/u. Will not plan on pt working tomorrow as work had requested of her based on yesterday's sx and pts' typical illness course with previous similar illnesses.

## 2021-06-19 NOTE — Telephone Encounter (Signed)
Patient returned call stated all symptoms except fatigue have resolved last fever Friday 100 F.  Today finally went outside the house and ran an errand.  Still resting up in preparation for being on her feet all day at work this week.  Denied cough/congestion/vomiting/diarrhea/chest tightness.  Patient A&Ox3 spoke full sentences without difficulty cleared to return onsite 06/20/21 without mask wear. HR team notified. No audible cough/wheeze/throat clearing/congestion during 2 minute telephone call.  Patient verbalized understanding information/instructions, agreed with plan of care and had no further questions at this time.

## 2021-06-20 NOTE — Telephone Encounter (Signed)
Per HR [employer] employee returned to work today as expected.

## 2021-06-29 NOTE — Telephone Encounter (Signed)
Patient seen in clinic Tuesday 06/28/2021.  A&Ox3 spoke full sentences without difficulty no audible cough/wheeze/congestion or throat clearing.  Skin warm dry and pink.  Respirations even and unlabored.  Gait sure and steady.  Day 10 06/22/2021  Patient denied questions or concerns.  Encounter closed

## 2021-07-01 ENCOUNTER — Telehealth: Payer: Self-pay | Admitting: *Deleted

## 2021-07-01 NOTE — Telephone Encounter (Signed)
Clinic notified pt left work early today with a cough. Spoke with pt by phone. She reports dry cough and voice raspy. No sore throat, congestion, sinus pressure. Feels her allergies are acting up and weather is contributing to it. Treating with her routine allergy meds and inhaler. Negative covid test since arriving home today 12/23. Will repeat in 48hrs. Requests f/u Monday 12/26 as she is already scheduled off that day and not to RTW until 12/27.

## 2021-07-03 ENCOUNTER — Encounter: Payer: Self-pay | Admitting: *Deleted

## 2021-07-03 NOTE — Telephone Encounter (Signed)
Reviewed RN Hildred Alamin note agreed with plan of care.  Telephone message left for patient 07/03/21 symptom follow up.

## 2021-07-04 NOTE — Telephone Encounter (Signed)
Spoke with pt by phone. She reports all sx have resolved. Repeat 48hr covid test yesterday was negative. RTW 12/27. Mask use if any coughing or sneezing recur. Denies needs or concerns.

## 2021-07-04 NOTE — Telephone Encounter (Signed)
Reviewed RN Hildred Alamin note patient RTW as expected all symptoms resolved and repeat covid test negative.  Encounter closed.

## 2021-07-05 ENCOUNTER — Other Ambulatory Visit: Payer: Self-pay

## 2021-07-05 ENCOUNTER — Encounter: Payer: Self-pay | Admitting: Registered Nurse

## 2021-07-05 ENCOUNTER — Ambulatory Visit: Payer: Self-pay | Admitting: Registered Nurse

## 2021-07-05 VITALS — BP 132/95 | HR 106 | Temp 98.7°F | Resp 16

## 2021-07-05 DIAGNOSIS — L500 Allergic urticaria: Secondary | ICD-10-CM

## 2021-07-05 DIAGNOSIS — J00 Acute nasopharyngitis [common cold]: Secondary | ICD-10-CM

## 2021-07-05 MED ORDER — DIPHENHYDRAMINE HCL 50 MG PO TABS: 50.0000 mg | ORAL_TABLET | Freq: Three times a day (TID) | ORAL | 0 refills | Status: DC | PRN

## 2021-07-05 MED ORDER — PREDNISONE 10 MG PO TABS: ORAL_TABLET | ORAL | 0 refills | Status: DC

## 2021-07-05 NOTE — Patient Instructions (Signed)
Hives Hives (urticaria) are itchy, red, swollen areas on the skin. Hives can appear on any part of the body. Hives often fade within 24 hours (acute hives). Sometimes, new hives appear after old ones fade and the cycle can continue for several days or weeks (chronic hives). Hives do not spread from person to person (are not contagious). Hives come from the body's reaction to something a person is allergic to (allergen), something that causes irritation, or various other triggers. When a person is exposed to a trigger, his or her body releases a chemical (histamine) that causes redness, itching, and swelling. Hives can appear right after exposure to a trigger or hours later. What are the causes? This condition may be caused by: Allergies to foods or ingredients. Insect bites or stings. Exposure to pollen or pets. Spending time in sunlight, heat, or cold (exposure). Exercise. Stress. You can also get hives from other medical conditions and treatments, such as: Viruses, including the common cold. Bacterial infections, such as urinary tract infections and strep throat. Certain medicines. Contact with latex or chemicals. Allergy shots. Blood transfusions. Sometimes, the cause of this condition is not known (idiopathic hives). What increases the risk? You are more likely to develop this condition if you: Are a woman. Have food allergies, especially to citrus fruits, milk, eggs, peanuts, tree nuts, or shellfish. Are allergic to: Medicines. Latex. Insects. Animals. Pollen. What are the signs or symptoms? Common symptoms of this condition include raised, itchy, red or white bumps or patches on your skin. These areas may: Become large and swollen (welts). Change in shape and location, quickly and repeatedly. Be separate hives or connect over a large area of skin. Sting or become painful. Turn white when pressed in the center (blanch). In severe cases, your hands, feet, and face may also  become swollen. This may occur if hives develop deeper in your skin. How is this diagnosed? This condition may be diagnosed by your symptoms, medical history, and physical exam. Your skin, urine, or blood may be tested to find out what is causing your hives and to rule out other health issues. Your health care provider may also remove a small sample of skin from the affected area and examine it under a microscope (biopsy). How is this treated? Treatment for this condition depends on the cause and severity of your symptoms. Your health care provider may recommend using cool, wet cloths (cool compresses) or taking cool showers to relieve itching. Treatment may include: Medicines that help: Relieve itching (antihistamines). Reduce swelling (corticosteroids). Treat infection (antibiotics). An injectable medicine (omalizumab). Your health care provider may prescribe this if you have chronic idiopathic hives and you continue to have symptoms even after treatment with antihistamines. Severe cases may require an emergency injection of adrenaline (epinephrine) to prevent a life-threatening allergic reaction (anaphylaxis). Follow these instructions at home: Medicines Take and apply over-the-counter and prescription medicines only as told by your health care provider. If you were prescribed an antibiotic medicine, take it as told by your health care provider. Do not stop using the antibiotic even if you start to feel better. Skin care Apply cool compresses to the affected areas. Do not scratch or rub your skin. General instructions Do not take hot showers or baths. This can make itching worse. Do not wear tight-fitting clothing. Use sunscreen and wear protective clothing when you are outside. Avoid any substances that cause your hives. Keep a journal to help track what causes your hives. Write down: What medicines you take.  What you eat and drink. What products you use on your skin. Keep all  follow-up visits as told by your health care provider. This is important. Contact a health care provider if: Your symptoms are not controlled with medicine. Your joints are painful or swollen. Get help right away if: You have a fever. You have pain in your abdomen. Your tongue or lips are swollen. Your eyelids are swollen. Your chest or throat feels tight. You have trouble breathing or swallowing. These symptoms may represent a serious problem that is an emergency. Do not wait to see if the symptoms will go away. Get medical help right away. Call your local emergency services (911 in the [place] Do not drive yourself to the hospital. Summary Hives (urticaria) are itchy, red, swollen areas on your skin. Hives come from the body's reaction to something a person is allergic to (allergen), something that causes irritation, or various other triggers. Treatment for this condition depends on the cause and severity of your symptoms. Avoid any substances that cause your hives. Keep a journal to help track what causes your hives. Take and apply over-the-counter and prescription medicines only as told by your health care provider. Get help right away if your chest or throat feels tight or if you have trouble breathing or swallowing. This information is not intended to replace advice given to you by your health care provider. Make sure you discuss any questions you have with your health care provider. Document Revised: 08/15/2020 Document Reviewed: 08/15/2020 Elsevier Patient Education  [place].

## 2021-07-05 NOTE — Progress Notes (Signed)
Subjective:    Patient ID: Kelly Reed, female    DOB: 1966/05/15, 55 y.o.   MRN: [id number]  55y/o caucasian female married established patient here for evaluation runny nose that started after she walked into warehouse this morning and won't stop.  Took her singulair last night and antihistamine, flonase, nasal saline at home.  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  Nose raw from wiping it so much today.  Doesn't feel short of breath, wheezing, tongue swelling, headache, chest pain but has noticed neck a little red along with her nose.  Denied n/v/d/fever/chills/sick contacts.  "I am allergic to this building"  "I was fine at home all weekend."     Review of Systems  Constitutional:  Negative for activity change, appetite change, chills, diaphoresis, fatigue and fever.  HENT:  Positive for congestion, postnasal drip, rhinorrhea, sinus pressure, sinus pain and sneezing. Negative for dental problem, drooling, ear discharge, ear pain, facial swelling, hearing loss, mouth sores, nosebleeds, sore throat, tinnitus, trouble swallowing and voice change.   Eyes:  Negative for photophobia, pain, discharge, redness, itching and visual disturbance.  Respiratory:  Positive for cough. Negative for choking, chest tightness, shortness of breath, wheezing and stridor.   Cardiovascular:  Negative for chest pain.  Gastrointestinal:  Negative for diarrhea, nausea and vomiting.  Endocrine: Negative for cold intolerance and heat intolerance.  Genitourinary:  Negative for difficulty urinating.  Musculoskeletal:  Negative for back pain, gait problem, myalgias, neck pain and neck stiffness.  Skin:  Positive for rash. Negative for pallor and wound.  Allergic/Immunologic: Positive for environmental allergies and immunocompromised state. Negative for food allergies.  Neurological:  Negative for dizziness, tremors, seizures,  syncope, facial asymmetry, speech difficulty, weakness, light-headedness, numbness and headaches.  Hematological:  Negative for adenopathy. Does not bruise/bleed easily.  Psychiatric/Behavioral:  Negative for agitation, confusion and sleep disturbance.       Objective:   Physical Exam Vitals and nursing note reviewed.  Constitutional:      General: She is awake. She is not in acute distress.    Appearance: Normal appearance. She is well-developed, well-groomed and overweight. She is not ill-appearing, toxic-appearing or diaphoretic.  HENT:     Head: Normocephalic and atraumatic. No right periorbital erythema or left periorbital erythema.     Jaw: There is normal jaw occlusion. No trismus, tenderness, swelling or pain on movement.     Salivary Glands: Right salivary gland is not diffusely enlarged or tender. Left salivary gland is not diffusely enlarged or tender.     Right Ear: Hearing, ear canal and external ear normal. A middle ear effusion is present. There is no impacted cerumen.     Left Ear: Hearing, ear canal and external ear normal. A middle ear effusion is present. There is no impacted cerumen.     Nose: Mucosal edema, congestion and rhinorrhea present. No nasal deformity, septal deviation or laceration. Rhinorrhea is clear.     Right Nostril: No foreign body, epistaxis or occlusion.     Left Nostril: No foreign body, epistaxis or occlusion.     Right Turbinates: Enlarged and swollen. Not pale.     Left Turbinates: Enlarged and swollen. Not pale.     Right Sinus: Maxillary sinus tenderness and frontal sinus tenderness present.     Left Sinus: Maxillary sinus tenderness and frontal sinus tenderness present.      Comments: Maxillary greater than frontal sinuses  TTP bilaterally, cobblestoning posterior pharynx; bilateral allergic shiners; macular papular erythema upper lip/nares/nose; anterior neck with macular erythema and fine scale noted; frequent blowing nose clear discharge  bilateral nares, congestion nasal; bilateral TMs air fluid level clear    Mouth/Throat:     Lips: Pink. No lesions.     Mouth: Mucous membranes are moist. Mucous membranes are not pale, not dry and not cyanotic. No lacerations, oral lesions or angioedema.     Dentition: No gingival swelling, dental abscesses or gum lesions.     Pharynx: Uvula midline. Pharyngeal swelling and posterior oropharyngeal erythema present. No oropharyngeal exudate or uvula swelling.     Tonsils: No tonsillar exudate or tonsillar abscesses. 0 on the right. 0 on the left.  Eyes:     General: Lids are normal. Vision grossly intact. Gaze aligned appropriately. Allergic shiner present. No visual field deficit or scleral icterus.       Right eye: No foreign body, discharge or hordeolum.        Left eye: No foreign body, discharge or hordeolum.     Extraocular Movements: Extraocular movements intact.     Right eye: Normal extraocular motion and no nystagmus.     Left eye: Normal extraocular motion and no nystagmus.     Conjunctiva/sclera: Conjunctivae normal.     Right eye: Right conjunctiva is not injected. No chemosis, exudate or hemorrhage.    Left eye: Left conjunctiva is not injected. No chemosis, exudate or hemorrhage.    Pupils: Pupils are equal, round, and reactive to light. Pupils are equal.     Right eye: Pupil is round and reactive.     Left eye: Pupil is round and reactive.  Neck:     Thyroid: No thyroid mass or thyromegaly.     Trachea: Trachea and phonation normal. No tracheal tenderness, abnormal tracheal secretions or tracheal deviation.      Comments: Macular erythema with fine scale anterior neck most pronounced laterally (both sides) Cardiovascular:     Rate and Rhythm: Normal rate and regular rhythm.     Pulses: Normal pulses.          Radial pulses are 2+ on the right side and 2+ on the left side.     Heart sounds: Normal heart sounds, S1 normal and S2 normal. No murmur heard.   No friction rub.  No gallop.  Pulmonary:     Effort: Pulmonary effort is normal. No accessory muscle usage or respiratory distress.     Breath sounds: Normal breath sounds and air entry. No stridor, decreased air movement or transmitted upper airway sounds. No decreased breath sounds, wheezing, rhonchi or rales.     Comments: Spoke full sentences without difficulty; BBS CTA; no cough observed in clinic frequent throat clearing and blowing nose improved as time in clinic lengthened; RA sp02 98% capillary refill less than 2 seconds bilateral hands skin warm dry and pink Chest:     Chest wall: No tenderness.  Abdominal:     General: There is no distension.     Palpations: Abdomen is soft.  Musculoskeletal:        General: No tenderness or signs of injury. Normal range of motion.     Right shoulder: Normal. No swelling, laceration or crepitus. Normal range of motion. Normal strength.     Left shoulder: Normal. No swelling, laceration or crepitus. Normal range of motion. Normal strength.     Right elbow: No swelling, deformity, effusion or lacerations. Normal range of motion.  Left elbow: No swelling, deformity, effusion or lacerations. Normal range of motion.     Right hand: Normal. No swelling, deformity or lacerations. Normal range of motion. Normal strength.     Left hand: Normal. No swelling, deformity or lacerations. Normal range of motion. Normal strength.     Cervical back: Normal range of motion and neck supple. Erythema present. No swelling, edema, deformity, signs of trauma, lacerations, rigidity, torticollis, tenderness or crepitus. No pain with movement or muscular tenderness. Normal range of motion.     Thoracic back: No swelling, edema, deformity, signs of trauma or lacerations.     Lumbar back: No swelling, deformity, signs of trauma or lacerations.     Right hip: Normal. Normal strength.     Left hip: Normal. Normal strength.     Right knee: Normal.     Left knee: Normal.     Right lower leg:  No edema.     Left lower leg: No edema.  Lymphadenopathy:     Head:     Right side of head: No submental, submandibular, tonsillar, preauricular, posterior auricular or occipital adenopathy.     Left side of head: No submental, submandibular, tonsillar, preauricular, posterior auricular or occipital adenopathy.     Cervical: No cervical adenopathy.     Right cervical: No superficial, deep or posterior cervical adenopathy.    Left cervical: No superficial, deep or posterior cervical adenopathy.  Skin:    General: Skin is warm and dry.     Capillary Refill: Capillary refill takes less than 2 seconds.     Coloration: Skin is not ashen, cyanotic, jaundiced, mottled, pale or sallow.     Findings: Rash present. No abrasion, abscess, acne, bruising, burn, ecchymosis, erythema, signs of injury, laceration, lesion, petechiae or wound. Rash is macular, papular, scaling and urticarial. Rash is not crusting, nodular, purpuric, pustular or vesicular.     Nails: There is no clubbing.          Comments: Urticarial rash abdomen; macular erythema face and neck anterior only; papules surrounding nares bilaterally erythema macular also moist but patient rhinitis/frequent nose blowing and wiping with tissue  Neurological:     General: No focal deficit present.     Mental Status: She is alert and oriented to person, place, and time. Mental status is at baseline. She is not disoriented.     GCS: GCS eye subscore is 4. GCS verbal subscore is 5. GCS motor subscore is 6.     Cranial Nerves: Cranial nerves 2-12 are intact. No cranial nerve deficit, dysarthria or facial asymmetry.     Sensory: No sensory deficit.     Motor: Motor function is intact. No weakness, tremor, atrophy, abnormal muscle tone or seizure activity.     Coordination: Coordination is intact. Coordination normal.     Gait: Gait is intact. Gait normal.     Comments: In/out of chair without difficulty; gait sure and steady in clinic; bilateral hand  grasp equal 5/5  Psychiatric:        Attention and Perception: Attention and perception normal.        Mood and Affect: Mood and affect normal.        Speech: Speech normal.        Behavior: Behavior normal. Behavior is cooperative.        Thought Content: Thought content normal.        Cognition and Memory: Cognition and memory normal.        Judgment: Judgment normal.  Assessment & Plan:  A-urticaria and acute rhinitis  P-Continue 24 hour antihistamine of choice xyzal po daily, singulair 10mg  po qhs, flonase 1 spray each nostril bid and nasal saline 2 sprays each nostril q2h wa prn congestion/rhinitis.  Patient has home tested negative for covid in the previous week x 2.  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  Patient had already taken daily dose singulair/antihistamine.  Dispensed from PDRx prednisone 10mg  tabs sig t2 x 2 days then t1x2 days then t1/2 x 2 days #21 RF0 with breakfast.  Took first dose in clinic.  Sp02 RA stable 98% and BBS CTA.  No cough observed in clinic.  May add benadryl 25-50mg  po q6h prn facial/tongue swelling/itching/dyspnea not improved with albuterol or prednisone use.  Given 4 UD from clinic stock benadryl 25mg .  Patient reported drowsiness with benadryl typically does not take if working.  Discussed if difficulty breathing/worsening rash I do recommend taking dose and returning to clinic today.  Avoid hot steamy showers as triggers more histamine release/would worsen symptoms.  Discussed with patient if shortness of breath/wheezing not improved with prednisone/benadryl/albuterol/symbicort she is to seek same day re-evaluation with a provider typically ER.  Patient without history of anaphylaxis.  Exitcare handout on hives printed and given to patient.  If new symptoms later today to go home shower tepid water, change clothes, take another home covid test and contact clinic  staff.  Recommended patient wear surgical mask at work to decrease dust exposure stated she would prefer not to as makes it harder for her to breath even when she is completely healthy.  Patient to follow up with NP in 48 hours for re-evaluation.  Patient verbalized understanding information/instructions, agreed with plan of care and had no further questions at this time.

## 2021-07-07 ENCOUNTER — Ambulatory Visit: Payer: Self-pay | Admitting: *Deleted

## 2021-07-07 ENCOUNTER — Encounter: Payer: Self-pay | Admitting: Registered Nurse

## 2021-07-07 ENCOUNTER — Other Ambulatory Visit: Payer: Self-pay | Admitting: Internal Medicine

## 2021-07-07 ENCOUNTER — Telehealth: Payer: Self-pay | Admitting: Internal Medicine

## 2021-07-07 ENCOUNTER — Telehealth: Payer: Self-pay | Admitting: Registered Nurse

## 2021-07-07 ENCOUNTER — Other Ambulatory Visit: Payer: Self-pay

## 2021-07-07 VITALS — BP 146/97 | HR 100 | Temp 98.9°F

## 2021-07-07 DIAGNOSIS — L500 Allergic urticaria: Secondary | ICD-10-CM

## 2021-07-07 DIAGNOSIS — J302 Other seasonal allergic rhinitis: Secondary | ICD-10-CM

## 2021-07-07 MED ORDER — PREDNISONE 10 MG PO TABS: ORAL_TABLET | ORAL | 0 refills | Status: AC

## 2021-07-07 NOTE — Telephone Encounter (Signed)
RN Hildred Alamin notified me patient reported to clinic with rash update prior to NP arrival.  Abdomen resolved and neck/chest improving but now extended to thighs bilaterally. Had to take benadryl 25mg  once yesterday in addition to am prednisone 20mg . Went to patient workcenter and discussed with her will extend taper to 4 days each dose come to clinic to pick up another bottle of prednisone so total prednisone 10mg  take with breakfast 20mg  x 4 days 10mg  x 4 days 5mg  x 4 days and re-evaluate 2 or 12 Jul 2021.  If worsening over the weekend/not controlled with prednisone/benadryl 25mg  po QID prn seek follow up with urgent care/PCM.  Patient A&Ox3 spoke full sentences without difficulty respirations even and unlabored no angioedema/wheezing gait sure and steady.  No audible cough/congestion/throat clearing.  Patient wearing surgical mask at work today and rhinitis resolved.  Patient verbalized understanding information/instructions, agreed with plan of care and had no further questions at this time.

## 2021-07-07 NOTE — Progress Notes (Signed)
Pt in to clinic for f/u from Tues appt. Rash improving on chest, resolved on belly, but newly extended down to bilateral thighs per pt. Feeling overall improvement with Prednisone. Did have to take Benadryl once yesterday along with her inhaler.

## 2021-07-07 NOTE — Telephone Encounter (Signed)
Type of form received (Home Health, FMLA, disability, handicapped placard, Surgical clearance) FMLA  Form placed in (E-fax folder, Provider mailbox) Provider mailbox  Additional instructions from the patient (mail, fax, notify by phone when complete) Notify by phone when complete  Things to remember: [place] office: If form received in person, remind patient that forms take 7-10 business days CMA should attach charge sheet and put on Supervisor's desk

## 2021-07-07 NOTE — Telephone Encounter (Signed)
Noted  

## 2021-07-13 NOTE — Telephone Encounter (Signed)
I don't see a previous one scanned into her chart to copy from.

## 2021-07-13 NOTE — Telephone Encounter (Signed)
Patient calling in  Calling to check status of FMLA forms .. wants to know when they will be ready so she can come to office to pick up  Please call [phone]

## 2021-07-14 NOTE — Telephone Encounter (Signed)
Filled out and given to [facility]

## 2021-07-18 NOTE — Telephone Encounter (Signed)
Spoke with pt by phone. Didn't take the second bottle of prednisone after dispensing 12/29. Rash had begun improving. Was not having any issue until today about one hour after arrival to work and rash starting flaring up again. Did work all last week as well with no worsening. Applied hydrocortisone cream to rash this morning and sts that seems to be helping some. Advised she may restart prednisone if worsening. F/u with clinic tomorrow if not improving with cream. Pt verbalizes understanding and agreement. Denies questions or concerns.

## 2021-07-18 NOTE — Telephone Encounter (Signed)
Reviewed RN Hildred Alamin note agreed with plan of care.  Short taper prednisone may results in rebound rash.  Will discuss with patient at work 07/19/2021.

## 2021-07-18 NOTE — Telephone Encounter (Signed)
Pt has been notified that her FMLA is ready for pick up and placed upfront.

## 2021-07-20 NOTE — Telephone Encounter (Signed)
Patient seen in HR reported rash almost completely resolved again.  Denied concerns.  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 Discussed too short prednisone course could result in rash returning.  She stated she prefers to take her allergy medications.  A&Ox3 spoke full sentences without difficulty skin warm dry and pink.  Faint macular erythema noted anterior upper chest and throat.  Patient reported giving FMLA paperwork to [place] today for asthma.  Gait sure and steady.  No audible cough/wheezing/congestion/throat clearing.   Patient verbalized understanding information/instructions and had no further questions at this time.

## 2021-07-25 NOTE — Telephone Encounter (Signed)
Pt returned phone call. Rash resolved on chest and abdomen. Still present on face and chin. Using aquaphor. Continuing allergy meds as preferred vs prednisone. Denies needs or concerns.

## 2021-07-25 NOTE — Telephone Encounter (Signed)
Rash f/u. No answer. LVM.

## 2021-07-26 NOTE — Telephone Encounter (Signed)
Reviewed RN Hildred Alamin note patient still with rash using aquaphor and antihistamines preferred over steroids oral.  Will follow up later this week with patient.

## 2021-07-27 ENCOUNTER — Other Ambulatory Visit: Payer: Self-pay | Admitting: Internal Medicine

## 2021-07-27 DIAGNOSIS — Z1231 Encounter for screening mammogram for malignant neoplasm of breast: Secondary | ICD-10-CM

## 2021-08-01 ENCOUNTER — Ambulatory Visit
Admission: RE | Admit: 2021-08-01 | Discharge: 2021-08-01 | Disposition: A | Discharge status: Home / Self Care | Payer: No Typology Code available for payment source | Source: Ambulatory Visit | Attending: Internal Medicine | Admitting: Internal Medicine

## 2021-08-01 ENCOUNTER — Other Ambulatory Visit: Payer: Self-pay

## 2021-08-01 DIAGNOSIS — Z1231 Encounter for screening mammogram for malignant neoplasm of breast: Secondary | ICD-10-CM

## 2021-08-01 NOTE — Telephone Encounter (Signed)
Called pt. Made her aware that the original copy has been placed upfront at check in for her to pick up. Copy has been faxed as well as confirmation faxed received. No other questions or concerns.

## 2021-08-01 NOTE — Telephone Encounter (Signed)
Patient calling in about FMLA forms she dropped off to the front desk last week  Patient says she would like for nurse to fax over complete FMLA forms to insurance & would like to pick up her original forms once completed  Please call patient when ready for pick up [phone]

## 2021-08-02 ENCOUNTER — Encounter: Payer: Self-pay | Admitting: Registered Nurse

## 2021-08-02 ENCOUNTER — Ambulatory Visit: Payer: Self-pay | Admitting: Registered Nurse

## 2021-08-02 VITALS — BP 149/85 | HR 87 | Temp 99.4°F

## 2021-08-02 DIAGNOSIS — L299 Pruritus, unspecified: Secondary | ICD-10-CM

## 2021-08-02 DIAGNOSIS — H8110 Benign paroxysmal vertigo, unspecified ear: Secondary | ICD-10-CM

## 2021-08-02 DIAGNOSIS — J3089 Other allergic rhinitis: Secondary | ICD-10-CM

## 2021-08-02 MED ORDER — FLUTICASONE PROPIONATE 50 MCG/ACT NA SUSP: 1.0000 | Freq: Two times a day (BID) | NASAL | 6 refills | Status: DC

## 2021-08-02 MED ORDER — SALINE SPRAY 0.65 % NA SOLN: 2.0000 | NASAL | 0 refills | Status: DC

## 2021-08-02 MED ORDER — MECLIZINE HCL 25 MG PO TABS: 25.0000 mg | ORAL_TABLET | Freq: Four times a day (QID) | ORAL | 0 refills | Status: AC | PRN

## 2021-08-02 NOTE — Telephone Encounter (Signed)
Rash ongoing patient seen in clinic today for vertigo.  Will follow up with her again for re-evaluation.  Controlled symptoms with vaseline and xyzal.

## 2021-08-02 NOTE — Progress Notes (Signed)
Subjective:    Patient ID: Kelly Reed, female    DOB: Aug 30, 1965, 56 y.o.   MRN: [id number]  55y/o Caucasian married established female pt c/o vertigo, acute onset about 10 minutes prior to arrival at clinic. Felt like she was off balance, couldn't walk or stand straight. Felt like she was "shaking on the inside", so she went to break and ate a fiber one bar. CBG 117. Some nausea in clinic when vertigo symptoms at work with head motion.  Holding still with head tilted into her hand and not looking straight up helps her the most.  Reports doing a lot of bending over and thinks fluid in her ears shifted causing symptoms.  Was not able to get her allergy shot last week as was feeling under the weather and was due to go again today but will have to call and cancel again.  Patient has meclizine at home she has taken previously for vertigo and works well for her.  This weekend noticed rash on neck had flared up again but applied cream and it has improved but still looks like she has a leftover sunburn.  Did not restart prednisone for rash just using her usual antihistamine and vaseline equivalent in a tube at home.  "I can't drive like this"  Covid test negative last week.    Review of Systems  Constitutional:  Negative for activity change, appetite change, chills, diaphoresis, fatigue and fever.  HENT:  Positive for postnasal drip. Negative for congestion, drooling, ear discharge, ear pain, facial swelling, hearing loss, mouth sores, rhinorrhea, sinus pressure, sinus pain, sneezing, sore throat, tinnitus, trouble swallowing and voice change.   Eyes:  Negative for photophobia, pain, discharge and visual disturbance.  Respiratory:  Negative for cough, choking, chest tightness, shortness of breath, wheezing and stridor.   Cardiovascular:  Negative for chest pain, palpitations and leg swelling.  Gastrointestinal:  Positive for nausea. Negative for diarrhea and vomiting.  Endocrine: Negative for cold  intolerance and heat intolerance.  Genitourinary:  Negative for difficulty urinating.  Musculoskeletal:  Positive for gait problem. Negative for arthralgias, back pain, joint swelling, myalgias, neck pain and neck stiffness.  Skin:  Positive for color change and rash. Negative for pallor and wound.  Allergic/Immunologic: Positive for environmental allergies. Negative for food allergies.  Neurological:  Positive for dizziness, tremors, weakness and light-headedness. Negative for seizures, syncope, facial asymmetry, speech difficulty, numbness and headaches.  Hematological:  Negative for adenopathy. Does not bruise/bleed easily.  Psychiatric/Behavioral:  Negative for agitation, confusion and sleep disturbance.       Objective:   Physical Exam Vitals and nursing note reviewed.  Constitutional:      General: She is awake. She is not in acute distress.    Appearance: Normal appearance. She is well-developed, well-groomed and overweight. She is ill-appearing. She is not toxic-appearing or diaphoretic.     Comments: Patient with intermittent tremors sitting in chair speaking full sentences without difficulty and holding head in right hand and using cell phone with left and answering clinic staff questions  HENT:     Head: Normocephalic and atraumatic.     Jaw: There is normal jaw occlusion. No pain on movement.     Salivary Glands: Right salivary gland is not diffusely enlarged or tender. Left salivary gland is not diffusely enlarged or tender.     Right Ear: Hearing, ear canal and external ear normal. No decreased hearing noted. A middle ear effusion is present. There is no impacted cerumen. No  foreign body. No mastoid tenderness. No PE tube. No hemotympanum. Tympanic membrane is bulging. Tympanic membrane is not injected, scarred, perforated, erythematous or retracted.     Left Ear: Hearing, ear canal and external ear normal. No decreased hearing noted. A middle ear effusion is present. There is no  impacted cerumen. No foreign body. No mastoid tenderness. No PE tube. No hemotympanum. Tympanic membrane is bulging. Tympanic membrane is not injected, scarred, perforated, erythematous or retracted.     Nose: Nose normal. No congestion or rhinorrhea.     Right Sinus: No maxillary sinus tenderness or frontal sinus tenderness.     Left Sinus: No maxillary sinus tenderness or frontal sinus tenderness.     Mouth/Throat:     Lips: Pink. No lesions.     Mouth: Mucous membranes are moist. No lacerations, oral lesions or angioedema.     Dentition: No gum lesions.     Tongue: No lesions. Tongue does not deviate from midline.     Palate: No mass and lesions.     Pharynx: Uvula midline. Pharyngeal swelling and posterior oropharyngeal erythema present. No oropharyngeal exudate or uvula swelling.  Eyes:     General: Lids are normal. Vision grossly intact. Gaze aligned appropriately. Allergic shiner present. No scleral icterus.       Right eye: No discharge.        Left eye: No discharge.     Extraocular Movements: Extraocular movements intact.     Conjunctiva/sclera: Conjunctivae normal.     Pupils: Pupils are equal, round, and reactive to light.     Comments: Bilateral TMs air fluid level clear; bilateral allergic shiners; cobblestoning posterior pharynx  Neck:     Trachea: Trachea and phonation normal. No tracheal deviation.   Cardiovascular:     Rate and Rhythm: Normal rate and regular rhythm.     Pulses: Normal pulses.     Heart sounds: Normal heart sounds. No murmur heard.   No friction rub. No gallop.  Pulmonary:     Effort: Pulmonary effort is normal. No respiratory distress.     Breath sounds: Normal breath sounds and air entry. No stridor or transmitted upper airway sounds. No wheezing, rhonchi or rales.     Comments: Spoke full sentences without difficulty; no cough observed in exam room Abdominal:     General: Abdomen is flat.     Palpations: Abdomen is soft.  Musculoskeletal:         General: No swelling, tenderness, deformity or signs of injury. Normal range of motion.     Cervical back: Normal range of motion and neck supple. Erythema present. No swelling, edema, deformity, signs of trauma, lacerations, rigidity, spasms, torticollis, tenderness or crepitus. No pain with movement. Normal range of motion.     Thoracic back: No swelling, edema, deformity, signs of trauma or lacerations.     Right lower leg: No edema.     Left lower leg: No edema.  Lymphadenopathy:     Head:     Right side of head: No submandibular or preauricular adenopathy.     Left side of head: No submandibular or preauricular adenopathy.     Cervical: No cervical adenopathy.     Right cervical: No superficial cervical adenopathy.    Left cervical: No superficial cervical adenopathy.  Skin:    General: Skin is warm and dry.     Capillary Refill: Capillary refill takes less than 2 seconds.     Coloration: Skin is not ashen, cyanotic, jaundiced, mottled, pale  or sallow.     Findings: Erythema and rash present. No abrasion, abscess, acne, bruising, burn, ecchymosis, signs of injury, laceration, lesion, petechiae or wound. Rash is macular. Rash is not crusting, nodular, papular, purpuric, pustular, scaling, urticarial or vesicular.     Nails: There is no clubbing.          Comments: Anterior neck macular erythema no increased temperature dry warm  Neurological:     General: No focal deficit present.     Mental Status: She is alert and oriented to person, place, and time. Mental status is at baseline.     GCS: GCS eye subscore is 4. GCS verbal subscore is 5. GCS motor subscore is 6.     Cranial Nerves: Cranial nerves 2-12 are intact. No cranial nerve deficit, dysarthria or facial asymmetry.     Sensory: Sensation is intact.     Motor: Motor function is intact. No weakness, tremor, atrophy, abnormal muscle tone or seizure activity.     Coordination: Coordination abnormal.     Gait: Gait abnormal.      Comments: In/out of chair without difficulty; gait sure and steady in clinic; bilateral hand grasp and upper/lower extremity strength equal 5/5; walking down hall on discharge patient gait leans toward left or right; patient assisted by RN Hildred Alamin for balance correction 1 arm as patient refused wheelchair to car spouse driving; holding forehead with one hand and looking at floor when walking as lifting head worsens vertigo symptoms;   Psychiatric:        Attention and Perception: Attention and perception normal.        Mood and Affect: Mood and affect normal.        Speech: Speech normal.        Behavior: Behavior normal. Behavior is cooperative.        Thought Content: Thought content normal.        Cognition and Memory: Cognition and memory normal.        Judgment: Judgment normal.         Assessment & Plan:  A-vertigo, allergic rhinitis, rash pruritic rash subsequent visit  P-Discussed with patient bilateral otic effusion probably causing vertigo but could also be viral illness.  Have seen patients with URI in clinic past month with vertigo symptoms common.  Patient had to skip allergy shot last week.  Meclizine at home. Meclizine 25mg  po prn helpful in the past. Supportive treatment may take up to 4 doses meclizine per day max 100mg  per 24 hours.  Patient refused zofran at this time. Discussed signs/symptoms stroke.  Spouse drove her home recommended not driving during vertigo episodes. Follow up if aphasia, dysphasia, visual changes, weakness, fall, worst headache of life, incoordination, fever, or  ear discharge for same day re-evaluation with a provider. Consider ENT evaluation/follow up with PCM if worsening symptoms not controlled with meclizine.  Exitcare handout on vertigo.   Patient verbalized understanding of information/agreed with plan of care and had no further questions at this time.    Discussed rash could be viral related, allergy related as typically worsens when at work.   Discussed shower tepid once meclizine has been taken and working this afternoon.  Continue emollient daily after shower as dry winter air/dry skin more prone to rashes.  Hold prednisone for now since emollient and antihistamine controlling any discomfort.  Follow up re-evaluation if new or worsening rash.  Patient verbalized understanding information/instructions, agreed with plan of care and had no further questions at this time.  Follow up with allergist regarding when next immunotherapy dosing should be scheduled since missed 2 doses now.  Continue xyzal, singulair, flonase, and nasal saline.  Patient verbalized understanding information/instructions, agreed with plan of care and had no further questions at this time.

## 2021-08-02 NOTE — Patient Instructions (Signed)
Dizziness Dizziness is a common problem. It is a feeling of unsteadiness or light-headedness. You may feel like you are about to faint. Dizziness can lead to injury if you stumble or fall. Anyone can become dizzy, but dizziness is more common in older adults. This condition can be caused by a number of things, including medicines, dehydration, or illness. Follow these instructions at home: Eating and drinking  Drink enough fluid to keep your urine pale yellow. This helps to keep you from becoming dehydrated. Try to drink more clear fluids, such as water. Do not drink alcohol. Limit your caffeine intake if told to do so by your health care provider. Check ingredients and nutrition facts to see if a food or beverage contains caffeine. Limit your salt (sodium) intake if told to do so by your health care provider. Check ingredients and nutrition facts to see if a food or beverage contains sodium. Activity  Avoid making quick movements. Rise slowly from chairs and steady yourself until you feel okay. In the morning, first sit up on the side of the bed. When you feel okay, stand slowly while you hold onto something until you know that your balance is good. If you need to stand in one place for a long time, move your legs often. Tighten and relax the muscles in your legs while you are standing. Do not drive or use machinery if you feel dizzy. Avoid bending down if you feel dizzy. Place items in your home so that they are easy for you to reach without leaning over. Lifestyle Do not use any products that contain nicotine or tobacco. These products include cigarettes, chewing tobacco, and vaping devices, such as e-cigarettes. If you need help quitting, ask your health care provider. Try to reduce your stress level by using methods such as yoga or meditation. Talk with your health care provider if you need help to manage your stress. General instructions Watch your dizziness for any changes. Take  over-the-counter and prescription medicines only as told by your health care provider. Talk with your health care provider if you think that your dizziness is caused by a medicine that you are taking. Tell a friend or a family member that you are feeling dizzy. If he or she notices any changes in your behavior, have this person call your health care provider. Keep all follow-up visits. This is important. Contact a health care provider if: Your dizziness does not go away or you have new symptoms. Your dizziness or light-headedness gets worse. You feel nauseous. You have reduced hearing. You have a fever. You have neck pain or a stiff neck. Your dizziness leads to an injury or a fall. Get help right away if: You vomit or have diarrhea and are unable to eat or drink anything. You have problems talking, walking, swallowing, or using your arms, hands, or legs. You feel generally weak. You have any bleeding. You are not thinking clearly or you have trouble forming sentences. It may take a friend or family member to notice this. You have chest pain, abdominal pain, shortness of breath, or sweating. Your vision changes or you develop a severe headache. These symptoms may represent a serious problem that is an emergency. Do not wait to see if the symptoms will go away. Get medical help right away. Call your local emergency services (911 in the [place] Do not drive yourself to the hospital. Summary Dizziness is a feeling of unsteadiness or light-headedness. This condition can be caused by a number of  things, including medicines, dehydration, or illness. Anyone can become dizzy, but dizziness is more common in older adults. Drink enough fluid to keep your urine pale yellow. Do not drink alcohol. Avoid making quick movements if you feel dizzy. Monitor your dizziness for any changes. This information is not intended to replace advice given to you by your health care provider. Make sure you discuss any  questions you have with your health care provider. Document Revised: 05/31/2020 Document Reviewed: 05/31/2020 Elsevier Patient Education  2022 [employer]. Benign Positional Vertigo Vertigo is the feeling that you or your surroundings are moving when they are not. Benign positional vertigo is the most common form of vertigo. This is usually a harmless condition (benign). This condition is positional. This means that symptoms are triggered by certain movements and positions. This condition can be dangerous if it occurs while you are doing something that could cause harm to yourself or others. This includes activities such as driving or operating machinery. What are the causes? The inner ear has fluid-filled canals that help your brain sense movement and balance. When the fluid moves, the brain receives messages about your body's position. With benign positional vertigo, calcium crystals in the inner ear break free and disturb the inner ear area. This causes your brain to receive confusing messages about your body's position. What increases the risk? You are more likely to develop this condition if: You are a woman. You are 70 years of age or older. You have recently had a head injury. You have an inner ear disease. What are the signs or symptoms? Symptoms of this condition usually happen when you move your head or your eyes in different directions. Symptoms may start suddenly and usually last for less than a minute. They include: Loss of balance and falling. Feeling like you are spinning or moving. Feeling like your surroundings are spinning or moving. Nausea and vomiting. Blurred vision. Dizziness. Involuntary eye movement (nystagmus). Symptoms can be mild and cause only minor problems, or they can be severe and interfere with daily life. Episodes of benign positional vertigo may return (recur) over time. Symptoms may also improve over time. How is this diagnosed? This condition may be  diagnosed based on: Your medical history. A physical exam of the head, neck, and ears. Positional tests to check for or stimulate vertigo. You may be asked to turn your head and change positions, such as going from sitting to lying down. A health care provider will watch for symptoms of vertigo. You may be referred to a health care provider who specializes in ear, nose, and throat problems (ENT or otolaryngologist) or a provider who specializes in disorders of the nervous system (neurologist). How is this treated? This condition may be treated in a session in which your health care provider moves your head in specific positions to help the displaced crystals in your inner ear move. Treatment for this condition may take several sessions. Surgery may be needed in severe cases, but this is rare. In some cases, benign positional vertigo may resolve on its own in 2-4 weeks. Follow these instructions at home: Safety Move slowly. Avoid sudden body or head movements or certain positions, as told by your health care provider. Avoid driving or operating machinery until your health care provider says it is safe. Avoid doing any tasks that would be dangerous to you or others if vertigo occurs. If you have trouble walking or keeping your balance, try using a cane for stability. If you feel dizzy  or unstable, sit down right away. Return to your normal activities as told by your health care provider. Ask your health care provider what activities are safe for you. General instructions Take over-the-counter and prescription medicines only as told by your health care provider. Drink enough fluid to keep your urine pale yellow. Keep all follow-up visits. This is important. Contact a health care provider if: You have a fever. Your condition gets worse or you develop new symptoms. Your family or friends notice any behavioral changes. You have nausea or vomiting that gets worse. You have numbness or a prickling  and tingling sensation. Get help right away if you: Have difficulty speaking or moving. Are always dizzy or faint. Develop severe headaches. Have weakness in your legs or arms. Have changes in your hearing or vision. Develop a stiff neck. Develop sensitivity to light. These symptoms may represent a serious problem that is an emergency. Do not wait to see if the symptoms will go away. Get medical help right away. Call your local emergency services (911 in the [place] Do not drive yourself to the hospital. Summary Vertigo is the feeling that you or your surroundings are moving when they are not. Benign positional vertigo is the most common form of vertigo. This condition is caused by calcium crystals in the inner ear that become displaced. This causes a disturbance in an area of the inner ear that helps your brain sense movement and balance. Symptoms include loss of balance and falling, feeling that you or your surroundings are moving, nausea and vomiting, and blurred vision. This condition can be diagnosed based on symptoms, a physical exam, and positional tests. Follow safety instructions as told by your health care provider and keep all follow-up visits. This is important. This information is not intended to replace advice given to you by your health care provider. Make sure you discuss any questions you have with your health care provider. Document Revised: 05/26/2020 Document Reviewed: 05/26/2020 Elsevier Patient Education  2022 [employer].

## 2021-08-03 ENCOUNTER — Telehealth: Payer: Self-pay | Admitting: Registered Nurse

## 2021-08-03 DIAGNOSIS — H8110 Benign paroxysmal vertigo, unspecified ear: Secondary | ICD-10-CM

## 2021-08-04 NOTE — Telephone Encounter (Signed)
Patient seen in workcenter and stated she had taken two doses meclizine yesterday and one on Tuesday.  Was sleeping when I called.  Did not work yesterday returned today feeling much better.  Still some ear pain requested ear check.  Walked to clinic with patient and otoscopic exam with bilateral effusion no redness TM/canals bilaterally or discharge.  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  No cough observed spoke full sentences without difficulty skin warm dry and pink respirations even and unlabored RA.  No erythema anterior neck/chest today.  Patient verbalized understanding information/continue plan of care as previously discussed.  Patient agreed and had no further questions at this time.

## 2021-08-09 NOTE — Telephone Encounter (Signed)
Patient seen in workcenter feeling well denied concerns.  A&Ox3 spoke full sentences without difficulty skin warm dry and pink gait sure and steady  All symptoms resolved.  Encounter closed.

## 2021-08-13 NOTE — Telephone Encounter (Signed)
Patient seen in employee lunchroom feeling well denied concerns. No further meclizine required. A&Ox3 spoke full sentences without difficulty skin warm dry and pink respirations even and unlabored; no cough/congestion/throat clearing observed.  Encounter closed.

## 2021-08-17 ENCOUNTER — Telehealth: Payer: Self-pay | Admitting: Internal Medicine

## 2021-08-17 DIAGNOSIS — E1165 Type 2 diabetes mellitus with hyperglycemia: Secondary | ICD-10-CM

## 2021-08-17 NOTE — Telephone Encounter (Signed)
1.Medication Requested: Dulaglutide (TRULICITY) 1.5 HH/8.3UP SOPN  2. Pharmacy (Name, [facility], [place]):  [place] [address]  3. On Med List: yes   4. Last Visit with PCP: 06-10-2021  5. Next visit date with PCP: 12-09-2021  Provider is not the prescribing provider, pt states new rx was discussed w/ provider at last ov

## 2021-08-18 ENCOUNTER — Telehealth: Payer: Self-pay

## 2021-08-18 MED ORDER — TRULICITY 1.5 MG/0.5ML ~~LOC~~ SOAJ: 1.5000 mg | SUBCUTANEOUS | 11 refills | Status: DC

## 2021-08-18 NOTE — Telephone Encounter (Signed)
Pt is calling to check the status of the refill request. I advise pt that it can take up to 72 hrs for the request.

## 2021-08-18 NOTE — Telephone Encounter (Signed)
Ok to refill 

## 2021-08-25 NOTE — Telephone Encounter (Signed)
Not needed

## 2021-09-06 ENCOUNTER — Encounter: Payer: Self-pay | Admitting: Registered Nurse

## 2021-09-06 ENCOUNTER — Telehealth: Payer: Self-pay | Admitting: Registered Nurse

## 2021-09-06 DIAGNOSIS — J4541 Moderate persistent asthma with (acute) exacerbation: Secondary | ICD-10-CM

## 2021-09-06 DIAGNOSIS — J019 Acute sinusitis, unspecified: Secondary | ICD-10-CM

## 2021-09-06 NOTE — Telephone Encounter (Signed)
Patient had appt scheduled 01 Sep 2021 but due to another patient emergency in clinic her appt was delayed/cancelled.   She contacted PCM and was told to go to urgent care.  Patient did not want to go to urgent care. Attempted to reschedule patient today but she stated found left over prednisone at home and did taper and feeling better today less congestion and cough responded to prednisone finally.  Patient seen in workcenter today.  Skin warm dry and pink respirations even and unlabored no nasal congestion/cough/throat clearing.  Taking her allergy medications as prescribed.  Refused appt today as symptoms improved.  Discussed pollen tree high; avoid being outside for long periods of time consider N95 mask at work due to dust exposure and when outside during peak pollen times in the middle of the day.  Schedule follow up appts prn.  Patient verbalized understanding information/instructions, agreed with plan of care and had no further questions at this time.

## 2021-09-13 ENCOUNTER — Ambulatory Visit: Payer: Self-pay | Admitting: Registered Nurse

## 2021-09-13 ENCOUNTER — Encounter: Payer: Self-pay | Admitting: Registered Nurse

## 2021-09-13 VITALS — BP 138/89 | HR 95 | Temp 97.8°F

## 2021-09-13 DIAGNOSIS — R197 Diarrhea, unspecified: Secondary | ICD-10-CM

## 2021-09-13 DIAGNOSIS — K219 Gastro-esophageal reflux disease without esophagitis: Secondary | ICD-10-CM

## 2021-09-13 MED ORDER — LOPERAMIDE HCL 2 MG PO TABS
2.0000 mg | ORAL_TABLET | Freq: Four times a day (QID) | ORAL | 0 refills | Status: DC | PRN
Start: 2021-09-13 — End: 2021-10-04

## 2021-09-13 MED ORDER — OMEPRAZOLE 20 MG PO CPDR
20.0000 mg | DELAYED_RELEASE_CAPSULE | Freq: Two times a day (BID) | ORAL | 0 refills | Status: DC
Start: 2021-09-13 — End: 2022-03-29

## 2021-09-13 NOTE — Progress Notes (Signed)
Subjective:    Patient ID: Kelly Reed, female    DOB: December 24, 1965, 56 y.o.   MRN: [id number]  55y/o established married caucasian female pt c/o diarrhea since Sunday night. +nausea but resolved today. Denies vomiting. Generalized abd pain just prior to diarrhea but not present between episodes. States she stayed out of work yesterday due to symptoms. Has continued this morning but stopped about 0600. Was able to tolerate egg mcmuffin, mango pineapple smoothie and pretzels this morning. Covid test yesterday was negative. Just feels weak at this point.   Ran out of gatorade at home and couldn't tolerate soup broths.  Stated burps this weekend smelled like rotten eggs hasn't had any today.  Didn't come to work yesterday.  Was notified some coworkers have reported stomach bug after grandkids gave it to them.  Has been buying omeprazole OTC and imodium using prn.  Diarrhea brown watery smelly denied bright red, coffee ground or tarry stools.  Had snack about an hour ago pretzels.  Hasn't checked blood sugar recently.  Urine regular pale yellow color and frequency.  Denied back pain, fever, chills.  Thinks she is due colonoscopy this year.     Review of Systems  Constitutional:  Negative for activity change, appetite change, chills, diaphoresis, fatigue and fever.  HENT:  Negative for congestion, facial swelling, mouth sores, sinus pressure, sneezing, trouble swallowing and voice change.   Eyes:  Negative for photophobia, redness and visual disturbance.  Respiratory:  Negative for cough, shortness of breath, wheezing and stridor.   Cardiovascular:  Negative for chest pain, palpitations and leg swelling.  Gastrointestinal:  Positive for abdominal pain, diarrhea and nausea. Negative for abdominal distention, anal bleeding, blood in stool, constipation, rectal pain and vomiting.  Endocrine: Negative for cold intolerance and heat intolerance.  Genitourinary:  Negative for difficulty urinating.   Musculoskeletal:  Negative for back pain, gait problem, neck pain and neck stiffness.  Skin:  Negative for rash.  Allergic/Immunologic: Positive for environmental allergies and immunocompromised state. Negative for food allergies.  Neurological:  Negative for dizziness, tremors, seizures, syncope, speech difficulty, weakness, light-headedness, numbness and headaches.  Hematological:  Negative for adenopathy. Does not bruise/bleed easily.  Psychiatric/Behavioral:  Negative for agitation, confusion and sleep disturbance.       Objective:   Physical Exam Vitals and nursing note reviewed.  Constitutional:      General: She is awake. She is not in acute distress.    Appearance: Normal appearance. She is well-developed, well-groomed and overweight. She is not ill-appearing, toxic-appearing or diaphoretic.  HENT:     Head: Normocephalic and atraumatic.     Jaw: There is normal jaw occlusion.     Salivary Glands: Right salivary gland is not diffusely enlarged. Left salivary gland is not diffusely enlarged.     Right Ear: Hearing and external ear normal.     Left Ear: Hearing and external ear normal.     Nose: Nose normal. No congestion or rhinorrhea.     Mouth/Throat:     Lips: Pink. No lesions.     Mouth: Mucous membranes are moist.     Pharynx: Oropharynx is clear. Uvula midline.  Eyes:     General: Lids are normal. Vision grossly intact. Gaze aligned appropriately. Allergic shiner present. No scleral icterus.       Right eye: No discharge.        Left eye: No discharge.     Extraocular Movements: Extraocular movements intact.     Conjunctiva/sclera: Conjunctivae normal.  Pupils: Pupils are equal, round, and reactive to light.  Neck:     Trachea: Trachea and phonation normal. No tracheal deviation.  Cardiovascular:     Rate and Rhythm: Normal rate and regular rhythm.     Pulses: Normal pulses.          Radial pulses are 2+ on the right side and 2+ on the left side.     Heart  sounds: Normal heart sounds, S1 normal and S2 normal. No murmur heard.   No friction rub. No gallop.  Pulmonary:     Effort: Pulmonary effort is normal. No respiratory distress.     Breath sounds: Normal breath sounds and air entry. No stridor, decreased air movement or transmitted upper airway sounds. No decreased breath sounds, wheezing, rhonchi or rales.     Comments: Spoke full sentences without difficulty; no cough observed in exam room Abdominal:     General: Abdomen is flat. Bowel sounds are decreased. There is no distension.     Palpations: Abdomen is soft. There is no shifting dullness, fluid wave, hepatomegaly, splenomegaly, mass or pulsatile mass.     Tenderness: There is abdominal tenderness in the right lower quadrant, epigastric area and left lower quadrant. There is no right CVA tenderness, left CVA tenderness, guarding or rebound. Negative signs include Murphy's sign.     Hernia: No hernia is present.     Comments: Dull to percussion, hypoactive x 4 quads, mildly TTP RLQ and LLQ and exquisitely TTP epigastric area  Musculoskeletal:        General: No swelling or tenderness. Normal range of motion.     Cervical back: Normal range of motion and neck supple. No swelling, edema, deformity, erythema, signs of trauma, lacerations, rigidity, torticollis, tenderness or crepitus. No pain with movement. Normal range of motion.     Thoracic back: No swelling, edema, deformity, signs of trauma, lacerations, spasms or tenderness. Normal range of motion.     Lumbar back: No swelling, edema, deformity, signs of trauma, lacerations, spasms or tenderness. Normal range of motion. No scoliosis.     Right lower leg: No edema.     Left lower leg: No edema.  Lymphadenopathy:     Head:     Right side of head: No submandibular or preauricular adenopathy.     Left side of head: No submandibular or preauricular adenopathy.     Cervical: No cervical adenopathy.     Right cervical: No superficial  cervical adenopathy.    Left cervical: No superficial cervical adenopathy.  Skin:    General: Skin is warm and dry.     Capillary Refill: Capillary refill takes less than 2 seconds.     Coloration: Skin is not ashen, cyanotic, jaundiced, mottled, pale or sallow.     Findings: No abrasion, abscess, acne, bruising, burn, ecchymosis, erythema, signs of injury, laceration, lesion, petechiae, rash or wound.     Nails: There is no clubbing.  Neurological:     General: No focal deficit present.     Mental Status: She is alert and oriented to person, place, and time. Mental status is at baseline.     GCS: GCS eye subscore is 4. GCS verbal subscore is 5. GCS motor subscore is 6.     Cranial Nerves: Cranial nerves 2-12 are intact. No cranial nerve deficit, dysarthria or facial asymmetry.     Sensory: Sensation is intact. No sensory deficit.     Motor: Motor function is intact. No weakness, tremor, atrophy, abnormal  muscle tone or seizure activity.     Coordination: Coordination is intact. Coordination normal.     Gait: Gait is intact. Gait normal.     Comments: In/out of chair and on/off exam table without difficulty; gait sure and steady in clinic; bilateral hand grasp equal 5/5  Psychiatric:        Attention and Perception: Attention and perception normal.        Mood and Affect: Mood and affect normal.        Speech: Speech normal.        Behavior: Behavior normal. Behavior is cooperative.        Thought Content: Thought content normal.        Cognition and Memory: Cognition and memory normal.        Judgment: Judgment normal.     [facility] [address] [place], [place]  [address] Phone:  [phone]   Fax:  [phone]   January 15, 2020     [place] [id number]     Dear Ms. Janann Colonel,        Both of the polyps that I removed during your recent procedure were found to be adenomatous.  These are considered to be pre-cancerous  polyps that may have grown into cancers if they had not been removed.  Based on current nationally recognized surveillance guidelines, I recommend that you have a repeat colonoscopy in 7 years.        If you develop any new rectal bleeding, abdominal pain or significant bowel habit changes, please contact me before then     If you have any questions or concerns, please don't hesitate to call.   Sincerely,       Doran Stabler, MD    Assessment & Plan:  A-diarrhea and GERD unspecified  P-Epic reviewed and diverticulitis on CSP 2021.  Notified patient GI recommended repeat in 7 years 2028.  Patient reported PCM encouraged her to see GI again this year for repeat CSP. Viral gastroenteritis is circulating in community locally also.  Symptoms improved and tolerating regular diet today.   I have recommended advance  as tolerated.  Avoid dairy, spicy and fried foods until diarrhea resolves. Avoid dehydration drink noncaffeinated beverages (water, ginger ale, soup broth, popsicles, no sugar added gatorade/powerade) to urinate every 2-4 hours pale yellow urine.  She plans to stop at store and buy gatorade after work today.   It is easy to become dehydrated when having diarrhea along with electrolyte imbalances.  Patient to take  temperature and if less than 100.5 F may take over the counter Imodium per manufacturer's instructions typically '2mg'$  po after each loose stool maximum '16mg'$  per 24 hours.  Patient given printed  exitcare handouts on diverticulitis, viral gastroenteritis, diarrhea and foods to relieve diarrhea. Medications as directed.  Return to the clinic if symptoms persist or worsen; I have alerted the patient to call if high fever, dehydration, marked weakness, fainting, increased abdominal pain, blood in stool or vomit.  Patient verbalized agreement and understanding of treatment plan and had no further questions at this time. P2: Hand washing and fitness   Patient reported refused EGD in  2021.  She stated PCM going to refer her to GI at next appt.  Has been buying omeprazole '20mg'$  OTC discussed with patient available at [place] through [employer] will bridge refill her when she runs out as PCM appt 12/09/21.  Anti-reflux measures such as raising the  head of the bed, avoiding tight clothing or belts, avoiding eating late at night and not lying down shortly after mealtime and achieving weight loss were discussed. Avoid ASA, NSAID's, caffeine, peppermints, alcohol and tobacco. OTC H2 blockers and/or antacids are often very helpful for PRN use.  Omeprazole '20mg'$  DR po BID x 30 days as patient was only taking daily at this time  However, for chronic or daily symptoms, prescription strength H2 blockers or a trial of PPI's should be used.  Exitcare handout printed and given on GERD to patient.  Patient should alert me if there are persistent symptoms, dysphagia, weight loss or GI bleeding (bright red or black). Follow up with clinic provider or PCM if symptoms persist despite therapy. Patient verbalized agreement and understanding of treatment plan and had no further questions at this time.      P2:  Diet, Food avoidance that aggravate condition, and Fitness

## 2021-09-13 NOTE — Patient Instructions (Addendum)
Gastroesophageal Reflux Disease, Adult Gastroesophageal reflux (GER) happens when acid from the stomach flows up into the tube that connects the mouth and the stomach (esophagus). Normally, food travels down the esophagus and stays in the stomach to be digested. However, when a person has GER, food and stomach acid sometimes move back up into the esophagus. If this becomes a more serious problem, the person may be diagnosed with a disease called gastroesophageal reflux disease (GERD). GERD occurs when the reflux: Happens often. Causes frequent or severe symptoms. Causes problems such as damage to the esophagus. When stomach acid comes in contact with the esophagus, the acid may cause inflammation in the esophagus. Over time, GERD may create small holes (ulcers) in the lining of the esophagus. What are the causes? This condition is caused by a problem with the muscle between the esophagus and the stomach (lower esophageal sphincter, or LES). Normally, the LES muscle closes after food passes through the esophagus to the stomach. When the LES is weakened or abnormal, it does not close properly, and that allows food and stomach acid to go back up into the esophagus. The LES can be weakened by certain dietary substances, medicines, and medical conditions, including: Tobacco use. Pregnancy. Having a hiatal hernia. Alcohol use. Certain foods and beverages, such as coffee, chocolate, onions, and peppermint. What increases the risk? You are more likely to develop this condition if you: Have an increased body weight. Have a connective tissue disorder. Take NSAIDs, such as ibuprofen. What are the signs or symptoms? Symptoms of this condition include: Heartburn. Difficult or painful swallowing and the feeling of having a lump in the throat. A bitter taste in the mouth. Bad breath and having a large amount of saliva. Having an upset or bloated stomach and belching. Chest pain. Different conditions can  cause chest pain. Make sure you see your health care provider if you experience chest pain. Shortness of breath or wheezing. Ongoing (chronic) cough or a nighttime cough. Wearing away of tooth enamel. Weight loss. How is this diagnosed? This condition may be diagnosed based on a medical history and a physical exam. To determine if you have mild or severe GERD, your health care provider may also monitor how you respond to treatment. You may also have tests, including: A test to examine your stomach and esophagus with a small camera (endoscopy). A test that measures the acidity level in your esophagus. A test that measures how much pressure is on your esophagus. A barium swallow or modified barium swallow test to show the shape, size, and functioning of your esophagus. How is this treated? Treatment for this condition may vary depending on how severe your symptoms are. Your health care provider may recommend: Changes to your diet. Medicine. Surgery. The goal of treatment is to help relieve your symptoms and to prevent complications. Follow these instructions at home: Eating and drinking  Follow a diet as recommended by your health care provider. This may involve avoiding foods and drinks such as: Coffee and tea, with or without caffeine. Drinks that contain alcohol. Energy drinks and sports drinks. Carbonated drinks or sodas. Chocolate and cocoa. Peppermint and mint flavorings. Garlic and onions. Horseradish. Spicy and acidic foods, including peppers, chili powder, curry powder, vinegar, hot sauces, and barbecue sauce. Citrus fruit juices and citrus fruits, such as oranges, lemons, and limes. Tomato-based foods, such as red sauce, chili, salsa, and pizza with red sauce. Fried and fatty foods, such as donuts, french fries, potato chips, and high-fat dressings.  High-fat meats, such as hot dogs and fatty cuts of red and white meats, such as rib eye steak, sausage, ham, and  bacon. High-fat dairy items, such as whole milk, butter, and cream cheese. Eat small, frequent meals instead of large meals. Avoid drinking large amounts of liquid with your meals. Avoid eating meals during the 2-3 hours before bedtime. Avoid lying down right after you eat. Do not exercise right after you eat. Lifestyle  Do not use any products that contain nicotine or tobacco. These products include cigarettes, chewing tobacco, and vaping devices, such as e-cigarettes. If you need help quitting, ask your health care provider. Try to reduce your stress by using methods such as yoga or meditation. If you need help reducing stress, ask your health care provider. If you are overweight, reduce your weight to an amount that is healthy for you. Ask your health care provider for guidance about a safe weight loss goal. General instructions Pay attention to any changes in your symptoms. Take over-the-counter and prescription medicines only as told by your health care provider. Do not take aspirin, ibuprofen, or other NSAIDs unless your health care provider told you to take these medicines. Wear loose-fitting clothing. Do not wear anything tight around your waist that causes pressure on your abdomen. Raise (elevate) the head of your bed about 6 inches (15 cm). You can use a wedge to do this. Avoid bending over if this makes your symptoms worse. Keep all follow-up visits. This is important. Contact a health care provider if: You have: New symptoms. Unexplained weight loss. Difficulty swallowing or it hurts to swallow. Wheezing or a persistent cough. A hoarse voice. Your symptoms do not improve with treatment. Get help right away if: You have sudden pain in your arms, neck, jaw, teeth, or back. You suddenly feel sweaty, dizzy, or light-headed. You have chest pain or shortness of breath. You vomit and the vomit is green, yellow, or black, or it looks like blood or coffee grounds. You faint. You  have stool that is red, bloody, or black. You cannot swallow, drink, or eat. These symptoms may represent a serious problem that is an emergency. Do not wait to see if the symptoms will go away. Get medical help right away. Call your local emergency services (911 in the [place] Do not drive yourself to the hospital. Summary Gastroesophageal reflux happens when acid from the stomach flows up into the esophagus. GERD is a disease in which the reflux happens often, causes frequent or severe symptoms, or causes problems such as damage to the esophagus. Treatment for this condition may vary depending on how severe your symptoms are. Your health care provider may recommend diet and lifestyle changes, medicine, or surgery. Contact a health care provider if you have new or worsening symptoms. Take over-the-counter and prescription medicines only as told by your health care provider. Do not take aspirin, ibuprofen, or other NSAIDs unless your health care provider told you to do so. Keep all follow-up visits as told by your health care provider. This is important. This information is not intended to replace advice given to you by your health care provider. Make sure you discuss any questions you have with your health care provider. Document Revised: 01/05/2020 Document Reviewed: 01/05/2020 Elsevier Patient Education  [place]. Viral Gastroenteritis, Adult Viral gastroenteritis is also known as the stomach flu. This condition may affect your stomach, small intestine, and large intestine. It can cause sudden watery diarrhea, fever, and vomiting. This condition is  caused by many different viruses. These viruses can be passed from person to person very easily (are contagious). Diarrhea and vomiting can make you feel weak and cause you to become dehydrated. You may not be able to keep fluids down. Dehydration can make you tired and thirsty, cause you to have a dry mouth, and decrease how often you urinate.  It is important to replace the fluids that you lose from diarrhea and vomiting. What are the causes? Gastroenteritis is caused by many viruses, including rotavirus and norovirus. Norovirus is the most common cause in adults. You can get sick after being exposed to the viruses from other people. You can also get sick by: Eating food, drinking water, or touching a surface contaminated with one of these viruses. Sharing utensils or other personal items with an infected person. What increases the risk? You are more likely to develop this condition if you: Have a weak body defense system (immune system). Live with one or more children who are younger than 79 years old. Live in a nursing home. Travel on cruise ships. What are the signs or symptoms? Symptoms of this condition start suddenly 1-3 days after exposure to a virus. Symptoms may last for a few days or for as long as a week. Common symptoms include watery diarrhea and vomiting. Other symptoms include: Fever. Headache. Fatigue. Pain in the abdomen. Chills. Weakness. Nausea. Muscle aches. Loss of appetite. How is this diagnosed? This condition is diagnosed with a medical history and physical exam. You may also have a stool test to check for viruses or other infections. How is this treated? This condition typically goes away on its own. The focus of treatment is to prevent dehydration and restore lost fluids (rehydration). This condition may be treated with: An oral rehydration solution (ORS) to replace important salts and minerals (electrolytes) in your body. Take this if told by your health care provider. This is a drink that is sold at pharmacies and retail stores. Medicines to help with your symptoms. Probiotic supplements to reduce symptoms of diarrhea. Fluids given through an IV, if dehydration is severe. Older adults and people with other diseases or a weak immune system are at higher risk for dehydration. Follow these  instructions at home: Eating and drinking  Take an ORS as told by your health care provider. Drink clear fluids in small amounts as you are able. Clear fluids include: Water. Ice chips. Diluted fruit juice. Low-calorie sports drinks. Drink enough fluid to keep your urine pale yellow. Eat small amounts of healthy foods every 3-4 hours as you are able. This may include whole grains, fruits, vegetables, lean meats, and yogurt. Avoid fluids that contain a lot of sugar or caffeine, such as energy drinks, sports drinks, and soda. Avoid spicy or fatty foods. Avoid alcohol. General instructions Wash your hands often, especially after having diarrhea or vomiting. If soap and water are not available, use hand sanitizer. Make sure that all people in your household wash their hands well and often. Take over-the-counter and prescription medicines only as told by your health care provider. Rest at home while you recover. Watch your condition for any changes. Take a warm bath to relieve any burning or pain from frequent diarrhea episodes. Keep all follow-up visits as told by your health care provider. This is important. Contact a health care provider if you: Cannot keep fluids down. Have symptoms that get worse. Have new symptoms. Feel light-headed or dizzy. Have muscle cramps. Get help right away if you:  Have chest pain. Feel extremely weak or you faint. See blood in your vomit. Have vomit that looks like coffee grounds. Have bloody or black stools or stools that look like tar. Have a severe headache, a stiff neck, or both. Have a rash. Have severe pain, cramping, or bloating in your abdomen. Have trouble breathing or you are breathing very quickly. Have a fast heartbeat. Have skin that feels cold and clammy. Feel confused. Have pain when you urinate. Have signs of dehydration, such as: Dark urine, very little urine, or no urine. Cracked lips. Dry mouth. Sunken  eyes. Sleepiness. Weakness. Summary Viral gastroenteritis is also known as the stomach flu. It can cause sudden watery diarrhea, fever, and vomiting. This condition can be passed from person to person very easily (is contagious). Take an ORS if told by your health care provider. This is a drink that is sold at pharmacies and retail stores. Wash your hands often, especially after having diarrhea or vomiting. If soap and water are not available, use hand sanitizer. This information is not intended to replace advice given to you by your health care provider. Make sure you discuss any questions you have with your health care provider. Document Revised: 12/13/2018 Document Reviewed: 05/01/2018 Elsevier Patient Education  2022 [place] Choices to Help Relieve Diarrhea, Adult Diarrhea can make you feel weak and cause you to become dehydrated. It is important to choose the right foods and drinks to: Relieve diarrhea. Replace lost fluids and nutrients. Prevent dehydration. What are tips for following this plan? Relieving diarrhea Avoid foods that make your diarrhea worse. These may include: Foods and beverages sweetened with high-fructose corn syrup, honey, or sweeteners such as xylitol, sorbitol, and mannitol. Fried, greasy, or spicy foods. Raw fruits and vegetables. Eat foods that are rich in probiotics. These include foods such as yogurt and fermented milk products. Probiotics can help increase healthy bacteria in your stomach and intestines (gastrointestinal tract or GI tract). This may help digestion and stop diarrhea. If you have lactose intolerance, avoid dairy products. These may make your diarrhea worse. Take medicine to help stop diarrhea only as told by your health care provider. Replacing nutrients  Eat bland, easy-to-digest foods in small amounts as you are able, until your diarrhea starts to get better. These foods include bananas, applesauce, rice, toast, and  crackers. Gradually reintroduce nutrient-rich foods as tolerated or as told by your health care provider. This includes: Well-cooked protein foods, such as eggs, lean meats like fish or chicken without skin, and tofu. Peeled, seeded, and soft-cooked fruits and vegetables. Low-fat dairy products. Whole grains. Take vitamin and mineral supplements as told by your health care provider. Preventing dehydration  Start by sipping water or a solution to prevent dehydration (oral rehydration solution, ORS). This is a drink that helps replace fluids and minerals your body has lost. You can buy an ORS at pharmacies and retail stores. Try to drink at least 8-10 cups (2,000-2,500 mL) of fluid each day to help replace lost fluids. If you have urine that is pale yellow, you are getting enough fluids. You may drink other liquids in addition to water, such as fruit juice that you have added water to (diluted fruit juice) or low-calorie sports drinks, as tolerated or as told by your health care provider. Avoid drinks with caffeine, such as coffee, tea, or soft drinks. Avoid alcohol. Summary When you have diarrhea, it is important to choose the right foods and drinks to relieve diarrhea, to replace lost  fluids and nutrients, and to prevent dehydration. Make sure you drink enough fluid to keep your urine pale yellow. You may benefit from eating bland foods at first. Gradually reintroduce healthy, nutrient-rich foods as tolerated or as told by your health care provider. Avoid foods that make your diarrhea worse, such as fried, greasy, or spicy foods. This information is not intended to replace advice given to you by your health care provider. Make sure you discuss any questions you have with your health care provider. Document Revised: 08/12/2019 Document Reviewed: 08/12/2019 Elsevier Patient Education  2022 [place]. Diarrhea, Adult Diarrhea is frequent loose and watery bowel movements. Diarrhea can make you  feel weak and cause you to become dehydrated. Dehydration can make you tired and thirsty, cause you to have a dry mouth, and decrease how often you urinate. Diarrhea typically lasts 2-3 days. However, it can last longer if it is a sign of something more serious. It is important to treat your diarrhea as told by your health care provider. Follow these instructions at home: Eating and drinking   Follow these recommendations as told by your health care provider: Take an oral rehydration solution (ORS). This is an over-the-counter medicine that helps return your body to its normal balance of nutrients and water. It is found at pharmacies and retail stores. Drink plenty of fluids, such as water, ice chips, diluted fruit juice, and low-calorie sports drinks. You can drink milk also, if desired. Avoid drinking fluids that contain a lot of sugar or caffeine, such as energy drinks, sports drinks, and soda. Eat bland, easy-to-digest foods in small amounts as you are able. These foods include bananas, applesauce, rice, lean meats, toast, and crackers. Avoid alcohol. Avoid spicy or fatty foods.  Medicines Take over-the-counter and prescription medicines only as told by your health care provider. If you were prescribed an antibiotic medicine, take it as told by your health care provider. Do not stop using the antibiotic even if you start to feel better. General instructions  Wash your hands often using soap and water. If soap and water are not available, use a hand sanitizer. Others in the household should wash their hands as well. Hands should be washed: After using the toilet or changing a diaper. Before preparing, cooking, or serving food. While caring for a sick person or while visiting someone in a hospital. Drink enough fluid to keep your urine pale yellow. Rest at home while you recover. Watch your condition for any changes. Take a warm bath to relieve any burning or pain from frequent diarrhea  episodes. Keep all follow-up visits as told by your health care provider. This is important. Contact a health care provider if: You have a fever. Your diarrhea gets worse. You have new symptoms. You cannot keep fluids down. You feel light-headed or dizzy. You have a headache. You have muscle cramps. Get help right away if: You have chest pain. You feel extremely weak or you faint. You have bloody or black stools or stools that look like tar. You have severe pain, cramping, or bloating in your abdomen. You have trouble breathing or you are breathing very quickly. Your heart is beating very quickly. Your skin feels cold and clammy. You feel confused. You have signs of dehydration, such as: Dark urine, very little urine, or no urine. Cracked lips. Dry mouth. Sunken eyes. Sleepiness. Weakness. Summary Diarrhea is frequent loose and sometimes watery bowel movements. Diarrhea can make you feel weak and cause you to become dehydrated. Drink  enough fluids to keep your urine pale yellow. Make sure that you wash your hands after using the toilet. If soap and water are not available, use hand sanitizer. Contact a health care provider if your diarrhea gets worse or you have new symptoms. Get help right away if you have signs of dehydration. This information is not intended to replace advice given to you by your health care provider. Make sure you discuss any questions you have with your health care provider. Document Revised: 01/05/2021 Document Reviewed: 01/05/2021 Elsevier Patient Education  [place]. Diverticulitis Diverticulitis is infection or inflammation of small pouches (diverticula) in the colon that form due to a condition called diverticulosis. Diverticula can trap stool (feces) and bacteria, causing infection and inflammation. Diverticulitis may cause severe stomach pain and diarrhea. It may lead to tissue damage in the colon that causes bleeding or blockage. The  diverticula may also burst (rupture) and cause infected stool to enter other areas of the abdomen. What are the causes? This condition is caused by stool becoming trapped in the diverticula, which allows bacteria to grow in the diverticula. This leads to inflammation and infection. What increases the risk? You are more likely to develop this condition if you have diverticulosis. The risk increases if you: Are overweight or obese. Do not get enough exercise. Drink alcohol. Use tobacco products. Eat a diet that has a lot of red meat such as beef, pork, or lamb. Eat a diet that does not include enough fiber. High-fiber foods include fruits, vegetables, beans, nuts, and whole grains. Are over 11 years of age. What are the signs or symptoms? Symptoms of this condition may include: Pain and tenderness in the abdomen. The pain is normally located on the left side of the abdomen, but it may occur in other areas. Fever and chills. Nausea. Vomiting. Cramping. Bloating. Changes in bowel routines. Blood in your stool. How is this diagnosed? This condition is diagnosed based on: Your medical history. A physical exam. Tests to make sure there is nothing else causing your condition. These tests may include: Blood tests. Urine tests. CT scan of the abdomen. How is this treated? Most cases of this condition are mild and can be treated at home. Treatment may include: Taking over-the-counter pain medicines. Following a clear liquid diet. Taking antibiotic medicines by mouth. Resting. More severe cases may need to be treated at a hospital. Treatment may include: Not eating or drinking. Taking prescription pain medicine. Receiving antibiotic medicines through an IV. Receiving fluids and nutrition through an IV. Surgery. When your condition is under control, your health care provider may recommend that you have a colonoscopy. This is an exam to look at the entire large intestine. During the exam,  a lubricated, bendable tube is inserted into the anus and then passed into the rectum, colon, and other parts of the large intestine. A colonoscopy can show how severe your diverticula are and whether something else may be causing your symptoms. Follow these instructions at home: Medicines Take over-the-counter and prescription medicines only as told by your health care provider. These include fiber supplements, probiotics, and stool softeners. If you were prescribed an antibiotic medicine, take it as told by your health care provider. Do not stop taking the antibiotic even if you start to feel better. Ask your health care provider if the medicine prescribed to you requires you to avoid driving or using machinery. Eating and drinking  Follow a full liquid diet or another diet as directed by your  health care provider. After your symptoms improve, your health care provider may tell you to change your diet. He or she may recommend that you eat a diet that contains at least 25 grams (25 g) of fiber daily. Fiber makes it easier to pass stool. Healthy sources of fiber include: Berries. One cup contains 4-8 grams of fiber. Beans or lentils. One-half cup contains 5-8 grams of fiber. Green vegetables. One cup contains 4 grams of fiber. Avoid eating red meat. General instructions Do not use any products that contain nicotine or tobacco, such as cigarettes, e-cigarettes, and chewing tobacco. If you need help quitting, ask your health care provider. Exercise for at least 30 minutes, 3 times each week. You should exercise hard enough to raise your heart rate and break a sweat. Keep all follow-up visits as told by your health care provider. This is important. You may need to have a colonoscopy. Contact a health care provider if: Your pain does not improve. Your bowel movements do not return to normal. Get help right away if: Your pain gets worse. Your symptoms do not get better with treatment. Your  symptoms suddenly get worse. You have a fever. You vomit more than one time. You have stools that are bloody, black, or tarry. Summary Diverticulitis is infection or inflammation of small pouches (diverticula) in the colon that form due to a condition called diverticulosis. Diverticula can trap stool (feces) and bacteria, causing infection and inflammation. You are at higher risk for this condition if you have diverticulosis and you eat a diet that does not include enough fiber. Most cases of this condition are mild and can be treated at home. More severe cases may need to be treated at a hospital. When your condition is under control, your health care provider may recommend that you have an exam called a colonoscopy. This exam can show how severe your diverticula are and whether something else may be causing your symptoms. Keep all follow-up visits as told by your health care provider. This is important. This information is not intended to replace advice given to you by your health care provider. Make sure you discuss any questions you have with your health care provider. Document Revised: 04/07/2019 Document Reviewed: 04/07/2019 Elsevier Patient Education  2022 [employer].

## 2021-09-27 ENCOUNTER — Telehealth: Payer: Self-pay | Admitting: Registered Nurse

## 2021-09-27 ENCOUNTER — Encounter: Payer: Self-pay | Admitting: Registered Nurse

## 2021-09-27 DIAGNOSIS — K219 Gastro-esophageal reflux disease without esophagitis: Secondary | ICD-10-CM

## 2021-09-27 NOTE — Telephone Encounter (Signed)
Omeprazole '20mg'$  DR po daily #90 RF0 dispensed from [facility] to patient today.  If no relief with daily dosing will increase to BID.  Patient has appt with PCM scheduled and will get new Rx for future fills [facility] formulary.  Last magnesium level normal 1.8 08/07/2014.  Will obtain new level with next labs. ?

## 2021-10-04 ENCOUNTER — Ambulatory Visit: Payer: Self-pay | Admitting: Registered Nurse

## 2021-10-04 ENCOUNTER — Encounter: Payer: Self-pay | Admitting: Registered Nurse

## 2021-10-04 VITALS — BP 176/92 | HR 96 | Temp 98.2°F

## 2021-10-04 DIAGNOSIS — J019 Acute sinusitis, unspecified: Secondary | ICD-10-CM

## 2021-10-04 DIAGNOSIS — I10 Essential (primary) hypertension: Secondary | ICD-10-CM

## 2021-10-04 MED ORDER — PREDNISONE 10 MG PO TABS
ORAL_TABLET | ORAL | 0 refills | Status: AC
Start: 2021-10-04 — End: 2021-10-10

## 2021-10-04 MED ORDER — AZITHROMYCIN 250 MG PO TABS: ORAL_TABLET | ORAL | 0 refills | Status: DC

## 2021-10-04 NOTE — Patient Instructions (Addendum)
Asthma Attack Prevention, Adult ?Although you may not be able to change the fact that you have asthma, you can take actions to prevent episodes of asthma (asthma attacks). ?How can this condition affect me? ?Asthma attacks (flare ups) can cause trouble breathing, high-pitched whistling sounds when you breathe, most often when you breathe out (wheeze), and coughing. They may keep you from doing activities you like to do. ?What can increase my risk? ?Coming into contact with things that cause asthma symptoms (asthma triggers) can put you at risk for an asthma attack. Common asthma triggers include: ?Things you are allergic to (allergens), such as: ?Dust mite and cockroach droppings. ?Pet dander. ?Mold. ?Pollen from trees and grasses. ?Food allergies. This might be a specific food or added chemicals called sulfites. ?Irritants, such as: ?Weather changes including very cold, dry, or humid air. ?Smoke. This includes campfire smoke, air pollution, and tobacco smoke. ?Strong odors from aerosol sprays and fumes from perfume, candles, and household cleaners. ?Other triggers, such as: ?Certain medicines. This includes NSAIDs, such as ibuprofen and aspirin. ?Viral respiratory infections (colds), including runny nose (rhinitis) or infection in the sinuses (sinusitis). ?Activity, including exercise, laughing, or crying. ?Not using inhaled medicines (corticosteroids) as told. ?What actions can I take to prevent an asthma attack? ?Stay healthy. Stay up to date on all immunizations as told by your health care provider, including the yearly flu (influenza) vaccine and pneumonia vaccine. ?Many asthma attacks can be prevented by carefully following your written asthma action plan. ?Follow your asthma action plan ?Work with your health care provider to create a written asthma action plan. This plan should include: ?A list of your asthma triggers and how to avoid or reduce them. ?A list of symptoms that you may have during an asthma  attack. ?Information about which medicine to take, when to take the medicine, and how much of the medicine to take. ?Information to help you understand your peak flow measurements. ?Daily actions that you can take to prevent (control) your asthma symptoms. ?Contact information for your health care providers. ?If you have an asthma attack, act quickly. Follow the emergency steps on your written asthma action plan. This may prevent you from needing to go to the hospital. ?Monitor your asthma. To do this: ?Use your peak flow meter every morning and every evening for 2-3 weeks or as told by your health care provider. ?Record the results in a journal. ?A drop in your peak flow numbers on one or more days may mean that you are starting to have an asthma attack, even if you are not having symptoms. ?When you have asthma symptoms, write them down in a journal. ?Write down in your journal how often you need to use your fast-acting rescue inhaler. If you are using your rescue inhaler more often, it may mean that your asthma is not under control. Talk with your health care provider about adjusting your asthma treatment plan to help you prevent future asthma attacks and gain better control of your condition. ? ?Lifestyle ?Avoid or reduce contact with known outdoor allergens by staying indoors, keeping windows closed, and using air conditioning when pollen and mold counts are high. ?Do not use any products that contain nicotine or tobacco. These products include cigarettes, chewing tobacco, and vaping devices, such as e-cigarettes. If you need help quitting, ask your health care provider. ?If you are overweight, consider a weight-loss plan. ?Find ways to cope with stress and your feelings, such as mindfulness, relaxation, or breathing exercises. ?Ask your  health care provider if a breathing exercise program (pulmonary rehabilitation) may be helpful to control symptoms and improve your quality of life. ?Medicines ? ?Take  over-the-counter and prescription medicines only as told by your health care provider. ?Do not stop taking your medicine and do not take less medicine even if you start to feel better. ?Let your health care provider know: ?How often you use your rescue inhaler. ?How often you have symptoms when you are taking your regular medicines. ?If you wake up at night because of asthma symptoms. ?If you have more trouble with your breathing when you exercise. ?Activity ?Do your normal activities as told by your health care provider. Ask your health care provider what activities are safe for you. ?Some people have asthma symptoms or more asthma symptoms when they exercise. This is called exercise-induced bronchoconstriction (EIB). If you have this problem, talk with your health care provider about how to manage EIB. Some tips to follow include: ?Use your fast-acting inhaler before exercise. ?Exercise indoors if it is very cold, humid, or the pollen and mold counts are high. ?Warm up and cool down before and after exercise. ?Stop exercising right away if your asthma symptoms start or get worse. ?Where to find more information ?Asthma and Allergy Foundation of [place]: www.aafa.org ?Centers for Disease Control and Prevention: http://www.wolf.info/ ?American Lung Association: www.lung.org ?National Heart, Lung, and Blood Institute: https://wilson-eaton.com/ ?World Health Organization: RoleLink.com.br ?Get help right away if: ?You have followed your written asthma action plan and your symptoms are not improving. ?Summary ?Asthma attacks (flare ups) can cause trouble breathing, high-pitched whistling sounds when you breathe, most often when you breathe out (wheeze), and coughing. ?Work with your health care provider to create a written asthma action plan. ?Do not stop taking your medicine and do not take less medicine even if you are feeling better. ?Do not use any products that contain nicotine or tobacco. These products include cigarettes, chewing  tobacco, and vaping devices, such as e-cigarettes. If you need help quitting, ask your health care provider. ?This information is not intended to replace advice given to you by your health care provider. Make sure you discuss any questions you have with your health care provider. ?Document Revised: 12/22/2020 Document Reviewed: 12/22/2020 ?Elsevier Patient Education ? [employer]. ?Viral Respiratory Infection ?A respiratory infection is an illness that affects part of the respiratory system, such as the lungs, nose, or throat. A respiratory infection that is caused by a virus is called a viral respiratory infection. ?Common types of viral respiratory infections include: ?A cold. ?The flu (influenza). ?A respiratory syncytial virus (RSV) infection. ?What are the causes? ?This condition is caused by a virus. The virus may spread through contact with droplets or direct contact with infected people or their mucus or secretions. The virus may spread from person to person (is contagious). ?What are the signs or symptoms? ?Symptoms of this condition include: ?A stuffy or runny nose. ?A sore throat or cough. ?Shortness of breath or difficulty breathing. ?Yellow or green mucus (sputum). ?Other symptoms may include: ?A fever. ?Sweating or chills. ?Fatigue. ?Achy muscles. ?A headache. ?How is this diagnosed? ?This condition may be diagnosed based on: ?Your symptoms. ?A physical exam. ?Testing of secretions from the nose or throat. ?Chest X-ray. ?How is this treated? ?This condition may be treated with medicines, such as: ?Antiviral medicine. This may shorten the length of time a person has symptoms. ?Expectorants. These make it easier to cough up mucus. ?Decongestant nasal sprays. ?Acetaminophen or NSAIDs,  such as ibuprofen, to relieve fever and pain. ?Antibiotic medicines are not prescribed for viral infections.This is because antibiotics are designed to kill bacteria. They do not kill viruses. ?Follow these  instructions at home: ?Managing pain and congestion ?Take over-the-counter and prescription medicines only as told by your health care provider. ?If you have a sore throat, gargle with a mixture of salt and water 3-4 times a d

## 2021-10-04 NOTE — Progress Notes (Signed)
? ?Subjective:  ? ? Patient ID: Kelly Reed, female    DOB: May 22, 1966, 56 y.o.   MRN: [id number] ? ?16y\o married caucasian female established patient with worsening sinus pressure/pain, post nasal drip, productive cough yellow with some streaks of white.  Home covid test negative.  Denied fever/chills/n/v/d.  Feels like she needs steroids.  Restarted albuterol MDI prn yesterday.  Taking her xyzal, flonase, singulair, symbicort and nasal saline.  Scheduled to receive immunotherapy injection today at allergist also. ? ? ? ? ?Review of Systems  ?Constitutional:  Negative for activity change, appetite change, chills, diaphoresis, fatigue and fever.  ?HENT:  Positive for congestion, ear pain, postnasal drip, rhinorrhea, sinus pressure and sinus pain. Negative for ear discharge, facial swelling, hearing loss, mouth sores, nosebleeds, sneezing, sore throat, tinnitus, trouble swallowing and voice change.   ?Eyes:  Negative for photophobia and visual disturbance.  ?Respiratory:  Positive for cough, chest tightness and wheezing. Negative for choking, shortness of breath and stridor.   ?Cardiovascular:  Negative for chest pain.  ?Gastrointestinal:  Negative for diarrhea, nausea and vomiting.  ?Endocrine: Negative for cold intolerance and heat intolerance.  ?Genitourinary:  Negative for difficulty urinating.  ?Musculoskeletal:  Negative for gait problem, neck pain and neck stiffness.  ?Skin:  Negative for rash.  ?Allergic/Immunologic: Positive for environmental allergies. Negative for food allergies.  ?Neurological:  Negative for dizziness, tremors, seizures, syncope, facial asymmetry, speech difficulty, weakness, light-headedness, numbness and headaches.  ?Psychiatric/Behavioral:  Negative for agitation, confusion, self-injury and suicidal ideas.   ? ?   ?Objective:  ? Physical Exam ?Vitals and nursing note reviewed.  ?Constitutional:   ?   General: She is awake. She is not in acute distress. ?   Appearance: Normal  appearance. She is well-developed and well-groomed. She is obese. She is not ill-appearing, toxic-appearing or diaphoretic.  ?HENT:  ?   Head: Normocephalic and atraumatic.  ?   Jaw: There is normal jaw occlusion. No trismus.  ?   Salivary Glands: Right salivary gland is not diffusely enlarged or tender. Left salivary gland is not diffusely enlarged or tender.  ?   Right Ear: Hearing, ear canal and external ear normal. A middle ear effusion is present.  ?   Left Ear: Hearing, ear canal and external ear normal. A middle ear effusion is present.  ?   Nose: Mucosal edema, congestion and rhinorrhea present. No nasal deformity, septal deviation or laceration. Rhinorrhea is clear.  ?   Right Sinus: Maxillary sinus tenderness and frontal sinus tenderness present.  ?   Left Sinus: Maxillary sinus tenderness and frontal sinus tenderness present.  ?   Comments: Maxillary greater than frontal sinuses TTP bilaterally; air fluid level clear bilateral TMs intact; cobblestoning posterior pharynx; bilateral allergic shiners; nasal turbinates enlarged with clear discharge ?   Mouth/Throat:  ?   Lips: Pink. No lesions.  ?   Mouth: Mucous membranes are moist. Mucous membranes are not pale, not dry and not cyanotic. No lacerations, oral lesions or angioedema.  ?   Dentition: No dental abscesses or gum lesions.  ?   Tongue: No lesions. Tongue does not deviate from midline.  ?   Palate: No mass and lesions.  ?   Pharynx: Uvula midline. Pharyngeal swelling and posterior oropharyngeal erythema present. No oropharyngeal exudate or uvula swelling.  ?   Tonsils: No tonsillar exudate or tonsillar abscesses. 0 on the right. 0 on the left.  ?Eyes:  ?   General: Lids are normal. Vision grossly intact.  Gaze aligned appropriately. Allergic shiner present. No scleral icterus.    ?   Right eye: No foreign body, discharge or hordeolum.     ?   Left eye: No foreign body, discharge or hordeolum.  ?   Extraocular Movements:  ?   Right eye: Normal  extraocular motion and no nystagmus.  ?   Left eye: Normal extraocular motion and no nystagmus.  ?   Conjunctiva/sclera: Conjunctivae normal.  ?   Right eye: Right conjunctiva is not injected. No chemosis, exudate or hemorrhage. ?   Left eye: Left conjunctiva is not injected. No chemosis, exudate or hemorrhage. ?   Pupils: Pupils are equal, round, and reactive to light. Pupils are equal.  ?   Right eye: Pupil is round and reactive.  ?   Left eye: Pupil is round and reactive.  ?Neck:  ?   Thyroid: No thyroid mass or thyromegaly.  ?   Trachea: Trachea normal. No tracheal tenderness or tracheal deviation.  ?Cardiovascular:  ?   Rate and Rhythm: Normal rate and regular rhythm.  ?   Pulses:     ?     Radial pulses are 2+ on the right side and 2+ on the left side.  ?Pulmonary:  ?   Effort: Pulmonary effort is normal. No accessory muscle usage or respiratory distress.  ?   Breath sounds: Decreased air movement present. No stridor or transmitted upper airway sounds. Examination of the right-lower field reveals decreased breath sounds. Examination of the left-lower field reveals decreased breath sounds. Decreased breath sounds present. No wheezing, rhonchi or rales.  ?   Comments: BBS CTA; no cough observed in exam room; spoke full sentences without difficulty ?Chest:  ?   Chest wall: No tenderness.  ?Abdominal:  ?   General: There is no distension.  ?   Palpations: Abdomen is soft.  ?Musculoskeletal:     ?   General: No tenderness. Normal range of motion.  ?   Right hand: Normal. No swelling or lacerations. Normal strength. Normal capillary refill.  ?   Left hand: Normal. No swelling or lacerations. Normal strength. Normal capillary refill.  ?   Cervical back: Normal range of motion and neck supple. No swelling, edema, deformity, erythema, signs of trauma, lacerations, rigidity, spasms, tenderness or crepitus. No pain with movement. Normal range of motion.  ?Lymphadenopathy:  ?   Head:  ?   Right side of head: No submental,  submandibular, tonsillar, preauricular, posterior auricular or occipital adenopathy.  ?   Left side of head: No submental, submandibular, tonsillar, preauricular, posterior auricular or occipital adenopathy.  ?   Cervical: No cervical adenopathy.  ?   Right cervical: No superficial, deep or posterior cervical adenopathy. ?   Left cervical: No superficial, deep or posterior cervical adenopathy.  ?Skin: ?   General: Skin is warm and dry.  ?   Capillary Refill: Capillary refill takes less than 2 seconds.  ?   Coloration: Skin is not ashen, cyanotic, jaundiced, mottled, pale or sallow.  ?   Findings: No abrasion, abscess, acne, bruising, burn, ecchymosis, erythema, signs of injury, laceration, lesion, petechiae, rash or wound.  ?   Nails: There is no clubbing.  ?Neurological:  ?   Mental Status: She is alert and oriented to person, place, and time. She is not disoriented.  ?   GCS: GCS eye subscore is 4. GCS verbal subscore is 5. GCS motor subscore is 6.  ?   Cranial Nerves: Cranial nerves 2-12  are intact. No cranial nerve deficit, dysarthria or facial asymmetry.  ?   Sensory: Sensation is intact. No sensory deficit.  ?   Motor: Motor function is intact. No weakness, tremor, atrophy, abnormal muscle tone or seizure activity.  ?   Coordination: Coordination is intact. Coordination normal.  ?   Gait: Gait is intact. Gait normal.  ?   Comments: In/out of chair and on/off exam table without difficulty; gait sure and steady in clinic. Bilateral hand grasp equal 5/5  ?Psychiatric:     ?   Attention and Perception: Attention and perception normal.     ?   Mood and Affect: Mood and affect normal.     ?   Speech: Speech normal.     ?   Behavior: Behavior normal. Behavior is cooperative.     ?   Thought Content: Thought content normal.     ?   Cognition and Memory: Cognition and memory normal.     ?   Judgment: Judgment normal.  ? ? ? ? ? ?   ?Assessment & Plan:  ?A-acute rhinosinusitis, elevated blood pressure reading with  diagnosis of hypertension ? ?P-Prednisone taper '10mg'$  po with breakfast ('30mg'$ x2 days, '20mg'$  x 2 days, '10mg'$  x 2 days) #21 RF0 dispensed from [facility] to patient; if fever/chills, pink/tan mucous start azithromycin '250mg'$  sig t2

## 2021-10-06 ENCOUNTER — Encounter: Payer: Self-pay | Admitting: Registered Nurse

## 2021-10-06 ENCOUNTER — Telehealth: Payer: Self-pay | Admitting: Registered Nurse

## 2021-10-06 DIAGNOSIS — J069 Acute upper respiratory infection, unspecified: Secondary | ICD-10-CM

## 2021-10-10 ENCOUNTER — Ambulatory Visit (INDEPENDENT_AMBULATORY_CARE_PROVIDER_SITE_OTHER): Payer: No Typology Code available for payment source

## 2021-10-10 ENCOUNTER — Other Ambulatory Visit: Payer: Self-pay

## 2021-10-10 ENCOUNTER — Ambulatory Visit
Admission: EM | Admit: 2021-10-10 | Discharge: 2021-10-10 | Disposition: A | Discharge status: Home / Self Care | Payer: No Typology Code available for payment source | Attending: Internal Medicine | Admitting: Internal Medicine

## 2021-10-10 ENCOUNTER — Encounter: Payer: Self-pay | Admitting: Emergency Medicine

## 2021-10-10 DIAGNOSIS — R0989 Other specified symptoms and signs involving the circulatory and respiratory systems: Secondary | ICD-10-CM

## 2021-10-10 DIAGNOSIS — R059 Cough, unspecified: Secondary | ICD-10-CM

## 2021-10-10 DIAGNOSIS — J069 Acute upper respiratory infection, unspecified: Secondary | ICD-10-CM | POA: Diagnosis not present

## 2021-10-10 MED ORDER — BENZONATATE 100 MG PO CAPS: 100.0000 mg | ORAL_CAPSULE | Freq: Three times a day (TID) | ORAL | 0 refills | Status: DC | PRN

## 2021-10-10 NOTE — ED Provider Notes (Signed)
?[place] ? ? ? ?CSN: [id number] ?Arrival date & time: 10/10/21  0805 ? ? ?  ? ?History   ?Chief Complaint ?Chief Complaint  ?Patient presents with  ? Cough  ? ? ?HPI ?Kelly Reed is a 56 y.o. female.  ? ?Patient presents with 6 to 7-day history of nonproductive cough, nasal congestion, body aches, fever, sinus pressure.  Patient saw PCP on 10/04/2021 when symptoms first started and was prescribed azithromycin as well as prednisone steroid taper.  Patient reports that she has completed azithromycin but has not taken prednisone because she did not think that she needed it.  Patient has also taken several over-the-counter cold and flu medications with minimal improvement in symptoms.  Temp max was 101 at home.  Patient does have history of asthma but has not had been having to use her albuterol inhaler more often while being sick.  Denies chest pain, shortness of breath, sore throat, ear pain, nausea, vomiting, diarrhea, abdominal pain. ? ? ?Cough ? ?Past Medical History:  ?Diagnosis Date  ? Allergy   ? Anemia   ? Anxiety   ? Asthma   ? COVID-19 04/29/2019  ? COVID-19 virus detected 04/11/2019  ? Diabetes mellitus type II   ? GERD (gastroesophageal reflux disease)   ? Hyperlipidemia   ? Hypertension   ? NSVD (normal spontaneous vaginal delivery)   ? x 2  ? ? ?Patient Active Problem List  ? Diagnosis Date Noted  ? Fatigue 08/15/2019  ? Asthma 09/20/2016  ? Anemia, iron deficiency 08/07/2014  ? Allergic rhinitis 08/07/2014  ? Muscle cramp, nocturnal 08/07/2014  ? Essential hypertension, benign 12/07/2012  ? Polypharmacy 12/07/2012  ? Ganglion cyst 10/22/2012  ? Hyperlipidemia associated with type 2 diabetes mellitus ([place]) 05/04/2011  ? Anxiety and depression 02/01/2011  ? Type 2 diabetes with complication ([place]) 68/05/5725  ? ? ?Past Surgical History:  ?Procedure Laterality Date  ? GANGLION CYST EXCISION    ? left foot  ? 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 ? billary dyskinesia  ? History of Abd. ultrasound  12/04   ? negative  ? Surg. eval lap cholecstectomy  05/28/2006  ? deferred by patient  ? TUBAL LIGATION  1993  ? bilateral  ? ? ?OB History   ? ? Gravida  ?2  ? Para  ?2  ? Term  ?2  ? Preterm  ?   ? AB  ?   ? Living  ?2  ?  ? ? SAB  ?   ? IAB  ?   ? Ectopic  ?   ? Multiple  ?   ? Live Births  ?2  ?   ?  ?  ? ? ? ?Home Medications   ? ?Prior to Admission medications   ?Medication Sig Start Date End Date Taking? Authorizing Provider  ?albuterol (VENTOLIN HFA) 108 (90 Base) MCG/ACT inhaler INHALE 2 PUFFS BY MOUTH INTO THE LUNGS EVERY 6 HOURS AS NEEDED WHEEZING OR SHORTNESS OF BREATH 04/29/19  Yes Wendall Mola, NP  ?benzonatate (TESSALON) 100 MG capsule Take 1 capsule (100 mg total) by mouth every 8 (eight) hours as needed for cough. 10/10/21  Yes Kanai Hilger, Michele Rockers, FNP  ?budesonide-formoterol (SYMBICORT) 160-4.5 MCG/ACT inhaler Inhale 2 puffs into the lungs 2 (two) times daily. 11/18/20  Yes [provider]  ?Cyanocobalamin (B-12 PO) Take 2 tablets by mouth daily.   Yes [provider]  ?Dulaglutide (TRULICITY) 1.5 OM/3.5DH SOPN Inject 1.5 mg into the skin once a  week. 08/18/21  Yes Hoyt Koch, MD  ?escitalopram (LEXAPRO) 20 MG tablet Take 1 tablet (20 mg total) by mouth daily. 04/19/21  Yes Hoyt Koch, MD  ?hydrochlorothiazide (HYDRODIURIL) 25 MG tablet Take 1 tablet (25 mg total) by mouth daily. 07/07/20  Yes Just, Laurita Quint, FNP  ?levocetirizine (XYZAL) 5 MG tablet Take 1 tablet by mouth every evening.  03/08/17  Yes [provider]  ?MAGNESIUM PO Take by mouth.   Yes [provider]  ?metFORMIN (GLUCOPHAGE) 1000 MG tablet Take 1 tablet (1,000 mg total) by mouth 2 (two) times daily with a meal. 03/23/21  Yes Hoyt Koch, MD  ?montelukast (SINGULAIR) 10 MG tablet TAKE 1 TABLET(10 MG) BY MOUTH AT BEDTIME 07/08/21  Yes Hoyt Koch, MD  ?omeprazole (PRILOSEC) 20 MG capsule Take 1 capsule (20 mg total) by mouth 2 (two) times daily before a meal. 09/13/21  10/13/21 Yes Betancourt, Aura Fey, NP  ?aspirin EC 81 MG tablet Take 1 tablet (81 mg total) by mouth daily. ?Patient not taking: Reported on 06/10/2021 08/07/14   Shawnee Knapp, MD  ?azithromycin [facility]) 250 MG tablet Take 2 tablets by mouth now then 1 tablet by mouth daily days 2 through 5 10/04/21   Betancourt, Aura Fey, NP  ?fluticasone (FLONASE) 50 MCG/ACT nasal spray Place 1 spray into both nostrils 2 (two) times daily. 08/02/21 10/04/21  Betancourt, Aura Fey, NP  ?predniSONE (DELTASONE) 10 MG tablet Take 3 tablets (30 mg total) by mouth daily with breakfast for 2 days, THEN 2 tablets (20 mg total) daily with breakfast for 2 days, THEN 1 tablet (10 mg total) daily with breakfast for 2 days. 10/04/21 10/10/21  Betancourt, Aura Fey, NP  ?rosuvastatin (CRESTOR) 5 MG tablet Take 1 tablet (5 mg total) by mouth 3 (three) times a week. 09/10/20 10/04/21  Just, Laurita Quint, FNP  ?sodium chloride (OCEAN) 0.65 % SOLN nasal spray Place 2 sprays into both nostrils every 2 (two) hours while awake. 08/02/21 10/04/21  Betancourt, Aura Fey, NP  ? ? ?Family History ?Family History  ?Problem Relation Age of Onset  ? Breast cancer Mother 75  ?     pt's mother passed away from breast cancer when pt was 56 yo  ? Diabetes Maternal Grandmother   ? Colon cancer Neg Hx   ? Esophageal cancer Neg Hx   ? Rectal cancer Neg Hx   ? Stomach cancer Neg Hx   ? ? ?Social History ?Social History  ? ?Tobacco Use  ? Smoking status: Never  ? Smokeless tobacco: Never  ?Vaping Use  ? Vaping Use: Never used  ?Substance Use Topics  ? Alcohol use: Yes  ?  Alcohol/week: 1.0 - 2.0 standard drink  ?  Types: 1 - 2 Standard drinks or equivalent per week  ?  Comment: Occasional beer  ? Drug use: No  ? ? ? ?Allergies   ?Adhesive [tape] ? ? ?Review of Systems ?Review of Systems ?Per HPI ? ?Physical Exam ?Triage Vital Signs ?ED Triage Vitals  ?Enc Vitals Group  ?   BP 10/10/21 0821 (!) 140/91  ?   Pulse Rate 10/10/21 0821 (!) 103  ?   Resp 10/10/21 0821 20  ?   Temp 10/10/21 0821 99.4 ?F  (37.4 ?C)  ?   Temp Source 10/10/21 0821 Oral  ?   SpO2 10/10/21 0821 96 %  ?   Weight 10/10/21 0823 211 lb 10.3 oz (96 kg)  ?   Height 10/10/21 0823  $'5\' 11"'f$  (1.803 m)  ?   Head Circumference --   ?   Peak Flow --   ?   Pain Score 10/10/21 0823 8  ?   Pain Loc --   ?   Pain Edu? --   ?   Excl. in [place]? --   ? ?No data found. ? ?Updated Vital Signs ?BP (!) 140/91 (BP Location: Left Arm)   Pulse (!) 103   Temp 99.4 ?F (37.4 ?C) (Oral)   Resp 20   Ht '5\' 11"'$  (1.803 m)   Wt 211 lb 10.3 oz (96 kg)   SpO2 96%   BMI 29.52 kg/m?  ? ?Visual Acuity ?Right Eye Distance:   ?Left Eye Distance:   ?Bilateral Distance:   ? ?Right Eye Near:   ?Left Eye Near:    ?Bilateral Near:    ? ?Physical Exam ?Constitutional:   ?   General: She is not in acute distress. ?   Appearance: Normal appearance. She is not toxic-appearing or diaphoretic.  ?HENT:  ?   Head: Normocephalic and atraumatic.  ?   Right Ear: Tympanic membrane and ear canal normal.  ?   Left Ear: Tympanic membrane and ear canal normal.  ?   Nose: Congestion present.  ?   Mouth/Throat:  ?   Mouth: Mucous membranes are moist.  ?   Pharynx: No posterior oropharyngeal erythema.  ?Eyes:  ?   Extraocular Movements: Extraocular movements intact.  ?   Conjunctiva/sclera: Conjunctivae normal.  ?   Pupils: Pupils are equal, round, and reactive to light.  ?Cardiovascular:  ?   Rate and Rhythm: Normal rate and regular rhythm.  ?   Pulses: Normal pulses.  ?   Heart sounds: Normal heart sounds.  ?Pulmonary:  ?   Effort: Pulmonary effort is normal. No respiratory distress.  ?   Breath sounds: Normal breath sounds. No wheezing.  ?   Comments: Harsh cough on exam. ?Abdominal:  ?   General: Abdomen is flat. Bowel sounds are normal.  ?   Palpations: Abdomen is soft.  ?Musculoskeletal:     ?   General: Normal range of motion.  ?   Cervical back: Normal range of motion.  ?Skin: ?   General: Skin is warm and dry.  ?Neurological:  ?   General: No focal deficit present.  ?   Mental Status: She is  alert and oriented to person, place, and time. Mental status is at baseline.  ?Psychiatric:     ?   Mood and Affect: Mood normal.     ?   Behavior: Behavior normal.  ? ? ? ?UC Treatments / Results  ?Labs ?(al

## 2021-10-10 NOTE — ED Triage Notes (Signed)
Patient c/o non-productive cough, congestion, body aches x 1 week.  Patient does have allergies and asthma which worsened with sx's.  Patient has been taken a Z-pack and no better. ?

## 2021-10-10 NOTE — Discharge Instructions (Signed)
It appears that you have a viral upper respiratory infection that should run its course and self resolve in the next few days.  Please start taking prednisone that was prescribed by your primary care doctor.  A cough medication has been prescribed for you today at this urgent care to take as needed.  Please follow-up if symptoms persist or worsen. ?

## 2021-10-11 MED ORDER — PROMETHAZINE HCL 6.25 MG/5ML PO SYRP: 6.2500 mg | ORAL_SOLUTION | Freq: Every evening | ORAL | 0 refills | Status: DC | PRN

## 2021-10-11 NOTE — Telephone Encounter (Signed)
Spoke with patient via telephone.  Out of work today.  Saw urgent care yesterday and was told viral URI work note could only be written for 72 hours. Plans to return to work Thursday or see her [facility] Thursday if still not well enough to RTW.   She had not taken prednisone taper yet but did finish azithromycin.  Cough bothering her the most still today.  Nasal congestion audible on telephone call.  Tessalon pearles do not work for patient.  Last promethazine 2019 would like refill.  Has proair, singulair, symbicort, flonase nasal, xyzal, and nasal saline.  Electronic Rx promethazine 6.'25mg'$ /27m po qhs prn cough #120 RF0 discussed no driving/no alcohol intake before or after promethazine.  Drowsiness common.  Patient to contact clinic staff if new or worsening symptoms prior to Thursday.  Patient resting/hydrating today.  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  Duration of call 5 minutes.  Patient verbalized understanding information/instructions and had no further questions at this time. ?

## 2021-10-14 NOTE — Telephone Encounter (Signed)
RN Hildred Alamin notified me patient at work today and requested appt with NP.  Patient reminded NP clinic only Tue/Thu and offered RN appt.  Patient refused at this time. ?

## 2021-10-18 ENCOUNTER — Encounter: Payer: Self-pay | Admitting: Family Medicine

## 2021-10-18 ENCOUNTER — Ambulatory Visit: Payer: No Typology Code available for payment source | Admitting: Family Medicine

## 2021-10-18 VITALS — BP 142/88 | HR 80 | Temp 97.6°F | Ht 71.0 in | Wt 215.0 lb

## 2021-10-18 DIAGNOSIS — R062 Wheezing: Secondary | ICD-10-CM | POA: Diagnosis not present

## 2021-10-18 DIAGNOSIS — J0140 Acute pansinusitis, unspecified: Secondary | ICD-10-CM

## 2021-10-18 DIAGNOSIS — R058 Other specified cough: Secondary | ICD-10-CM

## 2021-10-18 DIAGNOSIS — J4540 Moderate persistent asthma, uncomplicated: Secondary | ICD-10-CM

## 2021-10-18 MED ORDER — ALBUTEROL SULFATE (2.5 MG/3ML) 0.083% IN NEBU: 2.5000 mg | INHALATION_SOLUTION | Freq: Once | RESPIRATORY_TRACT | Status: DC

## 2021-10-18 MED ORDER — AMOXICILLIN-POT CLAVULANATE 875-125 MG PO TABS: 1.0000 | ORAL_TABLET | Freq: Two times a day (BID) | ORAL | 0 refills | Status: DC

## 2021-10-18 NOTE — Progress Notes (Signed)
? ?Subjective:  ? ? Patient ID: Kelly Reed, female    DOB: [birth year], 56 y.o.   MRN: [id number] ? ?URI  ? ?Chief Complaint  ?Patient presents with  ? URI  ?  Started 2 weeks ago, was given tessalon (doesn't work), cough syrup, antibiotics & steroids. Sx include wheezing, cough, fatigue and pressure.   ? ?She is here with complaints of a 2 week hx of URI infection.  ?States fever on and off. No fever yesterday.  ?Sinus pain, pressure, frontal headache, ear fullness bilaterally, productive cough and wheezing. ? ?Denies chills, body aches, dizziness, chest pain, palpitations, shortness of breath, abdominal pain, nausea, vomiting, diarrhea, lower extremity edema. ? ?Negative Covid tests.  ? ?Treated with Z-pack and prednisone taper.  Completed both. ?Visit to the ED for the same on 10/10/2021. No additional antibiotics were prescribed.  They did prescribe Tessalon for cough but patient reports she has not noticed any improvement with this. ? ?Negative chest XR per ED ? ?Hx of asthma. Using albuterol daily.  Typically she does not need her albuterol. ?She is under the care of an asthma and allergy specialist. ? ?On Singulair, Flonase, Symbicort and other usual medications. ? ?She is also taking NyQuil during the day and denies drowsiness with this medication.  States she is taking a nighttime cough medication at bedtime which was prescribed by another provider.  She does not have the bottle with her today. ? ?Does not smoke.  ? ?Reports blood sugars are doing fine, her usual.  ? ? ? ?Past Medical History:  ?Diagnosis Date  ? Allergy   ? Anemia   ? Anxiety   ? Asthma   ? COVID-19 04/29/2019  ? COVID-19 virus detected 04/11/2019  ? Diabetes mellitus type II   ? GERD (gastroesophageal reflux disease)   ? Hyperlipidemia   ? Hypertension   ? NSVD (normal spontaneous vaginal delivery)   ? x 2  ? ?Current Outpatient Medications on File Prior to Visit  ?Medication Sig Dispense Refill  ? albuterol (VENTOLIN HFA) 108 (90 Base)  MCG/ACT inhaler INHALE 2 PUFFS BY MOUTH INTO THE LUNGS EVERY 6 HOURS AS NEEDED WHEEZING OR SHORTNESS OF BREATH 18 g 5  ? budesonide-formoterol (SYMBICORT) 160-4.5 MCG/ACT inhaler Inhale 2 puffs into the lungs 2 (two) times daily.    ? Cyanocobalamin (B-12 PO) Take 2 tablets by mouth daily.    ? Dulaglutide (TRULICITY) 1.5 FT/7.3UK SOPN Inject 1.5 mg into the skin once a week. 2 mL 11  ? escitalopram (LEXAPRO) 20 MG tablet Take 1 tablet (20 mg total) by mouth daily. 90 tablet 3  ? hydrochlorothiazide (HYDRODIURIL) 25 MG tablet Take 1 tablet (25 mg total) by mouth daily. 90 tablet 3  ? levocetirizine (XYZAL) 5 MG tablet Take 1 tablet by mouth every evening.   9  ? MAGNESIUM PO Take by mouth.    ? metFORMIN (GLUCOPHAGE) 1000 MG tablet Take 1 tablet (1,000 mg total) by mouth 2 (two) times daily with a meal. 180 tablet 3  ? montelukast (SINGULAIR) 10 MG tablet TAKE 1 TABLET(10 MG) BY MOUTH AT BEDTIME 90 tablet 3  ? promethazine (PHENERGAN) 6.25 MG/5ML syrup Take 5 mLs (6.25 mg total) by mouth at bedtime as needed for nausea or vomiting. 120 mL 0  ? aspirin EC 81 MG tablet Take 1 tablet (81 mg total) by mouth daily. (Patient not taking: Reported on 06/10/2021)    ? fluticasone (FLONASE) 50 MCG/ACT nasal spray Place 1 spray into both nostrils  2 (two) times daily. 16 g 6  ? omeprazole (PRILOSEC) 20 MG capsule Take 1 capsule (20 mg total) by mouth 2 (two) times daily before a meal. 30 capsule 0  ? rosuvastatin (CRESTOR) 5 MG tablet Take 1 tablet (5 mg total) by mouth 3 (three) times a week. 36 tablet 3  ? sodium chloride (OCEAN) 0.65 % SOLN nasal spray Place 2 sprays into both nostrils every 2 (two) hours while awake.  0  ? ?No current facility-administered medications on file prior to visit.  ? ? ? ? ? ?Review of Systems ?Pertinent positives and negatives in the history of present illness. ? ?   ?Objective:  ? Physical Exam ?Constitutional:   ?   General: She is not in acute distress. ?   Appearance: Normal appearance. She  is ill-appearing.  ?HENT:  ?   Nose: Congestion present.  ?   Mouth/Throat:  ?   Mouth: Mucous membranes are moist.  ?Eyes:  ?   Conjunctiva/sclera: Conjunctivae normal.  ?   Pupils: Pupils are equal, round, and reactive to light.  ?Cardiovascular:  ?   Rate and Rhythm: Normal rate and regular rhythm.  ?   Pulses: Normal pulses.  ?Pulmonary:  ?   Effort: Pulmonary effort is normal.  ?   Breath sounds: Examination of the right-upper field reveals wheezing. Examination of the left-upper field reveals wheezing. Examination of the right-lower field reveals wheezing. Examination of the left-lower field reveals wheezing. Wheezing present.  ?   Comments: Harsh cough throughout visit  ?Musculoskeletal:  ?   Cervical back: Normal range of motion and neck supple.  ?   Right lower leg: No edema.  ?   Left lower leg: No edema.  ?Lymphadenopathy:  ?   Cervical: No cervical adenopathy.  ?Skin: ?   General: Skin is warm and dry.  ?Neurological:  ?   General: No focal deficit present.  ?   Mental Status: She is alert and oriented to person, place, and time.  ?Psychiatric:     ?   Mood and Affect: Mood normal.     ?   Behavior: Behavior normal.  ? ?BP (!) 142/88 (BP Location: Left Arm, Patient Position: Sitting, Cuff Size: Large)   Pulse 80   Temp 97.6 ?F (36.4 ?C) (Temporal)   Ht '5\' 11"'$  (1.803 m)   Wt 215 lb (97.5 kg)   SpO2 98%   BMI 29.99 kg/m?  ? ? ?   ?Assessment & Plan:  ?Acute pansinusitis, recurrence not specified - Plan: amoxicillin-clavulanate (AUGMENTIN) 875-125 MG tablet ? ?Moderate persistent asthma without complication ? ?Productive cough ? ?Wheezing ? ?Reviewed previous notes including ED note and chest x-ray result. ?Albuterol nebulizer given in office per patient request and due to wheezing. ?Post neb patient reports improved breathing and post- neb exam shows lung sounds normal, no wheezing.  ?Augmentin prescribed and advised her to replace good bacteria with yogurt or probiotic. ?Recommend she start taking  guaifenesin in addition to her multisymptom cold medication. ?Continue other medications as prescribed. ?Follow-up if worsening or not back to baseline when she completes the 10-day course of antibiotics. ? ?

## 2021-10-18 NOTE — Patient Instructions (Signed)
Continue your current medications.  ? ?START guaifenesin and drink plenty of water.  ? ?START Augmentin antibiotic. Take as prescribed.  I recommend eating yogurt with live active cultures or taking a probiotic while you are on this medication. ? ?Follow-up if you are getting worse or not back to baseline when you complete the antibiotic. ?

## 2021-10-20 NOTE — Telephone Encounter (Signed)
RN Hildred Alamin spoke with patient saw UC provider started on amoxicillin.  Was off the past 48 hours and tomorrow her regularly scheduled day off.  Not feeling well today but here at work.  Offered appt patient reused at this time. ?

## 2021-10-25 NOTE — Telephone Encounter (Signed)
Patient to clinic today complaining of upset stomach.  Some loose stools in the past 24 hours but denied n/v/d/f/c.  Patient reported URI symptoms improved and now just dyspnea with exertion denied wheezing/cough/congestion.  Sp02 93-94% RA BBS CTA spoke full sentences without difficulty A&Ox3 no audible cough/congestion/throat clearing while in clinic.  Patient given 2 UD tums generic from clinic stock.  Patient skin warm dry and pink gait sure and steady  Patient to follow up if new or worsening symptoms.  Patient verbalized understanding information/instructions, agreed with plan of care and had no further questions at this time. ?

## 2021-11-02 ENCOUNTER — Encounter: Payer: Self-pay | Admitting: Registered Nurse

## 2021-11-02 ENCOUNTER — Telehealth: Payer: Self-pay | Admitting: Registered Nurse

## 2021-11-02 DIAGNOSIS — J301 Allergic rhinitis due to pollen: Secondary | ICD-10-CM

## 2021-11-02 NOTE — Telephone Encounter (Signed)
Patient reported URI symptoms have resolved.  Seen in lunchroom on break.  Spoke full sentences without difficulty  No audible cough/congestion/throat clearing/shortness of breath/wheezing.  Skin warm dry and pink.  A&Ox3.  Patient had immunotherapy 10/25/21.  Denied questions or concerns at this time. ?

## 2021-12-09 ENCOUNTER — Ambulatory Visit: Payer: No Typology Code available for payment source | Admitting: Internal Medicine

## 2021-12-23 LAB — HM DIABETES EYE EXAM

## 2021-12-27 ENCOUNTER — Encounter: Payer: Self-pay | Admitting: Internal Medicine

## 2021-12-27 ENCOUNTER — Ambulatory Visit: Payer: No Typology Code available for payment source | Admitting: Internal Medicine

## 2021-12-27 VITALS — BP 136/86 | HR 80 | Temp 98.5°F | Resp 18 | Ht 71.0 in | Wt 218.4 lb

## 2021-12-27 DIAGNOSIS — J4530 Mild persistent asthma, uncomplicated: Secondary | ICD-10-CM

## 2021-12-27 DIAGNOSIS — Z0001 Encounter for general adult medical examination with abnormal findings: Secondary | ICD-10-CM

## 2021-12-27 DIAGNOSIS — E1169 Type 2 diabetes mellitus with other specified complication: Secondary | ICD-10-CM

## 2021-12-27 DIAGNOSIS — I10 Essential (primary) hypertension: Secondary | ICD-10-CM

## 2021-12-27 DIAGNOSIS — Z23 Encounter for immunization: Secondary | ICD-10-CM

## 2021-12-27 DIAGNOSIS — E118 Type 2 diabetes mellitus with unspecified complications: Secondary | ICD-10-CM | POA: Diagnosis not present

## 2021-12-27 DIAGNOSIS — D509 Iron deficiency anemia, unspecified: Secondary | ICD-10-CM

## 2021-12-27 DIAGNOSIS — F419 Anxiety disorder, unspecified: Secondary | ICD-10-CM

## 2021-12-27 DIAGNOSIS — E785 Hyperlipidemia, unspecified: Secondary | ICD-10-CM | POA: Diagnosis not present

## 2021-12-27 DIAGNOSIS — F32A Depression, unspecified: Secondary | ICD-10-CM

## 2021-12-27 LAB — COMPREHENSIVE METABOLIC PANEL
ALT: 72 U/L — ABNORMAL HIGH (ref 0–35)
AST: 52 U/L — ABNORMAL HIGH (ref 0–37)
Albumin: 4.4 g/dL (ref 3.5–5.2)
Alkaline Phosphatase: 69 U/L (ref 39–117)
BUN: 10 mg/dL (ref 6–23)
CO2: 29 mEq/L (ref 19–32)
Calcium: 9.5 mg/dL (ref 8.4–10.5)
Chloride: 100 mEq/L (ref 96–112)
Creatinine, Ser: 0.68 mg/dL (ref 0.40–1.20)
GFR: 96.53 mL/min (ref >60.00)
Glucose, Bld: 138 mg/dL — ABNORMAL HIGH (ref 70–99)
Potassium: 3.7 mEq/L (ref 3.5–5.1)
Sodium: 138 mEq/L (ref 135–145)
Total Bilirubin: 0.5 mg/dL (ref 0.2–1.2)
Total Protein: 7.4 g/dL (ref 6.0–8.3)

## 2021-12-27 LAB — CBC
HCT: 41.2 % (ref 36.0–46.0)
Hemoglobin: 13.9 g/dL (ref 12.0–15.0)
MCHC: 33.8 g/dL (ref 30.0–36.0)
MCV: 89.9 fl (ref 78.0–100.0)
Platelets: 236 10*3/uL (ref 150.0–400.0)
RBC: 4.58 Mil/uL (ref 3.87–5.11)
RDW: 13.9 % (ref 11.5–15.5)
WBC: 6.1 10*3/uL (ref 4.0–10.5)

## 2021-12-27 LAB — LIPID PANEL
Cholesterol: 232 mg/dL — ABNORMAL HIGH (ref 0–200)
HDL: 55.9 mg/dL (ref >39.00)
LDL Cholesterol: 149 mg/dL — ABNORMAL HIGH (ref 0–99)
NonHDL: 175.69
Total CHOL/HDL Ratio: 4
Triglycerides: 135 mg/dL (ref 0.0–149.0)
VLDL: 27 mg/dL (ref 0.0–40.0)

## 2021-12-27 LAB — HEMOGLOBIN A1C: Hgb A1c MFr Bld: 7.7 % — ABNORMAL HIGH (ref 4.6–6.5)

## 2021-12-27 LAB — MICROALBUMIN / CREATININE URINE RATIO
Creatinine,U: 38.7 mg/dL
Microalb Creat Ratio: 1.8 mg/g (ref 0.0–30.0)
Microalb, Ur: <0.7 mg/dL (ref 0.0–1.9)

## 2021-12-27 LAB — VITAMIN B12: Vitamin B-12: 1205 pg/mL — ABNORMAL HIGH (ref 211–911)

## 2021-12-27 NOTE — Assessment & Plan Note (Signed)
Flu shot yearly. Covid-19 counseled. Shingrix given 1st today. Tetanus up to date. Colonoscopy up to date. Mammogram up to date, pap smear up to date. Counseled about sun safety and mole surveillance. Counseled about the dangers of distracted driving. Given 10 year screening recommendations.

## 2021-12-27 NOTE — Progress Notes (Signed)
   Subjective:   Patient ID: Kelly Reed, female    DOB: Sep 23, 1965, 56 y.o.   MRN: [id number]  HPI The patient is here for physical.  PMH, [facility], social history reviewed and updated  Review of Systems  Constitutional: Negative.   HENT: Negative.    Eyes: Negative.   Respiratory:  Negative for cough, chest tightness and shortness of breath.   Cardiovascular:  Negative for chest pain, palpitations and leg swelling.  Gastrointestinal:  Negative for abdominal distention, abdominal pain, constipation, diarrhea, nausea and vomiting.  Musculoskeletal: Negative.   Skin: Negative.   Neurological: Negative.   Psychiatric/Behavioral: Negative.      Objective:  Physical Exam Constitutional:      Appearance: She is well-developed.  HENT:     Head: Normocephalic and atraumatic.  Cardiovascular:     Rate and Rhythm: Normal rate and regular rhythm.  Pulmonary:     Effort: Pulmonary effort is normal. No respiratory distress.     Breath sounds: Normal breath sounds. No wheezing or rales.  Abdominal:     General: Bowel sounds are normal. There is no distension.     Palpations: Abdomen is soft.     Tenderness: There is no abdominal tenderness. There is no rebound.  Musculoskeletal:     Cervical back: Normal range of motion.  Skin:    General: Skin is warm and dry.     Comments: Foot exam done  Neurological:     Mental Status: She is alert and oriented to person, place, and time.     Coordination: Coordination normal.     Vitals:   12/27/21 0800 12/27/21 0840  BP: (!) 142/84 136/86  Pulse: 80   Resp: 18   Temp: 98.5 F (36.9 C)   SpO2: 98%   Weight: 218 lb 6.4 oz (99.1 kg)   Height: '5\' 11"'$  (1.803 m)     Assessment & Plan:  Shingrix IM given at visit

## 2021-12-27 NOTE — Assessment & Plan Note (Signed)
BP mildly elevated with normal recheck. Continue hctz 25 mg daily. Checking CMP and adjust as needed.

## 2021-12-27 NOTE — Assessment & Plan Note (Signed)
Checking lipid panel and not on medication currently. Adjust as needed for LDL <100.

## 2021-12-27 NOTE — Assessment & Plan Note (Signed)
Checking CBC today. No signs/symptoms of bleeding.

## 2021-12-27 NOTE — Assessment & Plan Note (Signed)
Taking lexapro 20 mg daily and mild flare today due to change in position at work and feeling undervalued. She is coping with lifestyle changes and no medication change today.

## 2021-12-27 NOTE — Assessment & Plan Note (Signed)
No flare today. Using albuterol prn. Taking allergy medication. Uses symbicort during flares BID.

## 2021-12-27 NOTE — Assessment & Plan Note (Signed)
Checking Hga1c today. Weight up slightly due to stress/stress eating. Taking trulicity 1.5 mg weekly and metformin 1000 mg BID. Adjust as needed. Foot exam done and getting records from eye exam. Checking microalbumin to creatinine ratio and CMP and lipid panel.

## 2022-01-04 ENCOUNTER — Telehealth: Payer: Self-pay | Admitting: Internal Medicine

## 2022-01-04 DIAGNOSIS — R7989 Other specified abnormal findings of blood chemistry: Secondary | ICD-10-CM

## 2022-01-04 NOTE — Telephone Encounter (Signed)
Pt is requesting a call to discuss her lab results. I advised pt "Sugars are up slightly and we should consider adjusting the dose of the trulicity to help. Your cholesterol is high and it is recommended we start a cholesterol medicine if you are willing. Otherwise normal.". She stated she is fine with taking a cholesterol medication as long as it is not the medication that she took in the past that was causing her leg cramps.   Pt stated she not ok with adjusting her trulicity dosage, she said she rather try self discipline instead.   Pt also stated her BP has been high lately and she would like to know if she should get back on BP meds.   Pt also stated she was alarmed by some results she saw in my chart about her liver. Pt would like to know if long term use of metformin is the cause.   Please advise.

## 2022-01-09 MED ORDER — ROSUVASTATIN CALCIUM 10 MG PO TABS: 10.0000 mg | ORAL_TABLET | Freq: Every day | ORAL | 3 refills | Status: DC

## 2022-01-09 NOTE — Telephone Encounter (Signed)
The liver numbers are likely up from fatty liver disease. We have not done an ultrasound on the liver which would confirm this we can order if she wants. Not likely related to metformin. Sent in crestor to take 1 pill daily for cholesterol. What has BP been running?

## 2022-01-11 NOTE — Telephone Encounter (Signed)
Pt called in for an update on past message.I advised the pt of Dr. Sharlet Salina last note stating The liver numbers are likely up from fatty liver disease. We have not done an ultrasound on the liver which would confirm this we can order if she wants. Not likely related to metformin. Sent in crestor to take 1 pill daily for cholesterol.   Pt states that she cant remember the full BP Readings she just recalls the top numbers being in between 135-145.  I advised to take BP reading and that she can give [place] a call with the log readings.   FYI

## 2022-01-12 NOTE — Telephone Encounter (Signed)
I called pt back to confirm that she would like to do the ultrasound of the liver.   Pt reports that she is unable to take Statin drug as it causes severe cramps. Previous provider reduced amount when pt was taking it before and pt d/c it all together as that didn't have the cramps.   Pt is willing to take something to help with cholesterol but just not a Statin drug.

## 2022-01-12 NOTE — Addendum Note (Signed)
Addended by: Pricilla Holm A on: 01/12/2022 08:32 AM   Modules accepted: Orders

## 2022-01-12 NOTE — Telephone Encounter (Signed)
Ordered US and we can discuss cholesterol medicine at next visit.

## 2022-01-12 NOTE — Telephone Encounter (Signed)
Was she interested in pursuing ultrasound of liver to confirm?

## 2022-01-20 ENCOUNTER — Ambulatory Visit
Admission: RE | Admit: 2022-01-20 | Discharge: 2022-01-20 | Disposition: A | Discharge status: Home / Self Care | Payer: No Typology Code available for payment source | Source: Ambulatory Visit | Attending: Internal Medicine | Admitting: Internal Medicine

## 2022-01-20 ENCOUNTER — Other Ambulatory Visit: Payer: Self-pay | Admitting: Internal Medicine

## 2022-01-20 DIAGNOSIS — R7989 Other specified abnormal findings of blood chemistry: Secondary | ICD-10-CM

## 2022-02-11 ENCOUNTER — Emergency Department (HOSPITAL_COMMUNITY): Payer: No Typology Code available for payment source

## 2022-02-11 ENCOUNTER — Emergency Department (HOSPITAL_COMMUNITY)
Admission: EM | Admit: 2022-02-11 | Discharge: 2022-02-12 | Disposition: A | Discharge status: Home / Self Care | Payer: No Typology Code available for payment source | Attending: Emergency Medicine | Admitting: Emergency Medicine

## 2022-02-11 ENCOUNTER — Encounter (HOSPITAL_COMMUNITY): Payer: Self-pay | Admitting: Emergency Medicine

## 2022-02-11 ENCOUNTER — Other Ambulatory Visit: Payer: Self-pay

## 2022-02-11 DIAGNOSIS — J4541 Moderate persistent asthma with (acute) exacerbation: Secondary | ICD-10-CM

## 2022-02-11 DIAGNOSIS — Z7902 Long term (current) use of antithrombotics/antiplatelets: Secondary | ICD-10-CM | POA: Diagnosis not present

## 2022-02-11 DIAGNOSIS — R109 Unspecified abdominal pain: Secondary | ICD-10-CM | POA: Insufficient documentation

## 2022-02-11 DIAGNOSIS — R112 Nausea with vomiting, unspecified: Secondary | ICD-10-CM

## 2022-02-11 DIAGNOSIS — R0602 Shortness of breath: Secondary | ICD-10-CM | POA: Insufficient documentation

## 2022-02-11 MED ORDER — ALBUTEROL SULFATE HFA 108 (90 BASE) MCG/ACT IN AERS
2.0000 | INHALATION_SPRAY | RESPIRATORY_TRACT | Status: DC | PRN
  Administered 2022-02-11: 2 via RESPIRATORY_TRACT
  Filled 2022-02-11: qty 6.7

## 2022-02-11 NOTE — ED Provider Notes (Signed)
[facility] EMERGENCY DEPARTMENT Provider Note   CSN: [id number] Arrival date & time: 02/11/22  2258     History  Chief Complaint  Patient presents with   Shortness of Breath    Kelly Reed is a 56 y.o. female.  Patient presents to the emergency department for evaluation of shortness of breath, indigestion and vomiting.  Patient reports that she was awakened from sleep tonight because of her GERD.  This caused her to have nausea and vomiting.  Patient reports that there was blood in her vomit.  She then developed shortness of breath and wheezing.       Home Medications Prior to Admission medications   Medication Sig Start Date End Date Taking? Authorizing Provider  albuterol (VENTOLIN HFA) 108 (90 Base) MCG/ACT inhaler INHALE 2 PUFFS BY MOUTH INTO THE LUNGS EVERY 6 HOURS AS NEEDED WHEEZING OR SHORTNESS OF BREATH 04/29/19   Wendall Mola, NP  aspirin EC 81 MG tablet Take 1 tablet (81 mg total) by mouth daily. 08/07/14   Shawnee Knapp, MD  budesonide-formoterol [facility]) 160-4.5 MCG/ACT inhaler Inhale 2 puffs into the lungs 2 (two) times daily. 11/18/20   [provider]  Cyanocobalamin (B-12 PO) Take 2 tablets by mouth daily.    [provider]  Dulaglutide (TRULICITY) 1.5 ME/2.6ST SOPN Inject 1.5 mg into the skin once a week. 08/18/21   Hoyt Koch, MD  escitalopram (LEXAPRO) 20 MG tablet Take 1 tablet (20 mg total) by mouth daily. 04/19/21   Hoyt Koch, MD  fluticasone (FLONASE) 50 MCG/ACT nasal spray Place 1 spray into both nostrils 2 (two) times daily. 08/02/21 10/04/21  Betancourt, Aura Fey, NP  hydrochlorothiazide (HYDRODIURIL) 25 MG tablet Take 1 tablet (25 mg total) by mouth daily. 07/07/20   Just, Laurita Quint, FNP  levocetirizine (XYZAL) 5 MG tablet Take 1 tablet by mouth every evening.  03/08/17   [provider]  MAGNESIUM PO Take by mouth.    [provider]  metFORMIN (GLUCOPHAGE) 1000 MG tablet Take  1 tablet (1,000 mg total) by mouth 2 (two) times daily with a meal. 03/23/21   Hoyt Koch, MD  montelukast (SINGULAIR) 10 MG tablet TAKE 1 TABLET(10 MG) BY MOUTH AT BEDTIME 07/08/21   Hoyt Koch, MD  omeprazole (PRILOSEC) 20 MG capsule Take 1 capsule (20 mg total) by mouth 2 (two) times daily before a meal. 09/13/21 10/13/21  Betancourt, Aura Fey, NP  promethazine (PHENERGAN) 6.25 MG/5ML syrup Take 5 mLs (6.25 mg total) by mouth at bedtime as needed for nausea or vomiting. 10/11/21   Betancourt, Aura Fey, NP  rosuvastatin (CRESTOR) 10 MG tablet Take 1 tablet (10 mg total) by mouth daily. 01/09/22   Hoyt Koch, MD      Allergies    Adhesive [tape]    Review of Systems   Review of Systems  Physical Exam Updated Vital Signs BP 110/76   Pulse (!) 106   Temp 98.2 F (36.8 C) (Oral)   Resp 19   SpO2 100%  Physical Exam Vitals and nursing note reviewed.  Constitutional:      General: She is not in acute distress.    Appearance: She is well-developed.  HENT:     Head: Normocephalic and atraumatic.     Mouth/Throat:     Mouth: Mucous membranes are moist.  Eyes:     General: Vision grossly intact. Gaze aligned appropriately.     Extraocular Movements: Extraocular movements intact.  Conjunctiva/sclera: Conjunctivae normal.  Cardiovascular:     Rate and Rhythm: Normal rate and regular rhythm.     Pulses: Normal pulses.     Heart sounds: Normal heart sounds, S1 normal and S2 normal. No murmur heard.    No friction rub. No gallop.  Pulmonary:     Effort: Pulmonary effort is normal. Tachypnea present. No respiratory distress.     Breath sounds: Normal breath sounds.     Comments: Patient with forced expiratory noises but no wheezing to auscultation Abdominal:     General: Bowel sounds are normal.     Palpations: Abdomen is soft.     Tenderness: There is no abdominal tenderness. There is no guarding or rebound.     Hernia: No hernia is present.  Musculoskeletal:         General: No swelling.     Cervical back: Full passive range of motion without pain, normal range of motion and neck supple. No spinous process tenderness or muscular tenderness. Normal range of motion.     Right lower leg: No edema.     Left lower leg: No edema.  Skin:    General: Skin is warm and dry.     Capillary Refill: Capillary refill takes less than 2 seconds.     Findings: No ecchymosis, erythema, rash or wound.  Neurological:     General: No focal deficit present.     Mental Status: She is alert and oriented to person, place, and time.     GCS: GCS eye subscore is 4. GCS verbal subscore is 5. GCS motor subscore is 6.     Cranial Nerves: Cranial nerves 2-12 are intact.     Sensory: Sensation is intact.     Motor: Motor function is intact.     Coordination: Coordination is intact.  Psychiatric:        Attention and Perception: Attention normal.        Mood and Affect: Mood normal.        Speech: Speech normal.        Behavior: Behavior normal.     ED Results / Procedures / Treatments   Labs (all labs ordered are listed, but only abnormal results are displayed) Labs Reviewed  CBC  BASIC METABOLIC PANEL  HEPATIC FUNCTION PANEL  LIPASE, BLOOD  TROPONIN I (HIGH SENSITIVITY)    EKG None  Radiology No results found.  Procedures Procedures    Medications Ordered in ED Medications  albuterol (VENTOLIN HFA) 108 (90 Base) MCG/ACT inhaler 2 puff (2 puffs Inhalation Given 02/11/22 2316)    ED Course/ Medical Decision Making/ A&P                           Medical Decision Making Amount and/or Complexity of Data Reviewed Labs: ordered. Radiology: ordered.  Risk Prescription drug management.   Patient reports waking up with reflux symptoms.  She does have a history of fairly severe GERD, takes Prilosec OTC daily for this.  Patient began experiencing nausea and vomiting.  Noticed some blood in her vomit which concerned her enough to come to the ER.  She  reports intermittent stabbing pains across the upper abdomen.  Patient reports that on her way to the hospital she started having shortness of breath and wheezing secondary to her asthma.  Patient did have some wheezing at arrival.  Treated with bronchodilator therapy.  Lab work is reassuring.  Vital signs are reassuring.  No hypotension,  no anemia.  Suspect mild gastritis or possibly Mallory-Weiss tear.  I do not feel she requires hospitalization for endoscopy.  Underwent CT abdomen and pelvis which was unremarkable.  We will discharge, recommend increasing Prilosec to twice a day.  Add Pepcid twice a day.  We will prescribe Carafate.  Continue bronchodilator therapy.        Final Clinical Impression(s) / ED Diagnoses Final diagnoses:  None    Rx / DC Orders ED Discharge Orders     None         Kerry Odonohue, Gwenyth Allegra, MD 02/12/22 [phone]

## 2022-02-11 NOTE — ED Notes (Signed)
Patient 82-90% on RA. Patient placed on 2L oxygen and triage RN notified

## 2022-02-11 NOTE — ED Notes (Signed)
Pt vitals checked out front. Pt 02 setting at 82% RA. Pt placed on 2 liters of oxygen. Nurse notified.

## 2022-02-11 NOTE — ED Triage Notes (Signed)
Pt states she was awakened from sleep by her GERD she thinks  She got up and began to vomit, then feels that this may have exacerbated her asthma.  Hypoxic and coughing on arrival.  Placed on 3L [place] in triage.  Moved to room.

## 2022-02-12 ENCOUNTER — Emergency Department (HOSPITAL_COMMUNITY): Payer: No Typology Code available for payment source

## 2022-02-12 LAB — HEPATIC FUNCTION PANEL
ALT: 73 U/L — ABNORMAL HIGH (ref 0–44)
AST: 54 U/L — ABNORMAL HIGH (ref 15–41)
Albumin: 4.1 g/dL (ref 3.5–5.0)
Alkaline Phosphatase: 85 U/L (ref 38–126)
Bilirubin, Direct: 0.1 mg/dL (ref 0.0–0.2)
Indirect Bilirubin: 0.4 mg/dL (ref 0.3–0.9)
Total Bilirubin: 0.5 mg/dL (ref 0.3–1.2)
Total Protein: 7 g/dL (ref 6.5–8.1)

## 2022-02-12 LAB — BASIC METABOLIC PANEL
Anion gap: 12 (ref 5–15)
BUN: 11 mg/dL (ref 6–20)
CO2: 20 mmol/L — ABNORMAL LOW (ref 22–32)
Calcium: 9.3 mg/dL (ref 8.9–10.3)
Chloride: 107 mmol/L (ref 98–111)
Creatinine, Ser: 0.86 mg/dL (ref 0.44–1.00)
GFR, Estimated: >60 mL/min (ref >60)
Glucose, Bld: 170 mg/dL — ABNORMAL HIGH (ref 70–99)
Potassium: 3.9 mmol/L (ref 3.5–5.1)
Sodium: 139 mmol/L (ref 135–145)

## 2022-02-12 LAB — CBC
HCT: 40.9 % (ref 36.0–46.0)
Hemoglobin: 14.2 g/dL (ref 12.0–15.0)
MCH: 30.5 pg (ref 26.0–34.0)
MCHC: 34.7 g/dL (ref 30.0–36.0)
MCV: 87.8 fL (ref 80.0–100.0)
Platelets: 321 10*3/uL (ref 150–400)
RBC: 4.66 MIL/uL (ref 3.87–5.11)
RDW: 13 % (ref 11.5–15.5)
WBC: 9.3 10*3/uL (ref 4.0–10.5)
nRBC: 0 % (ref 0.0–0.2)

## 2022-02-12 LAB — TROPONIN I (HIGH SENSITIVITY)
Troponin I (High Sensitivity): 5 ng/L (ref <18)
Troponin I (High Sensitivity): 7 ng/L (ref <18)

## 2022-02-12 LAB — LIPASE, BLOOD: Lipase: 48 U/L (ref 11–51)

## 2022-02-12 MED ORDER — IOHEXOL 300 MG/ML  SOLN
100.0000 mL | Freq: Once | INTRAMUSCULAR | Status: AC | PRN
  Administered 2022-02-12: 100 mL via INTRAVENOUS

## 2022-02-12 MED ORDER — ONDANSETRON 4 MG PO TBDP: ORAL_TABLET | ORAL | 0 refills | Status: DC

## 2022-02-12 MED ORDER — SUCRALFATE 1 GM/10ML PO SUSP: 1.0000 g | Freq: Three times a day (TID) | ORAL | 0 refills | Status: DC

## 2022-02-12 MED ORDER — LIDOCAINE VISCOUS HCL 2 % MT SOLN
15.0000 mL | Freq: Once | OROMUCOSAL | Status: AC
  Administered 2022-02-12: 15 mL via OROMUCOSAL
  Filled 2022-02-12: qty 15

## 2022-02-12 MED ORDER — IPRATROPIUM-ALBUTEROL 0.5-2.5 (3) MG/3ML IN SOLN
3.0000 mL | Freq: Once | RESPIRATORY_TRACT | Status: AC
  Administered 2022-02-12: 3 mL via RESPIRATORY_TRACT
  Filled 2022-02-12: qty 3

## 2022-02-12 NOTE — ED Notes (Signed)
Pt taken off oxygen, SpO2 96% on RA

## 2022-02-12 NOTE — ED Notes (Signed)
Pt ambulated independently to bathroom.

## 2022-02-12 NOTE — Discharge Instructions (Addendum)
Increase your Prilosec to twice a day for at least 1 week.  Take Pepcid 20 mg twice a day as well.

## 2022-02-14 ENCOUNTER — Telehealth: Payer: Self-pay | Admitting: Registered Nurse

## 2022-02-14 DIAGNOSIS — J4541 Moderate persistent asthma with (acute) exacerbation: Secondary | ICD-10-CM

## 2022-02-14 DIAGNOSIS — K920 Hematemesis: Secondary | ICD-10-CM

## 2022-02-14 NOTE — Telephone Encounter (Signed)
Patient came to clinic as had nausea then vomiting blood this weekend went to ER had workup and discharged with rx for sucrulfate and zofran.  Patient using both and helped to stop vomiting.  PCM had put in GI referral for her but still hasn't been able to schedule with [facility] keeps getting voicemail.  Patient also having wheezing and albuterol inhaler wearing off before 4 hours.  Patient wanting to know if she can get prednisone or steroid shot.  ER provider refused to give her steroid shot as stated could worsen hematemesis.  Patient reported had one neb treatment in ER.  Has follow up appt scheduled with PCM later this week.   Patient to use albuterol inhaler 2 puffs now as 3.5 hours since last albuterol dose.  Stay in air-conditioning as much as possible as humidity and heat index warning for today.  BBS with faint wheeze inspiration beginning of breath; sp02 98% RA; bilateral allergic shiners; no cough observed in clinic spoke full sentences without difficulty; skin warm dry and pink respirations even and unlabored gait sure and steady in clinic; some epigastric pain noted by patient "my GERD acting up/still recovering from throwing up.  ER provider discussed with me possible gastric ulcer cause of bleeding this weekend"  Patient verbalized understanding information/instructions, agreed with plan of care and had no further questions at this time.  Contacted [facility] office with patient and was on hold/patient had to return to work.  She stated to schedule first available appt with provider and notify her of date and time.  Appt scheduled 30 Aug at 1040 with Dr Justice Rocher [facility] [place] telephone number [phone].  Message for patient with appt date/time/address/provider left for patient at workcenter.  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  RN Theodis Sato also given appt information to discuss with patient if she returns to clinic today after NP  clinic ended.
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
## 2022-02-21 ENCOUNTER — Telehealth: Payer: Self-pay | Admitting: Registered Nurse

## 2022-02-21 ENCOUNTER — Encounter: Payer: Self-pay | Admitting: Registered Nurse

## 2022-02-21 DIAGNOSIS — J4541 Moderate persistent asthma with (acute) exacerbation: Secondary | ICD-10-CM

## 2022-02-21 MED ORDER — ALBUTEROL SULFATE HFA 108 (90 BASE) MCG/ACT IN AERS: INHALATION_SPRAY | RESPIRATORY_TRACT | 1 refills | Status: DC

## 2022-02-22 ENCOUNTER — Encounter: Payer: Self-pay | Admitting: Registered Nurse

## 2022-02-22 NOTE — Telephone Encounter (Signed)
Patient seen in workcenter.  She requested albuterol MDI refill as running low.  Had been using every 3-4 hours last week and improving to 6 hours until Sunday when worsening again and back to 2 puffs every 4 hours.  Discussed with patient heat index over 100 degrees, humidity high, barometric pressure changes with thunderstorms all can affect breathing/cause asthma exacerbation.  Patient denied new symptoms e.g. fever/chills/n/v/d/hemoptysis.  Cough typicaly nonproductive.  Epigastric/stomach pain improved but not completely resolved also.  Has appt scheduled with PCM next week.  Denied need for appt with me today.  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  Gait sure and steady A&Ox3.  No cough/throat clearing/dyspnea/shortness of breath or wheezing observed. Electronic Rx sent to her pharmacy of choice albuterol 121mg/act 2p po q6h prn chest tightness/wheezing #18 RF1 Patient verbalized understanding information/instructions, agreed with plan of care and had no further questions at this time.

## 2022-02-23 MED ORDER — ALBUTEROL SULFATE HFA 108 (90 BASE) MCG/ACT IN AERS: 2.0000 | INHALATION_SPRAY | RESPIRATORY_TRACT | 0 refills | Status: DC | PRN

## 2022-02-23 NOTE — Telephone Encounter (Signed)
Patient contacted via telephone preferred [facility] [place].  Electronic Rx sent albuterol MDI 11mg/act 2p po q4h prn sob/wheeze #1 8g RF0 [place] [facility] [place] [facility] [place] [facility] [place] [facility] [place]  Patient verbalized understanding information and had no further questions at this time.

## 2022-02-24 ENCOUNTER — Ambulatory Visit: Payer: No Typology Code available for payment source | Admitting: Internal Medicine

## 2022-02-24 ENCOUNTER — Encounter: Payer: Self-pay | Admitting: Internal Medicine

## 2022-02-24 VITALS — BP 160/98 | HR 83 | Temp 98.0°F | Ht 71.0 in | Wt 218.0 lb

## 2022-02-24 DIAGNOSIS — J4531 Mild persistent asthma with (acute) exacerbation: Secondary | ICD-10-CM | POA: Diagnosis not present

## 2022-02-24 MED ORDER — METHYLPREDNISOLONE ACETATE 40 MG/ML IJ SUSP
40.0000 mg | Freq: Once | INTRAMUSCULAR | Status: AC
  Administered 2022-02-24: 40 mg via INTRAMUSCULAR

## 2022-02-24 MED ORDER — TRULICITY 3 MG/0.5ML ~~LOC~~ SOAJ: 3.0000 mg | SUBCUTANEOUS | 3 refills | Status: DC

## 2022-02-24 MED ORDER — DOXYCYCLINE HYCLATE 100 MG PO TABS: 100.0000 mg | ORAL_TABLET | Freq: Two times a day (BID) | ORAL | 0 refills | Status: DC

## 2022-02-24 NOTE — Patient Instructions (Signed)
We have sent in the higher dose of trulicity to start when you run out.  We have done the steroid shot and have sent in doxycycline to take 1 pill twice a day for 1 week.

## 2022-02-24 NOTE — Assessment & Plan Note (Signed)
Wheezing and rhonchi on exam with persistent productive cough. CXR from ER not helpful with low lung volumes and portable. Will treat with depo-medrol 40 mg IM and doxycycline 1 week course.

## 2022-02-24 NOTE — Progress Notes (Signed)
   Subjective:   Patient ID: Kelly Reed, female    DOB: 01-Oct-1965, 56 y.o.   MRN: [id number]  HPI The patient is a 56 YO female coming in for recent asthma flare. Not treated for this at ER after extended visit. Still having to use albuterol 3-4 times per day. Stomach is improving. SOB is present and productive cough is still present and present 2-3 weeks.   Review of Systems  Constitutional:  Positive for activity change.  HENT:  Positive for congestion and voice change.   Eyes: Negative.   Respiratory:  Positive for cough, shortness of breath and wheezing. Negative for chest tightness.   Cardiovascular:  Negative for chest pain, palpitations and leg swelling.  Gastrointestinal:  Negative for abdominal distention, abdominal pain, constipation, diarrhea, nausea and vomiting.  Musculoskeletal: Negative.   Skin: Negative.   Neurological: Negative.   Psychiatric/Behavioral: Negative.      Objective:  Physical Exam Constitutional:      Appearance: She is well-developed.  HENT:     Head: Normocephalic and atraumatic.  Cardiovascular:     Rate and Rhythm: Normal rate and regular rhythm.  Pulmonary:     Effort: Pulmonary effort is normal. No respiratory distress.     Breath sounds: Wheezing and rhonchi present. No rales.  Abdominal:     General: Bowel sounds are normal. There is no distension.     Palpations: Abdomen is soft.     Tenderness: There is no abdominal tenderness. There is no rebound.  Musculoskeletal:     Cervical back: Normal range of motion.  Skin:    General: Skin is warm and dry.  Neurological:     Mental Status: She is alert and oriented to person, place, and time.     Coordination: Coordination normal.     Vitals:   02/24/22 0939 02/24/22 0947  BP: (!) 160/102 (!) 160/98  Pulse: 83   Temp: 98 F (36.7 C)   TempSrc: Oral   SpO2: 93%   Weight: 218 lb (98.9 kg)   Height: '5\' 11"'$  (1.803 m)     Assessment & Plan:  Depo-medrol 40 mg IM given at visit

## 2022-03-01 ENCOUNTER — Encounter: Payer: Self-pay | Admitting: Registered Nurse

## 2022-03-01 ENCOUNTER — Telehealth: Payer: Self-pay | Admitting: Registered Nurse

## 2022-03-01 NOTE — Telephone Encounter (Signed)
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  Her voice hoarse but stated she is feeling better.  Had appt with PCM on 02/24/22 given steroid shot and doxycycline '100mg'$  po BID.  Taking without difficulty/not upsetting stomach.  Patient reported had to use albuterol inhaler every 4 hours on Saturday but only once on Sunday.  Worked Monday and due to dust at work using albuterol more often again.  Discussed mask wear at work and patient stated breathing through mask worsens her breathing symptoms.  Discussed nasal saline use prn at work/may come to clinic for bottle if she wants from clinic stock.  Shower after work/prior to bed.  Stay inside if heat index greater than 100.  No cough observed during 5 minute conversation/spoke full sentences without difficulty/skin warm dry and pink/respirations even and unlabored.  Patient verbalized understanding information/instructions and had no further questions at this time.

## 2022-03-02 ENCOUNTER — Ambulatory Visit: Payer: No Typology Code available for payment source | Admitting: Registered Nurse

## 2022-03-02 ENCOUNTER — Encounter: Payer: Self-pay | Admitting: Registered Nurse

## 2022-03-02 VITALS — HR 70 | Temp 98.2°F | Resp 18

## 2022-03-02 DIAGNOSIS — R0981 Nasal congestion: Secondary | ICD-10-CM

## 2022-03-02 DIAGNOSIS — R490 Dysphonia: Secondary | ICD-10-CM

## 2022-03-02 MED ORDER — SALINE SPRAY 0.65 % NA SOLN: 2.0000 | NASAL | 0 refills | Status: DC

## 2022-03-02 MED ORDER — FLUTICASONE PROPIONATE 50 MCG/ACT NA SUSP: 1.0000 | Freq: Two times a day (BID) | NASAL | 6 refills | Status: DC

## 2022-03-02 NOTE — Progress Notes (Signed)
Subjective:    Patient ID: Kelly Reed, female    DOB: 1965-12-21, 56 y.o.   MRN: [id number]  55y/o caucasian female married established patient here for re-evaluation lungs/sp02 as hoarse voice/nasal congestion/rhinitis wondering if she can still have allergy shots tomorrow.  Denied fever/chills/n/v/d/body aches.  Still using her albuterol inhaler prn.  So far today just used her regular daily inhaler hasn't needed rescue inhaler today.  Last saw PCM 18 Aug given steroid shot and doxycycline '100mg'$  po BID rx.      Review of Systems  Constitutional:  Negative for chills and fever.  HENT:  Positive for congestion, postnasal drip and rhinorrhea. Negative for sinus pressure, sinus pain, sneezing, sore throat, trouble swallowing and voice change.   Eyes:  Negative for photophobia and visual disturbance.  Respiratory:  Negative for cough, shortness of breath, wheezing and stridor.   Gastrointestinal:  Negative for diarrhea, nausea and vomiting.  Endocrine: Negative for cold intolerance and heat intolerance.  Genitourinary:  Negative for difficulty urinating.  Musculoskeletal:  Negative for gait problem, neck pain and neck stiffness.  Skin:  Negative for rash.  Allergic/Immunologic: Positive for environmental allergies.  Neurological:  Negative for dizziness, tremors, seizures, syncope, facial asymmetry, speech difficulty, weakness, light-headedness and headaches.  Hematological:  Negative for adenopathy. Does not bruise/bleed easily.  Psychiatric/Behavioral:  Negative for agitation, confusion and sleep disturbance.        Objective:   Physical Exam Vitals and nursing note reviewed.  Constitutional:      General: She is awake. She is not in acute distress.    Appearance: Normal appearance. She is well-developed, well-groomed and overweight. She is not ill-appearing, toxic-appearing or diaphoretic.  HENT:     Head: Normocephalic and atraumatic.     Jaw: There is normal jaw occlusion.      Salivary Glands: Right salivary gland is not diffusely enlarged. Left salivary gland is not diffusely enlarged.     Right Ear: Hearing and external ear normal.     Left Ear: Hearing and external ear normal.     Nose: Nose normal. No congestion or rhinorrhea.     Right Turbinates: Enlarged and swollen. Not pale.     Left Turbinates: Enlarged and swollen. Not pale.     Right Sinus: No maxillary sinus tenderness or frontal sinus tenderness.     Left Sinus: No maxillary sinus tenderness or frontal sinus tenderness.     Mouth/Throat:     Lips: Pink. No lesions.     Mouth: Mucous membranes are moist. No oral lesions or angioedema.     Dentition: No gum lesions.     Tongue: No lesions. Tongue does not deviate from midline.     Palate: No mass and lesions.     Pharynx: Uvula midline. Pharyngeal swelling and posterior oropharyngeal erythema present. No oropharyngeal exudate or uvula swelling.     Tonsils: No tonsillar exudate. 0 on the right. 0 on the left.     Comments: Hoarse voice; nasal congestion and sniffing audible in exam room; bilateral allergic shiners;  Eyes:     General: Lids are normal. Vision grossly intact. Gaze aligned appropriately. No scleral icterus.       Right eye: No discharge.        Left eye: No discharge.     Extraocular Movements: Extraocular movements intact.     Conjunctiva/sclera: Conjunctivae normal.     Pupils: Pupils are equal, round, and reactive to light.  Neck:     Trachea:  Trachea normal.  Cardiovascular:     Rate and Rhythm: Normal rate and regular rhythm.     Pulses:          Radial pulses are 2+ on the right side and 2+ on the left side.     Heart sounds: Normal heart sounds, S1 normal and S2 normal.  Pulmonary:     Effort: Pulmonary effort is normal.     Breath sounds: Normal breath sounds and air entry. No stridor, decreased air movement or transmitted upper airway sounds. No decreased breath sounds, wheezing, rhonchi or rales.     Comments:  Spoke full sentences without difficulty; no cough observed in exam room Abdominal:     General: Abdomen is flat.  Musculoskeletal:        General: Normal range of motion.     Cervical back: Normal range of motion and neck supple.     Right lower leg: No edema.     Left lower leg: No edema.  Lymphadenopathy:     Head:     Right side of head: No submandibular or preauricular adenopathy.     Left side of head: No submandibular or preauricular adenopathy.     Cervical:     Right cervical: No superficial, deep or posterior cervical adenopathy.    Left cervical: No superficial, deep or posterior cervical adenopathy.  Skin:    General: Skin is warm and dry.     Capillary Refill: Capillary refill takes less than 2 seconds.     Coloration: Skin is not ashen, cyanotic, jaundiced, mottled, pale or sallow.     Findings: No abrasion, bruising, burn, erythema, signs of injury, laceration, petechiae, rash or wound.  Neurological:     General: No focal deficit present.     Mental Status: She is alert and oriented to person, place, and time. Mental status is at baseline.     GCS: GCS eye subscore is 4. GCS verbal subscore is 5. GCS motor subscore is 6.     Cranial Nerves: Cranial nerves 2-12 are intact. No cranial nerve deficit, dysarthria or facial asymmetry.     Motor: Motor function is intact. No weakness, tremor, atrophy, abnormal muscle tone or seizure activity.     Coordination: Coordination is intact. Coordination normal.     Gait: Gait is intact. Gait normal.     Comments: In/out of chair without difficulty; gait sure and steady in clinic; bilateral hand grasp equal 5/5  Psychiatric:        Attention and Perception: Attention and perception normal.        Mood and Affect: Mood and affect normal.        Speech: Speech normal.        Behavior: Behavior normal. Behavior is cooperative.        Thought Content: Thought content normal.        Cognition and Memory: Cognition and memory normal.         Judgment: Judgment normal.           Assessment & Plan:   A-hoarseness of voice, nasal congestion  P-Discussed with patient afebrile, not needing albuterol inhaler yet today, rhinitis.  Should be able to continue with allergy immunotherapy tomorrow but notify her provider flare up of acute rhinitis.  Nasal saline given from clinic stock 2 sprays each nostril q2h prn congestion/dust exposure.  If worsening symptoms eg sore throat, loss taste/smell, fever, chills, n/v/d, body aches consider home covid testing as increasing cases in community.  Reassured patient BBS CTA and sp02 99% RA.  Noted to have rhinitis nostrils and post nasal drip most likely cause of congestion/vocal cord irritation  mucous clear yellow typical with allergic rhinitis or viral rhinitis.  Follow up re-evaluation prn new or worsening symptoms.  Patient verbalized understanding information/instructions, agreed with plan of care and had no further questions at this time.

## 2022-03-02 NOTE — Patient Instructions (Signed)
How to Perform a Sinus Rinse A sinus rinse is a home treatment that is used to rinse your sinuses with a germ-free (sterile) mixture of salt and water (saline solution). Sinuses are air-filled spaces in your skull that are behind the bones of your face and forehead. They open into your nasal cavity. A sinus rinse can help to clear mucus, dirt, dust, or pollen from your nasal cavity. You may do a sinus rinse when you have a cold, a virus, nasal allergy symptoms, a sinus infection, or stuffiness in your nose or sinuses. What are the risks? A sinus rinse is generally safe and effective. However, there are a few risks, which include: A burning sensation in your sinuses. This may happen if you do not make the saline solution as directed. Be sure to follow all directions when making the saline solution. Nasal irritation. Infection. This may be from unclean supplies or from contaminated water. Infection from contaminated water is rare, but possible. Do not do a sinus rinse if you have had ear or nasal surgery, ear infection, or plugged ears, unless recommended by your health care provider. Supplies needed: Saline solution or powder. Distilled or sterile water to mix with saline powder. You may use boiled and cooled tap water. Boil tap water for 5 minutes; cool until it is lukewarm. Use within 24 hours. Do not use regular tap water to mix with the saline solution. Neti pot or nasal rinse bottle. These supplies release the saline solution into your nose and through your sinuses. Neti pots and nasal rinse bottles can be purchased at [employer], a health food store, or online. How to perform a sinus rinse  Wash your hands with soap and water for at least 20 seconds. If soap and water are not available, use hand sanitizer. Wash your device according to the directions that came with the product and then dry it. Use the solution that comes with your product or one that is sold separately in stores.  Follow the mixing directions on the package to mix with sterile or distilled water. Fill the device with the amount of saline solution noted in the device instructions. Stand by a sink and tilt your head sideways over the sink. Place the spout of the device in your upper nostril (the one closer to the ceiling). Gently pour or squeeze the saline solution into your nasal cavity. The liquid should drain out from the lower nostril if you are not too congested. While rinsing, breathe through your open mouth. Gently blow your nose to clear any mucus and rinse solution. Blowing too hard may cause ear pain. Turn your head in the other direction and repeat in your other nostril. Clean and rinse your device with clean water and then air-dry it. Talk with your health care provider or pharmacist if you have questions about how to do a sinus rinse. Summary A sinus rinse is a home treatment that is used to rinse your sinuses with a sterile mixture of salt and water (saline solution). You may do a sinus rinse when you have a cold, a virus, nasal allergy symptoms, a sinus infection, or stuffiness in your nose or sinuses. A sinus rinse is generally safe and effective. Follow all instructions carefully. This information is not intended to replace advice given to you by your health care provider. Make sure you discuss any questions you have with your health care provider. Document Revised: 12/13/2020 Document Reviewed: 12/13/2020 Elsevier Patient Education  [place]. Nonallergic  Rhinitis Nonallergic rhinitis is inflammation of the mucous membrane inside the nose. The mucous membrane is the tissue that produces mucus. This condition is different from having allergic rhinitis, which is an allergy that affects the nose. Allergic rhinitis occurs when the body's defense system, or immune system, reacts to a substance that a person is allergic to (allergen), such as pollen, pet dander, mold, or dust. Nonallergic  rhinitis has many similar symptoms, but it is not caused by allergens. Nonallergic rhinitis can be an acute or chronic problem. This means it can be short-term or long-term. What are the causes? This condition may be caused by many different things. Some common types of nonallergic rhinitis include: Infectious rhinitis. This is usually caused by an infection in the nose, throat, or upper airways (upper respiratory system). Vasomotor rhinitis. This is the most common type of chronic nonallergic rhinitis. It is caused by too much blood flow through your nose, and it leads to swelling in your nose. It is triggered by strong odors, cold air, stress, drinking alcohol, cigarette smoke, or changes in the weather. Occupational rhinitis. This type is caused by triggers in the workplace, such as chemicals, dust, animal dander, or air pollution. Hormonal rhinitis, in teenage girls and women. This type is caused by an increase in the hormone estrogen and may happen during pregnancy, puberty, or monthly menstrual periods. Hormonal rhinitis gives you fewer symptoms when estrogen levels drop. Drug-induced rhinitis. Several types of medicines can cause this, such as medicines for high blood pressure or heart disease, aspirin, or NSAIDs. Nonallergic rhinitis with eosinophilia syndrome (NARES). This type is caused by having too much eosinophil, a type of white blood cell. Other causes include a reaction to eating hot or spicy foods. This does not usually cause long-term symptoms. In some cases, the cause of nonallergic rhinitis is not known. What increases the risk? You are more likely to develop this condition if: You are 75-66 years of age. You are a woman. Women are twice as likely to have this condition. What are the signs or symptoms? Common symptoms of this condition include: Stuffy nose (nasal congestion). Runny nose. A feeling of mucus dripping down the back of your throat (postnasal drip). Trouble  sleeping. Tiredness, or fatigue. Other symptoms include: Sneezing. Coughing. Itchy nose. Bloodshot eyes. How is this diagnosed? This type may be diagnosed based on: Your symptoms and medical history. A physical exam. Allergy testing to rule out allergic rhinitis. You may have skin tests or blood tests. Your health care provider may also take a swab of nasal discharge to look for an increased number of eosinophils. This would be done to confirm a diagnosis of NARES. How is this treated? Treatment for this condition depends on the cause. No single treatment works for everyone. Work with your health care provider to find the best treatment for you. Treatment may include: Avoiding the things that trigger your symptoms. Medicines to relieve congestion, such as: Steroid nasal spray. There are many types. You may need to try a few to find out which one works best. Best boy medicine. This treats nasal congestion and may be given by mouth or as a nasal spray. These medicines are used only for a short time. Medicines to relieve a runny nose. These may include antihistamine medicines or anticholinergic nasal sprays. Nasal irrigation. This involves using a salt-water (saline) spray or saline container called a neti pot. Nasal irrigation helps to clear away mucus and keep your nasal passages moist. Surgery to remove part  of your mucous membrane. This is done in severe cases if the condition has not improved after 6-12 months of treatment. Follow these instructions at home: Medicines Take or use over-the-counter and prescription medicines only as told by your health care provider. Do not stop using your medicine even if you start to feel better. Do not take NSAIDs, such as ibuprofen, or medicines that contain aspirin if they make your symptoms worse. Lifestyle Do not drink alcohol if it makes your symptoms worse. Do not use any products that contain nicotine or tobacco, such as cigarettes,  e-cigarettes, and chewing tobacco. If you need help quitting, ask your health care provider. Avoid secondhand smoke. General instructions Avoid triggers that make your symptoms worse. Use nasal irrigation as told by your health care provider. Get exercise. Exercise may help reduce symptoms for some people. Sleep with the head of your bed raised. This may reduce nasal congestion when you sleep. Drink enough fluid to keep your urine pale yellow. Keep all follow-up visits as told by your health care provider. This is important. Contact a health care provider if: You have a fever. Your symptoms are getting worse at home. Your symptoms do not lessen with medicine. You develop new symptoms, especially a headache or nosebleed. Summary Nonallergic rhinitis is inflammation inside the nose that is not caused by allergens. Nonallergic rhinitis can be a short-term or long-term problem. Treatment may include avoiding the things that trigger your symptoms. Take or use over-the-counter and prescription medicines only as told by your health care provider. Do not stop using your medicine even if you start to feel better. Contact a health care provider if your symptoms do not lessen with medicine. This information is not intended to replace advice given to you by your health care provider. Make sure you discuss any questions you have with your health care provider. Document Revised: 05/05/2019 Document Reviewed: 05/05/2019 Elsevier Patient Education  [place]. Allergic Rhinitis, Adult  Allergic rhinitis is an allergic reaction that affects the mucous membrane inside the nose. The mucous membrane is the tissue that produces mucus. There are two types of allergic rhinitis: Seasonal. This type is also called hay fever and happens only during certain seasons. Perennial. This type can happen at any time of the year. Allergic rhinitis cannot be spread from person to person. This condition can be mild,  moderate, or severe. It can develop at any age and may be outgrown. What are the causes? This condition is caused by allergens. These are things that can cause an allergic reaction. Allergens may differ for seasonal allergic rhinitis and perennial allergic rhinitis. Seasonal allergic rhinitis is triggered by pollen. Pollen can come from grasses, trees, and weeds. Perennial allergic rhinitis may be triggered by: Dust mites. Proteins in a pet's urine, saliva, or dander. Dander is dead skin cells from a pet. Smoke, mold, or car fumes. What increases the risk? You are more likely to develop this condition if you have a family history of allergies or other conditions related to allergies, including: Allergic conjunctivitis. This is inflammation of parts of the eyes and eyelids. Asthma. This condition affects the lungs and makes it hard to breathe. Atopic dermatitis or eczema. This is long term (chronic) inflammation of the skin. Food allergies. What are the signs or symptoms? Symptoms of this condition include: Sneezing or coughing. A stuffy nose (nasal congestion), itchy nose, or nasal discharge. Itchy eyes and tearing of the eyes. A feeling of mucus dripping down the back of  your throat (postnasal drip). Trouble sleeping. Tiredness or fatigue. Headache. Sore throat. How is this diagnosed? This condition may be diagnosed with your symptoms, medical history, and physical exam. Your health care provider may check for related conditions, such as: Asthma. Pink eye. This is eye inflammation caused by infection (conjunctivitis). Ear infection. Upper respiratory infection. This is an infection in the nose, throat, or upper airways. You may also have tests to find out which allergens trigger your symptoms. These may include skin tests or blood tests. How is this treated? There is no cure for this condition, but treatment can help control symptoms. Treatment may include: Taking medicines that  block allergy symptoms, such as corticosteroids and antihistamines. Medicine may be given as a shot, nasal spray, or pill. Avoiding any allergens. Being exposed again and again to tiny amounts of allergens to help you build a defense against allergens (immunotherapy). This is done if other treatments have not helped. It may include: Allergy shots. These are injected medicines that have small amounts of allergen in them. Sublingual immunotherapy. This involves taking small doses of a medicine with allergen in it under your tongue. If these treatments do not work, your health care provider may prescribe newer, stronger medicines. Follow these instructions at home: Avoiding allergens Find out what you are allergic to and avoid those allergens. These are some things you can do to help avoid allergens: If you have perennial allergies: Replace carpet with wood, tile, or vinyl flooring. Carpet can trap dander and dust. Do not smoke. Do not allow smoking in your home. Change your heating and air conditioning filters at least once a month. If you have seasonal allergies, take these steps during allergy season: Keep windows closed as much as possible. Plan outdoor activities when pollen counts are lowest. Check pollen counts before you plan outdoor activities. When coming indoors, change clothing and shower before sitting on furniture or bedding. If you have a pet in the house that produces allergens: Keep the pet out of the bedroom. Vacuum, sweep, and dust regularly. General instructions Take over-the-counter and prescription medicines only as told by your health care provider. Drink enough fluid to keep your urine pale yellow. Keep all follow-up visits as told by your health care provider. This is important. Where to find more information American Academy of Allergy, Asthma & Immunology: www.aaaai.org Contact a health care provider if: You have a fever. You develop a cough that does not go  away. You make whistling sounds when you breathe (wheeze). Your symptoms slow you down or stop you from doing your normal activities each day. Get help right away if: You have shortness of breath. This symptom may represent a serious problem that is an emergency. Do not wait to see if the symptom will go away. Get medical help right away. Call your local emergency services (911 in the [place] Do not drive yourself to the hospital. Summary Allergic rhinitis may be managed by taking medicines as directed and avoiding allergens. If you have seasonal allergies, keep windows closed as much as possible during allergy season. Contact your health care provider if you develop a fever or a cough that does not go away. This information is not intended to replace advice given to you by your health care provider. Make sure you discuss any questions you have with your health care provider. Document Revised: 08/15/2019 Document Reviewed: 06/24/2019 Elsevier Patient Education  [place] Prevention, Adult Although you may not be able to change the  fact that you have asthma, you can take actions to prevent episodes of asthma (asthma attacks). How can this condition affect me? Asthma attacks (flare ups) can cause trouble breathing, high-pitched whistling sounds when you breathe, most often when you breathe out (wheeze), and coughing. They may keep you from doing activities you like to do. What can increase my risk? Coming into contact with things that cause asthma symptoms (asthma triggers) can put you at risk for an asthma attack. Common asthma triggers include: Things you are allergic to (allergens), such as: Dust mite and cockroach droppings. Pet dander. Mold. Pollen from trees and grasses. Food allergies. This might be a specific food or added chemicals called sulfites. Irritants, such as: Weather changes including very cold, dry, or humid air. Smoke. This includes campfire smoke,  air pollution, and tobacco smoke. Strong odors from aerosol sprays and fumes from perfume, candles, and household cleaners. Other triggers, such as: Certain medicines. This includes NSAIDs, such as ibuprofen and aspirin. Viral respiratory infections (colds), including runny nose (rhinitis) or infection in the sinuses (sinusitis). Activity, including exercise, laughing, or crying. Not using inhaled medicines (corticosteroids) as told. What actions can I take to prevent an asthma attack? Stay healthy. Stay up to date on all immunizations as told by your health care provider, including the yearly flu (influenza) vaccine and pneumonia vaccine. Many asthma attacks can be prevented by carefully following your written asthma action plan. Follow your asthma action plan Work with your health care provider to create a written asthma action plan. This plan should include: A list of your asthma triggers and how to avoid or reduce them. A list of symptoms that you may have during an asthma attack. Information about which medicine to take, when to take the medicine, and how much of the medicine to take. Information to help you understand your peak flow measurements. Daily actions that you can take to prevent (control) your asthma symptoms. Contact information for your health care providers. If you have an asthma attack, act quickly. Follow the emergency steps on your written asthma action plan. This may prevent you from needing to go to the hospital. Monitor your asthma. To do this: Use your peak flow meter every morning and every evening for 2-3 weeks or as told by your health care provider. Record the results in a journal. A drop in your peak flow numbers on one or more days may mean that you are starting to have an asthma attack, even if you are not having symptoms. When you have asthma symptoms, write them down in a journal. Write down in your journal how often you need to use your fast-acting rescue  inhaler. If you are using your rescue inhaler more often, it may mean that your asthma is not under control. Talk with your health care provider about adjusting your asthma treatment plan to help you prevent future asthma attacks and gain better control of your condition.  Lifestyle Avoid or reduce contact with known outdoor allergens by staying indoors, keeping windows closed, and using air conditioning when pollen and mold counts are high. Do not use any products that contain nicotine or tobacco. These products include cigarettes, chewing tobacco, and vaping devices, such as e-cigarettes. If you need help quitting, ask your health care provider. If you are overweight, consider a weight-loss plan. Find ways to cope with stress and your feelings, such as mindfulness, relaxation, or breathing exercises. Ask your health care provider if a breathing exercise program (pulmonary rehabilitation) may be  helpful to control symptoms and improve your quality of life. Medicines  Take over-the-counter and prescription medicines only as told by your health care provider. Do not stop taking your medicine and do not take less medicine even if you start to feel better. Let your health care provider know: How often you use your rescue inhaler. How often you have symptoms when you are taking your regular medicines. If you wake up at night because of asthma symptoms. If you have more trouble with your breathing when you exercise. Activity Do your normal activities as told by your health care provider. Ask your health care provider what activities are safe for you. Some people have asthma symptoms or more asthma symptoms when they exercise. This is called exercise-induced bronchoconstriction (EIB). If you have this problem, talk with your health care provider about how to manage EIB. Some tips to follow include: Use your fast-acting inhaler before exercise. Exercise indoors if it is very cold, humid, or the pollen  and mold counts are high. Warm up and cool down before and after exercise. Stop exercising right away if your asthma symptoms start or get worse. Where to find more information Asthma and Allergy Foundation of America: www.aafa.org Centers for Disease Control and Prevention: http://www.wolf.info/ American Lung Association: www.lung.org National Heart, Lung, and Blood Institute: https://wilson-eaton.com/ World Health Organization: RoleLink.com.br Get help right away if: You have followed your written asthma action plan and your symptoms are not improving. Summary Asthma attacks (flare ups) can cause trouble breathing, high-pitched whistling sounds when you breathe, most often when you breathe out (wheeze), and coughing. Work with your health care provider to create a written asthma action plan. Do not stop taking your medicine and do not take less medicine even if you are feeling better. Do not use any products that contain nicotine or tobacco. These products include cigarettes, chewing tobacco, and vaping devices, such as e-cigarettes. If you need help quitting, ask your health care provider. This information is not intended to replace advice given to you by your health care provider. Make sure you discuss any questions you have with your health care provider. Document Revised: 12/22/2020 Document Reviewed: 12/22/2020 Elsevier Patient Education  [place].

## 2022-03-03 ENCOUNTER — Ambulatory Visit: Payer: No Typology Code available for payment source | Admitting: Internal Medicine

## 2022-03-03 NOTE — Telephone Encounter (Signed)
Patient picked up refill no further needs at this time.  Seen in clinic 03/02/22.

## 2022-03-08 ENCOUNTER — Encounter: Payer: Self-pay | Admitting: Gastroenterology

## 2022-03-08 ENCOUNTER — Ambulatory Visit: Payer: No Typology Code available for payment source | Admitting: Gastroenterology

## 2022-03-08 VITALS — BP 164/88 | HR 82 | Ht 71.0 in | Wt 220.6 lb

## 2022-03-08 DIAGNOSIS — K219 Gastro-esophageal reflux disease without esophagitis: Secondary | ICD-10-CM

## 2022-03-08 DIAGNOSIS — R1310 Dysphagia, unspecified: Secondary | ICD-10-CM | POA: Diagnosis not present

## 2022-03-08 DIAGNOSIS — K760 Fatty (change of) liver, not elsewhere classified: Secondary | ICD-10-CM

## 2022-03-08 DIAGNOSIS — R1011 Right upper quadrant pain: Secondary | ICD-10-CM

## 2022-03-08 DIAGNOSIS — R748 Abnormal levels of other serum enzymes: Secondary | ICD-10-CM
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
## 2022-03-08 NOTE — Patient Instructions (Addendum)
If you are age 58 or younger, your body mass index should be between 19-25. Your Body mass index is 30.77 kg/m. If this is out of the aformentioned range listed, please consider follow up with your Primary Care Provider.   __________________________________________________________  The [facility] providers would like to encourage you to use [facility] to communicate with providers for non-urgent requests or questions.  Due to long hold times on the telephone, sending your provider a message by [place] may be a faster and more efficient way to get a response.  Please allow 48 business hours for a response.  Please remember that this is for non-urgent requests.   Due to recent changes in healthcare laws, you may see the results of your imaging and laboratory studies on MyChart before your provider has had a chance to review them.  We understand that in some cases there may be results that are confusing or concerning to you. Not all laboratory results come back in the same time frame and the provider may be waiting for multiple results in order to interpret others.  Please give [place] 48 hours in order for your provider to thoroughly review all the results before contacting the office for clarification of your results.   You have been scheduled for an endoscopy. Please follow written instructions given to you at your visit today. If you use inhalers (even only as needed), please bring them with you on the day of your procedure. Hold your   Trulicity 3 days prior to your procedure.  You have been scheduled for a HIDA scan at [facility] (1st floor) on 03/15/22. Please arrive 30 minutes prior to your scheduled appointment at  637 am. Make certain not to have anything to eat or drink at least 6 hours prior to your test. Should this appointment date or time not work well for you, please call radiology scheduling at [phone].   _____________________________________________________________________ hepatobiliary (HIDA) scan is an imaging procedure used to diagnose problems in the liver, gallbladder and bile ducts. In the HIDA scan, a radioactive chemical or tracer is injected into a vein in your arm. The tracer is handled by the liver like bile. Bile is a fluid produced and excreted by your liver that helps your digestive system break down fats in the foods you eat. Bile is stored in your gallbladder and the gallbladder releases the bile when you eat a meal. A special nuclear medicine scanner (gamma camera) tracks the flow of the tracer from your liver into your gallbladder and small intestine.  During your HIDA scan  You'll be asked to change into a hospital gown before your HIDA scan begins. Your health care team will position you on a table, usually on your back. The radioactive tracer is then injected into a vein in your arm.The tracer travels through your bloodstream to your liver, where it's taken up by the bile-producing cells. The radioactive tracer travels with the bile from your liver into your gallbladder and through your bile ducts to your small intestine.You may feel some pressure while the radioactive tracer is injected into your vein. As you lie on the table, a special gamma camera is positioned over your abdomen taking pictures of the tracer as it moves through your body. The gamma camera takes pictures continually for about an hour. You'll need to keep still during the HIDA scan. This can become uncomfortable, but you may find that you can lessen the discomfort by taking deep breaths and thinking about other  things. Tell your health care team if you're uncomfortable. The radiologist will watch on a computer the progress of the radioactive tracer through your body. The HIDA scan may be stopped when the radioactive tracer is seen in the gallbladder and enters your small intestine. This typically takes about an hour. In  some cases extra imaging will be performed if original images aren't satisfactory, if morphine is given to help visualize the gallbladder or if the medication CCK is given to look at the contraction of the gallbladder. This test typically takes 2 hours to complete.   ________________________________________________________________________   Thank you for choosing me and [facility].  Vito Cirigliano, D.O.

## 2022-03-08 NOTE — Progress Notes (Signed)
Chief Complaint: Abdominal pain, GERD, hematemesis   Referring Provider:     Hoyt Koch, MD    HPI:     Kelly Reed is a 56 y.o. female with a history of HTN, hyperlipidemia,, anxiety, diabetes, referred to the Gastroenterology Clinic for evaluation of abdominal pain, GERD, hematemesis.  Reports acute onset RUQ pain on 02/10/2022 along with nausea/vomiting and hematemesis, then asthma attack on way to ER.  Was evaluated in the ER.  - Normal CBC, BMP, lipase, troponin. Normal ECG - AST/ALT 54/73 (stable from previous) with normal ALP, T. bili - CT A/P: Normal GI tract, liver, pancreas, spleen.  17 mm right adrenal nodule - CXR: Low lung volumes without acute abnormality - Treated with bronchodilator therapy for asthma, increased Prilosec to bid, added Pepcid, Carafate   Was seen in f/u by [facility] 02/24/2022, prescribed doxy and depo-medrol for asthma exacerbation and CXR findings.   She does report a longstanding history of GERD for 8-9 years. Index sxs of HB, belching, regurgitation. Occasional solid food dysphagia, pointing to suprasternal notch.  Was previously taking Prilosec OTC daily for years with intermittent breakthrough, and since ER evaluation increased to omeprazole 20 mg bid with improvement. Completed 2 weeks of Carafate.   RUQ pain has since improved with 3 episodes since ER, last being 2 weeks ago. No recurrence of hematemesis.    Endoscopic History: - Colonoscopy (01/09/2020): 5 mm ascending colon polyp (path: Tubular adenoma), Diminutive rectal polyp (path: Tubular adenoma).  Left-sided diverticulosis.  Repeat in 7 years   Past Medical History:  Diagnosis Date   Allergy    Anemia    Anxiety    Asthma    COVID-19 04/29/2019   COVID-19 virus detected 04/11/2019   Diabetes mellitus type II    GERD (gastroesophageal reflux disease)    Hyperlipidemia    Hypertension    NSVD (normal spontaneous vaginal delivery)    x 2     Past Surgical  History:  Procedure Laterality Date   GANGLION CYST EXCISION     left foot   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 ultrasound  12/04   negative   Surg. eval lap cholecstectomy  05/28/2006   deferred by patient   [place]   bilateral   Family History  Problem Relation Age of Onset   Breast cancer Mother 67       pt's mother passed away from breast cancer when pt was 56 yo   Diabetes Maternal Grandmother    Colon cancer Neg Hx    Esophageal cancer Neg Hx    Rectal cancer Neg Hx    Stomach cancer Neg Hx    Social History   Tobacco Use   Smoking status: Never   Smokeless tobacco: Never  Vaping Use   Vaping Use: Never used  Substance Use Topics   Alcohol use: Not Currently   Drug use: No   Current Outpatient Medications  Medication Sig Dispense Refill   albuterol (VENTOLIN HFA) 108 (90 Base) MCG/ACT inhaler INHALE 2 PUFFS BY MOUTH INTO THE LUNGS EVERY 6 HOURS AS NEEDED WHEEZING OR SHORTNESS OF BREATH 18 g 1   albuterol (VENTOLIN HFA) 108 (90 Base) MCG/ACT inhaler Inhale 2 puffs into the lungs every 4 (four) hours as needed for wheezing or shortness of breath. 8 g 0   aspirin EC 81 MG tablet Take 1  tablet (81 mg total) by mouth daily.     budesonide-formoterol (SYMBICORT) 160-4.5 MCG/ACT inhaler Inhale 2 puffs into the lungs 2 (two) times daily.     Dulaglutide (TRULICITY) 3 XJ/8.8TG SOPN Inject 3 mg as directed once a week. 2 mL 3   escitalopram (LEXAPRO) 20 MG tablet Take 1 tablet (20 mg total) by mouth daily. 90 tablet 3   fluticasone (FLONASE) 50 MCG/ACT nasal spray Place 1 spray into both nostrils 2 (two) times daily. 16 g 6   levocetirizine (XYZAL) 5 MG tablet Take 1 tablet by mouth every evening.   9   MAGNESIUM PO Take by mouth.     metFORMIN (GLUCOPHAGE) 1000 MG tablet Take 1 tablet (1,000 mg total) by mouth 2 (two) times daily with a meal. 180 tablet 3   montelukast (SINGULAIR) 10 MG tablet TAKE 1 TABLET(10 MG) BY MOUTH AT  BEDTIME 90 tablet 3   rosuvastatin (CRESTOR) 10 MG tablet Take 1 tablet (10 mg total) by mouth daily. 90 tablet 3   sodium chloride (OCEAN) 0.65 % SOLN nasal spray Place 2 sprays into both nostrils every 2 (two) hours while awake.  0   doxycycline (VIBRA-TABS) 100 MG tablet Take 1 tablet (100 mg total) by mouth 2 (two) times daily. (Patient not taking: Reported on 03/08/2022) 14 tablet 0   omeprazole (PRILOSEC) 20 MG capsule Take 1 capsule (20 mg total) by mouth 2 (two) times daily before a meal. 30 capsule 0   No current facility-administered medications for this visit.   Allergies  Allergen Reactions   Other     Other reaction(s): skin peeling   Adhesive [Tape] Rash and Other (See Comments)    Peeling of skin     Review of Systems: All systems reviewed and negative except where noted in HPI.     Physical Exam:    Wt Readings from Last 3 Encounters:  03/08/22 220 lb 9.6 oz (100.1 kg)  02/24/22 218 lb (98.9 kg)  12/27/21 218 lb 6.4 oz (99.1 kg)    BP (!) 164/88 (BP Location: Right Arm, Patient Position: Sitting, Cuff Size: Normal)   Pulse 82   Ht '5\' 11"'$  (1.803 m)   Wt 220 lb 9.6 oz (100.1 kg)   SpO2 95%   BMI 30.77 kg/m  Constitutional:  Pleasant, in no acute distress. Psychiatric: Normal mood and affect. Behavior is normal. Cardiovascular: Normal rate, regular rhythm. No edema Pulmonary/chest: Effort normal and breath sounds normal. No wheezing, rales or rhonchi. Abdominal: Mild TTP in right lower rib area and right costochondral margin.  Abdomen otherwise soft, nondistended.  Negative Murphy sign.  Bowel sounds active throughout. There are no masses palpable. No hepatomegaly. Neurological: Alert and oriented to person place and time. Skin: Skin is warm and dry. No rashes noted.   ASSESSMENT AND PLAN;   1) GERD 2) Heartburn 3) Dysphagia - EGD to evaluate for erosive esophagitis, LES laxity, hiatal hernia along with evaluation for esophageal stricture, ring, etc.  with biopsies and esophageal dilation as appropriate - Continue PPI as currently prescribed - Continue antireflux lifestyle/dietary modifications with avoidance of exacerbating type foods - Chew food thoroughly, cut into small pieces, and drink plenty fluids with meals  4) RUQ pain - Discussed possible etiologies for RUQ pain, to include PUD, gastritis, duodenal ulcer, biliary pathology, or possible superimposed costochondritis - HIDA scan - EGD as above  5) Hepatic steatosis 6) Elevated liver enzymes - Mildly elevated liver enzymes, stable over time.  RUQ [place] in 01/2022  with hepatic steatosis.  Otherwise no radiographic, serologic, or clinical e/o portal hypertension or impaired hepatic synthetic function - Counseled on management of underlying comorbidities (HTN, HLD, diabetes) along with appropriate diet/exercise - Repeat liver enzymes in 6 months - If continued elevation, can plan for extended serologic work-up to rule out concomitant liver disease after evaluation of more acute/pressing issues as above   7) History of colon polyps - Repeat colonoscopy 2028 for ongoing polyp surveillance  The indications, risks, and benefits of EGD were explained to the patient in detail. Risks include but are not limited to bleeding, perforation, adverse reaction to medications, and cardiopulmonary compromise. Sequelae include but are not limited to the possibility of surgery, hospitalization, and mortality. The patient verbalized understanding and wished to proceed. All questions answered, referred to scheduler. Further recommendations pending results of the exam.     Lavena Bullion, DO, FACG  03/08/2022, 11:07 AM   Hoyt Koch, *
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
## 2022-03-14 ENCOUNTER — Telehealth: Payer: Self-pay | Admitting: Gastroenterology

## 2022-03-14 NOTE — Telephone Encounter (Signed)
Pt states she cancelled her appt for imaging due to the large copay. Wanted to know if she should proceed with her procedure on Friday, please advise. Thank you

## 2022-03-15 ENCOUNTER — Ambulatory Visit (HOSPITAL_COMMUNITY): Payer: No Typology Code available for payment source

## 2022-03-15 NOTE — Telephone Encounter (Signed)
Left message for pt to call back  °

## 2022-03-15 NOTE — Telephone Encounter (Signed)
Pt states she had to cancel HIDA scan due to large copay. Pt did not have any questions about procedure and wanted to keep appointment on 9/8.

## 2022-03-17 ENCOUNTER — Encounter: Payer: Self-pay | Admitting: Gastroenterology

## 2022-03-17 ENCOUNTER — Ambulatory Visit (AMBULATORY_SURGERY_CENTER): Payer: No Typology Code available for payment source | Admitting: Gastroenterology

## 2022-03-17 VITALS — BP 146/78 | HR 76 | Temp 96.8°F | Resp 18 | Ht 71.0 in | Wt 220.0 lb

## 2022-03-17 DIAGNOSIS — R1319 Other dysphagia: Secondary | ICD-10-CM | POA: Diagnosis not present

## 2022-03-17 DIAGNOSIS — K219 Gastro-esophageal reflux disease without esophagitis: Secondary | ICD-10-CM | POA: Diagnosis not present

## 2022-03-17 DIAGNOSIS — K2100 Gastro-esophageal reflux disease with esophagitis, without bleeding: Secondary | ICD-10-CM | POA: Diagnosis not present

## 2022-03-17 DIAGNOSIS — K449 Diaphragmatic hernia without obstruction or gangrene: Secondary | ICD-10-CM | POA: Diagnosis not present

## 2022-03-17 DIAGNOSIS — R1011 Right upper quadrant pain: Secondary | ICD-10-CM

## 2022-03-17 MED ORDER — PANTOPRAZOLE SODIUM 20 MG PO TBEC: 20.0000 mg | DELAYED_RELEASE_TABLET | Freq: Two times a day (BID) | ORAL | 3 refills | Status: DC

## 2022-03-17 MED ORDER — SODIUM CHLORIDE 0.9 % IV SOLN: 500.0000 mL | INTRAVENOUS | Status: DC

## 2022-03-17 MED ORDER — PANTOPRAZOLE SODIUM 40 MG PO TBEC: 40.0000 mg | DELAYED_RELEASE_TABLET | Freq: Two times a day (BID) | ORAL | 1 refills | Status: DC

## 2022-03-17 NOTE — Progress Notes (Signed)
Please see note dated 03/08/2022 for full H&P. No changes in clinical history in the interim.

## 2022-03-17 NOTE — Progress Notes (Signed)
Pt's states no medical or surgical changes since previsit or office visit. 

## 2022-03-17 NOTE — Patient Instructions (Signed)
Rx's were sent to your pharmacy.  Await pathology results.  Post dilation diet protocol handout provided.    YOU HAD AN ENDOSCOPIC PROCEDURE TODAY AT [facility]:   Refer to the procedure report that was given to you for any specific questions about what was found during the examination.  If the procedure report does not answer your questions, please call your gastroenterologist to clarify.  If you requested that your care partner not be given the details of your procedure findings, then the procedure report has been included in a sealed envelope for you to review at your convenience later.  YOU SHOULD EXPECT: Some feelings of bloating in the abdomen. Passage of more gas than usual.  Walking can help get rid of the air that was put into your GI tract during the procedure and reduce the bloating. If you had a lower endoscopy (such as a colonoscopy or flexible sigmoidoscopy) you may notice spotting of blood in your stool or on the toilet paper. If you underwent a bowel prep for your procedure, you may not have a normal bowel movement for a few days.  Please Note:  You might notice some irritation and congestion in your nose or some drainage.  This is from the oxygen used during your procedure.  There is no need for concern and it should clear up in a day or so.  SYMPTOMS TO REPORT IMMEDIATELY:  Following upper endoscopy (EGD)  Vomiting of blood or coffee ground material  New chest pain or pain under the shoulder blades  Painful or persistently difficult swallowing  New shortness of breath  Fever of 100F or higher  Black, tarry-looking stools  For urgent or emergent issues, a gastroenterologist can be reached at any hour by calling [phone]. Do not use MyChart messaging for urgent concerns.    DIET:  Clear liquids until 3:45pm, then a soft diet the rest of the day.  May resume previous diet tomorrow.    ACTIVITY:  You should plan to take it easy for the rest of today  and you should NOT DRIVE or use heavy machinery until tomorrow (because of the sedation medicines used during the test).    FOLLOW UP: Our staff will call the number listed on your records the next business day following your procedure.  We will call around 7:15- 8:00 am to check on you and address any questions or concerns that you may have regarding the information given to you following your procedure. If we do not reach you, we will leave a message.  If you develop any symptoms (ie: fever, flu-like symptoms, shortness of breath, cough etc.) before then, please call [phone].  If you test positive for Covid 19 in the 2 weeks post procedure, please call and report this information to [place].    If any biopsies were taken you will be contacted by phone or by letter within the next 1-3 weeks.  Please call us at [phone] if you have not heard about the biopsies in 3 weeks.    SIGNATURES/CONFIDENTIALITY: You and/or your care partner have signed paperwork which will be entered into your electronic medical record.  These signatures attest to the fact that that the information above on your After Visit Summary has been reviewed and is understood.  Full responsibility of the confidentiality of this discharge information lies with you and/or your care-partner.

## 2022-03-17 NOTE — Progress Notes (Signed)
Called to room to assist during endoscopic procedure.  Patient ID and intended procedure confirmed with present staff. Received instructions for my participation in the procedure from the performing physician.  

## 2022-03-17 NOTE — Op Note (Signed)
[facility] Patient Name: Kelly Reed Procedure Date: 03/17/2022 2:08 PM MRN: [id number] Endoscopist: Gerrit Heck , MD Age: 56 Referring MD:  Date of Birth: 03/30/1966 Gender: Female Account #: [id number] Procedure:                Upper GI endoscopy Indications:              Abdominal pain in the right upper quadrant,                            Dysphagia, Heartburn, Suspected esophageal reflux,                            Hematemesis, Nausea with vomiting Medicines:                Monitored Anesthesia Care Procedure:                Pre-Anesthesia Assessment:                           - Prior to the procedure, a History and Physical                            was performed, and patient medications and                            allergies were reviewed. The patient's tolerance of                            previous anesthesia was also reviewed. The risks                            and benefits of the procedure and the sedation                            options and risks were discussed with the patient.                            All questions were answered, and informed consent                            was obtained. Prior Anticoagulants: The patient has                            taken no previous anticoagulant or antiplatelet                            agents. ASA Grade Assessment: II - A patient with                            mild systemic disease. After reviewing the risks                            and benefits, the patient was deemed in  satisfactory condition to undergo the procedure.                           After obtaining informed consent, the endoscope was                            passed under direct vision. Throughout the                            procedure, the patient's blood pressure, pulse, and                            oxygen saturations were monitored continuously. The                            GIF [id number] #[id number] was  introduced through the                            mouth, and advanced to the second part of duodenum.                            The upper GI endoscopy was accomplished without                            difficulty. The patient tolerated the procedure                            well. Scope In: Scope Out: Findings:                 LA Grade A (one or more mucosal breaks less than 5                            mm, not extending between tops of 2 mucosal folds)                            esophagitis with no bleeding was found 34 cm from                            the incisors.                           A 3 cm hiatal hernia was present.                           The gastroesophageal flap valve was visualized                            endoscopically and classified as Hill Grade III                            (minimal fold, loose to endoscope, hiatal hernia                            likely).  The upper third of the esophagus and middle third                            of the esophagus were normal. Given symptoms of                            dysphagia, the decision was made to perform                            esophageal dilation. A guidewire was placed and the                            scope was withdrawn. Dilation was performed with a                            Savary dilator with mild resistance at 18 mm. The                            dilation site was examined following endoscope                            reinsertion and showed mild mucosal disruption at                            16 cm from the incisors, consistent with successful                            esophageal dilation. Estimated blood loss was                            minimal.                           The entire examined stomach was normal. Biopsies                            were taken with a cold forceps for Helicobacter                            pylori testing. Estimated blood loss was  minimal.                           The examined duodenum was normal. Complications:            No immediate complications. Estimated Blood Loss:     Estimated blood loss was minimal. Impression:               - LA Grade A reflux esophagitis with no bleeding.                           - 3 cm hiatal hernia.                           - Gastroesophageal flap valve classified as Hill  Grade III (minimal fold, loose to endoscope, hiatal                            hernia likely).                           - Normal upper third of esophagus and middle third                            of esophagus. Dilated with 18 mm Savary dilator                            with appropriate mucosal rent in the proximal                            esophagus.                           - Normal stomach. Biopsied.                           - Normal examined duodenum. Recommendation:           - Patient has a contact number available for                            emergencies. The signs and symptoms of potential                            delayed complications were discussed with the                            patient. Return to normal activities tomorrow.                            Written discharge instructions were provided to the                            patient.                           - Soft diet today then advance to previous diet                            tomorrow per post dilation protocol.                           - Continue present medications.                           - Await pathology results.                           - Increase Prilosec (omeprazole) to 40 mg PO BID                            for  6 weeks, then reduce to 20 mg twice daily and                            titrate to lowest effective dose for control of                            reflux.                           - Return to GI clinic PRN. Gerrit Heck, MD 03/17/2022 2:49:58 PM

## 2022-03-17 NOTE — Progress Notes (Signed)
Sedate, gd SR, tolerated procedure well, VSS, report to RN 

## 2022-03-20 ENCOUNTER — Telehealth: Payer: Self-pay | Admitting: *Deleted

## 2022-03-20 NOTE — Telephone Encounter (Signed)
  Follow up Call-     03/17/2022    2:12 PM 01/09/2020    7:26 AM  Call back number  Post procedure Call Back phone  # [phone]  Permission to leave phone message Yes Yes     Patient questions:  Do you have a fever, pain , or abdominal swelling? No. Pain Score  0 *  Have you tolerated food without any problems? Yes.    Have you been able to return to your normal activities? Yes.    Do you have any questions about your discharge instructions: Diet   No. Medications  No. Follow up visit  No.  Do you have questions or concerns about your Care? No.  Actions: * If pain score is 4 or above: No action needed, pain <4.

## 2022-03-21 ENCOUNTER — Telehealth: Payer: Self-pay

## 2022-03-21 NOTE — Telephone Encounter (Signed)
Will you obtain Prior Auth for Pantoprazole 20 mg twice daily for this patient please?

## 2022-03-22 ENCOUNTER — Other Ambulatory Visit (HOSPITAL_COMMUNITY): Payer: Self-pay

## 2022-03-22 ENCOUNTER — Telehealth: Payer: Self-pay | Admitting: Pharmacy Technician

## 2022-03-22 ENCOUNTER — Other Ambulatory Visit: Payer: Self-pay | Admitting: Internal Medicine

## 2022-03-22 DIAGNOSIS — E1165 Type 2 diabetes mellitus with hyperglycemia: Secondary | ICD-10-CM

## 2022-03-22 NOTE — Telephone Encounter (Signed)
Patient Advocate Encounter  Received notification that prior authorization for PANTOPRAZOLE '40MG'$  is required.   PA submitted on 9.13.23 VIA [employer] EOC [id number] Status is pending    Luciano Cutter, CPhT Patient Advocate Phone: [phone]

## 2022-03-22 NOTE — Telephone Encounter (Signed)
PA has been submitted and telephone encounter has been created 

## 2022-03-23 NOTE — Telephone Encounter (Signed)
PA requested for Pantoprazole 20 mg BID. Received fax from  [employer] approval for Pantoprazole 40 mg from 03/22/22 to 03/22/23. Would you please check into that please? Thank you.

## 2022-03-24 ENCOUNTER — Other Ambulatory Visit (HOSPITAL_COMMUNITY): Payer: Self-pay

## 2022-03-24 NOTE — Telephone Encounter (Signed)
PA has been approved. Patient may fill at pharmacy level.

## 2022-03-24 NOTE — Telephone Encounter (Signed)
Patient Advocate Encounter  Prior Authorization for PANTOPRAZOLE has been approved.    PA# .Marland KitchenMarland Kitchen Effective dates: 03/20/22 through 03/22/2023  Kareema Keitt B. CPhT P: [phone] F: [phone]

## 2022-03-29 ENCOUNTER — Telehealth: Payer: Self-pay | Admitting: Registered Nurse

## 2022-03-29 ENCOUNTER — Encounter: Payer: Self-pay | Admitting: Registered Nurse

## 2022-03-29 DIAGNOSIS — T148XXA Other injury of unspecified body region, initial encounter: Secondary | ICD-10-CM

## 2022-03-29 DIAGNOSIS — M79646 Pain in unspecified finger(s): Secondary | ICD-10-CM

## 2022-03-29 NOTE — Telephone Encounter (Signed)
Patient contacted NP requested appt for splinter removal left message.  I left patient message [facility] is closed Wednesdays/Saturdays/Sundays.  Patient notified if work injury to notify her supervisor and I will contact [facility] to get appt scheduled. Patient replied left message not a work injury tried to remove at home but unsucessful.  Left patient message I recommended appt with PCM/UC provider today or she may schedule to see me or RN Vinnie Level in clinic tomorrow.  Exitcare handout on splinters sent to patient my chart.  Left patient message sometimes soaking in salt water then using finger nail clipper to remove skin directly above splinter is successful strategy for home removal and pushing/squeezing soft tissue below deepest portion of splinter towards entry site to push it up so piece can be secured by tweezer/fingernails/clippers to remove it.  Notified patient [facility] sites capable of removing splinters as is her PCM office.  Message left that [facility] clinics typically do not handle this type of procedure as they are throat swab/ear infection/URI typically.

## 2022-03-29 NOTE — Telephone Encounter (Signed)
Reviewed Epic patient was seen by GI and had EGD 03/17/22  Biopsy negative for h. Pylori  Noted telephone encounter patient Rx PPI changed to pantaprazole 03/22/22 by GI epic updated medication list.  - LA Grade A reflux esophagitis with no bleeding. - 3 cm hiatal hernia. - Gastroesophageal flap valve classified as Hill Grade III (minimal fold, loose to endoscope, hiatal hernia likely). - Normal upper third of esophagus and middle third of esophagus. Dilated with 18 mm Savary dilator with appropriate mucosal rent in the proximal esophagus. - Normal stomach. Biopsied. - Normal examined duodenum. Impression: - Patient has a contact number available for emergencies. The signs and symptoms of potential delayed complications were discussed with the patient. Return to normal activities tomorrow. Written discharge instructions were provided to the patient. - Soft diet today then advance to previous diet tomorrow per post dilation protocol. - Continue present medications. - Await pathology results. - Increase Prilosec (omeprazole) to 40 mg PO BID for 6 weeks, then reduce to 20 mg twice daily and titrate to lowest effective dose for control of reflux. - Return to GI clinic PRN.

## 2022-04-07 NOTE — Telephone Encounter (Signed)
Patient seen in workcenter.  Stated had to go to 2 urgent care providers as first did not have any lidocaine and extremely painful when he tried to remove splinter from underneath her fingernail and she had to tell him to stop she was leaving.  Went to second urgent care near her home and provider was able to remove it.  Denied signs/symptoms of infection/pain and feeling well denied concerns.  Fingernail clean dry and intact no erythema noted beneath nail and patient processing [place] without difficulty  Encounter closed.

## 2022-04-11 ENCOUNTER — Telehealth: Payer: Self-pay | Admitting: Internal Medicine

## 2022-04-11 NOTE — Telephone Encounter (Signed)
Patient is trying to pick up her trulicity [facility] was [facility] [place] - They are tellling her that it will have to have a prior authorization for her to pick up.  Insurance will not pay for it unless it is authorized by the doctor.

## 2022-04-13 ENCOUNTER — Other Ambulatory Visit: Payer: Self-pay | Admitting: *Deleted

## 2022-04-13 NOTE — Telephone Encounter (Signed)
Notified pt insurance and [employer] Rx for special pharmacy-pt insurance is not active. Pt will call back the office back regarding insurance

## 2022-04-13 NOTE — Telephone Encounter (Signed)
Patient is calling in asking if the medication can be sent to [facility] as she is having a hard time getting it filled.

## 2022-04-13 NOTE — Telephone Encounter (Signed)
Notified pt start PA today--waiting for approval

## 2022-04-17 NOTE — Telephone Encounter (Signed)
Patient called back with info - RX benefits - [phone]                                                                        [facility] #  [id number]                                                                       RX Group # [id number]  Call them to get her prescriptions authorized.

## 2022-04-18 NOTE — Telephone Encounter (Signed)
Bin #[id number] Group # [id number] Rxpcn# [id number] [phone]

## 2022-04-18 NOTE — Telephone Encounter (Addendum)
Patient is calling in asking for an update on the PA. Also said insurance is requiring a PA for ALL medications from here on out.

## 2022-04-19 NOTE — Telephone Encounter (Signed)
LVM--regarding insurance member ID number to go forward with the PA.

## 2022-04-19 NOTE — Telephone Encounter (Signed)
CORRECT MEMBER AR:[id number]

## 2022-04-19 NOTE — Telephone Encounter (Signed)
Start PA--[employer]efit. [place] [id number].

## 2022-04-19 NOTE — Telephone Encounter (Signed)
Member ID:  [id number]  Group #  [id number]

## 2022-04-21 ENCOUNTER — Telehealth: Payer: Self-pay | Admitting: *Deleted

## 2022-04-21 NOTE — Telephone Encounter (Signed)
Rx[employer]efits--approved TRULICITY '3MG'$ /0.5 ML PEN  04/20/22 TO 04/20/2023.

## 2022-04-25 ENCOUNTER — Telehealth: Payer: Self-pay | Admitting: Registered Nurse

## 2022-04-25 ENCOUNTER — Encounter: Payer: Self-pay | Admitting: Registered Nurse

## 2022-04-25 DIAGNOSIS — Z5989 Other problems related to housing and economic circumstances: Secondary | ICD-10-CM

## 2022-05-02 ENCOUNTER — Other Ambulatory Visit: Payer: Self-pay | Admitting: Internal Medicine

## 2022-05-02 NOTE — Telephone Encounter (Signed)
Last refill-04/19/21 Last OV-02/24/22 Please advise

## 2022-05-08 NOTE — Telephone Encounter (Signed)
Late entry.  Patient reported problems filling her chronic medications at pharmacy.  Attempted to give allergy provider/PCM her new insurance card information and staff told her not needed until 10 Jul 2022.  Patient stated she insisted staff take copy of card but they would not at visits this week.  Discussed with patient tell staff her employer insurance medical/pharmacy changed 1 Oct.  Ensure her pharmacy notified of new numbers/pharmacy benefit now [facility] also and new number.  Patient stated she would do so at next visit next week immunotherapy weekly.  Patient verbalized understanding information/instructions, agreed with plan of care and had no further questions at this time.

## 2022-05-08 NOTE — Telephone Encounter (Signed)
Late entry 05/02/22 patient reported spoke with pharmacist and allergy staff.  New insurance information taken and was able to pick up medications no further questions or concerns at this time.

## 2022-05-11 ENCOUNTER — Telehealth: Payer: Self-pay | Admitting: Gastroenterology

## 2022-05-11 NOTE — Telephone Encounter (Signed)
Pt reports that her heartburn has recently gotten worse. Pt currently taking prilosec 20 mg twice a day 30 to 60 min prior to eating. Let pt know she can try upping the prilosec to 40 mg twice a day for a couple weeks to see if that helps. Let pt know if that doesn't help she could try adding pepcid at night. Asked pt to call back in a couple weeks to update [place] on symptoms.

## 2022-05-11 NOTE — Telephone Encounter (Signed)
Inbound call from patient stating she is experiencing vomiting and burning sensation in her chest. Patient is asking if something can be sent into the pharmacy. Please advise.  Thank you

## 2022-05-12 ENCOUNTER — Encounter: Payer: Self-pay | Admitting: Registered Nurse

## 2022-05-12 ENCOUNTER — Telehealth: Payer: Self-pay | Admitting: Registered Nurse

## 2022-05-12 DIAGNOSIS — J4541 Moderate persistent asthma with (acute) exacerbation: Secondary | ICD-10-CM

## 2022-05-12 NOTE — Telephone Encounter (Signed)
Patient contacted by telephone having some cough/hoarse voice/cough.  Noted in EPIC patient spoke with GI yesterday also as worsening heartburn and prilosec increased to '40mg'$  BID for a couple of weeks also as vomiting and chest burning.  Patient saw allergist Dr Sharma10/31/23 diagnosed acute bronchitis and prednisone '10mg'$  taper x 6 days initiated and patient counseled on inhaler technique, nasal sprays, immunotherapy orders renewed.  Symbicort 160-4.'5mg'$  2 puffs po BID x 30 days, singulair '10mg'$  po qhs x 30 days, flonase 1-2 sprays nasal daily x 30 days spirometry ordered and next follow up 6-12 months.  Patient thinks she is improving with medication from Dr Donneta Romberg but would like sp02/lung check prior to the weekend.  Discussed with patient I am not in clinic today but RN Vinnie Level there until 1200 today and she should do walk in appt with her and have RN Vinnie Level call me with findings.  No cough/throat clearing audible during 2 minute call spoke full sentences without difficulty.  Nasal congestion audible.  Patient verbalized understanding information/instructions, agreed with plan of care and had no further questions at this time.

## 2022-05-29 ENCOUNTER — Ambulatory Visit: Payer: No Typology Code available for payment source | Admitting: Occupational Medicine

## 2022-05-29 DIAGNOSIS — L500 Allergic urticaria: Secondary | ICD-10-CM

## 2022-05-29 NOTE — Progress Notes (Signed)
Patient came in due to rashes to hand and neck from her allergies. Pt took zyrtec this am. Patient given calagel and hydrocortisone cream to alternative for itching. Will let me know if worsens and needs to go home.

## 2022-06-03 NOTE — Telephone Encounter (Signed)
Patient seen in workcenter voice normal respirations even and unlabored no audible cough/shortness of breath/wheezing.  Denied questions or concerns.

## 2022-06-14 ENCOUNTER — Encounter: Payer: Self-pay | Admitting: Registered Nurse

## 2022-06-14 ENCOUNTER — Telehealth: Payer: Self-pay | Admitting: Registered Nurse

## 2022-06-14 ENCOUNTER — Ambulatory Visit: Payer: No Typology Code available for payment source | Admitting: Occupational Medicine

## 2022-06-14 VITALS — BP 143/90 | HR 94 | Temp 98.3°F

## 2022-06-14 DIAGNOSIS — E118 Type 2 diabetes mellitus with unspecified complications: Secondary | ICD-10-CM

## 2022-06-14 LAB — GLUCOSE, POCT (MANUAL RESULT ENTRY): POC Glucose: 168 mg/dl — AB (ref 70–99)

## 2022-06-14 NOTE — Progress Notes (Signed)
F/u from recent asthma flare up. VSS lung sounds clear. Occasional tightness.  COVID negative. CBG 168 after eating. Spoke to Osino Np recommend prednisone taper. Encouraged to drink more water.

## 2022-06-14 NOTE — Telephone Encounter (Signed)
Patient contacted NP "My asthma has been flaring the past two days out of work. Onsite today would like appt."  Hoarse voice spoke full sentences without difficulty; no cough/throat clearing audible during 2 minute call.  Patient has not home covid tested discussed can see RN Kimrey in clinic for free home test to rule out covid today unfortunately clinic does not have flu or RSV testing.  RN Evlyn Kanner will listen to her lungs/get vitals signs and assist her if she needs help with home test.  Others working in patient dept with recent covid infection diagnosed but did not list patient as close contact.  Patient stated she would see RN Kimrey this morning.

## 2022-06-15 MED ORDER — PREDNISONE 10 MG PO TABS: ORAL_TABLET | ORAL | 0 refills | Status: AC

## 2022-06-15 NOTE — Telephone Encounter (Signed)
See nursing note RN Evlyn Kanner 06/14/22  RN Kimrey contacted NP via telephone covid home test negative.

## 2022-06-21 ENCOUNTER — Ambulatory Visit: Payer: No Typology Code available for payment source | Admitting: Occupational Medicine

## 2022-06-21 VITALS — BP 121/91 | HR 88 | Temp 98.9°F

## 2022-06-21 DIAGNOSIS — J069 Acute upper respiratory infection, unspecified: Secondary | ICD-10-CM

## 2022-06-21 NOTE — Progress Notes (Signed)
Patient had allergy shots yesterday. People coughing in clinic. She states doesn't feel well today. Muscles aches sinus pressure and HA. Given Cold/Flu meds. Not running a fever. Covid test negative. Educated to come back if worsening. Also can get free COVID test at specific pharmacy. Can test every other day x 2. Monitor for fever. Pt states has saline spray at home.

## 2022-06-23 ENCOUNTER — Ambulatory Visit (INDEPENDENT_AMBULATORY_CARE_PROVIDER_SITE_OTHER): Payer: No Typology Code available for payment source

## 2022-06-23 ENCOUNTER — Ambulatory Visit: Payer: No Typology Code available for payment source | Admitting: Internal Medicine

## 2022-06-23 DIAGNOSIS — Z23 Encounter for immunization: Secondary | ICD-10-CM

## 2022-06-23 NOTE — Progress Notes (Signed)
Pt received 2nd shingles shot without any complications.

## 2022-07-05 ENCOUNTER — Ambulatory Visit: Payer: No Typology Code available for payment source | Admitting: Occupational Medicine

## 2022-07-05 ENCOUNTER — Encounter: Payer: Self-pay | Admitting: Registered Nurse

## 2022-07-05 ENCOUNTER — Telehealth: Payer: Self-pay | Admitting: Registered Nurse

## 2022-07-05 DIAGNOSIS — J111 Influenza due to unidentified influenza virus with other respiratory manifestations: Secondary | ICD-10-CM

## 2022-07-05 MED ORDER — OSELTAMIVIR PHOSPHATE 75 MG PO CAPS: 75.0000 mg | ORAL_CAPSULE | Freq: Two times a day (BID) | ORAL | 0 refills | Status: AC

## 2022-07-05 NOTE — Telephone Encounter (Signed)
Notified by RN Evlyn Kanner patient reported 102 Fever covid home test negative.  Tamiflu generic sent to patient pharmacy of choice electronically '75mg'$  po BID x 5 days #10 RF0.  Retest for covid in 48 hours.  Will follow up with patient via telephone tomorrow.

## 2022-07-05 NOTE — Progress Notes (Signed)
Phone call reports she called in sick today. Covid test negative. Complaints of sore throat, sinus congestion/ pressure, headache, fever 102,  and body aches. Educated probably has the flu. Otila Kluver NP can call in antiviral for Tamiflu. Can't come back to work til fever free for at least 24 hrs. [facility] [place]. Hr, Supervisor, and NP made aware.

## 2022-07-07 NOTE — Telephone Encounter (Signed)
Telephone message message left for patient 07/06/22 no answer symptom follow up and to check if she was able to start tamiflu.  Spoke with patient via telephone 07/07/22.  She stated has some ear pressure "I know I have fluid in my ears"  Temperature 101 today just took tylenol.  Has had 3 doses of tamiflu so far.  Has not covid retested discussed if still having fever tomorrow am to re home test for covid as possible to have both flu and covid infections simultaneously  Patient denied chest congestion.  No audible cough during 4 minute call, spoke full sentences without difficulty.  Nasal congestion audible.  Patient reported she is using nasal saline and salt water gargles also and they are helping.  Denied further questions or concerns at this time.  Discussed with patient I will follow up with her via telephone again tomorrow and she can call me at [phone] if questions or concerns while clinic closed through 10 Jul 2022.  Patient verbalized understanding information/instructions, agreed with plan of care and had no further questions at this time.

## 2022-07-07 NOTE — Telephone Encounter (Signed)
See tcon dated 07/05/22

## 2022-07-11 ENCOUNTER — Telehealth: Payer: Self-pay | Admitting: Registered Nurse

## 2022-07-11 ENCOUNTER — Encounter: Payer: Self-pay | Admitting: Registered Nurse

## 2022-07-11 DIAGNOSIS — J019 Acute sinusitis, unspecified: Secondary | ICD-10-CM

## 2022-07-11 DIAGNOSIS — U071 COVID-19: Secondary | ICD-10-CM

## 2022-07-11 MED ORDER — DOXYCYCLINE HYCLATE 100 MG PO TABS: 100.0000 mg | ORAL_TABLET | Freq: Two times a day (BID) | ORAL | 0 refills | Status: AC

## 2022-07-11 MED ORDER — MOLNUPIRAVIR EUA 200MG CAPSULE: 4.0000 | ORAL_CAPSULE | Freq: Two times a day (BID) | ORAL | 0 refills | Status: AC

## 2022-07-11 NOTE — Telephone Encounter (Signed)
Notified by patient stayed home from work today headache, I have a fever of 101.2. Chills and body aches. I was ok Sunday. I woke up about 4am this morning. I do not have any covid test left. I do not want to get in truoble for not coming in but i just cant like this.  What is next all my Tamiflu is gone. Just took Tylenol and some cold tablets. Discussed with patient could be sinus infection and has taken doxycycline or augmentin in the past with good results.  She is using nasal saline.  Has sinus pain/pressure today along with post nasal drip.  One side clogged up more than the other.  Spouse going to pick up covid tests today and she will test when he brings them home after work today.  Also symptoms had resolved after finishing tamiflu and felt really good yesterday did house work all good yesterday asymptomatic.  Discussed with patient electronic Rx for doxycycline '100mg'$  po BID x 10 days #20 RF0 and molnupiravir '200mg'$  sig t4 po BID x 5 days #40 RF0 sent to her pharmacy of choice in case covid test positive can pick up right away as clinic will be closed by the time she covid tests.  A&Ox3 nasal congestion audible during 8 minute call along with throat clearing.  Spoke full sentences without difficulty.  Patient has albuterol inhaler at home for prn use and prednisone if asthma flare.  Discussed with patient if not in chest I don't recommend starting prednisone at this time. Avoid dehydration/hydrate with water today due to fever. She denied wheezing/chest tightness/protracted coughing.  Symptoms in head primarily.  Treat for sinus infection until covid test results known.  Infectious disease protocol for employer due to fever today.  RN Kimrey notified HR team and supervisor excused absence for today and re-evaluation tomorrow as must be fever free x 24 hours off meds to return to work per employer policy.  Patient verbalized understanding information/instructions, agreed with plan of care and had no further  questions at this time.

## 2022-07-11 NOTE — Telephone Encounter (Signed)
Patient contacted NP home covid test positive this evening.  Took first dose molnupiravir already.  Has taken before.   Denied close contacts in previous 48hours at work e.g. no mask greater than 15 minutes within 6 feet face to face contact.  Pt began quarantine this am at home 07/11/22. Patient did not develop symptoms of  trouble breathing, chest pain, nausea, vomiting, diarrhea, sore throat, body aches,or chills.   5 day quarantine per [facility] recommendations. Day 1 of quarantine was 07/11/22. Patient to contact clinic staff if vomiting after coughing or unable to tolerate po fluids.  Discussed flu and other viral illnesses circulating in community and some causing GI upset.  If GI upset I have recommended clear fluids then bland diet.  Avoid dairy/spicy, fried and large portions of meat while having nausea.  If vomiting hold po intake x 1 hour.  Then sips clear fluids like broths, ginger ale, power ade, gatorade, pedialyte may advance to soft/bland if no vomiting x 24 hours and appetite returned otherwise hydration main focus. Call me at work from home number if symptoms not improved with plan of care  patient to call if high fever, dehydration, marked weakness, fainting, increased abdominal pain, blood in stool or vomit (red or black).     Reviewed possible Covid symptoms including cough, shortness of breath with exertion or at rest, runny nose, congestion, sinus pain/pressure, sore throat, fever/chills, body aches, fatigue, loss of taste/smell, GI symptoms of nausea/vomiting/diarrhea. Also reviewed same day/emergent eval/ER precautions of dizziness/syncope, confusion, blue tint to lips/face, severe shortness of breath/difficulty breathing/wheezing.  Patient to isolate in own room and if possible use only one bathroom if living with others in home.  Wear mask when out of room to help prevent spread to others in household.  Sanitize high touch surfaces with lysol/chlorox/bleach spray or wipes daily as viruses  are known to live on surfaces from 24 hours to days.  Patient does want antivirals.  Patient at higher risk for hospitalization due to autoimmune disease, asthma, obesity, hypertension, diabetes and age.  Patient is not up to date on covid vaccines.  Recommend fall 2023 booster 60 days after resolution of symptoms.  Patient is  on any prescription medications or daily medications. If taking medications Epocrates interaction checker used to verify if any drug interactions. Patient molnupiravir emergency use handout sent to patient electronically along with covid quarantine exitcare handout to my chart.  Discussed how to take molnupiravir '400mg'$  take 4 tabs by mouth every 12 hours x 5 days. Discussed lemonade can sometimes help with metallic/plastic taste in mouth (side effect medication).  Discussed most common side effects GI upset and bad taste in mouth.  Use birth control/avoid getting pregnant while on paxlovid and no breastfeeding.  Discussed I recommended not having sex with anyone while sick/testing positive/10 day quarantine as could spread virus to partner.  Discussed with patient RN Evlyn Kanner would call again this week to follow up symptoms/see if questions/concerns.  Exitcare handouts on covid quarantine/home care sent to patient my chart.  FDA handout on molnupiravir sent to patient.  Patient has dayquil/nyquil at home for cough/cold use and is working for her.  May use salt water gargles and nasal saline 2 sprays each nostril q2h prn congestion/sore throat.  Research has shown it helps to prevent hospitalizations and decrease discomfort.   May use flonase nasal 48mg 1 spray each nostril BID prn rhinitis.  Dayquil and nyquil per manufacturer instructions.  Patient has used tessalon pearles in the past  and doesn't need at this time/not helpful.  Patient has proair inhaler at home for prn use protracted cough/wheezing/chest tightness.  Hasn't used yet but reports occasional wheezing with cough.  Discussed  albuterol use with patient 1-2 puffs po q4-6 hours prn.   Discussed honey 1 tablespoon every 4 hours is a natural cough suppressant but caution due to her diabetes.  Avoid dehydration and drink water to keep urine pale yellow clear and voiding every 2-4 hours while awake.  Patient alert and oriented x3, spoke full sentences without difficulty.  Some nasal congestion/throat clearing/hoarse voice audible during telephone call.  No cough, wheezing/shortness of breath during minute telephone call.  Discussed with patient can contact NP Otila Kluver through my chart/[phone] when clinic closed if questions or concerns until RN Evlyn Kanner returns to clinic 8-5 M-Th [phone].   Pt verbalized understanding and agreement with plan of care. No further questions/concerns at this time. Pt reminded to contact clinic with any changes in symptoms or questions/concerns. HR notified patient to work remote/quarantine through Day 5 7 Jan RTW estimated Day 6 8 Jan with strict mask wear through Day 10 21 Jul 2022 and no eating in employee lunch room.  Estimated return to work onsite 17 Jul 2022  Supervisor notified of excused absence.

## 2022-07-12 ENCOUNTER — Ambulatory Visit: Payer: No Typology Code available for payment source | Admitting: Occupational Medicine

## 2022-07-12 NOTE — Telephone Encounter (Signed)
See tcon dated 07/11/2022  Patient did finish tamiflu

## 2022-07-13 ENCOUNTER — Ambulatory Visit: Payer: No Typology Code available for payment source | Admitting: Occupational Medicine

## 2022-07-13 DIAGNOSIS — U071 COVID-19: Secondary | ICD-10-CM

## 2022-07-13 NOTE — Progress Notes (Signed)
Phone follow up call. Patient reports feeling some better today compared to yesterday. Still having a lot of sinus pressure and pain. Low grade fever 100. Reports worsen in morning and evening. Asking about what best to take for sinus issues, spoke to [facility] NP for recommendations. Educated to contact the clinic for any issues.

## 2022-07-14 ENCOUNTER — Other Ambulatory Visit: Payer: Self-pay

## 2022-07-14 ENCOUNTER — Other Ambulatory Visit: Payer: Self-pay | Admitting: Internal Medicine

## 2022-07-14 DIAGNOSIS — J302 Other seasonal allergic rhinitis: Secondary | ICD-10-CM

## 2022-07-14 DIAGNOSIS — E1165 Type 2 diabetes mellitus with hyperglycemia: Secondary | ICD-10-CM

## 2022-07-14 IMAGING — DX DG CHEST 2V
2 series · 2 of 2 positions shown · non-contrast
Comparison: Prior chest radiographs 10/04/2016 and earlier.

CLINICAL DATA: Cough. Productive cough, congestion, body aches.
History of allergies and asthma.

EXAM:
CHEST - 2 VIEW

[chest pa]
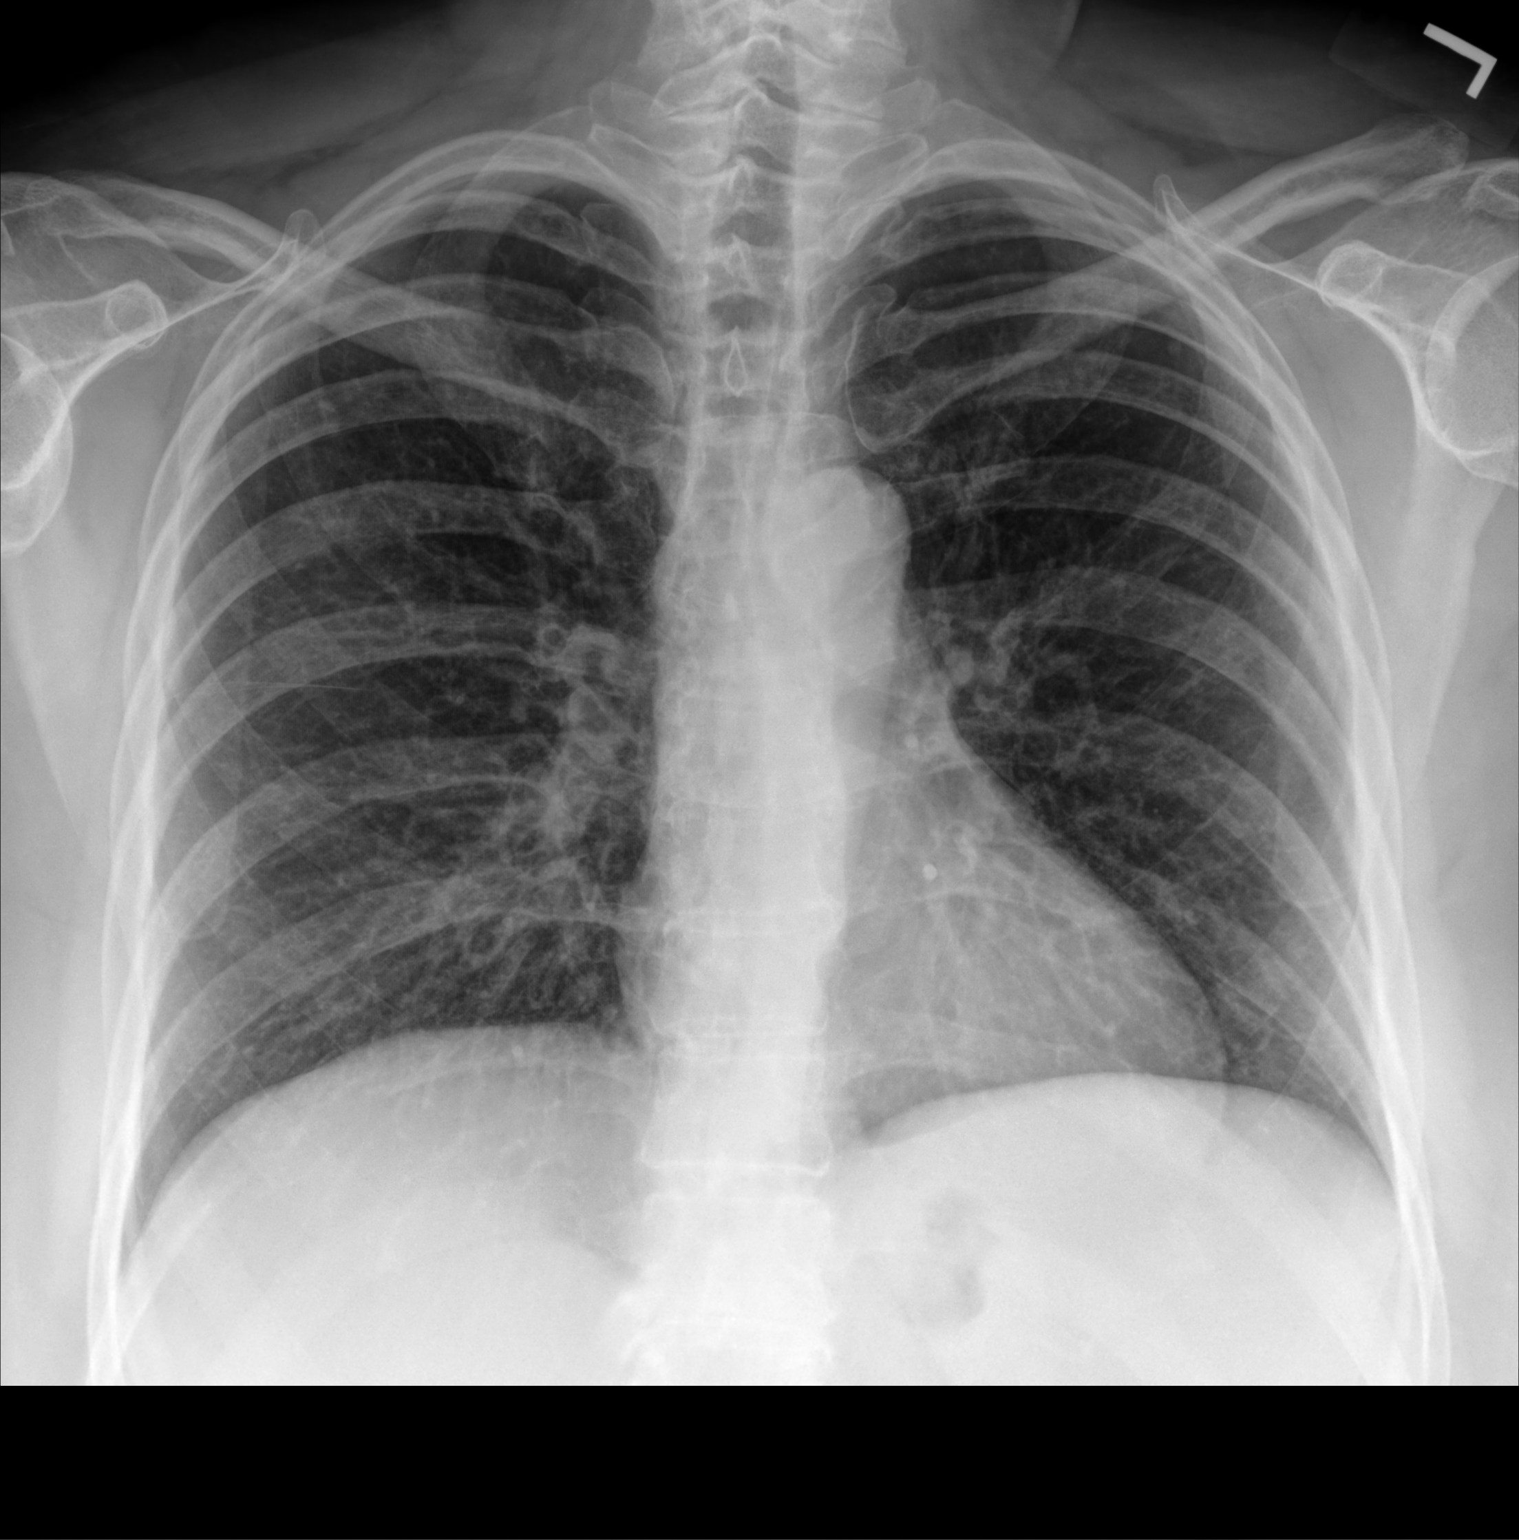

[chest lat]
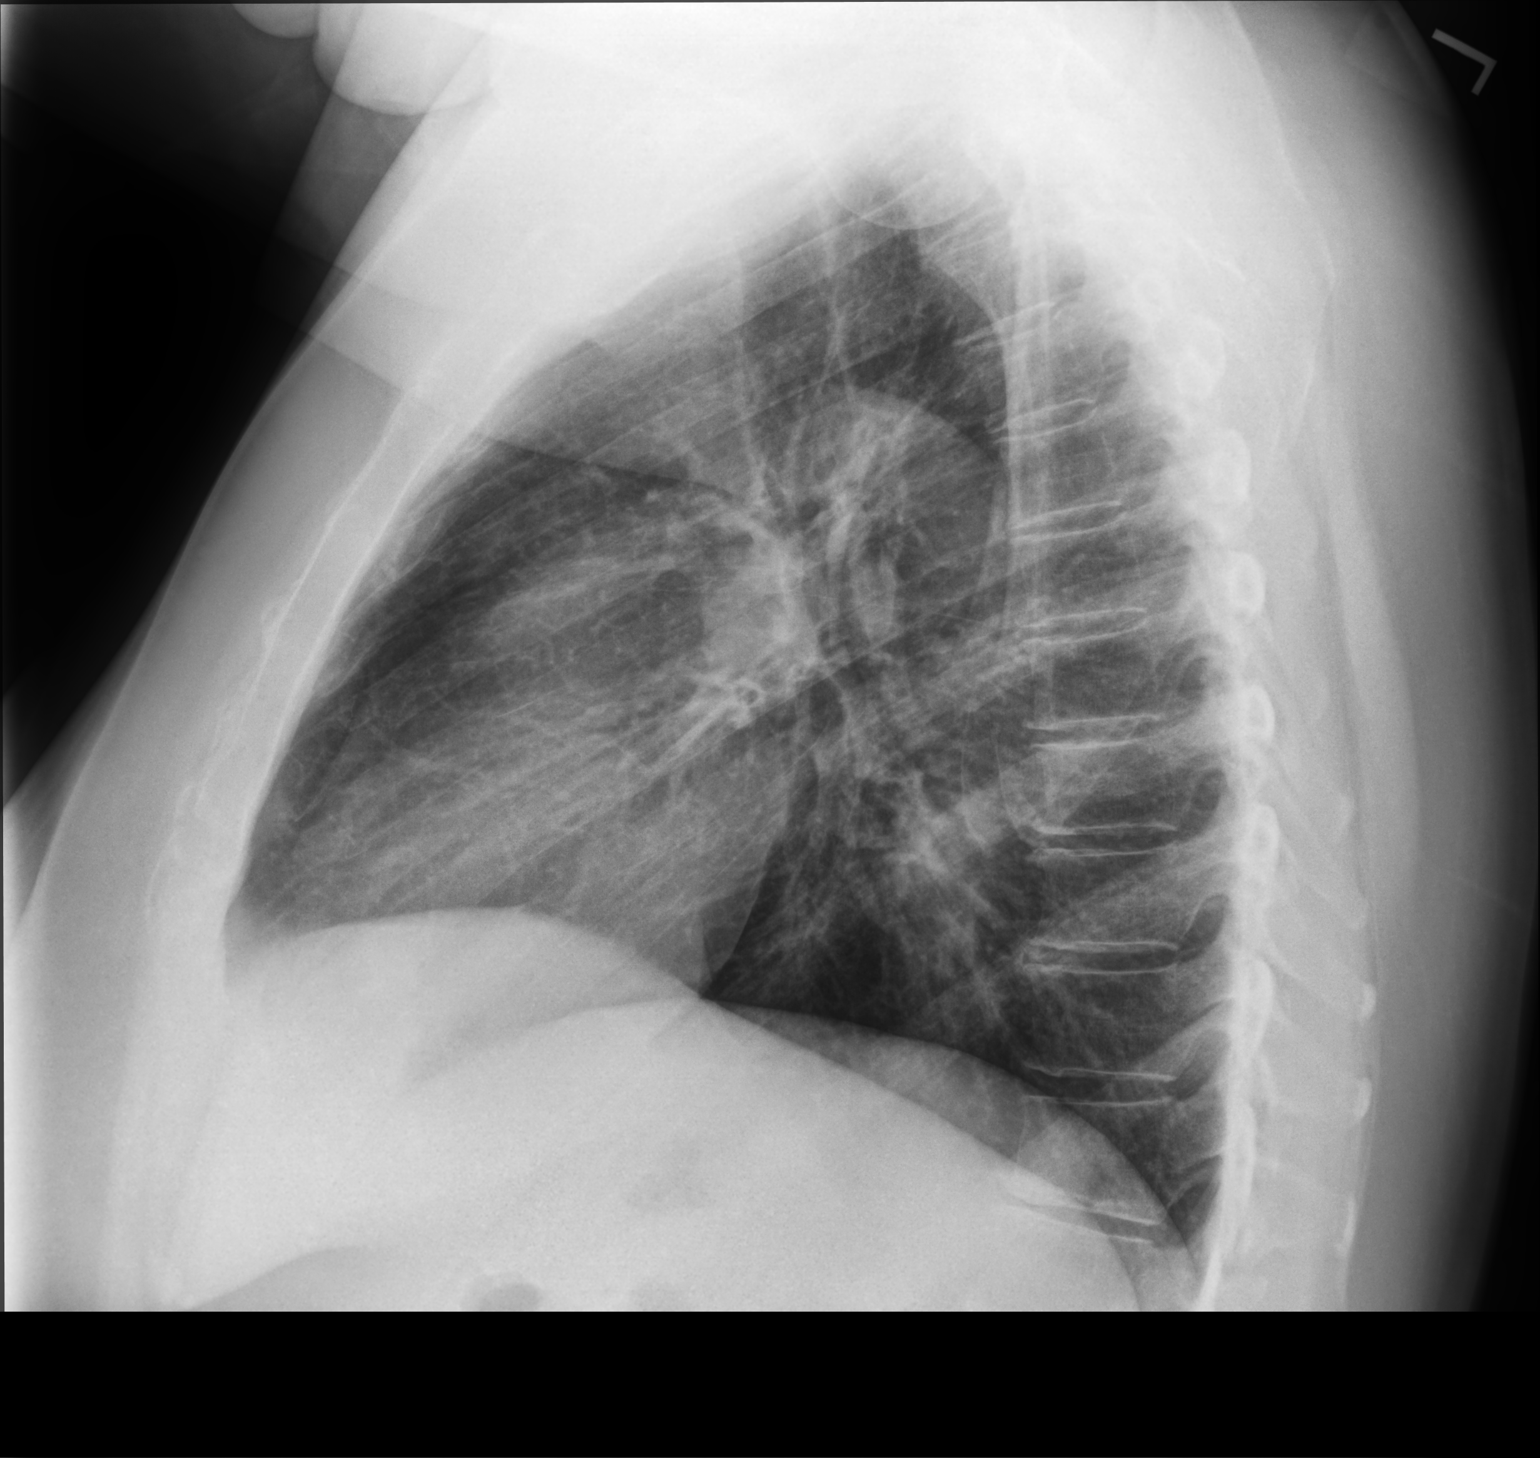

[2 of 2 positions shown; findings below may reference images not displayed]

FINDINGS: Heart size within normal limits. No appreciable airspace
consolidation. No evidence of pleural effusion or pneumothorax. No
acute bony abnormality identified.
IMPRESSION: No evidence of active cardiopulmonary disease.

## 2022-07-14 MED ORDER — METFORMIN HCL 1000 MG PO TABS: ORAL_TABLET | ORAL | 3 refills | Status: DC

## 2022-07-17 NOTE — Telephone Encounter (Signed)
Contacted patient via telephone stated feeling winded and tired with chest congestion today.  Hasn't used albuterol just symbicort BID.  It was her first day going outside of the house today.  Got food to go and walked around a [employer] store afterwards and was exhausted with that little activity.  She plans to go into work tomorrow but doesn't think she will be able to complete an entire shire.  Has yellow sputum; sinus pain/pressure/headache almost resolved after 1 day of doxycycline.  Denied wheezing/fever/n/v/d/chills in previous 48 hours.  Discussed with patient I recommend she use her albuterol inhaler especially if protracted coughing am/pm prior to symbicort "I have to get everything coughed out"  I recommended stay8ing home 17 Jul 2022, hydrating, rest and reevaluation with RN Kimrey on Monday via telephone.  Congestion audible during 10 minute telephone call.  Encouraged hydration, rest, avoid skipping meals.  Notified patient I would send message to supervisor [facility] and HR regarding work tomorrow expected return Tuesday 9 Jan at this time.  Patient A&Ox3 spoke full sentences without difficulty  Patient verbalized understanding information/instructions, agreed with plan of care and had no further questions at this time.

## 2022-07-18 ENCOUNTER — Encounter: Payer: Self-pay | Admitting: Registered Nurse

## 2022-07-18 ENCOUNTER — Ambulatory Visit: Payer: No Typology Code available for payment source | Admitting: Registered Nurse

## 2022-07-18 VITALS — BP 142/100 | HR 103 | Temp 98.5°F

## 2022-07-18 DIAGNOSIS — J4541 Moderate persistent asthma with (acute) exacerbation: Secondary | ICD-10-CM

## 2022-07-18 DIAGNOSIS — J069 Acute upper respiratory infection, unspecified: Secondary | ICD-10-CM

## 2022-07-18 DIAGNOSIS — I10 Essential (primary) hypertension: Secondary | ICD-10-CM
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
## 2022-07-19 NOTE — Telephone Encounter (Signed)
See office note 07/18/2022
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
## 2022-07-25 ENCOUNTER — Ambulatory Visit: Payer: No Typology Code available for payment source | Admitting: Registered Nurse

## 2022-07-26 LAB — HM MAMMOGRAPHY

## 2022-08-02 ENCOUNTER — Other Ambulatory Visit: Payer: Self-pay | Admitting: Internal Medicine

## 2022-08-03 ENCOUNTER — Encounter: Payer: Self-pay | Admitting: Internal Medicine

## 2022-08-04 ENCOUNTER — Ambulatory Visit: Payer: No Typology Code available for payment source | Admitting: Internal Medicine

## 2022-08-04 ENCOUNTER — Encounter: Payer: Self-pay | Admitting: Internal Medicine

## 2022-08-04 VITALS — BP 160/100 | HR 83 | Temp 97.9°F | Ht 71.0 in | Wt 220.0 lb

## 2022-08-04 DIAGNOSIS — J4531 Mild persistent asthma with (acute) exacerbation: Secondary | ICD-10-CM | POA: Diagnosis not present

## 2022-08-04 DIAGNOSIS — E118 Type 2 diabetes mellitus with unspecified complications: Secondary | ICD-10-CM

## 2022-08-04 DIAGNOSIS — F419 Anxiety disorder, unspecified: Secondary | ICD-10-CM | POA: Diagnosis not present

## 2022-08-04 DIAGNOSIS — I10 Essential (primary) hypertension: Secondary | ICD-10-CM

## 2022-08-04 DIAGNOSIS — F32A Depression, unspecified: Secondary | ICD-10-CM

## 2022-08-04 LAB — POCT GLYCOSYLATED HEMOGLOBIN (HGB A1C): HbA1c POC (<> result, manual entry): 5.6 % (ref 4.0–5.6)

## 2022-08-04 MED ORDER — LOSARTAN POTASSIUM 50 MG PO TABS: 50.0000 mg | ORAL_TABLET | Freq: Every day | ORAL | 3 refills | Status: DC

## 2022-08-04 MED ORDER — BUPROPION HCL ER (XL) 150 MG PO TB24: 150.0000 mg | ORAL_TABLET | Freq: Every day | ORAL | 0 refills | Status: DC

## 2022-08-04 MED ORDER — PREDNISONE 20 MG PO TABS: 40.0000 mg | ORAL_TABLET | Freq: Every day | ORAL | 0 refills | Status: DC

## 2022-08-04 NOTE — Assessment & Plan Note (Signed)
Moderate exacerbation. Her work environment is very stressful and this is not well. She is taking lexapro 20 mg daily. We will stop this and start wellbutrin 150 mg daily. After 3-4 weeks she will follow up with [place] and let us know. If still uncontrolled we will increase to 300 mg daily wellbutrin.

## 2022-08-04 NOTE — Patient Instructions (Addendum)
We can do the FMLA papers when you get them to [place].   We will stop the lexapro (escitalopram). We will try wellbutrin to take 1 pill daily. Let us know in 3-4 weeks if you want to increase to the 300 mg dose.   We have sent in losartan to take 1 pill daily.

## 2022-08-04 NOTE — Assessment & Plan Note (Signed)
BP markedly elevated and suspect stress and work environment contributing. She has previously been on BP medication. We will resume losartan 50 mg daily and needs follow up in 1 month to assess efficacy and check BMP.

## 2022-08-04 NOTE — Progress Notes (Signed)
   Subjective:   Patient ID: Kelly Reed, female    DOB: 01-19-1966, 57 y.o.   MRN: [id number]  HPI The patient is a 57 YO female coming in for follow up with concerns. Needs FMLA papers for asthma 2 episodes up to 5 days per episode.   Review of Systems  Constitutional:  Positive for activity change.  HENT:  Positive for congestion, ear pain, rhinorrhea, sinus pressure and sinus pain.   Eyes: Negative.   Respiratory:  Positive for chest tightness. Negative for cough and shortness of breath.   Cardiovascular:  Negative for chest pain, palpitations and leg swelling.  Gastrointestinal:  Negative for abdominal distention, abdominal pain, constipation, diarrhea, nausea and vomiting.  Musculoskeletal: Negative.   Skin: Negative.   Neurological:  Positive for headaches.  Psychiatric/Behavioral:  Positive for dysphoric mood and sleep disturbance. Negative for behavioral problems and decreased concentration. The patient is nervous/anxious.     Objective:  Physical Exam Constitutional:      Appearance: She is well-developed.  HENT:     Head: Normocephalic and atraumatic.     Comments: Oropharynx with redness and clear drainage, nose with swollen turbinates, TMs normal bilaterally.  Neck:     Thyroid: No thyromegaly.  Cardiovascular:     Rate and Rhythm: Normal rate and regular rhythm.  Pulmonary:     Effort: Pulmonary effort is normal. No respiratory distress.     Breath sounds: Normal breath sounds. No wheezing or rales.  Abdominal:     General: Bowel sounds are normal. There is no distension.     Palpations: Abdomen is soft.     Tenderness: There is no abdominal tenderness. There is no rebound.  Musculoskeletal:        General: Tenderness present.     Cervical back: Normal range of motion.  Lymphadenopathy:     Cervical: No cervical adenopathy.  Skin:    General: Skin is warm and dry.  Neurological:     Mental Status: She is alert and oriented to person, place, and time.      Coordination: Coordination normal.     Vitals:   08/04/22 0759 08/04/22 0808  BP: (!) 160/100 (!) 160/100  Pulse: 83   Temp: 97.9 F (36.6 C)   TempSrc: Oral   SpO2: 97%   Weight: 220 lb (99.8 kg)   Height: '5\' 11"'$  (1.803 m)     Assessment & Plan:

## 2022-08-04 NOTE — Assessment & Plan Note (Signed)
POC HGA1c done as last time was above goal of 7.5. Today's is 6.9 and she has quit sodas since last visit. Encouraged to maintain these changes. Continue metformin 2353 mg BID and trulicity 3 mg weekly. On ARB (starting today) and statin.

## 2022-08-04 NOTE — Assessment & Plan Note (Signed)
With sinus infection/flare starting today. She has severe asthma symptoms. Rx prednisone 5 day course. Continue xyzal, montelukast, flonase, symbicort. She has nebulizer to use prn if needed. FLMA will be updated once she sends it to [place] with 2 episodes 5 days each episode per month. 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

## 2022-08-04 NOTE — Telephone Encounter (Signed)
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  A&Ox3 respirations even and unlabored spoke full sentences without difficulty no dyspnea/wheezing/cough/nasal congestion or throat clearing observed.  Gait sure and steady.  Skin warm dry and pink

## 2022-08-08 ENCOUNTER — Telehealth: Payer: Self-pay | Admitting: Internal Medicine

## 2022-08-08 NOTE — Telephone Encounter (Signed)
For our records:  We have received FMLA forms for the pt and they have been placed in Dr. Nathanial Millman boxes.   Upon completion please fax to:  [phone]

## 2022-08-09 NOTE — Telephone Encounter (Signed)
Forms have been placed in Dr crawford's office box

## 2022-08-11 ENCOUNTER — Telehealth: Payer: Self-pay

## 2022-08-11 ENCOUNTER — Telehealth: Payer: Self-pay | Admitting: Internal Medicine

## 2022-08-11 DIAGNOSIS — Z0289 Encounter for other administrative examinations: Secondary | ICD-10-CM

## 2022-08-11 NOTE — Telephone Encounter (Signed)
Pt called Requesting for the FMLA form to be Fax to: [phone] ASAP because it has to be submitted by 7 days.  Pt also  states that should like call back with a update

## 2022-08-11 NOTE — Telephone Encounter (Signed)
I have completed and place this up front for patient to pick up .

## 2022-08-14 NOTE — Telephone Encounter (Signed)
I have faxed the forms back to the number patient requested.

## 2022-08-15 ENCOUNTER — Ambulatory Visit: Payer: No Typology Code available for payment source | Admitting: Occupational Medicine

## 2022-08-15 ENCOUNTER — Encounter: Payer: Self-pay | Admitting: Registered Nurse

## 2022-08-15 ENCOUNTER — Telehealth: Payer: Self-pay | Admitting: Registered Nurse

## 2022-08-15 VITALS — BP 118/91 | HR 104 | Temp 97.8°F

## 2022-08-15 DIAGNOSIS — E118 Type 2 diabetes mellitus with unspecified complications: Secondary | ICD-10-CM

## 2022-08-15 DIAGNOSIS — R110 Nausea: Secondary | ICD-10-CM

## 2022-08-15 DIAGNOSIS — R197 Diarrhea, unspecified: Secondary | ICD-10-CM

## 2022-08-15 LAB — GLUCOSE, POCT (MANUAL RESULT ENTRY): POC Glucose: 187 mg/dl — AB (ref 70–99)

## 2022-08-15 MED ORDER — CALCIUM CARBONATE ANTACID 500 MG PO CHEW: 1.0000 | CHEWABLE_TABLET | Freq: Three times a day (TID) | ORAL | Status: DC | PRN

## 2022-08-15 MED ORDER — ONDANSETRON HCL 8 MG PO TABS: 8.0000 mg | ORAL_TABLET | Freq: Three times a day (TID) | ORAL | 0 refills | Status: AC | PRN

## 2022-08-15 NOTE — Progress Notes (Signed)
Patient reports having diarrhea 6 times this morning very nauseous and cramping. No fever. Patient reports feels very clammy. CBG 187 which is high for her. Covid test negative. Patient send home. Educated had to be nausea diarrhea free for 24 hrs before coming back to work Tentative RTW 08/17/22. Will follow up tomorrow. Hr and supervisor made aware. Patient educated on staying hydrated drinking water and G@ because Diabetic. Only eating BRAT diet for stomach to recover.

## 2022-08-16 NOTE — Telephone Encounter (Signed)
Patient reports no vomiting since yesterday. Able to hold food down now. Reports feeling better. Occasional stomach cramps. Educated to rest and hydrate. Patient able to RTW if no vomiting through evening and night. HR and Supervisor made aware RTW 2/8 unless something changes and to follow normal call out procedure. Will follow up tomorrow.

## 2022-08-17 NOTE — Telephone Encounter (Signed)
Patient called out today was unable to RTW today. Called to follow up. Patient reports started vomiting again last night and this am. Reports still having food cramping. Encouraged to stay hydrated. Extended RTW to 08/21/22. Educated to email clinic if needs anything.

## 2022-08-20 NOTE — Telephone Encounter (Signed)
Spoke with patient via telephone stated nausea and diarrhea resolved.  Denied fever/chills/n/v/d in previous 24 hours.  Feels well enough to return to work.  Spouse had same symptoms as she did and resolved also now.  A&Ox3 spoke full sentences without difficulty.  Tolerating po intake without difficulty.  Notified patient will send message to HR and supervisor cleared to RTW tomorrow onsite.  Patient verbalized understanding information/instructions, agreed with plan of care and had no further questions at this time.  Duration of call 2 minutes

## 2022-08-21 NOTE — Telephone Encounter (Signed)
Patient back at work doing well fully recovered.

## 2022-09-04 ENCOUNTER — Telehealth: Payer: Self-pay | Admitting: Registered Nurse

## 2022-09-04 DIAGNOSIS — M545 Low back pain, unspecified: Secondary | ICD-10-CM

## 2022-09-04 DIAGNOSIS — M62830 Muscle spasm of back: Secondary | ICD-10-CM

## 2022-09-04 MED ORDER — CYCLOBENZAPRINE HCL 10 MG PO TABS: 10.0000 mg | ORAL_TABLET | Freq: Three times a day (TID) | ORAL | 0 refills | Status: DC | PRN

## 2022-09-04 NOTE — Telephone Encounter (Signed)
Patient returned call stated low back pain worse on right muscle spasms.  Has run out of flexeril would like refill.  Electronic Rx sent to her pharmacy of choice '10mg'$  po TID prn muscle spasms #30 RF0.  Avoid driving after taking as causes drowsiness and avoid alcohol intake when taking flexeril.  Slow position changes as it lowers blood pressure also lying to sitting to standing recommended and feel pumps before standing up.  Patient denied extremity weakness, loss of bowel/bladder control or saddle paresthesias.  Woke up with pain.  Planning to rest at home today and will follow up with clinic staff again tomorrow.  Denied further questions or concerns at this time.  Discussed stretching and heat also for muscle spasms.  Patient A&Ox3 spoke full sentences without difficulty; verbalized understanding information/instructions, agreed with plan of care and had no further questions at this time.  RN Evlyn Kanner notfiied NP of patient refill request this am.

## 2022-09-13 ENCOUNTER — Other Ambulatory Visit: Payer: Self-pay

## 2022-09-13 ENCOUNTER — Emergency Department (HOSPITAL_COMMUNITY): Payer: No Typology Code available for payment source

## 2022-09-13 ENCOUNTER — Emergency Department (HOSPITAL_COMMUNITY)
Admission: EM | Admit: 2022-09-13 | Discharge: 2022-09-13 | Disposition: A | Discharge status: Home / Self Care | Payer: No Typology Code available for payment source | Attending: Emergency Medicine | Admitting: Emergency Medicine

## 2022-09-13 ENCOUNTER — Encounter (HOSPITAL_COMMUNITY): Payer: Self-pay

## 2022-09-13 DIAGNOSIS — Y92410 Unspecified street and highway as the place of occurrence of the external cause: Secondary | ICD-10-CM | POA: Diagnosis not present

## 2022-09-13 DIAGNOSIS — Z7982 Long term (current) use of aspirin: Secondary | ICD-10-CM | POA: Insufficient documentation

## 2022-09-13 DIAGNOSIS — S199XXA Unspecified injury of neck, initial encounter: Secondary | ICD-10-CM | POA: Diagnosis present

## 2022-09-13 DIAGNOSIS — S139XXA Sprain of joints and ligaments of unspecified parts of neck, initial encounter: Secondary | ICD-10-CM

## 2022-09-13 DIAGNOSIS — Z794 Long term (current) use of insulin: Secondary | ICD-10-CM | POA: Diagnosis not present

## 2022-09-13 DIAGNOSIS — S134XXA Sprain of ligaments of cervical spine, initial encounter: Secondary | ICD-10-CM | POA: Insufficient documentation

## 2022-09-13 MED ORDER — IBUPROFEN 600 MG PO TABS: 600.0000 mg | ORAL_TABLET | Freq: Four times a day (QID) | ORAL | 0 refills | Status: AC | PRN

## 2022-09-13 MED ORDER — NAPROXEN 500 MG PO TABS
500.0000 mg | ORAL_TABLET | Freq: Once | ORAL | Status: AC
  Administered 2022-09-13: 500 mg via ORAL
  Filled 2022-09-13: qty 1

## 2022-09-13 MED ORDER — ACETAMINOPHEN 325 MG PO TABS
650.0000 mg | ORAL_TABLET | Freq: Once | ORAL | Status: AC
  Administered 2022-09-13: 650 mg via ORAL
  Filled 2022-09-13: qty 2

## 2022-09-13 MED ORDER — METHOCARBAMOL 500 MG PO TABS: 500.0000 mg | ORAL_TABLET | Freq: Two times a day (BID) | ORAL | 0 refills | Status: DC
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
## 2022-09-14 ENCOUNTER — Telehealth: Payer: Self-pay
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
## 2022-09-24 ENCOUNTER — Ambulatory Visit
Admission: EM | Admit: 2022-09-24 | Discharge: 2022-09-24 | Disposition: A | Discharge status: Home / Self Care | Payer: No Typology Code available for payment source | Attending: Urgent Care | Admitting: Urgent Care

## 2022-09-24 DIAGNOSIS — R609 Edema, unspecified: Secondary | ICD-10-CM

## 2022-09-24 DIAGNOSIS — I10 Essential (primary) hypertension: Secondary | ICD-10-CM

## 2022-09-24 MED ORDER — FUROSEMIDE 40 MG PO TABS: 40.0000 mg | ORAL_TABLET | Freq: Every day | ORAL | 0 refills | Status: DC

## 2022-09-24 MED ORDER — POTASSIUM CHLORIDE ER 10 MEQ PO TBCR: 10.0000 meq | EXTENDED_RELEASE_TABLET | Freq: Every day | ORAL | 0 refills | Status: DC

## 2022-09-24 NOTE — ED Provider Notes (Signed)
[facility]  Note:  This document was prepared using Systems analyst and may include unintentional dictation errors.  MRN: [id number] DOB: 1966/04/10  Subjective:   Kelly Reed is a 57 y.o. female presenting for 2-day history of acute onset persistent swelling of the feet, ankles, lower legs, hands, neck and face.  No fever, headache, confusion, weakness, numbness or tingling, chest pain, shortness of breath or dyspnea, nausea, vomiting, abdominal pain, hematuria.  No history of congestive heart failure.  Patient has not taken prednisone recently.  No history of kidney disease.  Patient is not on amlodipine.  No new medications.  No rashes or hives.  Patient has had allergic reactions before and feels like this is not what is happening to her.  She does have a PCP she can follow-up with.  No current facility-administered medications for this encounter.  Current Outpatient Medications:    albuterol (VENTOLIN HFA) 108 (90 Base) MCG/ACT inhaler, INHALE 2 PUFFS BY MOUTH INTO THE LUNGS EVERY 6 HOURS AS NEEDED WHEEZING OR SHORTNESS OF BREATH, Disp: 18 g, Rfl: 1   aspirin EC 81 MG tablet, Take 1 tablet (81 mg total) by mouth daily., Disp: , Rfl:    budesonide-formoterol (SYMBICORT) 160-4.5 MCG/ACT inhaler, Inhale 2 puffs into the lungs 2 (two) times daily., Disp: , Rfl:    buPROPion (WELLBUTRIN XL) 150 MG 24 hr tablet, Take 1 tablet (150 mg total) by mouth daily., Disp: 90 tablet, Rfl: 0   calcium carbonate (TUMS) 500 MG chewable tablet, Chew 1 tablet (200 mg of elemental calcium total) by mouth 3 (three) times daily as needed for indigestion or heartburn., Disp: , Rfl:    cyclobenzaprine (FLEXERIL) 10 MG tablet, Take 1 tablet (10 mg total) by mouth 3 (three) times daily as needed for muscle spasms., Disp: 30 tablet, Rfl: 0   Dulaglutide (TRULICITY) 3 0000000 SOPN, Inject 3 mg as directed once a week., Disp: 2 mL, Rfl: 3   fluticasone (FLONASE) 50 MCG/ACT  nasal spray, Place 1 spray into both nostrils 2 (two) times daily., Disp: 16 g, Rfl: 6   ibuprofen (ADVIL) 600 MG tablet, Take 1 tablet (600 mg total) by mouth every 6 (six) hours as needed., Disp: 20 tablet, Rfl: 0   levocetirizine (XYZAL) 5 MG tablet, Take 1 tablet by mouth every evening. , Disp: , Rfl: 9   losartan (COZAAR) 50 MG tablet, Take 1 tablet (50 mg total) by mouth daily., Disp: 90 tablet, Rfl: 3   MAGNESIUM PO, Take by mouth., Disp: , Rfl:    metFORMIN (GLUCOPHAGE) 1000 MG tablet, TAKE 1 TABLET(1000 MG) BY MOUTH TWICE DAILY WITH A MEAL, Disp: 180 tablet, Rfl: 3   methocarbamol (ROBAXIN) 500 MG tablet, Take 1 tablet (500 mg total) by mouth 2 (two) times daily., Disp: 10 tablet, Rfl: 0   montelukast (SINGULAIR) 10 MG tablet, TAKE 1 TABLET(10 MG) BY MOUTH AT BEDTIME, Disp: 90 tablet, Rfl: 3   omeprazole (PRILOSEC) 20 MG capsule, Take 20 mg by mouth 2 (two) times daily., Disp: , Rfl:    predniSONE (DELTASONE) 20 MG tablet, Take 2 tablets (40 mg total) by mouth daily with breakfast., Disp: 10 tablet, Rfl: 0   rosuvastatin (CRESTOR) 10 MG tablet, Take 1 tablet (10 mg total) by mouth daily., Disp: 90 tablet, Rfl: 3   albuterol (VENTOLIN HFA) 108 (90 Base) MCG/ACT inhaler, Inhale 2 puffs into the lungs every 4 (four) hours as needed for wheezing or shortness of breath., Disp: 8  g, Rfl: 0   sodium chloride (OCEAN) 0.65 % SOLN nasal spray, Place 2 sprays into both nostrils every 2 (two) hours while awake., Disp: , Rfl: 0   Allergies  Allergen Reactions   Other     Other reaction(s): skin peeling   Adhesive [Tape] Rash and Other (See Comments)    Peeling of skin    Past Medical History:  Diagnosis Date   Allergy    Anemia    Anxiety    Asthma    COVID-19 04/29/2019   COVID-19 virus detected 04/11/2019   Diabetes mellitus type II    GERD (gastroesophageal reflux disease)    Hyperlipidemia    Hypertension    NSVD (normal spontaneous vaginal delivery)    x 2     Past Surgical  History:  Procedure Laterality Date   GANGLION CYST EXCISION     left foot   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 ultrasound  12/04   negative   Surg. eval lap cholecstectomy  05/28/2006   deferred by patient   [place]   bilateral    Family History  Problem Relation Age of Onset   Breast cancer Mother 70       pt's mother passed away from breast cancer when pt was 57 yo   Diabetes Maternal Grandmother    Stomach cancer Paternal Grandmother    Colon cancer Neg Hx    Esophageal cancer Neg Hx    Rectal cancer Neg Hx    Irritable bowel syndrome Neg Hx     Social History   Tobacco Use   Smoking status: Never   Smokeless tobacco: Never  Vaping Use   Vaping Use: Never used  Substance Use Topics   Alcohol use: Not Currently   Drug use: No    ROS   Objective:   Vitals: BP (!) 163/99 (BP Location: Right Arm)   Pulse 96   Temp 98.5 F (36.9 C) (Oral)   Resp 17   Ht 5\' 11"  (1.803 m)   Wt 220 lb (99.8 kg)   SpO2 96%   BMI 30.68 kg/m   BP Readings from Last 3 Encounters:  09/24/22 (!) 163/99  09/13/22 (!) 145/105  08/15/22 (!) 118/91   Physical Exam Constitutional:      General: She is not in acute distress.    Appearance: Normal appearance. She is well-developed. She is not ill-appearing, toxic-appearing or diaphoretic.  HENT:     Head: Normocephalic and atraumatic.     Right Ear: External ear normal.     Left Ear: External ear normal.     Nose: Nose normal.     Mouth/Throat:     Mouth: Mucous membranes are moist.     Pharynx: No pharyngeal swelling, oropharyngeal exudate, posterior oropharyngeal erythema or uvula swelling.     Tonsils: No tonsillar exudate or tonsillar abscesses. 0 on the right. 0 on the left.     Comments: No oral or pharyngeal swelling.  Airway is patent. Eyes:     General: No scleral icterus.       Right eye: No discharge.        Left eye: No discharge.     Extraocular Movements:  Extraocular movements intact.  Neck:     Comments: Trace swelling about the neck.  No tenderness or cervical adenopathy. Cardiovascular:     Rate and Rhythm: Normal rate and regular rhythm.     Heart sounds:  Normal heart sounds. No murmur heard.    No friction rub. No gallop.  Pulmonary:     Effort: Pulmonary effort is normal. No respiratory distress.     Breath sounds: No stridor. No wheezing, rhonchi or rales.  Chest:     Chest wall: No tenderness.  Musculoskeletal:     Right lower leg: Edema present.     Left lower leg: Edema present.  Skin:    General: Skin is warm and dry.  Neurological:     General: No focal deficit present.     Mental Status: She is alert and oriented to person, place, and time.  Psychiatric:        Mood and Affect: Mood normal.        Behavior: Behavior normal.      Assessment and Plan :   PDMP not reviewed this encounter.  1. Peripheral edema   2. Essential hypertension     Will pursue blood work.  Advised that we would only follow-up with abnormal results.  Recommended she request an urgent follow-up with her PCP.  Start furosemide 40 mg for 5 days, use potassium supplementation 10 mEq for 5 days as well.  Maintain strict ER precautions.  Offered imaging but patient declined and I am in agreement given a clear cardiopulmonary exam and overall hemodynamically stable vital signs.   Jaynee Eagles, [place] 09/24/22 [phone]

## 2022-09-24 NOTE — ED Triage Notes (Signed)
X2 days   Pt states that she has some facial, feet, ankle and hand swelling.

## 2022-09-24 NOTE — Discharge Instructions (Addendum)
Please follow-up with your regular doctor soon as possible.  If we have abnormal blood test results tomorrow, we will call you and review them with you.  Otherwise no news is good news.  For now, start furosemide to help you with the swelling.  Take potassium supplementation together with it as furosemide can lower your potassium level.  If you develop chest pain, shortness of breath, fast pulse, blood in your urine then please present to the emergency room.

## 2022-09-25 ENCOUNTER — Ambulatory Visit: Payer: No Typology Code available for payment source | Admitting: Occupational Medicine

## 2022-09-25 VITALS — BP 118/78 | HR 107 | Wt 215.0 lb

## 2022-09-25 DIAGNOSIS — T50905A Adverse effect of unspecified drugs, medicaments and biological substances, initial encounter: Secondary | ICD-10-CM

## 2022-09-25 DIAGNOSIS — I10 Essential (primary) hypertension: Secondary | ICD-10-CM

## 2022-09-25 DIAGNOSIS — R609 Edema, unspecified: Secondary | ICD-10-CM

## 2022-09-25 NOTE — Progress Notes (Signed)
Patient reports seen at [facility] on Sunday due to elevated BP, edema to face, neck and legs. Put on lasix and potassium. Today wanted BP check feeling like hot flash, sweaty, thirsty and dry mouth. Educated most of those symptoms are from the lasix. Noted compression stocking to leg. Encourage to elevate legs at home. Gave ice pack to help with hot flash. Scheduled appt with Otila Kluver NP tomorrow. Educated if anything changes or worsens to contact the clinic.

## 2022-09-25 NOTE — Telephone Encounter (Signed)
Notified by RN Evlyn Kanner patient seen at [facility] this weekend and started on lasix.  Having dry mouth at work today BP stable.  Discussed with RN Evlyn Kanner to weigh patient and she is scheduled for appt with me tomorrow for re-evaluation.

## 2022-09-26 ENCOUNTER — Ambulatory Visit: Payer: No Typology Code available for payment source | Admitting: Registered Nurse

## 2022-09-26 ENCOUNTER — Encounter: Payer: Self-pay | Admitting: Registered Nurse

## 2022-09-26 VITALS — BP 110/74 | HR 102 | Wt 212.0 lb

## 2022-09-26 DIAGNOSIS — Z0000 Encounter for general adult medical examination without abnormal findings: Secondary | ICD-10-CM

## 2022-09-26 LAB — CBC
Hematocrit: 39 % (ref 34.0–46.6)
Hemoglobin: 13 g/dL (ref 11.1–15.9)
MCH: 29.3 pg (ref 26.6–33.0)
MCHC: 33.3 g/dL (ref 31.5–35.7)
MCV: 88 fL (ref 79–97)
Platelets: 270 10*3/uL (ref 150–450)
RBC: 4.44 x10E6/uL (ref 3.77–5.28)
RDW: 13.1 % (ref 11.7–15.4)
WBC: 6.5 10*3/uL (ref 3.4–10.8)

## 2022-09-26 LAB — COMPREHENSIVE METABOLIC PANEL
ALT: 54 IU/L — ABNORMAL HIGH (ref 0–32)
AST: 42 IU/L — ABNORMAL HIGH (ref 0–40)
Albumin/Globulin Ratio: 2 (ref 1.2–2.2)
Albumin: 4.7 g/dL (ref 3.8–4.9)
Alkaline Phosphatase: 89 IU/L (ref 44–121)
BUN/Creatinine Ratio: 14 (ref 9–23)
BUN: 10 mg/dL (ref 6–24)
Bilirubin Total: <0.2 mg/dL (ref 0.0–1.2)
CO2: 23 mmol/L (ref 20–29)
Calcium: 9.6 mg/dL (ref 8.7–10.2)
Chloride: 102 mmol/L (ref 96–106)
Creatinine, Ser: 0.71 mg/dL (ref 0.57–1.00)
Globulin, Total: 2.3 g/dL (ref 1.5–4.5)
Glucose: 137 mg/dL — ABNORMAL HIGH (ref 70–99)
Potassium: 4.3 mmol/L (ref 3.5–5.2)
Sodium: 140 mmol/L (ref 134–144)
Total Protein: 7 g/dL (ref 6.0–8.5)
eGFR: 98 mL/min/{1.73_m2} (ref >59)

## 2022-09-26 LAB — TSH: TSH: 0.995 u[IU]/mL (ref 0.450–4.500)

## 2022-09-26 NOTE — Patient Instructions (Signed)
Edema  Edema is an abnormal buildup of fluids in the body tissues and under the skin. Swelling of the legs, feet, and ankles is a common symptom that becomes more likely as you get older. Swelling is also common in looser tissues, such as around the eyes. Pressing on the area may make a temporary dent in your skin (pitting edema). This fluid may also accumulate in your lungs (pulmonary edema). There are many possible causes of edema. Eating too much salt (sodium) and being on your feet or sitting for a long time can cause edema in your legs, feet, and ankles. Common causes of edema include: Certain medical conditions, such as heart failure, liver or kidney disease, and cancer. Weak leg blood vessels. An injury. Pregnancy. Medicines. Being obese. Low protein levels in the blood. Hot weather may make edema worse. Edema is usually painless. Your skin may look swollen or shiny. Follow these instructions at home: Medicines Take over-the-counter and prescription medicines only as told by your health care provider. Your health care provider may prescribe a medicine to help your body get rid of extra water (diuretic). Take this medicine if you are told to take it. Eating and drinking Eat a low-salt (low-sodium) diet to reduce fluid as told by your health care provider. Sometimes, eating less salt may reduce swelling. Depending on the cause of your swelling, you may need to limit how much fluid you drink (fluid restriction). General instructions Raise (elevate) the injured area above the level of your heart while you are sitting or lying down. Do not sit still or stand for long periods of time. Do not wear tight clothing. Do not wear garters on your upper legs. Exercise your legs to get your circulation going. This helps to move the fluid back into your blood vessels, and it may help the swelling go down. Wear compression stockings as told by your health care provider. These stockings help to prevent  blood clots and reduce swelling in your legs. It is important that these are the correct size. These stockings should be prescribed by your health care provider to prevent possible injuries. If elastic bandages or wraps are recommended, use them as told by your health care provider. Contact a health care provider if: Your edema does not get better with treatment. You have heart, liver, or kidney disease and have symptoms of edema. You have sudden and unexplained weight gain. Get help right away if: You develop shortness of breath or chest pain. You cannot breathe when you lie down. You develop pain, redness, or warmth in the swollen areas. You have heart, liver, or kidney disease and suddenly get edema. You have a fever and your symptoms suddenly get worse. These symptoms may be an emergency. Get help right away. Call 911. Do not wait to see if the symptoms will go away. Do not drive yourself to the hospital. Summary Edema is an abnormal buildup of fluids in the body tissues and under the skin. Eating too much salt (sodium)and being on your feet or sitting for a long time can cause edema in your legs, feet, and ankles. Raise (elevate) the injured area above the level of your heart while you are sitting or lying down. Follow your health care provider's instructions about diet and how much fluid you can drink. This information is not intended to replace advice given to you by your health care provider. Make sure you discuss any questions you have with your health care provider. Document Revised: 02/28/2021 Document   Reviewed: 02/28/2021 Elsevier Patient Education  2023 [employer]  
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
## 2022-09-27 ENCOUNTER — Other Ambulatory Visit: Payer: Self-pay | Admitting: Internal Medicine

## 2022-09-27 ENCOUNTER — Ambulatory Visit: Payer: No Typology Code available for payment source | Admitting: Occupational Medicine

## 2022-09-27 VITALS — Wt 213.2 lb

## 2022-09-27 DIAGNOSIS — E1165 Type 2 diabetes mellitus with hyperglycemia: Secondary | ICD-10-CM

## 2022-09-27 DIAGNOSIS — R609 Edema, unspecified: Secondary | ICD-10-CM

## 2022-09-27 NOTE — Progress Notes (Signed)
Gained 1.2 lbs in day. Patient continues to monitor swelling.

## 2022-09-28 ENCOUNTER — Telehealth: Payer: Self-pay | Admitting: Internal Medicine

## 2022-09-28 NOTE — Telephone Encounter (Signed)
We can switch to ozempic or bydureon if she wants.

## 2022-09-28 NOTE — Telephone Encounter (Signed)
Patient wants to know if there is something besides  Dulaglutide (TRULICITY) 3 0000000 SOPN that can be prescribed for her. There is a Event organiser and her pharmacy and the other pharmacies she has contacted do not have it. She would like a call back at [phone].

## 2022-10-03 ENCOUNTER — Encounter: Payer: Self-pay | Admitting: Internal Medicine

## 2022-10-03 ENCOUNTER — Ambulatory Visit: Payer: No Typology Code available for payment source | Admitting: Internal Medicine

## 2022-10-03 VITALS — BP 130/82 | HR 88 | Temp 98.4°F | Ht 71.0 in | Wt 221.0 lb

## 2022-10-03 DIAGNOSIS — E118 Type 2 diabetes mellitus with unspecified complications: Secondary | ICD-10-CM

## 2022-10-03 DIAGNOSIS — R609 Edema, unspecified: Secondary | ICD-10-CM | POA: Diagnosis not present

## 2022-10-03 DIAGNOSIS — I10 Essential (primary) hypertension: Secondary | ICD-10-CM | POA: Diagnosis not present

## 2022-10-03 DIAGNOSIS — R0602 Shortness of breath: Secondary | ICD-10-CM

## 2022-10-03 LAB — COMPREHENSIVE METABOLIC PANEL
ALT: 52 U/L — ABNORMAL HIGH (ref 0–35)
AST: 35 U/L (ref 0–37)
Albumin: 4.4 g/dL (ref 3.5–5.2)
Alkaline Phosphatase: 75 U/L (ref 39–117)
BUN: 14 mg/dL (ref 6–23)
CO2: 27 mEq/L (ref 19–32)
Calcium: 9.4 mg/dL (ref 8.4–10.5)
Chloride: 100 mEq/L (ref 96–112)
Creatinine, Ser: 0.64 mg/dL (ref 0.40–1.20)
GFR: 97.43 mL/min (ref >60.00)
Glucose, Bld: 158 mg/dL — ABNORMAL HIGH (ref 70–99)
Potassium: 3.8 mEq/L (ref 3.5–5.1)
Sodium: 138 mEq/L (ref 135–145)
Total Bilirubin: 0.4 mg/dL (ref 0.2–1.2)
Total Protein: 7.2 g/dL (ref 6.0–8.3)

## 2022-10-03 LAB — BRAIN NATRIURETIC PEPTIDE: Pro B Natriuretic peptide (BNP): 21 pg/mL (ref 0.0–100.0)

## 2022-10-03 MED ORDER — RYBELSUS 14 MG PO TABS: 14.0000 mg | ORAL_TABLET | Freq: Every day | ORAL | 3 refills | Status: DC

## 2022-10-03 NOTE — Patient Instructions (Signed)
We will check the labs today and the echo of the heart (ultrsound)  We have sent in rybelsus to take 1 pill daily.

## 2022-10-03 NOTE — Assessment & Plan Note (Addendum)
BP at goal on losartan 50 mg daily and has been taking lasix 40 mg daily for 5 days stopped about 2-3 days ago. Checking CMP again today to ensure no changes due to lasix treatment. If labs normal we will plan to adjust medication to losartan/hctz 50/12.5 mg daily.
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
## 2022-10-03 NOTE — Assessment & Plan Note (Signed)
She is unable to get trulicity 3 mg weekly due to manufacturer shortage. We will transition to rybelsus 14 mg daily instead. No titration needed as she is already on similar. Rx done today. Prior HgA1c at goal and not due for this today. Continue metformin 1000 mg BID as well. Is on ARB and statin.
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
## 2022-10-04 ENCOUNTER — Telehealth: Payer: Self-pay | Admitting: Internal Medicine

## 2022-10-04 NOTE — Telephone Encounter (Signed)
Patient called and said that Rybelsus is not covered by her insurance.  Something else will need to be sent to her pharmacy - [facility] [place] - Please call patient and let her know what is being called in.  Patient's number:  [phone]

## 2022-10-05 ENCOUNTER — Telehealth: Payer: Self-pay | Admitting: Internal Medicine

## 2022-10-05 ENCOUNTER — Other Ambulatory Visit (HOSPITAL_COMMUNITY): Payer: Self-pay

## 2022-10-05 ENCOUNTER — Ambulatory Visit: Payer: No Typology Code available for payment source | Admitting: Registered Nurse

## 2022-10-05 ENCOUNTER — Encounter: Payer: Self-pay | Admitting: Registered Nurse

## 2022-10-05 VITALS — BP 142/84 | HR 96 | Resp 20 | Wt 218.0 lb

## 2022-10-05 DIAGNOSIS — Z0000 Encounter for general adult medical examination without abnormal findings: Secondary | ICD-10-CM

## 2022-10-05 DIAGNOSIS — R609 Edema, unspecified: Secondary | ICD-10-CM

## 2022-10-05 DIAGNOSIS — R7401 Elevation of levels of liver transaminase levels: Secondary | ICD-10-CM

## 2022-10-05 MED ORDER — POTASSIUM CHLORIDE CRYS ER 20 MEQ PO TBCR: 20.0000 meq | EXTENDED_RELEASE_TABLET | Freq: Every day | ORAL | 0 refills | Status: DC | PRN

## 2022-10-05 MED ORDER — FUROSEMIDE 40 MG PO TABS: 20.0000 mg | ORAL_TABLET | Freq: Every day | ORAL | 0 refills | Status: DC | PRN

## 2022-10-05 NOTE — Patient Instructions (Signed)
Peripheral Edema  Peripheral edema is swelling that is caused by a buildup of fluid. Peripheral edema most often affects the lower legs, ankles, and feet. It can also develop in the arms, hands, and face. The area of the body that has peripheral edema will look swollen. It may also feel heavy or warm. Your clothes may start to feel tight. Pressing on the area may make a temporary dent in your skin (pitting edema). You may not be able to move your swollen arm or leg as much as usual. There are many causes of peripheral edema. It can happen because of a complication of other conditions such as heart failure, kidney disease, or a problem with your circulation. It also can be a side effect of certain medicines or happen because of an infection. It often happens to women during pregnancy. Sometimes, the cause is not known. Follow these instructions at home: Managing pain, stiffness, and swelling  Raise (elevate) your legs while you are sitting or lying down. Move around often to prevent stiffness and to reduce swelling. Do not sit or stand for long periods of time. Do not wear tight clothing. Do not wear garters on your upper legs. Exercise your legs to get your circulation going. This helps to move the fluid back into your blood vessels, and it may help the swelling go down. Wear compression stockings as told by your health care provider. These stockings help to prevent blood clots and reduce swelling in your legs. It is important that these are the correct size. These stockings should be prescribed by your doctor to prevent possible injuries. If elastic bandages or wraps are recommended, use them as told by your health care provider. Medicines Take over-the-counter and prescription medicines only as told by your health care provider. Your health care provider may prescribe medicine to help your body get rid of excess water (diuretic). Take this medicine if you are told to take it. General  instructions Eat a low-salt (low-sodium) diet as told by your health care provider. Sometimes, eating less salt may reduce swelling. Pay attention to any changes in your symptoms. Moisturize your skin daily to help prevent skin from cracking and draining. Keep all follow-up visits. This is important. Contact a health care provider if: You have a fever. You have swelling in only one leg. You have increased swelling, redness, or pain in one or both of your legs. You have drainage or sores at the area where you have edema. Get help right away if: You have edema that starts suddenly or is getting worse, especially if you are pregnant or have a medical condition. You develop shortness of breath, especially when you are lying down. You have pain in your chest or abdomen. You feel weak. You feel like you will faint. These symptoms may be an emergency. Get help right away. Call 911. Do not wait to see if the symptoms will go away. Do not drive yourself to the hospital. Summary Peripheral edema is swelling that is caused by a buildup of fluid. Peripheral edema most often affects the lower legs, ankles, and feet. Move around often to prevent stiffness and to reduce swelling. Do not sit or stand for long periods of time. Pay attention to any changes in your symptoms. Contact a health care provider if you have edema that starts suddenly or is getting worse, especially if you are pregnant or have a medical condition. Get help right away if you develop shortness of breath, especially when lying down.   This information is not intended to replace advice given to you by your health care provider. Make sure you discuss any questions you have with your health care provider. Document Revised: 02/28/2021 Document Reviewed: 02/28/2021 Elsevier Patient Education  2023 Elsevier Inc.  

## 2022-10-05 NOTE — Telephone Encounter (Signed)
Called patient to inform her of provider recommendation. Patient will call insurance company to see if medication is covered

## 2022-10-05 NOTE — Progress Notes (Signed)
[facility] insurance premium discount evaluation:    Patient completed PCM office visit epic reviewed by RN Kimrey and transcribed. Labs  Tobacco attestation signed. [facility] ROI formed signed. Forms placed in the chart.   Patient given handouts for [facility] pharmacies and discount drugs list,MyChart, Tele doc setup, Tele doc Behavioral, [place] counseling and [facility] counseling.  What to do for infectious illness protocol. Given handout for list of medications that can be filled at [facility]. Given Clinic hours and Clinic Email.  

## 2022-10-05 NOTE — Telephone Encounter (Signed)
Medication needs Prior Authorization and needs to be sent to [email or web address] Fax [phone] Semaglutide (RYBELSUS) 14 MG TABS

## 2022-10-05 NOTE — Telephone Encounter (Signed)
Pharmacy Patient Advocate Encounter   Received notification that prior authorization for Rybelsus 14mg  is required/requested.  Per Test Claim: Prior authorization required   PA submitted on 10/05/22 to (ins) [employer] via [email or web address] Key # [id number]  Status is pending

## 2022-10-05 NOTE — Telephone Encounter (Signed)
Medication needs Prior Authorization and needs to be sent to [email or web address] Fax [phone] Semaglutide (RYBELSUS) 14 MG TABS

## 2022-10-05 NOTE — Telephone Encounter (Signed)
Didn't mean to route this telephone note to you
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
## 2022-10-05 NOTE — Telephone Encounter (Signed)
She should check on coverage for bydureon with her insurance

## 2022-10-05 NOTE — Telephone Encounter (Signed)
Patient Advocate Encounter  Prior Authorization for Rybelsus 14mg  has been approved.    PA#  EOC id: [id number]   Placed a call to [facility] to notify of the approval.

## 2022-10-07 NOTE — Telephone Encounter (Signed)
See office notes in Epic last appt 10/05/22 lasix Rx renewed for patient 20mg  po daily with 101meq potassium if weight gain 3 lbs in 24 hours or 5 lbs per week and to follow up with PCM as has discussed restarting 12.5mg  hydrochlorothiazide in addition to patient losartan.  Would then stop lasix use.

## 2022-10-09 NOTE — Telephone Encounter (Signed)
Spoke with patient and informed her that her PA has been approved

## 2022-10-10 ENCOUNTER — Ambulatory Visit: Payer: No Typology Code available for payment source | Admitting: Occupational Medicine

## 2022-10-10 VITALS — Wt 218.0 lb

## 2022-10-10 DIAGNOSIS — R609 Edema, unspecified: Secondary | ICD-10-CM

## 2022-10-11 NOTE — Telephone Encounter (Signed)
Discussed with patient PCM notes reviewed after clinic office visit with me last week.  She has been taking lasix 20mg  po daily and potassium chloride 3meq po daily and ankle/leg swelling has decreased.  Discussed to have weight check with RN Kimrey in clinic.  Denied headache, dizziness, dyspnea, dry mouth, palpitations.  Patient "new" car still at [employer] awaiting repairs and using loaner vehicle.  Mechanic behind on doing car repairs.  Patient stated no new Rx sent to her pharmacy for pick up from her PCM.  She stated she will notify me if she starts hydrochlorothiazide.  Labs planned for end of month recheck electrolytes and renal function nonfasting.  A&Ox3 spoke full sentences without difficulty skin warm dry and pink respirations even and unlabored RA.  Having some heartburn today using tums chewable po prn.  Gait sure and steady.  No edema noted ankles/legs/feet today.  Discussed would stop lasix if taking hydrochlorothiazide unless 3 lbs weight gain in 1 day or 5 lbs weight gain in 1 week prn dosing.  Weigh self daily in am recommended at home.  Patient verbalized understanding information/instructions, agreed with plan of care and had no further questions at this time.

## 2022-10-16 ENCOUNTER — Telehealth: Payer: Self-pay | Admitting: Occupational Medicine

## 2022-10-16 ENCOUNTER — Encounter: Payer: Self-pay | Admitting: Occupational Medicine

## 2022-10-16 DIAGNOSIS — J111 Influenza due to unidentified influenza virus with other respiratory manifestations: Secondary | ICD-10-CM

## 2022-10-16 MED ORDER — ONDANSETRON HCL 8 MG PO TABS: 8.0000 mg | ORAL_TABLET | Freq: Two times a day (BID) | ORAL | 0 refills | Status: AC | PRN

## 2022-10-16 MED ORDER — OSELTAMIVIR PHOSPHATE 75 MG PO CAPS: 75.0000 mg | ORAL_CAPSULE | Freq: Two times a day (BID) | ORAL | 0 refills | Status: AC

## 2022-10-16 NOTE — Telephone Encounter (Signed)
At 845 am Spoke to the patient reports NVD low grade fever and body aches since Yesterday. Unable to hold down water or Gatorade. Educated to sip only. NP notified Will be ordering medications. Once NVD improved can try BRAT diet. No juices or soda worsen diarrhea.

## 2022-10-16 NOTE — Telephone Encounter (Unsigned)
RN Bess Kinds notified NP patient with nausea/vomiting unable to tolerate po fluids requesting zofran refill be sent to her pharmacy.  Also having body aches, low grade fever.  Tamiflu 75mg  po BID x 5 days #10 RF0 and zofran  8mg  po BID prn n/v #10 RF0 sent to her pharmacy of choice.

## 2022-10-17 NOTE — Telephone Encounter (Signed)
Patient reports no fever, vomiting since yesterday am. Feeling better today. Pt able to RTW 4/10. Hr supervisor and NP made aware. Will check in on her tomorrow.

## 2022-10-18 ENCOUNTER — Encounter: Payer: Self-pay | Admitting: Registered Nurse

## 2022-10-18 ENCOUNTER — Telehealth: Payer: Self-pay | Admitting: Registered Nurse

## 2022-10-18 ENCOUNTER — Other Ambulatory Visit (HOSPITAL_COMMUNITY): Payer: Self-pay

## 2022-10-18 DIAGNOSIS — A084 Viral intestinal infection, unspecified: Secondary | ICD-10-CM

## 2022-10-18 NOTE — Telephone Encounter (Signed)
RTW today feeling better only occasional nausea better with medication. No diarrhea, fever or vomiting.

## 2022-10-18 NOTE — Telephone Encounter (Signed)
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  Denied fever in previous 24 hours or vomiting/diarrhea.  Denied needs or concerns at this time.  A&Ox3 spoke full sentences without difficulty no audible wheezing/cough/congestion during 2 minute call.

## 2022-10-18 NOTE — Telephone Encounter (Signed)
Per test claim, refills is too soon. No PA submitted at this time.

## 2022-10-23 NOTE — Telephone Encounter (Signed)
Patient RTW today doing well. Reports completely resolved.

## 2022-10-24 ENCOUNTER — Telehealth: Payer: Self-pay | Admitting: Registered Nurse

## 2022-10-24 ENCOUNTER — Encounter: Payer: Self-pay | Admitting: Registered Nurse

## 2022-10-24 DIAGNOSIS — R232 Flushing: Secondary | ICD-10-CM

## 2022-10-24 DIAGNOSIS — R110 Nausea: Secondary | ICD-10-CM
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
## 2022-10-25 ENCOUNTER — Ambulatory Visit (HOSPITAL_COMMUNITY)
Admission: RE | Admit: 2022-10-25 | Discharge: 2022-10-25 | Disposition: A | Discharge status: Home / Self Care | Payer: No Typology Code available for payment source | Source: Ambulatory Visit | Attending: Internal Medicine | Admitting: Internal Medicine

## 2022-10-25 DIAGNOSIS — R609 Edema, unspecified: Secondary | ICD-10-CM | POA: Insufficient documentation

## 2022-10-25 DIAGNOSIS — R0600 Dyspnea, unspecified: Secondary | ICD-10-CM | POA: Diagnosis not present

## 2022-10-25 DIAGNOSIS — E785 Hyperlipidemia, unspecified: Secondary | ICD-10-CM | POA: Insufficient documentation

## 2022-10-25 DIAGNOSIS — I10 Essential (primary) hypertension: Secondary | ICD-10-CM | POA: Insufficient documentation

## 2022-10-25 DIAGNOSIS — E119 Type 2 diabetes mellitus without complications: Secondary | ICD-10-CM | POA: Insufficient documentation

## 2022-10-25 DIAGNOSIS — R0609 Other forms of dyspnea: Secondary | ICD-10-CM

## 2022-10-25 DIAGNOSIS — K219 Gastro-esophageal reflux disease without esophagitis: Secondary | ICD-10-CM | POA: Diagnosis not present

## 2022-10-25 LAB — ECHOCARDIOGRAM COMPLETE
Area-P 1/2: 5.84 cm2
Calc EF: 64.5 %
S' Lateral: 2.4 cm
Single Plane A2C EF: 69.5 %
Single Plane A4C EF: 60.8 %

## 2022-10-25 NOTE — Progress Notes (Signed)
Echocardiogram 2D Echocardiogram has been performed.  Augustine Radar 10/25/2022, 8:54 AM

## 2022-10-26 ENCOUNTER — Telehealth: Payer: Self-pay

## 2022-10-26 NOTE — Telephone Encounter (Signed)
Pt has called and I was able to inform her of her Echo results. Pt has stated she would like to be put on lasix as it has helped keep her swelling down and she is now out and the swelling has returned.   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

## 2022-10-27 NOTE — Telephone Encounter (Signed)
Patient seen in workcenter stated glycerna shake missed with yogurt and fruit worked well for her did not have nausea dinner yesterday or breakfast today.  Wearing portable fan on neck, denied concerns, skin warm dry and pink gait sure and steady A&Ox3 respirations even and unlabored RA no cough/congestion/shortness of breath observed.  Spoke full sentences without difficulty.

## 2022-10-27 NOTE — Telephone Encounter (Signed)
Sorry, I would not be comfortable with this request, but will also pass on to PCP who might think it appropriate.  thanks

## 2022-11-01 NOTE — Telephone Encounter (Signed)
Patient called to check on the status of her receiving Lasix. She said she was starting to swell and would like to have something to help with the fluid buildup. She would like a call back at [phone].

## 2022-11-01 NOTE — Telephone Encounter (Signed)
She can use compression stockings. We do not recommend lasix long term for fluid as it can cause impact on kidneys long term. Reduce sodium intake to help as well.

## 2022-11-02 NOTE — Telephone Encounter (Signed)
Called pt no answer LMOM w/MD response../lmb 

## 2022-11-07 NOTE — Telephone Encounter (Signed)
Error

## 2022-11-09 ENCOUNTER — Other Ambulatory Visit: Payer: Self-pay | Admitting: Internal Medicine

## 2022-11-14 ENCOUNTER — Telehealth: Payer: Self-pay | Admitting: Internal Medicine

## 2022-11-14 NOTE — Telephone Encounter (Signed)
Called pt she states she did not follow-up because med was working and was told to f/u if med didn't work. She states she is ok w/MD increasing to 300 mg. [facility] [place].Marland KitchenRaechel Chute

## 2022-11-14 NOTE — Telephone Encounter (Signed)
She did not follow up for this but we did recommend to increase to 300 if not getting relief. Does she want to do this? If not needs virtual or visit per her preference.

## 2022-11-14 NOTE — Telephone Encounter (Signed)
Patient called and said buPROPion (WELLBUTRIN XL) 150 MG 24 hr tablet doesn't seem to be working for her. She would like to know if Dr. Okey Dupre would recommend anything else. The patient has an appointment scheduled for 11/24/22 to discuss the medication, but she does not want to come in if she doesn't have to. Best callback is [phone].

## 2022-11-15 MED ORDER — BUPROPION HCL ER (XL) 300 MG PO TB24: 300.0000 mg | ORAL_TABLET | Freq: Every day | ORAL | 0 refills | Status: DC

## 2022-11-15 NOTE — Telephone Encounter (Signed)
Called pt no answer LMOM w/MD response../lmb 

## 2022-11-15 NOTE — Telephone Encounter (Signed)
Send in 300 mg to try. Follow up 4-6 weeks.

## 2022-11-19 ENCOUNTER — Other Ambulatory Visit: Payer: Self-pay

## 2022-11-19 ENCOUNTER — Emergency Department (HOSPITAL_BASED_OUTPATIENT_CLINIC_OR_DEPARTMENT_OTHER)
Admission: EM | Admit: 2022-11-19 | Discharge: 2022-11-19 | Disposition: A | Discharge status: Home / Self Care | Payer: No Typology Code available for payment source | Attending: Emergency Medicine | Admitting: Emergency Medicine

## 2022-11-19 DIAGNOSIS — Z7982 Long term (current) use of aspirin: Secondary | ICD-10-CM | POA: Diagnosis not present

## 2022-11-19 DIAGNOSIS — E119 Type 2 diabetes mellitus without complications: Secondary | ICD-10-CM | POA: Diagnosis not present

## 2022-11-19 DIAGNOSIS — S01511A Laceration without foreign body of lip, initial encounter: Secondary | ICD-10-CM

## 2022-11-19 DIAGNOSIS — W540XXA Bitten by dog, initial encounter: Secondary | ICD-10-CM | POA: Insufficient documentation

## 2022-11-19 DIAGNOSIS — S01551A Open bite of lip, initial encounter: Secondary | ICD-10-CM | POA: Diagnosis present

## 2022-11-19 DIAGNOSIS — Z7984 Long term (current) use of oral hypoglycemic drugs: Secondary | ICD-10-CM | POA: Insufficient documentation

## 2022-11-19 DIAGNOSIS — Z23 Encounter for immunization: Secondary | ICD-10-CM | POA: Insufficient documentation

## 2022-11-19 MED ORDER — AMOXICILLIN-POT CLAVULANATE 875-125 MG PO TABS: 1.0000 | ORAL_TABLET | Freq: Two times a day (BID) | ORAL | 0 refills | Status: DC

## 2022-11-19 MED ORDER — AMOXICILLIN-POT CLAVULANATE 875-125 MG PO TABS
1.0000 | ORAL_TABLET | Freq: Once | ORAL | Status: AC
  Administered 2022-11-19: 1 via ORAL
  Filled 2022-11-19: qty 1

## 2022-11-19 MED ORDER — LIDOCAINE-EPINEPHRINE (PF) 2 %-1:200000 IJ SOLN
20.0000 mL | Freq: Once | INTRAMUSCULAR | Status: AC
  Administered 2022-11-19: 20 mL
  Filled 2022-11-19: qty 20

## 2022-11-19 MED ORDER — LIDOCAINE-EPINEPHRINE-TETRACAINE (LET) TOPICAL GEL
3.0000 mL | Freq: Once | TOPICAL | Status: DC
  Filled 2022-11-19: qty 3

## 2022-11-19 MED ORDER — TETANUS-DIPHTH-ACELL PERTUSSIS 5-2.5-18.5 LF-MCG/0.5 IM SUSY
0.5000 mL | PREFILLED_SYRINGE | Freq: Once | INTRAMUSCULAR | Status: AC
  Administered 2022-11-19: 0.5 mL via INTRAMUSCULAR
  Filled 2022-11-19: qty 0.5

## 2022-11-19 MED ORDER — HYDROCODONE-ACETAMINOPHEN 5-325 MG PO TABS
1.0000 | ORAL_TABLET | Freq: Once | ORAL | Status: AC
  Administered 2022-11-19: 1 via ORAL
  Filled 2022-11-19: qty 1
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
## 2022-11-19 NOTE — Discharge Instructions (Signed)
Sutured repair Keep the laceration site dry for the next 24 hours and leave the dressing in place. After 24 hours you may remove the dressing and gently clean the laceration site with antibacterial soap and warm water. Do not scrub the area. Do not soak the area and water for long periods of time. Don't use hydrogen peroxide, iodine-based solutions, or alcohol, which can slow healing, and will probably be painful! Apply topical bacitracin 1-2 times per day for the next 3-5 days. Please see PCP/UC or ED in 7 - 5 days for removal of the sutures.  You should return sooner for any signs of infection which would include increased redness around the wound, increased swelling, new drainage of yellow pus.   Adhesive tape repair Tape strips have the advantage of being less painful to apply and lower risk of infection, but do require more caution on your part, as they are more fragile than other skin closure techniques.  It's important to: -Keep the tape clean and dry -Avoid picking at the tape or rubbing the area -Avoid soaking in water (showering is okay-bathing is not) -The tape strips will fall off on their own in about 5-7 days (if they don't, you can gently remove them or soak the wound in water at this time to loosen them).  At this time, scar tissue will be forming under the surface of the wound and your body will do the rest of the work of healing.   Also for dog bite, sent Augmentin to your pharmacy.  Please take the entire course.
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
## 2022-11-23 ENCOUNTER — Ambulatory Visit: Payer: No Typology Code available for payment source | Admitting: Registered Nurse

## 2022-11-23 ENCOUNTER — Encounter: Payer: Self-pay | Admitting: Registered Nurse

## 2022-11-23 VITALS — BP 129/100 | HR 104 | Temp 99.7°F

## 2022-11-23 DIAGNOSIS — I10 Essential (primary) hypertension: Secondary | ICD-10-CM

## 2022-11-23 DIAGNOSIS — W540XXD Bitten by dog, subsequent encounter: Secondary | ICD-10-CM

## 2022-11-23 NOTE — Patient Instructions (Signed)
Sutured Wound Care Sutures are stitches that can be used to close wounds. Sutures come in different materials. They may break down as your wound heals (absorbable), or they may need to be removed (nonabsorbable). Taking care of your wound properly can help to prevent pain and infection. It can also help your wound heal more quickly. Follow instructions from your health care provider about how to care for your sutured wound. Supplies needed: Soap and water. A clean, dry towel. Wound cleanser or saline, if needed. A clean gauze or bandage (dressing), if needed. Antibiotic ointment, if told by your health care provider. How to care for your sutured wound  Keep the wound completely dry for the first 24 hours, or for as long as told by your health care provider. After 24-48 hours, you may shower or bathe as told by your health care provider. Do not soak or submerge the wound in water until the sutures have been removed. After the first 24 hours, clean the wound once a day, or as often as told by your health care provider. Use the following steps: Wash and rinse the wound as told by your health care provider. Pat the wound dry with a clean towel. Do not rub the wound. After cleaning the wound, apply a thin layer of antibiotic ointment as told by your health care provider. This will prevent infection and keep the dressing from sticking to the wound. Follow instructions from your health care provider about how to change your dressing. Make sure you: Wash your hands with soap and water for at least 20 seconds before and after you change your dressing. If soap and water are not available, use hand sanitizer. Change your dressing at least once a day, or as often as told by your health care provider. If your dressing gets wet or dirty, change it. Leave sutures and other skin closures, such as adhesive strips or skin glue, in place. These skin closures may need to stay in place for 2 weeks or longer. If  adhesive strip edges start to loosen and curl up, you may trim the loose edges. Do not remove adhesive strips completely unless your health care provider tells you to do that. Check your wound every day for signs of infection. Watch for: Redness, swelling, or pain. Fluid or blood. New warmth, a rash, or hardness at the wound site. Pus or a bad smell. Have the sutures removed as told by your health care provider. Follow these instructions at home: Medicines Take or apply over-the-counter and prescription medicines only as told by your health care provider. If you were prescribed an antibiotic medicine or ointment, take or apply it as told by your health care provider. Do not stop using the antibiotic even if your condition improves. General instructions To help reduce scarring after your wound heals, cover your wound with clothing or apply sunscreen of at least 30 SPF whenever you are outside. Do not scratch or pick at your wound. Avoid stretching your wound. Raise (elevate) the injured area above the level of your heart while you are sitting or lying down, if possible. Eat a diet that includes protein, vitamin A, and vitamin C to help the wound heal. Drink enough fluid to keep your urine pale yellow. Keep all follow-up visits. This is important. Contact a health care provider if: You received a tetanus shot and you have swelling, severe pain, redness, or bleeding at the injection site. Your wound breaks open or you notice something coming out if  it, such as wood or glass. You have any of these signs of infection: Redness, swelling, or pain around your wound. Fluid or blood coming from your wound. New warmth, a rash, or hardness around the wound. A fever. The skin near your wound changes color. You have pain that does not get better with medicine. You develop numbness around the wound. Get help right away if: You develop severe swelling or more pain around your wound. You have pus or a  bad smell coming from your wound. You develop painful lumps near your wound or anywhere on your body. You have a red streak spreading out from your wound. The wound is on your hand or foot and: Your fingers or toes look pale or bluish. You cannot properly move a finger or toe. You have numbness that is spreading down your hand, foot, fingers, or toes. Summary Sutures are stitches that can be used to close wounds. Taking care of your wound properly can help to prevent pain and infection. Keep the wound completely dry for the first 24 hours, or for as long as told by your health care provider. After 24-48 hours, you may shower or bathe as told by your health care provider. To help with healing, eat foods that are rich in protein, vitamin A, and vitamin C. This information is not intended to replace advice given to you by your health care provider. Make sure you discuss any questions you have with your health care provider. Document Revised: 11/01/2020 Document Reviewed: 11/01/2020 Elsevier Patient Education  2023 [employer] Lobbyist, Adult Animal bites range from mild to serious. An animal bite can result in any of these injuries: A scratch. A deep, open cut. Broken (punctured) or torn skin. A crush injury. A bone injury. A small bite from a house pet is usually less serious than a bite from a stray or wild animal. Cat bites can be more serious because their long, thin teeth can cause deep puncture wounds that close fast, trapping bacteria inside. Stray or wild animals, such as a raccoon, fox, skunk, or bat, are at higher risk of carrying a serious infection called rabies, which they can pass to a human through a bite. A bite from one of these animals needs medical care right away and, sometimes, rabies vaccination. What increases the risk? You are more likely to be bitten by an animal if: You are around unfamiliar pets. You disturb an animal when it is eating, sleeping, or caring for  its babies. You are outdoors in a place where small, wild animals roam freely. What are the signs or symptoms? Common symptoms of an animal bite include: Pain. Bleeding. Swelling. Bruising. How is this diagnosed? This condition may be diagnosed based on a physical exam and medical history. Your health care provider will examine your wound and ask for details about the animal and how the bite happened. You may also have tests, such as: Blood tests to check for infection. X-rays to check for damage to bones or joints. Taking a fluid sample from your wound and checking it for infection (culture test). How is this treated? Treatment depends on the type of animal, where the bite is on your body, and your medical history. Treatment may include: Wound care. This often includes cleaning the wound and rinsing it out (flushing it) with saline solution, which is made of salt and water. A bandage (dressing) is also often applied. In rare cases, the wound may be closed with stitches (sutures), staples,  skin glue, or adhesive strips. Antibiotic medicine to prevent or treat infection. This medicine may be prescribed in pill or ointment form. If the bite area gets infected, the medicine may be given through an IV. A tetanus shot to prevent tetanus infection. Rabies treatment to prevent rabies infection, if the animal could have rabies. Surgery. This may be done if a bite gets infected or causes damage that needs to be repaired. Follow these instructions at home: Medicines Take or apply over-the-counter and prescription medicines only as told by your health care provider. If you were prescribed an antibiotic medicine, take or apply it as told by your health care provider. Do not stop using the antibiotic even if you start to feel better. Wound care  Follow instructions from your health care provider about how to take care of your wound. Make sure you: Wash your hands with soap and water for at least 20  seconds before and after you change your dressing. If soap and water are not available, use hand sanitizer. Change your dressing as told by your health care provider. Leave sutures, skin glue, or adhesive strips in place. These skin closures may need to stay in place for 2 weeks or longer. If adhesive strip edges start to loosen and curl up, you may trim the loose edges. Do not remove adhesive strips completely unless your health care provider tells you to do that. Check your wound every day for signs of infection. Check for: More redness, swelling, or pain. More fluid or blood. Warmth. Pus or a bad smell. General instructions  Raise (elevate) the injured area above the level of your heart while you are sitting or lying down, if this is possible. If directed, put ice on the injured area. To do this: Put ice in a plastic bag. Place a towel between your skin and the bag. Leave the ice on for 20 minutes, 2-3 times per day. Remove the ice if your skin turns bright red. This is very important. If you cannot feel pain, heat, or cold, you have a greater risk of damage to the area. Keep all follow-up visits. This is important. Contact a health care provider if: You have more redness, swelling, or pain around your wound. Your wound feels warm to the touch. You have a fever or chills. You have a general feeling of sickness (malaise). You feel nauseous or you vomit. You have pain that does not get better. Get help right away if: You have a red streak that leads away from your wound. You have non-clear fluid or more blood coming from your wound. There is pus or a bad smell coming from your wound. You have trouble moving your injured area. You have numbness or tingling that spreads beyond your wound. Summary Animal bites can range from mild to serious. An animal bite can cause a scratch on the skin, a deep and open cut, torn or punctured skin, a crush injury, or a bone injury. A bite from a stray  or wild animal needs medical care right away and, sometimes, rabies vaccination. Your health care provider will examine your wound and ask for details about the animal and how the bite happened. Treatment may include wound care, antibiotic medicine, a tetanus shot, and rabies treatment if the animal could have rabies. This information is not intended to replace advice given to you by your health care provider. Make sure you discuss any questions you have with your health care provider. Document Revised: 07/01/2021 Document Reviewed: 07/01/2021  Elsevier Patient Education  2023 [employer]  

## 2022-11-23 NOTE — Progress Notes (Signed)
Subjective:    Patient ID: Kelly Reed, female    DOB: Sep 22, 1965, 57 y.o.   MRN: [id number]  56y/o married established caucasian female  here for evaluation lip sutures placed ER was instructed to have removed on Monday 11/27/22.  Has been washing with soap and water daily and applying vaseline prn.  Denied fever/chills/discharge.  Still taking her antibiotic  Denied headache, chest pain, palpitations or visual changes.      Review of Systems  Constitutional:  Negative for chills and fever.  HENT:  Negative for trouble swallowing and voice change.   Eyes:  Negative for photophobia and visual disturbance.  Respiratory:  Negative for cough, shortness of breath, wheezing and stridor.   Cardiovascular:  Negative for chest pain and palpitations.  Gastrointestinal:  Negative for diarrhea, nausea and vomiting.  Genitourinary:  Negative for difficulty urinating.  Musculoskeletal:  Negative for neck pain and neck stiffness.  Skin:  Positive for wound.  Allergic/Immunologic: Positive for environmental allergies.  Neurological:  Negative for dizziness, tremors, seizures, syncope, facial asymmetry, speech difficulty, weakness, light-headedness, numbness and headaches.  Hematological:  Negative for adenopathy.  Psychiatric/Behavioral:  Negative for agitation, confusion and sleep disturbance.        Objective:   Physical Exam Vitals and nursing note reviewed.  Constitutional:      General: She is awake. She is not in acute distress.    Appearance: Normal appearance. She is well-developed, well-groomed and overweight. She is not ill-appearing, toxic-appearing or diaphoretic.  HENT:     Head: Normocephalic. Contusion and laceration present. No raccoon eyes, Battle's sign, right periorbital erythema or left periorbital erythema.     Jaw: There is normal jaw occlusion.     Salivary Glands: Right salivary gland is not diffusely enlarged. Left salivary gland is not diffusely enlarged.       Comments: Lip puncture wounds dog bite healing and sutures intact x 5    Right Ear: Hearing and external ear normal.     Left Ear: Hearing and external ear normal.     Nose: Nose normal. No congestion or rhinorrhea.     Mouth/Throat:     Lips: Pink. No lesions.     Mouth: Mucous membranes are moist.     Pharynx: Oropharynx is clear.  Eyes:     General: Lids are normal. Vision grossly intact. Gaze aligned appropriately. No scleral icterus.       Right eye: No discharge.        Left eye: No discharge.     Extraocular Movements: Extraocular movements intact.     Conjunctiva/sclera: Conjunctivae normal.     Pupils: Pupils are equal, round, and reactive to light.  Neck:     Trachea: Trachea and phonation normal.  Cardiovascular:     Rate and Rhythm: Regular rhythm. Tachycardia present.     Pulses: Normal pulses.          Radial pulses are 2+ on the right side and 2+ on the left side.  Pulmonary:     Effort: Pulmonary effort is normal. No respiratory distress.     Breath sounds: Normal breath sounds and air entry. No stridor or transmitted upper airway sounds. No decreased breath sounds, wheezing, rhonchi or rales.     Comments: Spoke full sentences without difficulty; no cough observed in exam room Abdominal:     Palpations: Abdomen is soft.  Musculoskeletal:        General: Normal range of motion.     Cervical back:  Normal range of motion and neck supple. No swelling, edema, deformity, erythema, signs of trauma, lacerations, rigidity, spasms, torticollis, tenderness, bony tenderness or crepitus. No pain with movement or muscular tenderness. Normal range of motion.     Thoracic back: No swelling, edema, deformity, signs of trauma or lacerations. Normal range of motion.  Lymphadenopathy:     Head:     Right side of head: No submandibular or preauricular adenopathy.     Left side of head: No submandibular or preauricular adenopathy.     Cervical: No cervical adenopathy.     Right  cervical: No superficial cervical adenopathy.    Left cervical: No superficial cervical adenopathy.  Skin:    General: Skin is warm and dry.     Capillary Refill: Capillary refill takes less than 2 seconds.     Coloration: Skin is not ashen, cyanotic, jaundiced, mottled, pale or sallow.     Findings: Ecchymosis and wound present. No abrasion, abscess, acne, bruising, burn, erythema, signs of injury, laceration, petechiae or rash. Rash is not crusting, macular, nodular, purpuric, pustular, scaling, urticarial or vesicular.     Nails: There is no clubbing.     Comments: Left upper lip 2 intact sutures and central lower lip with 3 intact sutures no discharge well approximated clean; faint ecchymosis left upper vermilion lateral border  Neurological:     General: No focal deficit present.     Mental Status: She is alert and oriented to person, place, and time. Mental status is at baseline.     GCS: GCS eye subscore is 4. GCS verbal subscore is 5. GCS motor subscore is 6.     Cranial Nerves: No cranial nerve deficit, dysarthria or facial asymmetry.     Motor: Motor function is intact. No weakness, tremor, atrophy, abnormal muscle tone or seizure activity.     Coordination: Coordination is intact. Coordination normal.     Gait: Gait is intact. Gait normal.     Comments: In/out of chair without difficulty; gait sure and steady in clinic; bilateral hand grasp equal 5/5  Psychiatric:        Attention and Perception: Attention and perception normal.        Mood and Affect: Mood and affect normal.        Speech: Speech normal.        Behavior: Behavior normal. Behavior is cooperative.        Thought Content: Thought content normal.        Cognition and Memory: Cognition and memory normal.        Judgment: Judgment normal.           Assessment & Plan:  A-dog bite subsequent encounter, elevated blood pressure  P-continue antibiotic augmentin 875mg  po BID until all pills finished.  Wash with  soap and water daily.  May continue to apply vaseline/aquaphor topical prn.  Follow up Monday with RN Bess Kinds for suture removal.  Notify clinic staff if signs of infection e.g. worsening swelling/pain, discharge or fever/chills.  Exitcare handout on animal bite.  Patient received tetanus booster in ER with sutures.  Patient agreed with plan of care and had no further questions at this time.  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  Discussed hydrate and avoid caffeine intake  this afternoon.  Take all her prescribed medications as written.  ER if chest pain, worst headache of life, dyspnea or visual changes for re-evaluation.  Will see RN Kimrey for repeat BP check.   Patient  verbalized understanding information/instructions, agreed with plan of care and had no further questions at this time.

## 2022-11-24 ENCOUNTER — Ambulatory Visit: Payer: No Typology Code available for payment source | Admitting: Internal Medicine

## 2022-11-27 ENCOUNTER — Ambulatory Visit: Payer: No Typology Code available for payment source | Admitting: Occupational Medicine

## 2022-11-27 ENCOUNTER — Ambulatory Visit: Payer: No Typology Code available for payment source | Admitting: Internal Medicine

## 2022-11-27 DIAGNOSIS — Z4802 Encounter for removal of sutures: Secondary | ICD-10-CM

## 2022-11-27 NOTE — Progress Notes (Signed)
Suture removed from top lip 2 and 3 from the bottom lip tolerated well. Will see NP tomorrow to look at the lip.

## 2022-11-28 ENCOUNTER — Encounter: Payer: Self-pay | Admitting: Registered Nurse

## 2022-11-28 ENCOUNTER — Ambulatory Visit: Payer: No Typology Code available for payment source | Admitting: Registered Nurse

## 2022-11-28 DIAGNOSIS — W540XXD Bitten by dog, subsequent encounter: Secondary | ICD-10-CM

## 2022-11-28 NOTE — Patient Instructions (Signed)
Suture Removal, Care After The following information offers guidance on how to care for yourself after your procedure. Your health care provider may also give you more specific instructions. If you have problems or questions, contact your health care provider. What can I expect after the procedure? After your stitches (sutures) are removed, it is common to have: Some discomfort and swelling in the area. Slight redness in the area. Follow these instructions at home: If you have a dressing: Wash your hands with soap and water for at least 20 seconds before and after you change your bandage (dressing). If soap and water are not available, use hand sanitizer. Change your dressing as told by your health care provider. If your dressing becomes wet or dirty, or develops a bad smell, change it as soon as possible. If your dressing sticks to your skin, pour warm, clean water over it until it loosens and can be removed without pulling apart the wound edges. Pat the area dry with a soft, clean towel. Do not rub the wound because that may cause bleeding. Wound care  Check your wound every day for signs of infection. Check for: More redness, swelling, or pain. Fluid or blood. New warmth, a rash, or hardness at the wound site. Pus or a bad smell. Wash your hands with soap and water for at least 20 seconds before and after touching your wound. If soap and water are not available, use hand sanitizer. Keep the wound area dry and clean. Clean and pat the wound dry as told by your health care provider. Apply cream or ointment only as told by your health care provider. If skin glue or adhesive strips were applied after sutures were removed, leave these closures in place. They may need to stay in place for 2 weeks or longer. If adhesive strip edges start to loosen and curl up, you may trim the loose edges. Do not remove adhesive strips completely unless your health care provider tells you to do that. Continue to  protect the wound from injury. Do not pick at your wound. Picking can cause an infection. Bathing Do not take baths, swim, or use a hot tub until your health care provider approves. Ask your health care provider if you may take showers. Follow these steps for showering: If you have a dressing, remove it before getting into the shower. In the shower, allow soapy water to get on the wound. Avoid scrubbing the wound. When you get out of the shower, dry the wound by patting it with a clean towel. Reapply a dressing over the wound, if needed. Scar care When your wound has completely healed, help decrease the size of your scar by: Wearing sunscreen over the scar or covering it with clothing when you are outside. New scars get sunburned easily, which can make scarring worse. Gently massaging the scarred area. This can decrease scar thickness. General instructions Take over-the-counter and prescription medicines only as told by your health care provider. Keep all follow-up visits. This is important. Contact a health care provider if: You have more redness, swelling, or pain around your wound. You have fluid or blood coming from your wound. You have new warmth, a rash, or hardness at the wound site. You have pus or a bad smell coming from your wound. Your wound opens up. Get help right away if: You have a fever or chills. You have red streaks coming from your wound. Summary After your sutures are removed, it is common to have some discomfort   and swelling in the area. Wash your hands with soap and water before you change your bandage (dressing). Keep the wound area dry and clean. Do not take baths, swim, or use a hot tub until your health care provider approves. This information is not intended to replace advice given to you by your health care provider. Make sure you discuss any questions you have with your health care provider. Document Revised: 10/19/2020 Document Reviewed:  10/19/2020 Elsevier Patient Education  2023 [employer]  

## 2022-11-28 NOTE — Progress Notes (Signed)
Subjective:    Patient ID: Kelly Reed, female    DOB: [birth year], 57 y.o.   MRN: [id number]  56y/o married caucasian female established patient had 5 sutures removed from lips yesterday by RN Bess Kinds that were placed by ER 11/19/22 after dog bite.  Finished antiibiotics.  Here today concern retained suture material upper lip saw on cosmetic enlarging mirror last night after work/clinic appt.  Applied vaseline this am lips  Denied n/v/d/f/c/dysphagia/dysphasia      Review of Systems  Constitutional:  Negative for chills and fever.  HENT:  Negative for facial swelling, mouth sores, trouble swallowing and voice change.   Eyes:  Negative for photophobia and visual disturbance.  Respiratory:  Negative for cough and wheezing.   Genitourinary:  Negative for difficulty urinating.  Musculoskeletal:  Negative for gait problem, neck pain and neck stiffness.  Skin:  Positive for wound. Negative for color change, pallor and rash.  Allergic/Immunologic: Positive for environmental allergies.  Neurological:  Negative for tremors, weakness and headaches.  Hematological:  Negative for adenopathy.  Psychiatric/Behavioral:  Negative for agitation, confusion and sleep disturbance.        Objective:   Physical Exam Vitals and nursing note reviewed.  Constitutional:      General: She is awake. She is not in acute distress.    Appearance: Normal appearance. She is well-developed and well-groomed. She is not ill-appearing, toxic-appearing or diaphoretic.  HENT:     Head: Normocephalic and atraumatic.     Jaw: There is normal jaw occlusion.     Salivary Glands: Right salivary gland is not diffusely enlarged. Left salivary gland is not diffusely enlarged.     Right Ear: Hearing and external ear normal.     Left Ear: Hearing and external ear normal.     Nose: Nose normal. No congestion or rhinorrhea.     Mouth/Throat:     Lips: Pink. No lesions.     Mouth: Mucous membranes are moist.     Pharynx:  Oropharynx is clear.  Eyes:     General: Lids are normal. Vision grossly intact. Gaze aligned appropriately. No scleral icterus.       Right eye: No discharge.        Left eye: No discharge.     Extraocular Movements: Extraocular movements intact.     Conjunctiva/sclera: Conjunctivae normal.     Pupils: Pupils are equal, round, and reactive to light.  Neck:     Trachea: Trachea normal.  Cardiovascular:     Rate and Rhythm: Normal rate and regular rhythm.  Pulmonary:     Effort: Pulmonary effort is normal.     Breath sounds: Normal breath sounds and air entry. No stridor or transmitted upper airway sounds. No wheezing.     Comments: Spoke full sentences without difficulty; no cough observed in exam room Abdominal:     General: Abdomen is flat.  Musculoskeletal:        General: Normal range of motion.     Cervical back: Normal range of motion and neck supple. No rigidity.     Right lower leg: No edema.     Left lower leg: No edema.  Lymphadenopathy:     Head:     Right side of head: No submandibular or preauricular adenopathy.     Left side of head: No submandibular or preauricular adenopathy.     Cervical: No cervical adenopathy.     Right cervical: No superficial cervical adenopathy.    Left cervical: No  superficial cervical adenopathy.  Skin:    General: Skin is warm and dry.     Capillary Refill: Capillary refill takes less than 2 seconds.     Coloration: Skin is not ashen, cyanotic, jaundiced, mottled, pale or sallow.     Findings: Rash present. No abrasion, abscess, acne, bruising, burn, ecchymosis, erythema, signs of injury, laceration, lesion, petechiae or wound. Rash is scaling. Rash is not crusting, nodular, purpuric, pustular, urticarial or vesicular.     Nails: There is no clubbing.     Comments: Brown scab 2-20mm upper lip x 2 dry no discharge/bleeding; woods lamp used to inspect lips no suture material visualized and patient unable to feel material with palpation of  area she saw it last night  Neurological:     General: No focal deficit present.     Mental Status: She is alert and oriented to person, place, and time. Mental status is at baseline.     GCS: GCS eye subscore is 4. GCS verbal subscore is 5. GCS motor subscore is 6.     Cranial Nerves: Cranial nerves 2-12 are intact. No cranial nerve deficit, dysarthria or facial asymmetry.     Sensory: Sensation is intact.     Motor: Motor function is intact. No weakness, tremor, atrophy, abnormal muscle tone or seizure activity.     Coordination: Coordination is intact. Coordination normal.     Gait: Gait is intact. Gait normal.     Comments: In/out of chair without difficulty; gait sure and steady in clinic; bilateral hand grasp equal 5/5  Psychiatric:        Attention and Perception: Attention and perception normal.        Mood and Affect: Mood and affect normal.        Speech: Speech normal.        Behavior: Behavior normal. Behavior is cooperative.        Thought Content: Thought content normal.        Cognition and Memory: Cognition and memory normal.        Judgment: Judgment normal.           Assessment & Plan:   A-dog bite subsequent visit  P-unable to locate any retained suture material upper lip on woods lamp evaluation today.  No signs of infection healing well sutures x 5 removed yesterday by RN Bess Kinds continue washing with soap and water daily and may continue application vaseline daily/prn until fully healed.  Return to clinic if purulent drainage, swelling/pain worsening for re-evaluation.  Exitcare handout on suture removal aftercare.  Patient verbalized understanding information/instructions and had no further questions at this time.

## 2022-12-05 ENCOUNTER — Encounter: Payer: Self-pay | Admitting: Registered Nurse

## 2022-12-05 ENCOUNTER — Telehealth: Payer: Self-pay | Admitting: Registered Nurse

## 2022-12-05 DIAGNOSIS — T8189XA Other complications of procedures, not elsewhere classified, initial encounter: Secondary | ICD-10-CM

## 2022-12-05 NOTE — Telephone Encounter (Signed)
Upper lip medial retained suture material noted again this weekend and today by patient.  Requested appt scheduled 12/07/22 at 1100 refused appt this morning unable to leave workcenter.  Single strand noted upper central lip when skin pulled tight no signs of infection e.g. swelling/discharge/erythema  Patient A&Ox3 spoke full sentences without difficulty respirations even and unlabored RA skin warm dry and pink laceration full healed upper and lower lips without scabs/discharge/bleeding.  Mild edema noted lower lip and patient reported some tenderness.

## 2022-12-07 ENCOUNTER — Ambulatory Visit: Payer: No Typology Code available for payment source | Admitting: Registered Nurse

## 2022-12-07 ENCOUNTER — Encounter: Payer: Self-pay | Admitting: Registered Nurse

## 2022-12-07 VITALS — BP 127/81 | HR 98

## 2022-12-07 DIAGNOSIS — Z189 Retained foreign body fragments, unspecified material: Secondary | ICD-10-CM

## 2022-12-07 NOTE — Progress Notes (Signed)
   Subjective:    Patient ID: Kelly Reed, female    DOB: 03-24-1966, 57 y.o.   MRN: [id number]  56y/o married caucasian female established patient with retained suture material upper lip here for removal today  Denied fever/chills/discharge/infection      Review of Systems  Constitutional:  Negative for chills and fever.  HENT:  Negative for trouble swallowing and voice change.   Respiratory:  Negative for stridor.   Musculoskeletal:  Negative for neck pain and neck stiffness.  Skin:  Negative for color change and rash.  Neurological:  Negative for speech difficulty.  Psychiatric/Behavioral:  Negative for agitation, confusion and sleep disturbance.        Objective:   Physical Exam Nursing note reviewed.  Constitutional:      General: She is not in acute distress.    Appearance: Normal appearance. She is not ill-appearing, toxic-appearing or diaphoretic.  HENT:     Head: Normocephalic and atraumatic.     Right Ear: External ear normal.     Left Ear: External ear normal.     Nose: Nose normal.     Mouth/Throat:     Mouth: Mucous membranes are moist.  Eyes:     Extraocular Movements: Extraocular movements intact.     Conjunctiva/sclera: Conjunctivae normal.     Pupils: Pupils are equal, round, and reactive to light.  Pulmonary:     Effort: Pulmonary effort is normal.     Breath sounds: Normal breath sounds and air entry.  Musculoskeletal:     Cervical back: Normal range of motion and neck supple. No rigidity.  Lymphadenopathy:     Cervical: No cervical adenopathy.  Skin:    General: Skin is warm and dry.     Capillary Refill: Capillary refill takes less than 2 seconds.     Comments: Upper lip centrally single strand suture material observed and removed with splint out  Neurological:     General: No focal deficit present.     Mental Status: She is alert and oriented to person, place, and time. Mental status is at baseline.  Psychiatric:        Mood and Affect: Mood  normal.        Behavior: Behavior normal.        Thought Content: Thought content normal.        Judgment: Judgment normal.           Assessment & Plan:   A-retained suture subsequent encounter  P-single strand removed with splint out triple antibiotic applied afterwards topical lip  Continue triple antibiotic application after gentle wash with soap and water daily and prn dry lips until healed.  Avoid licking or biting lips as will delay healing. Follow up re-evaluation if discharge, worsening pain/swelling. Patient agreed with plan of care and had no further questions at this time.

## 2022-12-07 NOTE — Progress Notes (Signed)
Removed left over stitch string in upper lip. Tolerated well applied neosporin to lip after well.

## 2022-12-12 ENCOUNTER — Ambulatory Visit: Payer: No Typology Code available for payment source | Admitting: Registered Nurse

## 2022-12-12 ENCOUNTER — Encounter: Payer: Self-pay | Admitting: Registered Nurse

## 2022-12-12 ENCOUNTER — Telehealth: Payer: Self-pay | Admitting: Occupational Medicine

## 2022-12-12 VITALS — BP 157/98 | HR 106 | Temp 98.5°F | Resp 18

## 2022-12-12 DIAGNOSIS — J069 Acute upper respiratory infection, unspecified: Secondary | ICD-10-CM

## 2022-12-12 DIAGNOSIS — J111 Influenza due to unidentified influenza virus with other respiratory manifestations: Secondary | ICD-10-CM

## 2022-12-12 MED ORDER — OSELTAMIVIR PHOSPHATE 75 MG PO CAPS: 75.0000 mg | ORAL_CAPSULE | Freq: Two times a day (BID) | ORAL | 0 refills | Status: AC

## 2022-12-12 NOTE — Telephone Encounter (Signed)
Patient reports fever of 100 taking tylenol now. Patient wanted to know if need to call out in am couldn't remember. Explain to her that I let HR and supervisor know approved absence as of 12/11/22 tentative RTW 6/6. Will call her tomorrow afternoon.

## 2022-12-12 NOTE — Progress Notes (Addendum)
Subjective:    Patient ID: Kelly Reed, female    DOB: 09-05-1965, 57 y.o.   MRN: [id number]  56y/o married caucasian female established patient woke up with fever Sunday low grade, sore throat, sinus pressure, post nasal drip. Fever has resolved. Taking her usual allergy medications denied sick contacts known.  Fatigued at work today here for evaluation not feeling well/lung check/covid check.  Denied n/v/d/hemoptysis/scalp/teeth pain/ear discharge.  Has albuterol inhaler for prn use at home.  Tolerating po intake without difficulty today.     Review of Systems  Constitutional:  Positive for fatigue and fever. Negative for activity change, appetite change, chills and diaphoresis.  HENT:  Positive for congestion, postnasal drip, sinus pressure, sinus pain and sore throat. Negative for ear discharge, ear pain, facial swelling, hearing loss, mouth sores, nosebleeds, trouble swallowing and voice change.   Eyes:  Negative for photophobia, discharge, redness and visual disturbance.  Respiratory:  Positive for cough.   Cardiovascular:  Negative for chest pain.  Gastrointestinal:  Negative for diarrhea, nausea and vomiting.  Genitourinary:  Negative for difficulty urinating.  Musculoskeletal:  Negative for back pain, gait problem, neck pain and neck stiffness.  Skin:  Negative for color change, pallor, rash and wound.  Allergic/Immunologic: Positive for environmental allergies.  Neurological:  Negative for dizziness, tremors, seizures, syncope, facial asymmetry, speech difficulty, weakness and light-headedness.  Hematological:  Negative for adenopathy.  Psychiatric/Behavioral:  Negative for agitation, confusion and sleep disturbance.        Objective:   Physical Exam Vitals and nursing note reviewed.  Constitutional:      General: She is awake. She is not in acute distress.    Appearance: Normal appearance. She is well-developed, well-groomed and overweight. She is ill-appearing. She is  not toxic-appearing or diaphoretic.  HENT:     Head: Normocephalic and atraumatic.     Jaw: There is normal jaw occlusion. No trismus.     Salivary Glands: Right salivary gland is not diffusely enlarged or tender. Left salivary gland is not diffusely enlarged or tender.     Right Ear: Hearing, ear canal and external ear normal. No decreased hearing noted. No laceration, drainage, swelling or tenderness. A middle ear effusion is present. There is no impacted cerumen. No foreign body. No mastoid tenderness. No PE tube. No hemotympanum. Tympanic membrane is not injected, scarred, perforated, erythematous or retracted.     Left Ear: Hearing, ear canal and external ear normal. No decreased hearing noted. No laceration, drainage, swelling or tenderness. A middle ear effusion is present. There is no impacted cerumen. No foreign body. No mastoid tenderness. No PE tube. No hemotympanum. Tympanic membrane is not injected, scarred, perforated, erythematous or retracted.     Nose: Mucosal edema, congestion and rhinorrhea present. No nasal deformity, septal deviation or laceration. Rhinorrhea is clear.     Right Sinus: Maxillary sinus tenderness present. No frontal sinus tenderness.     Left Sinus: Maxillary sinus tenderness present. No frontal sinus tenderness.     Mouth/Throat:     Lips: Pink. No lesions.     Mouth: Mucous membranes are moist. Mucous membranes are not pale, not dry and not cyanotic. No lacerations, oral lesions or angioedema.     Dentition: No dental abscesses or gum lesions.     Tongue: No lesions.     Palate: No mass and lesions.     Pharynx: Uvula midline. Pharyngeal swelling and posterior oropharyngeal erythema present. No oropharyngeal exudate or uvula swelling.  Tonsils: No tonsillar exudate or tonsillar abscesses.     Comments: Cobblestoning posterior pharynx; bilateral TMs intact air fluid level clear; bilateral allergic shiners; nasal turbinates edema/erythema clear discharge;  lower eyelids nonpitting edema Eyes:     General: Lids are normal. Vision grossly intact. Gaze aligned appropriately. Allergic shiner present. No scleral icterus.       Right eye: No foreign body, discharge or hordeolum.        Left eye: No foreign body, discharge or hordeolum.     Extraocular Movements: Extraocular movements intact.     Right eye: Normal extraocular motion and no nystagmus.     Left eye: Normal extraocular motion and no nystagmus.     Conjunctiva/sclera: Conjunctivae normal.     Right eye: Right conjunctiva is not injected. No chemosis, exudate or hemorrhage.    Left eye: Left conjunctiva is not injected. No chemosis, exudate or hemorrhage.    Pupils: Pupils are equal, round, and reactive to light. Pupils are equal.     Right eye: Pupil is round and reactive.     Left eye: Pupil is round and reactive.  Neck:     Thyroid: No thyroid mass or thyromegaly.     Trachea: Trachea and phonation normal. No tracheal tenderness or tracheal deviation.  Cardiovascular:     Rate and Rhythm: Regular rhythm. Tachycardia present.     Pulses:          Radial pulses are 2+ on the right side and 2+ on the left side.     Heart sounds: Normal heart sounds, S1 normal and S2 normal. No murmur heard.    No friction rub. No gallop.  Pulmonary:     Effort: Pulmonary effort is normal. No accessory muscle usage or respiratory distress.     Breath sounds: Normal breath sounds and air entry. No stridor. No decreased breath sounds, wheezing, rhonchi or rales.     Comments: Respirations even and unlabored spoke full sentences withotu difficulty Chest:     Chest wall: No swelling, tenderness, crepitus or edema. There is no dullness to percussion.  Abdominal:     General: There is no distension.     Palpations: Abdomen is soft.     Tenderness: There is no guarding.  Musculoskeletal:        General: No tenderness. Normal range of motion.     Right hand: Normal strength. Normal capillary refill.      Left hand: Normal strength. Normal capillary refill.     Cervical back: Normal range of motion and neck supple. No swelling, edema, deformity, erythema, signs of trauma, lacerations, rigidity, spasms, torticollis, tenderness or crepitus. No pain with movement or muscular tenderness. Normal range of motion.     Thoracic back: No swelling, edema, deformity, signs of trauma, lacerations, spasms or tenderness. Normal range of motion.     Right hip: Normal.     Left hip: Normal.     Right knee: Normal.     Left knee: Normal.     Right lower leg: No edema.     Left lower leg: No edema.  Lymphadenopathy:     Head:     Right side of head: No submental, submandibular, tonsillar, preauricular, posterior auricular or occipital adenopathy.     Left side of head: No submental, submandibular, tonsillar, preauricular, posterior auricular or occipital adenopathy.     Cervical: No cervical adenopathy.     Right cervical: No superficial, deep or posterior cervical adenopathy.    Left  cervical: No superficial, deep or posterior cervical adenopathy.  Skin:    General: Skin is warm and dry.     Capillary Refill: Capillary refill takes less than 2 seconds.     Coloration: Skin is not ashen, cyanotic, jaundiced, mottled, pale or sallow.     Findings: No abrasion, abscess, acne, bruising, burn, ecchymosis, erythema, signs of injury, laceration, lesion, petechiae, rash or wound.     Nails: There is no clubbing.  Neurological:     General: No focal deficit present.     Mental Status: She is alert and oriented to person, place, and time. Mental status is at baseline. She is not disoriented.     GCS: GCS eye subscore is 4. GCS verbal subscore is 5. GCS motor subscore is 6.     Cranial Nerves: Cranial nerves 2-12 are intact. No cranial nerve deficit, dysarthria or facial asymmetry.     Sensory: No sensory deficit.     Motor: Motor function is intact. No weakness, tremor, atrophy, abnormal muscle tone or seizure  activity.     Coordination: Coordination is intact. Coordination normal.     Gait: Gait is intact. Gait normal.     Comments: In/out of chair and on/off exam table without difficulty; gait sure and steady in clinic; bilateral hand grasp equal 5/5  Psychiatric:        Attention and Perception: Attention and perception normal.        Mood and Affect: Mood and affect normal.        Speech: Speech normal.        Behavior: Behavior normal. Behavior is cooperative.        Thought Content: Thought content normal.        Cognition and Memory: Cognition and memory normal.        Judgment: Judgment normal.    Home covid test results negative      Assessment & Plan:  A-viral URI/influenza  P-home covid test free from [place] govt given to patient to complete prior to return to workcenter.  Patient may continue xyzal 5mg  po daily, singulair 10mg  po qhs, normal saline nasal spray 2 sprays each nostril q2h wa as needed. flonase 1 spray each nostril BID chloraseptic throat spray prn per manufacturer instructions or salt water gargles tylenol 1000mg  po q6h prn pain  If covid test negative most likely flu will send in Rx for tamiflu 75mg  po BID x 5 days #10 RF0 to her pharmacy of choice.  Shower prior to bedtime and in am if thick mucous congestion  Discussed no wheezing or fluid in lungs on exam today.  Patient does not feel able to finish her shift sent home under employer communicable disease policy RN Kimrey flu notified HR and supervisor re-evaluation tomorrow by RN Bess Kinds via telephone.    Call or return to clinic as needed if these symptoms worsen or fail to improve as anticipated.   Exitcare handout on viral URI, pharyngitis and sinus rinse  Patient verbalized understanding of instructions, agreed with plan of care and had no further questions at this time.  P2:  Avoidance and hand washing.   Usually no specific medical treatment is needed if a virus is causing the sore throat. The throat most often  gets better on its own within 5 to 7 days. Antibiotic medicine does not cure viral pharyngitis.  -For acute pharyngitis caused by bacteria, your healthcare provider will prescribe an antibiotic.  Tylenol 1000mg  by mouth every 6 hours as  needed for pain or fever OTC ER/call 911 if drooling, unable to swallow, trouble breathing discussed tonsillar abscess symptoms/hot potato voice. -Do not smoke.  -Avoid secondhand smoke and other air pollutants.  -Use a cool mist humidifier to add moisture to the air.  -Get plenty of rest; sleep 7-8 hours per night -You may want to rest your throat by talking less and eating a diet that is mostly liquid or soft for a day or two.  -Nonprescription throat lozenges and mouthwashes should help relieve the soreness.  -Gargling with warm saltwater and drinking warm liquids may help. (You can make a saltwater solution by adding 1/4 teaspoon of salt to 8 ounces, or 240 mL, of warm water.)  -Avoid oral intimate contact until symptoms resolved -Wash dishes/silverware/glasses in hot water or diswasher -Do not share glasses/silverware/dishes during meals that touch your lips/mouth -Usually no specific medical treatment is needed if a virus is causing the sore throat. The throat most often gets better on its own within 5 to 7 days. Antibiotic medicine does not cure viral pharyngitis. DDx mononucleosis, strep throat, hand foot and mouth, post nasal drip irritation pharynx/throat -Exitcare handout on pharyngitis acute  FOLLOW UP with clinic provider if no improvements in the next 7-10 days. Patient verbalized understanding of instructions and agreed with plan of care.  P2: Hand washing and diet.

## 2022-12-12 NOTE — Patient Instructions (Addendum)
Influenza, Adult Influenza, also called "the flu," is a viral infection that mainly affects the respiratory tract. This includes the lungs, nose, and throat. The flu spreads easily from person to person (is contagious). It causes common cold symptoms, along with high fever and body aches. What are the causes? This condition is caused by the influenza virus. You can get the virus by: Breathing in droplets that are in the air from an infected person's cough or sneeze. Touching something that has the virus on it (has been contaminated) and then touching your mouth, nose, or eyes. What increases the risk? The following factors may make you more likely to get the flu: Not washing or sanitizing your hands often. Having close contact with many people during cold and flu season. Touching your mouth, eyes, or nose without first washing or sanitizing your hands. Not getting an annual flu shot. You may have a higher risk for the flu, including serious problems, such as a lung infection (pneumonia), if you: Are older than 65. Are pregnant. Have a weakened disease-fighting system (immune system). This includes people who have HIV or AIDS, are on chemotherapy, or are taking medicines that reduce (suppress) the immune system. Have a long-term (chronic) illness, such as heart disease, kidney disease, diabetes, or lung disease. Have a liver disorder. Are severely overweight (morbidly obese). Have anemia. Have asthma. What are the signs or symptoms? Symptoms of this condition usually begin suddenly and last 4-14 days. These may include: Fever and chills. Headaches, body aches, or muscle aches. Sore throat. Cough. Runny or stuffy (congested) nose. Chest discomfort. Poor appetite. Weakness or fatigue. Dizziness. Nausea or vomiting. How is this diagnosed? This condition may be diagnosed based on: Your symptoms and medical history. A physical exam. Swabbing your nose or throat and testing the fluid  for the influenza virus. How is this treated? If the flu is diagnosed early, you can be treated with antiviral medicine that is given by mouth (orally) or through an IV. This can help reduce how severe the illness is and how long it lasts. Taking care of yourself at home can help relieve symptoms. Your health care provider may recommend: Taking over-the-counter medicines. Drinking plenty of fluids. In many cases, the flu goes away on its own. If you have severe symptoms or complications, you may be treated in a hospital. Follow these instructions at home: Activity Rest as needed and get plenty of sleep. Stay home from work or school as told by your health care provider. Unless you are visiting your health care provider, avoid leaving home until your fever has been gone for 24 hours without taking medicine. Eating and drinking Take an oral rehydration solution (ORS). This is a drink that is sold at pharmacies and retail stores. Drink enough fluid to keep your urine pale yellow. Drink clear fluids in small amounts as you are able. Clear fluids include water, ice chips, fruit juice mixed with water, and low-calorie sports drinks. Eat bland, easy-to-digest foods in small amounts as you are able. These foods include bananas, applesauce, rice, lean meats, toast, and crackers. Avoid drinking fluids that contain a lot of sugar or caffeine, such as energy drinks, regular sports drinks, and soda. Avoid alcohol. Avoid spicy or fatty foods. General instructions     Take over-the-counter and prescription medicines only as told by your health care provider. Use a cool mist humidifier to add humidity to the air in your home. This can make it easier to breathe. When using a cool  mist humidifier, clean it daily. Empty the water and replace it with clean water. Cover your mouth and nose when you cough or sneeze. Wash your hands with soap and water often and for at least 20 seconds, especially after you  cough or sneeze. If soap and water are not available, use alcohol-based hand sanitizer. Keep all follow-up visits. This is important. How is this prevented?  Get an annual flu shot. This is usually available in late summer, fall, or winter. Ask your health care provider when you should get your flu shot. Avoid contact with people who are sick during cold and flu season. This is generally fall and winter. Contact a health care provider if: You develop new symptoms. You have: Chest pain. Diarrhea. A fever. Your cough gets worse. You produce more mucus. You feel nauseous or you vomit. Get help right away if you: Develop shortness of breath or have difficulty breathing. Have skin or nails that turn a bluish color. Have severe pain or stiffness in your neck. Develop a sudden headache or sudden pain in your face or ear. Cannot eat or drink without vomiting. These symptoms may represent a serious problem that is an emergency. Do not wait to see if the symptoms will go away. Get medical help right away. Call your local emergency services (911 in the [place] Do not drive yourself to the hospital. Summary Influenza, also called "the flu," is a viral infection that primarily affects your respiratory tract. Symptoms of the flu usually begin suddenly and last 4-14 days. Getting an annual flu shot is the best way to prevent getting the flu. Stay home from work or school as told by your health care provider. Unless you are visiting your health care provider, avoid leaving home until your fever has been gone for 24 hours without taking medicine. Keep all follow-up visits. This is important. This information is not intended to replace advice given to you by your health care provider. Make sure you discuss any questions you have with your health care provider. Document Revised: 02/13/2020 Document Reviewed: 02/13/2020 Elsevier Patient Education  2023 [employer] Pharyngitis  Pharyngitis is  inflammation of the throat (pharynx). It is a very common cause of sore throat. Pharyngitis can be caused by a bacteria, but it is usually caused by a virus. Most cases of pharyngitis get better on their own without treatment. What are the causes? This condition may be caused by: Infection by viruses (viral). Viral pharyngitis spreads easily from person to person (is contagious) through coughing, sneezing, and sharing of personal items or utensils such as cups, forks, spoons, and toothbrushes. Infection by bacteria (bacterial). Bacterial pharyngitis may be spread by touching the nose or face after coming in contact with the bacteria, or through close contact, such as kissing. Allergies. Allergies can cause buildup of mucus in the throat (post-nasal drip), leading to inflammation and irritation. Allergies can also cause blocked nasal passages, forcing breathing through the mouth, which dries and irritates the throat. What increases the risk? You are more likely to develop this condition if: You are 96-53 years old. You are exposed to crowded environments such as daycare, school, or dormitory living. You live in a cold climate. You have a weakened disease-fighting (immune) system. What are the signs or symptoms? Symptoms of this condition vary by the cause. Common symptoms of this condition include: Sore throat. Fatigue. Low-grade fever. Stuffy nose (nasal congestion) and cough. Headache. Other symptoms may include: Glands in the neck (lymph nodes) that are  swollen. Skin rashes. Plaque-like film on the throat or tonsils. This is often a symptom of bacterial pharyngitis. Vomiting. Red, itchy eyes (conjunctivitis). Loss of appetite. Joint pain and muscle aches. Enlarged tonsils. How is this diagnosed? This condition may be diagnosed based on your medical history and a physical exam. Your health care provider will ask you questions about your illness and your symptoms. A swab of your throat  may be done to check for bacteria (rapid strep test). Other lab tests may also be done, depending on the suspected cause, but these are rare. How is this treated? Many times, treatment is not needed for this condition. Pharyngitis usually gets better in 3-4 days without treatment. Bacterial pharyngitis may be treated with antibiotic medicines. Follow these instructions at home: Medicines Take over-the-counter and prescription medicines only as told by your health care provider. If you were prescribed an antibiotic medicine, take it as told by your health care provider. Do not stop taking the antibiotic even if you start to feel better. Use throat sprays to soothe your throat as told by your health care provider. Children can get pharyngitis. Do not give your child aspirin because of the association with Reye's syndrome. Managing pain To help with pain, try: Sipping warm liquids, such as broth, herbal tea, or warm water. Eating or drinking cold or frozen liquids, such as frozen ice pops. Gargling with a mixture of salt and water 3-4 times a day or as needed. To make salt water, completely dissolve -1 tsp (3-6 g) of salt in 1 cup (237 mL) of warm water. Sucking on hard candy or throat lozenges. Putting a cool-mist humidifier in your bedroom at night to moisten the air. Sitting in the bathroom with the door closed for 5-10 minutes while you run hot water in the shower.  General instructions  Do not use any products that contain nicotine or tobacco. These products include cigarettes, chewing tobacco, and vaping devices, such as e-cigarettes. If you need help quitting, ask your health care provider. Rest as told by your health care provider. Drink enough fluid to keep your urine pale yellow. How is this prevented? To help prevent becoming infected or spreading infection: Wash your hands often with soap and water for at least 20 seconds. If soap and water are not available, use hand  sanitizer. Do not touch your eyes, nose, or mouth with unwashed hands, and wash hands after touching these areas. Do not share cups or eating utensils. Avoid close contact with people who are sick. Contact a health care provider if: You have large, tender lumps in your neck. You have a rash. You cough up green, yellow-brown, or bloody mucus. Get help right away if: Your neck becomes stiff. You drool or are unable to swallow liquids. You cannot drink or take medicines without vomiting. You have severe pain that does not go away, even after you take medicine. You have trouble breathing, and it is not caused by a stuffy nose. You have new pain and swelling in your joints such as the knees, ankles, wrists, or elbows. These symptoms may represent a serious problem that is an emergency. Do not wait to see if the symptoms will go away. Get medical help right away. Call your local emergency services (911 in the [place] Do not drive yourself to the hospital. Summary Pharyngitis is redness, pain, and swelling (inflammation) of the throat (pharynx). While pharyngitis can be caused by a bacteria, the most common causes are viral. Most cases of pharyngitis  get better on their own without treatment. Bacterial pharyngitis is treated with antibiotic medicines. This information is not intended to replace advice given to you by your health care provider. Make sure you discuss any questions you have with your health care provider. Document Revised: 09/22/2020 Document Reviewed: 09/22/2020 Elsevier Patient Education  2024 [employer] How to Perform a Sinus Rinse A sinus rinse is a home treatment that is used to rinse your sinuses with a germ-free (sterile) mixture of salt and water (saline solution). Sinuses are air-filled spaces in your skull that are behind the bones of your face and forehead. They open into your nasal cavity. A sinus rinse can help to clear mucus, dirt, dust, or pollen from your nasal  cavity. You may do a sinus rinse when you have a cold, a virus, nasal allergy symptoms, a sinus infection, or stuffiness in your nose or sinuses. What are the risks? A sinus rinse is generally safe and effective. However, there are a few risks, which include: A burning sensation in your sinuses. This may happen if you do not make the saline solution as directed. Be sure to follow all directions when making the saline solution. Nasal irritation. Infection. This may be from unclean supplies or from contaminated water. Infection from contaminated water is rare, but possible. Do not do a sinus rinse if you have had ear or nasal surgery, ear infection, or plugged ears, unless recommended by your health care provider. Supplies needed: Saline solution or powder. Distilled or sterile water to mix with saline powder. You may use boiled and cooled tap water. Boil tap water for 5 minutes; cool until it is lukewarm. Use within 24 hours. Do not use regular tap water to mix with the saline solution. Neti pot or nasal rinse bottle. These supplies release the saline solution into your nose and through your sinuses. Neti pots and nasal rinse bottles can be purchased at [employer], a health food store, or online. How to perform a sinus rinse  Wash your hands with soap and water for at least 20 seconds. If soap and water are not available, use hand sanitizer. Wash your device according to the directions that came with the product and then dry it. Use the solution that comes with your product or one that is sold separately in stores. Follow the mixing directions on the package to mix with sterile or distilled water. Fill the device with the amount of saline solution noted in the device instructions. Stand by a sink and tilt your head sideways over the sink. Place the spout of the device in your upper nostril (the one closer to the ceiling). Gently pour or squeeze the saline solution into your nasal cavity.  The liquid should drain out from the lower nostril if you are not too congested. While rinsing, breathe through your open mouth. Gently blow your nose to clear any mucus and rinse solution. Blowing too hard may cause ear pain. Turn your head in the other direction and repeat in your other nostril. Clean and rinse your device with clean water and then air-dry it. Talk with your health care provider or pharmacist if you have questions about how to do a sinus rinse. Summary A sinus rinse is a home treatment that is used to rinse your sinuses with a sterile mixture of salt and water (saline solution). You may do a sinus rinse when you have a cold, a virus, nasal allergy symptoms, a sinus infection, or stuffiness in your nose  or sinuses. A sinus rinse is generally safe and effective. Follow all instructions carefully. This information is not intended to replace advice given to you by your health care provider. Make sure you discuss any questions you have with your health care provider. Document Revised: 12/13/2020 Document Reviewed: 12/13/2020 Elsevier Patient Education  2024 [employer] Viral Respiratory Infection A respiratory infection is an illness that affects part of the respiratory system, such as the lungs, nose, or throat. A respiratory infection that is caused by a virus is called a viral respiratory infection. Common types of viral respiratory infections include: A cold. The flu (influenza). A respiratory syncytial virus (RSV) infection. What are the causes? This condition is caused by a virus. The virus may spread through contact with droplets or direct contact with infected people or their mucus or secretions. The virus may spread from person to person (is contagious). What are the signs or symptoms? Symptoms of this condition include: A stuffy or runny nose. A sore throat or cough. Shortness of breath or difficulty breathing. Yellow or green mucus (sputum). Other symptoms may  include: A fever. Sweating or chills. Fatigue. Achy muscles. A headache. How is this diagnosed? This condition may be diagnosed based on: Your symptoms. A physical exam. Testing of secretions from the nose or throat. Chest X-ray. How is this treated? This condition may be treated with medicines, such as: Antiviral medicine. This may shorten the length of time a person has symptoms. Expectorants. These make it easier to cough up mucus. Decongestant nasal sprays. Acetaminophen or NSAIDs, such as ibuprofen, to relieve fever and pain. Antibiotic medicines are not prescribed for viral infections.This is because antibiotics are designed to kill bacteria. They do not kill viruses. Follow these instructions at home: Managing pain and congestion Take over-the-counter and prescription medicines only as told by your health care provider. If you have a sore throat, gargle with a mixture of salt and water 3-4 times a day or as needed. To make salt water, completely dissolve -1 tsp (3-6 g) of salt in 1 cup (237 mL) of warm water. Use nose drops made from salt water to ease congestion and soften raw skin around your nose. Take 2 tsp (10 mL) of honey at bedtime to lessen coughing at night. Do not give honey to children who are younger than 1 year. Drink enough fluid to keep your urine pale yellow. This helps prevent dehydration and helps loosen up mucus. General instructions  Rest as much as possible. Do not drink alcohol. Do not use any products that contain nicotine or tobacco. These products include cigarettes, chewing tobacco, and vaping devices, such as e-cigarettes. If you need help quitting, ask your health care provider. Keep all follow-up visits. This is important. How is this prevented?     Get an annual flu shot. You may get the flu shot in late summer, fall, or winter. Ask your health care provider when you should get your flu shot. Avoid spreading your infection to other people.  If you are sick: Wash your hands with soap and water often, especially after you cough or sneeze. Wash for at least 20 seconds. If soap and water are not available, use alcohol-based hand sanitizer. Cover your mouth when you cough. Cover your nose and mouth when you sneeze. Do not share cups or eating utensils. Clean commonly used objects often. Clean commonly touched surfaces. Stay home from work or school as told by your health care provider. Avoid contact with people who are sick  during cold and flu season. This is generally fall and winter. Contact a health care provider if: Your symptoms last for 10 days or longer. Your symptoms get worse over time. You have severe sinus pain in your face or forehead. The glands in your jaw or neck become very swollen. You have shortness of breath. Get help right away if you: Feel pain or pressure in your chest. Have trouble breathing. Faint or feel like you will faint. Have severe and persistent vomiting. Feel confused or disoriented. These symptoms may represent a serious problem that is an emergency. Do not wait to see if the symptoms will go away. Get medical help right away. Call your local emergency services (911 in the [place] Do not drive yourself to the hospital. Summary A respiratory infection is an illness that affects part of the respiratory system, such as the lungs, nose, or throat. A respiratory infection that is caused by a virus is called a viral respiratory infection. Common types of viral respiratory infections include a cold, influenza, and respiratory syncytial virus (RSV) infection. Symptoms of this condition include a stuffy or runny nose, cough, fatigue, achy muscles, sore throat, and fevers or chills. Antibiotic medicines are not prescribed for viral infections. This is because antibiotics are designed to kill bacteria. They are not effective against viruses. This information is not intended to replace advice given to you by your  health care provider. Make sure you discuss any questions you have with your health care provider. Document Revised: 09/30/2020 Document Reviewed: 09/30/2020 Elsevier Patient Education  2024 [employer].

## 2022-12-12 NOTE — Progress Notes (Signed)
Covid negative

## 2022-12-13 NOTE — Telephone Encounter (Signed)
See office note.  Patient seen in clinic this week and last week.  Denied lip concerns 12/12/22 retained material was removed by RN Kimrey week prior and healed without difficulty.

## 2022-12-13 NOTE — Telephone Encounter (Signed)
Patient reports had upset stomach this am. Still having cough low grade fever 100 tired weak and ache. Patient educated to take tylenol for fever. Continue Tamiflu. Encourage rest, hydration, and bland diet. Notify NP HR and supervisor extend absence will follow up in 24 hrs.

## 2022-12-14 NOTE — Telephone Encounter (Signed)
Patient states some improvement but still really fatigued. No fever. Chest tightness at times with shob. Dizzy spells when getting up. Ate little today for the first time. Encouraged rest hydration and to use albuterol inhaler as needed. Prefer Honey tumeric tea to help breathing. HR and NP notify approved absences extend till June 10 th.

## 2022-12-15 NOTE — Telephone Encounter (Signed)
Noted by RN Bess Kinds patient symptom updated 12/14/22 in clinic.  Agreed with albuterol prn use, rest, hydration and that I would follow up with patient via telephone this weekend for re-evaluation regarding RTW 01/17/23

## 2022-12-24 NOTE — Progress Notes (Signed)
Noted patient has had follow up labs 

## 2023-01-01 ENCOUNTER — Telehealth: Payer: Self-pay | Admitting: Registered Nurse

## 2023-01-01 ENCOUNTER — Encounter: Payer: Self-pay | Admitting: Registered Nurse

## 2023-01-01 MED ORDER — ALBUTEROL SULFATE HFA 108 (90 BASE) MCG/ACT IN AERS: INHALATION_SPRAY | RESPIRATORY_TRACT | 1 refills | Status: AC

## 2023-01-01 NOTE — Telephone Encounter (Signed)
Patient requested albuterol inhaler refill as running low on puffs using prn asthma chest tightness and wheezing   RN Kimrey notified Rx sent to patient pharmacy.

## 2023-01-16 ENCOUNTER — Ambulatory Visit: Payer: No Typology Code available for payment source | Admitting: Registered Nurse

## 2023-01-29 LAB — HM DIABETES EYE EXAM

## 2023-02-01 NOTE — Telephone Encounter (Signed)
[facility] Jan 2023 stated symptoms improved feeling better denied need for appt

## 2023-02-02 ENCOUNTER — Encounter: Payer: Self-pay | Admitting: Internal Medicine

## 2023-02-02 ENCOUNTER — Ambulatory Visit: Payer: No Typology Code available for payment source | Admitting: Internal Medicine

## 2023-02-02 VITALS — BP 138/84 | HR 78 | Temp 98.1°F | Ht 71.0 in | Wt 208.0 lb

## 2023-02-02 DIAGNOSIS — D509 Iron deficiency anemia, unspecified: Secondary | ICD-10-CM

## 2023-02-02 DIAGNOSIS — E1169 Type 2 diabetes mellitus with other specified complication: Secondary | ICD-10-CM

## 2023-02-02 DIAGNOSIS — F32A Depression, unspecified: Secondary | ICD-10-CM

## 2023-02-02 DIAGNOSIS — Z0001 Encounter for general adult medical examination with abnormal findings: Secondary | ICD-10-CM

## 2023-02-02 DIAGNOSIS — F419 Anxiety disorder, unspecified: Secondary | ICD-10-CM

## 2023-02-02 DIAGNOSIS — Z7984 Long term (current) use of oral hypoglycemic drugs: Secondary | ICD-10-CM

## 2023-02-02 DIAGNOSIS — I10 Essential (primary) hypertension: Secondary | ICD-10-CM | POA: Diagnosis not present

## 2023-02-02 DIAGNOSIS — E118 Type 2 diabetes mellitus with unspecified complications: Secondary | ICD-10-CM

## 2023-02-02 DIAGNOSIS — E785 Hyperlipidemia, unspecified: Secondary | ICD-10-CM

## 2023-02-02 LAB — CBC
HCT: 42.1 % (ref 36.0–46.0)
Hemoglobin: 13.6 g/dL (ref 12.0–15.0)
MCHC: 32.2 g/dL (ref 30.0–36.0)
MCV: 88.7 fl (ref 78.0–100.0)
Platelets: 239 10*3/uL (ref 150.0–400.0)
RBC: 4.74 Mil/uL (ref 3.87–5.11)
RDW: 14.3 % (ref 11.5–15.5)
WBC: 6.2 10*3/uL (ref 4.0–10.5)

## 2023-02-02 LAB — LIPID PANEL
Cholesterol: 219 mg/dL — ABNORMAL HIGH (ref 0–200)
HDL: 60.3 mg/dL (ref >39.00)
LDL Cholesterol: 131 mg/dL — ABNORMAL HIGH (ref 0–99)
NonHDL: 158.33
Total CHOL/HDL Ratio: 4
Triglycerides: 138 mg/dL (ref 0.0–149.0)
VLDL: 27.6 mg/dL (ref 0.0–40.0)

## 2023-02-02 LAB — COMPREHENSIVE METABOLIC PANEL
ALT: 59 U/L — ABNORMAL HIGH (ref 0–35)
AST: 39 U/L — ABNORMAL HIGH (ref 0–37)
Albumin: 4.5 g/dL (ref 3.5–5.2)
Alkaline Phosphatase: 64 U/L (ref 39–117)
BUN: 12 mg/dL (ref 6–23)
CO2: 27 mEq/L (ref 19–32)
Calcium: 9.6 mg/dL (ref 8.4–10.5)
Chloride: 101 mEq/L (ref 96–112)
Creatinine, Ser: 0.72 mg/dL (ref 0.40–1.20)
GFR: 91.96 mL/min (ref >60.00)
Glucose, Bld: 154 mg/dL — ABNORMAL HIGH (ref 70–99)
Potassium: 4.2 mEq/L (ref 3.5–5.1)
Sodium: 139 mEq/L (ref 135–145)
Total Bilirubin: 0.4 mg/dL (ref 0.2–1.2)
Total Protein: 7 g/dL (ref 6.0–8.3)

## 2023-02-02 LAB — HEMOGLOBIN A1C: Hgb A1c MFr Bld: 7.7 % — ABNORMAL HIGH (ref 4.6–6.5)

## 2023-02-02 MED ORDER — TRAZODONE HCL 50 MG PO TABS: 25.0000 mg | ORAL_TABLET | Freq: Every evening | ORAL | 3 refills | Status: DC | PRN

## 2023-02-02 NOTE — Progress Notes (Signed)
   Subjective:   Patient ID: Kelly Reed, female    DOB: 02/09/1966, 57 y.o.   MRN: [id number]  HPI The patient is here for physical.  PMH, [facility], social history reviewed and updated  Review of Systems  Constitutional: Negative.   HENT: Negative.    Eyes: Negative.   Respiratory:  Negative for cough, chest tightness and shortness of breath.   Cardiovascular:  Negative for chest pain, palpitations and leg swelling.  Gastrointestinal:  Negative for abdominal distention, abdominal pain, constipation, diarrhea, nausea and vomiting.  Musculoskeletal: Negative.   Skin: Negative.   Neurological: Negative.   Psychiatric/Behavioral:  Positive for sleep disturbance. The patient is nervous/anxious.     Objective:  Physical Exam Constitutional:      Appearance: She is well-developed.  HENT:     Head: Normocephalic and atraumatic.  Cardiovascular:     Rate and Rhythm: Normal rate and regular rhythm.  Pulmonary:     Effort: Pulmonary effort is normal. No respiratory distress.     Breath sounds: Normal breath sounds. No wheezing or rales.  Abdominal:     General: Bowel sounds are normal. There is no distension.     Palpations: Abdomen is soft.     Tenderness: There is no abdominal tenderness. There is no rebound.  Musculoskeletal:     Cervical back: Normal range of motion.  Skin:    General: Skin is warm and dry.  Neurological:     Mental Status: She is alert and oriented to person, place, and time.     Coordination: Coordination normal.     Vitals:   02/02/23 0812  BP: 138/84  Pulse: 78  Temp: 98.1 F (36.7 C)  TempSrc: Oral  SpO2: 99%  Weight: 208 lb (94.3 kg)  Height: 5\' 11"  (1.803 m)    Assessment & Plan:

## 2023-02-02 NOTE — Progress Notes (Signed)
My chart message sent to patient Kelly Reed,  Your cholesterol total and LDL still elevated.  Your HDL (good cholesterol), LDL and total cholesterol numbers improved.  Hgba1c elevated at 7.7 stable from last year.  AST/ALT liver enzymes elevated slightly worse from 4 months ago.  Follow up with your PCM Office visit 09/26/22 BP 110/74 BP met Be Well requirements.  Hgba1c greater than 7 and LDL  greater than 130 did not meet requirements.  Patient had office visit today with PCM continue losartan 50mg  daily on crestor 10mg  daily and may adjust after today's lab results reviewed; same with Hgba1c may adjust rybelsus 14mg  daily and metformin 1000mg  BID depending on results  Patient met alternative requirements for Be Well 2025/insurance discount with PCM visit will complete paperwork for patient when onsite 06 Feb 2023.  RN Bess Kinds will notify HR team met requirements.  See RN Bess Kinds to sign your paperwork next week.  I recommend weight loss, exercise 150 minutes per week; dietary fiber 20 grams women per up to date; eat whole grains/fruits/vegetables; keep added sugars to less than 100 calories/ 5 teaspoons for women per American Heart Association; electrolytes, kidney function, and complete blood count normal    Reminder that [employer] has a free dietitian if you would like their assistance link to schedule appts [facility] ([email or web address]  Please let us know if you have further questions.  Sincerely,  Albina Billet NP-C

## 2023-02-02 NOTE — Assessment & Plan Note (Signed)
BP at goal on losartan 50 mg daily. Checking CMP and adjust as needed. 

## 2023-02-02 NOTE — Assessment & Plan Note (Signed)
Better controlled on wellbutrin 300 mg daily compared to lexapro. Still some concerns about sleep. Adding 25 mg trazodone at bedtime for sleep. Adjust as needed for efficacy based on patient follow up to [place]. Continue wellbutrin 300 mg daily.

## 2023-02-02 NOTE — Assessment & Plan Note (Signed)
Flu shot yearly. Shingrix complete. Tetanus up to date. Colonoscopy up to date. Mammogram up to date, pap smear up to date. Counseled about sun safety and mole surveillance. Counseled about the dangers of distracted driving. Given 10 year screening recommendations.

## 2023-02-02 NOTE — Patient Instructions (Signed)
We will send in the trazodone to help for sleep.

## 2023-02-02 NOTE — Assessment & Plan Note (Signed)
Checking CBC and adjust as needed. No signs of bleeding.

## 2023-02-02 NOTE — Assessment & Plan Note (Signed)
Checking lipid panel and adjust crestor 10 mg daily as needed. 

## 2023-02-02 NOTE — Assessment & Plan Note (Signed)
Foot exam done, checking microalbumin to creatinine ratio and HgA1c and lipid panel and CMP. Adjust as needed her rybelsus 14 mg daily and metformin 1000 mg BID. She is on ARB and statin.

## 2023-02-06 ENCOUNTER — Telehealth: Payer: Self-pay | Admitting: Occupational Medicine

## 2023-02-06 DIAGNOSIS — R509 Fever, unspecified: Secondary | ICD-10-CM

## 2023-02-06 DIAGNOSIS — R112 Nausea with vomiting, unspecified: Secondary | ICD-10-CM

## 2023-02-06 NOTE — Telephone Encounter (Signed)
Patient reports woke up with Fever of 101, Chills, Vomiting x 2,  HA, stuffy nose. Reports negative Covid test. Patient states has nausea/vomiting medication. No Requested medication from NP.   I have recommended clear fluids and bland diet.  Avoid dairy/spicy, fried and large portions of meat while having nausea.  If vomiting hold po intake x 1 hour.  Then sips clear fluids like broths, ginger ale, power ade, gatorade, pedialyte may advance to soft/bland if no vomiting x 24 hours and appetite returned otherwise hydration main focus.     Avoid dairy, spicy and fried foods until diarrhea resolves. Avoid dehydration drink noncaffeinated beverages (water, ginger ale, soup broth, popsicles, no sugar added gatorade/powerade) to urinate every 2-4 hours pale yellow urine.  It is easy to become dehydrated when having vomiting along with electrolyte imbalances. Contact clinic if symptoms persist or worsen; I have alerted the patient to call if high fever, dehydration, marked weakness, fainting, increased abdominal pain, blood in stool or vomit. Patient to stay due to infectious disease policy. HR NP and Supervisor made aware not able to return till 24 hrs free of symptoms. Patient verbalized agreement and understanding of treatment plan and had no further questions at this time.

## 2023-02-07 ENCOUNTER — Encounter: Payer: Self-pay | Admitting: Occupational Medicine

## 2023-02-07 ENCOUNTER — Telehealth: Payer: Self-pay | Admitting: Internal Medicine

## 2023-02-07 NOTE — Telephone Encounter (Signed)
Spoke with patient via telephone stated fever resolved yesterday keeping food down and feeling better today still some fatigue.  Yesterday worst symptoms was nausea then vomiting took her nausea/vomiting medication and it stopped.  Had crackers and soupe yesterday around 1400 then took name and denied more solids intake only had gatorade and water yesterday evening.  Breakfast today yogurt and dry toast.  Banana mid day.  Urinating usual amount and frequency.  Continuing to drink gatorade and water.  Feels able to return tomorrow to work.  Discussed use normal call out procedures if fever, vomiting or diarrhea between now and returning to work tomorrow.  RN Kimrey notified fever/vomiting resolved yesterday and cleared to return onsite tomorrow.  Notify HR team/supervisor.  Patient A&Ox3 spoke full sentences without difficulty no audible cough/congestion nasal/throat clearing/wheezing/gagging/emesis during 2 minute call.  Discussed with patient continue bland diet today and hydration.  Patient agreed with plan of care and had no further questions at this time.

## 2023-02-07 NOTE — Telephone Encounter (Signed)
Patient returned [name]'s call about her recent lab results. She would like a call back at [phone].

## 2023-02-08 NOTE — Telephone Encounter (Signed)
Called pt and LVM 1st attempt 

## 2023-02-08 NOTE — Telephone Encounter (Signed)
Patient returned [name]'s call and would like a call back at [phone].

## 2023-02-08 NOTE — Telephone Encounter (Signed)
Patient called back about her lab results. She was confused by the message Dr. Okey Dupre had put in MyChart about increasing her cholesterol medication because she said she is not on any cholesterol medication. Patient also said she has been diligent about taking her diabetes medication and is wondering if it needs to be tweaked since her A1C is not where it needs to be. Patient would like a call back at [phone].

## 2023-02-08 NOTE — Telephone Encounter (Signed)
Im closing this note due to me still having the result note open. I will continue response for MD through that note

## 2023-02-13 ENCOUNTER — Other Ambulatory Visit: Payer: Self-pay | Admitting: Internal Medicine

## 2023-02-15 ENCOUNTER — Other Ambulatory Visit: Payer: Self-pay | Admitting: Internal Medicine

## 2023-02-20 ENCOUNTER — Ambulatory Visit: Payer: No Typology Code available for payment source | Admitting: Registered Nurse

## 2023-02-20 ENCOUNTER — Encounter: Payer: Self-pay | Admitting: Registered Nurse

## 2023-02-20 VITALS — BP 144/98 | HR 84

## 2023-02-20 DIAGNOSIS — J019 Acute sinusitis, unspecified: Secondary | ICD-10-CM

## 2023-02-20 MED ORDER — COVID-19 ANTIGEN TEST VI KIT: 1.0000 | PACK | Freq: Every day | 1 refills | Status: DC | PRN

## 2023-02-20 NOTE — Progress Notes (Signed)
Subjective:    Patient ID: Kelly Reed, female    DOB: [birth year], 57 y.o.   MRN: [id number]  56y/o married caucasian female established patient here for evaluation not feeling well today stuffy head, body aches and stuffy ears has not home covid tested.  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  Denied fever/chills/n/v/d/wheezing/cough/sore throat//n/v/d/loss of taste or smell.     Review of Systems  Constitutional:  Positive for fatigue. Negative for chills, diaphoresis and fever.  HENT:  Positive for hearing loss, sinus pressure and sinus pain. Negative for congestion, ear discharge, ear pain, facial swelling, nosebleeds, sneezing, sore throat, tinnitus, trouble swallowing and voice change.   Eyes:  Negative for photophobia and visual disturbance.  Respiratory:  Negative for cough, shortness of breath and wheezing.   Cardiovascular:  Negative for chest pain.  Gastrointestinal:  Negative for diarrhea, nausea and vomiting.  Genitourinary:  Negative for difficulty urinating.  Musculoskeletal:  Positive for myalgias. Negative for back pain, gait problem, neck pain and neck stiffness.  Skin:  Negative for rash.  Allergic/Immunologic: Positive for environmental allergies.  Neurological:  Positive for headaches. Negative for dizziness, tremors, seizures, syncope, facial asymmetry, speech difficulty, weakness, light-headedness and numbness.  Psychiatric/Behavioral:  Negative for agitation, confusion and sleep disturbance.        Objective:   Physical Exam Vitals and nursing note reviewed.  Constitutional:      General: She is awake. She is not in acute distress.    Appearance: Normal appearance. She is well-developed and well-groomed. She is obese. She is ill-appearing. She is not toxic-appearing or diaphoretic.  HENT:     Head: Normocephalic and atraumatic.     Jaw:  There is normal jaw occlusion. No trismus.     Salivary Glands: Right salivary gland is not diffusely enlarged or tender. Left salivary gland is not diffusely enlarged or tender.     Right Ear: Hearing, ear canal and external ear normal. No decreased hearing noted. No laceration, drainage, swelling or tenderness. A middle ear effusion is present. There is no impacted cerumen. No foreign body. No mastoid tenderness. No PE tube. No hemotympanum. Tympanic membrane is not injected, scarred, perforated, erythematous, retracted or bulging.     Left Ear: Hearing, ear canal and external ear normal. No decreased hearing noted. No laceration, drainage, swelling or tenderness. A middle ear effusion is present. There is no impacted cerumen. No foreign body. No mastoid tenderness. No PE tube. No hemotympanum. Tympanic membrane is not injected, scarred, perforated, erythematous, retracted or bulging.     Ears:     Comments: Bilateral TMs intact; air fluid level clear; no debris in bilateral auditory canals    Nose: Mucosal edema, congestion and rhinorrhea present. No nasal deformity, septal deviation or laceration. Rhinorrhea is clear.     Right Turbinates: Enlarged and swollen. Not pale.     Left Turbinates: Enlarged and swollen. Not pale.     Right Sinus: Maxillary sinus tenderness and frontal sinus tenderness present.     Left Sinus: Maxillary sinus tenderness and frontal sinus tenderness present.     Comments: Frontal sinuses more painful to palpation than maxillary bilaterally; clear discharge bilateral nasal turbintes    Mouth/Throat:     Lips: Pink. No lesions.     Mouth: Mucous membranes are moist. Mucous membranes are not pale, not dry and not cyanotic.  No lacerations, oral lesions or angioedema.     Dentition: No dental abscesses or gum lesions.     Tongue: No lesions. Tongue does not deviate from midline.     Palate: No mass and lesions.     Pharynx: Uvula midline. Pharyngeal swelling, posterior  oropharyngeal erythema and postnasal drip present. No oropharyngeal exudate or uvula swelling.     Tonsils: No tonsillar exudate or tonsillar abscesses.     Comments: Cobblestoning posterior pharynx; bilateral allergic shiners; clear discharge bilateral nasal turbinates edema erythema  Eyes:     General: Lids are normal. Vision grossly intact. Gaze aligned appropriately. Allergic shiner present. No scleral icterus.       Right eye: No foreign body, discharge or hordeolum.        Left eye: No foreign body, discharge or hordeolum.     Extraocular Movements: Extraocular movements intact.     Right eye: Normal extraocular motion and no nystagmus.     Left eye: Normal extraocular motion and no nystagmus.     Conjunctiva/sclera: Conjunctivae normal.     Right eye: Right conjunctiva is not injected. No chemosis, exudate or hemorrhage.    Left eye: Left conjunctiva is not injected. No chemosis, exudate or hemorrhage.    Pupils: Pupils are equal, round, and reactive to light. Pupils are equal.     Right eye: Pupil is round and reactive.     Left eye: Pupil is round and reactive.  Neck:     Thyroid: No thyroid mass or thyromegaly.     Trachea: Trachea and phonation normal. No tracheal tenderness or tracheal deviation.  Cardiovascular:     Rate and Rhythm: Normal rate and regular rhythm.     Pulses: Normal pulses.          Radial pulses are 2+ on the right side and 2+ on the left side.     Heart sounds: Normal heart sounds, S1 normal and S2 normal.  Pulmonary:     Effort: Pulmonary effort is normal. No accessory muscle usage or respiratory distress.     Breath sounds: Normal breath sounds and air entry. No stridor, decreased air movement or transmitted upper airway sounds. No decreased breath sounds, wheezing, rhonchi or rales.     Comments: Spoke full sentences without difficulty; BBS CTA; no cough or throat clearing observed in exam room Chest:     Chest wall: No tenderness.  Abdominal:      General: There is no distension.     Palpations: Abdomen is soft.     Tenderness: There is no guarding.  Musculoskeletal:        General: No tenderness. Normal range of motion.     Right hand: Normal strength. Normal capillary refill.     Left hand: Normal strength. Normal capillary refill.     Cervical back: Normal range of motion and neck supple. No swelling, edema, deformity, erythema, signs of trauma, lacerations, rigidity, spasms, torticollis, tenderness, bony tenderness or crepitus. No pain with movement. Normal range of motion.     Thoracic back: No swelling, edema, deformity, signs of trauma, lacerations, spasms, tenderness or bony tenderness. Normal range of motion.     Right lower leg: No edema.     Left lower leg: No edema.  Lymphadenopathy:     Head:     Right side of head: No submental, submandibular, tonsillar, preauricular, posterior auricular or occipital adenopathy.     Left side of head: No submental, submandibular, tonsillar, preauricular, posterior auricular or  occipital adenopathy.     Cervical: Cervical adenopathy present.     Right cervical: Posterior cervical adenopathy present. No superficial or deep cervical adenopathy.    Left cervical: Posterior cervical adenopathy present. No superficial or deep cervical adenopathy.     Comments: Posterior cervical lymph nodes palpated nontender and no discrete nodules or shottiness but feel "full" compared to anterior  Skin:    General: Skin is warm and dry.     Capillary Refill: Capillary refill takes less than 2 seconds.     Coloration: Skin is not ashen, cyanotic, jaundiced, mottled, pale or sallow.     Findings: No abrasion, abscess, acne, bruising, burn, ecchymosis, erythema, signs of injury, laceration, lesion, petechiae, rash or wound.     Nails: There is no clubbing.  Neurological:     General: No focal deficit present.     Mental Status: She is alert and oriented to person, place, and time. Mental status is at  baseline. She is not disoriented.     GCS: GCS eye subscore is 4. GCS verbal subscore is 5. GCS motor subscore is 6.     Cranial Nerves: Cranial nerves 2-12 are intact. No cranial nerve deficit, dysarthria or facial asymmetry.     Sensory: No sensory deficit.     Motor: Motor function is intact. No weakness, tremor, atrophy, abnormal muscle tone or seizure activity.     Coordination: Coordination is intact. Coordination normal.     Gait: Gait is intact. Gait normal.     Comments: In/out of chair without difficulty; gait sure and steady in clinic; bilateral hand grasp equal 5/5  Psychiatric:        Attention and Perception: Attention and perception normal.        Mood and Affect: Mood and affect normal.        Speech: Speech normal.        Behavior: Behavior normal. Behavior is cooperative.        Thought Content: Thought content normal.        Cognition and Memory: Cognition and memory normal.        Judgment: Judgment normal.     Home covid test negative discussed retest in 48 hours if no improvement or worsening of symptoms and if negative repeat again in 48 hours and wear mask when around others.  Current covid strain/variant taking longer to turn positive on home tests.  Stay home if fever 100.5, vomiting or diarrhea and notify clinic staff via my chart, email [email or web address]  or [phone] M-Th 8a-5p.  Patient agreed with plan of care and had no further questions at this time.     Assessment & Plan:   A-acute rhino sinusitis  P-Discussed with patient covid cases increasing in state and wastewater levels [place] high category this week. Home covid test now before returning to workcenter given 1 free [place] govt test to perform increase nasal saline use 2 sprays each nostril q2h prn congestion/dust exposure given 1 bottle from clinic stock, continue flonase nasal 1 spray each nostril BID, and oral antihistamine, singulair bedtime.  Trial phenyleprhrine 10mg  po q6h prn rhintiis given 4 UD  from clinic stock.  Discussed most likely viral since having body aches, fatigue also.  Patient refused prednisone.  Mucous clear and antibiotics do not help viral illness.  [facility]care handout sinus pain and sinus rinse. Follow up if cough/wheezing not relieved with prn albuterol, worsening despite plan of care.   Patient agreed with plan of care and had no further  questions at this time.

## 2023-02-20 NOTE — Patient Instructions (Signed)
 How to Perform a Sinus Rinse A sinus rinse is a home treatment that is used to rinse your sinuses with a germ-free (sterile) mixture of salt and water (saline solution). Sinuses are air-filled spaces in your skull that are behind the bones of your face and forehead. They open into your nasal cavity. A sinus rinse can help to clear mucus, dirt, dust, or pollen from your nasal cavity. You may do a sinus rinse when you have a cold, a virus, nasal allergy symptoms, a sinus infection, or stuffiness in your nose or sinuses. What are the risks? A sinus rinse is generally safe and effective. However, there are a few risks, which include: A burning sensation in your sinuses. This may happen if you do not make the saline solution as directed. Be sure to follow all directions when making the saline solution. Nasal irritation. Infection. This may be from unclean supplies or from contaminated water. Infection from contaminated water is rare, but possible. Do not do a sinus rinse if you have had ear or nasal surgery, ear infection, or plugged ears, unless recommended by your health care provider. Supplies needed: Saline solution or powder. Distilled or sterile water to mix with saline powder. You may use boiled and cooled tap water. Boil tap water for 5 minutes; cool until it is lukewarm. Use within 24 hours. Do not use regular tap water to mix with the saline solution. Neti pot or nasal rinse bottle. These supplies release the saline solution into your nose and through your sinuses. Neti pots and nasal rinse bottles can be purchased at [employer], a health food store, or online. How to perform a sinus rinse  Wash your hands with soap and water for at least 20 seconds. If soap and water are not available, use hand sanitizer. Wash your device according to the directions that came with the product and then dry it. Use the solution that comes with your product or one that is sold separately in stores.  Follow the mixing directions on the package to mix with sterile or distilled water. Fill the device with the amount of saline solution noted in the device instructions. Stand by a sink and tilt your head sideways over the sink. Place the spout of the device in your upper nostril (the one closer to the ceiling). Gently pour or squeeze the saline solution into your nasal cavity. The liquid should drain out from the lower nostril if you are not too congested. While rinsing, breathe through your open mouth. Gently blow your nose to clear any mucus and rinse solution. Blowing too hard may cause ear pain. Turn your head in the other direction and repeat in your other nostril. Clean and rinse your device with clean water and then air-dry it. Talk with your health care provider or pharmacist if you have questions about how to do a sinus rinse. Summary A sinus rinse is a home treatment that is used to rinse your sinuses with a sterile mixture of salt and water (saline solution). You may do a sinus rinse when you have a cold, a virus, nasal allergy symptoms, a sinus infection, or stuffiness in your nose or sinuses. A sinus rinse is generally safe and effective. Follow all instructions carefully. This information is not intended to replace advice given to you by your health care provider. Make sure you discuss any questions you have with your health care provider. Document Revised: 12/13/2020 Document Reviewed: 12/13/2020 Elsevier Patient Education  2024 [employer] Sinus  Pain  Sinus pain may occur when your sinuses become clogged or swollen. Sinuses are air-filled spaces in your skull that are behind the bones of your face and forehead. Sinus pain can range from mild to severe. What are the causes? Sinus pain can result from various conditions that affect the sinuses. Common causes include: Colds. Sinus infections. Allergies. What are the signs or symptoms? The main symptom of this condition is  pain or pressure in your face, forehead, ears, or upper teeth. People who have sinus pain often have other symptoms, such as: Congested or runny nose. Fever. Inability to smell. Headache. Weather changes can make symptoms worse. How is this diagnosed? Your health care provider will diagnose this condition based on your symptoms and a physical exam. If you have pain that keeps coming back or does not go away, your health care provider may recommend more testing. This may include: Imaging tests, such as a CT scan or MRI, to check for problems with your sinuses. Examination of your sinuses using a thin tool with a camera that is inserted through your nose (endoscopy). How is this treated? Treatment for this condition depends on the cause. Sinus pain that is caused by a sinus infection may be treated with antibiotic medicine. Sinus pain that is caused by congestion may be helped by rinsing out (flushing) the nose and sinuses with saline solution. Sinus pain that is caused by allergies may be helped by allergy medicines (antihistamines) and medicated nasal sprays. Sinus surgery may be needed in some cases if other treatments do not help. Follow these instructions at home: General instructions If directed: Apply a warm, moist washcloth to your face to help relieve pain. Use a nasal saline wash. Follow the directions on the bottle or box. Hydrate and humidify Drink enough water to keep your urine clear or pale yellow. Staying hydrated will help to thin your mucus. Use a humidifier if your home is dry. Inhale steam for 10-15 minutes, 3-4 times a day or as told by your health care provider. You can do this in the bathroom while a hot shower is running. Limit your exposure to cool or dry air. Medicines  Take over-the-counter and prescription medicines only as told by your health care provider. If you were prescribed an antibiotic medicine, take it as told by your health care provider. Do not stop  taking the antibiotic even if you start to feel better. If you have congestion, use a nasal spray to help lessen pressure. Contact a health care provider if: You have sinus pain more than one time a week. You have sensitivity to light or sound. You develop a fever. You feel nauseous or you vomit. Your sinus pain or headache does not get better with treatment. Get help right away if: You have vision problems. You have sudden, severe pain in your face or head. You have a seizure. You are confused. You have a stiff neck. Summary Sinus pain occurs when your sinuses become clogged or swollen. Sinus pain can result from various conditions that affect the sinuses, such as a cold, a sinus infection, or an allergy. Treatment for this condition depends on the cause. It may include medicine, such as antibiotics or antihistamines. This information is not intended to replace advice given to you by your health care provider. Make sure you discuss any questions you have with your health care provider. Document Revised: 05/29/2021 Document Reviewed: 05/29/2021 Elsevier Patient Education  2024 [employer].

## 2023-02-23 ENCOUNTER — Telehealth: Payer: Self-pay | Admitting: Registered Nurse

## 2023-02-23 MED ORDER — SALINE SPRAY 0.65 % NA SOLN: 2.0000 | NASAL | Status: DC

## 2023-02-23 NOTE — Telephone Encounter (Signed)
Patient returned to work 02/08/23 some fatigue denied questions or concerns.  A&Ox3 spoke full sentences without difficulty denied headache/dizziness or weakness. Skin warm dry and pink and respirations even and unlabored in warehouse/workcenter.  Gait sure and steady.

## 2023-02-23 NOTE — Telephone Encounter (Signed)
Patient last seen in clinic 02/20/23 see office note.  Seen in lunchroom today tolerating po intake without difficulty   Stated feeling well denied concerns.  A&Ox3 spoke full sentences without difficulty skin warm dry and pink no observed cough/throat clearing/nasal sniffing/congestion or wheezing/shortness of breath.  Discussed follow up re-evaluation if new or worsening symptoms.  Patient agreed with plan of care and had no further questions at that time.

## 2023-02-24 ENCOUNTER — Other Ambulatory Visit: Payer: Self-pay | Admitting: Internal Medicine

## 2023-02-26 ENCOUNTER — Other Ambulatory Visit: Payer: Self-pay | Admitting: Internal Medicine

## 2023-02-26 MED ORDER — ROSUVASTATIN CALCIUM 5 MG PO TABS: 5.0000 mg | ORAL_TABLET | ORAL | 3 refills | Status: DC

## 2023-02-26 NOTE — Telephone Encounter (Signed)
Please call patient back

## 2023-02-28 NOTE — Telephone Encounter (Signed)
Called pt and spoke with her.

## 2023-03-02 ENCOUNTER — Telehealth: Payer: Self-pay | Admitting: Registered Nurse

## 2023-03-02 ENCOUNTER — Encounter: Payer: Self-pay | Admitting: Registered Nurse

## 2023-03-02 NOTE — Telephone Encounter (Signed)
Patient reported hoarse voice, nasal congestion, cough this week.  Taking all her prescribed medications flonase, nasal saline, antihistamine, inhaler, singulair.  Patient seen in workcenter BBS coarse but no auscultated or audible wheezing.  Intermittent mild cough/throat clearing observed.  Patient does not want prednisone.  Home covid test negative.  Denied fever/chills/n/v/d/chest pain/headache.  Stated would follow up for evaluation if new or worsening symptoms with [facility] staff or PCM.  Patient A&Ox3 skin warm dry and pink respirations even and unlabored no rhinitis from nares observed.  Denied sore throat/sinus pain/ear pain.  Patient verbalized understanding information/instructions, agreed with plan of care and had no further questions at this time.

## 2023-04-16 ENCOUNTER — Encounter: Payer: Self-pay | Admitting: Registered Nurse

## 2023-04-16 DIAGNOSIS — R112 Nausea with vomiting, unspecified: Secondary | ICD-10-CM

## 2023-04-16 NOTE — Telephone Encounter (Signed)
Left message for patient to contact me at [phone]  HR team and supervisor notified excused absence for 48 hours due to GI upset.  Re-evaluation 04/17/23.  Patient returned call stated both sons had been in hospital over the last 2 weeks 1 with diverticulitis and the other with kidney problems.  She stated her symptoms started Saturday 04/14/23 nausea and vomiting.  Fever today 101.  Home covid test negative.  Feeling constipated and a little bloated today.  Tried drinking black coffee yesterday and today to help have stool.  Yesterday had chicken noodle soup, toast and banana along with oral fluids.  Blood sugars her normal on check at home.  Started zofran today had left over from previous illness.  Discussed with patient viral illnesses circulating in community, covid, other URI and GI bug.  Symptoms lasting a couple days to a week for GI upset with other patients.   I have recommended clear fluids and bland diet.  Avoid dairy/spicy, fried and large portions of meat while having nausea.  If vomiting hold po intake x 1 hour.  Then sips clear fluids like broths, ginger ale, power ade, gatorade, pedialyte may advance to soft/bland if no vomiting x 24 hours and appetite returned otherwise hydration main focus.  zofran 4mg  po BID prn n/v Return to the clinic if symptoms persist or worsen; I have alerted the patient to call if high fever, dehydration, marked weakness, fainting, increased abdominal pain, blood in stool or vomit (red or black).   Exitcare handout on nausea/vomiting.  Discussed with patient mildly dehydrated today.  Coffee/Tea are diuretic recommended gatorade/nsondairy popsicles/ginger ale/broths first line if possible or alternating tea/water.  Discussed she has had a low fiber diet and may not have BM until she has more fibrous intake.  Patient agreed she hasn't had normal po intake since Friday 04/13/23.  Patient stated my phone all woke her up from nap.  Last vomiting was 0200 this am woke her up.   Emesis was liquid only.  Patient A&Ox3 spoke full sentences without difficulty respirations even and unlabored RA no audible cough/wheezing; mild audible nasal congestion during 8 minute call. Patient verbalized agreement and understanding of treatment plan and had no further questions at this time.

## 2023-04-17 NOTE — Telephone Encounter (Signed)
Spoke with patient via telephone 04/17/23 at 1356 stated feeling much better today denied vomiting/diarrhea/fever since last telephone call yesterday tolerating po intake without difficulty ready to come back to work tomorrow.  Discussed cleared to return onsite as long as no fever greater than 100.55F, vomiting or diarrhea between now and returning onsite.  Use normal call out procedures tomorrow if unable to return to work.  Discussed no clinic RN onsite tomorrow and that I can be reached via my chart or [email or web address] and I will be onsite 04/19/23 11a-2pm.  Patient A&Ox3 spoke full sentences without dificulty no throat clearing/cough audible during 2 minute telephone call.  Patient agreed with plan of care and had no further questions at this time.  Supervisor and HR notified patient cleared to return onsite 04/18/23

## 2023-04-18 ENCOUNTER — Encounter: Payer: Self-pay | Admitting: Registered Nurse

## 2023-04-18 DIAGNOSIS — R509 Fever, unspecified: Secondary | ICD-10-CM

## 2023-04-18 DIAGNOSIS — R112 Nausea with vomiting, unspecified: Secondary | ICD-10-CM

## 2023-04-18 NOTE — Telephone Encounter (Signed)
Spoke with patient via telephone at 1630 today stated went back to bed after sending my chart message took tylenol 1000mg  po for fever when woke up around lunch nauseaus again took zofran.  Last urinated 1600 normal yellow color.  Intake today clear liquids low energy slept most of day  1 16oz bottle of water the broth from can chicken noodle soup did not eat noodles.  Fever broke after tylenol and has not vomited this afternoon.  May continue zofran 4mg  po TID prn and tylenol 1000mg  po q6h prn fever/pain as needed.  Avoid dehydration.  Discussed with patient excused absence extended 48 hours since fever and vomiting this am supervisor and HR team notified.  RN Audria Nine will call to follow up with her tomorrow prior to 2 pm as dept work meeting in [place] 2-4pm Thursday.  Patient A&Ox3 spoke full sentences without difficulty some nasal congestion audible, patient low energy level to voice sounds fatigued would not be able to stand all shift and carry product tomorrow either.  Discussed continue clear liquid diet and advance once 24 hours free of vomiting as tolerated continue hydration with water and soup broths.  Consider bland diet tomorrow e.g. noodles/banana/toast.  Patient verbalized understanding information/instructions, agreed with plan of care and had no further questions at this time.

## 2023-04-19 ENCOUNTER — Encounter: Payer: Self-pay | Admitting: Registered Nurse

## 2023-04-22 NOTE — Telephone Encounter (Signed)
Spoke with patient via telephone at 1718 stated last zofran dose Thursday and last fever Thursday.  Patient reported Friday lunchtime started to feel much better.  Tolerating regular diet and ready to return to work tomorrow.  Discussed I would notify HR and supervisor cleared to return onsite to work tomorrow.  Patient A&Ox3 spoke full sentences without difficulty no audible congestion/cough/throat clearing and energy to voice higher than Thursday does not sound fatigued today.  Patient agreed with plan of care and had no further questions at this time.  Duration of call 2 minutes.  HR/supervisor notified may return onsite.

## 2023-04-30 ENCOUNTER — Ambulatory Visit: Payer: No Typology Code available for payment source

## 2023-04-30 DIAGNOSIS — Z23 Encounter for immunization: Secondary | ICD-10-CM

## 2023-05-08 ENCOUNTER — Encounter: Payer: Self-pay | Admitting: Registered Nurse

## 2023-05-08 ENCOUNTER — Ambulatory Visit: Payer: No Typology Code available for payment source | Admitting: Registered Nurse

## 2023-05-08 VITALS — BP 96/55 | HR 90 | Temp 97.2°F | Resp 16 | Ht 71.0 in | Wt 196.5 lb

## 2023-05-08 DIAGNOSIS — M25531 Pain in right wrist: Secondary | ICD-10-CM

## 2023-05-08 DIAGNOSIS — R202 Paresthesia of skin: Secondary | ICD-10-CM

## 2023-05-08 MED ORDER — BIOFREEZE COOL THE PAIN 4 % EX GEL: 1.0000 | Freq: Four times a day (QID) | CUTANEOUS | Status: AC | PRN

## 2023-05-08 MED ORDER — ACETAMINOPHEN 500 MG PO TABS: 1000.0000 mg | ORAL_TABLET | Freq: Four times a day (QID) | ORAL | Status: AC | PRN

## 2023-05-08 NOTE — Patient Instructions (Signed)
Wrist Pain, Adult There are many things that can cause wrist pain. Some common causes include: An injury to the wrist area, such as a sprain, strain, or broken bone (fracture). Overuse of the joint. A condition that causes increased pressure on a nerve in the wrist (carpal tunnel syndrome). Wear and tear of the joints that occurs with aging (osteoarthritis). Other types of joint inflammation and stiffness (arthritis). Sometimes, the cause of wrist pain is not known. Often, the pain goes away when you follow instructions from your health care provider for relieving pain at home. This may include resting the wrist, icing the wrist, or using a splint or an elastic wrap for a short time. If your wrist pain continues, it is important to tell your provider. Follow these instructions at home: If you have a removable splint or elastic wrap: Wear the splint or wrap as told by your provider. Remove it only as told by your provider. Ask your provider if you may remove it for bathing. Check the skin around the splint or wrap every day. Tell your provider about any concerns. Loosen the splint or wrap if your fingers tingle, become numb, or turn cold and blue. Keep the splint or wrap clean. If the splint or wrap is not waterproof: Do not let it get wet. Cover it with a watertight covering when you take a bath or shower. Managing pain, stiffness, and swelling  If told, put ice on the painful area. If you have a removable splint or wrap, remove it as told by your provider. Put ice in a plastic bag. Place a towel between your skin and the bag or between your splint or wrap and the bag. Leave the ice on for 20 minutes, 2-3 times a day. If your skin turns bright red, remove the ice right away to prevent skin damage. The risk of damage is higher if you cannot feel pain, heat, or cold. Move your fingers often to reduce stiffness and swelling. Raise (elevate) the injured area above the level of your heart while  you are sitting or lying down. Activity Rest your affected wrist as told by your provider. Return to your normal activities as told by your provider. Ask your provider what activities are safe for you. Ask your provider when it is safe to drive if you have a splint or wrap on your wrist. Do exercises as told by your provider. General instructions Pay attention to any changes in your symptoms. Take over-the-counter and prescription medicines only as told by your provider. Contact a health care provider if: You have a sudden, sharp pain in the wrist, hand, or arm that is different or new. Any swelling or bruising on your wrist or hand gets worse. Your skin becomes red, has a rash, or has open sores. Your pain does not get better or it gets worse. You have a fever or chills. Get help right away if: You lose feeling in your fingers or hand. Your fingers turn white, very red, or cold and blue. You cannot move your fingers. This information is not intended to replace advice given to you by your health care provider. Make sure you discuss any questions you have with your health care provider. Document Revised: 03/31/2022 Document Reviewed: 03/31/2022 Elsevier Patient Education  2024 [employer] Carpal Tunnel Syndrome  Carpal tunnel syndrome is a condition that causes pain, numbness, and weakness in your hand and fingers. The carpal tunnel is a narrow area located on the palm side of your  wrist. Repeated wrist motion or certain diseases may cause swelling within the tunnel. This swelling pinches the main nerve in the wrist. The main nerve in the wrist is called the median nerve. What are the causes? This condition may be caused by: Repeated and forceful wrist and hand motions. Wrist injuries. Arthritis. A cyst or tumor in the carpal tunnel. Fluid buildup during pregnancy. Use of tools that vibrate. Sometimes the cause of this condition is not known. What increases the risk? The  following factors may make you more likely to develop this condition: Having a job that requires you to repeatedly or forcefully move your wrist or hand or requires you to use tools that vibrate. This may include jobs that involve using computers, working on an [employer], or working with power tools such as Radiographer, therapeutic. Being a woman. Having certain conditions, such as: Diabetes. Obesity. An underactive thyroid (hypothyroidism). Kidney failure. Rheumatoid arthritis. What are the signs or symptoms? Symptoms of this condition include: A tingling feeling in your fingers, especially in your thumb, index, and middle fingers. Tingling or numbness in your hand. An aching feeling in your entire arm, especially when your wrist and elbow are bent for a long time. Wrist pain that goes up your arm to your shoulder. Pain that goes down into your palm or fingers. A weak feeling in your hands. You may have trouble grabbing and holding items. Your symptoms may feel worse during the night. How is this diagnosed? This condition is diagnosed with a medical history and physical exam. You may also have tests, including: Electromyogram (EMG). This test measures electrical signals sent by your nerves into the muscles. Nerve conduction study. This test measures how well electrical signals pass through your nerves. Imaging tests, such as X-rays, ultrasound, and MRI. These tests check for possible causes of your condition. How is this treated? This condition may be treated with: Lifestyle changes. It is important to stop or change the activity that caused your condition. Doing exercise and activities to strengthen and stretch your muscles and tendons (physical therapy). Making lifestyle changes to help with your condition and learning how to do your daily activities safely (occupational therapy). Medicines for pain and inflammation. This may include medicine that is injected into your wrist. A wrist  splint or brace. Surgery. Follow these instructions at home: If you have a splint or brace: Wear the splint or brace as told by your health care provider. Remove it only as told by your health care provider. Loosen the splint or brace if your fingers tingle, become numb, or turn cold and blue. Keep the splint or brace clean. If the splint or brace is not waterproof: Do not let it get wet. Cover it with a watertight covering when you take a bath or shower. Managing pain, stiffness, and swelling If directed, put ice on the painful area. To do this: If you have a removeable splint or brace, remove it as told by your health care provider. Put ice in a plastic bag. Place a towel between your skin and the bag or between the splint or brace and the bag. Leave the ice on for 20 minutes, 2-3 times a day. Do not fall asleep with the cold pack on your skin. Remove the ice if your skin turns bright red. This is very important. If you cannot feel pain, heat, or cold, you have a greater risk of damage to the area. Move your fingers often to reduce stiffness and swelling. General  instructions Take over-the-counter and prescription medicines only as told by your health care provider. Rest your wrist and hand from any activity that may be causing your pain. If your condition is work related, talk with your employer about changes that can be made, such as getting a wrist pad to use while typing. Do any exercises as told by your health care provider, physical therapist, or occupational therapist. Keep all follow-up visits. This is important. Contact a health care provider if: You have new symptoms. Your pain is not controlled with medicines. Your symptoms get worse. Get help right away if: You have severe numbness or tingling in your wrist or hand. Summary Carpal tunnel syndrome is a condition that causes pain, numbness, and weakness in your hand and fingers. It is usually caused by repeated wrist  motions. Lifestyle changes and medicines are used to treat carpal tunnel syndrome. Surgery may be recommended. Follow your health care provider's instructions about wearing a splint, resting from activity, keeping follow-up visits, and calling for help. This information is not intended to replace advice given to you by your health care provider. Make sure you discuss any questions you have with your health care provider. Document Revised: 11/06/2019 Document Reviewed: 11/06/2019 Elsevier Patient Education  2024 [employer].

## 2023-05-08 NOTE — Progress Notes (Signed)
Subjective:    Patient ID: Kelly Reed, female    DOB: 1966/03/11, 57 y.o.   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  Tingling/numbness in finger intermittent and posterior right hand/wrist swelling noted end of work day/evenings.  Using tylenol and compression gloves but worsening.  Right hand dominant  GERD worsening also has been losing weight due to decreased po intake with GERD     Review of Systems  Constitutional:  Negative for chills and fever.  HENT:  Negative for trouble swallowing and voice change.   Eyes:  Negative for photophobia and visual disturbance.  Respiratory:  Negative for cough.   Gastrointestinal:  Negative for diarrhea, nausea and vomiting.  Genitourinary:  Negative for difficulty urinating.  Musculoskeletal:  Positive for arthralgias, joint swelling and myalgias. Negative for gait problem, neck pain and neck stiffness.  Skin:  Negative for color change, pallor, rash and wound.  Allergic/Immunologic: Positive for environmental allergies.  Neurological:  Positive for numbness. Negative for tremors, seizures, syncope, speech difficulty and weakness.  Hematological:  Does not bruise/bleed easily.  Psychiatric/Behavioral:  Negative for agitation, confusion and sleep disturbance.        Objective:   Physical Exam Vitals reviewed.  Constitutional:      General: She is awake. She is not in acute distress.    Appearance: Normal appearance. She is well-developed and well-groomed. She is obese. She is not ill-appearing, toxic-appearing or diaphoretic.  HENT:     Head: Normocephalic and atraumatic.     Jaw: There is normal jaw occlusion.     Salivary Glands: Right salivary gland is not diffusely enlarged. Left salivary gland is not diffusely enlarged.     Right Ear: Hearing and external ear normal.      Left Ear: Hearing and external ear normal.     Nose: Nose normal. No congestion or rhinorrhea.     Mouth/Throat:     Lips: Pink. No lesions.     Mouth: Mucous membranes are Reed.     Pharynx: Oropharynx is clear.  Eyes:     General: Lids are normal. Vision grossly intact. Gaze aligned appropriately. No scleral icterus.       Right eye: No discharge.        Left eye: No discharge.     Extraocular Movements: Extraocular movements intact.     Conjunctiva/sclera: Conjunctivae normal.     Pupils: Pupils are equal, round, and reactive to light.  Neck:     Trachea: Trachea and phonation normal.  Cardiovascular:     Rate and Rhythm: Normal rate and regular rhythm.     Pulses: Normal pulses.          Radial pulses are 2+ on the right side and 2+ on the left side.     Heart sounds: Normal heart sounds.  Pulmonary:     Effort: Pulmonary effort is normal.     Breath sounds: Normal breath sounds and air entry. No stridor or transmitted upper airway sounds. No wheezing.     Comments: Spoke full sentences without difficulty; no cough observed in exam room Abdominal:     General: Abdomen is flat. There is no distension.     Palpations: Abdomen is soft.     Tenderness: There is no guarding.  Musculoskeletal:        General: Tenderness present. No swelling or  deformity. Normal range of motion.     Right elbow: No swelling, deformity, effusion or lacerations. Normal range of motion.     Left elbow: No swelling, deformity, effusion or lacerations. Normal range of motion.     Right forearm: No swelling, edema, deformity, lacerations, tenderness or bony tenderness.     Left forearm: No swelling, edema, deformity, lacerations, tenderness or bony tenderness.     Right wrist: Tenderness present. No swelling, deformity, effusion, lacerations, bony tenderness, snuff box tenderness or crepitus. Normal range of motion.     Left wrist: No swelling, deformity, effusion, lacerations, tenderness, bony  tenderness, snuff box tenderness or crepitus. Normal range of motion.     Right hand: No swelling, deformity, lacerations or tenderness. Normal range of motion. Normal strength. Normal capillary refill.     Left hand: No swelling, deformity, lacerations or tenderness. Normal range of motion. Normal strength. Normal capillary refill.     Cervical back: Normal range of motion and neck supple. No edema, erythema, signs of trauma, rigidity, torticollis or crepitus. No pain with movement. Normal range of motion.     Right lower leg: No edema.     Left lower leg: No edema.     Comments: Wearing copper compression gloves under nitrile canvas gloves for work; removed for exam today; negative tinel's and finkelsteins test bilaterally; positive phalens right into 2nd and 3rd digits at 15 seconds; bilateral fingers normal AROM and strenth ad/abduction/flexion/extension without crepitus/locking/popping; pain with wrist flexion bilaterally  Lymphadenopathy:     Head:     Right side of head: No submandibular or preauricular adenopathy.     Left side of head: No submandibular or preauricular adenopathy.     Cervical: No cervical adenopathy.     Right cervical: No superficial cervical adenopathy.    Left cervical: No superficial cervical adenopathy.  Skin:    General: Skin is warm and dry.     Capillary Refill: Capillary refill takes less than 2 seconds.     Coloration: Skin is not ashen, cyanotic, jaundiced, mottled, pale or sallow.     Findings: No abrasion, abscess, acne, bruising, burn, ecchymosis, erythema, signs of injury, laceration, lesion, petechiae, rash or wound.     Nails: There is no clubbing.  Neurological:     General: No focal deficit present.     Mental Status: She is alert and oriented to person, place, and time. Mental status is at baseline.     GCS: GCS eye subscore is 4. GCS verbal subscore is 5. GCS motor subscore is 6.     Cranial Nerves: No cranial nerve deficit, dysarthria or facial  asymmetry.     Sensory: Sensation is intact.     Motor: Motor function is intact. No weakness, tremor, atrophy, abnormal muscle tone or seizure activity.     Coordination: Coordination is intact. Coordination normal.     Gait: Gait is intact. Gait normal.     Comments: In/out of chair without difficulty; gait sure and steady in clinic; bilateral hand grasp equal 5/5  Psychiatric:        Attention and Perception: Attention and perception normal.        Mood and Affect: Mood and affect normal.        Speech: Speech normal.        Behavior: Behavior normal. Behavior is cooperative.        Thought Content: Thought content normal.        Cognition and Memory: Cognition and memory normal.  Judgment: Judgment normal.           Assessment & Plan:  A-acute wrist pain right  P-Fitted and distributed right wrist splint medium/large from clinic stock, Given one wrist compression sleeve from clinic stock. Discussed may hand wash and air dry or use blowdryer but do not place in drying machine for clothes as can melt plastic.  biofreeze gel apply QID prn pain  given 4 UD tylenol 1000mg  po QID prn pain every 6 hours.  Discussed mild phalen's positive right wear splint at night   Patient was instructed to rest, ice and elevate arms if swelling noted after work.  Cryotherapy 15 minutes TID prn pain/swelling. Exitcare handout on wrist  pain  and carpal tunnel printed and given to patient. Medications as directed. Suspect overuse soft tissue injury related pain due to repetitive motion/float to packing was improving until heavy lifting yesterday. Call or return to clinic as needed if these symptoms worsen  or fail to improve as anticipated and will consider PT and orthopedics evaluation.  Next follow up evaluation to be scheduled in 2 weeks.  Discussed if unable to perform work duties will need to be scheduled with [facility] provider as unable to write work restrictions in this clinic due to contract  limitations.  Avoid starting new activities that utilize hands in the next two weeks and then only 15 minutes per day after that time of rest.  Patient verbalized agreement and understanding of treatment plan and had no further questions at this time. P2: ROM, injury prevention

## 2023-05-15 ENCOUNTER — Ambulatory Visit: Payer: No Typology Code available for payment source | Admitting: Registered Nurse

## 2023-05-15 ENCOUNTER — Encounter: Payer: Self-pay | Admitting: Registered Nurse

## 2023-05-15 VITALS — BP 147/92 | HR 98 | Temp 99.2°F | Resp 16

## 2023-05-15 DIAGNOSIS — J019 Acute sinusitis, unspecified: Secondary | ICD-10-CM

## 2023-05-15 DIAGNOSIS — H6993 Unspecified Eustachian tube disorder, bilateral: Secondary | ICD-10-CM

## 2023-05-15 NOTE — Patient Instructions (Signed)
How to Perform a Sinus Rinse A sinus rinse is a home treatment that is used to rinse your sinuses with a germ-free (sterile) mixture of salt and water (saline solution). Sinuses are air-filled spaces in your skull that are behind the bones of your face and forehead. They open into your nasal cavity. A sinus rinse can help to clear mucus, dirt, dust, or pollen from your nasal cavity. You may do a sinus rinse when you have a cold, a virus, nasal allergy symptoms, a sinus infection, or stuffiness in your nose or sinuses. What are the risks? A sinus rinse is generally safe and effective. However, there are a few risks, which include: A burning sensation in your sinuses. This may happen if you do not make the saline solution as directed. Be sure to follow all directions when making the saline solution. Nasal irritation. Infection. This may be from unclean supplies or from contaminated water. Infection from contaminated water is rare, but possible. Do not do a sinus rinse if you have had ear or nasal surgery, ear infection, or plugged ears, unless recommended by your health care provider. Supplies needed: Saline solution or powder. Distilled or sterile water to mix with saline powder. You may use boiled and cooled tap water. Boil tap water for 5 minutes; cool until it is lukewarm. Use within 24 hours. Do not use regular tap water to mix with the saline solution. Neti pot or nasal rinse bottle. These supplies release the saline solution into your nose and through your sinuses. Neti pots and nasal rinse bottles can be purchased at [employer], a health food store, or online. How to perform a sinus rinse  Wash your hands with soap and water for at least 20 seconds. If soap and water are not available, use hand sanitizer. Wash your device according to the directions that came with the product and then dry it. Use the solution that comes with your product or one that is sold separately in stores.  Follow the mixing directions on the package to mix with sterile or distilled water. Fill the device with the amount of saline solution noted in the device instructions. Stand by a sink and tilt your head sideways over the sink. Place the spout of the device in your upper nostril (the one closer to the ceiling). Gently pour or squeeze the saline solution into your nasal cavity. The liquid should drain out from the lower nostril if you are not too congested. While rinsing, breathe through your open mouth. Gently blow your nose to clear any mucus and rinse solution. Blowing too hard may cause ear pain. Turn your head in the other direction and repeat in your other nostril. Clean and rinse your device with clean water and then air-dry it. Talk with your health care provider or pharmacist if you have questions about how to do a sinus rinse. Summary A sinus rinse is a home treatment that is used to rinse your sinuses with a sterile mixture of salt and water (saline solution). You may do a sinus rinse when you have a cold, a virus, nasal allergy symptoms, a sinus infection, or stuffiness in your nose or sinuses. A sinus rinse is generally safe and effective. Follow all instructions carefully. This information is not intended to replace advice given to you by your health care provider. Make sure you discuss any questions you have with your health care provider. Document Revised: 12/13/2020 Document Reviewed: 12/13/2020 Elsevier Patient Education  2024 [employer] Nonallergic  Rhinitis Nonallergic rhinitis is inflammation of the mucous membrane inside the nose. The mucous membrane is the tissue that produces mucus. This condition is different from having allergic rhinitis, which is an allergy that affects the nose. Allergic rhinitis occurs when the body's defense system, or immune system, reacts to a substance that a person is allergic to (allergen), such as pollen, pet dander, mold, or dust. Nonallergic  rhinitis has many similar symptoms, but it is not caused by allergens. Nonallergic rhinitis can be an acute or chronic problem. This means it can be short-term or long-term. What are the causes? This condition may be caused by many different things. Some common types of nonallergic rhinitis include: Infectious rhinitis. This is usually caused by an infection in the nose, throat, or upper airways (upper respiratory system). Vasomotor rhinitis. This is the most common type. It is caused by too much blood flow through your nose, and makes your nose swell. It is triggered by strong odors, cold air, stress, drinking alcohol, cigarette smoke, or changes in the weather. Occupational rhinitis. This type is caused by triggers in the workplace, such as chemicals, dust, animal dander, or air pollution. Hormonal rhinitis, in female teens and adults. This type is caused by an increase in the hormone estrogen and may happen during pregnancy, puberty, or monthly menstrual periods. Hormonal rhinitis gives you fewer symptoms when estrogen levels drop. Drug-induced rhinitis. Several types of medicines can cause this, such as medicines for high blood pressure or heart disease, aspirin, or NSAIDs. Nonallergic rhinitis with eosinophilia syndrome (NARES). This type is caused by having too much eosinophil, a type of white blood cell. Other causes include a reaction to eating hot or spicy foods. This does not usually cause long-term symptoms. In some cases, the cause of nonallergic rhinitis is not known. What increases the risk? You are more likely to develop this condition if: You are 72-57 years of age. You are female. People who are female are twice as likely to have this condition. What are the signs or symptoms? Common symptoms of this condition include: Stuffy nose (nasal congestion). Runny nose. A feeling of mucus dripping down the back of your throat (postnasal drip). Trouble sleeping. Tiredness, or  fatigue. Other symptoms include: Sneezing. Coughing. Itchy nose. Bloodshot eyes. How is this diagnosed? This type may be diagnosed based on: Your symptoms and medical history. A physical exam. Allergy testing to rule out allergic rhinitis. You may have skin tests or blood tests. Your health care provider may also take a swab of nasal discharge to look for an increased number of eosinophils. This is done to confirm a diagnosis of NARES. How is this treated? Treatment for this condition depends on the cause. No single treatment works for everyone. Work with your provider to find the best treatment for you. Treatment may include: Avoiding the things that trigger your symptoms. Medicines to relieve congestion, such as: Steroid nasal spray. There are many types. You may need to try a few to find out which one works best. Engineer, civil (consulting) medicine. This treats nasal congestion and may be given by mouth or as a nasal spray. These medicines are used only for a short time. Medicines to relieve a runny nose. These may include antihistamine medicines or decongestant nasal sprays. Nasal irrigation. This involves using a salt-water (saline) spray or saline container called a neti pot. Nasal irrigation helps to clear away mucus and keep your nasal passages moist. Surgery to remove part of your mucous membrane. This is done in  severe cases if the condition has not improved after 6-12 months of treatment. Follow these instructions at home: Medicines Take or use over-the-counter and prescription medicines only as told by your provider. Do not stop using your medicine even if you start to feel better. Do not take NSAIDs, such as ibuprofen, or medicines that contain aspirin if they make your symptoms worse. Lifestyle Do not drink alcohol if it makes your symptoms worse. Do not use any products that contain nicotine or tobacco. These products include cigarettes, chewing tobacco, and vaping devices, such as  e-cigarettes. If you need help quitting, ask your provider. Avoid secondhand smoke. General instructions Avoid triggers that make your symptoms worse. Use nasal irrigation as told by your provider. Sleep with the head of your bed raised. This may reduce nasal congestion when you sleep. Drink enough fluid to keep your pee (urine) pale yellow. Contact a health care provider if: You have a fever. Your symptoms are getting worse at home. Your symptoms do not lessen with medicine. You develop new symptoms, especially a headache or nosebleed. Get help right away if: You have difficulty breathing. This symptom may be an emergency. Get help right away. Call 911. Do not wait to see if the symptoms will go away. Do not drive yourself to the hospital. This information is not intended to replace advice given to you by your health care provider. Make sure you discuss any questions you have with your health care provider. Document Revised: 02/28/2022 Document Reviewed: 02/28/2022 Elsevier Patient Education  2024 [employer] Eustachian Tube Dysfunction  Eustachian tube dysfunction refers to a condition in which a blockage develops in the narrow passage that connects the middle ear to the back of the nose (eustachian tube). The eustachian tube regulates air pressure in the middle ear by letting air move between the ear and nose. It also helps to drain fluid from the middle ear space. Eustachian tube dysfunction can affect one or both ears. When the eustachian tube does not function properly, air pressure, fluid, or both can build up in the middle ear. What are the causes? This condition occurs when the eustachian tube becomes blocked or cannot open normally. Common causes of this condition include: Ear infections. Colds and other infections that affect the nose, mouth, and throat (upper respiratory tract). Allergies. Irritation from cigarette smoke. Irritation from stomach acid coming up into the  esophagus (gastroesophageal reflux). The esophagus is the part of the body that moves food from the mouth to the stomach. Sudden changes in air pressure, such as from descending in an airplane or scuba diving. Abnormal growths in the nose or throat, such as: Growths that line the nose (nasal polyps). Abnormal growth of cells (tumors). Enlarged tissue at the back of the throat (adenoids). What increases the risk? You are more likely to develop this condition if: You smoke. You are overweight. You are a child who has: Certain birth defects of the mouth, such as cleft palate. Large tonsils or adenoids. What are the signs or symptoms? Common symptoms of this condition include: A feeling of fullness in the ear. Ear pain. Clicking or popping noises in the ear. Ringing in the ear (tinnitus). Hearing loss. Loss of balance. Dizziness. Symptoms may get worse when the air pressure around you changes, such as when you travel to an area of high elevation, fly on an airplane, or go scuba diving. How is this diagnosed? This condition may be diagnosed based on: Your symptoms. A physical exam of your  ears, nose, and throat. Tests, such as those that measure: The movement of your eardrum. Your hearing (audiometry). How is this treated? Treatment depends on the cause and severity of your condition. In mild cases, you may relieve your symptoms by moving air into your ears. This is called "popping the ears." In more severe cases, or if you have symptoms of fluid in your ears, treatment may include: Medicines to relieve congestion (decongestants). Medicines that treat allergies (antihistamines). Nasal sprays or ear drops that contain medicines that reduce swelling (steroids). A procedure to drain the fluid in your eardrum. In this procedure, a small tube may be placed in the eardrum to: Drain the fluid. Restore the air in the middle ear space. A procedure to insert a balloon device through the nose  to inflate the opening of the eustachian tube (balloon dilation). Follow these instructions at home: Lifestyle Do not do any of the following until your health care provider approves: Travel to high altitudes. Fly in airplanes. Work in a Estate agent or room. Scuba dive. Do not use any products that contain nicotine or tobacco. These products include cigarettes, chewing tobacco, and vaping devices, such as e-cigarettes. If you need help quitting, ask your health care provider. Keep your ears dry. Wear fitted earplugs during showering and bathing. Dry your ears completely after. General instructions Take over-the-counter and prescription medicines only as told by your health care provider. Use techniques to help pop your ears as recommended by your health care provider. These may include: Chewing gum. Yawning. Frequent, forceful swallowing. Closing your mouth, holding your nose closed, and gently blowing as if you are trying to blow air out of your nose. Keep all follow-up visits. This is important. Contact a health care provider if: Your symptoms do not go away after treatment. Your symptoms come back after treatment. You are unable to pop your ears. You have: A fever. Pain in your ear. Pain in your head or neck. Fluid draining from your ear. Your hearing suddenly changes. You become very dizzy. You lose your balance. Get help right away if: You have a sudden, severe increase in any of your symptoms. Summary Eustachian tube dysfunction refers to a condition in which a blockage develops in the eustachian tube. It can be caused by ear infections, allergies, inhaled irritants, or abnormal growths in the nose or throat. Symptoms may include ear pain or fullness, hearing loss, or ringing in the ears. Mild cases are treated with techniques to unblock the ears, such as yawning or chewing gum. More severe cases are treated with medicines or procedures. This information is not  intended to replace advice given to you by your health care provider. Make sure you discuss any questions you have with your health care provider. Document Revised: 09/06/2020 Document Reviewed: 09/06/2020 Elsevier Patient Education  2024 [employer].

## 2023-05-15 NOTE — Progress Notes (Signed)
Subjective:    Patient ID: Kelly Reed, female    DOB: Nov 24, 1965, 57 y.o.   MRN: [id number]  57y/o married caucasian established female here for evaluation right ear pain 3/10 now took tylenol this am and sinus congestion started overnight.   After sneezing congestion improves.   Compression right wrist helping.  Wearing wrist splint bedtime sometimes and helping with symptoms but clunky to sleep with so hasn't been wearing every night      Review of Systems  Constitutional:  Positive for fatigue. Negative for chills, diaphoresis and fever.  HENT:  Positive for congestion, ear pain, postnasal drip, rhinorrhea, sinus pressure, sinus pain and sneezing. Negative for ear discharge, facial swelling, sore throat, tinnitus, trouble swallowing and voice change.   Eyes:  Negative for photophobia, pain, discharge, redness, itching and visual disturbance.  Respiratory:  Negative for cough, choking, chest tightness, shortness of breath, wheezing and stridor.   Cardiovascular:  Negative for chest pain.  Gastrointestinal:  Negative for diarrhea, nausea and vomiting.  Genitourinary:  Negative for difficulty urinating.  Musculoskeletal:  Positive for arthralgias and myalgias. Negative for gait problem, neck pain and neck stiffness.  Skin:  Negative for color change, pallor, rash and wound.  Allergic/Immunologic: Positive for environmental allergies.  Neurological:  Positive for numbness and headaches. Negative for dizziness, tremors, seizures, syncope, facial asymmetry, speech difficulty, weakness and light-headedness.  Hematological:  Negative for adenopathy.  Psychiatric/Behavioral:  Negative for agitation, confusion and sleep disturbance.        Objective:   Physical Exam Vitals and nursing note reviewed.  Constitutional:      General: She is awake. She is not in acute distress.    Appearance: Normal appearance. She is well-developed, well-groomed and overweight. She is not ill-appearing,  toxic-appearing or diaphoretic.  HENT:     Head: Normocephalic and atraumatic.     Jaw: There is normal jaw occlusion.     Salivary Glands: Right salivary gland is not diffusely enlarged or tender. Left salivary gland is not diffusely enlarged or tender.     Right Ear: Hearing, ear canal and external ear normal. No decreased hearing noted. No laceration, drainage, swelling or tenderness. A middle ear effusion is present. There is no impacted cerumen. No foreign body. No mastoid tenderness. No PE tube. No hemotympanum. Tympanic membrane is bulging. Tympanic membrane is not injected, scarred, perforated, erythematous or retracted.     Left Ear: Hearing, ear canal and external ear normal. No decreased hearing noted. No laceration, drainage, swelling or tenderness. A middle ear effusion is present. There is no impacted cerumen. No foreign body. No mastoid tenderness. No PE tube. No hemotympanum. Tympanic membrane is bulging. Tympanic membrane is not injected, scarred, perforated, erythematous or retracted.     Ears:     Comments: Bilateral TMs intact air fluid level clear no debris noted in auditory canals    Nose: Congestion and rhinorrhea present. Rhinorrhea is clear.     Right Turbinates: Enlarged and swollen. Not pale.     Left Turbinates: Enlarged and swollen. Not pale.     Right Sinus: Maxillary sinus tenderness and frontal sinus tenderness present.     Left Sinus: Maxillary sinus tenderness and frontal sinus tenderness present.     Mouth/Throat:     Lips: Pink. No lesions.     Mouth: Mucous membranes are normal. Mucous membranes are moist. No oral lesions or angioedema.     Dentition: No gum lesions.     Tongue: No lesions. Tongue  does not deviate from midline.     Palate: No mass and lesions.     Pharynx: Uvula midline. Pharyngeal swelling, posterior oropharyngeal edema, posterior oropharyngeal erythema and postnasal drip present. No oropharyngeal exudate or uvula swelling.     Tonsils: No  tonsillar exudate or tonsillar abscesses.     Comments: Cobblestoning posterior pharynx; bilateral allergic rhinitis; erythema oropharynx; nasal sniffing observed in clinic Eyes:     General: Lids are normal. Vision grossly intact. Gaze aligned appropriately. Allergic shiner present. No scleral icterus.       Right eye: No discharge.        Left eye: No discharge.     Extraocular Movements: Extraocular movements intact and EOM normal.     Conjunctiva/sclera: Conjunctivae normal.     Right eye: Right conjunctiva is not injected. No chemosis, exudate or hemorrhage.    Left eye: Left conjunctiva is not injected. No chemosis, exudate or hemorrhage.    Pupils: Pupils are equal, round, and reactive to light.  Neck:     Trachea: Trachea and phonation normal. No abnormal tracheal secretions.  Cardiovascular:     Rate and Rhythm: Normal rate and regular rhythm.     Pulses:          Radial pulses are 2+ on the right side and 2+ on the left side.     Heart sounds: Normal heart sounds, S1 normal and S2 normal.  Pulmonary:     Effort: Pulmonary effort is normal.     Breath sounds: Normal breath sounds and air entry. No stridor, decreased air movement or transmitted upper airway sounds. No decreased breath sounds, wheezing or rhonchi.     Comments: Spoke full sentences without difficulty; no cough observed in exam room Abdominal:     General: Abdomen is flat. Bowel sounds are normal.     Palpations: Abdomen is soft.  Musculoskeletal:        General: Normal range of motion.     Right wrist: No swelling, deformity, effusion, lacerations or crepitus. Normal range of motion.     Left wrist: No swelling, deformity, effusion, lacerations or crepitus. Normal range of motion.     Right hand: No swelling, deformity or lacerations. Normal range of motion. Normal strength. Normal capillary refill.     Left hand: No swelling, deformity or lacerations. Normal range of motion. Normal strength. Normal capillary  refill.     Cervical back: Normal range of motion and neck supple. No swelling, edema, deformity, erythema, signs of trauma, lacerations, rigidity, spasms, torticollis, tenderness, bony tenderness or crepitus. No pain with movement or muscular tenderness. Normal range of motion.     Thoracic back: No swelling, edema, deformity, signs of trauma, lacerations, spasms or tenderness. Normal range of motion. No scoliosis.     Right lower leg: No swelling.     Left lower leg: No swelling.     Comments: Wearing wrist compression right wrist; full arom fingers/wrist/elbow bilaterally and strength 5/5  Lymphadenopathy:     Head:     Right side of head: No submandibular or preauricular adenopathy.     Left side of head: No submandibular or preauricular adenopathy.     Cervical:     Right cervical: No superficial, deep or posterior cervical adenopathy.    Left cervical: No superficial, deep or posterior cervical adenopathy.  Skin:    General: Skin is warm, dry and intact.     Capillary Refill: Capillary refill takes less than 2 seconds.  Coloration: Skin is not ashen, cyanotic, jaundiced, mottled, pale or sallow.     Findings: No abrasion, abscess, acne, bruising, burn, ecchymosis, erythema, signs of injury, laceration, lesion, petechiae, rash or wound.     Nails: There is no clubbing.     Comments: Face/neck/forearms/ankles evaluated  Neurological:     General: No focal deficit present.     Mental Status: She is alert and oriented to person, place, and time. Mental status is at baseline.     GCS: GCS eye subscore is 4. GCS verbal subscore is 5. GCS motor subscore is 6.     Cranial Nerves: Cranial nerves 2-12 are intact. No cranial nerve deficit, dysarthria or facial asymmetry.     Sensory: Sensation is intact.     Motor: Motor function is intact. No weakness, tremor, atrophy, abnormal muscle tone or seizure activity.     Coordination: Coordination is intact. Coordination normal.     Gait: Gait is  intact. Gait normal.     Comments: In/out of chair without difficulty; gait sure and steady in clinic; bilateral hand grasp equal 5/5  Psychiatric:        Attention and Perception: Attention and perception normal.        Mood and Affect: Mood and affect, mood and affect normal.        Speech: Speech normal.        Behavior: Behavior normal. Behavior is cooperative.        Thought Content: Thought content normal.        Cognition and Memory: Cognition, memory and cognition and memory normal.        Judgment: Judgment normal.           Assessment & Plan:  A-eustachian tube dysfunction bilateral, acute rhinosinusitis  P- No evidence of invasive bacterial infection, non toxic and well hydrated.  I do not see where any further testing or imaging is necessary at this time.   I will suggest supportive care, rest, good hygiene and encourage the patient to take adequate fluids.  The patient is to return to clinic or EMERGENCY ROOM if symptoms worsen or change significantly e.g. ear pain, fever, purulent discharge from ears or bleeding.  Exitcare handout on otitis media and eustachian tube dysfunction printed and given to patient.  Discussed with patient post nasal drip irritates throat/causes swelling blocks eustachian tubes from draining and fluid fills up middle ear.  Bacteria/viruses can grow in fluid and with moving head tube compressed and increases pressure in tube/ear worsening pain.  Studies show will take 30 days for fluid to resolve after post nasal drip controlled with nasal steroid/antihistamine. Antibiotics and steroids do not speed up fluid removal.  Patient verbalized agreement and understanding of treatment plan and had no further questions at this time.   Discussed maximum dose tylenol 1000mg  every 6 hours or 4000mg  per 24 hours to prevent liver damage.  Phenylephrine 10mg  po q6h prn congestion given 4 UD from clinic stock.  Continue her allergy medication regimen.  Given 1 bottle nasal  saline from clinic stock use after dust exposure at work prn 2 sprays each nostril q2h wa.  Patient may use normal saline nasal spray 2 sprays each nostril q2h wa as needed. flonase 1 spray each nostril BID. Discussed post nasal drip observed on exam could be allergy versus viral Patient denied personal or family history of ENT cancer.  OTC antihistamine of choice allegra 180mg  po daily.  Avoid triggers if possible.  Shower prior  to bedtime if exposed to triggers.  If allergic dust/dust mites recommend mattress/pillow covers/encasements; washing linens, vacuuming, sweeping, dusting weekly.  Call or return to clinic as needed if these symptoms worsen or fail to improve as anticipated.   Exitcare handout on nonallergic rhinitis and sinus.  Patient verbalized understanding of instructions, agreed with plan of care and had no further questions at this time.  P2:  Avoidance and hand washing.

## 2023-06-25 ENCOUNTER — Encounter: Payer: Self-pay | Admitting: Registered Nurse

## 2023-06-25 DIAGNOSIS — R509 Fever, unspecified: Secondary | ICD-10-CM

## 2023-06-25 DIAGNOSIS — R110 Nausea: Secondary | ICD-10-CM

## 2023-06-26 NOTE — Telephone Encounter (Signed)
Spoke with patient via telephone still having congestion, body aches, cough mostly nonproductive and fatigue.  Denied fever/chills/n/v/d today.  Cleared to return to work tomorrow.  Discussed use normal call out procedures if unable to return or fever greater than 100.21F, vomiting/diarrhea in past 24 hours prior to start of shift.  HR and supervisor/manager notified of excused absence for today.  Patient hoarse voice A&Ox3 dry throat clearing cough during 3 minutes call and nasal sniffing/congestion audible.  Discussed hydrate with water to keep urine pale clear and voiding every 2-4 hours; nasal saline 2 sprays each nostril q2h wa prn congestion; fluticasone nasal 1 spray each nostril BID, may continue OTC tylenol cold per manufacturer instructions.  Patient stated not feeling congested in chest/wheezing.  Discussed viral illness in community typically lasting 7-10 days rhinitis/congestion/cough.  Burna Mortimer in clinic 8-5 tomorrow and Thursday and I am in clinic Thursday 11-2.  Patient verbalized understanding information/instructions, agreed with plan of care and had no further questions at this time.

## 2023-06-27 ENCOUNTER — Ambulatory Visit: Payer: No Typology Code available for payment source

## 2023-06-27 ENCOUNTER — Encounter: Payer: Self-pay | Admitting: Internal Medicine

## 2023-06-27 NOTE — Telephone Encounter (Signed)
Patient returned to work today having some nausea and feeling hot instructed to see RN Burna Mortimer for temp check.  Patient given tums/peptobismol by RN Burna Mortimer for prn use today.  Discussed bland diet stay away from large portions meat/fried/spicy until stomach issues resolve.  Patient flushed skin but afebrile on check with RN Burna Mortimer today.  Denied vomiting/diarrhea/chills but is fatigued  discussed with patient continue plan of care as previously discussed  HR notified returned to work as expected  return to clinic for re evaluation if new or worsening symptoms  A&Ox3 respirations even and unlabored gait sure and steady in clinic    discussed I am onsite 11-2 tomorrow and RN Burna Mortimer 8-5  Patient verbalized understanding information/instructions and had no further questions at this time.

## 2023-06-27 NOTE — Telephone Encounter (Signed)
 Care team updated and letter sent for eye exam notes.

## 2023-06-27 NOTE — Progress Notes (Signed)
EE in clinic this morning to check temperature.  Was out sick with fever both Monday and Tuesday fo this week.  Feeling some better today but has c/o nausea and feeling very tired.  Temp. 98.0 now.  Given Diotame tabs.for the nausea and advised bland diet today.  Reece Packer, RN, BSN, MPH, [facility].

## 2023-07-01 NOTE — Telephone Encounter (Signed)
Patient seen in workcenter 06/28/23 feeling better gait sure and steady skin warm dry and pink respirations even and unlabored RA A&Ox3

## 2023-07-03 ENCOUNTER — Other Ambulatory Visit: Payer: Self-pay | Admitting: Internal Medicine

## 2023-07-03 DIAGNOSIS — E1165 Type 2 diabetes mellitus with hyperglycemia: Secondary | ICD-10-CM

## 2023-07-09 ENCOUNTER — Encounter: Payer: Self-pay | Admitting: Registered Nurse

## 2023-07-09 DIAGNOSIS — J111 Influenza due to unidentified influenza virus with other respiratory manifestations: Secondary | ICD-10-CM

## 2023-07-09 MED ORDER — OSELTAMIVIR PHOSPHATE 75 MG PO CAPS: 75.0000 mg | ORAL_CAPSULE | Freq: Two times a day (BID) | ORAL | 0 refills | Status: AC

## 2023-07-10 ENCOUNTER — Telehealth: Payer: Self-pay | Admitting: Registered Nurse

## 2023-07-10 ENCOUNTER — Encounter: Payer: Self-pay | Admitting: Registered Nurse

## 2023-07-10 DIAGNOSIS — J201 Acute bronchitis due to Hemophilus influenzae: Secondary | ICD-10-CM

## 2023-07-10 MED ORDER — SALINE SPRAY 0.65 % NA SOLN: 2.0000 | NASAL | Status: DC

## 2023-07-10 MED ORDER — PROMETHAZINE-DM 6.25-15 MG/5ML PO SYRP: 5.0000 mL | ORAL_SOLUTION | Freq: Four times a day (QID) | ORAL | 0 refills | Status: AC | PRN

## 2023-07-10 MED ORDER — FLUTICASONE PROPIONATE 50 MCG/ACT NA SUSP: 1.0000 | Freq: Two times a day (BID) | NASAL | 6 refills | Status: AC

## 2023-07-10 NOTE — Telephone Encounter (Signed)
 Patient reported can't sleep at night due to cough worsening fatigue would like something stronger to help with cough as inhaler not helping bedtime nor is honey.  Patient has used promethazine  DM in the past refilled for patient 5ml po q6h prn cough #120 RF0 electronic rx sent to her pharmacy of choice discussed causes drowsiness avoid alcohol and driving after taking.  Re-evaluation tomorrow for possible RTW Thursday 12 Jul 2023.  HR and supervisor notified re-evaluation tomorrow.  Patient A&Ox3 spoke full sentences without difficulty sounds fatigued during 5 minute call no audible congestion or throat clearing dry cough intermittent  Patient agreed with plan of care and had no further questions at this time.

## 2023-07-11 NOTE — Telephone Encounter (Signed)
 Patient stated still having headache and body aches repeat covid test today negative.  Discussed with patient to continue hydration and avoid skipping meals.  Discussed flu rates in the area have been increasing over the past week and most likely that is what she has.  Excused absence extended 24 hours and re-evaluation tomorrow.  Patient voice with low energy spoke full sentences without difficulty no wheezing audible during 2 minute call.  Denied stomach upset or fever today.  Patient verbalized understanding information/instructions and had no further questions at this time.  HR and supervisor notified 24 hours extended excused absence per communicable disease policy.

## 2023-07-12 NOTE — Telephone Encounter (Signed)
 See tcon dated 07/09/23

## 2023-07-12 NOTE — Telephone Encounter (Signed)
 Patient still fatigued, feeling weak, nasal congestion, sour stomach but denied vomiting/diarrhea/dizziness. Last episode diarrhea yesterday.  Tolerating po intake without difficulty today.  Voice hoarse  No audible cough or wheezing during 3 minute call  Patient does not feel well enough to make it through entire shift at work.  Still some hot/cold flashes.  Extended 24 hours and re-evaluation tomorrow.  HR and supervisor notified.  Patient agreed with plan of care and had no further questions at this time.

## 2023-07-12 NOTE — Telephone Encounter (Signed)
 See tcon dated 07/10/23

## 2023-07-13 NOTE — Telephone Encounter (Signed)
 Patient reported energy level higher today and symptoms improved less fatigue, denied fever/chills/vomiting/diarrhea or nausea.  Patient feels ready to return to work tomorrow if need to work on Saturday in her work section.  HR and supervisor sent message cleared to return onsite.  A&OX3 spoke full sentences without difficulty no audible cough/congestion/throat clearing or hoarse voice during 2 minute call

## 2023-07-17 NOTE — Telephone Encounter (Signed)
 Patient seen in workcenter today returned to work yesterday and stated work went well denied questions/concerns or new/worsening symptoms.  Patient A&Ox3 spoke full sentences without difficulty skin warm dry and pink and gait sure steady

## 2023-07-19 NOTE — Telephone Encounter (Signed)
 Patient stated returned to work Monday 07/16/23 and work went well.  Seen in workcenter 07/17/23 no cough observed voice normal respiration even and unlabored no audible congestion/throat clearing during 5 minute conversation.  Gait sure and steady skin warm dry and pink A&Ox3.

## 2023-07-24 ENCOUNTER — Other Ambulatory Visit: Payer: Self-pay | Admitting: Internal Medicine

## 2023-07-24 DIAGNOSIS — Z1231 Encounter for screening mammogram for malignant neoplasm of breast: Secondary | ICD-10-CM

## 2023-08-03 ENCOUNTER — Ambulatory Visit: Payer: No Typology Code available for payment source | Admitting: Internal Medicine

## 2023-08-03 ENCOUNTER — Encounter: Payer: Self-pay | Admitting: Internal Medicine

## 2023-08-03 VITALS — BP 132/84 | HR 91 | Temp 98.3°F | Ht 71.0 in | Wt 201.0 lb

## 2023-08-03 DIAGNOSIS — E1169 Type 2 diabetes mellitus with other specified complication: Secondary | ICD-10-CM

## 2023-08-03 DIAGNOSIS — R8290 Unspecified abnormal findings in urine: Secondary | ICD-10-CM | POA: Diagnosis not present

## 2023-08-03 DIAGNOSIS — E785 Hyperlipidemia, unspecified: Secondary | ICD-10-CM | POA: Diagnosis not present

## 2023-08-03 DIAGNOSIS — D509 Iron deficiency anemia, unspecified: Secondary | ICD-10-CM | POA: Diagnosis not present

## 2023-08-03 DIAGNOSIS — Z7984 Long term (current) use of oral hypoglycemic drugs: Secondary | ICD-10-CM

## 2023-08-03 DIAGNOSIS — R5383 Other fatigue: Secondary | ICD-10-CM

## 2023-08-03 DIAGNOSIS — E118 Type 2 diabetes mellitus with unspecified complications: Secondary | ICD-10-CM

## 2023-08-03 LAB — POCT GLYCOSYLATED HEMOGLOBIN (HGB A1C): HbA1c POC (<> result, manual entry): 6.9 % (ref 4.0–5.6)

## 2023-08-03 LAB — LIPID PANEL
Cholesterol: 190 mg/dL (ref 0–200)
HDL: 66.8 mg/dL (ref >39.00)
LDL Cholesterol: 98 mg/dL (ref 0–99)
NonHDL: 123.02
Total CHOL/HDL Ratio: 3
Triglycerides: 126 mg/dL (ref 0.0–149.0)
VLDL: 25.2 mg/dL (ref 0.0–40.0)

## 2023-08-03 LAB — CBC
HCT: 41.7 % (ref 36.0–46.0)
Hemoglobin: 14 g/dL (ref 12.0–15.0)
MCHC: 33.6 g/dL (ref 30.0–36.0)
MCV: 88.6 fL (ref 78.0–100.0)
Platelets: 277 10*3/uL (ref 150.0–400.0)
RBC: 4.7 Mil/uL (ref 3.87–5.11)
RDW: 14.3 % (ref 11.5–15.5)
WBC: 6 10*3/uL (ref 4.0–10.5)

## 2023-08-03 LAB — VITAMIN B12: Vitamin B-12: 568 pg/mL (ref 211–911)

## 2023-08-03 LAB — URINALYSIS, ROUTINE W REFLEX MICROSCOPIC
Bilirubin Urine: NEGATIVE
Hgb urine dipstick: NEGATIVE
Ketones, ur: NEGATIVE
Leukocytes,Ua: NEGATIVE
Nitrite: NEGATIVE
RBC / HPF: NONE SEEN (ref 0–?)
Specific Gravity, Urine: <=1.005 — AB (ref 1.000–1.030)
Total Protein, Urine: NEGATIVE
Urine Glucose: NEGATIVE
Urobilinogen, UA: 0.2 (ref 0.0–1.0)
pH: 7 (ref 5.0–8.0)

## 2023-08-03 LAB — VITAMIN D 25 HYDROXY (VIT D DEFICIENCY, FRACTURES): VITD: 34.09 ng/mL (ref 30.00–100.00)

## 2023-08-03 LAB — FERRITIN: Ferritin: 9.3 ng/mL — ABNORMAL LOW (ref 10.0–291.0)

## 2023-08-03 LAB — COMPREHENSIVE METABOLIC PANEL
ALT: 20 U/L (ref 0–35)
AST: 19 U/L (ref 0–37)
Albumin: 4.7 g/dL (ref 3.5–5.2)
Alkaline Phosphatase: 54 U/L (ref 39–117)
BUN: 13 mg/dL (ref 6–23)
CO2: 27 meq/L (ref 19–32)
Calcium: 9.5 mg/dL (ref 8.4–10.5)
Chloride: 101 meq/L (ref 96–112)
Creatinine, Ser: 0.69 mg/dL (ref 0.40–1.20)
GFR: 95.12 mL/min (ref >60.00)
Glucose, Bld: 121 mg/dL — ABNORMAL HIGH (ref 70–99)
Potassium: 4 meq/L (ref 3.5–5.1)
Sodium: 139 meq/L (ref 135–145)
Total Bilirubin: 0.5 mg/dL (ref 0.2–1.2)
Total Protein: 7.4 g/dL (ref 6.0–8.3)

## 2023-08-03 LAB — TSH: TSH: 1.03 u[IU]/mL (ref 0.35–5.50)

## 2023-08-03 MED ORDER — LOSARTAN POTASSIUM 50 MG PO TABS: 50.0000 mg | ORAL_TABLET | Freq: Every day | ORAL | 3 refills | Status: DC

## 2023-08-03 NOTE — Assessment & Plan Note (Signed)
Taking crestor 5 mg twice a week and wants to retest to see if levels are improved. Adjust as needed.

## 2023-08-03 NOTE — Assessment & Plan Note (Signed)
Worsening lately. She did take olly vitamins which helped for 1 month but then she stopped and this returned. Checking vitamin D, TSH, vitamin B12 to assess specific deficiencies.

## 2023-08-03 NOTE — Assessment & Plan Note (Signed)
POC HgA1c at goal 6.9 on metformin 1000 mg BID and rybelsus 14 mg daily. Weight is still decreasing with rybelsus. Keep same. She is taking ARB and statin.

## 2023-08-03 NOTE — Patient Instructions (Addendum)
We will check the labs and the urine today.  The sugars are doing better today at 6.9%.

## 2023-08-03 NOTE — Assessment & Plan Note (Signed)
Checking CBC and ferritin. Adjust as needed.

## 2023-08-03 NOTE — Progress Notes (Signed)
   Subjective:   Patient ID: Kelly Reed, female    DOB: 1966-06-03, 58 y.o.   MRN: [id number]  HPI The patient is a 58 YO female coming in for follow up diabetes as well as sinus problems for some time.  Review of Systems  Constitutional: Negative.   HENT:  Positive for congestion, postnasal drip and sinus pressure.   Eyes: Negative.   Respiratory:  Negative for cough, chest tightness and shortness of breath.   Cardiovascular:  Negative for chest pain, palpitations and leg swelling.  Gastrointestinal:  Negative for abdominal distention, abdominal pain, constipation, diarrhea, nausea and vomiting.  Musculoskeletal: Negative.   Skin: Negative.   Neurological: Negative.   Psychiatric/Behavioral: Negative.      Objective:  Physical Exam Constitutional:      Appearance: She is well-developed.  HENT:     Head: Normocephalic and atraumatic.  Cardiovascular:     Rate and Rhythm: Normal rate and regular rhythm.  Pulmonary:     Effort: Pulmonary effort is normal. No respiratory distress.     Breath sounds: Normal breath sounds. No wheezing or rales.  Abdominal:     General: Bowel sounds are normal. There is no distension.     Palpations: Abdomen is soft.     Tenderness: There is no abdominal tenderness. There is no rebound.  Musculoskeletal:     Cervical back: Normal range of motion.  Skin:    General: Skin is warm and dry.  Neurological:     Mental Status: She is alert and oriented to person, place, and time.     Coordination: Coordination normal.     Vitals:   08/03/23 0805  BP: 132/84  Pulse: 91  Temp: 98.3 F (36.8 C)  TempSrc: Oral  SpO2: 95%  Weight: 201 lb (91.2 kg)  Height: 5\' 11"  (1.803 m)    Assessment & Plan:

## 2023-08-07 ENCOUNTER — Ambulatory Visit: Payer: Self-pay | Admitting: Internal Medicine

## 2023-08-07 ENCOUNTER — Encounter: Payer: Self-pay | Admitting: Registered Nurse

## 2023-08-07 ENCOUNTER — Telehealth: Payer: Self-pay | Admitting: Registered Nurse

## 2023-08-07 ENCOUNTER — Encounter: Payer: Self-pay | Admitting: Internal Medicine

## 2023-08-07 DIAGNOSIS — R790 Abnormal level of blood mineral: Secondary | ICD-10-CM

## 2023-08-07 DIAGNOSIS — R5383 Other fatigue: Secondary | ICD-10-CM

## 2023-08-07 MED ORDER — FERROUS SULFATE ER 50 MG PO TBCR: 1.0000 | EXTENDED_RELEASE_TABLET | ORAL | 2 refills | Status: AC

## 2023-08-07 MED ORDER — VITAMIN D3 50 MCG (2000 UT) PO CAPS: 2000.0000 [IU] | ORAL_CAPSULE | Freq: Every day | ORAL | 2 refills | Status: AC

## 2023-08-07 NOTE — Telephone Encounter (Signed)
Pt reports she was seen for increasingly worsening fatigue, she notes she received lab results on Friday in MyChart and notes her iron was low. Pt is inquiring into about how she should treat this per provider. Advised provider would review, pt agreeable and will await return call. Routing to provider for review.  Copied from CRM [id number]. Topic: Clinical - Lab/Test Results >> Aug 07, 2023  9:39 AM Sim Boast F wrote: Reason for CRM: Patient called and has questions regarding lab results - doesn't know what water in urine means Reason for Disposition  Caller requesting lab results  (Exception: Routine or non-urgent lab result.)  Answer Assessment - Initial Assessment Questions 1. REASON FOR CALL or QUESTION: "What is your reason for calling today?" or "How can I best help you?" or "What question do you have that I can help answer?"     Lab results/recommendations 2. CALLER: Document the source of call. (e.g., laboratory, patient).     Patient  Protocols used: PCP Call - No Triage-A-AH

## 2023-08-07 NOTE — Telephone Encounter (Signed)
Addressed in result note.

## 2023-08-07 NOTE — Telephone Encounter (Signed)
Patient stated she would like to review PCM lab results with me and her PCM instructions as confused to how much iron she is to take and when and why her urine has too much water.  Also concerned her cholesterol still too high on medication to lower it.  Fatigue is bothering her a great deal.  Discussed vitamin D in the normal range but low normal.  Consider taking 2000 units by mouth daily with meal OTC with USP/ISP quality check OTC as may help with fatigue also.  Hct/Hgb improved but ferritin low again since patient stopped iron supplement.  Stated slow iron the only one she could tolerate and would like Rx sent to her pharmacy as OTC makes her nauseous.  Per chart review on 65 FE in 2016 at bedtime po.  Will start with every other day slow release 50 po #30 RF2 Electronic Rx sent to her pharmacy of choice.  Reviewed results in detail with patient and compared to previous results on file.  Discussed LDL improved from 131 this summer and previous of 149 on her twice a week statin.HDL stable 66 and total less than 200 at 190 and triglycerides 126 lowest level in 4 years.  Hgba1c improved from 7.7 to 6.9.  spot glucose slightly elevated but improved from July and March also. discussed with patient specific gravity on urine sample was low and she stated she drinks a lot of water and discussed water intake lowers specific gravity of the urine.  Electrolytes, vitamin B12/D, thyroid/kidney/liver function normal.  No infection or anemia noted on CBC  Patient verbalized understanding information/instructions and will trial iron and notify me/PCM if unable to tolerate  I recommend repeat ferritin level in 3-6 months of consistent iron supplement use.  Keep scheduled follow up appts with PCM.  Patient verbalized understanding information/instructions, agreed with plan of care and had no further questions at this time.   Latest Reference Range & Units 08/04/22 08:16 08/15/22 09:20 09/24/22 09:51 10/03/22 09:15 02/02/23  08:55 08/03/23 08:14 08/03/23 08:30  COMPREHENSIVE METABOLIC PANEL    Rpt ! Rpt ! Rpt !  Rpt !  Sodium 135 - 145 mEq/L   140 138 139  139  Potassium 3.5 - 5.1 mEq/L   4.3 3.8 4.2  4.0  Chloride 96 - 112 mEq/L   102 100 101  101  CO2 19 - 32 mEq/L   23 27 27  27   Glucose 70 - 99 mg/dL   657 (H) 846 (H) 962 (H)  121 (H)  BUN 6 - 23 mg/dL   10 14 12  13   Creatinine 0.40 - 1.20 mg/dL   9.52 8.41 3.24  4.01  Calcium 8.4 - 10.5 mg/dL   9.6 9.4 9.6  9.5  BUN/Creatinine Ratio 9 - 23    14      eGFR >59 mL/min/1.73   98      Alkaline Phosphatase 39 - 117 U/L   89 75 64  54  Albumin 3.5 - 5.2 g/dL   4.7 4.4 4.5  4.7  Albumin/Globulin Ratio 1.2 - 2.2    2.0      AST 0 - 37 U/L   42 (H) 35 39 (H)  19  ALT 0 - 35 U/L   54 (H) 52 (H) 59 (H)  20  Total Protein 6.0 - 8.3 g/dL   7.0 7.2 7.0  7.4  Total Bilirubin 0.2 - 1.2 mg/dL   <0.2 0.4 0.4  0.5  GFR >60.00  mL/min    97.43 91.96  95.12  Pro B Natriuretic peptide (BNP) 0.0 - 100.0 pg/mL    21.0     Total CHOL/HDL Ratio      4  3  Cholesterol 0 - 200 mg/dL     161 (H)  096  HDL Cholesterol >39.00 mg/dL     04.54  09.81  LDL (calc) 0 - 99 mg/dL     191 (H)  98  NonHDL      158.33  123.02  Triglycerides 0.0 - 149.0 mg/dL     478.2  956.2  VLDL 0.0 - 40.0 mg/dL     13.0  86.5  Ferritin 10.0 - 291.0 ng/mL       9.3 (L)  VITD 30.00 - 100.00 ng/mL       34.09  Vitamin B12 211 - 911 pg/mL       568  Globulin, Total 1.5 - 4.5 g/dL   2.3      WBC 4.0 - 78.4 K/uL   6.5  6.2  6.0  RBC 3.87 - 5.11 Mil/uL   4.44  4.74  4.70  Hemoglobin 12.0 - 15.0 g/dL   69.6  29.5  28.4  HCT 36.0 - 46.0 %   39.0  42.1  41.7  MCV 78.0 - 100.0 fl   88  88.7  88.6  MCH 26.6 - 33.0 pg   29.3      MCHC 30.0 - 36.0 g/dL   13.2  44.0  10.2  RDW 11.5 - 15.5 %   13.1  14.3  14.3  Platelets 150.0 - 400.0 K/uL   270  239.0  277.0  POC Glucose 70 - 99 mg/dl  725 !       Hemoglobin A1C -  4.0 - 5.6 % 5.6    7.7 (H) Pend 6.9   HbA1c, POC (prediabetic range)       Pend   HbA1c,  POC (controlled diabetic range)       Pend   TSH 0.35 - 5.50 uIU/mL   0.995    1.03  !: Data is abnormal (H): Data is abnormally high (L): Data is abnormally low Rpt: View report in Results Review for more information   Latest Reference Range & Units 02/13/15 09:33 05/28/15 14:59 03/23/16 08:28 09/21/16 09:28 05/10/17 11:22 10/31/19 16:35 08/03/23 08:30  Ferritin 10.0 - 291.0 ng/mL 12 18 41 68 74 75 9.3 (L)  (L): Data is abnormally low

## 2023-08-07 NOTE — Telephone Encounter (Signed)
Her labs have not been released to you just yet.

## 2023-08-08 ENCOUNTER — Other Ambulatory Visit: Payer: Self-pay | Admitting: Registered Nurse

## 2023-08-08 ENCOUNTER — Ambulatory Visit
Admission: RE | Admit: 2023-08-08 | Discharge: 2023-08-08 | Disposition: A | Discharge status: Home / Self Care | Payer: No Typology Code available for payment source | Source: Ambulatory Visit | Attending: Internal Medicine | Admitting: Internal Medicine

## 2023-08-08 ENCOUNTER — Telehealth: Payer: Self-pay | Admitting: Internal Medicine

## 2023-08-08 DIAGNOSIS — Z1231 Encounter for screening mammogram for malignant neoplasm of breast: Secondary | ICD-10-CM

## 2023-08-08 NOTE — Telephone Encounter (Signed)
Copied from CRM [id number]. Topic: General - Other >> Aug 08, 2023  9:32 AM Almira Coaster wrote: Reason for CRM: Patient is calling to verify if FMLA paper work that was faxed from [employer] was received. They faxed it yesterday 08/07/2023 from the hours of 4:15 pm - 5pm. Please advise patient if it was received [phone].

## 2023-08-09 NOTE — Telephone Encounter (Signed)
I have retrived and will fill out and place inside providers box to sign, This is a continuous FMLA for her asthma

## 2023-08-09 NOTE — Telephone Encounter (Signed)
Copied from CRM [id number]. Topic: General - Other >> Aug 09, 2023 12:13 PM Florestine Avers wrote: Reason for CRM: Patient called in inquiring a status update on her FMLA paperwork. She said she faxed it over Tuesday and there is only a 10 day grace period to which the papers have to be turned in before then. Patient states that someone was supposed to call her yesterday with an update and didn't. She is requesting a phone call with information on her paperwork.

## 2023-08-09 NOTE — Telephone Encounter (Signed)
Continuous

## 2023-08-10 ENCOUNTER — Encounter: Payer: Self-pay | Admitting: Internal Medicine

## 2023-08-10 LAB — HM MAMMOGRAPHY

## 2023-08-10 NOTE — Telephone Encounter (Signed)
I assume you mean a renewal? A continuous leave is for an acute illness which has a start and stop date?

## 2023-08-10 NOTE — Telephone Encounter (Signed)
Correction Renewal for FMLA forms

## 2023-08-10 NOTE — Telephone Encounter (Signed)
Updated and placed inside providers box

## 2023-08-10 NOTE — Telephone Encounter (Signed)
Ok to fill out and update dates on form same as prior I will sign

## 2023-08-21 NOTE — Telephone Encounter (Signed)
Patient stated she did not request a refill on ondansetron at this time.  Feeling well denied concerns seen in workcenter A&Ox3 gait sure and steady skin warm dry and pink respirations even and unlabored RA spoke full sentences without difficulty

## 2023-09-04 ENCOUNTER — Encounter: Payer: Self-pay | Admitting: Registered Nurse

## 2023-09-04 ENCOUNTER — Ambulatory Visit: Payer: No Typology Code available for payment source | Admitting: Registered Nurse

## 2023-09-04 VITALS — BP 144/87 | HR 97 | Temp 97.2°F | Resp 16

## 2023-09-04 DIAGNOSIS — J209 Acute bronchitis, unspecified: Secondary | ICD-10-CM

## 2023-09-04 MED ORDER — PREDNISONE 10 MG PO TABS: ORAL_TABLET | ORAL | 0 refills | Status: AC

## 2023-09-04 NOTE — Patient Instructions (Signed)

## 2023-09-04 NOTE — Progress Notes (Signed)
 Subjective:    Patient ID: Kelly Reed, female    DOB: Jun 08, 1966, 58 y.o.   MRN: [id number]  57y/o established caucasian female here for evaluation cough, hoarse voice, post nasal drip.  Denied fever/chills/n/v/d.  Mucous clear yellow.  Has had to use her albuterol inhaler twice a day yesterday  Coughing more at work today and last night at home.Almost left work early yesterday but didn't have enough PTO time remaining      Review of Systems  Constitutional:  Positive for fatigue. Negative for chills and fever.  HENT:  Positive for congestion, postnasal drip, rhinorrhea, sinus pressure, sinus pain and sore throat. Negative for dental problem, ear discharge, facial swelling, hearing loss, mouth sores, nosebleeds, trouble swallowing and voice change.   Eyes:  Negative for photophobia and visual disturbance.  Respiratory:  Positive for cough, chest tightness and wheezing. Negative for stridor.   Cardiovascular:  Negative for chest pain and palpitations.  Gastrointestinal:  Negative for diarrhea, nausea and vomiting.  Genitourinary:  Negative for difficulty urinating.  Musculoskeletal:  Negative for back pain, gait problem, neck pain and neck stiffness.  Skin:  Negative for rash.  Neurological:  Negative for dizziness, tremors, seizures, syncope, facial asymmetry, speech difficulty, weakness, light-headedness and numbness.  Hematological:  Negative for adenopathy. Does not bruise/bleed easily.  Psychiatric/Behavioral:  Positive for sleep disturbance. Negative for agitation and confusion.        Objective:   Physical Exam Vitals reviewed.  Constitutional:      General: She is awake. She is not in acute distress.    Appearance: Normal appearance. She is well-developed, well-groomed and normal weight. She is ill-appearing. She is not toxic-appearing or diaphoretic.  HENT:     Head: Normocephalic and atraumatic.     Jaw: There is normal jaw occlusion.     Salivary Glands: Right  salivary gland is not diffusely enlarged or tender. Left salivary gland is not diffusely enlarged or tender.     Right Ear: Hearing, ear canal and external ear normal. No decreased hearing noted. No laceration, drainage, swelling or tenderness. A middle ear effusion is present. There is no impacted cerumen. No foreign body. No mastoid tenderness. No PE tube. No hemotympanum. Tympanic membrane is not injected, scarred, perforated, erythematous, retracted or bulging.     Left Ear: Hearing, ear canal and external ear normal. No decreased hearing noted. No laceration, drainage, swelling or tenderness. A middle ear effusion is present. There is no impacted cerumen. No foreign body. No mastoid tenderness. No PE tube. No hemotympanum. Tympanic membrane is not injected, scarred, perforated, erythematous, retracted or bulging.     Nose: Nasal tenderness, mucosal edema, congestion and rhinorrhea present. No nasal deformity, signs of injury or laceration. Rhinorrhea is clear.     Right Nostril: No epistaxis.     Left Nostril: No epistaxis.     Right Turbinates: Enlarged and swollen. Not pale.     Left Turbinates: Enlarged and swollen. Not pale.     Right Sinus: Maxillary sinus tenderness and frontal sinus tenderness present.     Left Sinus: Maxillary sinus tenderness and frontal sinus tenderness present.     Comments: Bilateral maxillary and frontal sinuses TTP; bilateral nasal turbinates edema/erythema clear discharge; cobblestoning posterior pharynx; bilateral allergic shiners/ nasal sniffing and throat clearing observed in clinic and raspy throaty voice today    Mouth/Throat:     Lips: Pink. No lesions.     Mouth: Mucous membranes are moist. No oral lesions or angioedema.  Dentition: No dental abscesses or gum lesions.     Tongue: No lesions. Tongue does not deviate from midline.     Palate: No mass and lesions.     Pharynx: Uvula midline. Pharyngeal swelling, posterior oropharyngeal erythema and  postnasal drip present. No oropharyngeal exudate or uvula swelling.     Tonsils: No tonsillar exudate or tonsillar abscesses.  Eyes:     General: Lids are normal. Vision grossly intact. Gaze aligned appropriately. Allergic shiner present. No scleral icterus.       Right eye: No discharge.        Left eye: No discharge.     Extraocular Movements: Extraocular movements intact.     Conjunctiva/sclera: Conjunctivae normal.     Pupils: Pupils are equal, round, and reactive to light.  Neck:     Trachea: Trachea and phonation normal.  Cardiovascular:     Rate and Rhythm: Normal rate and regular rhythm.     Pulses: Normal pulses.          Radial pulses are 2+ on the right side and 2+ on the left side.     Heart sounds: Normal heart sounds, S1 normal and S2 normal.  Pulmonary:     Effort: Pulmonary effort is normal. No respiratory distress.     Breath sounds: Normal air entry. No stridor. Examination of the right-lower field reveals decreased breath sounds. Examination of the left-lower field reveals decreased breath sounds. Decreased breath sounds present. No wheezing, rhonchi or rales.     Comments: No cough observed in exam room; spoke full sentences without difficulty; throat clearing and nasal sniffing observed Abdominal:     Palpations: Abdomen is soft.  Musculoskeletal:        General: Normal range of motion.     Right hand: Normal strength. Normal capillary refill.     Left hand: Normal strength. Normal capillary refill.     Cervical back: Normal range of motion and neck supple. No swelling, edema, deformity, erythema, signs of trauma, lacerations, rigidity, spasms, torticollis, tenderness or crepitus. No pain with movement or muscular tenderness. Normal range of motion.     Thoracic back: No swelling, edema, deformity, signs of trauma, lacerations, spasms or tenderness. Normal range of motion.     Right lower leg: No edema.     Left lower leg: No edema.  Lymphadenopathy:     Head:      Right side of head: No submental, submandibular, tonsillar, preauricular, posterior auricular or occipital adenopathy.     Left side of head: No submental, submandibular, tonsillar, preauricular, posterior auricular or occipital adenopathy.     Cervical: No cervical adenopathy.     Right cervical: No superficial, deep or posterior cervical adenopathy.    Left cervical: No superficial, deep or posterior cervical adenopathy.  Skin:    General: Skin is warm and dry.     Capillary Refill: Capillary refill takes less than 2 seconds.     Coloration: Skin is not ashen, cyanotic, jaundiced, mottled, pale or sallow.     Findings: No abrasion, abscess, acne, bruising, burn, ecchymosis, erythema, signs of injury, laceration, lesion, petechiae, rash or wound.     Nails: There is no clubbing.  Neurological:     General: No focal deficit present.     Mental Status: She is alert and oriented to person, place, and time. Mental status is at baseline.     GCS: GCS eye subscore is 4. GCS verbal subscore is 5. GCS motor subscore is 6.  Cranial Nerves: No cranial nerve deficit, dysarthria or facial asymmetry.     Sensory: Sensation is intact. No sensory deficit.     Motor: Motor function is intact. No weakness, tremor, atrophy, abnormal muscle tone or seizure activity.     Coordination: Coordination is intact. Coordination normal.     Gait: Gait normal.     Comments: In/out of chair without difficulty; bilateral hand grasp equal 5/5; gait sure and steady in clinic  Psychiatric:        Attention and Perception: Attention and perception normal.        Mood and Affect: Mood and affect normal.        Speech: Speech normal.        Behavior: Behavior normal. Behavior is cooperative.        Thought Content: Thought content normal.        Cognition and Memory: Cognition and memory normal.        Judgment: Judgment normal.    Home covid test results negative       Assessment & Plan:   A-acute  bronchitis/asthma exacerbation   P-discussed take prednisone taper 10mg  sig t3x2 days, t2x2 days then t1x2days #21 RF0 dispensed from [facility] to patient today.  continue albuterol 2 puffs use lunch and dinner and symbicort am/hs.  Follow up with clinic staff if new or worsening symptoms.  Avoid skipping meals, hydrate with water to keep urine pale yellow clear and voiding every 2-4 hours while awake.  May need nap after work this week.  Budget 7-8 hours for sleep at night.  Discussed cover nose/mouth with scarf if outside during below or near freezing temperatures.  Discussed may notice more sinus pressure with weather pattern changes from freezing at night/day to 70s day/40s night barometric pressure changes/wind.  Cover nose/mouse when outside in wind today/tomorrow.  Given 1 free [place] govt home covid test to complete prior to returning to work center.  Discussed trees and bushes budding out pollen counts will be increasing ensure she has started her seasonal allergy regimen now e.g. nasal saline, antihistamine, nasal steroid and showering prior to bed not sleeping with windows open at night.  Patient verbalized understanding information/instructions, agreed with plan of care and had no further questions at this time.

## 2023-09-11 ENCOUNTER — Ambulatory Visit: Admitting: Registered Nurse

## 2023-09-11 ENCOUNTER — Encounter: Payer: Self-pay | Admitting: Registered Nurse

## 2023-09-11 VITALS — BP 142/79 | HR 89 | Temp 98.9°F

## 2023-09-11 DIAGNOSIS — J4541 Moderate persistent asthma with (acute) exacerbation: Secondary | ICD-10-CM

## 2023-09-11 DIAGNOSIS — J019 Acute sinusitis, unspecified: Secondary | ICD-10-CM

## 2023-09-11 MED ORDER — SALINE SPRAY 0.65 % NA SOLN: 2.0000 | NASAL | Status: DC

## 2023-09-11 NOTE — Progress Notes (Signed)
 Subjective:    Patient ID: Kelly Reed, female    DOB: March 31, 1966, 58 y.o.   MRN: [id number]  57y/o caucasian female established patient was feeling better but then Sunday feeling worse, sinus pressure/pain, post nasal drip, and cough started worsening. Took the left over prednisone she had at home. Saturday ran errands and was feeling pretty good.  Has not home covid tested.  Feels like vitamin D and other supplement is helping with her energy levels.  Unsure if this allergy flare up or virus.  Almost called out sick today but wanted to see me in clinic.  Fatigued.      Review of Systems  Constitutional:  Positive for fatigue. Negative for chills, diaphoresis and fever.  HENT:  Positive for congestion, ear pain, postnasal drip, rhinorrhea, sinus pressure, sinus pain and sore throat. Negative for ear discharge, facial swelling, hearing loss, mouth sores, nosebleeds, trouble swallowing and voice change.   Eyes:  Negative for photophobia, pain, discharge, itching and visual disturbance.  Respiratory:  Positive for cough. Negative for choking and stridor.   Cardiovascular:  Negative for chest pain and palpitations.  Gastrointestinal:  Negative for diarrhea, nausea and vomiting.  Genitourinary:  Negative for difficulty urinating.  Musculoskeletal:  Negative for gait problem, myalgias, neck pain and neck stiffness.  Skin:  Negative for rash.  Allergic/Immunologic: Positive for environmental allergies.  Neurological:  Positive for headaches. Negative for dizziness, tremors, seizures, syncope, facial asymmetry, speech difficulty, weakness, light-headedness and numbness.  Hematological:  Negative for adenopathy. Does not bruise/bleed easily.  Psychiatric/Behavioral:  Positive for sleep disturbance. Negative for agitation and confusion.        Objective:   Physical Exam Vitals and nursing note reviewed.  Constitutional:      General: She is awake. She is not in acute distress.     Appearance: She is well-developed, well-groomed and overweight. She is ill-appearing. She is not toxic-appearing or diaphoretic.  HENT:     Head: Normocephalic and atraumatic.     Jaw: There is normal jaw occlusion. No trismus.     Salivary Glands: Right salivary gland is not diffusely enlarged or tender. Left salivary gland is not diffusely enlarged or tender.     Right Ear: Hearing, ear canal and external ear normal. No decreased hearing noted. No laceration, drainage, swelling or tenderness. A middle ear effusion is present. There is no impacted cerumen. No foreign body. No mastoid tenderness. No PE tube. No hemotympanum. Tympanic membrane is not injected, scarred, perforated, erythematous, retracted or bulging.     Left Ear: Hearing, ear canal and external ear normal. No decreased hearing noted. No laceration, drainage, swelling or tenderness. A middle ear effusion is present. There is no impacted cerumen. No foreign body. No mastoid tenderness. No PE tube. No hemotympanum. Tympanic membrane is not injected, scarred, perforated, erythematous, retracted or bulging.     Ears:     Comments: Bilateral TMs intact air fluid level clear; no debris noted bilateral auditory canals    Nose: Congestion and rhinorrhea present. Rhinorrhea is clear.     Right Nostril: No epistaxis.     Left Nostril: No epistaxis.     Right Turbinates: Enlarged and swollen. Not pale.     Left Turbinates: Enlarged and swollen. Not pale.     Right Sinus: Maxillary sinus tenderness and frontal sinus tenderness present.     Left Sinus: Maxillary sinus tenderness and frontal sinus tenderness present.     Comments: Maxillary greater than frontal sinuses TTP  bilaterally; cobblestoning posterior pharynx; bilateral allergic shiners;     Mouth/Throat:     Lips: Pink. No lesions.     Mouth: Mucous membranes are moist. No injury, oral lesions or angioedema.     Dentition: No gum lesions.     Tongue: No lesions. Tongue does not deviate  from midline.     Palate: No mass and lesions.     Pharynx: Uvula midline. Pharyngeal swelling, posterior oropharyngeal erythema and postnasal drip present. No oropharyngeal exudate.     Tonsils: No tonsillar exudate or tonsillar abscesses.     Comments: Nasal congestion audible; nasal sniffing and throat clearing observed in clinic Eyes:     General: Lids are normal. Gaze aligned appropriately. Allergic shiner present. No scleral icterus.       Right eye: No discharge.        Left eye: No discharge.     Extraocular Movements: Extraocular movements intact.     Conjunctiva/sclera: Conjunctivae normal.     Pupils: Pupils are equal, round, and reactive to light.  Neck:     Trachea: Trachea and phonation normal.  Cardiovascular:     Rate and Rhythm: Normal rate and regular rhythm.     Pulses: Normal pulses.          Radial pulses are 2+ on the right side and 2+ on the left side.     Heart sounds: Normal heart sounds, S1 normal and S2 normal.  Pulmonary:     Effort: Pulmonary effort is normal. No respiratory distress.     Breath sounds: Normal breath sounds and air entry. No stridor or transmitted upper airway sounds. No decreased breath sounds, wheezing, rhonchi or rales.     Comments: Intermittent dry cough/throat clearing observed in clinic; spoke full sentences without diffculty Abdominal:     Palpations: Abdomen is soft.  Musculoskeletal:        General: Normal range of motion.     Right hand: Normal strength. Normal capillary refill.     Left hand: Normal strength. Normal capillary refill.     Cervical back: Normal range of motion and neck supple. No swelling, edema, deformity, erythema, signs of trauma, lacerations, rigidity, spasms, torticollis, tenderness or crepitus. No pain with movement, spinous process tenderness or muscular tenderness. Normal range of motion.     Thoracic back: No swelling, edema, deformity, signs of trauma, lacerations, spasms or tenderness. Normal range of  motion.     Right lower leg: No edema.     Left lower leg: No edema.  Lymphadenopathy:     Head:     Right side of head: No submental, submandibular, tonsillar, preauricular, posterior auricular or occipital adenopathy.     Left side of head: No submental, submandibular, tonsillar, preauricular, posterior auricular or occipital adenopathy.     Cervical: No cervical adenopathy.     Right cervical: No superficial, deep or posterior cervical adenopathy.    Left cervical: No superficial, deep or posterior cervical adenopathy.  Skin:    General: Skin is warm and dry.     Capillary Refill: Capillary refill takes less than 2 seconds.     Coloration: Skin is not ashen, cyanotic, jaundiced, mottled, pale or sallow.     Findings: No abrasion, abscess, acne, bruising, burn, ecchymosis, erythema, laceration, lesion, petechiae, rash or wound.     Nails: There is no clubbing.  Neurological:     General: No focal deficit present.     Mental Status: She is alert and oriented to  person, place, and time. Mental status is at baseline.     GCS: GCS eye subscore is 4. GCS verbal subscore is 5. GCS motor subscore is 6.     Cranial Nerves: No cranial nerve deficit, dysarthria or facial asymmetry.     Sensory: No sensory deficit.     Motor: No weakness, tremor, atrophy, abnormal muscle tone or seizure activity.     Coordination: Coordination is intact. Coordination normal.     Gait: Gait normal.     Comments: In/out of chair and on/off exam table without difficulty; gait sure and steady in clinic; bilateral hand grasp equal 5/5  Psychiatric:        Attention and Perception: Attention and perception normal.        Mood and Affect: Mood and affect normal.        Speech: Speech normal.        Behavior: Behavior normal. Behavior is cooperative.        Thought Content: Thought content normal.        Cognition and Memory: Cognition and memory normal.        Judgment: Judgment normal.      Home covid test  results negative.     Assessment & Plan:  A-acute bronchitis/asthma exacerbation, acute rhinosinusitis   P-most likely viral URI but allergies may be confounding.  Discussed covid, flu, adenovirus, RSV still high in the community.  Discussed BBS CTA, sp02 improved from last month.  Further steroids not indicated at this time.  Continue albuterol 2 puffs use lunch and dinner and symbicort am/hs.  Follow up with clinic staff if new or worsening symptoms.  Avoid skipping meals, hydrate with water to keep urine pale yellow clear and voiding every 2-4 hours while awake.  May need nap after work this week.  Budget 7-8 hours for sleep at night.  Discussed cover nose/mouth with scarf if outside during below or near freezing temperatures.  Discussed may notice more sinus pressure with weather pattern changes from freezing at night/day to 70s day/40s night barometric pressure changes/wind.  Cover nose/mouse when outside in wind tomorrow.  Given 1 free [place] [employer] home covid test to complete prior to returning to work center.  Discussed trees and bushes budding out pollen counts will be increasing, green pollen dust noted on vehicles in parking lots already; may use bromphed DM she has at home 1 tsp po QID prn cough/rhinitis.  ensure she has started her seasonal allergy regimen now e.g. nasal saline, antihistamine, nasal steroid and showering prior to bed not sleeping with windows open at night.  Patient verbalized understanding information/instructions, agreed with plan of care and had no further questions at this time.

## 2023-09-11 NOTE — Progress Notes (Signed)
 Covid test , NP will chart results.

## 2023-09-11 NOTE — Patient Instructions (Signed)
Allergic Rhinitis, Adult  Allergic rhinitis is an allergic reaction that affects the mucous membrane inside the nose. The mucous membrane is the tissue that produces mucus. There are two types of allergic rhinitis: Seasonal. This type is also called hay fever and happens only during certain seasons. Perennial. This type can happen at any time of the year. Allergic rhinitis cannot be spread from person to person. This condition can be mild, bad, or very bad. It can develop at any age and may be outgrown. What are the causes? This condition is caused by allergens. These are things that can cause an allergic reaction. Allergens may differ for seasonal allergic rhinitis and perennial allergic rhinitis. Seasonal allergic rhinitis is caused by pollen. Pollen can come from grasses, trees, and weeds. Perennial allergic rhinitis may be caused by: Dust mites. Proteins in a pet's pee (urine), saliva, or dander. Dander is dead skin cells from a pet. Smoke, mold, or car fumes. Remains of or waste from insects such as cockroaches. What increases the risk? You are more likely to develop this condition if you have a family history of allergies or other conditions related to allergies, including: Allergic conjunctivitis. This is irritation and swelling of parts of the eyes and eyelids. Asthma. This condition affects the lungs and makes it hard to breathe. Atopic dermatitis or eczema. This is long term (chronic) irritation and swelling of the skin. Food allergies. What are the signs or symptoms? Symptoms of this condition include: Sneezing or coughing. A stuffy nose (nasal congestion), itchy nose, or nasal discharge. Itchy eyes and tearing of the eyes. A feeling of mucus dripping down the back of your throat (postnasal drip). This may cause a sore throat. Trouble sleeping. Tiredness. Headache. How is this diagnosed? This condition may be diagnosed with your symptoms, your medical history, and a physical  exam. Your health care provider may check for related conditions, such as: Asthma. Pink eye. This is eye swelling and irritation caused by infection (conjunctivitis). Ear infection. Upper respiratory infection. This is an infection in the nose, throat, or upper airways. You may also have tests to find out which allergens cause your symptoms. These may include skin tests or blood tests. How is this treated? There is no cure for this condition, but treatment can help control symptoms. Treatment may include: Taking medicines that block allergy symptoms, such as corticosteroids (anti-inflammatories) and antihistamines. Medicine may be given as a shot, nasal spray, or pill. Avoiding any allergens. Being exposed again and again to tiny amounts of allergens to help you build a defense against allergens (allergenimmunotherapy). This is done if other treatments have not helped. It may include: Allergy shots. These are injected medicines that have small amounts of an allergen in them. Sublingual immunotherapy. This involves taking small doses of a medicine with an allergen in it under your tongue. If these treatments do not work, your provider may prescribe newer, stronger medicines. Follow these instructions at home: Avoiding allergens Find out what you are allergic to and avoid those allergens. These are some things you can do to help avoid allergens: If you have perennial allergies: Replace carpet with wood, tile, or vinyl flooring. Carpet can trap dander and dust. Do not smoke. Do not allow smoking in your home Change your heating and air conditioning filters at least once a month. If you have seasonal allergies, take these steps during allergy season: Keep windows closed as much as possible. Plan outdoor activities when pollen counts are lowest. Check pollen counts before  you plan outdoor activities When coming indoors, change clothing and shower before sitting on furniture or bedding. If you  have a pet in the house that produces allergens: Keep the pet out of the bedroom. Vacuum, sweep, and dust regularly. General instructions Take over-the-counter and prescription medicines only as told by your provider. Drink enough fluid to keep your pee pale yellow. Where to find more information American Academy of Allergy, Asthma & Immunology: aaaai.org Contact a health care provider if: You have a fever. You develop a cough that does not go away. You make high-pitched whistling sounds when you breathe, most often when you breathe out (wheeze). Your symptoms slow you down or stop you from doing your normal activities each day. Get help right away if: You have shortness of breath. This symptom may be an emergency. Get help right away. Call 911. Do not wait to see if the symptoms will go away. Do not drive yourself to the hospital. This information is not intended to replace advice given to you by your health care provider. Make sure you discuss any questions you have with your health care provider. Document Revised: 03/06/2022 Document Reviewed: 03/06/2022 Elsevier Patient Education  2024 [employer] Inc. Sinus Pain  Sinus pain may occur when your sinuses become clogged or swollen. Sinuses are air-filled spaces in your skull that are behind the bones of your face and forehead. Sinus pain can range from mild to severe. What are the causes? Sinus pain can result from various conditions that affect the sinuses. Common causes include: Colds. Sinus infections. Allergies. What are the signs or symptoms? The main symptom of this condition is pain or pressure in your face, forehead, ears, or upper teeth. People who have sinus pain often have other symptoms, such as: Congested or runny nose. Fever. Inability to smell. Headache. Weather changes can make symptoms worse. How is this diagnosed? Your health care provider will diagnose this condition based on your symptoms and a physical exam.  If you have pain that keeps coming back or does not go away, your health care provider may recommend more testing. This may include: Imaging tests, such as a CT scan or MRI, to check for problems with your sinuses. Examination of your sinuses using a thin tool with a camera that is inserted through your nose (endoscopy). How is this treated? Treatment for this condition depends on the cause. Sinus pain that is caused by a sinus infection may be treated with antibiotic medicine. Sinus pain that is caused by congestion may be helped by rinsing out (flushing) the nose and sinuses with saline solution. Sinus pain that is caused by allergies may be helped by allergy medicines (antihistamines) and medicated nasal sprays. Sinus surgery may be needed in some cases if other treatments do not help. Follow these instructions at home: General instructions If directed: Apply a warm, moist washcloth to your face to help relieve pain. Use a nasal saline wash. Follow the directions on the bottle or box. Hydrate and humidify Drink enough water to keep your urine clear or pale yellow. Staying hydrated will help to thin your mucus. Use a humidifier if your home is dry. Inhale steam for 10-15 minutes, 3-4 times a day or as told by your health care provider. You can do this in the bathroom while a hot shower is running. Limit your exposure to cool or dry air. Medicines  Take over-the-counter and prescription medicines only as told by your health care provider. If you were prescribed an antibiotic  medicine, take it as told by your health care provider. Do not stop taking the antibiotic even if you start to feel better. If you have congestion, use a nasal spray to help lessen pressure. Contact a health care provider if: You have sinus pain more than one time a week. You have sensitivity to light or sound. You develop a fever. You feel nauseous or you vomit. Your sinus pain or headache does not get better with  treatment. Get help right away if: You have vision problems. You have sudden, severe pain in your face or head. You have a seizure. You are confused. You have a stiff neck. Summary Sinus pain occurs when your sinuses become clogged or swollen. Sinus pain can result from various conditions that affect the sinuses, such as a cold, a sinus infection, or an allergy. Treatment for this condition depends on the cause. It may include medicine, such as antibiotics or antihistamines. This information is not intended to replace advice given to you by your health care provider. Make sure you discuss any questions you have with your health care provider. Document Revised: 05/29/2021 Document Reviewed: 05/29/2021 Elsevier Patient Education  2024 Elsevier Inc. How to Perform a Sinus Rinse A sinus rinse is a home treatment that is used to rinse your sinuses with a germ-free (sterile) mixture of salt and water (saline solution). Sinuses are air-filled spaces in your skull that are behind the bones of your face and forehead. They open into your nasal cavity. A sinus rinse can help to clear mucus, dirt, dust, or pollen from your nasal cavity. You may do a sinus rinse when you have a cold, a virus, nasal allergy symptoms, a sinus infection, or stuffiness in your nose or sinuses. What are the risks? A sinus rinse is generally safe and effective. However, there are a few risks, which include: A burning sensation in your sinuses. This may happen if you do not make the saline solution as directed. Be sure to follow all directions when making the saline solution. Nasal irritation. Infection. This may be from unclean supplies or from contaminated water. Infection from contaminated water is rare, but possible. Do not do a sinus rinse if you have had ear or nasal surgery, ear infection, or plugged ears, unless recommended by your health care provider. Supplies needed: Saline solution or powder. Distilled or sterile  water to mix with saline powder. You may use boiled and cooled tap water. Boil tap water for 5 minutes; cool until it is lukewarm. Use within 24 hours. Do not use regular tap water to mix with the saline solution. Neti pot or nasal rinse bottle. These supplies release the saline solution into your nose and through your sinuses. Neti pots and nasal rinse bottles can be purchased at [employer], a health food store, or online. How to perform a sinus rinse  Wash your hands with soap and water for at least 20 seconds. If soap and water are not available, use hand sanitizer. Wash your device according to the directions that came with the product and then dry it. Use the solution that comes with your product or one that is sold separately in stores. Follow the mixing directions on the package to mix with sterile or distilled water. Fill the device with the amount of saline solution noted in the device instructions. Stand by a sink and tilt your head sideways over the sink. Place the spout of the device in your upper nostril (the one closer to the  ceiling). Gently pour or squeeze the saline solution into your nasal cavity. The liquid should drain out from the lower nostril if you are not too congested. While rinsing, breathe through your open mouth. Gently blow your nose to clear any mucus and rinse solution. Blowing too hard may cause ear pain. Turn your head in the other direction and repeat in your other nostril. Clean and rinse your device with clean water and then air-dry it. Talk with your health care provider or pharmacist if you have questions about how to do a sinus rinse. Summary A sinus rinse is a home treatment that is used to rinse your sinuses with a sterile mixture of salt and water (saline solution). You may do a sinus rinse when you have a cold, a virus, nasal allergy symptoms, a sinus infection, or stuffiness in your nose or sinuses. A sinus rinse is generally safe and  effective. Follow all instructions carefully. This information is not intended to replace advice given to you by your health care provider. Make sure you discuss any questions you have with your health care provider. Document Revised: 12/13/2020 Document Reviewed: 12/13/2020 Elsevier Patient Education  2024 [employer].

## 2023-09-24 ENCOUNTER — Encounter: Payer: Self-pay | Admitting: Registered Nurse

## 2023-09-24 DIAGNOSIS — A084 Viral intestinal infection, unspecified: Secondary | ICD-10-CM

## 2023-09-24 MED ORDER — ONDANSETRON 8 MG PO TBDP: 8.0000 mg | ORAL_TABLET | Freq: Three times a day (TID) | ORAL | 0 refills | Status: AC | PRN

## 2023-09-24 NOTE — Telephone Encounter (Signed)
 Patient reported diarrhea started yesterday; fever 101 overnight and vomiting this am.  Has run out of zofran and needs refill.  Home covid test negative.  Tolerating po intake this afternoon but still having diarrhea.   I have recommended clear fluids and bland diet.  Avoid dairy/spicy, fried and large portions of meat while having nausea.  If vomiting hold po intake x 1 hour.  Then sips clear fluids like broths, ginger ale, power ade, gatorade, pedialyte may advance to soft/bland if no vomiting x 24 hours and appetite returned otherwise hydration main focus.  zofran 8mg  po BID prn n/v #10 RF0 sent to her pharmacy of choice  Stay home as met employee communicable disease policy diarrhea/vomiting/fever needs to be resolved 48 hours prior to return to work.  Re-evaluation tomorrow via telephone.   I have alerted the patient to call if high fever, dehydration, marked weakness, fainting, increased abdominal pain, blood in stool or vomit (red or black). Patient did not want new [employer] handouts on nausea/vomiting.HR team/supervisor notified excused absence 3/17-2/18/25.  Was out of town this weekend visiting adult child and they are both well today.  Patient verbalized agreement and understanding of treatment plan and had no further questions at this time.

## 2023-09-25 NOTE — Telephone Encounter (Signed)
 Patient contacted via telephone 09/25/23 at 1709 stated had some nausea this am took a zofran pill then ate breakfast a couple hours later and been normal rest of day would like to return to work tomorrow.  Denied fever/vomiting/diarrhea today.  Discussed if overnight worsening symptoms use normal call out procedures with supervior.  HR notified patient cleared to return onsite tomorrow 09/26/2023  Patient A&Ox3 spoke full sentences without difficulty agreed with plan of care and had no further questions at this time.

## 2023-10-01 ENCOUNTER — Other Ambulatory Visit: Payer: Self-pay | Admitting: Internal Medicine

## 2023-10-01 MED ORDER — RYBELSUS 14 MG PO TABS: 14.0000 mg | ORAL_TABLET | Freq: Every day | ORAL | 3 refills | Status: AC

## 2023-10-01 NOTE — Telephone Encounter (Signed)
 Copied from CRM [id number]. Topic: Clinical - Medication Refill >> Oct 01, 2023  8:37 AM Pascal Lux wrote: Most Recent Primary Care Visit:  Provider: Hillard Danker A  Department: [facility] [place]  Visit Type: OFFICE VISIT  Date: 08/03/2023  Medication: Semaglutide (RYBELSUS) 14 MG TABS [[id number]]  Has the patient contacted their pharmacy? Yes (Agent: If no, request that the patient contact the pharmacy for the refill. If patient does not wish to contact the pharmacy document the reason why and proceed with request.) (Agent: If yes, when and what did the pharmacy advise?) Told to call provider  Is this the correct pharmacy for this prescription? Yes If no, delete pharmacy and type the correct one.  This is the patient's preferred pharmacy:  [id number] - [place], [place] - [address] [place] [address] Phone: [phone] Fax: [phone]   Has the prescription been filled recently? No  Is the patient out of the medication? Yes  Has the patient been seen for an appointment in the last year OR does the patient have an upcoming appointment? Yes  Can we respond through MyChart? Yes  Agent: Please be advised that Rx refills may take up to 3 business days. We ask that you follow-up with your pharmacy.

## 2023-10-02 ENCOUNTER — Ambulatory Visit: Admitting: Registered Nurse

## 2023-10-02 ENCOUNTER — Encounter: Payer: Self-pay | Admitting: Registered Nurse

## 2023-10-02 VITALS — BP 137/80 | HR 93

## 2023-10-02 DIAGNOSIS — M79644 Pain in right finger(s): Secondary | ICD-10-CM

## 2023-10-02 DIAGNOSIS — M7989 Other specified soft tissue disorders: Secondary | ICD-10-CM

## 2023-10-02 DIAGNOSIS — R202 Paresthesia of skin: Secondary | ICD-10-CM

## 2023-10-02 DIAGNOSIS — J4541 Moderate persistent asthma with (acute) exacerbation: Secondary | ICD-10-CM

## 2023-10-02 DIAGNOSIS — K219 Gastro-esophageal reflux disease without esophagitis: Secondary | ICD-10-CM

## 2023-10-02 MED ORDER — SUCRALFATE 1 G PO TABS: 1.0000 g | ORAL_TABLET | Freq: Three times a day (TID) | ORAL | 3 refills | Status: AC

## 2023-10-02 MED ORDER — CALCIUM CARBONATE ANTACID 500 MG PO CHEW: 1.0000 | CHEWABLE_TABLET | Freq: Three times a day (TID) | ORAL | Status: AC | PRN

## 2023-10-02 MED ORDER — ACETAMINOPHEN 500 MG PO TABS: 1000.0000 mg | ORAL_TABLET | Freq: Four times a day (QID) | ORAL | Status: AC | PRN

## 2023-10-02 NOTE — Progress Notes (Signed)
 Subjective:    Patient ID: Kelly Reed, female    DOB: 06/21/1966, 58 y.o.   MRN: [id number]  57y/o married caucasian female established patient here to discuss reflux was very bad last night triggered an asthma attack.  Sore throat, hoarse voice.  Sipped very cold water middle of the night only thing that would help throat.  Unsure why she is having flare up but the last time needed carafate liquid when she saw GI and it worked well for her.  Her diarrhea and nausea from 09/23/23 had resolved. Denied new fever/chills/congestion/rhinitis/headache. Bought a pillow/bolster so she could sleep upright comfortably in bed and support her arms.  Has not needed zofran.  Had to use her albuterol inhaler in the middle of the night last night.  She also wanted to discuss her hand swelling and right thumb pain.  She has tried to wear thumb spica wrist splint when sleeping but it is very uncomfortable.  She has been wearing copper fit neoprene/spandex gloves at work yesterday and today but hands still swelling, right thumb pain and occasional tingling in fingers.  Patient denied trauma/injury/bruising or rash.  Has not tried icing/elevation during breaks at work or after work yet.      Review of Systems  Constitutional:  Negative for chills, diaphoresis and fever.  HENT:  Positive for postnasal drip, sore throat and voice change. Negative for congestion, dental problem, drooling, ear discharge, ear pain, facial swelling, hearing loss, mouth sores, nosebleeds, rhinorrhea, sinus pressure, sinus pain, sneezing, tinnitus and trouble swallowing.   Eyes:  Negative for photophobia and visual disturbance.  Respiratory:  Positive for cough, choking, chest tightness, shortness of breath, wheezing and stridor.   Cardiovascular:  Negative for palpitations and leg swelling.  Gastrointestinal:  Positive for abdominal pain. Negative for anal bleeding, constipation, diarrhea, nausea and vomiting.  Genitourinary:  Negative  for difficulty urinating.  Musculoskeletal:  Positive for arthralgias, joint swelling and myalgias. Negative for gait problem, neck pain and neck stiffness.  Skin:  Negative for color change, pallor, rash and wound.  Allergic/Immunologic: Positive for environmental allergies.  Neurological:  Negative for dizziness, tremors, seizures, syncope, facial asymmetry, speech difficulty, weakness, light-headedness, numbness and headaches.  Hematological:  Negative for adenopathy. Does not bruise/bleed easily.  Psychiatric/Behavioral:  Positive for sleep disturbance. Negative for agitation and confusion.        Objective:   Physical Exam Vitals and nursing note reviewed.  Constitutional:      General: She is awake. She is not in acute distress.    Appearance: Normal appearance. She is well-developed and well-groomed. She is not ill-appearing, toxic-appearing or diaphoretic.  HENT:     Head: Normocephalic and atraumatic.     Jaw: There is normal jaw occlusion.     Salivary Glands: Right salivary gland is not diffusely enlarged. Left salivary gland is not diffusely enlarged.     Right Ear: Hearing and external ear normal. No decreased hearing noted.     Left Ear: Hearing and external ear normal. No decreased hearing noted.     Nose: Nose normal. No congestion or rhinorrhea.     Right Nostril: No epistaxis.     Left Nostril: No epistaxis.     Mouth/Throat:     Lips: Pink. No lesions.     Mouth: Mucous membranes are moist. No oral lesions or angioedema.     Dentition: No gum lesions.     Tongue: No lesions. Tongue does not deviate from midline.  Palate: No mass and lesions.     Pharynx: Oropharynx is clear. Uvula midline.  Eyes:     General: Lids are normal. Vision grossly intact. Gaze aligned appropriately. Allergic shiner present. No scleral icterus.       Right eye: No discharge.        Left eye: No discharge.     Extraocular Movements: Extraocular movements intact.      Conjunctiva/sclera: Conjunctivae normal.     Pupils: Pupils are equal, round, and reactive to light.  Neck:     Trachea: Trachea and phonation normal.  Cardiovascular:     Rate and Rhythm: Normal rate and regular rhythm.     Pulses: Normal pulses.          Radial pulses are 2+ on the right side and 2+ on the left side.  Pulmonary:     Effort: Pulmonary effort is normal. No respiratory distress.     Breath sounds: Normal breath sounds and air entry. No stridor or transmitted upper airway sounds. No wheezing.     Comments: Spoke full sentences without difficulty; no cough observed in exam room Abdominal:     General: Abdomen is flat.     Palpations: Abdomen is soft.     Tenderness: There is no guarding.  Musculoskeletal:        General: Normal range of motion.     Right shoulder: No swelling, deformity, effusion, laceration or crepitus. Normal range of motion. Normal strength.     Left shoulder: No swelling, deformity, effusion, laceration or crepitus. Normal range of motion. Normal strength.     Right elbow: No swelling, deformity, effusion or lacerations. Normal range of motion.     Left elbow: No swelling, deformity, effusion or lacerations. Normal range of motion.     Right forearm: No swelling, edema, deformity or lacerations.     Left forearm: No swelling, edema, deformity or lacerations.     Right wrist: No swelling, deformity, effusion, lacerations or crepitus. Normal range of motion.     Left wrist: No swelling, deformity, effusion, lacerations or crepitus. Normal range of motion.     Right hand: Tenderness present. No swelling, deformity, lacerations or bony tenderness. Normal range of motion. Normal strength. Normal sensation. Normal capillary refill.     Left hand: No swelling, deformity, lacerations, tenderness or bony tenderness. Normal range of motion. Normal strength. Normal sensation. Normal capillary refill.     Cervical back: Normal range of motion and neck supple. No  swelling, edema, deformity, erythema, signs of trauma, lacerations, rigidity, torticollis or crepitus. No pain with movement. Normal range of motion.     Thoracic back: No swelling, edema, deformity, signs of trauma or lacerations. Normal range of motion.     Right lower leg: No edema.     Left lower leg: No edema.     Comments: Wearing copper fit glove right hand that doesn't cover finger tips  Lymphadenopathy:     Head:     Right side of head: No submandibular or preauricular adenopathy.     Left side of head: No submandibular or preauricular adenopathy.     Cervical: No cervical adenopathy.     Right cervical: No superficial cervical adenopathy.    Left cervical: No superficial cervical adenopathy.  Skin:    General: Skin is warm and dry.     Capillary Refill: Capillary refill takes less than 2 seconds.     Coloration: Skin is not ashen, cyanotic, jaundiced, mottled, pale or sallow.  Findings: No abrasion, abscess, acne, bruising, burn, ecchymosis, erythema, signs of injury, laceration, lesion, petechiae, rash or wound.     Nails: There is no clubbing.     Comments: Face/neck/hands/arms visually inspected  Neurological:     General: No focal deficit present.     Mental Status: She is alert and oriented to person, place, and time. Mental status is at baseline.     GCS: GCS eye subscore is 4. GCS verbal subscore is 5. GCS motor subscore is 6.     Cranial Nerves: No cranial nerve deficit or facial asymmetry.     Sensory: Sensation is intact. No sensory deficit.     Motor: Motor function is intact. No weakness, tremor, atrophy, abnormal muscle tone or seizure activity.     Coordination: Coordination is intact. Coordination normal.     Gait: Gait is intact. Gait normal.     Comments: In/out of chair without difficulty; gait sure and steady in clinic; bilateral hand grasp equal 5/5  Psychiatric:        Attention and Perception: Attention and perception normal.        Mood and Affect:  Mood and affect normal.        Speech: Speech normal.        Behavior: Behavior normal. Behavior is cooperative.        Thought Content: Thought content normal.        Cognition and Memory: Cognition and memory normal.        Judgment: Judgment normal.     Latest Reference Range & Units 02/02/23 08:55 08/03/23 08:14 08/03/23 08:30  COMPREHENSIVE METABOLIC PANEL  Rpt !  Rpt !  Sodium 135 - 145 mEq/L 139  139  Potassium 3.5 - 5.1 mEq/L 4.2  4.0  Chloride 96 - 112 mEq/L 101  101  CO2 19 - 32 mEq/L 27  27  Glucose 70 - 99 mg/dL 213 (H)  086 (H)  BUN 6 - 23 mg/dL 12  13  Creatinine 5.78 - 1.20 mg/dL 4.69  6.29  Calcium 8.4 - 10.5 mg/dL 9.6  9.5  Alkaline Phosphatase 39 - 117 U/L 64  54  Albumin 3.5 - 5.2 g/dL 4.5  4.7  AST 0 - 37 U/L 39 (H)  19  ALT 0 - 35 U/L 59 (H)  20  Total Protein 6.0 - 8.3 g/dL 7.0  7.4  Total Bilirubin 0.2 - 1.2 mg/dL 0.4  0.5  GFR >52.84 mL/min 91.96  95.12  Total CHOL/HDL Ratio  4  3  Cholesterol 0 - 200 mg/dL 132 (H)  440  HDL Cholesterol >39.00 mg/dL 10.27  25.36  LDL (calc) 0 - 99 mg/dL 644 (H)  98  NonHDL  034.74  123.02  Triglycerides 0.0 - 149.0 mg/dL 259.5  638.7  VLDL 0.0 - 40.0 mg/dL 56.4  33.2  Ferritin 95.1 - 291.0 ng/mL   9.3 (L)  VITD 30.00 - 100.00 ng/mL   34.09  Vitamin B12 211 - 911 pg/mL   568  WBC 4.0 - 10.5 K/uL 6.2  6.0  RBC 3.87 - 5.11 Mil/uL 4.74  4.70  Hemoglobin 12.0 - 15.0 g/dL 88.4  16.6  HCT 06.3 - 46.0 % 42.1  41.7  MCV 78.0 - 100.0 fl 88.7  88.6  MCHC 30.0 - 36.0 g/dL 01.6  01.0  RDW 93.2 - 15.5 % 14.3  14.3  Platelets 150.0 - 400.0 K/uL 239.0  277.0  Hemoglobin A1C -  4.0 - 5.6 % 7.7 (H) Pend  6.9   HbA1c, POC (prediabetic range)   Pend   HbA1c, POC (controlled diabetic range)   Pend   TSH 0.35 - 5.50 uIU/mL   1.03  !: Data is abnormal (H): Data is abnormally high (L): Data is abnormally low Rpt: View report in Results Review for more information  Latest Reference Range & Units 12/27/21 08:40 08/03/23 08:30   Vitamin B12 211 - 911 pg/mL 1,205 (H) 568  (H): Data is abnormally high  34.09 40.89   Normal no swelling fingers/hands at time of office visit full arom fingers/wrists bilaterally     Assessment & Plan:  A-GERD unspecified whether esophagitis, moderate persistent asthma with acute exacerbation, pain of right thumb, bilateral hand swelling, paresthesias in right hand  P-Electronic Rx cafate 1g po QID prn GERD/throat pain #56 RF3 sent to her pharmacy of choice.  Discussed raising head of bed for sleep.  Avoid eating 2 hours prior to bedtime.  Avoid clothes that are tight around the waist/abdomen.  Avoid foods that are known triggers for her GERD/allergies.  May take tums 500mg  2 po QID prn GERD if omeprazole DR 20mg  po BID not controlling GERD along with carafate.  Patient opted for tablets since unsure how long suspension will last in bottle for prn use.  Patient will notify me if tablets not working as well as suspension did in the past.  Avoid peppermints, acidy foods like tomatoes/citrus juices/alcohol.  Avoid lying down shortly after mealtime.  Patient continuing to work on weight loss Patient should alert me if there are persistent symptoms, dysphagia, weight loss or GI bleeding (bright red or black). Exitcare handout on GERD.  Follow up with clinic provider or PCM if symptoms persist despite therapy. Patient verbalized agreement and understanding of treatment plan and had no further questions at this time.      P2:  Diet, Food avoidance that aggravate condition, and Fitness    Ensure taking her antihistamine qam po, singulair at bedtime po, nasal saline prn, symbicort 2 puffs po BID and albuterol 2p po q4h prn chest tightness/wheezing/shortness of breath/protracted coughing.  Shower after work/being outside.  Consider mask when outside as pollen counts high at this time.  Avoid sleeping with windows open on house.  Patient did not feel like she needed steroids at this time.  Sp02 stable.  GERD  flared irritating throat.  Patient will notify clinic staff if new or worsening symptoms.  Patient agreed with plan of care and had no further questions at this time.  Most recent labs January 2025 PCM  DDx hypoglycemia, diabetic neuropathy, low vitamin D/B6/B12, carpal tunnel, tenosynovitis  Patient reported has tried wrist thumb spica splint but uncomfortable wearing copper neoprene gloves at work today and helping some.  Will try wearing splint at home again this evening.  Refused referral to orthopedics at this time  Trial holding frozen water bottle on breaks/at home after work 15 minutes QID prn pain/swelling.  Tylenol 1000mg  po QID prn pain.  If she needs work restrictions will need to see PCM/[facility] provider as unable to write work restrictions in this clinic per contract limitations.  Patient agreed with plan of care and had no further questions at this time.

## 2023-10-02 NOTE — Patient Instructions (Signed)
 GERD in Adults: What to Know  Gastroesophageal reflux (GER) is when acid from your stomach flows up into your esophagus. Your esophagus is the part of your body that moves food from your mouth to your stomach. Normally, food goes down and stays in your stomach to be digested. But with GER, food and stomach acid may go back up. You may have a disease called gastroesophageal reflux disease (GERD) if the reflux: Happens often. Causes very bad symptoms. Makes your esophagus sore and swollen. Over time, GERD can make small holes called ulcers in the lining of your esophagus. What are the causes? GERD is caused by a problem with the muscle between your esophagus and stomach. This muscle is called the lower esophageal sphincter (LES). When it's weak or not normal, it doesn't close like it should. This means food and stomach acid can go back up into your esophagus. The muscle can be weak if: You smoke or use products with tobacco in them. You're pregnant. You have a type of hernia called a hiatal hernia. You eat certain foods and drinks. These include: Alcohol. Coffee. Chocolate. Onions. Peppermint. What increases the risk? Being overweight. Having a disease that affects your connective tissue. Taking NSAIDs, such as ibuprofen. What are the signs or symptoms? Heartburn. Trouble swallowing. Pain when you swallow. The feeling of having a lump in your throat. A bitter taste in your mouth. Bad breath. Having an upset or bloated stomach. Burping. Chest pain. Other conditions can also cause chest pain. Make sure you see your health care provider if you have chest pain. Wheezing. This is when you make high-pitched whistling sounds when you breathe, most often when you breathe out. A long-term cough or a cough at night. How is this diagnosed? GERD may be diagnosed based on your medical history and a physical exam. You may also have tests. These may include: An endoscopy. This test looks at your  stomach and esophagus with a small camera. A barium swallow test. This shows the shape and size of your esophagus and how well it's working. Tests of your esophagus to check for: Acid levels. Pressure. How is this treated? Treatment may depend on how bad your symptoms are. It may include: Changes to your diet and daily life. Medicines. Surgery. Follow these instructions at home: Eating and drinking Follow an eating plan as told by your provider. You may need to avoid certain foods and drinks. These may include: Coffee and tea, with or without caffeine. Alcohol. Energy drinks and sports drinks. Fizzy drinks or sodas. Chocolate and cocoa. Peppermint and mint flavorings. Garlic and onions. Horseradish. Spicy and acidic foods. These include: Peppers. Chili powder and curry powder. Vinegar. Hot sauces and BBQ sauce. Citrus fruits and juices. These include: Oranges. Lemons. Limes. Tomato-based foods. These include: Red sauce and pizza with red sauce. Chili. Salsa. Fried and fatty foods. These include: Donuts. [place] fries. Potato chips. High-fat dressings. High-fat meats. These include: Hot dogs and sausage. Rib eye steak. Ham and bacon. High-fat dairy items. These include: Whole milk. Butter. Cream cheese. Eat small meals often. Avoid eating big meals. Avoid drinking lots of liquid with your meals. Try not to eat meals during the 2-3 hours before bedtime. Try not to lie down right after you eat. Do not exercise right after you eat. Lifestyle  If you're overweight, lose an amount of weight that's healthy for you. Ask your provider about a safe weight loss goal. Do not smoke, vape, or use nicotine or tobacco. Wear  loose clothes. Do not wear things that are tight around your waist. When you sleep, try: Raising the head of your bed about 6 inches (15 cm). You can use a wedge to do this. Lying down on your left side. Try to lower your stress. If you need help doing  this, ask your provider. General instructions Take your medicines only as told. Do not take aspirin or ibuprofen unless you're told to. Watch for any changes in your symptoms. Do not bend over if it makes your symptoms worse. Contact a health care provider if: You have new symptoms. You have trouble: Drinking. Swallowing. Eating. It hurts to swallow. You have wheezing. You have a cough that won't go away. Your voice is hoarse. Your symptoms don't get better with treatment. Get help right away if: You have pain all of a sudden in your: Arm. Neck. Jaw. Teeth. Back. You feel sweaty, dizzy, or light-headed all of a sudden. You faint. You have chest pain or shortness of breath. You vomit and the vomit is: Green, yellow, or black. Looks like blood or coffee grounds. Your poop is red, bloody, or black. These symptoms may be an emergency. Call 911 right away. Do not wait to see if the symptoms will go away. Do not drive yourself to the hospital. This information is not intended to replace advice given to you by your health care provider. Make sure you discuss any questions you have with your health care provider. Document Revised: 05/08/2023 Document Reviewed: 11/22/2022 Elsevier Patient Education  2024 [employer].

## 2023-10-02 NOTE — Progress Notes (Signed)
 C/O moderate pain in joint of right thumb. Pain scale of 10 at end of work day with severe swelling. Wearing a wrist brace. Second complaint is GERD.

## 2023-10-10 NOTE — Telephone Encounter (Signed)
 Patient seen in workcenter 10/04/23 stated feeling well denied concerns. A&Ox3 spoke full sentences without difficulty skin warm dry and pink respirations even and unlabored gait sure and steady.

## 2023-11-01 ENCOUNTER — Ambulatory Visit: Admitting: Registered Nurse

## 2023-11-01 ENCOUNTER — Encounter: Payer: Self-pay | Admitting: Registered Nurse

## 2023-11-01 VITALS — BP 129/77 | HR 101

## 2023-11-01 DIAGNOSIS — M62830 Muscle spasm of back: Secondary | ICD-10-CM

## 2023-11-01 MED ORDER — PREDNISONE 10 MG PO TABS: ORAL_TABLET | ORAL | Status: AC

## 2023-11-01 MED ORDER — METHOCARBAMOL 500 MG PO TABS: 1000.0000 mg | ORAL_TABLET | Freq: Four times a day (QID) | ORAL | 0 refills | Status: AC | PRN

## 2023-11-01 NOTE — Patient Instructions (Signed)
 Gluteus Medius Syndrome Rehab Ask your health care provider which exercises are safe for you. Do exercises exactly as told by your provider and adjust them as directed. It is normal to feel mild stretching, pulling, tightness, or discomfort as you do these exercises. Stop right away if you feel sudden pain or your pain gets worse. Do not begin these exercises until told by your provider. Stretching and range-of-motion exercise This exercise warms up your muscles and joints and improves the movement and flexibility of your hip and pelvis. This exercise also helps to relieve muscle and joint pain and stiffness. Lunge This exercise is also called hip flexor stretch. Kneel on the floor on your left / right knee. Bend your other knee so it is directly over your ankle. Keep good posture with your head over your shoulders. Tuck your tailbone underneath you. This will prevent your back from arching too much. You should feel a gentle stretch in the front of your back thigh or hip (hip flexors). If you do not feel a stretch, slowly lunge forward with your chest up. Hold this position for ____30______ seconds. Slowly return to the starting position. Repeat ____3______ times. Complete this exercise ____2______ times a day. Strengthening exercises These exercises build strength and endurance in your hip and pelvis. Endurance is the ability to use your muscles for a long time, even after they get tired. Bridge This exercise strengthens the muscles that move your thigh backward (hip extensors). Lie on your back on a firm surface with your knees bent and your feet flat on the floor. Tighten your butt muscles and lift your bottom off the floor until the trunk of your body is level with your thighs. You should feel the muscles working in your butt and the back of your thighs. If this exercise is too easy, cross your arms over your chest or lift one leg while your bottom is up and off the floor. Do not arch your  back. Hold this position for ___30_______ seconds. Slowly lower your hips to the starting position. Let your muscles relax completely after each repetition. Repeat ____3______ times. Complete this exercise ______2____ times a day. Straight leg raises, side-lying This exercise strengthens the muscles that rotate the leg at the hip and move it away from your body (hip abductors). Lie on your side with your left / right leg in the top position. Lie so your head, shoulder, knee, and hip line up. Bend your bottom knee slightly to help you balance. Lift your top leg 4-6 inches (10-15 cm) while keeping your toes pointed straight ahead. Hold this position for ___30_______ seconds. Slowly lower your leg to the starting position. Let your muscles relax completely after each repetition. Repeat ______3____ times. Complete this exercise ___2_______ times a day. Hip abduction, quadruped This is an exercise in which your hands and knees are on the floor, and you lift one knee out to the side. Get on your hands and knees on a firm, lightly padded surface. Your hands should be directly below your shoulders, and your knees should be directly below your hips. Lift your left / right knee out to the side. Keep your knee bent. Do not twist your body. Hold this position for ___30_______ seconds. Slowly lower your leg back to the starting position. Repeat ____3______ times. Complete this exercise ________2__ times a day. Single leg stand  Stand near a counter or door frame. Hold on to it as needed. It is helpful to look in a mirror for  this exercise so you can watch your hip. Squeeze your left / right butt muscles, bend your knee and move your foot toward your butt. Do not let your left / right hip push out to the side. Hold this position for ____30______ seconds. Repeat _____3_____ times. Complete this exercise _____2_____ times a day. This information is not intended to replace advice given to you by your health  care provider. Make sure you discuss any questions you have with your health care provider. Document Revised: 02/01/2022 Document Reviewed: 02/01/2022 Elsevier Patient Education  2024 [employer]Muscle Cramps and Spasms Muscle cramps and spasms occur when a muscle or muscles tighten and you have no control over this tightening (involuntary muscle contraction). They are a common problem that can happen in any muscle. The most common place is in the calf muscles of the leg. There are a few ways that muscle cramps and spasms differ: Muscle cramps are painful. They come and go and may last for a few seconds or up to 15 minutes. Muscle cramps are often more forceful and last longer than muscle spasms. Muscle spasms may or may not be painful. They may last just a few seconds or last much longer. Certain conditions, such as diabetes or Parkinson's disease, can make you more likely to have cramps or spasms. But in most cases, cramps and spasms are not caused by other conditions. Common causes include: Overexertion. This is when you do more physical work or exercise than your body is ready for. Overuse from doing the same movements too many times. Staying in one position for too long. Improper preparation, form, or technique when playing a sport or doing an activity. Not enough water or other fluids in your body (dehydration). Other causes may include: Injury. Side effects of some medicines. Too few salts and minerals in your body (electrolytes), such as potassium and calcium . This could happen if you are taking water pills (diuretics) or if you are pregnant. In many cases, the cause of muscle cramps or spasms is not known. Follow these instructions at home: Eating and drinking Drink enough fluid to keep your pee (urine) pale yellow. This can help prevent cramps or spasms. Eat a healthy diet that includes a lot of nutrients to help your muscles work. A healthy diet includes fruits and vegetables, lean  protein, whole grains, and low-fat or nonfat dairy products. Managing pain and stiffness     Try to massage, stretch, and relax the affected muscle. Do this for a few minutes at a time. If told, put ice on the muscles. This may help if you are sore or have pain after a cramp or spasm. Put ice in a plastic bag. Place a towel between your skin and the bag. Leave the ice on for 20 minutes, 2-3 times a day. If told, apply heat to tight or tense muscles as often as told by your health care provider. Use the heat source that your provider recommends, such as a moist heat pack or a heating pad. Place a towel between your skin and the heat source. Leave the heat on for 20-30 minutes. If your skin turns bright red, remove the ice or heat right away to prevent skin damage. The risk of damage is higher if you cannot feel pain, heat, or cold. Take hot showers or baths to help relax tight muscles. General instructions If you are having cramps often, avoid intense exercise for a few days. Take over-the-counter and prescription medicines only as told by your  provider. Watch for any changes in your symptoms. Contact a health care provider if: Your cramps or spasms get more severe or happen more often. Your cramps or spasms do not get better over time. This information is not intended to replace advice given to you by your health care provider. Make sure you discuss any questions you have with your health care provider. Document Revised: 02/14/2022 Document Reviewed: 02/14/2022 Elsevier Patient Education  2024 [employer].

## 2023-11-01 NOTE — Progress Notes (Signed)
 C/O moderate back pain on a scale of 5. Can not tolerate sitting or laying. Slight relief while standing

## 2023-11-01 NOTE — Progress Notes (Signed)
 Established Patient Office Visit  Subjective   Patient ID: Kelly Reed, female    DOB: 1965-07-21  Age: 58 y.o. MRN: [id number]  Chief Complaint  Patient presents with  . Back Pain    Worsening this week has tried heat/ice/robaxin     57y/o married caucasian female established here for evaluation new left back/hip pain.  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  Interrupting sleep tried robaxin /flexeril /heat/ice.  Doing standing stretches only   Flexeril  did not help at all.  Tiger balm helping some today.  Denied loss of bowel/bladder control, saddle paresthesias or arm/leg weakness.  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  Took 2 robaxin  before bed last night and helped her to sleep but can't take them at work and function.  Spouse had to help her put on her socks and wears shoes she can slip into     Review of Systems  Constitutional:  Negative for chills and fever.  Respiratory:  Negative for cough and wheezing.   Gastrointestinal:  Negative for diarrhea and vomiting.  Genitourinary:  Negative for dysuria.  Musculoskeletal:  Positive for back pain and myalgias. Negative for falls.  Skin:  Negative for itching and rash.  Neurological:  Negative for dizziness and weakness.  Psychiatric/Behavioral:  The patient has insomnia.       Objective:     BP 129/77 (BP Location: Left Arm, Patient Position: Sitting, Cuff Size: Normal)   Pulse (!) 101    Physical Exam Vitals and nursing note reviewed.  Constitutional:      General: She is awake. She is not in acute distress.    Appearance: Normal appearance. She is well-developed, well-groomed and overweight. She is not ill-appearing, toxic-appearing or diaphoretic.  HENT:     Head: Normocephalic and atraumatic.     Jaw: There is normal jaw occlusion.     Salivary Glands: Right  salivary gland is not diffusely enlarged. Left salivary gland is not diffusely enlarged.     Right Ear: Hearing and external ear normal. No decreased hearing noted.     Left Ear: Hearing and external ear normal. No decreased hearing noted.     Nose: Nose normal. No congestion or rhinorrhea.     Mouth/Throat:     Lips: Pink. No lesions.     Mouth: Mucous membranes are moist. No oral lesions or angioedema.     Dentition: No gum lesions.     Tongue: No lesions. Tongue does not deviate from midline.     Palate: No mass and lesions.     Pharynx: Oropharynx is clear. Uvula midline. No oropharyngeal exudate or uvula swelling.     Tonsils: No tonsillar exudate.  Eyes:     General: Lids are normal. Vision grossly intact. Gaze aligned appropriately. Allergic shiner present. No scleral icterus.       Right eye: No discharge.        Left eye: No discharge.     Extraocular Movements: Extraocular movements intact.     Conjunctiva/sclera: Conjunctivae normal.     Pupils: Pupils are equal, round, and reactive to light.  Neck:     Trachea: Trachea and phonation normal.  Cardiovascular:     Rate and Rhythm: Normal rate and regular rhythm.     Pulses:          Radial pulses  are 2+ on the right side and 2+ on the left side.  Pulmonary:     Effort: Pulmonary effort is normal.     Breath sounds: Normal breath sounds and air entry. No stridor or transmitted upper airway sounds. No wheezing.     Comments: Spoke full sentences without difficulty; no cough observed in exam room Abdominal:     General: Abdomen is flat.  Musculoskeletal:     Right hand: Normal strength. Normal capillary refill.     Left hand: Normal strength. Normal capillary refill.     Cervical back: Normal range of motion and neck supple. No swelling, edema, deformity, erythema, signs of trauma, lacerations, rigidity, spasms, torticollis, tenderness or crepitus. No pain with movement or muscular tenderness. Normal range of motion.      Thoracic back: No swelling, edema, deformity, signs of trauma, lacerations, spasms or tenderness. Normal range of motion.     Lumbar back: Spasms, tenderness and bony tenderness present. No swelling, edema, deformity, signs of trauma or lacerations. Decreased range of motion. Negative right straight leg raise test and negative left straight leg raise test.     Comments: Left SI joint TTP and gluteus maximus/medius tight along with paraspinals left L1-2; cannot put left ankle to right knee  due to pain; slow supine to sitting AROM on exam table; pain with left leg raise same side without radiation; negative completely right; bilateral hamstrings are tight and AROM equal  Lymphadenopathy:     Head:     Right side of head: No submandibular or preauricular adenopathy.     Left side of head: No submandibular or preauricular adenopathy.     Cervical:     Right cervical: No superficial cervical adenopathy.    Left cervical: No superficial cervical adenopathy.  Skin:    General: Skin is warm and dry.     Capillary Refill: Capillary refill takes less than 2 seconds.     Coloration: Skin is not ashen, cyanotic, jaundiced, mottled, pale or sallow.     Findings: No abrasion, abscess, acne, bruising, burn, ecchymosis, erythema, signs of injury, laceration, lesion, petechiae, rash or wound.     Nails: There is no clubbing.     Comments: Anterior face/neck/hands visually inspected  Neurological:     General: No focal deficit present.     Mental Status: She is alert and oriented to person, place, and time. Mental status is at baseline.     GCS: GCS eye subscore is 4. GCS verbal subscore is 5. GCS motor subscore is 6.     Cranial Nerves: Cranial nerves 2-12 are intact. No cranial nerve deficit, dysarthria or facial asymmetry.     Sensory: Sensation is intact.     Motor: Motor function is intact. No weakness, tremor, atrophy, abnormal muscle tone or seizure activity.     Coordination: Coordination is intact.  Coordination normal.     Gait: Gait is intact. Gait normal.     Deep Tendon Reflexes:     Reflex Scores:      Brachioradialis reflexes are 2+ on the right side and 2+ on the left side.      Patellar reflexes are 2+ on the right side and 2+ on the left side.    Comments: In/out of chair and on/off exam table without difficulty; gait sure and steady in clinic; bilateral hand grasp equal 5/5  Psychiatric:        Attention and Perception: Attention and perception normal.  Mood and Affect: Mood and affect normal.        Speech: Speech normal.        Behavior: Behavior normal. Behavior is cooperative.        Thought Content: Thought content normal.        Cognition and Memory: Cognition and memory normal.        Judgment: Judgment normal.     No results found for any visits on 11/01/23.    The 10-year ASCVD risk score (Arnett DK, et al., 2019) is: 6.4%    Assessment & Plan:   Problem List Items Addressed This Visit   None Visit Diagnoses       Muscle spasm of back    -  Primary     Patient to take robaxin  2 tabs 500mg  at home when she arrives home from work and may take additional 1-2 tabs at bedtime.  Consider epsom salt soak after work in tub.  I recommended heat due to muscle spasms and stretches demonstrated in exam room.  Avoid alcohol intake and driving while taking robaxin  as drowsiness common side effect.  Slow position changes as medication also lower blood pressure.  Home stretches demonstrated to patient-gluteal/hip stretches seated in chair, knee to chest and rock side to side on back. Self massage or professional prn, foam roller use or tennis/racquetball.  Heat/cryotherapy 15 minutes QID prn.  Patient did not want trial thermacare today.  Consider physical therapy referral if no improvement with prescribed therapy from [facility] and/or chiropractic care.  Ensure ergonomics correct desk at work avoid repetitive motions if possible/holding phone/laptop in hand use desk/stand  and/or break up lifting items into smaller loads/weights.  Patient was instructed to rest, ice, and ROM exercises.  Activity as tolerated.   Follow up if symptoms persist or worsen especially if loss of bowel/bladder control, arm/leg weakness and/or saddle paresthesias.  Exitcare handout on muscle spasms and gluteal rehab exercises given to patient.  Patient verbalized agreement and understanding of treatment plan and had no further questions at this time.  P2:  Injury Prevention and Fitness.   Return if symptoms worsen or fail to improve.    Richardine Chancy, NP

## 2023-12-18 ENCOUNTER — Encounter: Payer: Self-pay | Admitting: Registered Nurse

## 2023-12-18 ENCOUNTER — Ambulatory Visit: Admitting: Registered Nurse

## 2023-12-18 VITALS — BP 151/86 | HR 88 | Temp 99.3°F

## 2023-12-18 DIAGNOSIS — H8110 Benign paroxysmal vertigo, unspecified ear: Secondary | ICD-10-CM

## 2023-12-18 DIAGNOSIS — J019 Acute sinusitis, unspecified: Secondary | ICD-10-CM

## 2023-12-18 MED ORDER — MECLIZINE HCL 25 MG PO TABS: 25.0000 mg | ORAL_TABLET | Freq: Four times a day (QID) | ORAL | 0 refills | Status: AC | PRN

## 2023-12-18 NOTE — Progress Notes (Signed)
 Established Patient Office Visit  Subjective   Patient ID: Kelly Reed, female    DOB: 10/18/1965  Age: 58 y.o. MRN: [id number]  Chief Complaint  Patient presents with   Sinusitis    Headache and teeth hurting mucous clear doesn't want steroids can't sleep    57y/o married caucasian established female here for evaluation sinus pain/congestion/post nasal drip allergy medication not helping and does not want prednisone .  Occasional dizziness with head position changes.  Mucous clear  Using flonase , xyzal , nasal saline and singulair .  Denied fever/body aches/chills.  Having trouble sleeping.  Woke up with nose bleed one day and had to get into shower to relieve congestion nasal/sinus.  Headache today tylenol  not helping "my blood pressure was elevated when RN Thersia Flax checked for appt so probably blood pressure related headache today"      Review of Systems  Constitutional:  Positive for malaise/fatigue. Negative for chills, diaphoresis and fever.  HENT:  Positive for congestion, nosebleeds and sinus pain. Negative for ear discharge, ear pain and hearing loss.   Eyes:  Negative for pain, discharge and redness.  Respiratory:  Positive for sputum production. Negative for cough, shortness of breath, wheezing and stridor.   Cardiovascular:  Negative for chest pain.  Gastrointestinal:  Negative for diarrhea and vomiting.  Genitourinary:  Negative for dysuria.  Musculoskeletal:  Negative for back pain, myalgias and neck pain.  Skin:  Negative for itching and rash.  Neurological:  Positive for headaches. Negative for dizziness, tingling, tremors, sensory change, speech change, focal weakness, seizures and weakness.  Endo/Heme/Allergies:  Positive for environmental allergies.  Psychiatric/Behavioral:  The patient has insomnia.       Objective:     BP (!) 151/86 (BP Location: Left Arm, Patient Position: Sitting, Cuff Size: Normal)   Pulse 88   Temp 99.3 F (37.4 C)   SpO2 96%     Physical Exam Vitals and nursing note reviewed.  Constitutional:      General: She is awake. She is not in acute distress.    Appearance: Normal appearance. She is well-developed, well-groomed and overweight. She is not ill-appearing, toxic-appearing or diaphoretic.  HENT:     Head: Normocephalic and atraumatic.     Jaw: There is normal jaw occlusion.     Salivary Glands: Right salivary gland is not diffusely enlarged or tender. Left salivary gland is not diffusely enlarged or tender.     Right Ear: Hearing and external ear normal. No decreased hearing noted. No laceration, drainage, swelling or tenderness. A middle ear effusion is present. There is no impacted cerumen. No foreign body. No mastoid tenderness. No PE tube. No hemotympanum. Tympanic membrane is not injected, scarred, perforated, erythematous, retracted or bulging.     Left Ear: Hearing and external ear normal. No decreased hearing noted. No laceration, drainage, swelling or tenderness. A middle ear effusion is present. There is no impacted cerumen. No foreign body. No mastoid tenderness. No PE tube. No hemotympanum. Tympanic membrane is not injected, scarred, perforated, erythematous, retracted or bulging.     Ears:     Comments: Bilateral TMs intact air fluid level clear; no debris bilateral auditory canals    Nose: Nasal tenderness and mucosal edema present. No laceration, congestion or rhinorrhea.     Right Nostril: No epistaxis or occlusion.     Left Nostril: No epistaxis or occlusion.     Right Turbinates: Enlarged and swollen. Not pale.     Left Turbinates: Enlarged and swollen. Not pale.  Right Sinus: Maxillary sinus tenderness and frontal sinus tenderness present.     Left Sinus: Maxillary sinus tenderness and frontal sinus tenderness present.     Comments: Maxillary and frontal sinuses ttp mild cobblestoning posterior pharynx; bilateral allergic shiners; nasal turbinates edema/erythema clear discharge     Mouth/Throat:     Lips: Pink. No lesions.     Mouth: Mucous membranes are moist. No oral lesions or angioedema.     Dentition: No gum lesions.     Tongue: No lesions. Tongue does not deviate from midline.     Palate: No mass and lesions.     Pharynx: Uvula midline. Pharyngeal swelling and postnasal drip present. No oropharyngeal exudate, posterior oropharyngeal erythema or uvula swelling.     Tonsils: No tonsillar exudate.  Eyes:     General: Lids are normal. Vision grossly intact. Gaze aligned appropriately. Allergic shiner present. No scleral icterus.       Right eye: No discharge.        Left eye: No discharge.     Extraocular Movements: Extraocular movements intact.     Conjunctiva/sclera: Conjunctivae normal.     Pupils: Pupils are equal, round, and reactive to light.  Neck:     Trachea: Trachea and phonation normal.  Cardiovascular:     Rate and Rhythm: Normal rate and regular rhythm.     Pulses:          Radial pulses are 2+ on the right side and 2+ on the left side.     Heart sounds: Normal heart sounds, S1 normal and S2 normal.  Pulmonary:     Effort: Pulmonary effort is normal.     Breath sounds: Normal breath sounds and air entry. No stridor or transmitted upper airway sounds. No decreased breath sounds, wheezing, rhonchi or rales.     Comments: Spoke full sentences without difficulty; no cough observed in exam room; BBS CTA Abdominal:     General: Abdomen is flat.  Musculoskeletal:        General: Normal range of motion.     Right hand: Normal strength. Normal capillary refill.     Left hand: Normal strength. Normal capillary refill.     Cervical back: Normal range of motion and neck supple. No swelling, edema, deformity, erythema, signs of trauma, lacerations, rigidity, spasms, torticollis, tenderness or crepitus. No pain with movement. Normal range of motion.     Thoracic back: No swelling, edema, deformity, signs of trauma, lacerations, spasms or tenderness. Normal  range of motion.  Lymphadenopathy:     Head:     Right side of head: No submental, submandibular, tonsillar, preauricular, posterior auricular or occipital adenopathy.     Left side of head: No submental, submandibular, tonsillar, preauricular, posterior auricular or occipital adenopathy.     Cervical: No cervical adenopathy.     Right cervical: No superficial, deep or posterior cervical adenopathy.    Left cervical: No superficial, deep or posterior cervical adenopathy.  Skin:    General: Skin is warm and dry.     Capillary Refill: Capillary refill takes less than 2 seconds.     Coloration: Skin is not ashen, cyanotic, jaundiced, mottled, pale or sallow.     Findings: No abrasion, abscess, acne, bruising, burn, ecchymosis, erythema, signs of injury, laceration, lesion, petechiae, rash or wound.  Neurological:     General: No focal deficit present.     Mental Status: She is alert and oriented to person, place, and time. Mental status is at baseline.  GCS: GCS eye subscore is 4. GCS verbal subscore is 5. GCS motor subscore is 6.     Cranial Nerves: Cranial nerves 2-12 are intact. No cranial nerve deficit, dysarthria or facial asymmetry.     Motor: Motor function is intact. No weakness, tremor, atrophy, abnormal muscle tone or seizure activity.     Coordination: Coordination is intact. Coordination normal.     Gait: Gait is intact. Gait normal.     Comments: In/out of chair and on/off exam table without difficulty; gait sure and steady in clinic; bilateral hand grasp equal 5/5  Psychiatric:        Attention and Perception: Attention and perception normal.        Mood and Affect: Mood and affect normal.        Speech: Speech normal.        Behavior: Behavior normal. Behavior is cooperative.        Thought Content: Thought content normal.        Cognition and Memory: Cognition and memory normal.        Judgment: Judgment normal.    Home covid test results negative  No results found  for any visits on 12/18/23.    The 10-year ASCVD risk score (Arnett DK, et al., 2019) is: 8.7%    Assessment & Plan:   Problem List Items Addressed This Visit   None Visit Diagnoses       Benign paroxysmal positional vertigo, unspecified laterality    -  Primary     Acute rhinosinusitis         Given 1 free [place]  gov[place] covid test to complete prior to returning to [facility] Stop xyzal  and claritin trial meclizine  25mg  po q6h prn rhinitis/room spinning.  Discussed with patient otic effusion probably causing vertigo but could also be age. Meclizine  25mg  po prn helpful in the past. Supportive treatment may take up to 4 doses meclizine  per day max 100mg  per 24 hours. Discussed signs/symptoms stroke.  Someone to drive her home recommended not driving during vertigo episodes. Follow up if aphasia, dysphasia, visual changes, weakness, fall, worst headache of life, incoordination, fever, ear discharge. Consider ENT evaluation/follow up with PCM if worsening symptoms not controlled with meclizine .  [employer]care handouts viral uri, vertigo  Discussed other viruses have been circulating in community the past couple of weeks rare covid other adenovirus causing rhinitis/congestion/post nasal drip 10-14 days afebrile no GI upset.  Discussed if meclizine  not helpful can trial carbinoxamine maleate 4mg  po BID.   Patient verbalized understanding of information/agreed with plan of care and had no further questions at this time.    Continue singulair  10mg  po at bedtime  holding sudafed due to elevated blood pressure.  Patient may use normal saline nasal spray 2 sprays each nostril q2h wa as needed. Flonase  nasal 50mcg 1 spray each nostril BID.  Patient denied personal or family history of ENT cancer.  Avoid triggers if possible.  Shower prior to bedtime if exposed to triggers.  If allergic dust/dust mites recommend mattress/pillow covers/encasements; washing linens, vacuuming, sweeping, dusting weekly.  Call or return  to clinic as needed if these symptoms worsen or fail to improve as anticipated. Patient verbalized understanding of instructions, agreed with plan of care and had no further questions at this time.  P2:  Avoidance and hand washing.   Return if symptoms worsen or fail to improve.    Richardine Chancy, NP

## 2023-12-18 NOTE — Progress Notes (Signed)
 Possible sinus infection, covid test done, results are negative

## 2023-12-18 NOTE — Patient Instructions (Signed)
 Hold Xyzal  and claritin when taking meclizine  every 6 hours  How to Perform a Sinus Rinse A sinus rinse is a home treatment that is used to rinse your sinuses with a germ-free (sterile) mixture of salt and water (saline solution). Sinuses are air-filled spaces in your skull that are behind the bones of your face and forehead. They open into your nasal cavity. A sinus rinse can help to clear mucus, dirt, dust, or pollen from your nasal cavity. You may do a sinus rinse when you have a cold, a virus, nasal allergy symptoms, a sinus infection, or stuffiness in your nose or sinuses. What are the risks? A sinus rinse is generally safe and effective. However, there are a few risks, which include: A burning sensation in your sinuses. This may happen if you do not make the saline solution as directed. Be sure to follow all directions when making the saline solution. Nasal irritation. Infection. This may be from unclean supplies or from contaminated water. Infection from contaminated water is rare, but possible. Do not do a sinus rinse if you have had ear or nasal surgery, ear infection, or plugged ears, unless recommended by your health care provider. Supplies needed: Saline solution or powder. Distilled or sterile water to mix with saline powder. You may use boiled and cooled tap water. Boil tap water for 5 minutes; cool until it is lukewarm. Use within 24 hours. Do not use regular tap water to mix with the saline solution. Neti pot or nasal rinse bottle. These supplies release the saline solution into your nose and through your sinuses. Neti pots and nasal rinse bottles can be purchased at [employer], a health food store, or online. How to perform a sinus rinse  Wash your hands with soap and water for at least 20 seconds. If soap and water are not available, use hand sanitizer. Wash your device according to the directions that came with the product and then dry it. Use the solution that comes  with your product or one that is sold separately in stores. Follow the mixing directions on the package to mix with sterile or distilled water. Fill the device with the amount of saline solution noted in the device instructions. Stand by a sink and tilt your head sideways over the sink. Place the spout of the device in your upper nostril (the one closer to the ceiling). Gently pour or squeeze the saline solution into your nasal cavity. The liquid should drain out from the lower nostril if you are not too congested. While rinsing, breathe through your open mouth. Gently blow your nose to clear any mucus and rinse solution. Blowing too hard may cause ear pain. Turn your head in the other direction and repeat in your other nostril. Clean and rinse your device with clean water and then air-dry it. Talk with your health care provider or pharmacist if you have questions about how to do a sinus rinse. Summary A sinus rinse is a home treatment that is used to rinse your sinuses with a sterile mixture of salt and water (saline solution). You may do a sinus rinse when you have a cold, a virus, nasal allergy symptoms, a sinus infection, or stuffiness in your nose or sinuses. A sinus rinse is generally safe and effective. Follow all instructions carefully. This information is not intended to replace advice given to you by your health care provider. Make sure you discuss any questions you have with your health care provider. Document Revised: 12/13/2020  Document Reviewed: 12/13/2020 Elsevier Patient Education  2024 [employer]Sinus Pain  Sinus pain may occur when your sinuses become clogged or swollen. Sinuses are air-filled spaces in your skull that are behind the bones of your face and forehead. Sinus pain can range from mild to severe. What are the causes? Sinus pain can result from various conditions that affect the sinuses. Common causes include: Colds. Sinus infections. Allergies. What are the  signs or symptoms? The main symptom of this condition is pain or pressure in your face, forehead, ears, or upper teeth. People who have sinus pain often have other symptoms, such as: Congested or runny nose. Fever. Inability to smell. Headache. Weather changes can make symptoms worse. How is this diagnosed? Your health care provider will diagnose this condition based on your symptoms and a physical exam. If you have pain that keeps coming back or does not go away, your health care provider may recommend more testing. This may include: Imaging tests, such as a CT scan or MRI, to check for problems with your sinuses. Examination of your sinuses using a thin tool with a camera that is inserted through your nose (endoscopy). How is this treated? Treatment for this condition depends on the cause. Sinus pain that is caused by a sinus infection may be treated with antibiotic medicine. Sinus pain that is caused by congestion may be helped by rinsing out (flushing) the nose and sinuses with saline solution. Sinus pain that is caused by allergies may be helped by allergy medicines (antihistamines) and medicated nasal sprays. Sinus surgery may be needed in some cases if other treatments do not help. Follow these instructions at home: General instructions If directed: Apply a warm, moist washcloth to your face to help relieve pain. Use a nasal saline wash. Follow the directions on the bottle or box. Hydrate and humidify Drink enough water to keep your urine clear or pale yellow. Staying hydrated will help to thin your mucus. Use a humidifier if your home is dry. Inhale steam for 10-15 minutes, 3-4 times a day or as told by your health care provider. You can do this in the bathroom while a hot shower is running. Limit your exposure to cool or dry air. Medicines  Take over-the-counter and prescription medicines only as told by your health care provider. If you were prescribed an antibiotic medicine,  take it as told by your health care provider. Do not stop taking the antibiotic even if you start to feel better. If you have congestion, use a nasal spray to help lessen pressure. Contact a health care provider if: You have sinus pain more than one time a week. You have sensitivity to light or sound. You develop a fever. You feel nauseous or you vomit. Your sinus pain or headache does not get better with treatment. Get help right away if: You have vision problems. You have sudden, severe pain in your face or head. You have a seizure. You are confused. You have a stiff neck. Summary Sinus pain occurs when your sinuses become clogged or swollen. Sinus pain can result from various conditions that affect the sinuses, such as a cold, a sinus infection, or an allergy. Treatment for this condition depends on the cause. It may include medicine, such as antibiotics or antihistamines. This information is not intended to replace advice given to you by your health care provider. Make sure you discuss any questions you have with your health care provider. Document Revised: 05/29/2021 Document Reviewed: 05/29/2021 Elsevier Patient Education  2024 [employer].Benign Positional Vertigo Vertigo is the feeling that you or your surroundings are moving when they are not. Benign positional vertigo is the most common form of vertigo. This is usually a harmless condition (benign). This condition is positional. This means that symptoms are triggered by certain movements and positions. This condition can be dangerous if it occurs while you are doing something that could cause harm to yourself or others. This includes activities such as driving or operating machinery. What are the causes? The inner ear has fluid-filled canals that help your brain sense movement and balance. When the fluid moves, the brain receives messages about your body's position. With benign positional vertigo, calcium  crystals in the inner  ear break free and disturb the inner ear area. This causes your brain to receive confusing messages about your body's position. What increases the risk? You are more likely to develop this condition if: You are a woman. You are 39 years of age or older. You have recently had a head injury. You have an inner ear disease. What are the signs or symptoms? Symptoms of this condition usually happen when you move your head or your eyes in different directions. Symptoms may start suddenly and usually last for less than a minute. They include: Loss of balance and falling. Feeling like you are spinning or moving. Feeling like your surroundings are spinning or moving. Nausea and vomiting. Blurred vision. Dizziness. Involuntary eye movement (nystagmus). Symptoms can be mild and cause only minor problems, or they can be severe and interfere with daily life. Episodes of benign positional vertigo may return (recur) over time. Symptoms may also improve over time. How is this diagnosed? This condition may be diagnosed based on: Your medical history. A physical exam of the head, neck, and ears. Positional tests to check for or stimulate vertigo. You may be asked to turn your head and change positions, such as going from sitting to lying down. A health care provider will watch for symptoms of vertigo. You may be referred to a health care provider who specializes in ear, nose, and throat problems (ENT or otolaryngologist) or a provider who specializes in disorders of the nervous system (neurologist). How is this treated?  This condition may be treated in a session in which your health care provider moves your head in specific positions to help the displaced crystals in your inner ear move. Treatment for this condition may take several sessions. Surgery may be needed in severe cases, but this is rare. In some cases, benign positional vertigo may resolve on its own in 2-4 weeks. Follow these instructions at  home: Safety Move slowly. Avoid sudden body or head movements or certain positions, as told by your health care provider. Avoid driving or operating machinery until your health care provider says it is safe. Avoid doing any tasks that would be dangerous to you or others if vertigo occurs. If you have trouble walking or keeping your balance, try using a cane for stability. If you feel dizzy or unstable, sit down right away. Return to your normal activities as told by your health care provider. Ask your health care provider what activities are safe for you. General instructions Take over-the-counter and prescription medicines only as told by your health care provider. Drink enough fluid to keep your urine pale yellow. Keep all follow-up visits. This is important. Contact a health care provider if: You have a fever. Your condition gets worse or you develop new symptoms. Your family or friends notice  any behavioral changes. You have nausea or vomiting that gets worse. You have numbness or a prickling and tingling sensation. Get help right away if you: Have difficulty speaking or moving. Are always dizzy or faint. Develop severe headaches. Have weakness in your legs or arms. Have changes in your hearing or vision. Develop a stiff neck. Develop sensitivity to light. These symptoms may represent a serious problem that is an emergency. Do not wait to see if the symptoms will go away. Get medical help right away. Call your local emergency services (911 in the [place] Do not drive yourself to the hospital. Summary Vertigo is the feeling that you or your surroundings are moving when they are not. Benign positional vertigo is the most common form of vertigo. This condition is caused by calcium  crystals in the inner ear that become displaced. This causes a disturbance in an area of the inner ear that helps your brain sense movement and balance. Symptoms include loss of balance and falling, feeling  that you or your surroundings are moving, nausea and vomiting, and blurred vision. This condition can be diagnosed based on symptoms, a physical exam, and positional tests. Follow safety instructions as told by your health care provider and keep all follow-up visits. This is important. This information is not intended to replace advice given to you by your health care provider. Make sure you discuss any questions you have with your health care provider. Document Revised: 01/15/2023 Document Reviewed: 01/15/2023 Elsevier Patient Education  2024 [employer].

## 2023-12-25 ENCOUNTER — Other Ambulatory Visit

## 2023-12-25 VITALS — BP 152/82 | Ht 69.0 in | Wt 198.0 lb

## 2023-12-25 DIAGNOSIS — Z0000 Encounter for general adult medical examination without abnormal findings: Secondary | ICD-10-CM

## 2023-12-25 NOTE — Progress Notes (Signed)
Be Well!

## 2023-12-26 ENCOUNTER — Ambulatory Visit: Payer: Self-pay | Admitting: Registered Nurse

## 2023-12-26 DIAGNOSIS — E119 Type 2 diabetes mellitus without complications: Secondary | ICD-10-CM

## 2023-12-26 LAB — CMP12+LP+TP+TSH+6AC+CBC/D/PLT
ALT: 19 IU/L (ref 0–32)
AST: 18 IU/L (ref 0–40)
Albumin: 4.8 g/dL (ref 3.8–4.9)
Alkaline Phosphatase: 67 IU/L (ref 44–121)
BUN/Creatinine Ratio: 15 (ref 9–23)
BUN: 11 mg/dL (ref 6–24)
Basophils Absolute: 0.1 10*3/uL (ref 0.0–0.2)
Basos: 1 %
Bilirubin Total: 0.4 mg/dL (ref 0.0–1.2)
Calcium: 9.5 mg/dL (ref 8.7–10.2)
Chloride: 102 mmol/L (ref 96–106)
Chol/HDL Ratio: 2.7 ratio (ref 0.0–4.4)
Cholesterol, Total: 184 mg/dL (ref 100–199)
Creatinine, Ser: 0.71 mg/dL (ref 0.57–1.00)
EOS (ABSOLUTE): 0.2 10*3/uL (ref 0.0–0.4)
Eos: 3 %
Estimated CHD Risk: <0.5 times avg. (ref 0.0–1.0)
Free Thyroxine Index: 2.6 (ref 1.2–4.9)
GGT: 14 IU/L (ref 0–60)
Globulin, Total: 2.1 g/dL (ref 1.5–4.5)
Glucose: 119 mg/dL — ABNORMAL HIGH (ref 70–99)
HDL: 67 mg/dL (ref >39)
Hematocrit: 44 % (ref 34.0–46.6)
Hemoglobin: 14 g/dL (ref 11.1–15.9)
Immature Grans (Abs): 0.1 10*3/uL (ref 0.0–0.1)
Immature Granulocytes: 1 %
Iron: 94 ug/dL (ref 27–159)
LDH: 191 IU/L (ref 119–226)
LDL Chol Calc (NIH): 92 mg/dL (ref 0–99)
Lymphocytes Absolute: 2 10*3/uL (ref 0.7–3.1)
Lymphs: 29 %
MCH: 29.6 pg (ref 26.6–33.0)
MCHC: 31.8 g/dL (ref 31.5–35.7)
MCV: 93 fL (ref 79–97)
Monocytes Absolute: 0.7 10*3/uL (ref 0.1–0.9)
Monocytes: 10 %
Neutrophils Absolute: 3.9 10*3/uL (ref 1.4–7.0)
Neutrophils: 56 %
Phosphorus: 3.3 mg/dL (ref 3.0–4.3)
Platelets: 282 10*3/uL (ref 150–450)
Potassium: 4.2 mmol/L (ref 3.5–5.2)
RBC: 4.73 x10E6/uL (ref 3.77–5.28)
RDW: 14.3 % (ref 11.7–15.4)
Sodium: 139 mmol/L (ref 134–144)
T3 Uptake Ratio: 26 % (ref 24–39)
T4, Total: 10.1 ug/dL (ref 4.5–12.0)
TSH: 1.27 u[IU]/mL (ref 0.450–4.500)
Total Protein: 6.9 g/dL (ref 6.0–8.5)
Triglycerides: 145 mg/dL (ref 0–149)
Uric Acid: 3.8 mg/dL (ref 3.0–7.2)
VLDL Cholesterol Cal: 25 mg/dL (ref 5–40)
WBC: 6.9 10*3/uL (ref 3.4–10.8)
eGFR: 98 mL/min/{1.73_m2} (ref >59)

## 2023-12-26 LAB — HEMOGLOBIN A1C
Est. average glucose Bld gHb Est-mCnc: 140 mg/dL
Hgb A1c MFr Bld: 6.5 % — ABNORMAL HIGH (ref 4.8–5.6)

## 2024-01-22 ENCOUNTER — Ambulatory Visit: Admitting: Registered Nurse

## 2024-01-31 ENCOUNTER — Other Ambulatory Visit: Payer: Self-pay | Admitting: Internal Medicine

## 2024-02-01 ENCOUNTER — Ambulatory Visit: Payer: No Typology Code available for payment source | Admitting: Internal Medicine

## 2024-02-08 ENCOUNTER — Ambulatory Visit: Admitting: Internal Medicine

## 2024-02-08 ENCOUNTER — Encounter: Payer: Self-pay | Admitting: Internal Medicine

## 2024-02-08 VITALS — BP 122/80 | HR 78 | Temp 98.0°F | Ht 69.0 in | Wt 194.0 lb

## 2024-02-08 DIAGNOSIS — Z0001 Encounter for general adult medical examination with abnormal findings: Secondary | ICD-10-CM

## 2024-02-08 DIAGNOSIS — E1169 Type 2 diabetes mellitus with other specified complication: Secondary | ICD-10-CM

## 2024-02-08 DIAGNOSIS — Z23 Encounter for immunization: Secondary | ICD-10-CM | POA: Diagnosis not present

## 2024-02-08 DIAGNOSIS — F419 Anxiety disorder, unspecified: Secondary | ICD-10-CM

## 2024-02-08 DIAGNOSIS — R5383 Other fatigue: Secondary | ICD-10-CM | POA: Diagnosis not present

## 2024-02-08 DIAGNOSIS — Z7984 Long term (current) use of oral hypoglycemic drugs: Secondary | ICD-10-CM

## 2024-02-08 DIAGNOSIS — I10 Essential (primary) hypertension: Secondary | ICD-10-CM

## 2024-02-08 DIAGNOSIS — E118 Type 2 diabetes mellitus with unspecified complications: Secondary | ICD-10-CM

## 2024-02-08 DIAGNOSIS — E785 Hyperlipidemia, unspecified: Secondary | ICD-10-CM

## 2024-02-08 DIAGNOSIS — F32A Depression, unspecified: Secondary | ICD-10-CM

## 2024-02-08 DIAGNOSIS — J4530 Mild persistent asthma, uncomplicated: Secondary | ICD-10-CM

## 2024-02-08 LAB — MICROALBUMIN / CREATININE URINE RATIO
Creatinine,U: 49.9 mg/dL
Microalb Creat Ratio: 18.4 mg/g (ref 0.0–30.0)
Microalb, Ur: 0.9 mg/dL (ref 0.0–1.9)

## 2024-02-08 NOTE — Assessment & Plan Note (Signed)
 Reviewed recent labs, foot exam done, checking UACR. Continue metformin  and rybelsus . Is on statin and ARB.

## 2024-02-08 NOTE — Assessment & Plan Note (Signed)
 Flu shot yearly. Pneumonia given 20 today. Shingrix  complete. Tetanus up to date. Colonoscopy up to date. Mammogram up to date, pap smear up to date with gyn. Counseled about sun safety and mole surveillance. Counseled about the dangers of distracted driving. Given 10 year screening recommendations.

## 2024-02-08 NOTE — Progress Notes (Signed)
   Subjective:   Patient ID: Kelly Reed, female    DOB: 14-Apr-1966, 58 y.o.   MRN: [id number]  The patient is here for physical. Pertinent topics discussed: Discussed the use of AI scribe software for clinical note transcription with the patient, who gave verbal consent to proceed.  History of Present Illness She has a history of gastroesophageal reflux disease (GERD) and notes that certain foods, like boiled eggs, can trigger indigestion. She manages her symptoms by avoiding known triggers.  She is experiencing symptoms of carpal tunnel syndrome, particularly in her right hand, which includes numbness and swelling that wakes her up at night. She uses a brace during the day at work but finds it uncomfortable to wear at night. She has declined steroid injections for treatment.  She reports difficulty sleeping, often waking up at 3 AM and struggling to fall back asleep. She has tried melatonin but finds it ineffective. She reports that her sleep issues may be related to a racing mind and notes that her mother had similar experiences.   PMH, [facility], social history reviewed and updated  Review of Systems  Constitutional: Negative.   HENT: Negative.    Eyes: Negative.   Respiratory:  Negative for cough, chest tightness and shortness of breath.   Cardiovascular:  Negative for chest pain, palpitations and leg swelling.  Gastrointestinal:  Negative for abdominal distention, abdominal pain, constipation, diarrhea, nausea and vomiting.  Musculoskeletal: Negative.   Skin: Negative.   Neurological:  Positive for numbness.  Psychiatric/Behavioral: Negative.      Objective:  Physical Exam Constitutional:      Appearance: She is well-developed.  HENT:     Head: Normocephalic and atraumatic.  Cardiovascular:     Rate and Rhythm: Normal rate and regular rhythm.  Pulmonary:     Effort: Pulmonary effort is normal. No respiratory distress.     Breath sounds: Normal breath sounds. No wheezing or  rales.  Abdominal:     General: Bowel sounds are normal. There is no distension.     Palpations: Abdomen is soft.     Tenderness: There is no abdominal tenderness. There is no rebound.  Musculoskeletal:     Cervical back: Normal range of motion.  Skin:    General: Skin is warm and dry.  Neurological:     Mental Status: She is alert and oriented to person, place, and time.     Coordination: Coordination normal.     Vitals:   02/08/24 0756  BP: 122/80  Pulse: 78  Temp: 98 F (36.7 C)  TempSrc: Oral  SpO2: 99%  Weight: 194 lb (88 kg)  Height: 5' 9 (1.753 m)    Assessment & Plan:  Prevnar 20 given at visit

## 2024-02-08 NOTE — Assessment & Plan Note (Signed)
 Is taking wellbutrin  300 mg daily and this is controlling mood well. Continue.
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
## 2024-02-08 NOTE — Assessment & Plan Note (Signed)
 BP at goal on losartan  and recent labs at goal. Continue.

## 2024-02-08 NOTE — Assessment & Plan Note (Signed)
 Recent lipid panel at goal continue crestor  5 mg twice a week.

## 2024-02-08 NOTE — Assessment & Plan Note (Signed)
 Still struggling with this and unchanged.

## 2024-02-11 ENCOUNTER — Ambulatory Visit: Payer: Self-pay | Admitting: Internal Medicine

## 2024-03-22 ENCOUNTER — Other Ambulatory Visit: Payer: Self-pay | Admitting: Internal Medicine

## 2024-04-17 ENCOUNTER — Encounter: Payer: Self-pay | Admitting: Registered Nurse

## 2024-04-17 ENCOUNTER — Ambulatory Visit: Admitting: Registered Nurse

## 2024-04-17 ENCOUNTER — Other Ambulatory Visit: Payer: Self-pay | Admitting: Registered Nurse

## 2024-04-17 ENCOUNTER — Other Ambulatory Visit: Payer: Self-pay | Admitting: General Practice

## 2024-04-17 VITALS — BP 158/84 | HR 94 | Temp 98.1°F | Resp 16

## 2024-04-17 DIAGNOSIS — J301 Allergic rhinitis due to pollen: Secondary | ICD-10-CM

## 2024-04-17 DIAGNOSIS — R790 Abnormal level of blood mineral: Secondary | ICD-10-CM

## 2024-04-17 DIAGNOSIS — I10 Essential (primary) hypertension: Secondary | ICD-10-CM
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
## 2024-04-17 NOTE — Patient Instructions (Signed)
Asthma Attack Prevention, Adult Although you may not be able to change the fact that you have asthma, you can take actions to prevent episodes of asthma (asthma attacks). How can this condition affect me? Asthma attacks (flare ups) can cause trouble breathing, high-pitched whistling sounds when you breathe, most often when you breathe out (wheeze), and coughing. They may keep you from doing activities you like to do. What can increase my risk? Coming into contact with things that cause asthma symptoms (asthma triggers) can put you at risk for an asthma attack. Common asthma triggers include: Things you are allergic to (allergens), such as: Dust mite and cockroach droppings. Pet dander. Mold. Pollen from trees and grasses. Food allergies. This might be a specific food or added chemicals called sulfites. Irritants, such as: Weather changes including very cold, dry, or humid air. Smoke. This includes campfire smoke, air pollution, and tobacco smoke. Strong odors from aerosol sprays and fumes from perfume, candles, and household cleaners. Other triggers, such as: Certain medicines. This includes NSAIDs, such as ibuprofen and aspirin. Viral respiratory infections (colds), including runny nose (rhinitis) or infection in the sinuses (sinusitis). Activity, including exercise, laughing, or crying. Not using inhaled medicines (corticosteroids) as told. What actions can I take to prevent an asthma attack? Stay healthy. Stay up to date on all immunizations as told by your health care provider, including the yearly flu (influenza) vaccine and pneumonia vaccine. Many asthma attacks can be prevented by carefully following your written asthma action plan. Follow your asthma action plan Work with your health care provider to create a written asthma action plan. This plan should include: A list of your asthma triggers and how to avoid or reduce them. A list of symptoms that you may have during an asthma  attack. Information about which medicine to take, when to take the medicine, and how much of the medicine to take. Information to help you understand your peak flow measurements. Daily actions that you can take to prevent (control) your asthma symptoms. Contact information for your health care providers. If you have an asthma attack, act quickly. Follow the emergency steps on your written asthma action plan. This may prevent you from needing to go to the hospital. Monitor your asthma. To do this: Use your peak flow meter every morning and every evening for 2-3 weeks or as told by your health care provider. Record the results in a journal. A drop in your peak flow numbers on one or more days may mean that you are starting to have an asthma attack, even if you are not having symptoms. When you have asthma symptoms, write them down in a journal. Write down in your journal how often you need to use your fast-acting rescue inhaler. If you are using your rescue inhaler more often, it may mean that your asthma is not under control. Talk with your health care provider about adjusting your asthma treatment plan to help you prevent future asthma attacks and gain better control of your condition.  Lifestyle Avoid or reduce contact with known outdoor allergens by staying indoors, keeping windows closed, and using air conditioning when pollen and mold counts are high. Do not use any products that contain nicotine or tobacco. These products include cigarettes, chewing tobacco, and vaping devices, such as e-cigarettes. If you need help quitting, ask your health care provider. If you are overweight, consider a weight-loss plan. Find ways to cope with stress and your feelings, such as mindfulness, relaxation, or breathing exercises. Ask your  health care provider if a breathing exercise program (pulmonary rehabilitation) may be helpful to control symptoms and improve your quality of life. Medicines  Take  over-the-counter and prescription medicines only as told by your health care provider. Do not stop taking your medicine and do not take less medicine even if you start to feel better. Let your health care provider know: How often you use your rescue inhaler. How often you have symptoms when you are taking your regular medicines. If you wake up at night because of asthma symptoms. If you have more trouble with your breathing when you exercise. Activity Do your normal activities as told by your health care provider. Ask your health care provider what activities are safe for you. Some people have asthma symptoms or more asthma symptoms when they exercise. This is called exercise-induced bronchoconstriction (EIB). If you have this problem, talk with your health care provider about how to manage EIB. Some tips to follow include: Use your fast-acting inhaler before exercise. Exercise indoors if it is very cold, humid, or the pollen and mold counts are high. Warm up and cool down before and after exercise. Stop exercising right away if your asthma symptoms start or get worse. Where to find more information Asthma and Allergy Foundation of America: www.aafa.org Centers for Disease Control and Prevention: FootballExhibition.com.br American Lung Association: www.lung.org National Heart, Lung, and Blood Institute: PopSteam.is World Health Organization: https://castaneda-walker.com/ Get help right away if: You have followed your written asthma action plan and your symptoms are not improving. Summary Asthma attacks (flare ups) can cause trouble breathing, high-pitched whistling sounds when you breathe, most often when you breathe out (wheeze), and coughing. Work with your health care provider to create a written asthma action plan. Do not stop taking your medicine and do not take less medicine even if you are feeling better. Do not use any products that contain nicotine or tobacco. These products include cigarettes, chewing  tobacco, and vaping devices, such as e-cigarettes. If you need help quitting, ask your health care provider. This information is not intended to replace advice given to you by your health care provider. Make sure you discuss any questions you have with your health care provider. Document Revised: 12/22/2020 Document Reviewed: 12/22/2020 Elsevier Patient Education  2024 [employer].

## 2024-04-18 ENCOUNTER — Ambulatory Visit: Payer: Self-pay | Admitting: Registered Nurse

## 2024-04-18 LAB — FERRITIN: Ferritin: 20 ng/mL (ref 15–150)

## 2024-04-28 ENCOUNTER — Ambulatory Visit: Admitting: General Practice

## 2024-04-28 ENCOUNTER — Encounter: Payer: Self-pay | Admitting: Registered Nurse

## 2024-04-28 VITALS — BP 144/81 | HR 92 | Temp 97.1°F | Resp 18

## 2024-04-28 DIAGNOSIS — A084 Viral intestinal infection, unspecified: Secondary | ICD-10-CM

## 2024-04-28 DIAGNOSIS — R112 Nausea with vomiting, unspecified: Secondary | ICD-10-CM | POA: Insufficient documentation

## 2024-04-28 NOTE — Progress Notes (Signed)
 Employee presents to the clinic this morning with nausea, vomited at home before coming to work and feels like diarrhea will hit at any moment! This started yesterday and hasn't gotten any better. Also states not having anything to eat today, took meds, not sure what my sugar will do. Denies any abdominal pain.  VSS, Systolic B/P slightly elevated, likely due to dehydration. CBG taken =118. Upon assessment, face is pale, no wretching at the moment. Discussed eating ice chips slowly before trying anything to drink. Advised sipping water when able to hold liquids down, sip broth or clear soups, very few sugar-free items due to preservatives, and Pedialyte.  Employee will be leaving work today, clinic team will follow up and reassess later today or tomorrow.

## 2024-04-29 NOTE — Telephone Encounter (Signed)
 Spoke with patient via telephone . Stated last diarrhea last night along with some chills.  Able to tolerate po intake low sugar gatorade, toast and banana so far today.  Some fatigue.  Stated stool soft brown.  Denied new or worsening symptoms.  I think I should be able to return to work tomorrow.  Still having some fatigue.  Discussed to send my chart message or email [email or web address] if symptoms occur again prior to returning to work tomorrow.  I have recommended clear fluids and advanced to soft as tolerated.  Avoid dairy, spicy and fried foods until diarrhea resolves. Avoid dehydration drink noncaffeinated beverages (water, ginger ale, soup broth, popsicles, no sugar added gatorade/powerade) to urinate every 2-4 hours pale yellow urine.  It is easy to become dehydrated when having diarrhea along with electrolyte imbalances.    Patient given exitcare handouts on diarrhea and foods to relieve diarrhea. Medications as directed.  Return to the clinic if symptoms persist or worsen; I have alerted the patient to call if high fever, dehydration, marked weakness, fainting, increased abdominal pain, blood in stool or vomit. Patient given work excuse note for 24 hours. Patient verbalized agreement and understanding of treatment plan and had no further questions at this time.  HR team and supervisor notified of excused absence extension 24 hours per communicable disease policy. P2: Hand washing and fitness

## 2024-04-30 NOTE — Telephone Encounter (Signed)
 Spoke with patient via telephone stated at work today a little fatigued denied vomiting/diarrhea.  Nausea was worse this am eased off as day progressed.  Not eating her usual amount of food but staying hydrated and tolerating po intake.  Discussed RN Karene in clinic until 5pm today if she requires further assistance and I will be in clinic tomorrow 11a-2p  Patient A&Ox3 verbalized understanding information/instructions and had no further questions at that time.

## 2024-05-22 ENCOUNTER — Ambulatory Visit: Admitting: Registered Nurse

## 2024-05-22 VITALS — BP 133/67 | HR 92 | Temp 98.4°F | Resp 18

## 2024-05-22 DIAGNOSIS — R21 Rash and other nonspecific skin eruption: Secondary | ICD-10-CM | POA: Insufficient documentation

## 2024-05-22 DIAGNOSIS — K219 Gastro-esophageal reflux disease without esophagitis: Secondary | ICD-10-CM

## 2024-05-22 MED ORDER — FAMOTIDINE 20 MG PO TABS: 20.0000 mg | ORAL_TABLET | Freq: Two times a day (BID) | ORAL | 0 refills | Status: AC

## 2024-05-22 NOTE — Progress Notes (Signed)
 Established Patient Office Visit  Subjective   Patient ID: Kelly Reed, female    DOB: May 18, 1966  Age: 58 y.o. MRN: [id number]  Chief Complaint  Patient presents with   Gastroesophageal Reflux    Hiatal Hernia    58y/o established caucasian female here for evaluation worsening GERD/hiatal hernia symptoms food getting stuck more often and GERD feels like when I was pregnant.  TUMS and  Omeprazole  not helping at all  Occasionally taking carafate  but not daily.  Due to rybelsus  use has been eating smaller meals since Mar 2024  Some nausea denied vomiting/diarrhea/abdomen pain/fever/chills.      Review of Systems  Constitutional:  Negative for chills, diaphoresis and fever.  Respiratory:  Negative for cough.   Gastrointestinal:  Positive for heartburn and nausea. Negative for abdominal pain, constipation, diarrhea and vomiting.      Objective:     BP 133/67 (BP Location: Left Arm, Patient Position: Sitting, Cuff Size: Normal)   Pulse 92   Temp 98.4 F (36.9 C) (Tympanic)   Resp 18   SpO2 98%    Physical Exam Vitals and nursing note reviewed.  Constitutional:      General: She is awake. She is not in acute distress.    Appearance: Normal appearance. She is well-developed, well-groomed and normal weight. She is not ill-appearing, toxic-appearing or diaphoretic.  HENT:     Head: Normocephalic and atraumatic.     Jaw: There is normal jaw occlusion.     Salivary Glands: Right salivary gland is not diffusely enlarged. Left salivary gland is not diffusely enlarged.     Right Ear: Hearing and external ear normal. No decreased hearing noted. No laceration, drainage or swelling.     Left Ear: Hearing and external ear normal. No decreased hearing noted. No laceration, drainage or swelling.     Nose: Nose normal. No congestion or rhinorrhea.     Mouth/Throat:     Lips: Pink. No lesions.     Mouth: Mucous membranes are moist. No oral lesions or angioedema.     Pharynx:  Oropharynx is clear. Uvula midline.  Eyes:     General: Lids are normal. Vision grossly intact. Gaze aligned appropriately. Allergic shiner present. No scleral icterus.       Right eye: No discharge.        Left eye: No discharge.     Extraocular Movements: Extraocular movements intact.     Conjunctiva/sclera: Conjunctivae normal.     Pupils: Pupils are equal, round, and reactive to light.  Neck:     Trachea: Trachea and phonation normal.  Cardiovascular:     Rate and Rhythm: Normal rate and regular rhythm.     Pulses: Normal pulses.          Radial pulses are 2+ on the right side and 2+ on the left side.  Pulmonary:     Effort: Pulmonary effort is normal. No respiratory distress.     Breath sounds: Normal breath sounds and air entry. No stridor or transmitted upper airway sounds. No wheezing.     Comments: Spoke full sentences without difficulty; no cough observed in exam room Abdominal:     General: Abdomen is flat. There is no distension.     Tenderness: There is no guarding.  Musculoskeletal:        General: Normal range of motion.     Cervical back: Normal range of motion and neck supple. No edema, erythema, signs of trauma, rigidity, torticollis or crepitus. No  pain with movement. Normal range of motion.     Right lower leg: No edema.     Left lower leg: No edema.  Lymphadenopathy:     Head:     Right side of head: No submandibular or preauricular adenopathy.     Left side of head: No submandibular or preauricular adenopathy.     Cervical: No cervical adenopathy.     Right cervical: No superficial cervical adenopathy.    Left cervical: No superficial cervical adenopathy.  Skin:    General: Skin is warm and dry.     Capillary Refill: Capillary refill takes less than 2 seconds.     Coloration: Skin is not ashen, cyanotic, jaundiced, mottled, pale or sallow.     Findings: No abrasion, abscess, acne, bruising, burn, ecchymosis, erythema, signs of injury, laceration, lesion,  petechiae, rash or wound.     Comments: Anterior face/neck/arms visually inspected  Neurological:     General: No focal deficit present.     Mental Status: She is alert and oriented to person, place, and time. Mental status is at baseline.     GCS: GCS eye subscore is 4. GCS verbal subscore is 5. GCS motor subscore is 6.     Cranial Nerves: Cranial nerves 2-12 are intact. No cranial nerve deficit, dysarthria or facial asymmetry.     Sensory: Sensation is intact.     Motor: Motor function is intact. No weakness, tremor, atrophy, abnormal muscle tone or seizure activity.     Coordination: Coordination is intact. Coordination normal.     Gait: Gait is intact. Gait normal.     Comments: In/out of chair without difficulty; gait sure and steady in clinic; bilateral hand grasp equal 5/5  Psychiatric:        Attention and Perception: Attention and perception normal.        Mood and Affect: Mood and affect normal.        Speech: Speech normal.        Behavior: Behavior normal. Behavior is cooperative.        Thought Content: Thought content normal.        Cognition and Memory: Cognition and memory normal.        Judgment: Judgment normal.      No results found for any visits on 05/22/24.    The 10-year ASCVD risk score (Arnett DK, et al., 2019) is: 7.5%    Assessment & Plan:   Problem List Items Addressed This Visit   None Visit Diagnoses       Gastroesophageal reflux disease, unspecified whether esophagitis present    -  Primary   Relevant Medications   famotidine (PEPCID) 20 MG tablet     Discussed delayed gastric emptying with rybelsus  and metformin /diabetes.  Decrease caffeine from 120z to 6oz am.  Patient considering stopping caffeine intake to see if it will help symptoms.  Discussed cold turkey stopping caffeine may results in withdrawal headache.  Avoid tight fitting clothes.  Tums not helping  Carafate  helps a little  Omeprazole  not helping stop use and trial famotidine 20mg   po BID x 2 weeks #30 RF0 electronic rx sent to her pharmacy of choice. Discussed separate iron and famotidine administration by at least one hour.   Decrease portion sizes meals/snacks.  Talk to [facility] if metformin  can be decreased.  Discussed rybelsus  and metformin  along with diabetes can decrease gastric emptying and lengthen amount of time food held in stomach.   Discussed cannot cut rybelsus  pill or  crush/chew.  Has been on rybelsus  since Mar 2024  Hgba1c last 6.5  Avoid trigger foods.  Avoid carbonated beverages.  Avoid lying flat for 4 hours after eating.  Patient plans follow up with PCM to discuss metformin  and rybelsus .  Exitcare handouts on GERD/hiatal hernia.  Patient agreed with plan of care and had no further questions at this time.  Return if symptoms worsen or fail to improve.    Ellouise DELENA Hope, NP

## 2024-05-22 NOTE — Patient Instructions (Signed)
 GERD in Adults: What to Know  Gastroesophageal reflux (GER) is when acid from your stomach flows up into your esophagus. Your esophagus is the part of your body that moves food from your mouth to your stomach. Normally, food goes down and stays in your stomach to be digested. But with GER, food and stomach acid may go back up. You may have a disease called gastroesophageal reflux disease (GERD) if the reflux: Happens often. Causes very bad symptoms. Makes your esophagus sore and swollen. Over time, GERD can make small holes called ulcers in the lining of your esophagus. What are the causes? GERD is caused by a problem with the muscle between your esophagus and stomach. This muscle is called the lower esophageal sphincter (LES). When it's weak or not normal, it doesn't close like it should. This means food and stomach acid can go back up into your esophagus. The muscle can be weak if: You smoke or use products with tobacco in them. You're pregnant. You have a type of hernia called a hiatal hernia. You eat certain foods and drinks. These include: Alcohol. Coffee. Chocolate. Onions. Peppermint. What increases the risk? Being overweight. Having a disease that affects your connective tissue. Taking NSAIDs, such as ibuprofen . What are the signs or symptoms? Heartburn. Trouble swallowing. Pain when you swallow. The feeling of having a lump in your throat. A bitter taste in your mouth. Bad breath. Having an upset or bloated stomach. Burping. Chest pain. Other conditions can also cause chest pain. Make sure you see your health care provider if you have chest pain. Wheezing. This is when you make high-pitched whistling sounds when you breathe, most often when you breathe out. A long-term cough or a cough at night. How is this diagnosed? GERD may be diagnosed based on your medical history and a physical exam. You may also have tests. These may include: An endoscopy. This test looks at your  stomach and esophagus with a small camera. A barium swallow test. This shows the shape and size of your esophagus and how well it's working. Tests of your esophagus to check for: Acid levels. Pressure. How is this treated? Treatment may depend on how bad your symptoms are. It may include: Changes to your diet and daily life. Medicines. Surgery. Follow these instructions at home: Eating and drinking Follow an eating plan as told by your provider. You may need to avoid certain foods and drinks. These may include: Coffee and tea, with or without caffeine. Alcohol. Energy drinks and sports drinks. Fizzy drinks or sodas. Chocolate and cocoa. Peppermint and mint flavorings. Garlic and onions. Horseradish. Spicy and acidic foods. These include: Peppers. Chili powder and curry powder. Vinegar. Hot sauces and BBQ sauce. Citrus fruits and juices. These include: Oranges. Lemons. Limes. Tomato-based foods. These include: Red sauce and pizza with red sauce. Chili. Salsa. Fried and fatty foods. These include: Donuts. French fries. Potato chips. High-fat dressings. High-fat meats. These include: Hot dogs and sausage. Rib eye steak. Ham and bacon. High-fat dairy items. These include: Whole milk. Butter. Cream cheese. Eat small meals often. Avoid eating big meals. Avoid drinking lots of liquid with your meals. Try not to eat meals during the 2-3 hours before bedtime. Try not to lie down right after you eat. Do not exercise right after you eat. Lifestyle  If you're overweight, lose an amount of weight that's healthy for you. Ask your provider about a safe weight loss goal. Do not smoke, vape, or use nicotine or tobacco. Wear  loose clothes. Do not wear things that are tight around your waist. When you sleep, try: Raising the head of your bed about 6 inches (15 cm). You can use a wedge to do this. Lying down on your left side. Try to lower your stress. If you need help doing  this, ask your provider. General instructions Take your medicines only as told. Do not take aspirin  or ibuprofen  unless you're told to. Watch for any changes in your symptoms. Do not bend over if it makes your symptoms worse. Contact a health care provider if: You have new symptoms. You have trouble: Drinking. Swallowing. Eating. It hurts to swallow. You have wheezing. You have a cough that won't go away. Your voice is hoarse. Your symptoms don't get better with treatment. Get help right away if: You have pain all of a sudden in your: Arm. Neck. Jaw. Teeth. Back. You feel sweaty, dizzy, or light-headed all of a sudden. You faint. You have chest pain or shortness of breath. You vomit and the vomit is: Green, yellow, or black. Looks like blood or coffee grounds. Your poop is red, bloody, or black. These symptoms may be an emergency. Call 911 right away. Do not wait to see if the symptoms will go away. Do not drive yourself to the hospital. This information is not intended to replace advice given to you by your health care provider. Make sure you discuss any questions you have with your health care provider. Document Revised: 05/08/2023 Document Reviewed: 11/22/2022 Elsevier Patient Education  2024 [employer]Hiatal Hernia  A hiatal hernia occurs when part of the stomach slides above the muscle that separates the abdomen from the chest (diaphragm). A person can be born with a hiatal hernia (congenital), or it may develop over time. In almost all cases of hiatal hernia, only the top part of the stomach pushes through the diaphragm. Many people have a hiatal hernia with no symptoms. The larger the hernia, the more likely it is that you will have symptoms. In some cases, a hiatal hernia allows stomach acid to flow back into the tube that carries food from your mouth to your stomach (esophagus). This may cause heartburn symptoms. The development of heartburn symptoms may mean that  you have a condition called gastroesophageal reflux disease (GERD). What are the causes? This condition is caused by a weakness in the opening (hiatus) where the esophagus passes through the diaphragm to attach to the upper part of the stomach. A person may be born with a weakness in the hiatus, or a weakness can develop over time. What increases the risk? This condition is more likely to develop in: Older people. Age is a major risk factor for a hiatal hernia, especially if you are over the age of 52. Pregnant women. People who are overweight. People who have frequent constipation. What are the signs or symptoms? Symptoms of this condition usually develop in the form of GERD symptoms. Symptoms include: Heartburn. Upset stomach (indigestion). Trouble swallowing. Coughing or wheezing. Wheezing is making high-pitched whistling sounds when you breathe. Sore throat. Chest pain. Nausea and vomiting. How is this diagnosed? This condition may be diagnosed during testing for GERD. Tests that may be done include: X-rays of your stomach or chest. An upper gastrointestinal (GI) series. This is an X-ray exam of your GI tract that is taken after you swallow a chalky liquid that shows up clearly on the X-ray. Endoscopy. This is a procedure to look into your stomach using a thin, flexible  tube that has a tiny camera and light on the end of it. How is this treated? This condition may be treated by: Dietary and lifestyle changes to help reduce GERD symptoms. Medicines. These may include: Over-the-counter antacids. Medicines that make your stomach empty more quickly. Medicines that block the production of stomach acid (H2 blockers). Stronger medicines to reduce stomach acid (proton pump inhibitors). Surgery to repair the hernia, if other treatments are not helping. If you have no symptoms, you may not need treatment. Follow these instructions at home: Lifestyle and activity Do not use any products  that contain nicotine or tobacco. These products include cigarettes, chewing tobacco, and vaping devices, such as e-cigarettes. If you need help quitting, ask your health care provider. Try to achieve and maintain a healthy body weight. Avoid putting pressure on your abdomen. Anything that puts pressure on your abdomen increases the amount of acid that may be pushed up into your esophagus. Avoid bending over, especially after eating. Raise the head of your bed by putting blocks under the legs. This keeps your head and esophagus higher than your stomach. Do not wear tight clothing around your chest or stomach. Try not to strain when having a bowel movement, when urinating, or when lifting heavy objects. Eating and drinking Avoid foods that can worsen GERD symptoms. These may include: Fatty foods, like fried foods. Citrus fruits, like oranges or lemon. Other foods and drinks that contain acid, like orange juice or tomatoes. Spicy food. Chocolate. Eat frequent small meals instead of three large meals a day. This helps prevent your stomach from getting too full. Eat slowly. Do not lie down right after eating. Do not eat 1-2 hours before bed. Do not drink beverages with caffeine. These include cola, coffee, cocoa, and tea. Do not drink alcohol. General instructions Take over-the-counter and prescription medicines only as told by your health care provider. Keep all follow-up visits. Your health care provider will want to check that any new prescribed medicines are helping your symptoms. Contact a health care provider if: Your symptoms are not controlled with medicines or lifestyle changes. You are having trouble swallowing. You have coughing or wheezing that will not go away. Your pain is getting worse. Your pain spreads to your arms, neck, jaw, teeth, or back. You feel nauseous or you vomit. Get help right away if: You have shortness of breath. You vomit blood. You have bright red blood  in your stools. You have black, tarry stools. These symptoms may be an emergency. Get help right away. Call 911. Do not wait to see if the symptoms will go away. Do not drive yourself to the hospital. Summary A hiatal hernia occurs when part of the stomach slides above the muscle that separates the abdomen from the chest. A person may be born with a weakness in the hiatus, or a weakness can develop over time. Symptoms of a hiatal hernia may include heartburn, trouble swallowing, or sore throat. Management of a hiatal hernia includes eating frequent small meals instead of three large meals a day. Get help right away if you vomit blood, have bright red blood in your stools, or have black, tarry stools. This information is not intended to replace advice given to you by your health care provider. Make sure you discuss any questions you have with your health care provider. Document Revised: 08/23/2021 Document Reviewed: 08/23/2021 Elsevier Patient Education  2024 [employer].

## 2024-06-16 ENCOUNTER — Encounter (INDEPENDENT_AMBULATORY_CARE_PROVIDER_SITE_OTHER): Payer: PRIVATE HEALTH INSURANCE | Admitting: Ophthalmology

## 2024-06-16 DIAGNOSIS — H2513 Age-related nuclear cataract, bilateral: Secondary | ICD-10-CM

## 2024-06-16 DIAGNOSIS — H35033 Hypertensive retinopathy, bilateral: Secondary | ICD-10-CM | POA: Diagnosis not present

## 2024-06-16 DIAGNOSIS — H34811 Central retinal vein occlusion, right eye, with macular edema: Secondary | ICD-10-CM | POA: Diagnosis not present

## 2024-06-16 DIAGNOSIS — I10 Essential (primary) hypertension: Secondary | ICD-10-CM

## 2024-06-16 DIAGNOSIS — H43813 Vitreous degeneration, bilateral: Secondary | ICD-10-CM

## 2024-06-20 ENCOUNTER — Telehealth: Payer: Self-pay | Admitting: Registered Nurse

## 2024-06-20 ENCOUNTER — Encounter: Payer: Self-pay | Admitting: Registered Nurse

## 2024-06-20 DIAGNOSIS — I10 Essential (primary) hypertension: Secondary | ICD-10-CM

## 2024-06-20 NOTE — Telephone Encounter (Signed)
 Patient was told by her eye doctor Norleen Ku that her blood pressure needs to improve as she is getting retinal swelling right eye.  Address [address] [place], [place]  Phone [phone]  She also needs treatment with Avastin injection to right eye for swelling of retina.  Treatment couldn't be performed at last appt because insurance approval was needed. She stated she also has to have driver for next appt due to dilation required for injection and her spouse to drive her.  She is concerned her vision may be affected with delay in treatment.  Discussed with patient insurance is responsive when all forms completed and office visit notes requested sent for authorizations.  Discussed she can see RN Karene for BP checks on review in [place] with patient only sick visits with BP noted in Epic and blood pressure was above goal of less than 130/80.  Patient is taking her losartan  daily and has been trying to lose weight.  Be Well 2026 Dec 25, 2023 BP 152/82 weight 198lb  Decreased 4 lbs since June on rybelsus  now 194lbs.  Weight 2024 physical with PCM 208lbs 02/02/23 BP 138/84; 2023 PCM physical 136/86 218lb; 2022 134/80 weight 213lbs  Latest Reference Range & Units 07/07/20 11:01 09/09/20 08:34 01/12/21 10:05 06/10/21 08:23 12/27/21 08:40 08/04/22 08:16 02/02/23 08:55 08/03/23 08:14 12/25/23 08:10  Hemoglobin A1C 4.8 - 5.6 % 7.9 (H) 6.9 (H) 7.9 (H) 7.2 ! 7.7 (H) 5.6 7.7 (H) Pend 6.9 6.5 (H)  (H): Data is abnormally high !: Data is abnormal  Discussed increasing potassium rich foods in diet to help lower blood pressure also.   e.g. banana, avocado, spinach, potatoes, sweet potatoes, orange, cantaloupe, tomatoes, yogurt, lentils, dried apricots, salmon, beans, rasisins, watermelon, broccoli, navy beans, swiss chard, beets, brussel sprouts, peas, acord squash, prunes, pumpkin, guava, kiwi fruit, pomegranate, and cherries.  Patient verbalized understanding information and had no further questions at  that time.

## 2024-06-25 ENCOUNTER — Encounter (INDEPENDENT_AMBULATORY_CARE_PROVIDER_SITE_OTHER): Admitting: Ophthalmology

## 2024-06-25 DIAGNOSIS — H34811 Central retinal vein occlusion, right eye, with macular edema: Secondary | ICD-10-CM

## 2024-07-01 ENCOUNTER — Ambulatory Visit: Admitting: Registered Nurse

## 2024-07-02 NOTE — Telephone Encounter (Signed)
 Patient had scheduled and canceled appt with NP 07/01/24.  Seen in workcenter stated was having asthma flare up 06/30/24 but improved 07/01/24 hoarse voice today A&Ox3 no audible cough/wheezing/chest tightness.  Skin warm dry and pink respirations even and unlabored RA  spoke full sentences without difficulty.  Patient notified NP on vacation starting this afternoon through Sunday.  Patient verbalized understanding information/instructions, agreed with plan of care and had no further questions at this time.

## 2024-07-07 ENCOUNTER — Telehealth: Payer: Self-pay

## 2024-07-07 NOTE — Telephone Encounter (Signed)
 Copied from CRM #[id number]. Topic: Medical Record Request - Other >> Jul 07, 2024  2:39 PM Berwyn MATSU wrote: Reason for CRM: Patient called in requesting to know if we received paperwork from the [facility] paperwork Patient is requesting a call back with an update was advised faxed on 07/01/24.  May you please advise.

## 2024-07-08 NOTE — Telephone Encounter (Signed)
 Pt is aware, paperwork has been received and in process. Will notify pt when completed.

## 2024-07-16 NOTE — Telephone Encounter (Signed)
 Paperwork is still in process. Will notify pt once completed.

## 2024-07-22 ENCOUNTER — Other Ambulatory Visit: Payer: Self-pay | Admitting: Internal Medicine

## 2024-07-22 NOTE — Telephone Encounter (Signed)
 Patient reported feeling well denied asthma concerns at work today respirations even and unlabored RA skin warm dry and pink gait sure and steady A&Ox3

## 2024-07-23 ENCOUNTER — Encounter (INDEPENDENT_AMBULATORY_CARE_PROVIDER_SITE_OTHER): Admitting: Ophthalmology

## 2024-07-23 DIAGNOSIS — H2513 Age-related nuclear cataract, bilateral: Secondary | ICD-10-CM | POA: Diagnosis not present

## 2024-07-23 DIAGNOSIS — H43813 Vitreous degeneration, bilateral: Secondary | ICD-10-CM | POA: Diagnosis not present

## 2024-07-23 DIAGNOSIS — H34811 Central retinal vein occlusion, right eye, with macular edema: Secondary | ICD-10-CM

## 2024-07-23 DIAGNOSIS — I10 Essential (primary) hypertension: Secondary | ICD-10-CM

## 2024-07-23 DIAGNOSIS — H35033 Hypertensive retinopathy, bilateral: Secondary | ICD-10-CM

## 2024-07-31 ENCOUNTER — Ambulatory Visit: Admitting: Registered Nurse

## 2024-07-31 ENCOUNTER — Encounter: Payer: Self-pay | Admitting: Registered Nurse

## 2024-07-31 VITALS — BP 132/92 | HR 100 | Temp 98.7°F | Resp 18

## 2024-07-31 DIAGNOSIS — J069 Acute upper respiratory infection, unspecified: Secondary | ICD-10-CM

## 2024-07-31 DIAGNOSIS — A084 Viral intestinal infection, unspecified: Secondary | ICD-10-CM

## 2024-07-31 MED ORDER — SALINE SPRAY 0.65 % NA SOLN: 2.0000 | NASAL | 0 refills | Status: AC

## 2024-07-31 NOTE — Progress Notes (Signed)
 "  Established Patient Office Visit  Subjective   Patient ID: Kelly Reed, female    DOB: [birth year]  Age: 59 y.o. MRN: [id number]  Chief Complaint  Patient presents with   Diarrhea    Started Monday stayed home Monday Tuesday    58y/o caucasian married female here for evaluation congestion, sinus pressure, fatigue, diarrhea.  Started Monday has been using nose sprays and allergy pills helping a little thought diarrhea was improving returned to work today had bagel with cream cheese for breakfast and last diarrhea 1 hour ago.  Denied wheezing/hemoptysis/headache.  Has noted sinus pressure, teeth are not hurting.  Mucous clear.  Denied fever.  Tolerating po intake without vomiting.  Called out sick Monday and Tuesday.      Review of Systems  Constitutional:  Positive for malaise/fatigue. Negative for diaphoresis and fever.  HENT:  Positive for congestion, sinus pain and sore throat. Negative for ear discharge, ear pain, nosebleeds and tinnitus.   Eyes:  Negative for pain, discharge and redness.  Respiratory:  Positive for sputum production. Negative for cough, hemoptysis, shortness of breath, wheezing and stridor.   Cardiovascular:  Negative for chest pain.  Gastrointestinal:  Positive for abdominal pain and diarrhea. Negative for blood in stool, constipation and nausea.  Genitourinary:  Negative for dysuria.  Musculoskeletal:  Negative for back pain, falls, joint pain, myalgias and neck pain.  Skin:  Negative for itching and rash.  Neurological:  Negative for dizziness, tingling, tremors, speech change, focal weakness, seizures, loss of consciousness, weakness and headaches.  Endo/Heme/Allergies:  Positive for environmental allergies.      Objective:     BP (!) 132/92 (BP Location: Right Arm, Patient Position: Sitting, Cuff Size: Normal)   Pulse 100   Temp 98.7 F (37.1 C) (Tympanic)   Resp 18   SpO2 98%    Physical Exam Vitals and nursing note reviewed.   Constitutional:      General: She is awake. She is not in acute distress.    Appearance: Normal appearance. She is well-developed and well-groomed. She is not ill-appearing, toxic-appearing or diaphoretic.  HENT:     Head: Normocephalic and atraumatic.     Jaw: There is normal jaw occlusion. No trismus.     Salivary Glands: Right salivary gland is not diffusely enlarged or tender. Left salivary gland is not diffusely enlarged or tender.     Right Ear: Hearing, ear canal and external ear normal. No decreased hearing noted. No laceration, drainage, swelling or tenderness. A middle ear effusion is present. There is no impacted cerumen. No foreign body. No mastoid tenderness. No PE tube. No hemotympanum. Tympanic membrane is not injected, scarred, perforated or erythematous.     Left Ear: Hearing, ear canal and external ear normal. No decreased hearing noted. No laceration, drainage, swelling or tenderness. A middle ear effusion is present. There is no impacted cerumen. No foreign body. No mastoid tenderness. No PE tube. No hemotympanum. Tympanic membrane is not injected, scarred, perforated or erythematous.     Ears:     Comments: Bilateral TMs intact air fluid level clear; no debris noted bilateral auditory canals    Nose: Mucosal edema, congestion and rhinorrhea present. No nasal deformity, septal deviation or laceration. Rhinorrhea is clear.     Right Nostril: No epistaxis.     Left Nostril: No epistaxis.     Right Turbinates: Enlarged and swollen. Not pale.     Left Turbinates: Enlarged and swollen. Not pale.  Right Sinus: Maxillary sinus tenderness and frontal sinus tenderness present.     Left Sinus: Maxillary sinus tenderness and frontal sinus tenderness present.     Comments: Bilateral frontal greater than maxillary sinus pain with palpation; nasal turbinates edema erythema clear discharge; bilateral allergic shiners; cobblestoning posterior pharynx; bilateral allergic shiners     Mouth/Throat:     Lips: Pink. No lesions.     Mouth: Mucous membranes are moist. Mucous membranes are not pale, not dry and not cyanotic. No lacerations, oral lesions or angioedema.     Dentition: No dental abscesses or gum lesions.     Tongue: No lesions. Tongue does not deviate from midline.     Palate: No mass and lesions.     Pharynx: Uvula midline. Pharyngeal swelling, posterior oropharyngeal erythema and postnasal drip present. No oropharyngeal exudate or uvula swelling.     Tonsils: No tonsillar exudate or tonsillar abscesses.  Eyes:     General: Lids are normal. Vision grossly intact. Gaze aligned appropriately. Allergic shiner present. No scleral icterus.       Right eye: No foreign body, discharge or hordeolum.        Left eye: No foreign body, discharge or hordeolum.     Extraocular Movements:     Right eye: Normal extraocular motion and no nystagmus.     Left eye: Normal extraocular motion and no nystagmus.     Conjunctiva/sclera: Conjunctivae normal.     Right eye: Right conjunctiva is not injected. No chemosis, exudate or hemorrhage.    Left eye: Left conjunctiva is not injected. No chemosis, exudate or hemorrhage.    Pupils: Pupils are equal, round, and reactive to light. Pupils are equal.     Right eye: Pupil is round and reactive.     Left eye: Pupil is round and reactive.  Neck:     Thyroid : No thyroid  mass, thyromegaly or thyroid  tenderness.     Trachea: Trachea and phonation normal. No tracheal tenderness or tracheal deviation.  Cardiovascular:     Rate and Rhythm: Regular rhythm. Tachycardia present.     Pulses: Normal pulses.          Radial pulses are 2+ on the right side and 2+ on the left side.     Heart sounds: Normal heart sounds, S1 normal and S2 normal. No murmur heard.    No friction rub. No gallop.     Comments: Patient HR intermittent monitoring 100-114 talking with provider in exam room Pulmonary:     Effort: Pulmonary effort is normal. No accessory  muscle usage or respiratory distress.     Breath sounds: Normal breath sounds and air entry. No stridor. No decreased breath sounds, wheezing, rhonchi or rales.     Comments: Spoke full sentences without difficulty; no cough observed in exam room; BBS CTA Chest:     Chest wall: No tenderness.  Abdominal:     General: Bowel sounds are decreased. There is no distension.     Palpations: Abdomen is soft. There is no fluid wave, hepatomegaly, splenomegaly, mass or pulsatile mass.     Tenderness: There is abdominal tenderness in the epigastric area. There is no guarding or rebound.     Hernia: No hernia is present.      Comments: Dull to percussion x 4 quads; hypoactive bowel sounds x 4 quads; standing to sitting and supine reversed without difficulty or assistance; mild tenderness epigastric palpation  Musculoskeletal:        General: No tenderness. Normal  range of motion.     Right shoulder: Normal range of motion. Normal strength.     Left shoulder: Normal range of motion. Normal strength.     Right hand: Normal strength. Normal capillary refill.     Left hand: Normal strength. Normal capillary refill.     Cervical back: Normal range of motion and neck supple. No swelling, edema, deformity, erythema, signs of trauma, lacerations, rigidity, spasms, torticollis, tenderness or crepitus. No pain with movement or muscular tenderness. Normal range of motion.     Thoracic back: No swelling, edema, deformity, signs of trauma, lacerations, spasms or tenderness. Normal range of motion.     Right lower leg: No edema.     Left lower leg: No edema.     Right ankle: No swelling.     Left ankle: No swelling.  Lymphadenopathy:     Head:     Right side of head: No submental, submandibular, tonsillar, preauricular, posterior auricular or occipital adenopathy.     Left side of head: No submental, submandibular, tonsillar, preauricular, posterior auricular or occipital adenopathy.     Cervical: No cervical  adenopathy.     Right cervical: No superficial, deep or posterior cervical adenopathy.    Left cervical: No superficial, deep or posterior cervical adenopathy.  Skin:    General: Skin is warm and dry.     Capillary Refill: Capillary refill takes less than 2 seconds.     Coloration: Skin is not ashen, cyanotic, jaundiced, mottled, pale or sallow.     Findings: No abrasion, abscess, acne, bruising, burn, ecchymosis, erythema, signs of injury, laceration, lesion, petechiae, rash or wound.     Nails: There is no clubbing.     Comments: Anterior face/neck/hands/ankles visually inspected  Neurological:     General: No focal deficit present.     Mental Status: She is alert and oriented to person, place, and time. Mental status is at baseline. She is not disoriented.     GCS: GCS eye subscore is 4. GCS verbal subscore is 5. GCS motor subscore is 6.     Cranial Nerves: No cranial nerve deficit, dysarthria or facial asymmetry.     Sensory: Sensation is intact. No sensory deficit.     Motor: Motor function is intact. No weakness, tremor, atrophy, abnormal muscle tone or seizure activity.     Coordination: Coordination is intact. Coordination normal.     Gait: Gait is intact. Gait normal.     Comments: In/out of chair and on/off exam table without difficulty; gait sure and steady in clinic; bilateral hand grasp equal 5/5  Psychiatric:        Attention and Perception: Attention and perception normal.        Mood and Affect: Mood and affect normal.        Speech: Speech normal.        Behavior: Behavior normal. Behavior is cooperative.        Thought Content: Thought content normal.        Cognition and Memory: Cognition and memory normal.        Judgment: Judgment normal.      No results found for any visits on 07/31/24.    The 10-year ASCVD risk score (Arnett DK, et al., 2019) is: 7.4%    Assessment & Plan:   Problem List Items Addressed This Visit   None Visit Diagnoses       Viral  gastroenteritis    -  Primary     Viral  URI         Given 1 bottle nasal saline from clinic stock.  Continue nasal steroid and antihistamine use 1 spray each nostril BID, antihistamine orale daily and singulair  10mg  po at bedtime.  Discussed I would not use sudafed OTC at this time due to will raise heart rate further and already tachycardia.  Increase water/gatorade low sugar/soup broth intake today as need to replace lost fluids and electrolytes.  Supervisor team and HR notified excused absence starting 07/29/23-08/02/23 for communicable disease diarrhea discussed to go home rest/hydrate and re-evaluation via telephone Sunday with NP.  I have recommended clear fluids and advanced to soft as tolerated.  Avoid dairy, spicy and fried foods until diarrhea resolves. Avoid dehydration drink noncaffeinated beverages (water, ginger ale, soup broth, popsicles, no sugar added gatorade/powerade) to urinate every 2-4 hours pale yellow urine.  It is easy to become dehydrated when having diarrhea along with electrolyte imbalances.    Patient given exitcare handouts on diarrhea, viral uri and foods to relieve diarrhea. Medications as directed.  Return to the clinic if symptoms persist or worsen; I have alerted the patient to call if high fever, dehydration, marked weakness, fainting, increased abdominal pain, blood in stool or vomit. Patient given work excuse note for 48 hours. Patient verbalized agreement and understanding of treatment plan and had no further questions at this time. P2: Hand washing and fitness   No signs of bacterial infection on examination.  Avoid dehydration discussed may need up to an extra liter of fluid per day with mucous/mouth breathing.  Monitor color of urine.  Discussed bland diet avoid spicy/fried/large portions meat/dairy if having nausea/sour stomach.  Advised symptomatic treatment with nasal saline, Flonase , and over-the-counter cold remedies as needed.  Discussed administration of saline  first then flonase .  Advised salt water gargles for sore throat relief. Advised to maintain hydration and rest.  Honey 1 tablespoon po q4h prn cough. Tylenol  1000mg  po q6h prn pain.  Discussed need to dry up post nasal drip and will help with cough/sore throat also.  Discussed many viruses circulating in community at this time adenovirus, rsv, covid, flu, norovirus and metapneumovirus.  Notify me if she develops fever greater than 100.60F or positive home covid test later this week as instructed to retest in 48 hours if no improvement in symptoms or new/worsening symptoms.  Discussed hydrate with water to keep urine pale yellow clear and voiding every 2-4 hours while awake.  Patient may use normal saline nasal spray 2 sprays each nostril q2h wa as needed. Flonase  nasal 50mcg 1 spray each nostril BID OTC.  May use OTC robitussin, delsym, nyquil per manufacturer instructions for cough if honey not helping enough.  Discussed post nasal drip triggering cough and sore throat need to get it dried up.  Shower BID prn congestion  Notify me via my chart if positive home covid test, new fever, white spots in back of throat, mucous thick/chunky/brown/foul tasting opaque as may need antibiotic if that occurs.  Normal viral mucous is clear to yellow to green than done.  Call or return to clinic as needed if these symptoms worsen or fail to improve as anticipated.   Exitcare handouts viral URI with cough, and sinus rinse sent to patient my chart.  Patient verbalized understanding of instructions, agreed with plan of care and had no further questions at this time.  P2:  Avoidance and hand washing.     Return if symptoms worsen or fail to improve.  Ellouise DELENA Hope, NP  "

## 2024-07-31 NOTE — Patient Instructions (Addendum)
 Food Choices to Help Relieve Diarrhea, Adult Diarrhea can make you feel weak and cause you to become dehydrated. Dehydration is a condition in which there is not enough water or other fluids in the body. It is important to choose the right foods and drinks to: Relieve diarrhea. Replace lost fluids and nutrients. Prevent dehydration. What are tips for following this plan? Relieving diarrhea Avoid foods that make your diarrhea worse. These may include: Foods and drinks that are sweetened with high-fructose corn syrup, honey, or sweeteners such as xylitol, sorbitol, and mannitol. Check food labels for these ingredients. Fried, greasy, or spicy foods. Raw fruits and vegetables. Eat foods that are rich in probiotics. These include foods such as yogurt and fermented milk products. Probiotics can help increase healthy bacteria in your stomach and intestines (gastrointestinal or GI tract). This may help digestion and stop diarrhea. If you have lactose intolerance, avoid dairy products. These may make your diarrhea worse. Take medicine to help stop diarrhea only as told by your health care provider. Replacing nutrients  Eat bland, easy-to-digest foods in small amounts as you are able, until your diarrhea starts to get better. These foods include bananas, applesauce, rice, toast, and crackers. Over time, add nutrient-rich foods as your body tolerates them or as told by your health care provider. These include: Well-cooked protein foods, such as eggs, lean meats like fish or chicken without skin, and tofu. Peeled, seeded, and soft-cooked fruits and vegetables. Low-fat dairy products. Whole grains. Take vitamin and mineral supplements as told by your health care provider. Preventing dehydration  Start by sipping water or a solution to prevent dehydration (oral rehydration solution, or ORS). This is a drink that helps replace fluids and minerals your body has lost. You can buy an ORS at pharmacies and  retail stores. Try to drink at least 8-10 cups (2,000-2,500 mL) of fluid each day to help replace lost fluids. If your urine is pale yellow, you are getting enough fluids. You may drink other liquids in addition to water, such as fruit juice that you have added water to (diluted fruit juice) or low-calorie sports drinks, as tolerated or as told by your health care provider. Avoid drinks with caffeine, such as coffee, tea, or soft drinks. Avoid alcohol. This information is not intended to replace advice given to you by your health care provider. Make sure you discuss any questions you have with your health care provider. Document Revised: 12/13/2021 Document Reviewed: 12/13/2021 Elsevier Patient Education  2024 [employer]Viral Gastroenteritis, Adult  Viral gastroenteritis is also known as the stomach flu. This condition may affect your stomach, small intestine, and large intestine. It can cause sudden watery diarrhea, fever, and vomiting. This condition is caused by many different viruses. These viruses can be passed from person to person very easily (are contagious). Diarrhea and vomiting can make you feel weak and cause you to become dehydrated. You may not be able to keep fluids down. Dehydration can make you tired and thirsty, cause you to have a dry mouth, and decrease how often you urinate. It is important to replace the fluids that you lose from diarrhea and vomiting. What are the causes? Gastroenteritis is caused by many viruses, including rotavirus and norovirus. Norovirus is the most common cause in adults. You can get sick after being exposed to the viruses from other people. You can also get sick by: Eating food, drinking water, or touching a surface contaminated with one of these viruses. Sharing utensils or other personal items  with an infected person. What increases the risk? You are more likely to develop this condition if you: Have a weak body defense system (immune  system). Live with one or more children who are younger than 2 years. Live in a nursing home. Travel on cruise ships. What are the signs or symptoms? Symptoms of this condition start suddenly 1-3 days after exposure to a virus. Symptoms may last for a few days or for as long as a week. Common symptoms include watery diarrhea and vomiting. Other symptoms include: Fever. Headache. Fatigue. Pain in the abdomen. Chills. Weakness. Nausea. Muscle aches. Loss of appetite. How is this diagnosed? This condition is diagnosed with a medical history and physical exam. You may also have a stool test to check for viruses or other infections. How is this treated? This condition typically goes away on its own. The focus of treatment is to prevent dehydration and restore lost fluids (rehydration). This condition may be treated with: An oral rehydration solution (ORS) to replace important salts and minerals (electrolytes) in your body. Take this if told by your health care provider. This is a drink that is sold at pharmacies and retail stores. Medicines to help with your symptoms. Probiotic supplements to reduce symptoms of diarrhea. Fluids given through an IV, if dehydration is severe. Older adults and people with other diseases or a weak immune system are at higher risk for dehydration. Follow these instructions at home: Eating and drinking  Take an ORS as told by your health care provider. Drink clear fluids in small amounts as you are able. Clear fluids include: Water. Ice chips. Diluted fruit juice. Low-calorie sports drinks. Drink enough fluid to keep your urine pale yellow. Eat small amounts of healthy foods every 3-4 hours as you are able. This may include whole grains, fruits, vegetables, lean meats, and yogurt. Avoid fluids that contain a lot of sugar or caffeine, such as energy drinks, sports drinks, and soda. Avoid spicy or fatty foods. Avoid alcohol. General instructions  Wash your  hands often, especially after having diarrhea or vomiting. If soap and water are not available, use hand sanitizer. Make sure that all people in your household wash their hands well and often. Take over-the-counter and prescription medicines only as told by your health care provider. Rest at home while you recover. Watch your condition for any changes. Take a warm bath to relieve any burning or pain from frequent diarrhea episodes. Keep all follow-up visits. This is important. Contact a health care provider if you: Cannot keep fluids down. Have symptoms that get worse. Have new symptoms. Feel light-headed or dizzy. Have muscle cramps. Get help right away if you: Have chest pain. Have trouble breathing or you are breathing very quickly. Have a fast heartbeat. Feel extremely weak or you faint. Have a severe headache, a stiff neck, or both. Have a rash. Have severe pain, cramping, or bloating in your abdomen. Have skin that feels cold and clammy. Feel confused. Have pain when you urinate. Have signs of dehydration, such as: Dark urine, very little urine, or no urine. Cracked lips. Dry mouth. Sunken eyes. Sleepiness. Weakness. Have signs of bleeding, such as: Seeing blood in your vomit. Having vomit that looks like coffee grounds. Having bloody or black stools or stools that look like tar. These symptoms may be an emergency. Get help right away. Call 911. Do not wait to see if the symptoms will go away. Do not drive yourself to the hospital. Summary Viral gastroenteritis is also  known as the stomach flu. It can cause sudden watery diarrhea, fever, and vomiting. This condition can be passed from person to person very easily (is contagious). Take an oral rehydration solution (ORS) if told by your health care provider. This is a drink that is sold at pharmacies and retail stores. Wash your hands often, especially after having diarrhea or vomiting. If soap and water are not  available, use hand sanitizer. This information is not intended to replace advice given to you by your health care provider. Make sure you discuss any questions you have with your health care provider. Document Revised: 04/25/2021 Document Reviewed: 04/25/2021 Elsevier Patient Education  2024 [employer]Viral Respiratory Infection A respiratory infection is an illness that affects part of the respiratory system, such as the lungs, nose, or throat. A respiratory infection that is caused by a virus is called a viral respiratory infection. Common types of viral respiratory infections include: A cold. The flu (influenza). A respiratory syncytial virus (RSV) infection. What are the causes? This condition is caused by a virus. The virus may spread through contact with droplets or direct contact with infected people or their mucus or secretions. The virus may spread from person to person (is contagious). What are the signs or symptoms? Symptoms of this condition include: A stuffy or runny nose. A sore throat or cough. Shortness of breath or difficulty breathing. Yellow or green mucus (sputum). Other symptoms may include: A fever. Sweating or chills. Fatigue. Achy muscles. A headache. How is this diagnosed? This condition may be diagnosed based on: Your symptoms. A physical exam. Testing of secretions from the nose or throat. Chest X-ray. How is this treated? This condition may be treated with medicines, such as: Antiviral medicine. This may shorten the length of time a person has symptoms. Expectorants. These make it easier to cough up mucus. Decongestant nasal sprays. Acetaminophen  or NSAIDs, such as ibuprofen , to relieve fever and pain. Antibiotic medicines are not prescribed for viral infections.This is because antibiotics are designed to kill bacteria. They do not kill viruses. Follow these instructions at home: Managing pain and congestion Take over-the-counter and prescription  medicines only as told by your health care provider. If you have a sore throat, gargle with a mixture of salt and water 3-4 times a day or as needed. To make salt water, completely dissolve -1 tsp (3-6 g) of salt in 1 cup (237 mL) of warm water. Use nose drops made from salt water to ease congestion and soften raw skin around your nose. Take 2 tsp (10 mL) of honey at bedtime to lessen coughing at night. Do not give honey to children who are younger than 1 year. Drink enough fluid to keep your urine pale yellow. This helps prevent dehydration and helps loosen up mucus. General instructions  Rest as much as possible. Do not drink alcohol. Do not use any products that contain nicotine or tobacco. These products include cigarettes, chewing tobacco, and vaping devices, such as e-cigarettes. If you need help quitting, ask your health care provider. Keep all follow-up visits. This is important. How is this prevented?     Get an annual flu shot. You may get the flu shot in late summer, fall, or winter. Ask your health care provider when you should get your flu shot. Avoid spreading your infection to other people. If you are sick: Wash your hands with soap and water often, especially after you cough or sneeze. Wash for at least 20 seconds. If soap and water  are not available, use alcohol-based hand sanitizer. Cover your mouth when you cough. Cover your nose and mouth when you sneeze. Do not share cups or eating utensils. Clean commonly used objects often. Clean commonly touched surfaces. Stay home from work or school as told by your health care provider. Avoid contact with people who are sick during cold and flu season. This is generally fall and winter. Contact a health care provider if: Your symptoms last for 10 days or longer. Your symptoms get worse over time. You have severe sinus pain in your face or forehead. The glands in your jaw or neck become very swollen. You have shortness of  breath. Get help right away if you: Feel pain or pressure in your chest. Have trouble breathing. Faint or feel like you will faint. Have severe and persistent vomiting. Feel confused or disoriented. These symptoms may represent a serious problem that is an emergency. Do not wait to see if the symptoms will go away. Get medical help right away. Call your local emergency services (911 in the [place] Do not drive yourself to the hospital. Summary A respiratory infection is an illness that affects part of the respiratory system, such as the lungs, nose, or throat. A respiratory infection that is caused by a virus is called a viral respiratory infection. Common types of viral respiratory infections include a cold, influenza, and respiratory syncytial virus (RSV) infection. Symptoms of this condition include a stuffy or runny nose, cough, fatigue, achy muscles, sore throat, and fevers or chills. Antibiotic medicines are not prescribed for viral infections. This is because antibiotics are designed to kill bacteria. They are not effective against viruses. This information is not intended to replace advice given to you by your health care provider. Make sure you discuss any questions you have with your health care provider. Document Revised: 09/30/2020 Document Reviewed: 09/30/2020 Elsevier Patient Education  2024 [employer]  How to Perform a Sinus Rinse A sinus rinse is a home treatment that is used to rinse your sinuses with a germ-free (sterile) mixture of salt and water (saline solution). Sinuses are air-filled spaces in your skull that are behind the bones of your face and forehead. They open into your nasal cavity. A sinus rinse can help to clear mucus, dirt, dust, or pollen from your nasal cavity. You may do a sinus rinse when you have a cold, a virus, nasal allergy symptoms, a sinus infection, or stuffiness in your nose or sinuses. What are the risks? A sinus rinse is generally safe and  effective. However, there are a few risks, which include: A burning sensation in your sinuses. This may happen if you do not make the saline solution as directed. Be sure to follow all directions when making the saline solution. Nasal irritation. Infection. This may be from unclean supplies or from contaminated water. Infection from contaminated water is rare, but possible. Do not do a sinus rinse if you have had ear or nasal surgery, ear infection, or plugged ears, unless recommended by your health care provider. Supplies needed: Saline solution or powder. Distilled or sterile water to mix with saline powder. You may use boiled and cooled tap water. Boil tap water for 5 minutes; cool until it is lukewarm. Use within 24 hours. Do not use regular tap water to mix with the saline solution. Neti pot or nasal rinse bottle. These supplies release the saline solution into your nose and through your sinuses. Neti pots and nasal rinse bottles can be purchased at  your local pharmacy, a health food store, or online. How to perform a sinus rinse  Wash your hands with soap and water for at least 20 seconds. If soap and water are not available, use hand sanitizer. Wash your device according to the directions that came with the product and then dry it. Use the solution that comes with your product or one that is sold separately in stores. Follow the mixing directions on the package to mix with sterile or distilled water. Fill the device with the amount of saline solution noted in the device instructions. Stand by a sink and tilt your head sideways over the sink. Place the spout of the device in your upper nostril (the one closer to the ceiling). Gently pour or squeeze the saline solution into your nasal cavity. The liquid should drain out from the lower nostril if you are not too congested. While rinsing, breathe through your open mouth. Gently blow your nose to clear any mucus and rinse solution. Blowing too  hard may cause ear pain. Turn your head in the other direction and repeat in your other nostril. Clean and rinse your device with clean water and then air-dry it. Talk with your health care provider or pharmacist if you have questions about how to do a sinus rinse. Summary A sinus rinse is a home treatment that is used to rinse your sinuses with a sterile mixture of salt and water (saline solution). You may do a sinus rinse when you have a cold, a virus, nasal allergy symptoms, a sinus infection, or stuffiness in your nose or sinuses. A sinus rinse is generally safe and effective. Follow all instructions carefully. This information is not intended to replace advice given to you by your health care provider. Make sure you discuss any questions you have with your health care provider. Document Revised: 12/13/2020 Document Reviewed: 12/13/2020 Elsevier Patient Education  2024 [employer].

## 2024-08-04 NOTE — Telephone Encounter (Signed)
 Patient reported all symptoms resolved on Friday 08/01/24 denied new symptoms and reported feels well to return to work tomorrow.  Last BP was elevated at sick visit Thursday 07/31/24.  Discussed with patient see RN Karene one day this week for BP recheck as eye doctor was concerned regarding her blood pressure being elevated on every visit.  Patient reported she had medicine injected into eye last week and next appt in 1 week for next injection.  Employer closed today due to weather.  Patient A&Ox3 spoke full sentences without difficulty respirations even and unlabored no audible wheezing/congestion/cough/throat clearing.  Patient agreed with plan of care and had no further questions at this time.

## 2024-08-05 NOTE — Telephone Encounter (Signed)
 Patient at work feeling well denied concerns A&Ox3 spoke full sentences without difficulty skin warm dry and pink gait sure and steady

## 2024-08-10 ENCOUNTER — Other Ambulatory Visit: Payer: Self-pay | Admitting: Internal Medicine

## 2024-08-10 DIAGNOSIS — E1165 Type 2 diabetes mellitus with hyperglycemia: Secondary | ICD-10-CM

## 2024-08-15 ENCOUNTER — Encounter: Payer: Self-pay | Admitting: Internal Medicine

## 2024-08-15 ENCOUNTER — Ambulatory Visit: Admitting: Internal Medicine

## 2024-08-15 ENCOUNTER — Ambulatory Visit: Payer: Self-pay | Admitting: Internal Medicine

## 2024-08-15 VITALS — BP 130/80 | HR 76 | Temp 98.3°F | Ht 69.0 in | Wt 190.4 lb

## 2024-08-15 DIAGNOSIS — F5101 Primary insomnia: Secondary | ICD-10-CM

## 2024-08-15 DIAGNOSIS — E1169 Type 2 diabetes mellitus with other specified complication: Secondary | ICD-10-CM

## 2024-08-15 DIAGNOSIS — E118 Type 2 diabetes mellitus with unspecified complications: Secondary | ICD-10-CM

## 2024-08-15 DIAGNOSIS — I10 Essential (primary) hypertension: Secondary | ICD-10-CM

## 2024-08-15 DIAGNOSIS — G4700 Insomnia, unspecified: Secondary | ICD-10-CM | POA: Insufficient documentation

## 2024-08-15 LAB — COMPREHENSIVE METABOLIC PANEL WITH GFR
ALT: 17 U/L (ref 3–35)
AST: 20 U/L (ref 5–37)
Albumin: 4.5 g/dL (ref 3.5–5.2)
Alkaline Phosphatase: 45 U/L (ref 39–117)
BUN: 13 mg/dL (ref 6–23)
CO2: 28 meq/L (ref 19–32)
Calcium: 9.5 mg/dL (ref 8.4–10.5)
Chloride: 102 meq/L (ref 96–112)
Creatinine, Ser: 0.7 mg/dL (ref 0.40–1.20)
GFR: 94.1 mL/min
Glucose, Bld: 115 mg/dL — ABNORMAL HIGH (ref 70–99)
Potassium: 3.8 meq/L (ref 3.5–5.1)
Sodium: 137 meq/L (ref 135–145)
Total Bilirubin: 0.5 mg/dL (ref 0.2–1.2)
Total Protein: 7.2 g/dL (ref 6.0–8.3)

## 2024-08-15 LAB — LIPID PANEL
Cholesterol: 171 mg/dL (ref 28–200)
HDL: 63.1 mg/dL
LDL Cholesterol: 92 mg/dL (ref 10–99)
NonHDL: 108.2
Total CHOL/HDL Ratio: 3
Triglycerides: 81 mg/dL (ref 10.0–149.0)
VLDL: 16.2 mg/dL (ref 0.0–40.0)

## 2024-08-15 LAB — POCT GLYCOSYLATED HEMOGLOBIN (HGB A1C): Hemoglobin A1C: 6.1 % — AB (ref 4.0–5.6)

## 2024-08-15 LAB — CBC
HCT: 39.9 % (ref 36.0–46.0)
Hemoglobin: 13.6 g/dL (ref 12.0–15.0)
MCHC: 34.1 g/dL (ref 30.0–36.0)
MCV: 89 fl (ref 78.0–100.0)
Platelets: 251 10*3/uL (ref 150.0–400.0)
RBC: 4.49 Mil/uL (ref 3.87–5.11)
RDW: 14 % (ref 11.5–15.5)
WBC: 5.6 10*3/uL (ref 4.0–10.5)

## 2024-08-15 MED ORDER — TRAZODONE HCL 50 MG PO TABS: 25.0000 mg | ORAL_TABLET | Freq: Every evening | ORAL | 3 refills | Status: AC | PRN

## 2024-08-15 MED ORDER — LOSARTAN POTASSIUM 100 MG PO TABS: 100.0000 mg | ORAL_TABLET | Freq: Every day | ORAL | 3 refills | Status: AC

## 2024-08-15 NOTE — Patient Instructions (Signed)
 We have sent in the losartan  100 mg daily to increase the dose slightly. It is okay if you have some leftover to take 2 pills of the 50 mg daily until that is used up.

## 2024-08-15 NOTE — Progress Notes (Signed)
 "  Subjective:   Patient ID: Kelly Reed, female    DOB: 1965-09-24, 59 y.o.   MRN: [id number]  Discussed the use of AI scribe software for clinical note transcription with the patient, who gave verbal consent to proceed.  History of Present Illness Kelly Reed is a 59 year old female with hypertension and diabetes who presents with concerns about her blood pressure and vision changes.  She has been experiencing vision changes, particularly floaters, since June, which become more noticeable when bending over or squatting at work. An abnormality was noticed on an x-ray during a visit to the eye doctor in November, leading to her receiving eye injections in her right eye in December and January. She is scheduled for another injection next week.  Her blood pressure readings have been around 130/80, with an instance of 132/92 when she was sick. She takes a low dose of losartan  and believes an increase might be needed to maintain better control, especially on bad days.  Her most recent A1c is 6.1%. She is on Rybelsus  and has experienced some weight loss, which she attributes to a variable appetite and difficulty in consuming enough protein. Her weight has decreased by four pounds since her last visit.  She has been taking trazodone  for sleep disturbances and requires a refill. Her appetite fluctuates, and she experiences hot flashes, which have persisted for a long time, affecting her quality of life.  She participates in yearly screenings at work for insurance purposes, which include checks for blood pressure, diabetes, and cholesterol. She finds it challenging to coordinate these screenings with her work schedule. She reports needing glasses adjustments and experiencing floaters. She reports having been sick previously.  Review of Systems  Constitutional: Negative.   HENT: Negative.    Eyes: Negative.   Respiratory:  Negative for cough, chest tightness and shortness of breath.    Cardiovascular:  Negative for chest pain, palpitations and leg swelling.  Gastrointestinal:  Negative for abdominal distention, abdominal pain, constipation, diarrhea, nausea and vomiting.  Musculoskeletal: Negative.   Skin: Negative.   Neurological: Negative.   Psychiatric/Behavioral:  Positive for sleep disturbance.     Objective:  Physical Exam Constitutional:      Appearance: She is well-developed.  HENT:     Head: Normocephalic and atraumatic.  Cardiovascular:     Rate and Rhythm: Normal rate and regular rhythm.  Pulmonary:     Effort: Pulmonary effort is normal. No respiratory distress.     Breath sounds: Normal breath sounds. No wheezing or rales.  Abdominal:     General: Bowel sounds are normal. There is no distension.     Palpations: Abdomen is soft.     Tenderness: There is no abdominal tenderness.  Musculoskeletal:     Cervical back: Normal range of motion.  Skin:    General: Skin is warm and dry.  Neurological:     Mental Status: She is alert and oriented to person, place, and time.     Coordination: Coordination normal.     Vitals:   08/15/24 0753  BP: 130/80  Pulse: 76  Temp: 98.3 F (36.8 C)  TempSrc: Oral  SpO2: 99%  Weight: 190 lb 6.4 oz (86.4 kg)  Height: 5' 9 (1.753 m)    Assessment and Plan Assessment & Plan Hypertension   Blood pressure is controlled with losartan , though occasional elevations occur. An eye specialist recommended management for eye health. Increase losartan  to 100 mg daily to target 120/70 mmHg. She is  instructed to chart home blood pressure readings.  Type 2 diabetes mellitus with complication Diabetes is well-controlled with an A1c of 6.1% checked today with POC in office. Weight loss is noted, attributed to Rybelsus  and diet. Continue current diabetes management and encourage dietary efforts for weight maintenance and diabetes control.  insomnia   She is having more sleep disturbances and we will rx trazodone  for sleep  and counseled about use.   Hyperlipidemia associated with type 2 diabetes Checking lipid panel and adjust as needed.    "

## 2024-08-20 ENCOUNTER — Encounter (INDEPENDENT_AMBULATORY_CARE_PROVIDER_SITE_OTHER): Admitting: Ophthalmology

## 2025-02-13 ENCOUNTER — Ambulatory Visit: Admitting: Internal Medicine
